# Patient Record
Sex: Male | Born: 1980 | Race: Black or African American | Hispanic: No | Marital: Single | Smoking: Current every day smoker
Health system: Southern US, Community
[De-identification: ages and names within clinical notes are randomized; demographics above are authoritative.]

## PROBLEM LIST (undated history)

## (undated) DIAGNOSIS — K219 Gastro-esophageal reflux disease without esophagitis: Secondary | ICD-10-CM

## (undated) DIAGNOSIS — Z21 Asymptomatic human immunodeficiency virus [HIV] infection status: Secondary | ICD-10-CM

## (undated) DIAGNOSIS — F419 Anxiety disorder, unspecified: Secondary | ICD-10-CM

## (undated) DIAGNOSIS — F32A Depression, unspecified: Secondary | ICD-10-CM

## (undated) DIAGNOSIS — B20 Human immunodeficiency virus [HIV] disease: Secondary | ICD-10-CM

## (undated) DIAGNOSIS — R519 Headache, unspecified: Secondary | ICD-10-CM

## (undated) DIAGNOSIS — F329 Major depressive disorder, single episode, unspecified: Secondary | ICD-10-CM

## (undated) DIAGNOSIS — D649 Anemia, unspecified: Secondary | ICD-10-CM

## (undated) DIAGNOSIS — F1910 Other psychoactive substance abuse, uncomplicated: Secondary | ICD-10-CM

## (undated) HISTORY — DX: Depression, unspecified: F32.A

## (undated) HISTORY — DX: Major depressive disorder, single episode, unspecified: F32.9

## (undated) HISTORY — DX: Headache, unspecified: R51.9

## (undated) HISTORY — DX: Asymptomatic human immunodeficiency virus (hiv) infection status: Z21

## (undated) HISTORY — DX: Other psychoactive substance abuse, uncomplicated: F19.10

## (undated) HISTORY — DX: Gastro-esophageal reflux disease without esophagitis: K21.9

## (undated) HISTORY — DX: Human immunodeficiency virus (HIV) disease: B20

## (undated) HISTORY — DX: Anxiety disorder, unspecified: F41.9

## (undated) HISTORY — DX: Anemia, unspecified: D64.9

## (undated) HISTORY — PX: TOOTH EXTRACTION: SUR596

---
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
## 2003-12-04 ENCOUNTER — Emergency Department (HOSPITAL_COMMUNITY): Admission: EM | Admit: 2003-12-04 | Discharge: 2003-12-05 | Payer: Self-pay | Admitting: Emergency Medicine

## 2003-12-05 IMAGING — CT CT ABDOMEN W/ CM
1 series · 16 of 32 positions shown, 20 images · IV contrast (GASTRO & 100 CC OMNI 300)
Comparison: None.

CLINICAL DATA: Lower abdominal pain.  
 CT OF THE ABDOMEN AND PELVIS WITH CONTRAST ? [DATE], [2S] HOURS
TECHNIQUE: After the intravenous injection of 100 cc Omnipaque 300, 5-mm spiral images were obtained through the abdomen and pelvis.
 CT ABDOMEN

[Series 2: abd pelvis · axial · 0.64mm/px · z∈[-479,-69]mm · 16 of 118 slices shown, 20 images]
[im 8/118  soft-tissue]
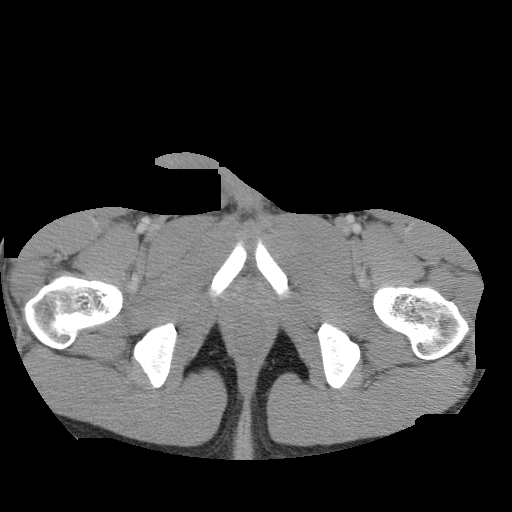
[im 8/118  bone]
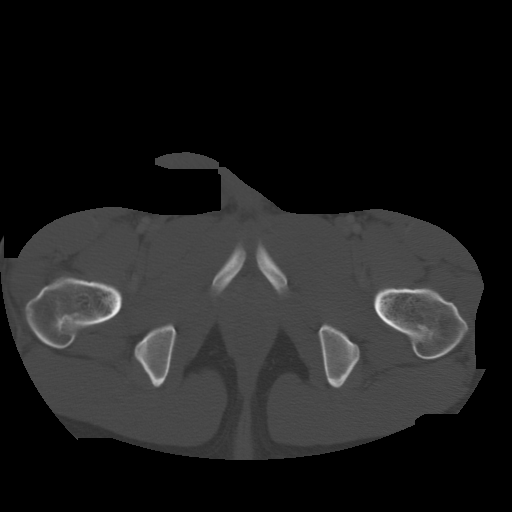
[im 16/118  soft-tissue]
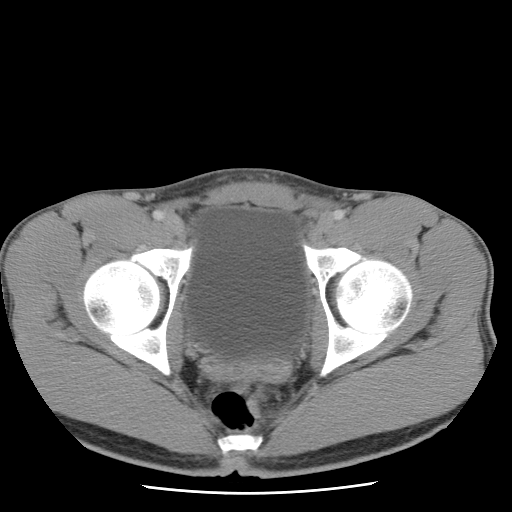
[im 23/118  soft-tissue]
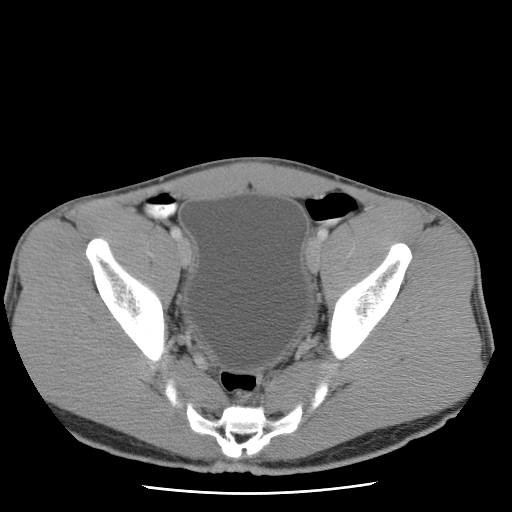
[im 31/118  soft-tissue]
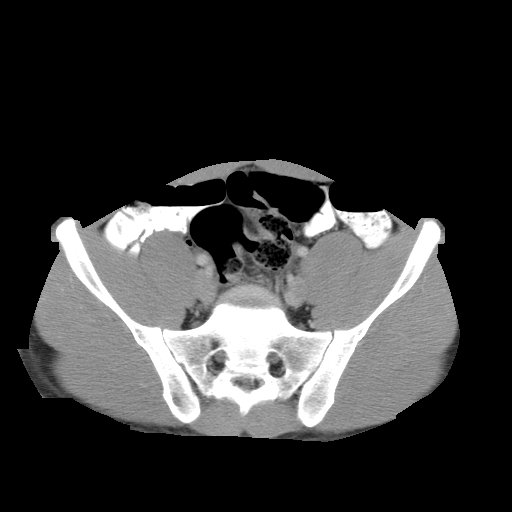
[im 38/118  soft-tissue]
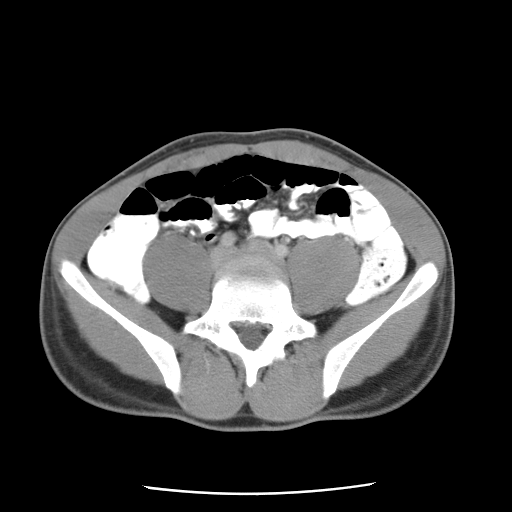
[im 46/118  soft-tissue]
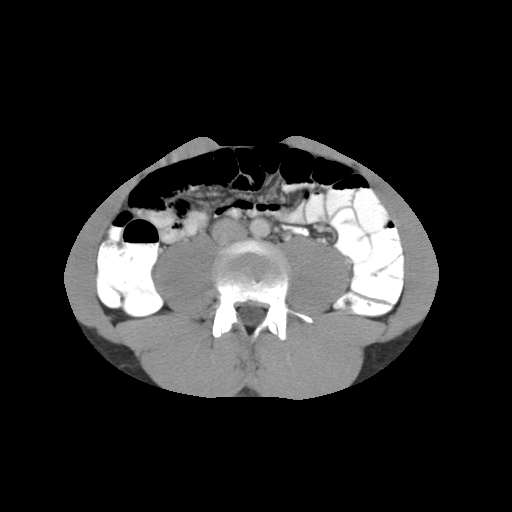
[im 53/118  soft-tissue]
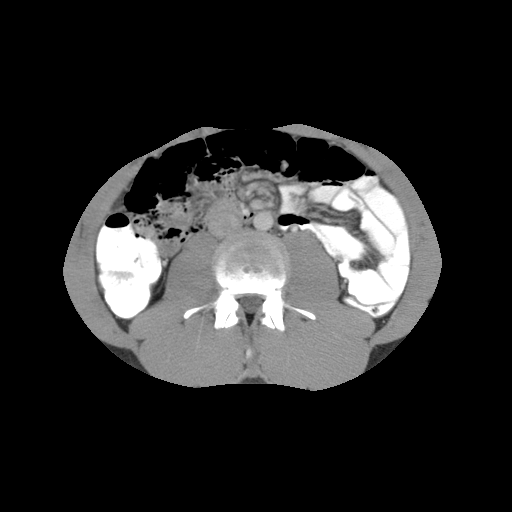
[im 65/118  soft-tissue]
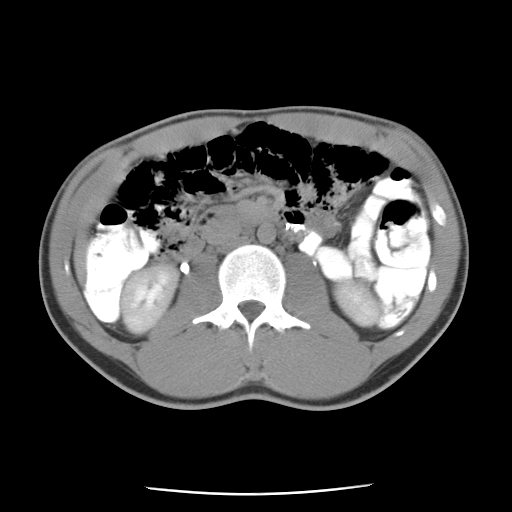
[im 72/118  soft-tissue]
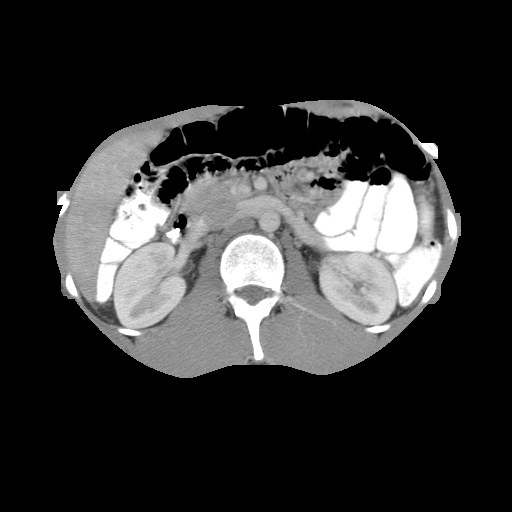
[im 72/118  bone]
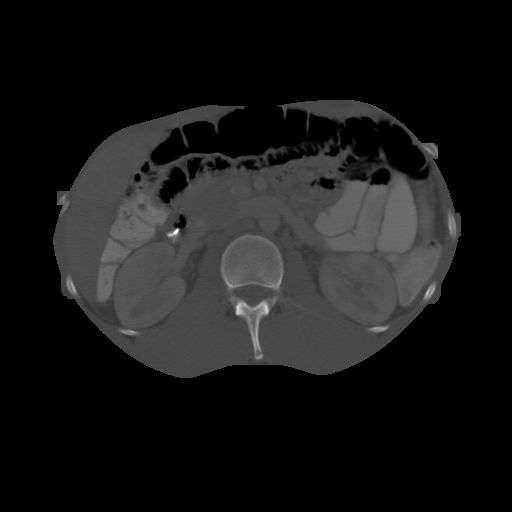
[im 80/118  soft-tissue]
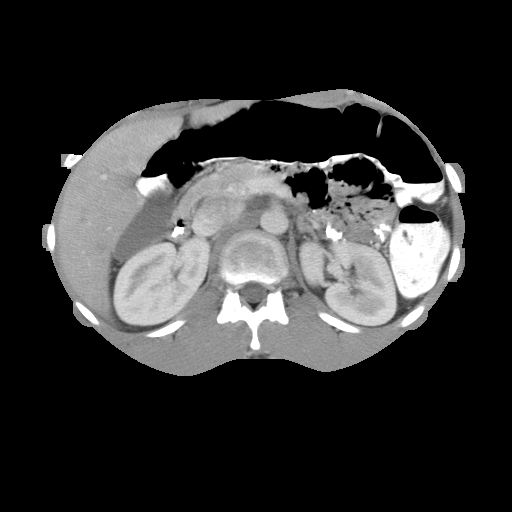
[im 87/118  soft-tissue]
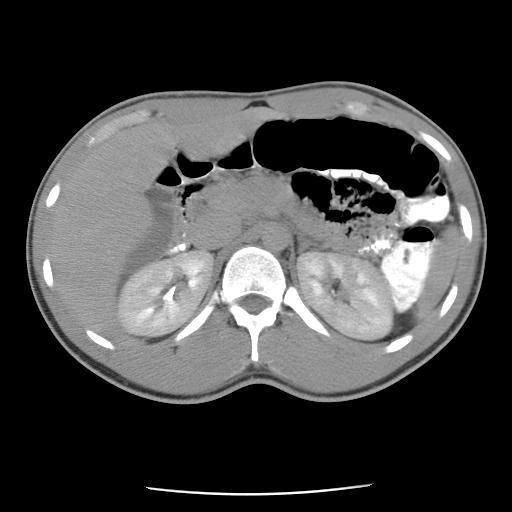
[im 95/118  soft-tissue]
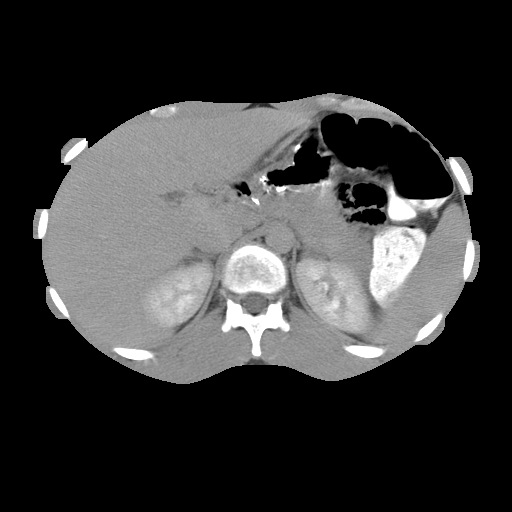
[im 102/118  soft-tissue]
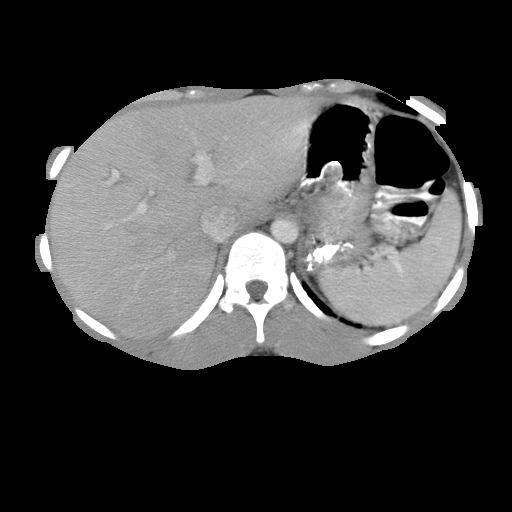
[im 102/118  lung]
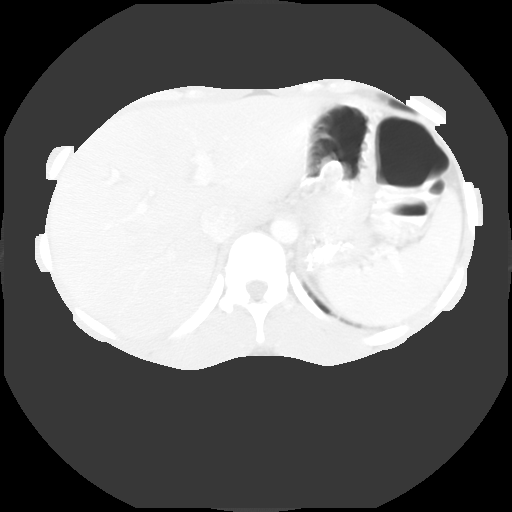
[im 106/118  lung]
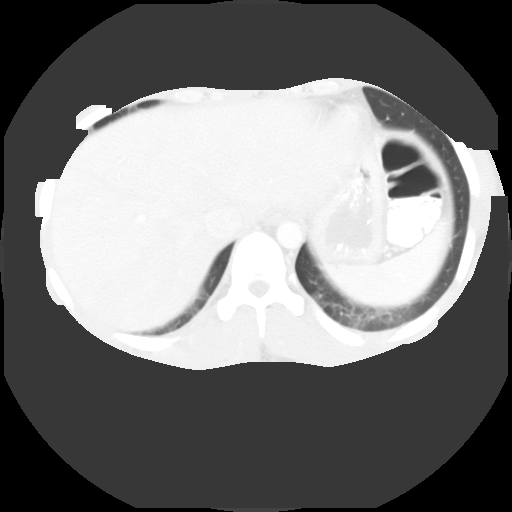
[im 110/118  soft-tissue]
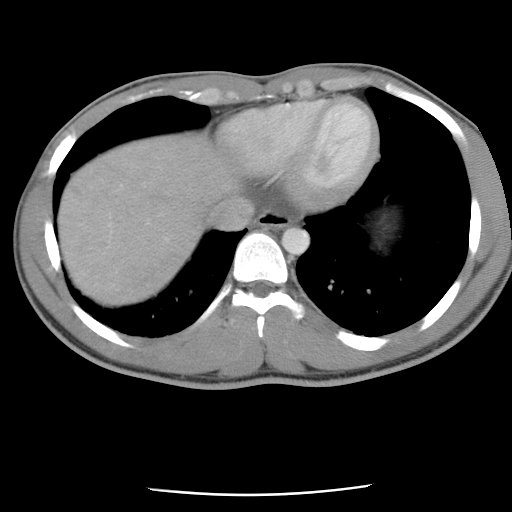
[im 110/118  lung]
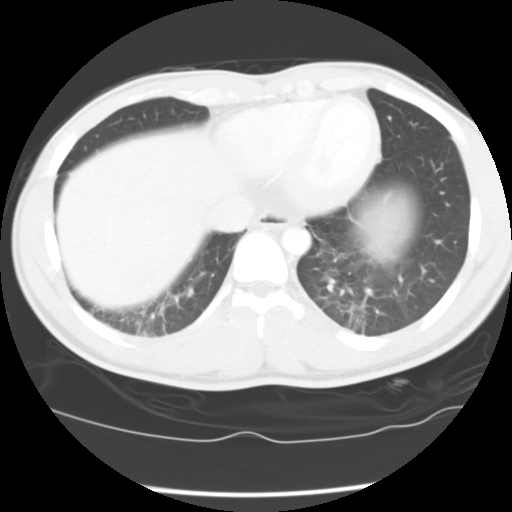
[im 114/118  lung]
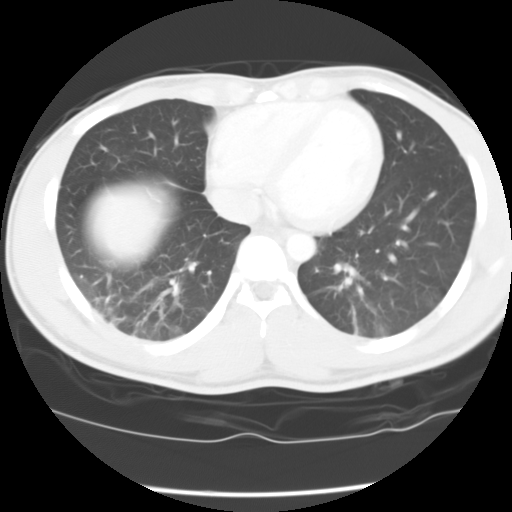

[16 of 32 positions shown; findings below may reference images not displayed]

FINDINGS: The liver, gallbladder, spleen, pancreas, kidneys and adrenal glands are within normal limits.  Mild distention of small and large bowel is present.  The appendix is normal.  Mild stool is noted in the distal descending colon.
IMPRESSION: Ileus.
 CT PELVIS 
 In the pelvis, the bladder is noted to be moderately distended.  A small amount of free fluid is noted in the dependent portion of the pelvis, nonspecific.  Negative abnormal adenopathy.
IMPRESSION: Bladder distention.
 Small amount of free fluid.  Nonspecific.

## 2005-02-03 ENCOUNTER — Emergency Department (HOSPITAL_COMMUNITY): Admission: EM | Admit: 2005-02-03 | Discharge: 2005-02-03 | Payer: Self-pay | Admitting: Emergency Medicine

## 2005-06-06 ENCOUNTER — Emergency Department (HOSPITAL_COMMUNITY): Admission: EM | Admit: 2005-06-06 | Discharge: 2005-06-06 | Payer: Self-pay | Admitting: Family Medicine

## 2006-09-16 ENCOUNTER — Emergency Department (HOSPITAL_COMMUNITY): Admission: EM | Admit: 2006-09-16 | Discharge: 2006-09-16 | Payer: Self-pay | Admitting: Emergency Medicine

## 2006-11-12 ENCOUNTER — Emergency Department (HOSPITAL_COMMUNITY): Admission: EM | Admit: 2006-11-12 | Discharge: 2006-11-12 | Payer: Self-pay | Admitting: Emergency Medicine

## 2007-01-06 ENCOUNTER — Emergency Department (HOSPITAL_COMMUNITY): Admission: EM | Admit: 2007-01-06 | Discharge: 2007-01-06 | Payer: Self-pay | Admitting: Emergency Medicine

## 2007-01-09 ENCOUNTER — Encounter: Payer: Self-pay | Admitting: Internal Medicine

## 2007-01-09 ENCOUNTER — Emergency Department (HOSPITAL_COMMUNITY): Admission: EM | Admit: 2007-01-09 | Discharge: 2007-01-09 | Payer: Self-pay | Admitting: Emergency Medicine

## 2007-02-13 DIAGNOSIS — R599 Enlarged lymph nodes, unspecified: Secondary | ICD-10-CM | POA: Insufficient documentation

## 2007-02-13 DIAGNOSIS — B20 Human immunodeficiency virus [HIV] disease: Secondary | ICD-10-CM | POA: Insufficient documentation

## 2007-02-14 ENCOUNTER — Encounter: Payer: Self-pay | Admitting: Licensed Clinical Social Worker

## 2007-02-14 ENCOUNTER — Encounter: Admission: RE | Admit: 2007-02-14 | Discharge: 2007-02-14 | Payer: Self-pay | Admitting: Internal Medicine

## 2007-02-14 ENCOUNTER — Ambulatory Visit: Payer: Self-pay | Admitting: Internal Medicine

## 2007-02-14 DIAGNOSIS — F341 Dysthymic disorder: Secondary | ICD-10-CM

## 2007-02-14 LAB — CONVERTED CEMR LAB
Basophils Absolute: 0.1 10*3/uL (ref 0.0–0.1)
Basophils Relative: 3 % — ABNORMAL HIGH (ref 0–1)
CD4 T Cell Abs: 210
Chlamydia, Swab/Urine, PCR: NEGATIVE
Eosinophils Absolute: 0.1 10*3/uL (ref 0.0–0.7)
Eosinophils Relative: 2 % (ref 0–5)
GC Probe Amp, Urine: NEGATIVE
HCT: 45 % (ref 39.0–52.0)
HIV 1 RNA Quant: 119000 copies/mL — ABNORMAL HIGH (ref <50)
HIV-1 RNA Quant, Log: 5.08 — ABNORMAL HIGH (ref <1.70)
Hemoglobin: 15 g/dL (ref 13.0–17.0)
Lymphocytes Relative: 48 % — ABNORMAL HIGH (ref 12–46)
Lymphs Abs: 2.4 10*3/uL (ref 0.7–3.3)
MCHC: 33.3 g/dL (ref 30.0–36.0)
MCV: 94.7 fL (ref 78.0–100.0)
Monocytes Absolute: 0.5 10*3/uL (ref 0.2–0.7)
Monocytes Relative: 9 % (ref 3–11)
Neutro Abs: 1.9 10*3/uL (ref 1.7–7.7)
Neutrophils Relative %: 38 % — ABNORMAL LOW (ref 43–77)
Platelets: 267 10*3/uL (ref 150–400)
RBC: 4.75 M/uL (ref 4.22–5.81)
RDW: 14.2 % — ABNORMAL HIGH (ref 11.5–14.0)
WBC: 4.9 10*3/uL (ref 4.0–10.5)

## 2007-02-28 ENCOUNTER — Ambulatory Visit: Payer: Self-pay | Admitting: Internal Medicine

## 2007-02-28 ENCOUNTER — Encounter (INDEPENDENT_AMBULATORY_CARE_PROVIDER_SITE_OTHER): Payer: Self-pay | Admitting: *Deleted

## 2007-03-12 ENCOUNTER — Encounter (INDEPENDENT_AMBULATORY_CARE_PROVIDER_SITE_OTHER): Payer: Self-pay | Admitting: *Deleted

## 2007-03-14 ENCOUNTER — Telehealth (INDEPENDENT_AMBULATORY_CARE_PROVIDER_SITE_OTHER): Payer: Self-pay | Admitting: *Deleted

## 2007-04-10 ENCOUNTER — Telehealth: Payer: Self-pay | Admitting: Internal Medicine

## 2007-04-10 ENCOUNTER — Ambulatory Visit: Payer: Self-pay | Admitting: Internal Medicine

## 2007-04-10 ENCOUNTER — Encounter: Admission: RE | Admit: 2007-04-10 | Discharge: 2007-04-10 | Payer: Self-pay | Admitting: Internal Medicine

## 2007-04-10 LAB — CONVERTED CEMR LAB
ALT: 21 units/L (ref 0–53)
AST: 29 units/L (ref 0–37)
Albumin: 4.6 g/dL (ref 3.5–5.2)
Alkaline Phosphatase: 62 units/L (ref 39–117)
BUN: 8 mg/dL (ref 6–23)
Basophils Absolute: 0 10*3/uL (ref 0.0–0.1)
Basophils Relative: 1 % (ref 0–1)
CO2: 25 meq/L (ref 19–32)
Calcium: 9.8 mg/dL (ref 8.4–10.5)
Chloride: 103 meq/L (ref 96–112)
Creatinine, Ser: 0.98 mg/dL (ref 0.40–1.50)
Eosinophils Absolute: 0.1 10*3/uL (ref 0.0–0.7)
Eosinophils Relative: 2 % (ref 0–5)
Glucose, Bld: 80 mg/dL (ref 70–99)
HCT: 44.9 % (ref 39.0–52.0)
HIV 1 RNA Quant: 158 copies/mL — ABNORMAL HIGH (ref <50)
HIV-1 RNA Quant, Log: 2.2 — ABNORMAL HIGH (ref <1.70)
Hemoglobin: 15.3 g/dL (ref 13.0–17.0)
Lymphocytes Relative: 48 % — ABNORMAL HIGH (ref 12–46)
Lymphs Abs: 1.9 10*3/uL (ref 0.7–3.3)
MCHC: 34.1 g/dL (ref 30.0–36.0)
MCV: 93.7 fL (ref 78.0–100.0)
Monocytes Absolute: 0.4 10*3/uL (ref 0.2–0.7)
Monocytes Relative: 9 % (ref 3–11)
Neutro Abs: 1.6 10*3/uL — ABNORMAL LOW (ref 1.7–7.7)
Neutrophils Relative %: 41 % — ABNORMAL LOW (ref 43–77)
Platelets: 278 10*3/uL (ref 150–400)
Potassium: 4.8 meq/L (ref 3.5–5.3)
RBC: 4.79 M/uL (ref 4.22–5.81)
RDW: 14.8 % — ABNORMAL HIGH (ref 11.5–14.0)
Sodium: 138 meq/L (ref 135–145)
Total Bilirubin: 0.4 mg/dL (ref 0.3–1.2)
Total Protein: 8.6 g/dL — ABNORMAL HIGH (ref 6.0–8.3)
WBC: 4 10*3/uL (ref 4.0–10.5)

## 2007-04-11 ENCOUNTER — Encounter (INDEPENDENT_AMBULATORY_CARE_PROVIDER_SITE_OTHER): Payer: Self-pay | Admitting: *Deleted

## 2007-04-11 ENCOUNTER — Encounter: Payer: Self-pay | Admitting: Internal Medicine

## 2007-04-11 LAB — CONVERTED CEMR LAB: CD4 Count: 260 microliters

## 2007-05-01 ENCOUNTER — Encounter (INDEPENDENT_AMBULATORY_CARE_PROVIDER_SITE_OTHER): Payer: Self-pay | Admitting: *Deleted

## 2007-05-01 ENCOUNTER — Ambulatory Visit: Payer: Self-pay | Admitting: Internal Medicine

## 2007-05-01 DIAGNOSIS — R634 Abnormal weight loss: Secondary | ICD-10-CM

## 2007-05-01 DIAGNOSIS — A630 Anogenital (venereal) warts: Secondary | ICD-10-CM

## 2007-05-01 LAB — CONVERTED CEMR LAB
HCV Ab: NEGATIVE
Hep B S Ab: NEGATIVE
Hepatitis B Surface Ag: NEGATIVE

## 2007-05-03 ENCOUNTER — Telehealth: Payer: Self-pay | Admitting: Internal Medicine

## 2007-05-09 ENCOUNTER — Telehealth: Payer: Self-pay | Admitting: Internal Medicine

## 2007-05-24 ENCOUNTER — Encounter: Payer: Self-pay | Admitting: Internal Medicine

## 2007-05-31 ENCOUNTER — Telehealth: Payer: Self-pay | Admitting: Internal Medicine

## 2007-06-07 ENCOUNTER — Telehealth: Payer: Self-pay | Admitting: Internal Medicine

## 2007-06-14 ENCOUNTER — Telehealth: Payer: Self-pay | Admitting: Internal Medicine

## 2007-06-27 ENCOUNTER — Telehealth: Payer: Self-pay

## 2007-07-03 ENCOUNTER — Telehealth: Payer: Self-pay | Admitting: Internal Medicine

## 2007-07-04 ENCOUNTER — Telehealth: Payer: Self-pay

## 2007-07-04 ENCOUNTER — Telehealth: Payer: Self-pay | Admitting: Internal Medicine

## 2007-07-10 ENCOUNTER — Emergency Department (HOSPITAL_COMMUNITY): Admission: EM | Admit: 2007-07-10 | Discharge: 2007-07-10 | Payer: Self-pay | Admitting: Emergency Medicine

## 2007-07-27 ENCOUNTER — Encounter: Payer: Self-pay | Admitting: Internal Medicine

## 2007-07-27 ENCOUNTER — Telehealth: Payer: Self-pay | Admitting: Internal Medicine

## 2007-08-06 ENCOUNTER — Telehealth: Payer: Self-pay | Admitting: Internal Medicine

## 2007-08-07 ENCOUNTER — Encounter: Admission: RE | Admit: 2007-08-07 | Discharge: 2007-08-07 | Payer: Self-pay | Admitting: Internal Medicine

## 2007-08-07 ENCOUNTER — Ambulatory Visit: Payer: Self-pay | Admitting: Internal Medicine

## 2007-08-07 LAB — CONVERTED CEMR LAB
ALT: 10 units/L (ref 0–53)
AST: 16 units/L (ref 0–37)
Albumin: 4.2 g/dL (ref 3.5–5.2)
Alkaline Phosphatase: 63 units/L (ref 39–117)
BUN: 4 mg/dL — ABNORMAL LOW (ref 6–23)
Basophils Absolute: 0 10*3/uL (ref 0.0–0.1)
Basophils Relative: 1 % (ref 0–1)
CO2: 24 meq/L (ref 19–32)
Calcium: 9.2 mg/dL (ref 8.4–10.5)
Chloride: 105 meq/L (ref 96–112)
Creatinine, Ser: 1.05 mg/dL (ref 0.40–1.50)
Eosinophils Absolute: 0.2 10*3/uL (ref 0.2–0.7)
Eosinophils Relative: 5 % (ref 0–5)
Glucose, Bld: 100 mg/dL — ABNORMAL HIGH (ref 70–99)
HCT: 43.3 % (ref 39.0–52.0)
HIV 1 RNA Quant: 57 copies/mL — ABNORMAL HIGH (ref <50)
HIV-1 RNA Quant, Log: 1.76 — ABNORMAL HIGH (ref <1.70)
Hemoglobin: 14.8 g/dL (ref 13.0–17.0)
Lymphocytes Relative: 48 % — ABNORMAL HIGH (ref 12–46)
Lymphs Abs: 2 10*3/uL (ref 0.7–4.0)
MCHC: 34.2 g/dL (ref 30.0–36.0)
MCV: 101.6 fL — ABNORMAL HIGH (ref 78.0–100.0)
Monocytes Absolute: 0.4 10*3/uL (ref 0.1–1.0)
Monocytes Relative: 9 % (ref 3–12)
Neutro Abs: 1.6 10*3/uL — ABNORMAL LOW (ref 1.7–7.7)
Neutrophils Relative %: 38 % — ABNORMAL LOW (ref 43–77)
Platelets: 280 10*3/uL (ref 150–400)
Potassium: 4.5 meq/L (ref 3.5–5.3)
RBC: 4.26 M/uL (ref 4.22–5.81)
RDW: 14.1 % (ref 11.5–15.5)
Sodium: 137 meq/L (ref 135–145)
Total Bilirubin: 0.3 mg/dL (ref 0.3–1.2)
Total Protein: 7.5 g/dL (ref 6.0–8.3)
WBC: 4.2 10*3/uL (ref 4.0–10.5)

## 2007-08-13 ENCOUNTER — Telehealth: Payer: Self-pay | Admitting: Internal Medicine

## 2007-08-22 ENCOUNTER — Ambulatory Visit: Payer: Self-pay | Admitting: Internal Medicine

## 2007-08-22 DIAGNOSIS — S335XXA Sprain of ligaments of lumbar spine, initial encounter: Secondary | ICD-10-CM

## 2007-08-29 ENCOUNTER — Telehealth: Payer: Self-pay | Admitting: Internal Medicine

## 2007-09-24 ENCOUNTER — Encounter: Payer: Self-pay | Admitting: Internal Medicine

## 2007-09-26 ENCOUNTER — Encounter (INDEPENDENT_AMBULATORY_CARE_PROVIDER_SITE_OTHER): Payer: Self-pay | Admitting: *Deleted

## 2007-10-05 ENCOUNTER — Telehealth: Payer: Self-pay | Admitting: Internal Medicine

## 2007-11-01 ENCOUNTER — Telehealth: Payer: Self-pay | Admitting: Internal Medicine

## 2007-11-02 ENCOUNTER — Encounter: Admission: RE | Admit: 2007-11-02 | Discharge: 2007-11-02 | Payer: Self-pay | Admitting: Internal Medicine

## 2007-11-02 ENCOUNTER — Ambulatory Visit: Payer: Self-pay | Admitting: Internal Medicine

## 2007-11-02 ENCOUNTER — Telehealth: Payer: Self-pay | Admitting: Internal Medicine

## 2007-11-02 ENCOUNTER — Encounter (INDEPENDENT_AMBULATORY_CARE_PROVIDER_SITE_OTHER): Payer: Self-pay | Admitting: *Deleted

## 2007-11-02 LAB — CONVERTED CEMR LAB
ALT: 9 units/L (ref 0–53)
AST: 13 units/L (ref 0–37)
Albumin: 4.5 g/dL (ref 3.5–5.2)
Alkaline Phosphatase: 77 units/L (ref 39–117)
BUN: 6 mg/dL (ref 6–23)
Basophils Absolute: 0 10*3/uL (ref 0.0–0.1)
Basophils Relative: 0 % (ref 0–1)
CO2: 25 meq/L (ref 19–32)
Calcium: 9.6 mg/dL (ref 8.4–10.5)
Chloride: 104 meq/L (ref 96–112)
Creatinine, Ser: 0.96 mg/dL (ref 0.40–1.50)
Eosinophils Absolute: 0.3 10*3/uL (ref 0.0–0.7)
Eosinophils Relative: 4 % (ref 0–5)
Glucose, Bld: 102 mg/dL — ABNORMAL HIGH (ref 70–99)
HCT: 46.4 % (ref 39.0–52.0)
HIV 1 RNA Quant: 193 copies/mL — ABNORMAL HIGH (ref <50)
HIV-1 RNA Quant, Log: 2.29 — ABNORMAL HIGH (ref <1.70)
Hemoglobin: 16 g/dL (ref 13.0–17.0)
Lymphocytes Relative: 31 % (ref 12–46)
Lymphs Abs: 1.8 10*3/uL (ref 0.7–4.0)
MCHC: 34.5 g/dL (ref 30.0–36.0)
MCV: 100.9 fL — ABNORMAL HIGH (ref 78.0–100.0)
Monocytes Absolute: 0.3 10*3/uL (ref 0.1–1.0)
Monocytes Relative: 5 % (ref 3–12)
Neutro Abs: 3.6 10*3/uL (ref 1.7–7.7)
Neutrophils Relative %: 59 % (ref 43–77)
Platelets: 341 10*3/uL (ref 150–400)
Potassium: 4.6 meq/L (ref 3.5–5.3)
RBC: 4.6 M/uL (ref 4.22–5.81)
RDW: 14.4 % (ref 11.5–15.5)
Sodium: 140 meq/L (ref 135–145)
Total Bilirubin: 0.3 mg/dL (ref 0.3–1.2)
Total Protein: 7.9 g/dL (ref 6.0–8.3)
WBC: 6 10*3/uL (ref 4.0–10.5)

## 2007-11-13 ENCOUNTER — Encounter (INDEPENDENT_AMBULATORY_CARE_PROVIDER_SITE_OTHER): Payer: Self-pay | Admitting: *Deleted

## 2007-11-14 ENCOUNTER — Ambulatory Visit: Payer: Self-pay | Admitting: Internal Medicine

## 2007-11-14 DIAGNOSIS — F17200 Nicotine dependence, unspecified, uncomplicated: Secondary | ICD-10-CM | POA: Insufficient documentation

## 2007-11-20 ENCOUNTER — Telehealth: Payer: Self-pay | Admitting: Internal Medicine

## 2007-11-20 ENCOUNTER — Telehealth (INDEPENDENT_AMBULATORY_CARE_PROVIDER_SITE_OTHER): Payer: Self-pay | Admitting: *Deleted

## 2007-11-28 ENCOUNTER — Telehealth: Payer: Self-pay | Admitting: Internal Medicine

## 2007-12-17 ENCOUNTER — Telehealth: Payer: Self-pay | Admitting: Internal Medicine

## 2007-12-17 ENCOUNTER — Emergency Department (HOSPITAL_COMMUNITY): Admission: EM | Admit: 2007-12-17 | Discharge: 2007-12-17 | Payer: Self-pay | Admitting: Emergency Medicine

## 2007-12-28 ENCOUNTER — Telehealth (INDEPENDENT_AMBULATORY_CARE_PROVIDER_SITE_OTHER): Payer: Self-pay | Admitting: *Deleted

## 2008-01-29 ENCOUNTER — Telehealth (INDEPENDENT_AMBULATORY_CARE_PROVIDER_SITE_OTHER): Payer: Self-pay | Admitting: *Deleted

## 2008-01-30 ENCOUNTER — Ambulatory Visit: Payer: Self-pay | Admitting: Internal Medicine

## 2008-01-30 ENCOUNTER — Encounter: Admission: RE | Admit: 2008-01-30 | Discharge: 2008-01-30 | Payer: Self-pay | Admitting: Internal Medicine

## 2008-01-30 LAB — CONVERTED CEMR LAB
ALT: <8 units/L (ref 0–53)
AST: 11 units/L (ref 0–37)
Albumin: 4 g/dL (ref 3.5–5.2)
Alkaline Phosphatase: 85 units/L (ref 39–117)
BUN: 6 mg/dL (ref 6–23)
Basophils Absolute: 0 10*3/uL (ref 0.0–0.1)
Basophils Relative: 0 % (ref 0–1)
CO2: 21 meq/L (ref 19–32)
Calcium: 8.9 mg/dL (ref 8.4–10.5)
Chloride: 104 meq/L (ref 96–112)
Creatinine, Ser: 0.92 mg/dL (ref 0.40–1.50)
Eosinophils Absolute: 0.3 10*3/uL (ref 0.0–0.7)
Eosinophils Relative: 4 % (ref 0–5)
Glucose, Bld: 94 mg/dL (ref 70–99)
HCT: 42.1 % (ref 39.0–52.0)
HIV 1 RNA Quant: <50 copies/mL (ref <50)
HIV-1 RNA Quant, Log: <1.7 (ref <1.70)
Hemoglobin: 14.2 g/dL (ref 13.0–17.0)
Lymphocytes Relative: 26 % (ref 12–46)
Lymphs Abs: 2 10*3/uL (ref 0.7–4.0)
MCHC: 33.7 g/dL (ref 30.0–36.0)
MCV: 100.7 fL — ABNORMAL HIGH (ref 78.0–100.0)
Monocytes Absolute: 0.5 10*3/uL (ref 0.1–1.0)
Monocytes Relative: 6 % (ref 3–12)
Neutro Abs: 4.9 10*3/uL (ref 1.7–7.7)
Neutrophils Relative %: 63 % (ref 43–77)
Platelets: 350 10*3/uL (ref 150–400)
Potassium: 4.3 meq/L (ref 3.5–5.3)
RBC: 4.18 M/uL — ABNORMAL LOW (ref 4.22–5.81)
RDW: 14.5 % (ref 11.5–15.5)
Sodium: 139 meq/L (ref 135–145)
Total Bilirubin: 0.3 mg/dL (ref 0.3–1.2)
Total Protein: 6.9 g/dL (ref 6.0–8.3)
WBC: 7.7 10*3/uL (ref 4.0–10.5)

## 2008-02-13 ENCOUNTER — Ambulatory Visit: Payer: Self-pay | Admitting: Internal Medicine

## 2008-02-13 DIAGNOSIS — K029 Dental caries, unspecified: Secondary | ICD-10-CM | POA: Insufficient documentation

## 2008-02-14 ENCOUNTER — Telehealth (INDEPENDENT_AMBULATORY_CARE_PROVIDER_SITE_OTHER): Payer: Self-pay | Admitting: *Deleted

## 2008-02-27 ENCOUNTER — Telehealth (INDEPENDENT_AMBULATORY_CARE_PROVIDER_SITE_OTHER): Payer: Self-pay | Admitting: *Deleted

## 2008-03-13 ENCOUNTER — Encounter (INDEPENDENT_AMBULATORY_CARE_PROVIDER_SITE_OTHER): Payer: Self-pay | Admitting: *Deleted

## 2008-03-25 ENCOUNTER — Telehealth (INDEPENDENT_AMBULATORY_CARE_PROVIDER_SITE_OTHER): Payer: Self-pay | Admitting: *Deleted

## 2008-04-25 ENCOUNTER — Telehealth: Payer: Self-pay | Admitting: Internal Medicine

## 2008-05-13 ENCOUNTER — Encounter: Admission: RE | Admit: 2008-05-13 | Discharge: 2008-05-13 | Payer: Self-pay | Admitting: Internal Medicine

## 2008-05-13 ENCOUNTER — Ambulatory Visit: Payer: Self-pay | Admitting: Internal Medicine

## 2008-05-13 LAB — CONVERTED CEMR LAB
ALT: 12 units/L (ref 0–53)
AST: 15 units/L (ref 0–37)
Albumin: 4.7 g/dL (ref 3.5–5.2)
Alkaline Phosphatase: 68 units/L (ref 39–117)
BUN: 8 mg/dL (ref 6–23)
Basophils Absolute: 0 10*3/uL (ref 0.0–0.1)
Basophils Relative: 1 % (ref 0–1)
CO2: 23 meq/L (ref 19–32)
Calcium: 10.2 mg/dL (ref 8.4–10.5)
Chloride: 104 meq/L (ref 96–112)
Creatinine, Ser: 0.9 mg/dL (ref 0.40–1.50)
Eosinophils Absolute: 0.2 10*3/uL (ref 0.0–0.7)
Eosinophils Relative: 5 % (ref 0–5)
Glucose, Bld: 108 mg/dL — ABNORMAL HIGH (ref 70–99)
HCT: 45.9 % (ref 39.0–52.0)
HIV 1 RNA Quant: <50 copies/mL (ref <50)
HIV-1 RNA Quant, Log: <1.7 (ref <1.70)
Hemoglobin: 15.1 g/dL (ref 13.0–17.0)
Lymphocytes Relative: 50 % — ABNORMAL HIGH (ref 12–46)
Lymphs Abs: 2 10*3/uL (ref 0.7–4.0)
MCHC: 32.9 g/dL (ref 30.0–36.0)
MCV: 102 fL — ABNORMAL HIGH (ref 78.0–100.0)
Monocytes Absolute: 0.3 10*3/uL (ref 0.1–1.0)
Monocytes Relative: 8 % (ref 3–12)
Neutro Abs: 1.4 10*3/uL — ABNORMAL LOW (ref 1.7–7.7)
Neutrophils Relative %: 37 % — ABNORMAL LOW (ref 43–77)
Platelets: 342 10*3/uL (ref 150–400)
Potassium: 4.6 meq/L (ref 3.5–5.3)
RBC: 4.5 M/uL (ref 4.22–5.81)
RDW: 14.6 % (ref 11.5–15.5)
Sodium: 139 meq/L (ref 135–145)
Total Bilirubin: 0.3 mg/dL (ref 0.3–1.2)
Total Protein: 7.7 g/dL (ref 6.0–8.3)
WBC: 3.9 10*3/uL — ABNORMAL LOW (ref 4.0–10.5)

## 2008-05-28 ENCOUNTER — Ambulatory Visit: Payer: Self-pay | Admitting: Internal Medicine

## 2008-05-28 DIAGNOSIS — K219 Gastro-esophageal reflux disease without esophagitis: Secondary | ICD-10-CM | POA: Insufficient documentation

## 2008-05-28 DIAGNOSIS — B369 Superficial mycosis, unspecified: Secondary | ICD-10-CM | POA: Insufficient documentation

## 2008-05-30 ENCOUNTER — Telehealth (INDEPENDENT_AMBULATORY_CARE_PROVIDER_SITE_OTHER): Payer: Self-pay | Admitting: *Deleted

## 2008-06-27 ENCOUNTER — Telehealth (INDEPENDENT_AMBULATORY_CARE_PROVIDER_SITE_OTHER): Payer: Self-pay | Admitting: *Deleted

## 2008-07-25 ENCOUNTER — Telehealth (INDEPENDENT_AMBULATORY_CARE_PROVIDER_SITE_OTHER): Payer: Self-pay | Admitting: *Deleted

## 2008-08-28 ENCOUNTER — Telehealth (INDEPENDENT_AMBULATORY_CARE_PROVIDER_SITE_OTHER): Payer: Self-pay | Admitting: *Deleted

## 2008-09-10 ENCOUNTER — Ambulatory Visit: Payer: Self-pay | Admitting: Internal Medicine

## 2008-09-10 LAB — CONVERTED CEMR LAB
ALT: 10 units/L (ref 0–53)
AST: 14 units/L (ref 0–37)
Albumin: 5.1 g/dL (ref 3.5–5.2)
Alkaline Phosphatase: 81 units/L (ref 39–117)
BUN: 8 mg/dL (ref 6–23)
Basophils Absolute: 0 10*3/uL (ref 0.0–0.1)
Basophils Relative: 0 % (ref 0–1)
CO2: 24 meq/L (ref 19–32)
Calcium: 9.8 mg/dL (ref 8.4–10.5)
Chloride: 102 meq/L (ref 96–112)
Creatinine, Ser: 0.92 mg/dL (ref 0.40–1.50)
Eosinophils Absolute: 0.1 10*3/uL (ref 0.0–0.7)
Eosinophils Relative: 3 % (ref 0–5)
Glucose, Bld: 105 mg/dL — ABNORMAL HIGH (ref 70–99)
HCT: 45.8 % (ref 39.0–52.0)
HIV 1 RNA Quant: <48 copies/mL (ref <48)
HIV-1 RNA Quant, Log: <1.68 (ref <1.68)
Hemoglobin: 15.3 g/dL (ref 13.0–17.0)
Lymphocytes Relative: 32 % (ref 12–46)
Lymphs Abs: 1.5 10*3/uL (ref 0.7–4.0)
MCHC: 33.4 g/dL (ref 30.0–36.0)
MCV: 100.2 fL — ABNORMAL HIGH (ref 78.0–100.0)
Monocytes Absolute: 0.5 10*3/uL (ref 0.1–1.0)
Monocytes Relative: 10 % (ref 3–12)
Neutro Abs: 2.6 10*3/uL (ref 1.7–7.7)
Neutrophils Relative %: 55 % (ref 43–77)
Platelets: 353 10*3/uL (ref 150–400)
Potassium: 4.4 meq/L (ref 3.5–5.3)
RBC: 4.57 M/uL (ref 4.22–5.81)
RDW: 13.9 % (ref 11.5–15.5)
Sodium: 139 meq/L (ref 135–145)
Total Bilirubin: 0.4 mg/dL (ref 0.3–1.2)
Total Protein: 8.4 g/dL — ABNORMAL HIGH (ref 6.0–8.3)
WBC: 4.7 10*3/uL (ref 4.0–10.5)

## 2008-09-24 ENCOUNTER — Telehealth (INDEPENDENT_AMBULATORY_CARE_PROVIDER_SITE_OTHER): Payer: Self-pay | Admitting: *Deleted

## 2008-10-01 ENCOUNTER — Ambulatory Visit: Payer: Self-pay | Admitting: Internal Medicine

## 2008-10-22 ENCOUNTER — Telehealth (INDEPENDENT_AMBULATORY_CARE_PROVIDER_SITE_OTHER): Payer: Self-pay | Admitting: *Deleted

## 2008-11-20 ENCOUNTER — Telehealth (INDEPENDENT_AMBULATORY_CARE_PROVIDER_SITE_OTHER): Payer: Self-pay | Admitting: *Deleted

## 2008-12-11 ENCOUNTER — Encounter (INDEPENDENT_AMBULATORY_CARE_PROVIDER_SITE_OTHER): Payer: Self-pay | Admitting: *Deleted

## 2008-12-17 ENCOUNTER — Telehealth (INDEPENDENT_AMBULATORY_CARE_PROVIDER_SITE_OTHER): Payer: Self-pay | Admitting: *Deleted

## 2008-12-30 ENCOUNTER — Ambulatory Visit: Payer: Self-pay | Admitting: Internal Medicine

## 2008-12-30 LAB — CONVERTED CEMR LAB
ALT: <8 units/L (ref 0–53)
AST: 13 units/L (ref 0–37)
Albumin: 4.6 g/dL (ref 3.5–5.2)
Alkaline Phosphatase: 79 units/L (ref 39–117)
BUN: 10 mg/dL (ref 6–23)
Basophils Absolute: 0 10*3/uL (ref 0.0–0.1)
Basophils Relative: 0 % (ref 0–1)
CO2: 24 meq/L (ref 19–32)
Calcium: 9.3 mg/dL (ref 8.4–10.5)
Chloride: 105 meq/L (ref 96–112)
Creatinine, Ser: 1.03 mg/dL (ref 0.40–1.50)
Eosinophils Absolute: 0.1 10*3/uL (ref 0.0–0.7)
Eosinophils Relative: 3 % (ref 0–5)
GFR calc Af Amer: >60 mL/min (ref >60)
GFR calc non Af Amer: >60 mL/min (ref >60)
Glucose, Bld: 68 mg/dL — ABNORMAL LOW (ref 70–99)
HCT: 42.9 % (ref 39.0–52.0)
HIV 1 RNA Quant: <48 copies/mL (ref <48)
HIV-1 RNA Quant, Log: <1.68 (ref <1.68)
Hemoglobin: 14.8 g/dL (ref 13.0–17.0)
Lymphocytes Relative: 49 % — ABNORMAL HIGH (ref 12–46)
Lymphs Abs: 2.4 10*3/uL (ref 0.7–4.0)
MCHC: 34.5 g/dL (ref 30.0–36.0)
MCV: 98.4 fL (ref 78.0–100.0)
Monocytes Absolute: 0.4 10*3/uL (ref 0.1–1.0)
Monocytes Relative: 7 % (ref 3–12)
Neutro Abs: 2 10*3/uL (ref 1.7–7.7)
Neutrophils Relative %: 41 % — ABNORMAL LOW (ref 43–77)
Platelets: 305 10*3/uL (ref 150–400)
Potassium: 4.2 meq/L (ref 3.5–5.3)
RBC: 4.36 M/uL (ref 4.22–5.81)
RDW: 14.1 % (ref 11.5–15.5)
Sodium: 140 meq/L (ref 135–145)
Total Bilirubin: 0.3 mg/dL (ref 0.3–1.2)
Total Protein: 7.5 g/dL (ref 6.0–8.3)
WBC: 4.9 10*3/uL (ref 4.0–10.5)

## 2009-01-16 ENCOUNTER — Telehealth (INDEPENDENT_AMBULATORY_CARE_PROVIDER_SITE_OTHER): Payer: Self-pay | Admitting: *Deleted

## 2009-02-18 ENCOUNTER — Telehealth (INDEPENDENT_AMBULATORY_CARE_PROVIDER_SITE_OTHER): Payer: Self-pay | Admitting: *Deleted

## 2009-03-19 ENCOUNTER — Telehealth (INDEPENDENT_AMBULATORY_CARE_PROVIDER_SITE_OTHER): Payer: Self-pay | Admitting: *Deleted

## 2009-04-14 ENCOUNTER — Telehealth (INDEPENDENT_AMBULATORY_CARE_PROVIDER_SITE_OTHER): Payer: Self-pay | Admitting: *Deleted

## 2009-04-21 ENCOUNTER — Telehealth (INDEPENDENT_AMBULATORY_CARE_PROVIDER_SITE_OTHER): Payer: Self-pay | Admitting: *Deleted

## 2009-05-15 ENCOUNTER — Telehealth (INDEPENDENT_AMBULATORY_CARE_PROVIDER_SITE_OTHER): Payer: Self-pay | Admitting: *Deleted

## 2009-06-12 ENCOUNTER — Telehealth (INDEPENDENT_AMBULATORY_CARE_PROVIDER_SITE_OTHER): Payer: Self-pay | Admitting: *Deleted

## 2009-06-19 ENCOUNTER — Telehealth: Payer: Self-pay

## 2009-06-19 ENCOUNTER — Ambulatory Visit: Payer: Self-pay | Admitting: Internal Medicine

## 2009-06-19 LAB — CONVERTED CEMR LAB
ALT: 9 units/L (ref 0–53)
AST: 14 units/L (ref 0–37)
Albumin: 4.4 g/dL (ref 3.5–5.2)
Alkaline Phosphatase: 75 units/L (ref 39–117)
BUN: 9 mg/dL (ref 6–23)
Basophils Absolute: 0 10*3/uL (ref 0.0–0.1)
Basophils Relative: 0 % (ref 0–1)
CO2: 26 meq/L (ref 19–32)
Calcium: 9.2 mg/dL (ref 8.4–10.5)
Chloride: 105 meq/L (ref 96–112)
Creatinine, Ser: 1.02 mg/dL (ref 0.40–1.50)
Eosinophils Absolute: 0.1 10*3/uL (ref 0.0–0.7)
Eosinophils Relative: 2 % (ref 0–5)
Glucose, Bld: 91 mg/dL (ref 70–99)
HCT: 44.8 % (ref 39.0–52.0)
HIV 1 RNA Quant: <48 copies/mL (ref <48)
HIV-1 RNA Quant, Log: <1.68 (ref <1.68)
Hemoglobin: 15.2 g/dL (ref 13.0–17.0)
Lymphocytes Relative: 36 % (ref 12–46)
Lymphs Abs: 1.8 10*3/uL (ref 0.7–4.0)
MCHC: 33.9 g/dL (ref 30.0–36.0)
MCV: 99.3 fL (ref >=78.0)
Monocytes Absolute: 0.6 10*3/uL (ref 0.1–1.0)
Monocytes Relative: 11 % (ref 3–12)
Neutro Abs: 2.5 10*3/uL (ref 1.7–7.7)
Neutrophils Relative %: 50 % (ref 43–77)
Platelets: 332 10*3/uL (ref 150–400)
Potassium: 4.1 meq/L (ref 3.5–5.3)
RBC: 4.51 M/uL (ref 4.22–5.81)
RDW: 13.9 % (ref 11.5–15.5)
Sodium: 143 meq/L (ref 135–145)
Total Bilirubin: 0.3 mg/dL (ref 0.3–1.2)
Total Protein: 7.3 g/dL (ref 6.0–8.3)
WBC: 5 10*3/uL (ref 4.0–10.5)

## 2009-06-23 ENCOUNTER — Ambulatory Visit: Payer: Self-pay | Admitting: Infectious Disease

## 2009-06-23 ENCOUNTER — Ambulatory Visit (HOSPITAL_COMMUNITY): Admission: RE | Admit: 2009-06-23 | Discharge: 2009-06-23 | Payer: Self-pay | Admitting: Infectious Disease

## 2009-06-23 DIAGNOSIS — R071 Chest pain on breathing: Secondary | ICD-10-CM | POA: Insufficient documentation

## 2009-06-23 DIAGNOSIS — R05 Cough: Secondary | ICD-10-CM | POA: Insufficient documentation

## 2009-06-23 LAB — CONVERTED CEMR LAB
BUN: 3 mg/dL — ABNORMAL LOW (ref 6–23)
Basophils Absolute: 0.1 10*3/uL (ref 0.0–0.1)
Basophils Relative: 1 % (ref 0–1)
CO2: 31 meq/L (ref 19–32)
Calcium: 9.4 mg/dL (ref 8.4–10.5)
Chloride: 106 meq/L (ref 96–112)
Creatinine, Ser: 0.92 mg/dL (ref 0.40–1.50)
Eosinophils Absolute: 0.1 10*3/uL (ref 0.0–0.7)
Eosinophils Relative: 2 % (ref 0–5)
Glucose, Bld: 103 mg/dL — ABNORMAL HIGH (ref 70–99)
HCT: 46.8 % (ref 39.0–52.0)
Hemoglobin: 15.7 g/dL (ref 13.0–17.0)
Lymphocytes Relative: 30 % (ref 12–46)
Lymphs Abs: 2.1 10*3/uL (ref 0.7–4.0)
MCHC: 33.6 g/dL (ref 30.0–36.0)
MCV: 103.4 fL — ABNORMAL HIGH (ref >=78.0)
Monocytes Absolute: 0.3 10*3/uL (ref 0.1–1.0)
Monocytes Relative: 5 % (ref 3–12)
Neutro Abs: 4.3 10*3/uL (ref 1.7–7.7)
Neutrophils Relative %: 62 % (ref 43–77)
Platelets: 345 10*3/uL (ref 150–400)
Potassium: 4.3 meq/L (ref 3.5–5.3)
RBC: 4.53 M/uL (ref 4.22–5.81)
RDW: 14.7 % (ref 11.5–15.5)
Sodium: 141 meq/L (ref 135–145)
WBC: 6.9 10*3/uL (ref 4.0–10.5)

## 2009-06-23 IMAGING — CR DG CHEST 2V
2 series · 2 of 2 positions shown · non-contrast
Comparison: None

CLINICAL DATA: Cough and chest pain.

CHEST - 2 VIEW

[w chest pa]
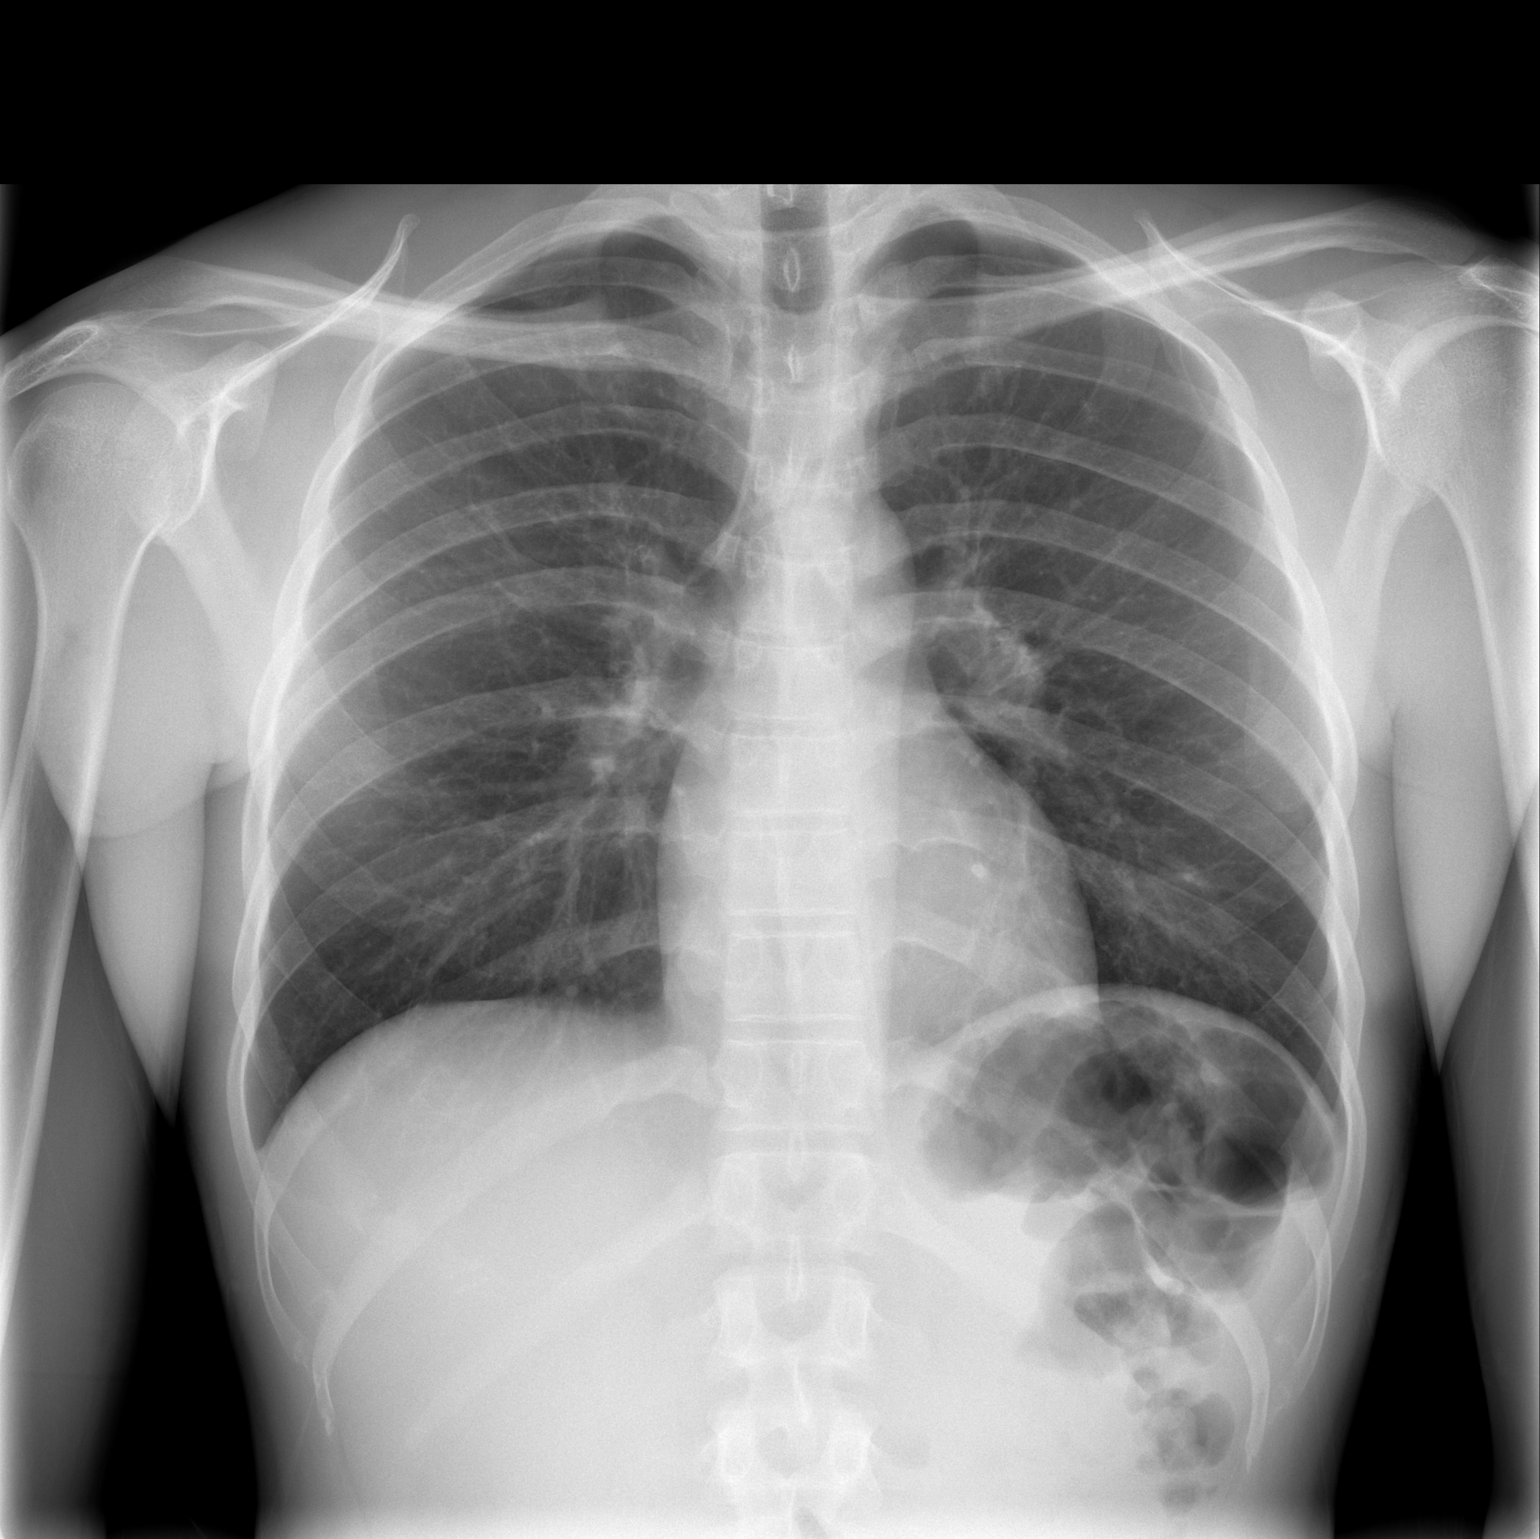

[w chest lat]
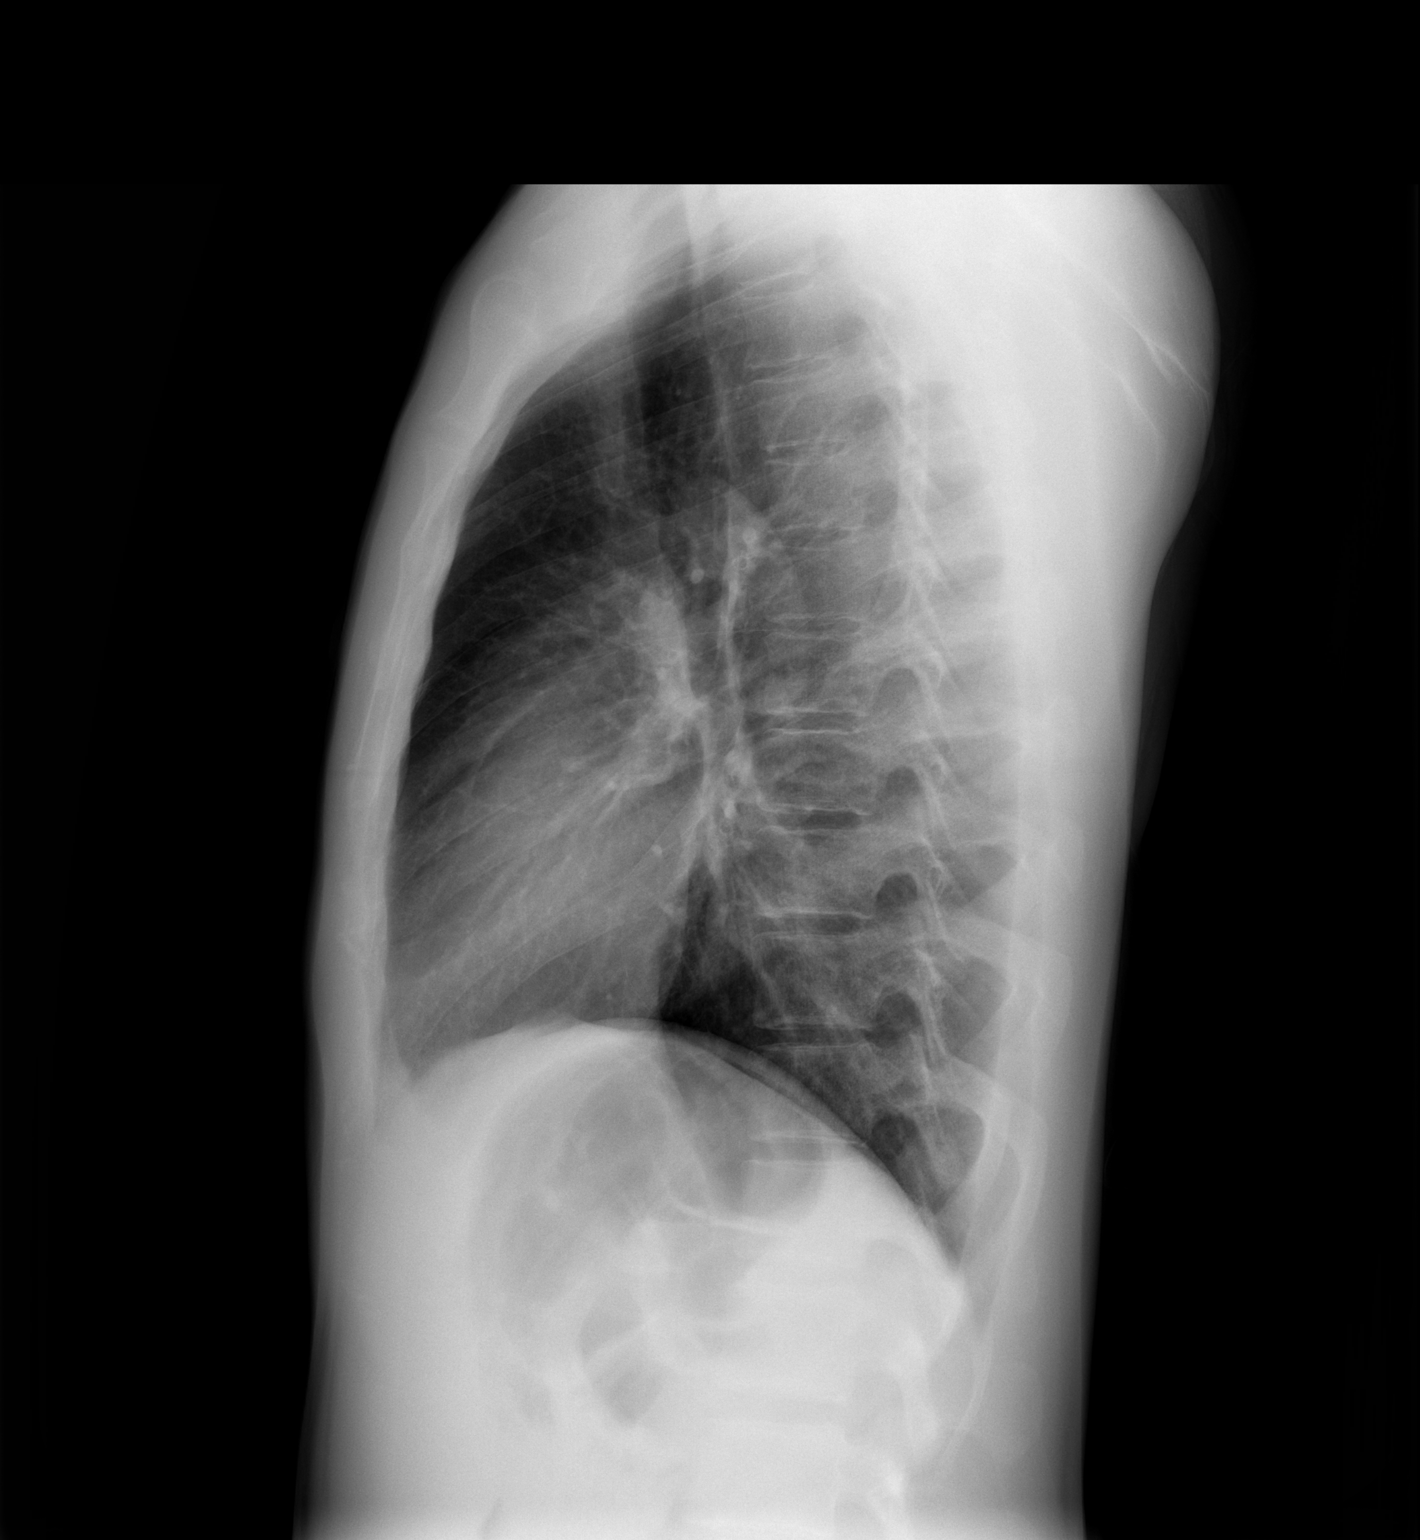

[2 of 2 positions shown; findings below may reference images not displayed]

FINDINGS: The cardiomediastinal silhouette is unremarkable.
The lungs are clear.
There is no evidence of focal airspace disease, pulmonary edema,
pleural effusion, or pneumothorax.
No acute or suspicious bony abnormalities are identified.
IMPRESSION: No evidence of acute cardiopulmonary disease.

## 2009-06-29 ENCOUNTER — Emergency Department (HOSPITAL_COMMUNITY): Admission: EM | Admit: 2009-06-29 | Discharge: 2009-06-29 | Payer: Self-pay | Admitting: Emergency Medicine

## 2009-07-03 ENCOUNTER — Ambulatory Visit: Payer: Self-pay | Admitting: Internal Medicine

## 2009-07-13 ENCOUNTER — Telehealth (INDEPENDENT_AMBULATORY_CARE_PROVIDER_SITE_OTHER): Payer: Self-pay | Admitting: *Deleted

## 2009-08-13 ENCOUNTER — Telehealth (INDEPENDENT_AMBULATORY_CARE_PROVIDER_SITE_OTHER): Payer: Self-pay | Admitting: *Deleted

## 2009-09-11 ENCOUNTER — Telehealth (INDEPENDENT_AMBULATORY_CARE_PROVIDER_SITE_OTHER): Payer: Self-pay | Admitting: *Deleted

## 2009-09-14 ENCOUNTER — Encounter: Payer: Self-pay | Admitting: Internal Medicine

## 2009-10-07 ENCOUNTER — Telehealth (INDEPENDENT_AMBULATORY_CARE_PROVIDER_SITE_OTHER): Payer: Self-pay | Admitting: *Deleted

## 2009-10-15 ENCOUNTER — Emergency Department (HOSPITAL_COMMUNITY): Admission: EM | Admit: 2009-10-15 | Discharge: 2009-10-15 | Payer: Self-pay | Admitting: Emergency Medicine

## 2009-10-15 IMAGING — CR DG CHEST 2V
2 series · 2 of 2 positions shown · non-contrast
Comparison: Chest radiograph performed [DATE]

CLINICAL DATA: Shortness of breath and right-sided chest pain.
History of asthma and smoking.

CHEST - 2 VIEW

[view not recorded (1 of 2)]
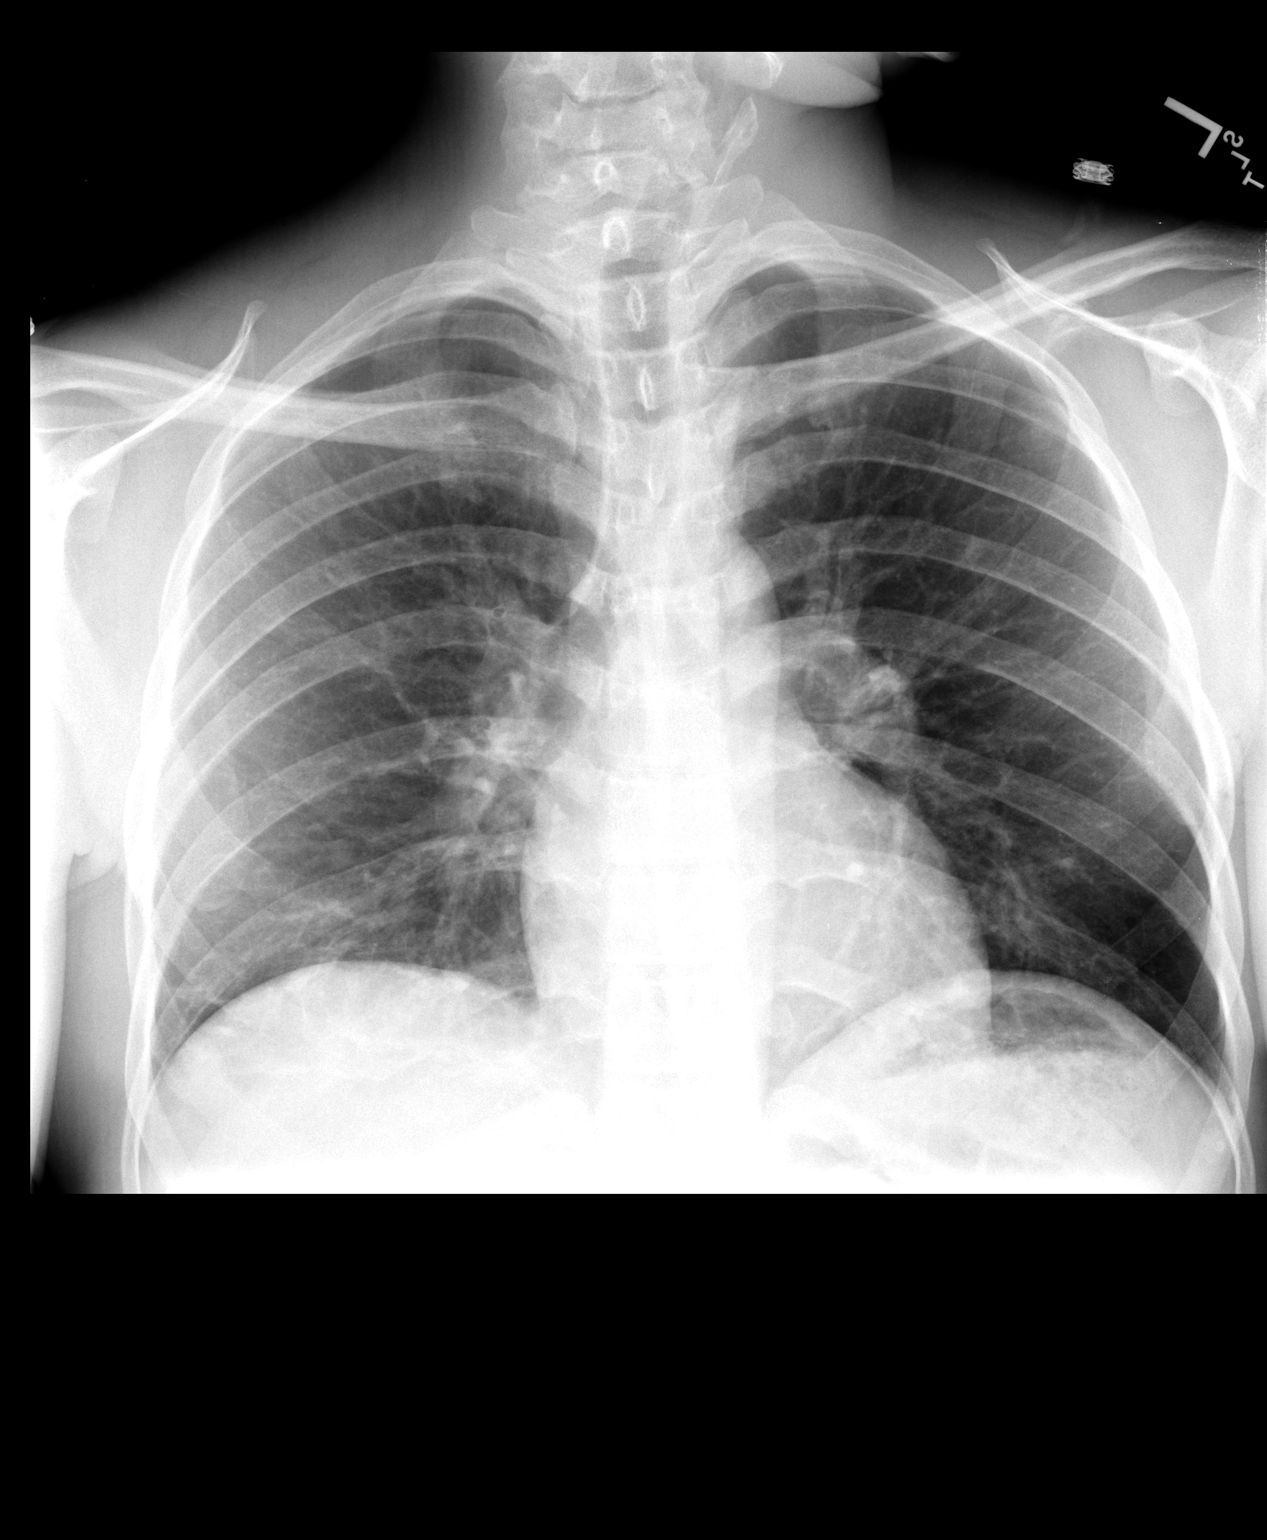

[view not recorded (2 of 2)]
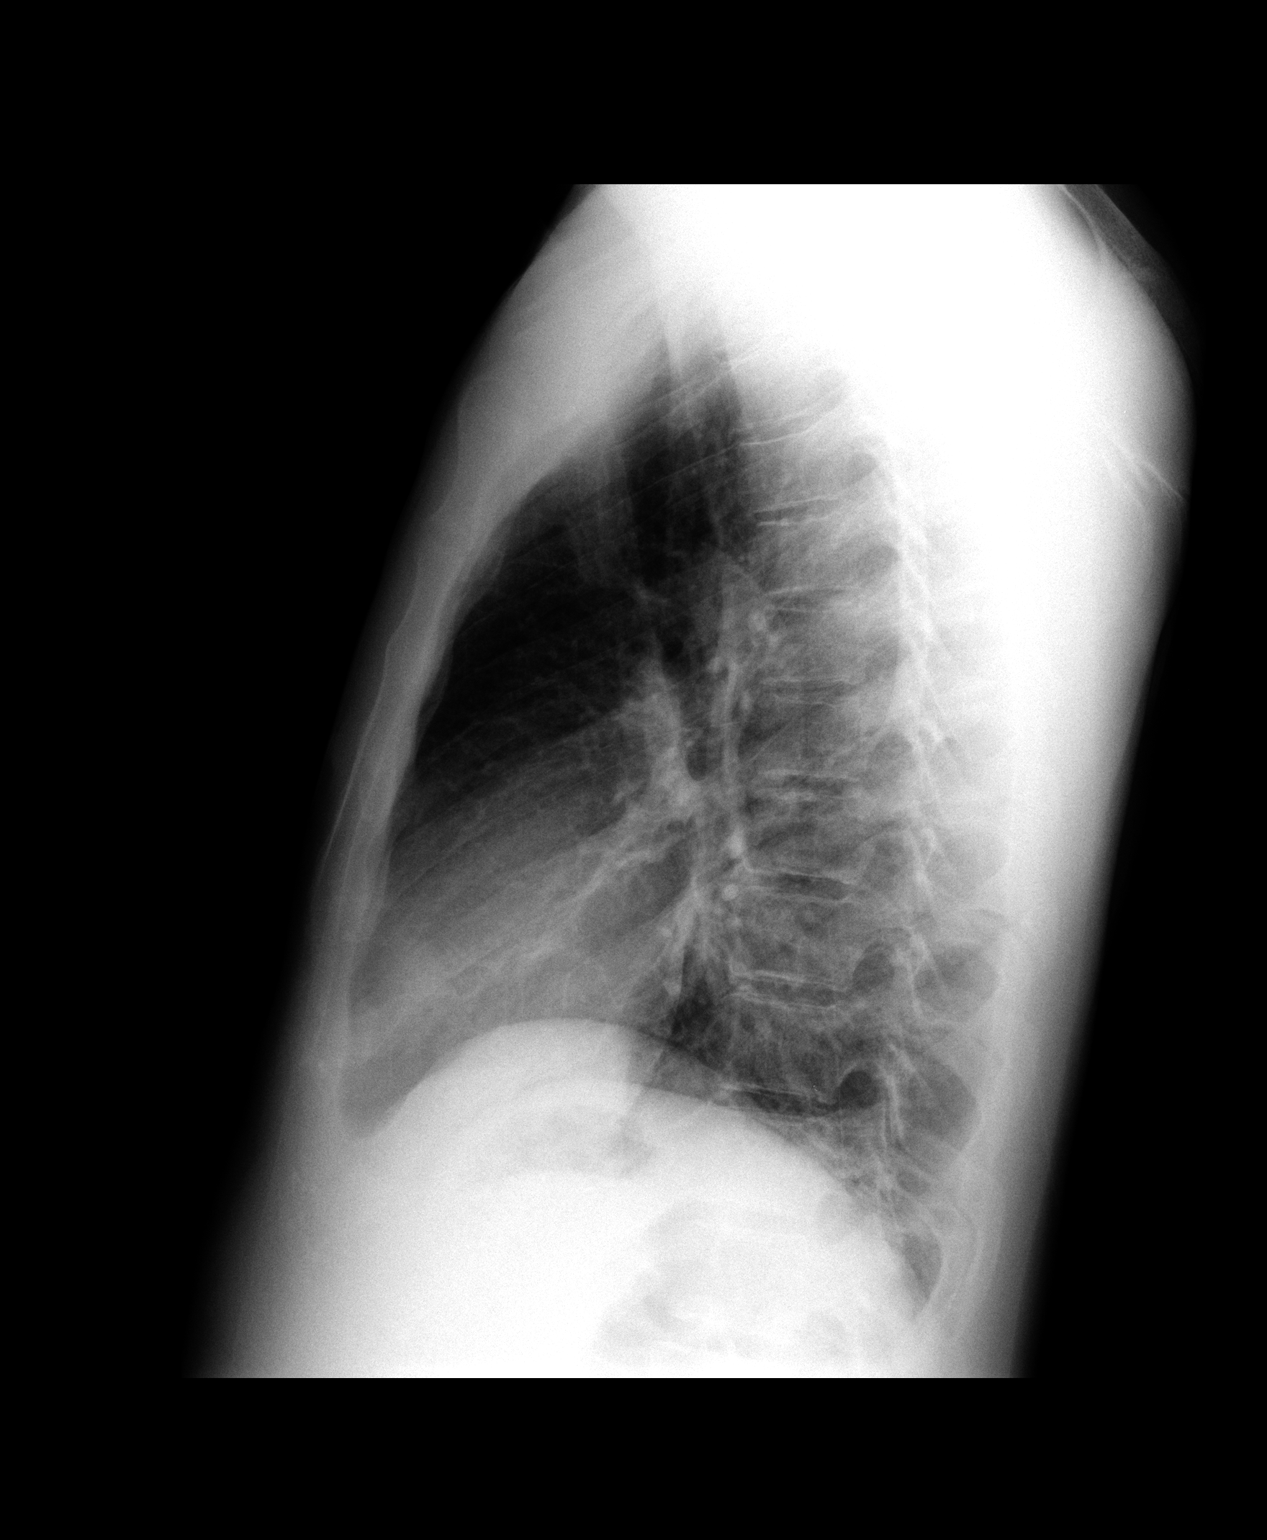

[2 of 2 positions shown; findings below may reference images not displayed]

FINDINGS: The lungs are well-aerated.  Mild peribronchial
thickening is again noted.  There is mild focal opacity at the
right lung base, raising concern for mild pneumonia.

The heart is normal in size; the mediastinal contour is within
normal limits.  No acute osseous abnormalities are seen.
IMPRESSION: Mild focal opacity at the right lung base raises concern for
pneumonia.

## 2009-11-07 ENCOUNTER — Telehealth (INDEPENDENT_AMBULATORY_CARE_PROVIDER_SITE_OTHER): Payer: Self-pay | Admitting: *Deleted

## 2009-11-14 ENCOUNTER — Encounter: Payer: Self-pay | Admitting: Internal Medicine

## 2009-12-15 ENCOUNTER — Telehealth (INDEPENDENT_AMBULATORY_CARE_PROVIDER_SITE_OTHER): Payer: Self-pay | Admitting: *Deleted

## 2009-12-18 ENCOUNTER — Encounter: Payer: Self-pay | Admitting: Internal Medicine

## 2009-12-31 ENCOUNTER — Emergency Department (HOSPITAL_COMMUNITY): Admission: EM | Admit: 2009-12-31 | Discharge: 2009-12-31 | Payer: Self-pay | Admitting: Family Medicine

## 2010-01-14 ENCOUNTER — Encounter (INDEPENDENT_AMBULATORY_CARE_PROVIDER_SITE_OTHER): Payer: Self-pay | Admitting: *Deleted

## 2010-01-29 ENCOUNTER — Telehealth: Payer: Self-pay | Admitting: Internal Medicine

## 2010-02-25 ENCOUNTER — Ambulatory Visit: Payer: Self-pay | Admitting: Internal Medicine

## 2010-02-25 LAB — CONVERTED CEMR LAB
ALT: 8 units/L (ref 0–53)
AST: 17 units/L (ref 0–37)
Albumin: 4.4 g/dL (ref 3.5–5.2)
Alkaline Phosphatase: 69 units/L (ref 39–117)
BUN: 8 mg/dL (ref 6–23)
Basophils Absolute: 0 10*3/uL (ref 0.0–0.1)
Basophils Relative: 0 % (ref 0–1)
CO2: 26 meq/L (ref 19–32)
Calcium: 9.7 mg/dL (ref 8.4–10.5)
Chloride: 102 meq/L (ref 96–112)
Cholesterol: 172 mg/dL (ref 0–200)
Creatinine, Ser: 0.98 mg/dL (ref 0.40–1.50)
Eosinophils Absolute: 0.1 10*3/uL (ref 0.0–0.7)
Eosinophils Relative: 2 % (ref 0–5)
Glucose, Bld: 83 mg/dL (ref 70–99)
HCT: 41.6 % (ref 39.0–52.0)
HDL: 41 mg/dL (ref >39)
HIV 1 RNA Quant: 6330 copies/mL — ABNORMAL HIGH (ref <48)
HIV-1 RNA Quant, Log: 3.8 — ABNORMAL HIGH (ref <1.68)
Hemoglobin: 13.7 g/dL (ref 13.0–17.0)
LDL Cholesterol: 121 mg/dL — ABNORMAL HIGH (ref 0–99)
Lymphocytes Relative: 62 % — ABNORMAL HIGH (ref 12–46)
Lymphs Abs: 3 10*3/uL (ref 0.7–4.0)
MCHC: 32.9 g/dL (ref 30.0–36.0)
MCV: 91.6 fL (ref 78.0–100.0)
Monocytes Absolute: 0.3 10*3/uL (ref 0.1–1.0)
Monocytes Relative: 6 % (ref 3–12)
Neutro Abs: 1.4 10*3/uL — ABNORMAL LOW (ref 1.7–7.7)
Neutrophils Relative %: 30 % — ABNORMAL LOW (ref 43–77)
Platelets: 357 10*3/uL (ref 150–400)
Potassium: 4 meq/L (ref 3.5–5.3)
RBC: 4.54 M/uL (ref 4.22–5.81)
RDW: 15 % (ref 11.5–15.5)
Sodium: 136 meq/L (ref 135–145)
Total Bilirubin: 0.4 mg/dL (ref 0.3–1.2)
Total CHOL/HDL Ratio: 4.2
Total Protein: 8.8 g/dL — ABNORMAL HIGH (ref 6.0–8.3)
Triglycerides: 52 mg/dL (ref <150)
VLDL: 10 mg/dL (ref 0–40)
WBC: 4.8 10*3/uL (ref 4.0–10.5)

## 2010-03-09 ENCOUNTER — Telehealth: Payer: Self-pay | Admitting: Internal Medicine

## 2010-04-01 ENCOUNTER — Telehealth: Payer: Self-pay | Admitting: Internal Medicine

## 2010-04-06 ENCOUNTER — Telehealth (INDEPENDENT_AMBULATORY_CARE_PROVIDER_SITE_OTHER): Payer: Self-pay | Admitting: Licensed Clinical Social Worker

## 2010-04-06 ENCOUNTER — Telehealth: Payer: Self-pay | Admitting: Internal Medicine

## 2010-05-05 ENCOUNTER — Telehealth: Payer: Self-pay | Admitting: Internal Medicine

## 2010-06-04 ENCOUNTER — Telehealth: Payer: Self-pay | Admitting: Internal Medicine

## 2010-07-05 ENCOUNTER — Telehealth (INDEPENDENT_AMBULATORY_CARE_PROVIDER_SITE_OTHER): Payer: Self-pay | Admitting: *Deleted

## 2010-07-30 ENCOUNTER — Telehealth (INDEPENDENT_AMBULATORY_CARE_PROVIDER_SITE_OTHER): Payer: Self-pay | Admitting: *Deleted

## 2010-08-16 ENCOUNTER — Ambulatory Visit: Payer: Self-pay | Admitting: Internal Medicine

## 2010-08-16 LAB — CONVERTED CEMR LAB
ALT: 13 units/L (ref 0–53)
AST: 15 units/L (ref 0–37)
Albumin: 4.2 g/dL (ref 3.5–5.2)
Alkaline Phosphatase: 52 units/L (ref 39–117)
BUN: 6 mg/dL (ref 6–23)
Basophils Absolute: 0 10*3/uL (ref 0.0–0.1)
Basophils Relative: 1 % (ref 0–1)
CO2: 24 meq/L (ref 19–32)
Calcium: 9.1 mg/dL (ref 8.4–10.5)
Chloride: 106 meq/L (ref 96–112)
Creatinine, Ser: 0.91 mg/dL (ref 0.40–1.50)
Eosinophils Absolute: 0.1 10*3/uL (ref 0.0–0.7)
Eosinophils Relative: 3 % (ref 0–5)
Glucose, Bld: 88 mg/dL (ref 70–99)
HCT: 40.9 % (ref 39.0–52.0)
HIV 1 RNA Quant: 14000 copies/mL — ABNORMAL HIGH (ref <20)
HIV-1 RNA Quant, Log: 4.15 — ABNORMAL HIGH (ref <1.30)
Hemoglobin: 13.8 g/dL (ref 13.0–17.0)
Lymphocytes Relative: 53 % — ABNORMAL HIGH (ref 12–46)
Lymphs Abs: 2.2 10*3/uL (ref 0.7–4.0)
MCHC: 33.7 g/dL (ref 30.0–36.0)
MCV: 96.9 fL (ref 78.0–100.0)
Monocytes Absolute: 0.5 10*3/uL (ref 0.1–1.0)
Monocytes Relative: 11 % (ref 3–12)
Neutro Abs: 1.3 10*3/uL — ABNORMAL LOW (ref 1.7–7.7)
Neutrophils Relative %: 32 % — ABNORMAL LOW (ref 43–77)
Platelets: 286 10*3/uL (ref 150–400)
Potassium: 4.3 meq/L (ref 3.5–5.3)
RBC: 4.22 M/uL (ref 4.22–5.81)
RDW: 14.3 % (ref 11.5–15.5)
Sodium: 140 meq/L (ref 135–145)
Total Bilirubin: 0.2 mg/dL — ABNORMAL LOW (ref 0.3–1.2)
Total Protein: 7.1 g/dL (ref 6.0–8.3)
WBC: 4.2 10*3/uL (ref 4.0–10.5)

## 2010-08-30 ENCOUNTER — Ambulatory Visit: Payer: Self-pay | Admitting: Internal Medicine

## 2010-09-08 ENCOUNTER — Encounter (INDEPENDENT_AMBULATORY_CARE_PROVIDER_SITE_OTHER): Payer: Self-pay | Admitting: *Deleted

## 2010-10-11 ENCOUNTER — Encounter: Payer: Self-pay | Admitting: Internal Medicine

## 2010-10-11 ENCOUNTER — Ambulatory Visit
Admission: RE | Admit: 2010-10-11 | Discharge: 2010-10-11 | Payer: Self-pay | Source: Home / Self Care | Attending: Internal Medicine | Admitting: Internal Medicine

## 2010-10-11 LAB — CONVERTED CEMR LAB
ALT: 12 units/L (ref 0–53)
AST: 21 units/L (ref 0–37)
Albumin: 4.8 g/dL (ref 3.5–5.2)
Alkaline Phosphatase: 69 units/L (ref 39–117)
BUN: 10 mg/dL (ref 6–23)
Basophils Absolute: 0 10*3/uL (ref 0.0–0.1)
Basophils Relative: 1 % (ref 0–1)
CO2: 23 meq/L (ref 19–32)
Calcium: 9.8 mg/dL (ref 8.4–10.5)
Chloride: 101 meq/L (ref 96–112)
Creatinine, Ser: 1 mg/dL (ref 0.40–1.50)
Eosinophils Absolute: 0.1 10*3/uL (ref 0.0–0.7)
Eosinophils Relative: 3 % (ref 0–5)
Glucose, Bld: 94 mg/dL (ref 70–99)
HCT: 49.5 % (ref 39.0–52.0)
HIV 1 RNA Quant: 1250 copies/mL — ABNORMAL HIGH (ref <20)
HIV-1 RNA Quant, Log: 3.1 — ABNORMAL HIGH (ref <1.30)
Hemoglobin: 16.5 g/dL (ref 13.0–17.0)
Lymphocytes Relative: 57 % — ABNORMAL HIGH (ref 12–46)
Lymphs Abs: 2.1 10*3/uL (ref 0.7–4.0)
MCHC: 33.3 g/dL (ref 30.0–36.0)
MCV: 98.2 fL (ref 78.0–100.0)
Monocytes Absolute: 0.5 10*3/uL (ref 0.1–1.0)
Monocytes Relative: 13 % — ABNORMAL HIGH (ref 3–12)
Neutro Abs: 1 10*3/uL — ABNORMAL LOW (ref 1.7–7.7)
Neutrophils Relative %: 27 % — ABNORMAL LOW (ref 43–77)
Platelets: 320 10*3/uL (ref 150–400)
Potassium: 5.3 meq/L (ref 3.5–5.3)
RBC: 5.04 M/uL (ref 4.22–5.81)
RDW: 14 % (ref 11.5–15.5)
RPR Ser Ql: REACTIVE — AB
RPR Titer: 1:8 {titer}
Sodium: 135 meq/L (ref 135–145)
T pallidum Antibodies (TP-PA): >8 — ABNORMAL HIGH (ref <0.90)
Total Bilirubin: 0.3 mg/dL (ref 0.3–1.2)
Total Protein: 8.8 g/dL — ABNORMAL HIGH (ref 6.0–8.3)
WBC: 3.7 10*3/uL — ABNORMAL LOW (ref 4.0–10.5)

## 2010-10-12 ENCOUNTER — Telehealth (INDEPENDENT_AMBULATORY_CARE_PROVIDER_SITE_OTHER): Payer: Self-pay | Admitting: *Deleted

## 2010-10-13 LAB — T-HELPER CELL (CD4) - (RCID CLINIC ONLY)
CD4 % Helper T Cell: 20 % — ABNORMAL LOW (ref 33–55)
CD4 T Cell Abs: 390 uL — ABNORMAL LOW (ref 400–2700)

## 2010-10-22 ENCOUNTER — Telehealth (INDEPENDENT_AMBULATORY_CARE_PROVIDER_SITE_OTHER): Payer: Self-pay | Admitting: *Deleted

## 2010-10-25 ENCOUNTER — Ambulatory Visit: Admit: 2010-10-25 | Payer: Self-pay | Attending: Internal Medicine | Admitting: Internal Medicine

## 2010-10-26 NOTE — Miscellaneous (Signed)
Summary: Lab orders   Clinical Lists Changes  Orders: Added new Test order of T-CD4SP [facility]) (CD4SP) - Signed Added new Test order of T-Comprehensive Metabolic Panel [phone]) - Signed Added new Test order of T-HIV Viral Load [phone]) - Signed Added new Test order of T-CBC w/Diff ([phone]) - Signed

## 2010-10-26 NOTE — Progress Notes (Signed)
Summary: NCADAP rxes arrived  Phone Note Outgoing Call   Call placed by: Jennet Maduro RN,  July 30, 2010 3:26 PM Call placed to: Patient Action Taken: Assistance medications ready for pick up Summary of Call: NCADAP rxes, Atripla and omeprazole, arrived.  Pt. notified. Jennet Maduro RN  July 30, 2010 3:27 PM

## 2010-10-26 NOTE — Letter (Signed)
Summary: Arthur [name]: Income Verification  Arthur [name]: Income Verification   Imported By: Florinda Marker 12/30/2009 16:46:12  _____________________________________________________________________  External Attachment:    Type:   Image     Comment:   External Document

## 2010-10-26 NOTE — Progress Notes (Signed)
Summary: pt exposed to syphillis  Phone Note Call from Patient   Caller: Patient Call For: Yisroel Ramming MD Summary of Call: Patient called stating that his partner was tested partner positive for syphillis, and wanted to be tested. [facility] because Dr. Danella Deis schedule was booked this week. Initial call taken by: Starleen Arms CMA,  April 06, 2010 12:22 PM

## 2010-10-26 NOTE — Progress Notes (Signed)
Summary: ADAP Atripla & Omperazole  Phone Note Outgoing Call   Summary of Call: ADAP Atripla & Omperazole Medication(s) have arrived.  Left message or spoke to patient advising them they were ready for pickup.    Initial call taken by: Altamease Oiler,  July 05, 2010 4:26 PM

## 2010-10-26 NOTE — Progress Notes (Signed)
Summary: NCADAP/pt assist med arrived from [facility] [place])  Phone Note Refill Request      Prescriptions: PRILOSEC 20 MG CPDR (OMEPRAZOLE) Take 1 tablet by mouth once a day  #30 x 0   Entered by:   Paulo Fruit  BS,CPht II,MPH   Authorized by:   Yisroel Ramming MD   Signed by:   Paulo Fruit  BS,CPht II,MPH on 04/01/2010   Method used:   Samples Given   RxID:   [id number]  Patient Assist Medication Verification: Medication name: Omeprazole 20mg  RX # [id number] Tech approval:MLD  Patient just received the Omeprazole.  The Atripla should be coming soon.   Paulo Fruit  BS,CPht II,MPH  April 01, 2010 10:55 AM
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
## 2010-10-26 NOTE — Miscellaneous (Signed)
Summary: RW Update  Clinical Lists Changes  Observations: Added new observation of YEARAIDSPOS: 2010  (11/14/2009 9:34) Added new observation of HIV STATUS: CDC-defined AIDS  (11/14/2009 9:34) 

## 2010-10-26 NOTE — Miscellaneous (Signed)
Summary: clinical update/ryan white NCADAP apprv til 12/25/10  Clinical Lists Changes  Observations: Added new observation of AIDSDAP: Yes 2011 (01/14/2010 14:45)

## 2010-10-26 NOTE — Progress Notes (Signed)
Summary: NCADAP/pt assist meds arrived for Jul  Phone Note Refill Request      Prescriptions: PRILOSEC 20 MG CPDR (OMEPRAZOLE) Take 1 tablet by mouth once a day  #30 x 0   Entered by:   Paulo Fruit  BS,CPht II,MPH   Authorized by:   Yisroel Ramming MD   Signed by:   Paulo Fruit  BS,CPht II,MPH on 04/06/2010   Method used:   Samples Given   RxID:   [id number] ATRIPLA 600-200-300 MG TABS (EFAVIRENZ-EMTRICITAB-TENOFOVIR) Take 1 tablet by mouth at bedtime  #30 x 0   Entered by:   Paulo Fruit  BS,CPht II,MPH   Authorized by:   Yisroel Ramming MD   Signed by:   Paulo Fruit  BS,CPht II,MPH on 04/06/2010   Method used:   Samples Given   RxID:   [id number]  Patient Assist Medication Verification: Medication name: Atripla RX # [id number] Tech approval:MLD  Patient Assist Medication Verification: Medication name:Omeprazole 20mg  RX # [id number] Tech approval:MLD Call placed to patient with message that assistance medications are ready for pick-up. Left message for patient to call office.Paulo Fruit  BS,CPht II,MPH  April 06, 2010 11:12 AM

## 2010-10-26 NOTE — Progress Notes (Signed)
Summary: [place]/pt assist meds arrived for Mar  Phone Note Refill Request      Prescriptions: PRILOSEC 20 MG CPDR (OMEPRAZOLE) Take 1 tablet by mouth once a day  #30 x 0   Entered by:   Paulo Fruit  BS,CPht II,MPH   Authorized by:   Yisroel Ramming MD   Signed by:   Paulo Fruit  BS,CPht II,MPH on 12/15/2009   Method used:   Samples Given   RxID:   [id number] ATRIPLA 600-200-300 MG TABS (EFAVIRENZ-EMTRICITAB-TENOFOVIR) Take 1 tablet by mouth at bedtime  #30 x 0   Entered by:   Paulo Fruit  BS,CPht II,MPH   Authorized by:   Yisroel Ramming MD   Signed by:   Paulo Fruit  BS,CPht II,MPH on 12/15/2009   Method used:   Samples Given   RxID:   [id number]  Patient Assist Medication Verification: Medication name: Omeprazole 20mg  RX # [id number] Tech approval:MLD   Patient Assist Medication Verification: Medication:Atripla Lot# [id number] Exp Date:07 2013 Tech approval:MLD Call placed to patient with message that assistance medications are ready for pick-up. Left message for patient to call office. Paulo Fruit  BS,CPht II,MPH  December 15, 2009 2:41 PM                  Appended Document: [facility]AP/pt assist meds arrived for Mar Prescription/Samples picked up by: patient He also completed applications for [employer] and NC[facility]AP

## 2010-10-26 NOTE — Progress Notes (Signed)
Summary: NCADAP/pt assist meds arrived for Feb  Phone Note Refill Request      Prescriptions: PRILOSEC 20 MG CPDR (OMEPRAZOLE) Take 1 tablet by mouth once a day  #30 x 0   Entered by:   Paulo Fruit  BS,CPht II,MPH   Authorized by:   Yisroel Ramming MD   Signed by:   Paulo Fruit  BS,CPht II,MPH on 11/07/2009   Method used:   Samples Given   RxID:   [id number] ATRIPLA 600-200-300 MG TABS (EFAVIRENZ-EMTRICITAB-TENOFOVIR) Take 1 tablet by mouth at bedtime  #30 x 0   Entered by:   Paulo Fruit  BS,CPht II,MPH   Authorized by:   Yisroel Ramming MD   Signed by:   Paulo Fruit  BS,CPht II,MPH on 11/07/2009   Method used:   Samples Given   RxID:   [id number]  Patient Assist Medication Verification: Medication name: Omeprazole 20mg  RX # [id number] Tech approval:MLD   Patient Assist Medication Verification: Medication:Atripla Lot# [id number] Exp Date:06 2012 Tech approval:MLD Will call patient on Monday, 11/09/09 Paulo Fruit  BS,CPht II,MPH  November 07, 2009 10:16 AM

## 2010-10-26 NOTE — Progress Notes (Signed)
Summary: ncadap meds arrived for Sept--unable to reach pt  Phone Note Refill Request      Prescriptions: PRILOSEC 20 MG CPDR (OMEPRAZOLE) Take 1 tablet by mouth once a day  #30 x 0   Entered by:   Paulo Fruit  BS,CPht II,MPH   Authorized by:   Yisroel Ramming MD   Signed by:   Paulo Fruit  BS,CPht II,MPH on 06/04/2010   Method used:   Samples Given   RxID:   [id number] ATRIPLA 600-200-300 MG TABS (EFAVIRENZ-EMTRICITAB-TENOFOVIR) Take 1 tablet by mouth at bedtime  #30 x 0   Entered by:   Paulo Fruit  BS,CPht II,MPH   Authorized by:   Yisroel Ramming MD   Signed by:   Paulo Fruit  BS,CPht II,MPH on 06/04/2010   Method used:   Samples Given   RxID:   [id number]  Patient Assist Medication Verification: Medication name: Atripla RX # [id number] Tech approval:MLD  Patient Assist Medication Verification: Medication name:Omeprazole 20mg  RX # [id number] Tech approval:MLD Tried to contact patient.  Unable to reach him and unable to leave a message because at the time call was placed--their voicemail box was not set up to accept messsages. Paulo Fruit  BS,CPht II,MPH  June 04, 2010 4:03 PM

## 2010-10-26 NOTE — Progress Notes (Signed)
Summary: new script  Phone Note From Pharmacy   Caller: Hilma Favors ADAP Details for Reason: Need new script Summary of Call: New ADAP pharmacy called requesting a new prescription for patient's Atripla.  None was transfered from old ADAP pharmacy, [facility]. Initial call taken by: Paulo Fruit  BS,CPht II,MPH,  March 09, 2010 4:34 PM    Prescriptions: ATRIPLA 600-200-300 MG TABS (EFAVIRENZ-EMTRICITAB-TENOFOVIR) Take 1 tablet by mouth at bedtime  #30 x 5   Entered by:   Paulo Fruit  BS,CPht II,MPH   Authorized by:   Yisroel Ramming MD   Signed by:   Paulo Fruit  BS,CPht II,MPH on 03/09/2010   Method used:   Electronically to        [facility] [phone]* (retail)       [address]       [place], [place]  [address]       Ph: [phone]       Fax:    RxID:   [id number]  Paulo Fruit  BS,CPht II,MPH  March 09, 2010 4:35 PM

## 2010-10-26 NOTE — Progress Notes (Signed)
Summary: NCADAP meds arrived for Aug  Phone Note Refill Request      Prescriptions: PRILOSEC 20 MG CPDR (OMEPRAZOLE) Take 1 tablet by mouth once a day  #30 x 0   Entered by:   Paulo Fruit  BS,CPht II,MPH   Authorized by:   Yisroel Ramming MD   Signed by:   Paulo Fruit  BS,CPht II,MPH on 05/05/2010   Method used:   Samples Given   RxID:   [id number] ATRIPLA 600-200-300 MG TABS (EFAVIRENZ-EMTRICITAB-TENOFOVIR) Take 1 tablet by mouth at bedtime  #30 x 0   Entered by:   Paulo Fruit  BS,CPht II,MPH   Authorized by:   Yisroel Ramming MD   Signed by:   Paulo Fruit  BS,CPht II,MPH on 05/05/2010   Method used:   Samples Given   RxID:   [id number]  Patient Assist Medication Verification: Medication name: [place] [employer] RX # [id number] Tech approval:MLD  Patient Assist Medication Verification: Medication name:Omeprazole 20mg  RX # [id number] Tech approval:MLD Call placed to patient with message that assistance medications are ready for pick-up. Paulo Fruit  BS,CPht II,MPH  May 05, 2010 4:21 PM

## 2010-10-26 NOTE — Progress Notes (Signed)
Summary: NCADAP/pt assist med arrived for Jan  Phone Note Refill Request      Prescriptions: ATRIPLA 600-200-300 MG TABS (EFAVIRENZ-EMTRICITAB-TENOFOVIR) Take 1 tablet by mouth at bedtime  #30 x 0   Entered by:   Paulo Fruit  BS,CPht II,MPH   Authorized by:   Yisroel Ramming MD   Signed by:   Paulo Fruit  BS,CPht II,MPH on 10/07/2009   Method used:   Samples Given   RxID:   [id number]   Patient Assist Medication Verification: Medication: Atripla Lot#  [id number] Exp Date:06 2013 Tech approval:MLD Call placed to patient with message that assistance medications are ready for pick-up. Paulo Fruit  BS,CPht II,MPH  October 07, 2009 4:51 PM

## 2010-10-26 NOTE — Progress Notes (Signed)
Summary: NCADAP/pt assist meds arrrived for May  Phone Note Refill Request      Prescriptions: PRILOSEC 20 MG CPDR (OMEPRAZOLE) Take 1 tablet by mouth once a day  #30 x 0   Entered by:   Paulo Fruit  BS,CPht II,MPH   Authorized by:   Yisroel Ramming MD   Signed by:   Paulo Fruit  BS,CPht II,MPH on 01/29/2010   Method used:   Samples Given   RxID:   [id number] ATRIPLA 600-200-300 MG TABS (EFAVIRENZ-EMTRICITAB-TENOFOVIR) Take 1 tablet by mouth at bedtime  #30 x 0   Entered by:   Paulo Fruit  BS,CPht II,MPH   Authorized by:   Yisroel Ramming MD   Signed by:   Paulo Fruit  BS,CPht II,MPH on 01/29/2010   Method used:   Samples Given   RxID:   [id number]  Patient Assist Medication Verification: Medication name: Omeprazole 20mg  RX #  [id number] Tech approval:MLD   Patient Assist Medication Verification: Medication:Atripla Lot# [id number] Exp Date:08 2013 Tech approval:MLD Need appt!! Call placed to patient with message that assistance medications are ready for pick-up. Left message for patient to contact office @ [phone] Paulo Fruit  BS,CPht II,MPH  Jan 29, 2010 9:56 AM

## 2010-10-28 NOTE — Miscellaneous (Signed)
  Clinical Lists Changes  Observations: Added new observation of YEARAIDSPOS: 2008  (09/08/2010 11:41)

## 2010-10-28 NOTE — Progress Notes (Addendum)
Summary: ADAP rxes, pt. needs to transfer rxes to [facility]   Phone Note Outgoing Call   Call placed by: Jennet Maduro RN,  October 22, 2010 2:24 PM Call placed to: Patient Action Taken: Assistance medications ready for pick up Summary of Call: NCADAP rxes arrived.  Pt needs to transfer rxes to [address]  Precious Haws left. Jennet Maduro RN  October 22, 2010 2:26 PM     Prescriptions: PRILOSEC 20 MG CPDR (OMEPRAZOLE) Take 1 tablet by mouth once a day  #30 x 6   Entered by:   Jennet Maduro RN   Authorized by:   Johny Sax MD   Signed by:   Jennet Maduro RN on 10/22/2010   Method used:   Samples Given   RxID:   [id number] ATRIPLA 600-200-300 MG TABS (EFAVIRENZ-EMTRICITAB-TENOFOVIR) Take 1 tablet by mouth at bedtime  #30 x 0   Entered by:   Jennet Maduro RN   Authorized by:   Johny Sax MD   Signed by:   Jennet Maduro RN on 10/22/2010   Method used:   Samples Given   RxID:   [id number]

## 2010-10-28 NOTE — Miscellaneous (Signed)
  Clinical Lists Changes 

## 2010-10-28 NOTE — Progress Notes (Addendum)
Summary: positive RPR  Phone Note Outgoing Call   Caller: Patient Call placed by: Annice Pih Summary of Call: Called pt. to notify of positive RPR, pt. needs Bicillian x 3 Initial call taken by: Wendall Mola CMA ( AAMA),  October 12, 2010 2:27 PM     Appended Document: positive RPR Left pt. three messages regarding labs, pt. has not returned my call

## 2010-12-07 LAB — T-HELPER CELL (CD4) - (RCID CLINIC ONLY)
CD4 % Helper T Cell: 21 % — ABNORMAL LOW (ref 33–55)
CD4 T Cell Abs: 470 uL (ref 400–2700)

## 2010-12-13 LAB — T-HELPER CELL (CD4) - (RCID CLINIC ONLY)
CD4 % Helper T Cell: 18 % — ABNORMAL LOW (ref 33–55)
CD4 T Cell Abs: 520 uL (ref 400–2700)

## 2010-12-31 LAB — T-HELPER CELL (CD4) - (RCID CLINIC ONLY)
CD4 % Helper T Cell: 34 % (ref 33–55)
CD4 T Cell Abs: 610 uL (ref 400–2700)

## 2011-01-05 LAB — T-HELPER CELL (CD4) - (RCID CLINIC ONLY)
CD4 % Helper T Cell: 27 % — ABNORMAL LOW (ref 33–55)
CD4 T Cell Abs: 640 uL (ref 400–2700)

## 2011-03-06 ENCOUNTER — Inpatient Hospital Stay (INDEPENDENT_AMBULATORY_CARE_PROVIDER_SITE_OTHER)
Admission: RE | Admit: 2011-03-06 | Discharge: 2011-03-06 | Disposition: A | Discharge status: Home / Self Care | Payer: Self-pay | Source: Ambulatory Visit | Attending: Family Medicine | Admitting: Family Medicine

## 2011-03-06 ENCOUNTER — Ambulatory Visit (INDEPENDENT_AMBULATORY_CARE_PROVIDER_SITE_OTHER): Payer: Self-pay

## 2011-03-06 DIAGNOSIS — N419 Inflammatory disease of prostate, unspecified: Secondary | ICD-10-CM

## 2011-03-06 LAB — POCT URINALYSIS DIP (DEVICE)
Glucose, UA: NEGATIVE mg/dL
Nitrite: NEGATIVE
Protein, ur: 100 mg/dL — AB
Specific Gravity, Urine: 1.025 (ref 1.005–1.030)
Urobilinogen, UA: 1 mg/dL (ref 0.0–1.0)
pH: 6.5 (ref 5.0–8.0)

## 2011-03-06 IMAGING — CR DG ABDOMEN 1V
1 series · 1 of 1 positions shown · non-contrast
Comparison: None.

CLINICAL DATA: Constipation.  Rectal pain.

ABDOMEN - 1 VIEW

[view not recorded]
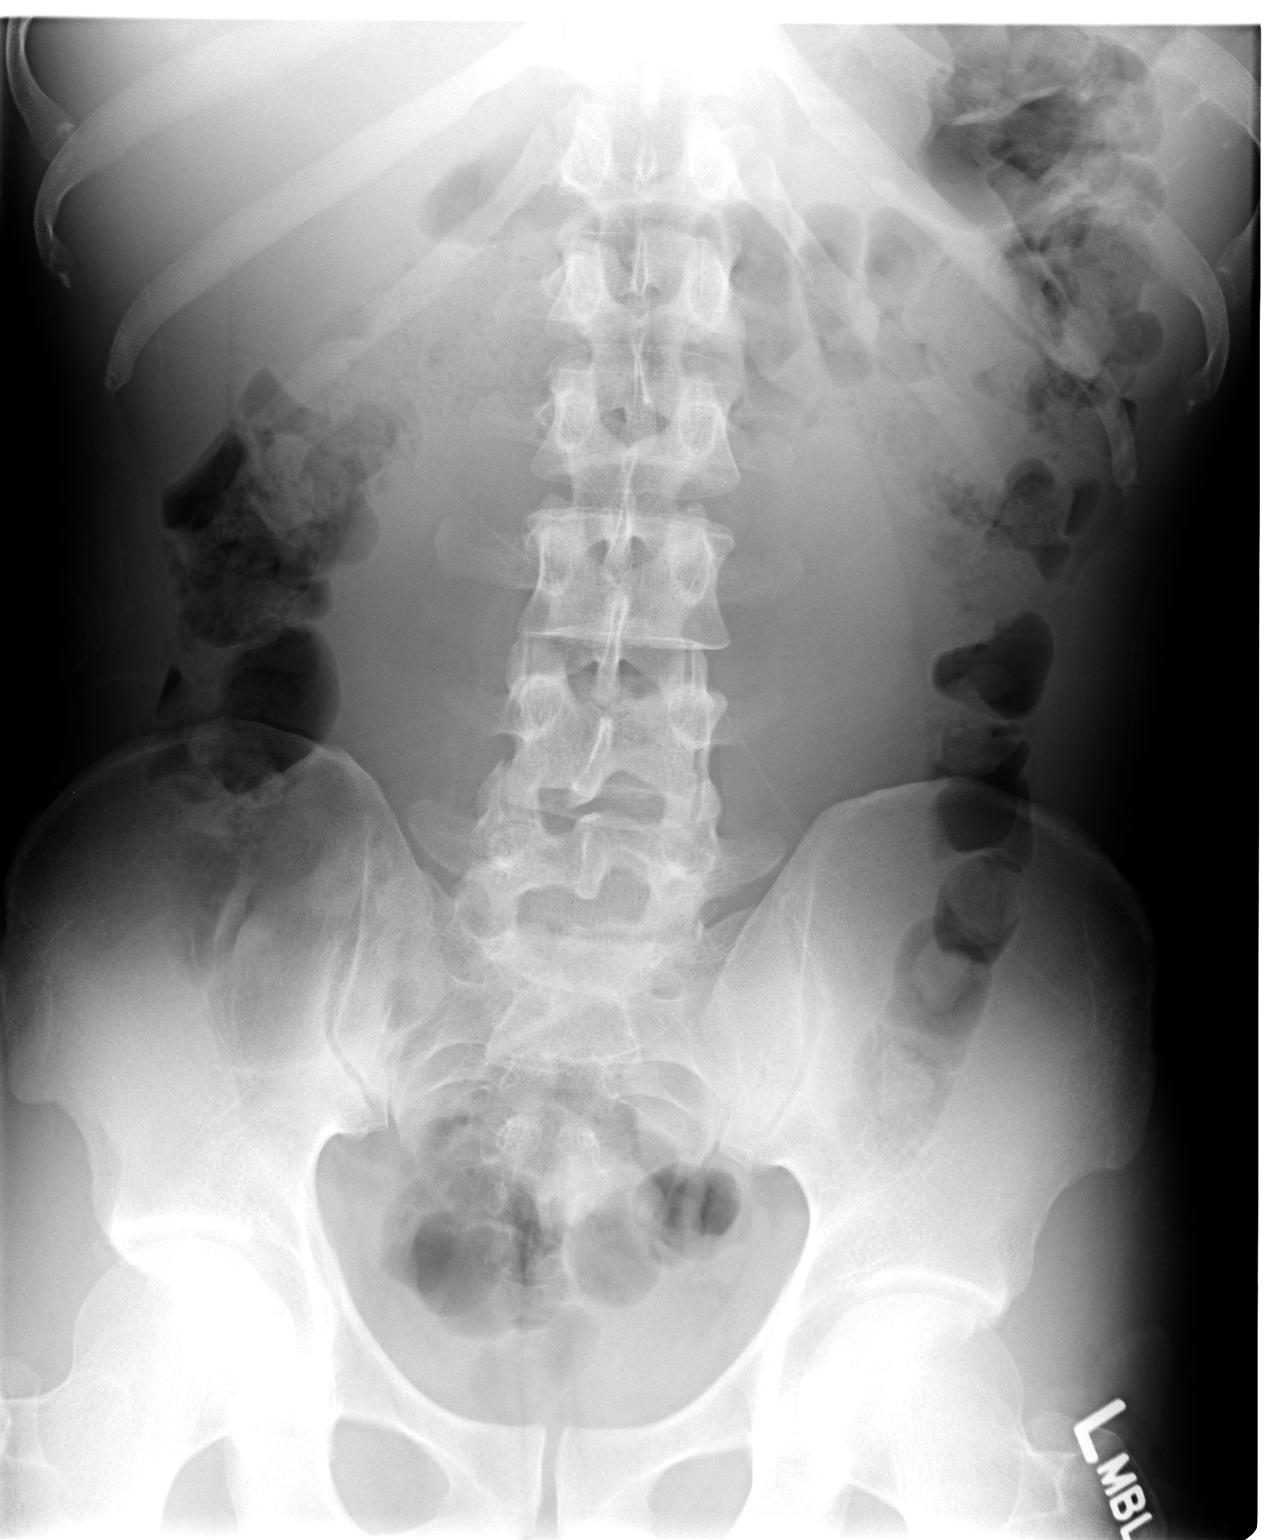

[1 of 1 positions shown; findings below may reference images not displayed]

FINDINGS: There is no evidence of bowel obstruction.  Moderate
stool burden in the transverse colon noted.  No unexpected
abdominal calcification.
IMPRESSION: Moderate stool burden transverse colon.

## 2011-03-08 LAB — URINE CULTURE
Colony Count: NO GROWTH
Culture  Setup Time: 201206102355
Culture: NO GROWTH

## 2011-06-17 LAB — T-HELPER CELL (CD4) - (RCID CLINIC ONLY): CD4 % Helper T Cell: 23 — ABNORMAL LOW

## 2011-07-05 LAB — T-HELPER CELL (CD4) - (RCID CLINIC ONLY): CD4 % Helper T Cell: 21 — ABNORMAL LOW

## 2011-07-07 LAB — CULTURE, ROUTINE-ABSCESS: Culture: NO GROWTH

## 2011-07-11 LAB — T-HELPER CELL (CD4) - (RCID CLINIC ONLY): CD4 % Helper T Cell: 14 — ABNORMAL LOW

## 2011-08-16 ENCOUNTER — Telehealth: Payer: Self-pay | Admitting: *Deleted

## 2011-08-16 NOTE — Telephone Encounter (Signed)
Referral made to [facility]. Claudy Abdallah CMA  

## 2011-09-26 ENCOUNTER — Other Ambulatory Visit: Payer: Self-pay | Admitting: Infectious Disease

## 2011-09-26 ENCOUNTER — Other Ambulatory Visit (INDEPENDENT_AMBULATORY_CARE_PROVIDER_SITE_OTHER): Payer: Self-pay

## 2011-09-26 ENCOUNTER — Other Ambulatory Visit: Payer: Self-pay | Admitting: Infectious Diseases

## 2011-09-26 ENCOUNTER — Telehealth: Payer: Self-pay

## 2011-09-26 ENCOUNTER — Ambulatory Visit: Payer: Self-pay

## 2011-09-26 DIAGNOSIS — Z113 Encounter for screening for infections with a predominantly sexual mode of transmission: Secondary | ICD-10-CM

## 2011-09-26 DIAGNOSIS — B20 Human immunodeficiency virus [HIV] disease: Secondary | ICD-10-CM

## 2011-09-26 DIAGNOSIS — Z79899 Other long term (current) drug therapy: Secondary | ICD-10-CM

## 2011-09-26 LAB — CBC WITH DIFFERENTIAL/PLATELET
Eosinophils Relative: 2 % (ref 0–5)
HCT: 42.3 % (ref 39.0–52.0)
Hemoglobin: 14 g/dL (ref 13.0–17.0)
Lymphocytes Relative: 41 % (ref 12–46)
Lymphs Abs: 1.5 10*3/uL (ref 0.7–4.0)
MCV: 95.3 fL (ref 78.0–100.0)
Monocytes Absolute: 0.4 10*3/uL (ref 0.1–1.0)
Monocytes Relative: 11 % (ref 3–12)
RBC: 4.44 MIL/uL (ref 4.22–5.81)
WBC: 3.7 10*3/uL — ABNORMAL LOW (ref 4.0–10.5)

## 2011-09-26 LAB — T-HELPER CELL (CD4) - (RCID CLINIC ONLY): CD4 % Helper T Cell: 17 % — ABNORMAL LOW (ref 33–55)

## 2011-09-26 LAB — LIPID PANEL
Cholesterol: 153 mg/dL (ref 0–200)
HDL: 33 mg/dL — ABNORMAL LOW (ref >39)
Total CHOL/HDL Ratio: 4.6 Ratio

## 2011-09-26 LAB — COMPREHENSIVE METABOLIC PANEL
Albumin: 3.6 g/dL (ref 3.5–5.2)
BUN: 9 mg/dL (ref 6–23)
CO2: 24 mEq/L (ref 19–32)
Calcium: 8.9 mg/dL (ref 8.4–10.5)
Chloride: 104 mEq/L (ref 96–112)
Creat: 0.97 mg/dL (ref 0.50–1.35)

## 2011-09-26 LAB — RPR TITER: RPR Titer: 1:4 {titer}

## 2011-09-26 NOTE — Telephone Encounter (Signed)
Patient came in for labs today and to see me for RW and ADAP - he had not been in clinic for a year and has not had ADAP since 12/25/10 - needs to see Dr to be put on meds and rescheduled appt for adap/rw on 10/10/11 after Dr appt. Patient also seemed very disoriented, walking slowly and swaying slightly - asked if he was OK and he said yes.

## 2011-09-27 LAB — GC/CHLAMYDIA PROBE AMP, URINE: GC Probe Amp, Urine: NEGATIVE

## 2011-09-29 LAB — T.PALLIDUM AB, TOTAL: T pallidum Antibodies (TP-PA): >8 S/CO — ABNORMAL HIGH (ref <0.90)

## 2011-10-10 ENCOUNTER — Other Ambulatory Visit: Payer: Self-pay | Admitting: Infectious Disease

## 2011-10-10 ENCOUNTER — Ambulatory Visit: Payer: Self-pay | Admitting: Infectious Disease

## 2011-10-10 ENCOUNTER — Ambulatory Visit: Payer: Self-pay

## 2011-10-10 NOTE — Progress Notes (Signed)
Patient ID: Arthur [name], male   DOB: May 04, 1981, 31 y.o.   MRN: [id number] Pt with high viral load has been on atripla in past need to add on genotype

## 2011-10-13 ENCOUNTER — Ambulatory Visit: Payer: Self-pay | Admitting: Infectious Disease

## 2011-10-13 ENCOUNTER — Ambulatory Visit: Payer: Self-pay

## 2011-10-13 ENCOUNTER — Telehealth: Payer: Self-pay | Admitting: *Deleted

## 2011-10-13 NOTE — Telephone Encounter (Signed)
Pt missed OV with Dr. [name] . Attempted to contact patient via phone number available and could not reach. Phone number was not in service. Tacey Heap RN

## 2011-10-25 LAB — HIV-1 GENOTYPR PLUS

## 2011-11-09 ENCOUNTER — Encounter: Payer: Self-pay | Admitting: Licensed Clinical Social Worker

## 2011-11-23 ENCOUNTER — Ambulatory Visit (INDEPENDENT_AMBULATORY_CARE_PROVIDER_SITE_OTHER): Payer: Self-pay | Admitting: Infectious Disease

## 2011-11-23 ENCOUNTER — Encounter: Payer: Self-pay | Admitting: Infectious Disease

## 2011-11-23 VITALS — BP 128/85 | HR 99 | Temp 97.3°F | Ht 70.0 in | Wt 153.5 lb

## 2011-11-23 DIAGNOSIS — A539 Syphilis, unspecified: Secondary | ICD-10-CM

## 2011-11-23 DIAGNOSIS — Z23 Encounter for immunization: Secondary | ICD-10-CM

## 2011-11-23 DIAGNOSIS — B20 Human immunodeficiency virus [HIV] disease: Secondary | ICD-10-CM

## 2011-11-23 DIAGNOSIS — A529 Late syphilis, unspecified: Secondary | ICD-10-CM

## 2011-11-23 DIAGNOSIS — F341 Dysthymic disorder: Secondary | ICD-10-CM

## 2011-11-23 DIAGNOSIS — F17200 Nicotine dependence, unspecified, uncomplicated: Secondary | ICD-10-CM

## 2011-11-23 MED ORDER — EMTRICITABINE-TENOFOVIR DF 200-300 MG PO TABS: 1.0000 | ORAL_TABLET | Freq: Every day | ORAL | Status: DC

## 2011-11-23 MED ORDER — PENICILLIN G BENZATHINE 1200000 UNIT/2ML IM SUSP
1.2000 10*6.[IU] | Freq: Once | INTRAMUSCULAR | Status: AC
  Administered 2011-11-23: 1.2 10*6.[IU] via INTRAMUSCULAR

## 2011-11-23 MED ORDER — RITONAVIR 100 MG PO TABS: 100.0000 mg | ORAL_TABLET | Freq: Every day | ORAL | Status: DC

## 2011-11-23 MED ORDER — CITALOPRAM HYDROBROMIDE 20 MG PO TABS: 20.0000 mg | ORAL_TABLET | Freq: Every day | ORAL | Status: DC

## 2011-11-23 MED ORDER — DARUNAVIR ETHANOLATE 800 MG PO TABS: 800.0000 mg | ORAL_TABLET | Freq: Every day | ORAL | Status: DC

## 2011-11-23 NOTE — Assessment & Plan Note (Signed)
Start Celexa referred to counselor.

## 2011-11-23 NOTE — Assessment & Plan Note (Signed)
Will work on this after we get his virus e controlled

## 2011-11-23 NOTE — Progress Notes (Signed)
Subjective:    Patient ID: Arthur [name], male    DOB: 10-Jul-1981, 31 y.o.   MRN: [id number]  HPI  31 year old [place] American male with HIV and AIDS previously on Atripla as prescribed by Dr. Jamey Reas. 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 he had moved outside the state of [place] had been out of care. He's been off antiviral therapy. Most recent CD4 count has dropped in the 200 range.  We have reviewed possible regimens I have prescribed the following recommended therapy for him which is prezista norvir and Truvada.  The patient has had quite a bit of trouble with depression adjusting to the fact that he has HIV in this diagnosis. He is mad at his former partner from whom he believes he contracted the HIV infection. I am going to start him on Celexa also referring her to a counselor here. I spent greater than 45 minutes with the patient including greater than 50% of time in face to face counsel of the patient and in coordination of their care.    Review of Systems  Constitutional: Negative for fever, chills, diaphoresis, activity change, appetite change, fatigue and unexpected weight change.  HENT: Negative for congestion, sore throat, rhinorrhea, sneezing, trouble swallowing and sinus pressure.   Eyes: Negative for photophobia and visual disturbance.  Respiratory: Negative for cough, chest tightness, shortness of breath, wheezing and stridor.   Cardiovascular: Negative for chest pain, palpitations and leg swelling.  Gastrointestinal: Negative for nausea, vomiting, abdominal pain, diarrhea, constipation, blood in stool, abdominal distention and anal bleeding.  Genitourinary: Negative for dysuria, hematuria, flank pain and difficulty urinating.  Musculoskeletal: Negative for myalgias, back pain, joint swelling, arthralgias and gait problem.  Skin: Negative for color change,  pallor, rash and wound.  Neurological: Negative for dizziness, tremors, weakness and light-headedness.  Hematological: Negative for adenopathy. Does not bruise/bleed easily.  Psychiatric/Behavioral: Positive for dysphoric mood. Negative for behavioral problems, confusion, sleep disturbance, decreased concentration and agitation.       Objective:   Physical Exam  Constitutional: He is oriented to person, place, and time. He appears well-developed and well-nourished. No distress.  HENT:  Head: Normocephalic and atraumatic.  Mouth/Throat: Oropharynx is clear and moist. No oropharyngeal exudate.  Eyes: Conjunctivae and EOM are normal. Pupils are equal, round, and reactive to light. No scleral icterus.  Neck: Normal range of motion. Neck supple. No JVD present.  Cardiovascular: Normal rate, regular rhythm and normal heart sounds.  Exam reveals no gallop and no friction rub.   No murmur heard. Pulmonary/Chest: Effort normal and breath sounds normal. No respiratory distress. He has no wheezes. He has no rales. He exhibits no tenderness.  Abdominal: He exhibits no distension and no mass. There is no tenderness. There is no rebound and no guarding.  Musculoskeletal: He exhibits no edema and no tenderness.  Lymphadenopathy:    He has no cervical adenopathy.  Neurological: He is alert and oriented to person, place, and time. He has normal reflexes. He exhibits normal muscle tone. Coordination normal.  Skin: Skin is warm and dry. He is not diaphoretic. No erythema. No pallor.  Psychiatric: His behavior is normal. Judgment and thought content normal. He exhibits a depressed mood.          Assessment & Plan:  HIV DISEASE Check viral load and CD4 count start prezista norvir and Truvada bring back in 1 month's time for recheck a viral load  and CD4 counts and followup appointment.  NICOTINE ADDICTION Will work on this after we get his virus e controlled  ANXIETY DEPRESSION Start Celexa referred  to counselor.  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 Will therefore given 3 shots of intramuscular penicillin.

## 2011-11-23 NOTE — Assessment & Plan Note (Signed)
Check viral load and CD4 count start prezista norvir and Truvada bring back in 1 month's time for recheck a viral load and CD4 counts and followup appointment.

## 2011-11-23 NOTE — Assessment & Plan Note (Signed)
He states he received one shot of penicillin out of state he does not not recall anything that sounds like primary secondary syphilis. Will therefore given 3 shots of intramuscular penicillin.

## 2011-11-23 NOTE — Patient Instructions (Signed)
WITH LABS  YOUR NEW REGIMEN IS:  PREZISTA 800MG  (ORANGE PILL) NORVIR 100MG  (WHITE PILL)  AND   TRUVADA (BLUE PILL)  ALL TAKEN ONCE DAILY EVERY DAY

## 2011-11-24 ENCOUNTER — Ambulatory Visit: Payer: Self-pay

## 2011-11-24 LAB — T-HELPER CELL (CD4) - (RCID CLINIC ONLY): CD4 % Helper T Cell: 17 % — ABNORMAL LOW (ref 33–55)

## 2011-12-01 ENCOUNTER — Ambulatory Visit: Payer: Self-pay

## 2011-12-12 ENCOUNTER — Other Ambulatory Visit: Payer: Self-pay | Admitting: *Deleted

## 2011-12-12 DIAGNOSIS — B20 Human immunodeficiency virus [HIV] disease: Secondary | ICD-10-CM

## 2011-12-12 MED ORDER — DARUNAVIR ETHANOLATE 800 MG PO TABS: 800.0000 mg | ORAL_TABLET | Freq: Every day | ORAL | Status: DC

## 2011-12-12 MED ORDER — EMTRICITABINE-TENOFOVIR DF 200-300 MG PO TABS: 1.0000 | ORAL_TABLET | Freq: Every day | ORAL | Status: DC

## 2011-12-12 MED ORDER — RITONAVIR 100 MG PO TABS: 100.0000 mg | ORAL_TABLET | Freq: Every day | ORAL | Status: DC

## 2011-12-12 NOTE — Telephone Encounter (Signed)
ADAP approval.  rxes to be mailed to pt.

## 2011-12-13 ENCOUNTER — Other Ambulatory Visit: Payer: Self-pay | Admitting: *Deleted

## 2011-12-13 DIAGNOSIS — B20 Human immunodeficiency virus [HIV] disease: Secondary | ICD-10-CM

## 2011-12-13 DIAGNOSIS — F341 Dysthymic disorder: Secondary | ICD-10-CM

## 2011-12-13 MED ORDER — EMTRICITABINE-TENOFOVIR DF 200-300 MG PO TABS: 1.0000 | ORAL_TABLET | Freq: Every day | ORAL | Status: DC

## 2011-12-13 MED ORDER — RITONAVIR 100 MG PO TABS: 100.0000 mg | ORAL_TABLET | Freq: Every day | ORAL | Status: DC

## 2011-12-13 MED ORDER — CITALOPRAM HYDROBROMIDE 20 MG PO TABS: 20.0000 mg | ORAL_TABLET | Freq: Every day | ORAL | Status: DC

## 2011-12-13 MED ORDER — DARUNAVIR ETHANOLATE 800 MG PO TABS: 800.0000 mg | ORAL_TABLET | Freq: Every day | ORAL | Status: DC

## 2011-12-13 NOTE — Telephone Encounter (Signed)
Needed to have selected "normal" rather than "print."

## 2011-12-28 ENCOUNTER — Other Ambulatory Visit: Payer: Self-pay

## 2012-01-03 ENCOUNTER — Other Ambulatory Visit: Payer: Self-pay | Admitting: *Deleted

## 2012-01-03 DIAGNOSIS — B20 Human immunodeficiency virus [HIV] disease: Secondary | ICD-10-CM

## 2012-01-03 DIAGNOSIS — F341 Dysthymic disorder: Secondary | ICD-10-CM

## 2012-01-03 MED ORDER — RITONAVIR 100 MG PO TABS: 100.0000 mg | ORAL_TABLET | Freq: Every day | ORAL | Status: DC

## 2012-01-03 MED ORDER — DARUNAVIR ETHANOLATE 800 MG PO TABS: 800.0000 mg | ORAL_TABLET | Freq: Every day | ORAL | Status: DC

## 2012-01-03 MED ORDER — EMTRICITABINE-TENOFOVIR DF 200-300 MG PO TABS: 1.0000 | ORAL_TABLET | Freq: Every day | ORAL | Status: DC

## 2012-01-03 MED ORDER — CITALOPRAM HYDROBROMIDE 20 MG PO TABS: 20.0000 mg | ORAL_TABLET | Freq: Every day | ORAL | Status: DC

## 2012-01-11 ENCOUNTER — Ambulatory Visit (INDEPENDENT_AMBULATORY_CARE_PROVIDER_SITE_OTHER): Payer: Self-pay | Admitting: Infectious Disease

## 2012-01-11 ENCOUNTER — Encounter: Payer: Self-pay | Admitting: Infectious Disease

## 2012-01-11 VITALS — BP 125/79 | HR 101 | Temp 98.5°F | Ht 71.0 in | Wt 152.0 lb

## 2012-01-11 DIAGNOSIS — F1910 Other psychoactive substance abuse, uncomplicated: Secondary | ICD-10-CM

## 2012-01-11 DIAGNOSIS — A529 Late syphilis, unspecified: Secondary | ICD-10-CM

## 2012-01-11 DIAGNOSIS — B20 Human immunodeficiency virus [HIV] disease: Secondary | ICD-10-CM

## 2012-01-11 DIAGNOSIS — F1990 Other psychoactive substance use, unspecified, uncomplicated: Secondary | ICD-10-CM

## 2012-01-11 LAB — CBC WITH DIFFERENTIAL/PLATELET
Basophils Absolute: 0 10*3/uL (ref 0.0–0.1)
Basophils Relative: 1 % (ref 0–1)
Eosinophils Relative: 1 % (ref 0–5)
HCT: 40.3 % (ref 39.0–52.0)
Hemoglobin: 13.1 g/dL (ref 13.0–17.0)
MCH: 30.9 pg (ref 26.0–34.0)
MCHC: 32.5 g/dL (ref 30.0–36.0)
MCV: 95 fL (ref 78.0–100.0)
Monocytes Absolute: 0.4 10*3/uL (ref 0.1–1.0)
Monocytes Relative: 12 % (ref 3–12)
RDW: 14.2 % (ref 11.5–15.5)

## 2012-01-11 LAB — COMPLETE METABOLIC PANEL WITH GFR
ALT: 15 U/L (ref 0–53)
Albumin: 4 g/dL (ref 3.5–5.2)
CO2: 25 mEq/L (ref 19–32)
Calcium: 8.8 mg/dL (ref 8.4–10.5)
Chloride: 105 mEq/L (ref 96–112)
Creat: 1.06 mg/dL (ref 0.50–1.35)
GFR, Est African American: >89 mL/min
Potassium: 4.1 mEq/L (ref 3.5–5.3)
Sodium: 139 mEq/L (ref 135–145)
Total Protein: 8.7 g/dL — ABNORMAL HIGH (ref 6.0–8.3)

## 2012-01-11 MED ORDER — RITONAVIR 100 MG PO TABS: 100.0000 mg | ORAL_TABLET | Freq: Every day | ORAL | Status: DC

## 2012-01-11 MED ORDER — DARUNAVIR ETHANOLATE 800 MG PO TABS: 800.0000 mg | ORAL_TABLET | Freq: Every day | ORAL | Status: DC

## 2012-01-11 MED ORDER — EMTRICITABINE-TENOFOVIR DF 200-300 MG PO TABS: 1.0000 | ORAL_TABLET | Freq: Every day | ORAL | Status: DC

## 2012-01-11 NOTE — Assessment & Plan Note (Signed)
Recheck RPR 

## 2012-01-11 NOTE — Assessment & Plan Note (Signed)
Get the meds picked up. Check vl and cd4 today. May need PCP prophylaxis if t cell has dropped. Rechck numbers in 4 wks

## 2012-01-11 NOTE — Assessment & Plan Note (Signed)
Confront about various subtances. I dont have huge problem with him using thc but I want to make sure he is taking his ARV andthat I can get him off tobacco

## 2012-01-11 NOTE — Progress Notes (Signed)
Subjective:    Patient ID: Arthur [name], male    DOB: [birth year], 31 y.o.   MRN: [id number]  HPI  31 year old [place] American male with HIV and AIDS previously on Atripla as prescribed by Dr. Jamey Reas. 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 he had moved outside the state of [place] had been out of care. He had been off antiviral therapy. Most recent CD4 count has dropped in the 200 range. I had given him scripts for prezista norvir and Truvada at end of February and he was [facility]AP aproved but he never got scripts because "nobody called me." I have resent scripts to [facility] on [address] a few blocks from our clinic. I expect him to fill them and start ARV and come back for repeat blood work in 4 weeks time. Pt had bloodshot eyes and appeared to be intoxicated, presumably from marijuana. I spent greater than 45 minutes with the patient including greater than 50% of time in face to face counsel of the patient and in coordination of their care.    Review of Systems  Constitutional: Negative for fever, chills, diaphoresis, activity change, appetite change, fatigue and unexpected weight change.  HENT: Negative for congestion, sore throat, rhinorrhea, sneezing, trouble swallowing and sinus pressure.   Eyes: Negative for photophobia and visual disturbance.  Respiratory: Negative for cough, chest tightness, shortness of breath, wheezing and stridor.   Cardiovascular: Negative for chest pain, palpitations and leg swelling.  Gastrointestinal: Negative for nausea, vomiting, abdominal pain, diarrhea, constipation, blood in stool, abdominal distention and anal bleeding.  Genitourinary: Negative for dysuria, hematuria, flank pain and difficulty urinating.  Musculoskeletal: Negative for myalgias, back pain, joint swelling, arthralgias and gait problem.  Skin: Negative for color change, pallor, rash and  wound.  Neurological: Negative for dizziness, tremors, weakness and light-headedness.  Hematological: Negative for adenopathy. Does not bruise/bleed easily.  Psychiatric/Behavioral: Negative for behavioral problems, confusion, sleep disturbance, dysphoric mood, decreased concentration and agitation.       Objective:   Physical Exam  Constitutional: He is oriented to person, place, and time. He appears well-developed and well-nourished. No distress.  HENT:  Head: Normocephalic and atraumatic.  Mouth/Throat: Oropharynx is clear and moist. No oropharyngeal exudate.  Eyes: Conjunctivae and EOM are normal. Pupils are equal, round, and reactive to light. No scleral icterus.  Neck: Normal range of motion. Neck supple. No JVD present.  Cardiovascular: Normal rate, regular rhythm and normal heart sounds.  Exam reveals no gallop and no friction rub.   No murmur heard. Pulmonary/Chest: Effort normal and breath sounds normal. No respiratory distress. He has no wheezes. He has no rales. He exhibits no tenderness.  Abdominal: He exhibits no distension and no mass. There is no tenderness. There is no rebound and no guarding.  Musculoskeletal: He exhibits no edema and no tenderness.  Lymphadenopathy:    He has no cervical adenopathy.  Neurological: He is alert and oriented to person, place, and time. He has normal reflexes. He exhibits normal muscle tone. Coordination normal.  Skin: Skin is warm and dry. He is not diaphoretic. No erythema. No pallor.  Psychiatric: He has a normal mood and affect. Judgment and thought content normal.       Distracted appearing He is inattentive.          Assessment & Plan:  HIV DISEASE Get the meds picked up. Check vl and cd4 today. May need PCP prophylaxis if  t cell has dropped. Rechck numbers in 4 wks  Late syphilis Recheck RPR  Drug use Confront about various subtances. I dont have huge problem with him using thc but I want to make sure he is taking his ARV  andthat I can get him off tobacco

## 2012-02-07 ENCOUNTER — Encounter: Payer: Self-pay | Admitting: *Deleted

## 2012-02-08 ENCOUNTER — Other Ambulatory Visit: Payer: Self-pay

## 2012-02-22 ENCOUNTER — Telehealth: Payer: Self-pay | Admitting: *Deleted

## 2012-02-22 ENCOUNTER — Ambulatory Visit: Payer: Self-pay | Admitting: Infectious Disease

## 2012-02-22 NOTE — Telephone Encounter (Signed)
I called his phone # but he was "unavailable" per phone message

## 2012-02-29 NOTE — Telephone Encounter (Signed)
Last appt here was given ARV. md wanted him back in 1 month for repeat labs.  Arthur Rank RN sent him a letter. Phone states "the person you have called is unavailable right now"

## 2012-04-14 ENCOUNTER — Emergency Department (INDEPENDENT_AMBULATORY_CARE_PROVIDER_SITE_OTHER)
Admission: EM | Admit: 2012-04-14 | Discharge: 2012-04-14 | Disposition: A | Discharge status: Home / Self Care | Payer: Medicaid Other | Source: Home / Self Care | Attending: Emergency Medicine | Admitting: Emergency Medicine

## 2012-04-14 ENCOUNTER — Encounter (HOSPITAL_COMMUNITY): Payer: Self-pay | Admitting: Emergency Medicine

## 2012-04-14 DIAGNOSIS — K089 Disorder of teeth and supporting structures, unspecified: Secondary | ICD-10-CM

## 2012-04-14 DIAGNOSIS — K047 Periapical abscess without sinus: Secondary | ICD-10-CM

## 2012-04-14 DIAGNOSIS — K0889 Other specified disorders of teeth and supporting structures: Secondary | ICD-10-CM

## 2012-04-14 MED ORDER — HYDROCODONE-ACETAMINOPHEN 5-500 MG PO TABS: 1.0000 | ORAL_TABLET | Freq: Four times a day (QID) | ORAL | Status: AC | PRN

## 2012-04-14 MED ORDER — PENICILLIN V POTASSIUM 500 MG PO TABS: 500.0000 mg | ORAL_TABLET | Freq: Four times a day (QID) | ORAL | Status: AC

## 2012-04-14 NOTE — ED Provider Notes (Signed)
History     CSN: [id number]  Arrival date & time 04/14/12  1139   First MD Initiated Contact with Patient 04/14/12 1205      Chief Complaint  Patient presents with  . Dental Pain    (Consider location/radiation/quality/duration/timing/severity/associated sxs/prior treatment) HPI Comments: Patient presents to urgent care complaining that the left lower side of his mouth started hurting about 2-3 days ago and his thumb which is becoming swollen along with his face. He has had previous infections of the same tooth and it was attempted to be removed in the past. He describes he has a oral surgeon or dentist and will remove the remaining of his tooth and another nearby city. Patient denies any fevers at this point but a constant throbbing pain from a left lower molar  Patient is a 31 y.o. male presenting with tooth pain.  Dental PainThe primary symptoms include mouth pain and dental injury. Primary symptoms do not include headaches, fever, shortness of breath, sore throat or cough. The symptoms began 3 to 5 days ago. The symptoms are unchanged. The symptoms occur constantly.  Additional symptoms include: dental sensitivity to temperature, gum swelling, gum tenderness and facial swelling. Additional symptoms do not include: jaw pain, swollen glands, goiter and fatigue.    History reviewed. No pertinent past medical history.  Past Surgical History  Procedure Date  . Tooth extraction     No family history on file.  History  Substance Use Topics  . Smoking status: Current Everyday Smoker -- 0.5 packs/day  . Smokeless tobacco: Not on file  . Alcohol Use: No      Review of Systems  Constitutional: Negative for fever, activity change and fatigue.  HENT: Positive for facial swelling. Negative for sore throat.   Respiratory: Negative for cough and shortness of breath.   Neurological: Negative for headaches.    Allergies  Review of patient's allergies indicates no known  allergies.  Home Medications   Current Outpatient Rx  Name Route Sig Dispense Refill  . CITALOPRAM HYDROBROMIDE 20 MG PO TABS Oral Take 1 tablet (20 mg total) by mouth daily. 30 tablet 11  . DARUNAVIR ETHANOLATE 800 MG PO TABS Oral Take 1 tablet (800 mg total) by mouth daily. 30 tablet 11  . EMTRICITABINE-TENOFOVIR 200-300 MG PO TABS Oral Take 1 tablet by mouth daily. 30 tablet 11  . HYDROCODONE-ACETAMINOPHEN 5-500 MG PO TABS Oral Take 1-2 tablets by mouth every 6 (six) hours as needed for pain. 15 tablet 0  . PENICILLIN V POTASSIUM 500 MG PO TABS Oral Take 1 tablet (500 mg total) by mouth 4 (four) times daily. 20 tablet 0  . RITONAVIR 100 MG PO TABS Oral Take 1 tablet (100 mg total) by mouth daily. 30 tablet 11    BP 145/95  Pulse 81  Temp 99.7 F (37.6 C) (Oral)  Resp 19  SpO2 98%  Physical Exam  Nursing note and vitals reviewed. Constitutional: He appears well-developed and well-nourished.  HENT:  Head: No trismus in the jaw.  Mouth/Throat: No oral lesions. Abnormal dentition. Dental abscesses and dental caries present. No uvula swelling. No oropharyngeal exudate or tonsillar abscesses.  Eyes: Conjunctivae are normal.  Neck: Neck supple.  Skin: No erythema.    ED Course  Procedures (including critical care time)  Labs Reviewed - No data to display No results found.   1. Pain, dental   2. Dental abscess       MDM  Chronic dental and recurrent infections  Jimmie Molly, MD 04/14/12 2149

## 2012-04-14 NOTE — ED Notes (Signed)
Pt had a tooth pulled a few years ago and it broke when they pulled it. Pt stated that there is still part of his tooth in his gum. Pain to left lower side of mouth started hurting 2 days ago. Last night pt was unable to sleep because his mouth was swelling up and hurting.

## 2012-08-17 ENCOUNTER — Other Ambulatory Visit (HOSPITAL_COMMUNITY)
Admission: RE | Admit: 2012-08-17 | Discharge: 2012-08-17 | Disposition: A | Discharge status: Home / Self Care | Payer: Medicaid Other | Source: Ambulatory Visit | Attending: Infectious Disease | Admitting: Infectious Disease

## 2012-08-17 ENCOUNTER — Other Ambulatory Visit: Payer: Medicaid Other

## 2012-08-17 ENCOUNTER — Encounter: Payer: Self-pay | Admitting: *Deleted

## 2012-08-17 ENCOUNTER — Ambulatory Visit: Payer: Self-pay

## 2012-08-17 ENCOUNTER — Other Ambulatory Visit: Payer: Self-pay | Admitting: Infectious Disease

## 2012-08-17 DIAGNOSIS — Z113 Encounter for screening for infections with a predominantly sexual mode of transmission: Secondary | ICD-10-CM | POA: Insufficient documentation

## 2012-08-17 DIAGNOSIS — Z79899 Other long term (current) drug therapy: Secondary | ICD-10-CM

## 2012-08-17 DIAGNOSIS — B20 Human immunodeficiency virus [HIV] disease: Secondary | ICD-10-CM

## 2012-08-17 LAB — T-HELPER CELL (CD4) - (RCID CLINIC ONLY): CD4 T Cell Abs: 350 uL — ABNORMAL LOW (ref 400–2700)

## 2012-08-17 LAB — COMPLETE METABOLIC PANEL WITH GFR
ALT: 15 U/L (ref 0–53)
AST: 20 U/L (ref 0–37)
CO2: 29 mEq/L (ref 19–32)
Chloride: 102 mEq/L (ref 96–112)
Creat: 1.01 mg/dL (ref 0.50–1.35)
GFR, Est African American: >89 mL/min
Sodium: 136 mEq/L (ref 135–145)
Total Bilirubin: 0.3 mg/dL (ref 0.3–1.2)
Total Protein: 8.5 g/dL — ABNORMAL HIGH (ref 6.0–8.3)

## 2012-08-17 LAB — CBC WITH DIFFERENTIAL/PLATELET
Basophils Relative: 0 % (ref 0–1)
Eosinophils Absolute: 0.1 10*3/uL (ref 0.0–0.7)
Eosinophils Relative: 3 % (ref 0–5)
HCT: 44.3 % (ref 39.0–52.0)
Hemoglobin: 15.5 g/dL (ref 13.0–17.0)
Lymphs Abs: 1.7 10*3/uL (ref 0.7–4.0)
MCH: 31.8 pg (ref 26.0–34.0)
MCHC: 35 g/dL (ref 30.0–36.0)
MCV: 91 fL (ref 78.0–100.0)
Monocytes Absolute: 0.3 10*3/uL (ref 0.1–1.0)
Monocytes Relative: 8 % (ref 3–12)
Neutrophils Relative %: 33 % — ABNORMAL LOW (ref 43–77)
RBC: 4.87 MIL/uL (ref 4.22–5.81)

## 2012-08-17 LAB — RPR: RPR Ser Ql: REACTIVE — AB

## 2012-08-20 LAB — HIV-1 RNA QUANT-NO REFLEX-BLD
HIV 1 RNA Quant: 50 copies/mL — ABNORMAL HIGH (ref <20)
HIV-1 RNA Quant, Log: 1.7 {Log} — ABNORMAL HIGH (ref <1.30)

## 2012-08-20 LAB — T.PALLIDUM AB, TOTAL: T pallidum Antibodies (TP-PA): >8 S/CO — ABNORMAL HIGH (ref <0.90)

## 2012-10-01 ENCOUNTER — Telehealth: Payer: Self-pay | Admitting: Licensed Clinical Social Worker

## 2012-10-01 ENCOUNTER — Ambulatory Visit: Payer: Self-pay | Admitting: Infectious Disease

## 2012-10-01 NOTE — Telephone Encounter (Signed)
Patient no showed for office visit today, I called the number on file but it said unavailable.

## 2012-11-20 ENCOUNTER — Encounter (HOSPITAL_COMMUNITY): Payer: Self-pay | Admitting: *Deleted

## 2012-11-20 ENCOUNTER — Emergency Department (HOSPITAL_COMMUNITY)
Admission: EM | Admit: 2012-11-20 | Discharge: 2012-11-20 | Disposition: A | Discharge status: Home / Self Care | Payer: Medicaid Other | Source: Home / Self Care | Attending: Family Medicine | Admitting: Family Medicine

## 2012-11-20 DIAGNOSIS — K047 Periapical abscess without sinus: Secondary | ICD-10-CM

## 2012-11-20 MED ORDER — HYDROCODONE-ACETAMINOPHEN 5-325 MG PO TABS
1.0000 | ORAL_TABLET | Freq: Once | ORAL | Status: AC
  Administered 2012-11-20: 1 via ORAL

## 2012-11-20 MED ORDER — METRONIDAZOLE 500 MG PO TABS: 500.0000 mg | ORAL_TABLET | Freq: Three times a day (TID) | ORAL | Status: DC

## 2012-11-20 MED ORDER — HYDROCODONE-ACETAMINOPHEN 5-325 MG PO TABS: 2.0000 | ORAL_TABLET | Freq: Four times a day (QID) | ORAL | Status: DC | PRN

## 2012-11-20 MED ORDER — IBUPROFEN 600 MG PO TABS: 600.0000 mg | ORAL_TABLET | Freq: Three times a day (TID) | ORAL | Status: DC | PRN

## 2012-11-20 MED ORDER — PENICILLIN V POTASSIUM 500 MG PO TABS: 500.0000 mg | ORAL_TABLET | Freq: Four times a day (QID) | ORAL | Status: AC

## 2012-11-20 MED ORDER — HYDROCODONE-ACETAMINOPHEN 5-325 MG PO TABS
ORAL_TABLET | ORAL | Status: AC
  Filled 2012-11-20: qty 1

## 2012-11-20 NOTE — ED Provider Notes (Signed)
History     CSN: [id number]  Arrival date & time 11/20/12  1002   First MD Initiated Contact with Patient 11/20/12 1005      Chief Complaint  Patient presents with  . Facial Pain    (Consider location/radiation/quality/duration/timing/severity/associated sxs/prior treatment) HPI Comments: 32 year old male with history of HIV. Here complaining of left lower jaw pain and swelling for 2 days. Patient stated he had a dental fracture after he beat a hard object a few days ago. Bad pain swelling  this started yesterday. Denies fever or chills. Denies spontaneous drainage. Patient stated that he had a dentist but he missed several appointments and was dismissed from the clinic need to find a dentist that takes HIV patients outside of [place].    History reviewed. No pertinent past medical history.  Past Surgical History  Procedure Laterality Date  . Tooth extraction      History reviewed. No pertinent family history.  History  Substance Use Topics  . Smoking status: Current Every Day Smoker -- 0.50 packs/day  . Smokeless tobacco: Not on file  . Alcohol Use: No      Review of Systems  Constitutional: Negative for fever and chills.  HENT: Positive for facial swelling and dental problem. Negative for sore throat, neck pain and sinus pressure.   Respiratory: Negative for cough and shortness of breath.   Cardiovascular: Negative for chest pain.  Gastrointestinal: Negative for abdominal pain.  Skin: Negative for rash.  Neurological: Negative for headaches.  All other systems reviewed and are negative.    Allergies  Review of patient's allergies indicates no known allergies.  Home Medications   Current Outpatient Rx  Name  Route  Sig  Dispense  Refill  . citalopram (CELEXA) 20 MG tablet   Oral   Take 1 tablet (20 mg total) by mouth daily.   30 tablet   11   . Darunavir Ethanolate (PREZISTA) 800 MG tablet   Oral   Take 1 tablet (800 mg total) by mouth daily.   30  tablet   11   . emtricitabine-tenofovir (TRUVADA) 200-300 MG per tablet   Oral   Take 1 tablet by mouth daily.   30 tablet   11   . HYDROcodone-acetaminophen (NORCO/VICODIN) 5-325 MG per tablet   Oral   Take 2 tablets by mouth every 6 (six) hours as needed for pain.   15 tablet   0   . ibuprofen (ADVIL,MOTRIN) 600 MG tablet   Oral   Take 1 tablet (600 mg total) by mouth every 8 (eight) hours as needed for pain.   20 tablet   0   . metroNIDAZOLE (FLAGYL) 500 MG tablet   Oral   Take 1 tablet (500 mg total) by mouth 3 (three) times daily.   15 tablet   0   . penicillin v potassium (VEETID) 500 MG tablet   Oral   Take 1 tablet (500 mg total) by mouth 4 (four) times daily.   40 tablet   0   . ritonavir (NORVIR) 100 MG TABS   Oral   Take 1 tablet (100 mg total) by mouth daily.   30 tablet   11     BP 121/79  Pulse 93  Temp(Src) 99.1 F (37.3 C) (Oral)  SpO2 100%  Physical Exam  Nursing note and vitals reviewed. Constitutional: He is oriented to person, place, and time. He appears well-developed and well-nourished.  Very uncomfortable pacing back and forward due to pain.  HENT:  Poor dentition several fractured tooth, generalized gum swelling and pyorrhea. There is focal swelling and tenderness at left lower jaw. No pharyngeal erythema or exudates. oropharynx is moist.  Eyes: Conjunctivae are normal. No scleral icterus.  Neck: Neck supple.  Cardiovascular: Normal heart sounds.   Pulmonary/Chest: Breath sounds normal.  Lymphadenopathy:    He has no cervical adenopathy.  Neurological: He is alert and oriented to person, place, and time.  Skin: No rash noted. He is not diaphoretic.    ED Course  Procedures (including critical care time)  Labs Reviewed - No data to display No results found.   1. Dental abscess       MDM  Treated with Norco 325/5 mg oral x1 and topical lidocaine. Tried to perform a dental block to see if I could drain a fluctuant area  but patient was too uncomfortable which made it difficult to cooperate and was not able to complete procedure after injection of anesthesia. Prescribed penicillin and Flagyl. Also provided a prescription for Norco #20 pills. Patient is in the process of finding a dentist that would take HIV patients outside of [place]. I instructed patient to go to the emergency department if no improvement or worsening symptoms despite following treatment.         Sharin Grave, MD 11/25/12 1315

## 2012-11-20 NOTE — ED Notes (Signed)
Pt  Reports       That  He       Has  Pain l  Gum   X  2  Days   Ago  He  Reports  At  That time  He  Bit  Into a  Hard  Object         He  Report  Pain l  Lower  Gum  Area

## 2013-02-01 ENCOUNTER — Other Ambulatory Visit: Payer: Self-pay | Admitting: *Deleted

## 2013-02-01 DIAGNOSIS — B20 Human immunodeficiency virus [HIV] disease: Secondary | ICD-10-CM

## 2013-02-01 MED ORDER — EMTRICITABINE-TENOFOVIR DF 200-300 MG PO TABS: 1.0000 | ORAL_TABLET | Freq: Every day | ORAL | Status: DC

## 2013-02-01 MED ORDER — DARUNAVIR ETHANOLATE 800 MG PO TABS: 800.0000 mg | ORAL_TABLET | Freq: Every day | ORAL | Status: DC

## 2013-02-01 MED ORDER — RITONAVIR 100 MG PO TABS: 100.0000 mg | ORAL_TABLET | Freq: Every day | ORAL | Status: DC

## 2013-02-04 ENCOUNTER — Encounter: Payer: Self-pay | Admitting: Infectious Disease

## 2013-02-04 ENCOUNTER — Ambulatory Visit: Payer: Medicaid Other

## 2013-02-04 ENCOUNTER — Ambulatory Visit (INDEPENDENT_AMBULATORY_CARE_PROVIDER_SITE_OTHER): Payer: Self-pay | Admitting: Infectious Disease

## 2013-02-04 ENCOUNTER — Other Ambulatory Visit: Payer: Self-pay | Admitting: *Deleted

## 2013-02-04 VITALS — BP 119/76 | HR 83 | Temp 98.0°F | Ht 71.0 in | Wt 146.0 lb

## 2013-02-04 DIAGNOSIS — Z9119 Patient's noncompliance with other medical treatment and regimen: Secondary | ICD-10-CM

## 2013-02-04 DIAGNOSIS — F341 Dysthymic disorder: Secondary | ICD-10-CM

## 2013-02-04 DIAGNOSIS — B20 Human immunodeficiency virus [HIV] disease: Secondary | ICD-10-CM

## 2013-02-04 DIAGNOSIS — Z91199 Patient's noncompliance with other medical treatment and regimen due to unspecified reason: Secondary | ICD-10-CM

## 2013-02-04 DIAGNOSIS — F1910 Other psychoactive substance abuse, uncomplicated: Secondary | ICD-10-CM

## 2013-02-04 DIAGNOSIS — Z23 Encounter for immunization: Secondary | ICD-10-CM

## 2013-02-04 DIAGNOSIS — F1990 Other psychoactive substance use, unspecified, uncomplicated: Secondary | ICD-10-CM

## 2013-02-04 LAB — COMPLETE METABOLIC PANEL WITH GFR
Albumin: 3.4 g/dL — ABNORMAL LOW (ref 3.5–5.2)
Alkaline Phosphatase: 50 U/L (ref 39–117)
BUN: 11 mg/dL (ref 6–23)
CO2: 27 mEq/L (ref 19–32)
Calcium: 9.2 mg/dL (ref 8.4–10.5)
GFR, Est African American: >89 mL/min
GFR, Est Non African American: >89 mL/min
Glucose, Bld: 88 mg/dL (ref 70–99)
Potassium: 4.6 mEq/L (ref 3.5–5.3)

## 2013-02-04 LAB — CBC WITH DIFFERENTIAL/PLATELET
Basophils Relative: 1 % (ref 0–1)
HCT: 36.8 % — ABNORMAL LOW (ref 39.0–52.0)
Hemoglobin: 12.5 g/dL — ABNORMAL LOW (ref 13.0–17.0)
MCH: 30.6 pg (ref 26.0–34.0)
MCHC: 34 g/dL (ref 30.0–36.0)
Monocytes Absolute: 0.5 10*3/uL (ref 0.1–1.0)
Monocytes Relative: 13 % — ABNORMAL HIGH (ref 3–12)
Neutro Abs: 1.8 10*3/uL (ref 1.7–7.7)

## 2013-02-04 MED ORDER — DARUNAVIR ETHANOLATE 800 MG PO TABS: 800.0000 mg | ORAL_TABLET | Freq: Every day | ORAL | Status: DC

## 2013-02-04 MED ORDER — EMTRICITABINE-TENOFOVIR DF 200-300 MG PO TABS: 1.0000 | ORAL_TABLET | Freq: Every day | ORAL | Status: DC

## 2013-02-04 MED ORDER — CITALOPRAM HYDROBROMIDE 20 MG PO TABS: 20.0000 mg | ORAL_TABLET | Freq: Every day | ORAL | Status: DC

## 2013-02-04 MED ORDER — RITONAVIR 100 MG PO TABS: 100.0000 mg | ORAL_TABLET | Freq: Every day | ORAL | Status: DC

## 2013-02-04 NOTE — Progress Notes (Signed)
HPI: Arthur [name] is a 31 y.o. male here for f/u for HIV. He expresses perfect adherence since the last visit until one month ago when he had insurance issues and has been off treatment since. He says he tried to change from [facility] to [facility] because medicaid would pay for mailorder medications [facility] wouldn't.   Allergies: No Known Allergies  Vitals: Temp: 98 F (36.7 C) (05/12 1023) Temp src: Oral (05/12 1023) BP: 119/76 mmHg (05/12 1023) Pulse Rate: 83 (05/12 1023)  Past Medical History: No past medical history on file.  Social History: History   Social History  . Marital Status: Single    Spouse Name: N/A    Number of Children: N/A  . Years of Education: N/A   Social History Main Topics  . Smoking status: Current Every Day Smoker -- 0.50 packs/day  . Smokeless tobacco: Never Used  . Alcohol Use: No  . Drug Use: Yes    Special: Marijuana  . Sexually Active: Yes     Comment: pt. declined condoms   Other Topics Concern  . None   Social History Narrative  . None    Current Regimen: Darunavir , ritonavir, Truvada  Labs: HIV 1 RNA Quant (copies/mL)  Date Value  08/17/2012 50*  01/11/2012 41848*  11/23/2011 86578*     CD4 T Cell Abs (cmm)  Date Value  08/17/2012 350*  01/11/2012 470   11/23/2011 240*     Hep B S Ab (no units)  Date Value  05/01/2007 Negative      Hepatitis B Surface Ag (no units)  Date Value  05/01/2007 Negative      HCV Ab (no units)  Date Value  05/01/2007 Negative     CrCl: Estimated Creatinine Clearance: 101 ml/min (by C-G formula based on Cr of 1.01).  Lipids:    Component Value Date/Time   CHOL 153 09/26/2011 1041   TRIG 62 09/26/2011 1041   HDL 33* 09/26/2011 1041   CHOLHDL 4.6 09/26/2011 1041   VLDL 12 09/26/2011 1041   LDLCALC 108* 09/26/2011 1041    Assessment: Patient claims perfect adherence prior, but he was a poor historian and appeared to be under the influence of something at the time as evidence by his  rapidly changing mood and mydriasis. We talked about his transportation issues and advised that he actually can get mailorder with [facility]. He was also concerned about being off of his citalopram and I advised him to get it transferred to [facility] if he has insurance issues in the future as it is a $4 medication. Continuing a PI based regimen would be good for him due to the high barrier to resistance.  Recommendations: - Restart Darunavir, ritonavir, Truvada - He was instructed to call us if he has further coverage issues  Drue Stager, PharmD [facility] 02/04/2013, 12:53 PM

## 2013-02-04 NOTE — Progress Notes (Signed)
Subjective:    Patient ID: Arthur [name], male    DOB: August 20, 1981, 32 y.o.   MRN: [id number]  HPI  32 year-old [place] American male with HIV and AIDS previously on Atripla as prescribed by Dr. Jamey [name]. 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 he had moved outside the state of [place] had been out of care.  I saw him last in April and I had changed his ARV to prezista, norvir and [place]. His VL in Nov was 50 but he was NOT seen in [facility]. He apparently enrolled in a Medicaid Family plan--which was not appropriate and this is one of the MANY problems, obstacles he has had to being back on ARV. He has NOW finally re-applied for ADAP and should start his ARVS shortly. Will check labs today and in 4 weeks to see that he is taking them properly and fu with [place] in 6 weeks.  Pt was brought to clinic by bridge counselor, saw Arthur [name] with [facility]. My pharmacist and I spent greater than 45 minutes with the patient including greater than 50% of time in face to face counsel of the patient and in coordination of their care.  WRote letter for excuse from work.   Review of Systems  Constitutional: Negative for fever, chills, diaphoresis, activity change, appetite change, fatigue and unexpected weight change.  HENT: Negative for congestion, sore throat, rhinorrhea, sneezing, trouble swallowing and sinus pressure.   Eyes: Negative for photophobia and visual disturbance.  Respiratory: Negative for cough, chest tightness, shortness of breath, wheezing and stridor.   Cardiovascular: Negative for chest pain, palpitations and leg swelling.  Gastrointestinal: Negative for nausea, vomiting, abdominal pain, diarrhea, constipation, blood in stool, abdominal distention and anal bleeding.  Genitourinary: Negative for dysuria, hematuria, flank pain and difficulty urinating.  Musculoskeletal: Negative for myalgias, back  pain, joint swelling, arthralgias and gait problem.  Skin: Negative for color change, pallor, rash and wound.  Neurological: Negative for dizziness, tremors, weakness and light-headedness.  Hematological: Negative for adenopathy. Does not bruise/bleed easily.  Psychiatric/Behavioral: Negative for behavioral problems, confusion, sleep disturbance, dysphoric mood, decreased concentration and agitation.       Objective:   Physical Exam  Constitutional: He is oriented to person, place, and time. He appears well-developed and well-nourished. No distress.  HENT:  Head: Normocephalic and atraumatic.  Mouth/Throat: Oropharynx is clear and moist. No oropharyngeal exudate.  Eyes: Conjunctivae and EOM are normal. Pupils are equal, round, and reactive to light. No scleral icterus.  Neck: Normal range of motion. Neck supple. No JVD present.  Cardiovascular: Normal rate, regular rhythm and normal heart sounds.  Exam reveals no gallop and no friction rub.   No murmur heard. Pulmonary/Chest: Effort normal and breath sounds normal. No respiratory distress. He has no wheezes. He has no rales. He exhibits no tenderness.  Abdominal: He exhibits no distension and no mass. There is no tenderness. There is no rebound and no guarding.  Musculoskeletal: He exhibits no edema and no tenderness.  Lymphadenopathy:    He has no cervical adenopathy.  Neurological: He is alert and oriented to person, place, and time. He has normal reflexes. He exhibits normal muscle tone. Coordination normal.  Skin: Skin is warm and dry. He is not diaphoretic. No erythema. No pallor.  Psychiatric: He has a normal mood and affect. Judgment and thought content normal.  Distracted appearing He is inattentive.  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 He is on the RIGHT regimen given his problems  with compliance in the past  Noncompliance: engaged with bridge counselor

## 2013-02-05 LAB — HIV-1 RNA QUANT-NO REFLEX-BLD: HIV-1 RNA Quant, Log: 4.22 {Log} — ABNORMAL HIGH (ref <1.30)

## 2013-02-05 LAB — T-HELPER CELL (CD4) - (RCID CLINIC ONLY): CD4 T Cell Abs: 240 uL — ABNORMAL LOW (ref 400–2700)

## 2013-02-12 ENCOUNTER — Other Ambulatory Visit: Payer: Self-pay | Admitting: *Deleted

## 2013-02-12 DIAGNOSIS — B20 Human immunodeficiency virus [HIV] disease: Secondary | ICD-10-CM

## 2013-02-12 DIAGNOSIS — F341 Dysthymic disorder: Secondary | ICD-10-CM

## 2013-02-12 MED ORDER — EMTRICITABINE-TENOFOVIR DF 200-300 MG PO TABS: 1.0000 | ORAL_TABLET | Freq: Every day | ORAL | Status: DC

## 2013-02-12 MED ORDER — DARUNAVIR ETHANOLATE 800 MG PO TABS: 800.0000 mg | ORAL_TABLET | Freq: Every day | ORAL | Status: DC

## 2013-02-12 MED ORDER — CITALOPRAM HYDROBROMIDE 20 MG PO TABS: 20.0000 mg | ORAL_TABLET | Freq: Every day | ORAL | Status: DC

## 2013-02-12 MED ORDER — RITONAVIR 100 MG PO TABS: 100.0000 mg | ORAL_TABLET | Freq: Every day | ORAL | Status: DC

## 2013-02-13 ENCOUNTER — Encounter: Payer: Self-pay | Admitting: *Deleted

## 2013-02-13 DIAGNOSIS — F341 Dysthymic disorder: Secondary | ICD-10-CM

## 2013-02-13 DIAGNOSIS — B20 Human immunodeficiency virus [HIV] disease: Secondary | ICD-10-CM

## 2013-02-13 NOTE — Telephone Encounter (Signed)
Encounter opened in error-meds already filled.

## 2013-03-13 ENCOUNTER — Other Ambulatory Visit (INDEPENDENT_AMBULATORY_CARE_PROVIDER_SITE_OTHER): Payer: Self-pay

## 2013-03-13 DIAGNOSIS — Z79899 Other long term (current) drug therapy: Secondary | ICD-10-CM

## 2013-03-13 DIAGNOSIS — B20 Human immunodeficiency virus [HIV] disease: Secondary | ICD-10-CM

## 2013-03-13 LAB — CBC WITH DIFFERENTIAL/PLATELET
Basophils Absolute: 0 10*3/uL (ref 0.0–0.1)
Basophils Relative: 0 % (ref 0–1)
Eosinophils Relative: 5 % (ref 0–5)
HCT: 39.5 % (ref 39.0–52.0)
Hemoglobin: 13.4 g/dL (ref 13.0–17.0)
Lymphocytes Relative: 50 % — ABNORMAL HIGH (ref 12–46)
MCHC: 33.9 g/dL (ref 30.0–36.0)
MCV: 90.4 fL (ref 78.0–100.0)
Monocytes Absolute: 0.3 10*3/uL (ref 0.1–1.0)
Monocytes Relative: 11 % (ref 3–12)
RDW: 16.4 % — ABNORMAL HIGH (ref 11.5–15.5)

## 2013-03-13 LAB — LIPID PANEL
LDL Cholesterol: 101 mg/dL — ABNORMAL HIGH (ref 0–99)
Triglycerides: 123 mg/dL (ref <150)

## 2013-03-14 LAB — HIV-1 RNA QUANT-NO REFLEX-BLD
HIV 1 RNA Quant: 104 copies/mL — ABNORMAL HIGH (ref <20)
HIV-1 RNA Quant, Log: 2.02 {Log} — ABNORMAL HIGH (ref <1.30)

## 2013-03-14 LAB — COMPLETE METABOLIC PANEL WITH GFR
ALT: 12 U/L (ref 0–53)
AST: 18 U/L (ref 0–37)
Albumin: 4 g/dL (ref 3.5–5.2)
Alkaline Phosphatase: 54 U/L (ref 39–117)
Potassium: 4.5 mEq/L (ref 3.5–5.3)
Sodium: 134 mEq/L — ABNORMAL LOW (ref 135–145)
Total Bilirubin: 0.3 mg/dL (ref 0.3–1.2)
Total Protein: 8.7 g/dL — ABNORMAL HIGH (ref 6.0–8.3)

## 2013-03-14 LAB — T-HELPER CELL (CD4) - (RCID CLINIC ONLY): CD4 % Helper T Cell: 17 % — ABNORMAL LOW (ref 33–55)

## 2013-03-25 ENCOUNTER — Ambulatory Visit (INDEPENDENT_AMBULATORY_CARE_PROVIDER_SITE_OTHER): Payer: Self-pay | Admitting: Infectious Disease

## 2013-03-25 ENCOUNTER — Encounter: Payer: Self-pay | Admitting: Infectious Disease

## 2013-03-25 VITALS — BP 116/73 | HR 76 | Temp 97.5°F | Wt 145.0 lb

## 2013-03-25 DIAGNOSIS — B20 Human immunodeficiency virus [HIV] disease: Secondary | ICD-10-CM

## 2013-03-25 DIAGNOSIS — M62838 Other muscle spasm: Secondary | ICD-10-CM

## 2013-03-25 DIAGNOSIS — F1910 Other psychoactive substance abuse, uncomplicated: Secondary | ICD-10-CM

## 2013-03-25 DIAGNOSIS — R112 Nausea with vomiting, unspecified: Secondary | ICD-10-CM | POA: Insufficient documentation

## 2013-03-25 MED ORDER — ONDANSETRON HCL 4 MG PO TABS: 4.0000 mg | ORAL_TABLET | Freq: Three times a day (TID) | ORAL | Status: DC | PRN

## 2013-03-25 MED ORDER — CYCLOBENZAPRINE HCL 10 MG PO TABS: 10.0000 mg | ORAL_TABLET | Freq: Three times a day (TID) | ORAL | Status: DC | PRN

## 2013-03-25 NOTE — Progress Notes (Signed)
  Subjective:    Patient ID: Arthur [name], male    DOB: September 22, 1981, 32 y.o.   MRN: [id number]  HPI   32 year-old [place] American male with HIV and AIDS previously on Atripla as prescribed by Dr. Jamey Reas. 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 he had moved outside the state of [place] had been out of care.  I saw him last in April of 2013 and I had changed his ARV to prezista, norvir and [place]. His VL in Nov was 50 but he was NOT seen in [facility] until May when we got him back onto P/NT> with nice virological response.   He has been suffering from nausea with restarting his ARVS and I have offered him zofran.  He also c/o episodic back spasms.   Review of Systems  Constitutional: Negative for fever, chills, diaphoresis, activity change, appetite change, fatigue and unexpected weight change.  HENT: Negative for congestion, sore throat, rhinorrhea, sneezing, trouble swallowing and sinus pressure.   Eyes: Negative for photophobia and visual disturbance.  Respiratory: Negative for cough, chest tightness, shortness of breath, wheezing and stridor.   Cardiovascular: Negative for chest pain, palpitations and leg swelling.  Gastrointestinal: Negative for nausea, vomiting, abdominal pain, diarrhea, constipation, blood in stool, abdominal distention and anal bleeding.  Genitourinary: Negative for dysuria, hematuria, flank pain and difficulty urinating.  Musculoskeletal: Negative for myalgias, back pain, joint swelling, arthralgias and gait problem.  Skin: Negative for color change, pallor, rash and wound.  Neurological: Negative for dizziness, tremors, weakness and light-headedness.  Hematological: Negative for adenopathy. Does not bruise/bleed easily.  Psychiatric/Behavioral: Negative for behavioral problems, confusion, sleep disturbance, dysphoric mood, decreased concentration and  agitation.       Objective:   Physical Exam  Constitutional: He is oriented to person, place, and time. He appears well-developed and well-nourished. No distress.  HENT:  Head: Normocephalic and atraumatic.  Mouth/Throat: Oropharynx is clear and moist. No oropharyngeal exudate.  Eyes: Conjunctivae and EOM are normal. Pupils are equal, round, and reactive to light. No scleral icterus.  Neck: Normal range of motion. Neck supple. No JVD present.  Cardiovascular: Normal rate, regular rhythm and normal heart sounds.  Exam reveals no gallop and no friction rub.   No murmur heard. Pulmonary/Chest: Effort normal and breath sounds normal. No respiratory distress. He has no wheezes. He has no rales. He exhibits no tenderness.  Abdominal: He exhibits no distension and no mass. There is no tenderness. There is no rebound and no guarding.  Musculoskeletal: He exhibits no edema and no tenderness.  Lymphadenopathy:    He has no cervical adenopathy.  Neurological: He is alert and oriented to person, place, and time. He has normal reflexes. He exhibits normal muscle tone. Coordination normal.  Skin: Skin is warm and dry. He is not diaphoretic. No erythema. No pallor.  Psychiatric: He has a normal mood and affect. Judgment and thought content normal.  Distracted appearing He is inattentive.          Assessment & Plan:  HIV: continue P/N/T  Subtance abuse: should refer to [facility]  Noncompliance: engaged with bridge counselor   Muscle spasms: rx flexeril  Nausea: rx zofran

## 2013-05-02 ENCOUNTER — Other Ambulatory Visit: Payer: Self-pay | Admitting: Licensed Clinical Social Worker

## 2013-05-02 MED ORDER — AMOXICILLIN 500 MG PO CAPS: 500.0000 mg | ORAL_CAPSULE | Freq: Three times a day (TID) | ORAL | Status: DC

## 2013-05-30 ENCOUNTER — Other Ambulatory Visit (INDEPENDENT_AMBULATORY_CARE_PROVIDER_SITE_OTHER): Payer: Self-pay

## 2013-05-30 DIAGNOSIS — B20 Human immunodeficiency virus [HIV] disease: Secondary | ICD-10-CM

## 2013-05-30 DIAGNOSIS — Z113 Encounter for screening for infections with a predominantly sexual mode of transmission: Secondary | ICD-10-CM

## 2013-05-30 LAB — COMPLETE METABOLIC PANEL WITH GFR
ALT: 12 U/L (ref 0–53)
Albumin: 4.5 g/dL (ref 3.5–5.2)
CO2: 29 mEq/L (ref 19–32)
Calcium: 9.5 mg/dL (ref 8.4–10.5)
Chloride: 99 mEq/L (ref 96–112)
GFR, Est African American: >89 mL/min
GFR, Est Non African American: 85 mL/min
Glucose, Bld: 79 mg/dL (ref 70–99)
Potassium: 3.7 mEq/L (ref 3.5–5.3)
Sodium: 134 mEq/L — ABNORMAL LOW (ref 135–145)
Total Bilirubin: 0.3 mg/dL (ref 0.3–1.2)
Total Protein: 8.4 g/dL — ABNORMAL HIGH (ref 6.0–8.3)

## 2013-05-30 LAB — CBC WITH DIFFERENTIAL/PLATELET
Eosinophils Absolute: 0.1 10*3/uL (ref 0.0–0.7)
Hemoglobin: 15.4 g/dL (ref 13.0–17.0)
Lymphocytes Relative: 42 % (ref 12–46)
Lymphs Abs: 1.7 10*3/uL (ref 0.7–4.0)
MCH: 32.4 pg (ref 26.0–34.0)
Monocytes Relative: 12 % (ref 3–12)
Neutrophils Relative %: 44 % (ref 43–77)
Platelets: 337 10*3/uL (ref 150–400)
RBC: 4.75 MIL/uL (ref 4.22–5.81)
WBC: 4.2 10*3/uL (ref 4.0–10.5)

## 2013-05-30 LAB — PHOSPHORUS: Phosphorus: 3.5 mg/dL (ref 2.3–4.6)

## 2013-05-31 LAB — T-HELPER CELL (CD4) - (RCID CLINIC ONLY): CD4 % Helper T Cell: 23 % — ABNORMAL LOW (ref 33–55)

## 2013-05-31 LAB — HIV-1 RNA QUANT-NO REFLEX-BLD: HIV 1 RNA Quant: <20 copies/mL (ref <20)

## 2013-06-12 ENCOUNTER — Ambulatory Visit: Payer: Self-pay | Admitting: Infectious Disease

## 2013-06-13 ENCOUNTER — Ambulatory Visit: Payer: Self-pay | Admitting: Infectious Disease

## 2013-06-26 ENCOUNTER — Encounter: Payer: Self-pay | Admitting: Infectious Disease

## 2013-06-26 ENCOUNTER — Ambulatory Visit (INDEPENDENT_AMBULATORY_CARE_PROVIDER_SITE_OTHER): Payer: Self-pay | Admitting: Infectious Disease

## 2013-06-26 VITALS — BP 95/60 | HR 73 | Temp 97.9°F | Ht 71.0 in | Wt 147.0 lb

## 2013-06-26 DIAGNOSIS — B20 Human immunodeficiency virus [HIV] disease: Secondary | ICD-10-CM

## 2013-06-26 DIAGNOSIS — F329 Major depressive disorder, single episode, unspecified: Secondary | ICD-10-CM

## 2013-06-26 DIAGNOSIS — F341 Dysthymic disorder: Secondary | ICD-10-CM

## 2013-06-26 DIAGNOSIS — Z23 Encounter for immunization: Secondary | ICD-10-CM

## 2013-06-26 DIAGNOSIS — F1910 Other psychoactive substance abuse, uncomplicated: Secondary | ICD-10-CM

## 2013-06-26 DIAGNOSIS — Z9119 Patient's noncompliance with other medical treatment and regimen: Secondary | ICD-10-CM

## 2013-06-26 MED ORDER — CITALOPRAM HYDROBROMIDE 40 MG PO TABS: 40.0000 mg | ORAL_TABLET | Freq: Every day | ORAL | Status: DC

## 2013-06-26 NOTE — Progress Notes (Signed)
Subjective:    Patient ID: Arthur [name], male    DOB: Aug 20, 1981, 32 y.o.   MRN: [id number]  HPI   32 year-old [place] American male with HIV and AIDS previously on Atripla as prescribed by Dr. Jamey [name]. 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 he had moved outside the state of [place] had been out of care.  I saw him in April of 2013 and I had changed his ARV to prezista, norvir and [place]. His VL in Nov was 50 but he was NOT seen in [facility] until May 2014 when we got him back onto P/NT> with nice virological response.   His VL is undetectable and CD4 is healthy  He is depressed visibly and going thru stress of breakup with his boyfriend and trying to move out.   He is tolerating celexa well and I have offered increase the dose to 40mg .    Review of Systems  Constitutional: Negative for fever, chills, diaphoresis, activity change, appetite change, fatigue and unexpected weight change.  HENT: Negative for congestion, sore throat, rhinorrhea, sneezing, trouble swallowing and sinus pressure.   Eyes: Negative for photophobia and visual disturbance.  Respiratory: Negative for cough, chest tightness, shortness of breath, wheezing and stridor.   Cardiovascular: Negative for chest pain, palpitations and leg swelling.  Gastrointestinal: Negative for nausea, vomiting, abdominal pain, diarrhea, constipation, blood in stool, abdominal distention and anal bleeding.  Genitourinary: Negative for dysuria, hematuria, flank pain and difficulty urinating.  Musculoskeletal: Negative for myalgias, back pain, joint swelling, arthralgias and gait problem.  Skin: Negative for color change, pallor, rash and wound.  Neurological: Negative for dizziness, tremors, weakness and light-headedness.  Hematological: Negative for adenopathy. Does not bruise/bleed easily.  Psychiatric/Behavioral: Positive for  dysphoric mood. Negative for suicidal ideas, behavioral problems, confusion, sleep disturbance, self-injury, decreased concentration and agitation. The patient is nervous/anxious.        Objective:   Physical Exam  Constitutional: He is oriented to person, place, and time. He appears well-developed and well-nourished. No distress.  HENT:  Head: Normocephalic and atraumatic.  Mouth/Throat: Oropharynx is clear and moist. No oropharyngeal exudate.  Eyes: Conjunctivae and EOM are normal. Pupils are equal, round, and reactive to light. No scleral icterus.  Neck: Normal range of motion. Neck supple. No JVD present.  Cardiovascular: Normal rate, regular rhythm and normal heart sounds.  Exam reveals no gallop and no friction rub.   No murmur heard. Pulmonary/Chest: Effort normal and breath sounds normal. No respiratory distress. He has no wheezes. He has no rales. He exhibits no tenderness.  Abdominal: He exhibits no distension and no mass. There is no tenderness. There is no rebound and no guarding.  Musculoskeletal: He exhibits no edema and no tenderness.  Lymphadenopathy:    He has no cervical adenopathy.  Neurological: He is alert and oriented to person, place, and time. He has normal reflexes. He exhibits normal muscle tone. Coordination normal.  Skin: Skin is warm and dry. He is not diaphoretic. No erythema. No pallor.  Psychiatric: Judgment and thought content normal. His mood appears anxious. His speech is delayed. He is withdrawn. He exhibits a depressed mood. He is attentive.          Assessment & Plan:  HIV: continue P/N/T  Depression, undergoing breakup: referred to Arthur [name], increase celexa  Subtance abuse: should refer to Arthur [name]  Noncompliance: engaged with bridge counselor   Muscle spasms: continue flexeril  Nausea: rx zofran  HCM: flu shot

## 2013-07-24 ENCOUNTER — Other Ambulatory Visit: Payer: Self-pay | Admitting: *Deleted

## 2013-07-24 DIAGNOSIS — B20 Human immunodeficiency virus [HIV] disease: Secondary | ICD-10-CM

## 2013-07-24 MED ORDER — DARUNAVIR ETHANOLATE 800 MG PO TABS: 800.0000 mg | ORAL_TABLET | Freq: Every day | ORAL | Status: DC

## 2013-07-24 MED ORDER — RITONAVIR 100 MG PO TABS: 100.0000 mg | ORAL_TABLET | Freq: Every day | ORAL | Status: DC

## 2013-07-24 MED ORDER — EMTRICITABINE-TENOFOVIR DF 200-300 MG PO TABS: 1.0000 | ORAL_TABLET | Freq: Every day | ORAL | Status: DC

## 2013-07-29 ENCOUNTER — Encounter: Payer: Self-pay | Admitting: *Deleted

## 2013-08-19 ENCOUNTER — Other Ambulatory Visit: Payer: Self-pay

## 2013-08-21 ENCOUNTER — Other Ambulatory Visit (INDEPENDENT_AMBULATORY_CARE_PROVIDER_SITE_OTHER): Payer: Self-pay

## 2013-08-21 DIAGNOSIS — B20 Human immunodeficiency virus [HIV] disease: Secondary | ICD-10-CM

## 2013-08-21 LAB — T-HELPER CELL (CD4) - (RCID CLINIC ONLY)
CD4 % Helper T Cell: 27 % — ABNORMAL LOW (ref 33–55)
CD4 T Cell Abs: 510 /uL (ref 400–2700)

## 2013-08-21 LAB — CBC WITH DIFFERENTIAL/PLATELET
Basophils Absolute: 0 10*3/uL (ref 0.0–0.1)
Eosinophils Absolute: 0.1 10*3/uL (ref 0.0–0.7)
Eosinophils Relative: 4 % (ref 0–5)
HCT: 40.6 % (ref 39.0–52.0)
Hemoglobin: 14.3 g/dL (ref 13.0–17.0)
Lymphocytes Relative: 46 % (ref 12–46)
Lymphs Abs: 1.8 10*3/uL (ref 0.7–4.0)
MCH: 33 pg (ref 26.0–34.0)
MCV: 93.8 fL (ref 78.0–100.0)
Monocytes Absolute: 0.4 10*3/uL (ref 0.1–1.0)
Monocytes Relative: 11 % (ref 3–12)
Neutro Abs: 1.6 10*3/uL — ABNORMAL LOW (ref 1.7–7.7)
RBC: 4.33 MIL/uL (ref 4.22–5.81)
RDW: 13.8 % (ref 11.5–15.5)
WBC: 4 10*3/uL (ref 4.0–10.5)

## 2013-08-22 LAB — COMPLETE METABOLIC PANEL WITH GFR
Albumin: 4.3 g/dL (ref 3.5–5.2)
BUN: 12 mg/dL (ref 6–23)
CO2: 26 mEq/L (ref 19–32)
Calcium: 9.6 mg/dL (ref 8.4–10.5)
Chloride: 102 mEq/L (ref 96–112)
Creat: 1.25 mg/dL (ref 0.50–1.35)
GFR, Est African American: 89 mL/min
Potassium: 4.2 mEq/L (ref 3.5–5.3)

## 2013-09-02 ENCOUNTER — Ambulatory Visit: Payer: Self-pay | Admitting: Infectious Disease

## 2013-09-03 ENCOUNTER — Ambulatory Visit (INDEPENDENT_AMBULATORY_CARE_PROVIDER_SITE_OTHER): Payer: Self-pay | Admitting: Infectious Disease

## 2013-09-03 ENCOUNTER — Encounter: Payer: Self-pay | Admitting: Infectious Disease

## 2013-09-03 VITALS — BP 118/78 | HR 73 | Temp 97.8°F | Wt 142.4 lb

## 2013-09-03 DIAGNOSIS — M62838 Other muscle spasm: Secondary | ICD-10-CM

## 2013-09-03 DIAGNOSIS — B20 Human immunodeficiency virus [HIV] disease: Secondary | ICD-10-CM

## 2013-09-03 DIAGNOSIS — F329 Major depressive disorder, single episode, unspecified: Secondary | ICD-10-CM

## 2013-09-03 MED ORDER — CYCLOBENZAPRINE HCL 10 MG PO TABS: 10.0000 mg | ORAL_TABLET | Freq: Three times a day (TID) | ORAL | Status: DC | PRN

## 2013-09-03 NOTE — Progress Notes (Signed)
Subjective:    Patient ID: Arthur [name], male    DOB: 1980-11-09, 32 y.o.   MRN: [id number]  HPI   32 year-old [place] American male with HIV and AIDS previously on Atripla as prescribed by Dr. Jamey Reas. Mr. 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 he had moved outside the state of [place] had been out of care.  I saw him in April of 2013 and I had changed his ARV to prezista, norvir and [place]. His VL in Nov was 50 but he was NOT seen in [facility] until May 2014 when we got him back onto P/NT> with nice virological response.   His VL is essentially undetectable and CD4 is healthy.  He states he missed 3 day sof meds when they were incorrectly brought to business address rather than deposited at his place of residence. His mood has improved on celexa and with venting to his sisters who hare supportive. He does NOT have cell phone, sisters phone is best one to reach him.      Review of Systems  Constitutional: Negative for fever, chills, diaphoresis, activity change, appetite change, fatigue and unexpected weight change.  HENT: Negative for congestion, rhinorrhea, sinus pressure, sneezing, sore throat and trouble swallowing.   Eyes: Negative for photophobia and visual disturbance.  Respiratory: Negative for cough, chest tightness, shortness of breath, wheezing and stridor.   Cardiovascular: Negative for chest pain, palpitations and leg swelling.  Gastrointestinal: Negative for nausea, vomiting, abdominal pain, diarrhea, constipation, blood in stool, abdominal distention and anal bleeding.  Genitourinary: Negative for dysuria, hematuria, flank pain and difficulty urinating.  Musculoskeletal: Negative for arthralgias, back pain, gait problem, joint swelling and myalgias.  Skin: Negative for color change, pallor, rash and wound.  Neurological: Negative for dizziness, tremors, weakness and  light-headedness.  Hematological: Negative for adenopathy. Does not bruise/bleed easily.  Psychiatric/Behavioral: Negative for suicidal ideas, behavioral problems, confusion, sleep disturbance, self-injury, dysphoric mood, decreased concentration and agitation. The patient is not nervous/anxious.        Objective:   Physical Exam  Constitutional: He is oriented to person, place, and time. No distress.  HENT:  Head: Normocephalic and atraumatic.  Mouth/Throat: Oropharynx is clear and moist. No oropharyngeal exudate.  Eyes: Conjunctivae and EOM are normal. Pupils are equal, round, and reactive to light. No scleral icterus.  Neck: Normal range of motion. Neck supple. No JVD present.  Cardiovascular: Normal rate, regular rhythm and normal heart sounds.  Exam reveals no gallop and no friction rub.   No murmur heard. Pulmonary/Chest: Effort normal and breath sounds normal. No respiratory distress. He has no wheezes. He has no rales. He exhibits no tenderness.  Abdominal: He exhibits no distension and no mass. There is no tenderness. There is no rebound and no guarding.  Musculoskeletal: He exhibits no edema and no tenderness.  Lymphadenopathy:    He has no cervical adenopathy.  Neurological: He is alert and oriented to person, place, and time. He has normal reflexes. He exhibits normal muscle tone. Coordination normal.  Skin: Skin is warm and dry. He is not diaphoretic. No erythema. No pallor.  Psychiatric: Judgment and thought content normal. His speech is not delayed. He is not withdrawn. He is attentive.          Assessment & Plan:  HIV: continue P/N/T, recheck labs in 2 months. I spent greater than 20 minutes with the patient including greater than 50% of  time in face to face counsel of the patient and in coordination of their care.   Depression, continue celexa  Noncompliance: engaged with bridge counselor but he will have to let [employer]" from his services soon. Will need  close monitoring   Muscle spasms: continue flexeril

## 2013-10-22 ENCOUNTER — Other Ambulatory Visit: Payer: Self-pay

## 2013-10-29 ENCOUNTER — Other Ambulatory Visit (INDEPENDENT_AMBULATORY_CARE_PROVIDER_SITE_OTHER): Payer: Self-pay

## 2013-10-29 DIAGNOSIS — B20 Human immunodeficiency virus [HIV] disease: Secondary | ICD-10-CM

## 2013-10-29 LAB — COMPLETE METABOLIC PANEL WITH GFR
ALBUMIN: 4.3 g/dL (ref 3.5–5.2)
ALT: 10 U/L (ref 0–53)
AST: 15 U/L (ref 0–37)
Alkaline Phosphatase: 57 U/L (ref 39–117)
BUN: 8 mg/dL (ref 6–23)
CALCIUM: 9.8 mg/dL (ref 8.4–10.5)
CHLORIDE: 102 meq/L (ref 96–112)
CO2: 29 meq/L (ref 19–32)
Creat: 1.08 mg/dL (ref 0.50–1.35)
GLUCOSE: 81 mg/dL (ref 70–99)
POTASSIUM: 4.6 meq/L (ref 3.5–5.3)
SODIUM: 139 meq/L (ref 135–145)
TOTAL PROTEIN: 7.7 g/dL (ref 6.0–8.3)
Total Bilirubin: 0.6 mg/dL (ref 0.2–1.2)

## 2013-10-29 LAB — CBC WITH DIFFERENTIAL/PLATELET
Basophils Absolute: 0 10*3/uL (ref 0.0–0.1)
Basophils Relative: 0 % (ref 0–1)
Eosinophils Absolute: 0.2 10*3/uL (ref 0.0–0.7)
Eosinophils Relative: 3 % (ref 0–5)
HCT: 42.6 % (ref 39.0–52.0)
Hemoglobin: 14.7 g/dL (ref 13.0–17.0)
LYMPHS ABS: 1.6 10*3/uL (ref 0.7–4.0)
Lymphocytes Relative: 31 % (ref 12–46)
MCH: 32.6 pg (ref 26.0–34.0)
MCHC: 34.5 g/dL (ref 30.0–36.0)
MCV: 94.5 fL (ref 78.0–100.0)
MONO ABS: 0.4 10*3/uL (ref 0.1–1.0)
MONOS PCT: 8 % (ref 3–12)
NEUTROS ABS: 2.9 10*3/uL (ref 1.7–7.7)
NEUTROS PCT: 58 % (ref 43–77)
Platelets: 398 10*3/uL (ref 150–400)
RBC: 4.51 MIL/uL (ref 4.22–5.81)
RDW: 13.5 % (ref 11.5–15.5)
WBC: 5 10*3/uL (ref 4.0–10.5)

## 2013-10-30 LAB — T-HELPER CELL (CD4) - (RCID CLINIC ONLY)
CD4 % Helper T Cell: 26 % — ABNORMAL LOW (ref 33–55)
CD4 T CELL ABS: 450 /uL (ref 400–2700)

## 2013-10-31 LAB — HIV-1 RNA QUANT-NO REFLEX-BLD

## 2013-11-05 ENCOUNTER — Ambulatory Visit: Payer: Self-pay | Admitting: Infectious Disease

## 2013-11-15 ENCOUNTER — Ambulatory Visit: Payer: Self-pay | Admitting: *Deleted

## 2013-11-15 ENCOUNTER — Ambulatory Visit (INDEPENDENT_AMBULATORY_CARE_PROVIDER_SITE_OTHER): Payer: Self-pay | Admitting: Infectious Disease

## 2013-11-15 ENCOUNTER — Encounter: Payer: Self-pay | Admitting: Infectious Disease

## 2013-11-15 VITALS — BP 121/81 | HR 76 | Temp 98.1°F | Ht 71.0 in | Wt 147.0 lb

## 2013-11-15 DIAGNOSIS — B20 Human immunodeficiency virus [HIV] disease: Secondary | ICD-10-CM

## 2013-11-15 DIAGNOSIS — F3289 Other specified depressive episodes: Secondary | ICD-10-CM

## 2013-11-15 DIAGNOSIS — F32A Depression, unspecified: Secondary | ICD-10-CM

## 2013-11-15 DIAGNOSIS — F329 Major depressive disorder, single episode, unspecified: Secondary | ICD-10-CM

## 2013-11-15 NOTE — Progress Notes (Signed)
  Subjective:    Patient ID: Arthur [name], male    DOB: 03/04/1981, 33 y.o.   MRN: [id number]  HPI   33 year-old [place] American male with HIV and AIDS previously on Atripla as prescribed by Dr. Jamey ReasKelley [name]. 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 [place] out [place].  I saw him in April of 2013 and I had changed his ARV to prezista, norvir and [place]. His VL in Nov was 50 but he was NOT seen in [facility] until May 2014 when we got him back onto P/NT> with nice virological response.   His VL is undetectable and CD4 is healthy.  He is doing well.   Review of Systems  Constitutional: Negative for fever, chills, diaphoresis, activity change, appetite change, fatigue and unexpected weight change.  HENT: Negative for congestion, rhinorrhea, sinus pressure, sneezing, sore throat and trouble swallowing.   Eyes: Negative for photophobia and visual disturbance.  Respiratory: Negative for cough, chest tightness, shortness of breath, wheezing and stridor.   Cardiovascular: Negative for chest pain, palpitations and leg swelling.  Gastrointestinal: Negative for nausea, vomiting, abdominal pain, diarrhea, constipation, blood in stool, abdominal distention and anal bleeding.  Genitourinary: Negative for dysuria, hematuria, flank pain and difficulty urinating.  Musculoskeletal: Negative for arthralgias, back pain, gait problem, joint swelling and myalgias.  Skin: Negative for color change, pallor, rash and wound.  Neurological: Negative for dizziness, tremors, weakness and light-headedness.  Hematological: Negative for adenopathy. Does not bruise/bleed easily.  Psychiatric/Behavioral: Negative for suicidal ideas, behavioral problems, confusion, sleep disturbance, self-injury, dysphoric mood, decreased concentration and agitation. The patient is not  nervous/anxious.        Objective:   Physical Exam  Constitutional: He is oriented to person, place, and time. No distress.  HENT:  Head: Normocephalic and atraumatic.  Mouth/Throat: Oropharynx is clear and moist. No oropharyngeal exudate.  Eyes: Conjunctivae and EOM are normal. Pupils are equal, round, and reactive to light. No scleral icterus.  Neck: Normal range of motion. Neck supple. No JVD present.  Cardiovascular: Normal rate, regular rhythm and normal heart sounds.  Exam reveals no gallop and no friction rub.   No murmur heard. Pulmonary/Chest: Effort normal and breath sounds normal. No respiratory distress. He has no wheezes. He has no rales. He exhibits no tenderness.  Abdominal: He exhibits no distension and no mass. There is no tenderness. There is no rebound and no guarding.  Musculoskeletal: He exhibits no edema and no tenderness.  Lymphadenopathy:    He has no cervical adenopathy.  Neurological: He is alert and oriented to person, place, and time. He has normal reflexes. He exhibits normal muscle tone. Coordination normal.  Skin: Skin is warm and dry. He is not diaphoretic. No erythema. No pallor.  Psychiatric: Judgment and thought content normal. His speech is not delayed. He is not withdrawn. He is attentive.          Assessment & Plan:  HIV: continue P/N/T for now but when available change to Prezcobix and Truvada--he will come back to see me in one months time to see if available andmake switch  I spent greater than 20 minutes with the patient including greater than 50% of time in face to face counsel of the patient and in coordination of their care.   Depression, continue celexa

## 2013-11-18 ENCOUNTER — Other Ambulatory Visit: Payer: Self-pay | Admitting: *Deleted

## 2013-11-18 DIAGNOSIS — B20 Human immunodeficiency virus [HIV] disease: Secondary | ICD-10-CM

## 2013-11-18 MED ORDER — EMTRICITABINE-TENOFOVIR DF 200-300 MG PO TABS: 1.0000 | ORAL_TABLET | Freq: Every day | ORAL | Status: DC

## 2013-11-18 MED ORDER — RITONAVIR 100 MG PO TABS: 100.0000 mg | ORAL_TABLET | Freq: Every day | ORAL | Status: DC

## 2013-11-18 MED ORDER — DARUNAVIR ETHANOLATE 800 MG PO TABS: 800.0000 mg | ORAL_TABLET | Freq: Every day | ORAL | Status: DC

## 2013-12-16 ENCOUNTER — Ambulatory Visit: Payer: Self-pay | Admitting: Infectious Disease

## 2013-12-19 ENCOUNTER — Ambulatory Visit: Payer: Self-pay | Admitting: Infectious Disease

## 2014-02-06 ENCOUNTER — Other Ambulatory Visit: Payer: Self-pay | Admitting: Licensed Clinical Social Worker

## 2014-02-06 DIAGNOSIS — B20 Human immunodeficiency virus [HIV] disease: Secondary | ICD-10-CM

## 2014-02-06 MED ORDER — DARUNAVIR ETHANOLATE 800 MG PO TABS: 800.0000 mg | ORAL_TABLET | Freq: Every day | ORAL | Status: DC

## 2014-02-06 MED ORDER — CITALOPRAM HYDROBROMIDE 40 MG PO TABS: 40.0000 mg | ORAL_TABLET | Freq: Every day | ORAL | Status: DC

## 2014-02-06 MED ORDER — RITONAVIR 100 MG PO TABS: 100.0000 mg | ORAL_TABLET | Freq: Every day | ORAL | Status: DC

## 2014-02-06 MED ORDER — EMTRICITABINE-TENOFOVIR DF 200-300 MG PO TABS: 1.0000 | ORAL_TABLET | Freq: Every day | ORAL | Status: DC

## 2014-04-05 ENCOUNTER — Other Ambulatory Visit: Payer: Self-pay | Admitting: Infectious Disease

## 2014-05-10 ENCOUNTER — Other Ambulatory Visit: Payer: Self-pay | Admitting: Infectious Disease

## 2016-01-22 ENCOUNTER — Encounter (HOSPITAL_COMMUNITY): Payer: Self-pay | Admitting: Emergency Medicine

## 2016-01-22 ENCOUNTER — Emergency Department (HOSPITAL_COMMUNITY): Payer: Worker's Compensation

## 2016-01-22 ENCOUNTER — Emergency Department (HOSPITAL_COMMUNITY)
Admission: EM | Admit: 2016-01-22 | Discharge: 2016-01-22 | Disposition: A | Discharge status: Home / Self Care | Payer: Worker's Compensation | Attending: Emergency Medicine | Admitting: Emergency Medicine

## 2016-01-22 DIAGNOSIS — S0083XA Contusion of other part of head, initial encounter: Secondary | ICD-10-CM | POA: Diagnosis not present

## 2016-01-22 DIAGNOSIS — F329 Major depressive disorder, single episode, unspecified: Secondary | ICD-10-CM | POA: Insufficient documentation

## 2016-01-22 DIAGNOSIS — Z79899 Other long term (current) drug therapy: Secondary | ICD-10-CM | POA: Insufficient documentation

## 2016-01-22 DIAGNOSIS — W228XXA Striking against or struck by other objects, initial encounter: Secondary | ICD-10-CM | POA: Diagnosis not present

## 2016-01-22 DIAGNOSIS — Y998 Other external cause status: Secondary | ICD-10-CM | POA: Insufficient documentation

## 2016-01-22 DIAGNOSIS — F17210 Nicotine dependence, cigarettes, uncomplicated: Secondary | ICD-10-CM | POA: Insufficient documentation

## 2016-01-22 DIAGNOSIS — Z862 Personal history of diseases of the blood and blood-forming organs and certain disorders involving the immune mechanism: Secondary | ICD-10-CM | POA: Diagnosis not present

## 2016-01-22 DIAGNOSIS — Z8719 Personal history of other diseases of the digestive system: Secondary | ICD-10-CM | POA: Diagnosis not present

## 2016-01-22 DIAGNOSIS — Y9389 Activity, other specified: Secondary | ICD-10-CM | POA: Insufficient documentation

## 2016-01-22 DIAGNOSIS — Y92511 Restaurant or cafe as the place of occurrence of the external cause: Secondary | ICD-10-CM | POA: Diagnosis not present

## 2016-01-22 DIAGNOSIS — F419 Anxiety disorder, unspecified: Secondary | ICD-10-CM | POA: Insufficient documentation

## 2016-01-22 DIAGNOSIS — S0990XA Unspecified injury of head, initial encounter: Secondary | ICD-10-CM | POA: Diagnosis not present
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
## 2016-01-22 MED ORDER — IBUPROFEN 800 MG PO TABS: 800.0000 mg | ORAL_TABLET | Freq: Three times a day (TID) | ORAL | Status: DC

## 2016-01-22 NOTE — ED Notes (Signed)
PA Sanders at bedside with patient and family.   
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
## 2016-01-22 NOTE — Discharge Instructions (Signed)
Your head CT was negative.  You may have a mild concussion. Take the prescribed medication as directed for headache.   Follow-up with your primary are doctor if problems occur. Return to the ED for new or worsening symptoms.   Concussion, Adult A concussion, or closed-head injury, is a brain injury caused by a direct blow to the head or by a quick and sudden movement (jolt) of the head or neck. Concussions are usually not life-threatening. Even so, the effects of a concussion can be serious. If you have had a concussion before, you are more likely to experience concussion-like symptoms after a direct blow to the head.  CAUSES  Direct blow to the head, such as from running into another player during a soccer game, being hit in a fight, or hitting your head on a hard surface.  A jolt of the head or neck that causes the brain to move back and forth inside the skull, such as in a car crash. SIGNS AND SYMPTOMS The signs of a concussion can be hard to notice. Early on, they may be missed by you, family members, and health care providers. You may look fine but act or feel differently. Symptoms are usually temporary, but they may last for days, weeks, or even longer. Some symptoms may appear right away while others may not show up for hours or days. Every head injury is different. Symptoms include:  Mild to moderate headaches that will not go away.  A feeling of pressure inside your head.  Having more trouble than usual:  Learning or remembering things you have heard.  Answering questions.  Paying attention or concentrating.  Organizing daily tasks.  Making decisions and solving problems.  Slowness in thinking, acting or reacting, speaking, or reading.  Getting lost or being easily confused.  Feeling tired all the time or lacking energy (fatigued).  Feeling drowsy.  Sleep disturbances.  Sleeping more than usual.  Sleeping less than usual.  Trouble falling asleep.  Trouble  sleeping (insomnia).  Loss of balance or feeling lightheaded or dizzy.  Nausea or vomiting.  Numbness or tingling.  Increased sensitivity to:  Sounds.  Lights.  Distractions.  Vision problems or eyes that tire easily.  Diminished sense of taste or smell.  Ringing in the ears.  Mood changes such as feeling sad or anxious.  Becoming easily irritated or angry for little or no reason.  Lack of motivation.  Seeing or hearing things other people do not see or hear (hallucinations). DIAGNOSIS Your health care provider can usually diagnose a concussion based on a description of your injury and symptoms. He or she will ask whether you passed out (lost consciousness) and whether you are having trouble remembering events that happened right before and during your injury. Your evaluation might include:  A brain scan to look for signs of injury to the brain. Even if the test shows no injury, you may still have a concussion.  Blood tests to be sure other problems are not present. TREATMENT  Concussions are usually treated in an emergency department, in urgent care, or at a clinic. You may need to stay in the hospital overnight for further treatment.  Tell your health care provider if you are taking any medicines, including prescription medicines, over-the-counter medicines, and natural remedies. Some medicines, such as blood thinners (anticoagulants) and aspirin, may increase the chance of complications. Also tell your health care provider whether you have had alcohol or are taking illegal drugs. This information may affect treatment.  Your health care provider will send you home with important instructions to follow.  How fast you will recover from a concussion depends on many factors. These factors include how severe your concussion is, what part of your brain was injured, your age, and how healthy you were before the concussion.  Most people with mild injuries recover fully.  Recovery can take time. In general, recovery is slower in older persons. Also, persons who have had a concussion in the past or have other medical problems may find that it takes longer to recover from their current injury. HOME CARE INSTRUCTIONS General Instructions  Carefully follow the directions your health care provider gave you.  Only take over-the-counter or prescription medicines for pain, discomfort, or fever as directed by your health care provider.  Take only those medicines that your health care provider has approved.  Do not drink alcohol until your health care provider says you are well enough to do so. Alcohol and certain other drugs may slow your recovery and can put you at risk of further injury.  If it is harder than usual to remember things, write them down.  If you are easily distracted, try to do one thing at a time. For example, do not try to watch TV while fixing dinner.  Talk with family members or close friends when making important decisions.  Keep all follow-up appointments. Repeated evaluation of your symptoms is recommended for your recovery.  Watch your symptoms and tell others to do the same. Complications sometimes occur after a concussion. Older adults with a brain injury may have a higher risk of serious complications, such as a blood clot on the brain.  Tell your teachers, school nurse, school counselor, coach, athletic trainer, or work Production designer, theatre/television/filmmanager about your injury, symptoms, and restrictions. Tell them about what you can or cannot do. They should watch for:  Increased problems with attention or concentration.  Increased difficulty remembering or learning new information.  Increased time needed to complete tasks or assignments.  Increased irritability or decreased ability to cope with stress.  Increased symptoms.  Rest. Rest helps the brain to heal. Make sure you:  Get plenty of sleep at night. Avoid staying up late at night.  Keep the same  bedtime hours on weekends and weekdays.  Rest during the day. Take daytime naps or rest breaks when you feel tired.  Limit activities that require a lot of thought or concentration. These include:  Doing homework or job-related work.  Watching TV.  Working on the computer.  Avoid any situation where there is potential for another head injury (football, hockey, soccer, basketball, martial arts, downhill snow sports and horseback riding). Your condition will get worse every time you experience a concussion. You should avoid these activities until you are evaluated by the appropriate follow-up health care providers. Returning To Your Regular Activities You will need to return to your normal activities slowly, not all at once. You must give your body and brain enough time for recovery.  Do not return to sports or other athletic activities until your health care provider tells you it is safe to do so.  Ask your health care provider when you can drive, ride a bicycle, or operate heavy machinery. Your ability to react may be slower after a brain injury. Never do these activities if you are dizzy.  Ask your health care provider about when you can return to work or school. Preventing Another Concussion It is very important to avoid another brain injury,  especially before you have recovered. In rare cases, another injury can lead to permanent brain damage, brain swelling, or death. The risk of this is greatest during the first 7-10 days after a head injury. Avoid injuries by:  Wearing a seat belt when riding in a car.  Drinking alcohol only in moderation.  Wearing a helmet when biking, skiing, skateboarding, skating, or doing similar activities.  Avoiding activities that could lead to a second concussion, such as contact or recreational sports, until your health care provider says it is okay.  Taking safety measures in your home.  Remove clutter and tripping hazards from floors and  stairways.  Use grab bars in bathrooms and handrails by stairs.  Place non-slip mats on floors and in bathtubs.  Improve lighting in dim areas. SEEK MEDICAL CARE IF:  You have increased problems paying attention or concentrating.  You have increased difficulty remembering or learning new information.  You need more time to complete tasks or assignments than before.  You have increased irritability or decreased ability to cope with stress.  You have more symptoms than before. Seek medical care if you have any of the following symptoms for more than 2 weeks after your injury:  Lasting (chronic) headaches.  Dizziness or balance problems.  Nausea.  Vision problems.  Increased sensitivity to noise or light.  Depression or mood swings.  Anxiety or irritability.  Memory problems.  Difficulty concentrating or paying attention.  Sleep problems.  Feeling tired all the time. SEEK IMMEDIATE MEDICAL CARE IF:  You have severe or worsening headaches. These may be a sign of a blood clot in the brain.  You have weakness (even if only in one hand, leg, or part of the face).  You have numbness.  You have decreased coordination.  You vomit repeatedly.  You have increased sleepiness.  One pupil is larger than the other.  You have convulsions.  You have slurred speech.  You have increased confusion. This may be a sign of a blood clot in the brain.  You have increased restlessness, agitation, or irritability.  You are unable to recognize people or places.  You have neck pain.  It is difficult to wake you up.  You have unusual behavior changes.  You lose consciousness. MAKE SURE YOU:  Understand these instructions.  Will watch your condition.  Will get help right away if you are not doing well or get worse.   This information is not intended to replace advice given to you by your health care provider. Make sure you discuss any questions you have with your  health care provider.   Document Released: 12/03/2003 Document Revised: 10/03/2014 Document Reviewed: 04/04/2013 Elsevier Interactive Patient Education [employer]

## 2016-03-04 ENCOUNTER — Emergency Department (HOSPITAL_COMMUNITY)
Admission: EM | Admit: 2016-03-04 | Discharge: 2016-03-04 | Disposition: A | Discharge status: Home / Self Care | Payer: Self-pay | Attending: Emergency Medicine | Admitting: Emergency Medicine

## 2016-03-04 ENCOUNTER — Emergency Department (HOSPITAL_COMMUNITY): Payer: Self-pay

## 2016-03-04 ENCOUNTER — Encounter (HOSPITAL_COMMUNITY): Payer: Self-pay | Admitting: Emergency Medicine

## 2016-03-04 DIAGNOSIS — Y929 Unspecified place or not applicable: Secondary | ICD-10-CM | POA: Insufficient documentation

## 2016-03-04 DIAGNOSIS — S8262XA Displaced fracture of lateral malleolus of left fibula, initial encounter for closed fracture: Secondary | ICD-10-CM

## 2016-03-04 DIAGNOSIS — Y9339 Activity, other involving climbing, rappelling and jumping off: Secondary | ICD-10-CM | POA: Insufficient documentation

## 2016-03-04 DIAGNOSIS — W1839XA Other fall on same level, initial encounter: Secondary | ICD-10-CM | POA: Insufficient documentation

## 2016-03-04 DIAGNOSIS — S8261XA Displaced fracture of lateral malleolus of right fibula, initial encounter for closed fracture: Secondary | ICD-10-CM | POA: Insufficient documentation

## 2016-03-04 DIAGNOSIS — Z79899 Other long term (current) drug therapy: Secondary | ICD-10-CM | POA: Insufficient documentation

## 2016-03-04 DIAGNOSIS — F17200 Nicotine dependence, unspecified, uncomplicated: Secondary | ICD-10-CM | POA: Insufficient documentation

## 2016-03-04 DIAGNOSIS — Y990 Civilian activity done for income or pay: Secondary | ICD-10-CM | POA: Insufficient documentation

## 2016-03-04 IMAGING — CR DG ANKLE COMPLETE 3+V*R*
3 series · 3 of 3 positions shown · non-contrast
Comparison: None.

CLINICAL DATA: Pain following twisting injury

EXAM:
RIGHT ANKLE - COMPLETE 3+ VIEW

[x ankle ap right]
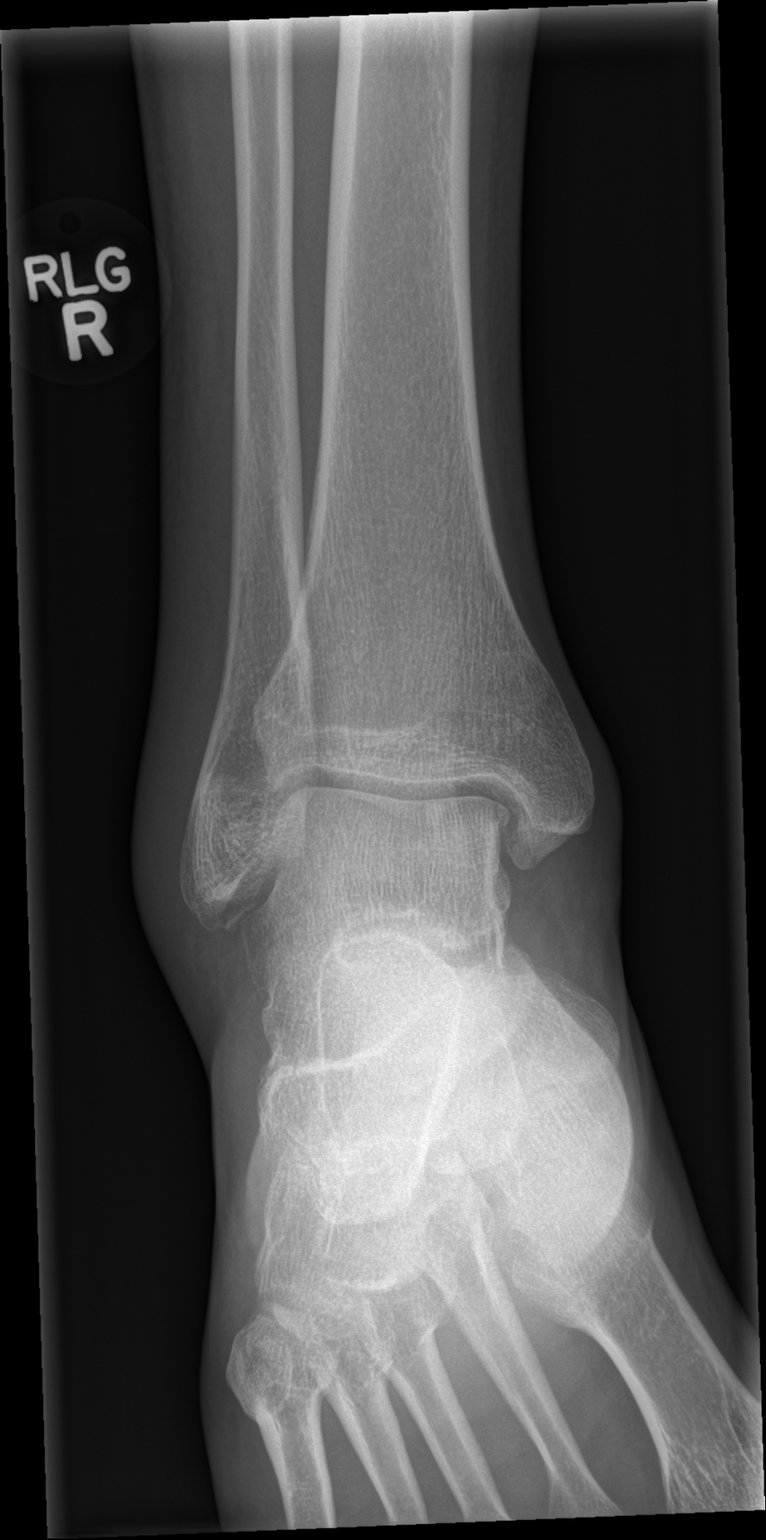

[x ankle obl right]
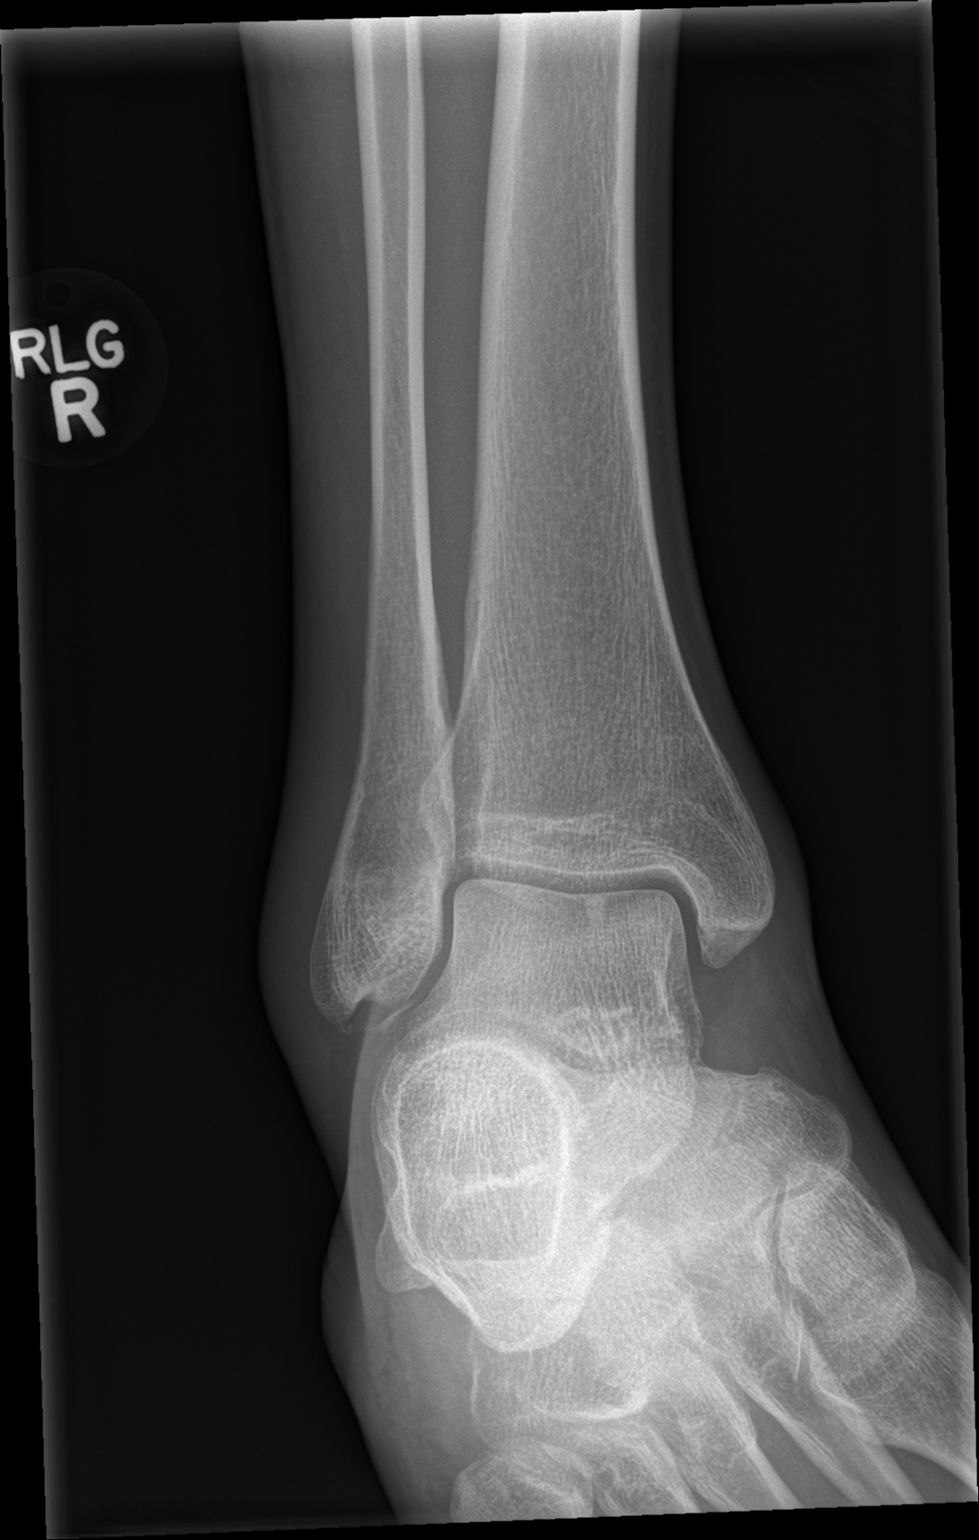

[x ankle lat right]
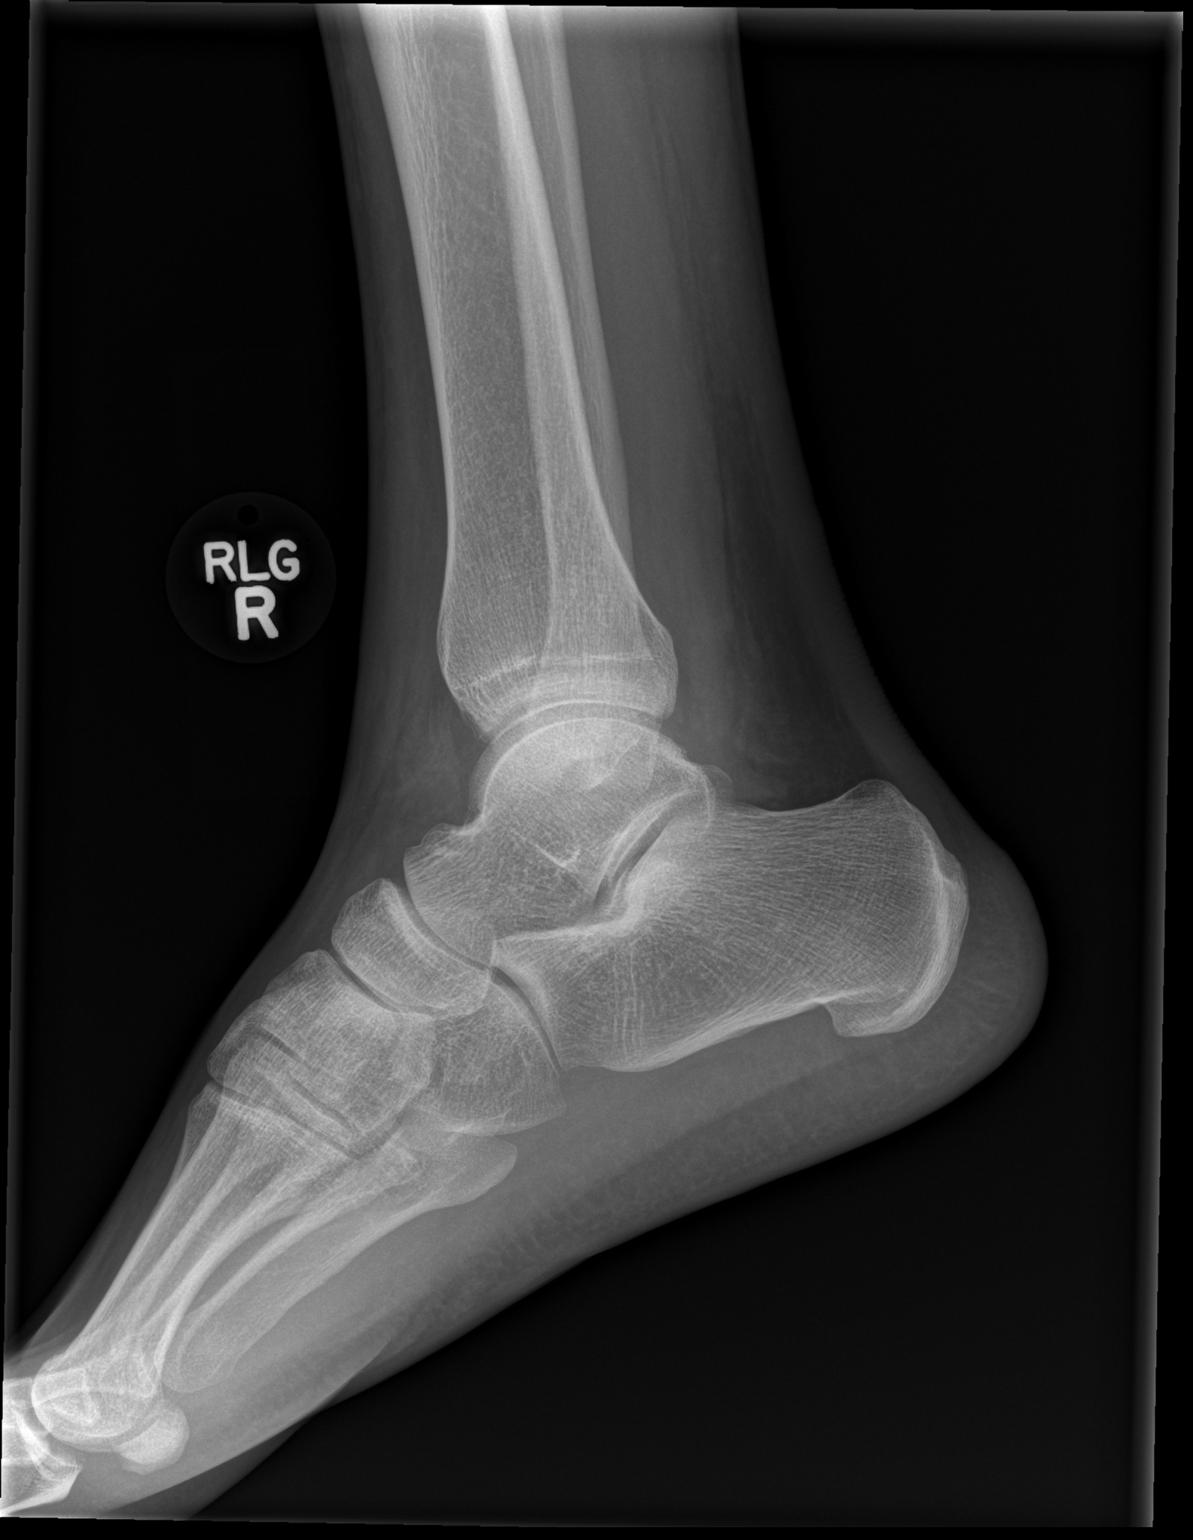

[3 of 3 positions shown; findings below may reference images not displayed]

FINDINGS: Frontal, oblique, and lateral views were obtained. There is soft
tissue swelling laterally. There is an avulsion arising from the
lateral malleolus. No other fracture is evident. There is a small
joint effusion. The ankle mortise appears intact. There is no
appreciable joint space narrowing.
IMPRESSION: Avulsion arising from the inferior aspect of the lateral malleolus
with soft tissue swelling laterally and small joint effusion. No
other fracture. Ankle mortise appears intact.

## 2016-03-04 MED ORDER — IBUPROFEN 800 MG PO TABS
800.0000 mg | ORAL_TABLET | Freq: Once | ORAL | Status: AC
  Administered 2016-03-04: 800 mg via ORAL
  Filled 2016-03-04: qty 1

## 2016-03-04 MED ORDER — HYDROCODONE-ACETAMINOPHEN 5-325 MG PO TABS: 1.0000 | ORAL_TABLET | Freq: Four times a day (QID) | ORAL | Status: DC | PRN

## 2016-03-04 NOTE — ED Provider Notes (Signed)
CSN: [id number]     Arrival date & time 03/04/16  0709 History   First MD Initiated Contact with Patient 03/04/16 [phone]     Chief Complaint  Patient presents with  . Ankle Pain     (Consider location/radiation/quality/duration/timing/severity/associated sxs/prior Treatment) HPI Comments: Patient is a 35 year old male with history of HIV disease. He presents for evaluation of right ankle pain. This started at approximately 1 AM after he reports jumping off "a stoop". He attempted to go to work, however was unable to stand on his feet and presents for evaluation of this. His pain is worse with ambulation and bearing weight and relieved with rest.  Patient is a 35 y.o. male presenting with ankle pain. The history is provided by the patient.  Ankle Pain Location:  Ankle Time since incident:  6 hours Injury: yes   Ankle location:  R ankle Pain details:    Quality:  Shooting   Radiates to:  Does not radiate   Severity:  Severe   Onset quality:  Sudden   Timing:  Constant   Progression:  Unchanged Chronicity:  New Relieved by:  Nothing Worsened by:  Activity and bearing weight   Past Medical History  Diagnosis Date  . Depression   . HIV infection (HCC)   . Substance abuse   . GERD (gastroesophageal reflux disease)   . Anxiety   . Anemia    Past Surgical History  Procedure Laterality Date  . Tooth extraction     No family history on file. Social History  Substance Use Topics  . Smoking status: Current Every Day Smoker -- 0.40 packs/day for 17 years  . Smokeless tobacco: Never Used  . Alcohol Use: No     Comment: occassionally    Review of Systems  All other systems reviewed and are negative.     Allergies  Review of patient's allergies indicates no known allergies.  Home Medications   Prior to Admission medications   Medication Sig Start Date End Date Taking? Authorizing Provider  citalopram (CELEXA) 40 MG tablet Take 1 tablet (40 mg total) by mouth daily.  02/06/14   Randall Hissornelius N Van Dam, MD  cyclobenzaprine (FLEXERIL) 10 MG tablet Take 1 tablet (10 mg total) by mouth 3 (three) times daily as needed for muscle spasms. 09/03/13   Randall Hissornelius N Van Dam, MD  Darunavir Ethanolate (PREZISTA) 800 MG tablet Take 1 tablet (800 mg total) by mouth daily. 02/06/14   Randall Hissornelius N Van Dam, MD  ibuprofen (ADVIL,MOTRIN) 800 MG tablet Take 1 tablet (800 mg total) by mouth 3 (three) times daily. 01/22/16   Garlon HatchetLisa M Sanders, PA-C  ritonavir (NORVIR) 100 MG TABS tablet Take 1 tablet (100 mg total) by mouth daily. 02/06/14   Randall Hissornelius N Van Dam, MD  TRUVADA 200-300 MG per tablet TAKE 1 TABLET BY MOUTH DAILY 05/12/14   Randall Hissornelius N Van Dam, MD   BP 142/81 mmHg  Pulse 86  Temp(Src) 97.9 F (36.6 C) (Oral)  Resp 18  SpO2 100% Physical Exam  Constitutional: He is oriented to person, place, and time. He appears well-developed and well-nourished. No distress.  HENT:  Head: Normocephalic and atraumatic.  Neck: Normal range of motion. Neck supple.  Musculoskeletal: Normal range of motion.  The right ankle has swelling and tenderness over and inferior to the lateral malleolus. There is no tenderness or swelling over the foot. Distal PMS is intact.  Neurological: He is alert and oriented to person, place, and time.  Skin: Skin is  warm and dry. He is not diaphoretic.  Nursing note and vitals reviewed.   ED Course  Procedures (including critical care time) Labs Review Labs Reviewed - No data to display  Imaging Review No results found. I have personally reviewed and evaluated these images and lab results as part of my medical decision-making.    MDM   Final diagnoses:  None    X-rays reveal an avulsion fracture of the distal fibula. He will be placed in a standard age, given crutches, pain medication, and advised to follow-up if not improving in the next 1-2 weeks.    Geoffery Lyons, MD 03/04/16 [phone]

## 2016-03-04 NOTE — ED Notes (Addendum)
Pt states he jumped off the stoop around 1 am this morning. Approximate fall height was 2', point of impact was lateral aspect of foot. Patient felt immediate pain but went to work at 5am and now presents to ED this morning complaining of intense pain.

## 2016-03-04 NOTE — ED Notes (Signed)
Ortho tech called to bedside

## 2016-03-04 NOTE — Discharge Instructions (Signed)
Hydrocodone as prescribed as needed for pain.  Wear Ace bandage as applied for the next several days.  Keep your leg elevated and ice for 20 minutes every 2 hours while awake for the next 2 days.  Follow-up with your primary Dr. if not improving in the next 1-2 weeks.   Ankle Fracture A fracture is a break in a bone. The ankle joint is made up of three bones. These include the lower (distal)sections of your lower leg bones, called the tibia and fibula, along with a bone in your foot, called the talus. Depending on how bad the break is and if more than one ankle joint bone is broken, a cast or splint is used to protect and keep your injured bone from moving while it heals. Sometimes, surgery is required to help the fracture heal properly.  There are two general types of fractures:  Stable fracture. This includes a single fracture line through one bone, with no injury to ankle ligaments. A fracture of the talus that does not have any displacement (movement of the bone on either side of the fracture line) is also stable.  Unstable fracture. This includes more than one fracture line through one or more bones in the ankle joint. It also includes fractures that have displacement of the bone on either side of the fracture line. CAUSES  A direct blow to the ankle.   Quickly and severely twisting your ankle.  Trauma, such as a car accident or falling from a significant height. RISK FACTORS You may be at a higher risk of ankle fracture if:  You have certain medical conditions.  You are involved in high-impact sports.  You are involved in a high-impact car accident. SIGNS AND SYMPTOMS   Tender and swollen ankle.  Bruising around the injured ankle.  Pain on movement of the ankle.  Difficulty walking or putting weight on the ankle.  A cold foot below the site of the ankle injury. This can occur if the blood vessels passing through your injured ankle were also damaged.  Numbness in the  foot below the site of the ankle injury. DIAGNOSIS  An ankle fracture is usually diagnosed with a physical exam and X-rays. A CT scan may also be required for complex fractures. TREATMENT  Stable fractures are treated with a cast or splint and using crutches to avoid putting weight on your injured ankle. This is followed by an ankle strengthening program. Some patients require a special type of cast, depending on other medical problems they may have. Unstable fractures require surgery to ensure the bones heal properly. Your health care provider will tell you what type of fracture you have and the best treatment for your condition. HOME CARE INSTRUCTIONS   Review correct crutch use with your health care provider and use your crutches as directed. Safe use of crutches is extremely important. Misuse of crutches can cause you to fall or cause injury to nerves in your hands or armpits.  Do not put weight or pressure on the injured ankle until directed by your health care provider.  To lessen the swelling, keep the injured leg elevated while sitting or lying down.  Apply ice to the injured area:  Put ice in a plastic bag.  Place a towel between your cast and the bag.  Leave the ice on for 20 minutes, 2-3 times a day.  If you have a plaster or fiberglass cast:  Do not try to scratch the skin under the cast with any  objects. This can increase your risk of skin infection.  Check the skin around the cast every day. You may put lotion on any red or sore areas.  Keep your cast dry and clean.  If you have a plaster splint:  Wear the splint as directed.  You may loosen the elastic around the splint if your toes become numb, tingle, or turn cold or blue.  Do not put pressure on any part of your cast or splint; it may break. Rest your cast only on a pillow the first 24 hours until it is fully hardened.  Your cast or splint can be protected during bathing with a plastic bag sealed to your skin  with medical tape. Do not lower the cast or splint into water.  Take medicines as directed by your health care provider. Only take over-the-counter or prescription medicines for pain, discomfort, or fever as directed by your health care provider.  Do not drive a vehicle until your health care provider specifically tells you it is safe to do so.  If your health care provider has given you a follow-up appointment, it is very important to keep that appointment. Not keeping the appointment could result in a chronic or permanent injury, pain, and disability. If you have any problem keeping the appointment, call the facility for assistance. SEEK MEDICAL CARE IF: You develop increased swelling or discomfort. SEEK IMMEDIATE MEDICAL CARE IF:   Your cast gets damaged or breaks.  You have continued severe pain.  You develop new pain or swelling after the cast was put on.  Your skin or toenails below the injury turn blue or gray.  Your skin or toenails below the injury feel cold, numb, or have loss of sensitivity to touch.  There is a bad smell or pus draining from under the cast. MAKE SURE YOU:   Understand these instructions.  Will watch your condition.  Will get help right away if you are not doing well or get worse.   This information is not intended to replace advice given to you by your health care provider. Make sure you discuss any questions you have with your health care provider.   Document Released: 09/09/2000 Document Revised: 09/17/2013 Document Reviewed: 04/11/2013 Elsevier Interactive Patient Education [employer]2016 Elsevier Inc.

## 2016-03-04 NOTE — ED Notes (Signed)
Pt complaint of right ankle pain post jumping off stool.

## 2016-03-15 ENCOUNTER — Emergency Department (EMERGENCY_DEPARTMENT_HOSPITAL)
Admit: 2016-03-15 | Discharge: 2016-03-15 | Disposition: A | Discharge status: Home / Self Care | Payer: Self-pay | Attending: Emergency Medicine | Admitting: Emergency Medicine

## 2016-03-15 ENCOUNTER — Emergency Department (HOSPITAL_COMMUNITY): Payer: Self-pay

## 2016-03-15 ENCOUNTER — Encounter (HOSPITAL_COMMUNITY): Payer: Self-pay

## 2016-03-15 ENCOUNTER — Emergency Department (HOSPITAL_COMMUNITY)
Admission: EM | Admit: 2016-03-15 | Discharge: 2016-03-15 | Disposition: A | Discharge status: Home / Self Care | Payer: Self-pay | Attending: Emergency Medicine | Admitting: Emergency Medicine

## 2016-03-15 DIAGNOSIS — Z21 Asymptomatic human immunodeficiency virus [HIV] infection status: Secondary | ICD-10-CM | POA: Insufficient documentation

## 2016-03-15 DIAGNOSIS — F17200 Nicotine dependence, unspecified, uncomplicated: Secondary | ICD-10-CM | POA: Insufficient documentation

## 2016-03-15 DIAGNOSIS — M7989 Other specified soft tissue disorders: Secondary | ICD-10-CM

## 2016-03-15 DIAGNOSIS — F1290 Cannabis use, unspecified, uncomplicated: Secondary | ICD-10-CM | POA: Insufficient documentation

## 2016-03-15 DIAGNOSIS — Z79899 Other long term (current) drug therapy: Secondary | ICD-10-CM | POA: Insufficient documentation

## 2016-03-15 DIAGNOSIS — L03115 Cellulitis of right lower limb: Secondary | ICD-10-CM | POA: Insufficient documentation

## 2016-03-15 DIAGNOSIS — F329 Major depressive disorder, single episode, unspecified: Secondary | ICD-10-CM | POA: Insufficient documentation

## 2016-03-15 LAB — CBC WITH DIFFERENTIAL/PLATELET
BASOS ABS: 0 10*3/uL (ref 0.0–0.1)
BASOS PCT: 0 %
Eosinophils Absolute: 0.1 10*3/uL (ref 0.0–0.7)
Eosinophils Relative: 1 %
HEMATOCRIT: 40.9 % (ref 39.0–52.0)
HEMOGLOBIN: 14 g/dL (ref 13.0–17.0)
LYMPHS PCT: 20 %
Lymphs Abs: 1.3 10*3/uL (ref 0.7–4.0)
MCH: 30.7 pg (ref 26.0–34.0)
MCHC: 34.2 g/dL (ref 30.0–36.0)
MCV: 89.7 fL (ref 78.0–100.0)
Monocytes Absolute: 0.5 10*3/uL (ref 0.1–1.0)
Monocytes Relative: 9 %
NEUTROS ABS: 4.5 10*3/uL (ref 1.7–7.7)
NEUTROS PCT: 70 %
Platelets: 331 10*3/uL (ref 150–400)
RBC: 4.56 MIL/uL (ref 4.22–5.81)
RDW: 13.9 % (ref 11.5–15.5)
WBC: 6.4 10*3/uL (ref 4.0–10.5)

## 2016-03-15 LAB — BASIC METABOLIC PANEL
ANION GAP: 7 (ref 5–15)
BUN: 8 mg/dL (ref 6–20)
CALCIUM: 8.9 mg/dL (ref 8.9–10.3)
CHLORIDE: 99 mmol/L — AB (ref 101–111)
CO2: 26 mmol/L (ref 22–32)
Creatinine, Ser: 1.08 mg/dL (ref 0.61–1.24)
GFR calc non Af Amer: >60 mL/min (ref >60)
GLUCOSE: 107 mg/dL — AB (ref 65–99)
POTASSIUM: 4.1 mmol/L (ref 3.5–5.1)
Sodium: 132 mmol/L — ABNORMAL LOW (ref 135–145)

## 2016-03-15 LAB — APTT: APTT: 42 s — AB (ref 24–37)

## 2016-03-15 LAB — PROTIME-INR
INR: 1.08 (ref 0.00–1.49)
PROTHROMBIN TIME: 14.2 s (ref 11.6–15.2)

## 2016-03-15 LAB — I-STAT CG4 LACTIC ACID, ED: Lactic Acid, Venous: 1.73 mmol/L (ref 0.5–2.0)

## 2016-03-15 IMAGING — DX DG ANKLE COMPLETE 3+V*R*
3 series · 3 of 3 positions shown · non-contrast
Comparison: [DATE].

CLINICAL DATA: 34-year-old male with recent avulsion fracture of
the right ankle, untreated. Recurrent pain and swelling. Subsequent
encounter.

EXAM:
RIGHT ANKLE - COMPLETE 3+ VIEW

[ankle ap]
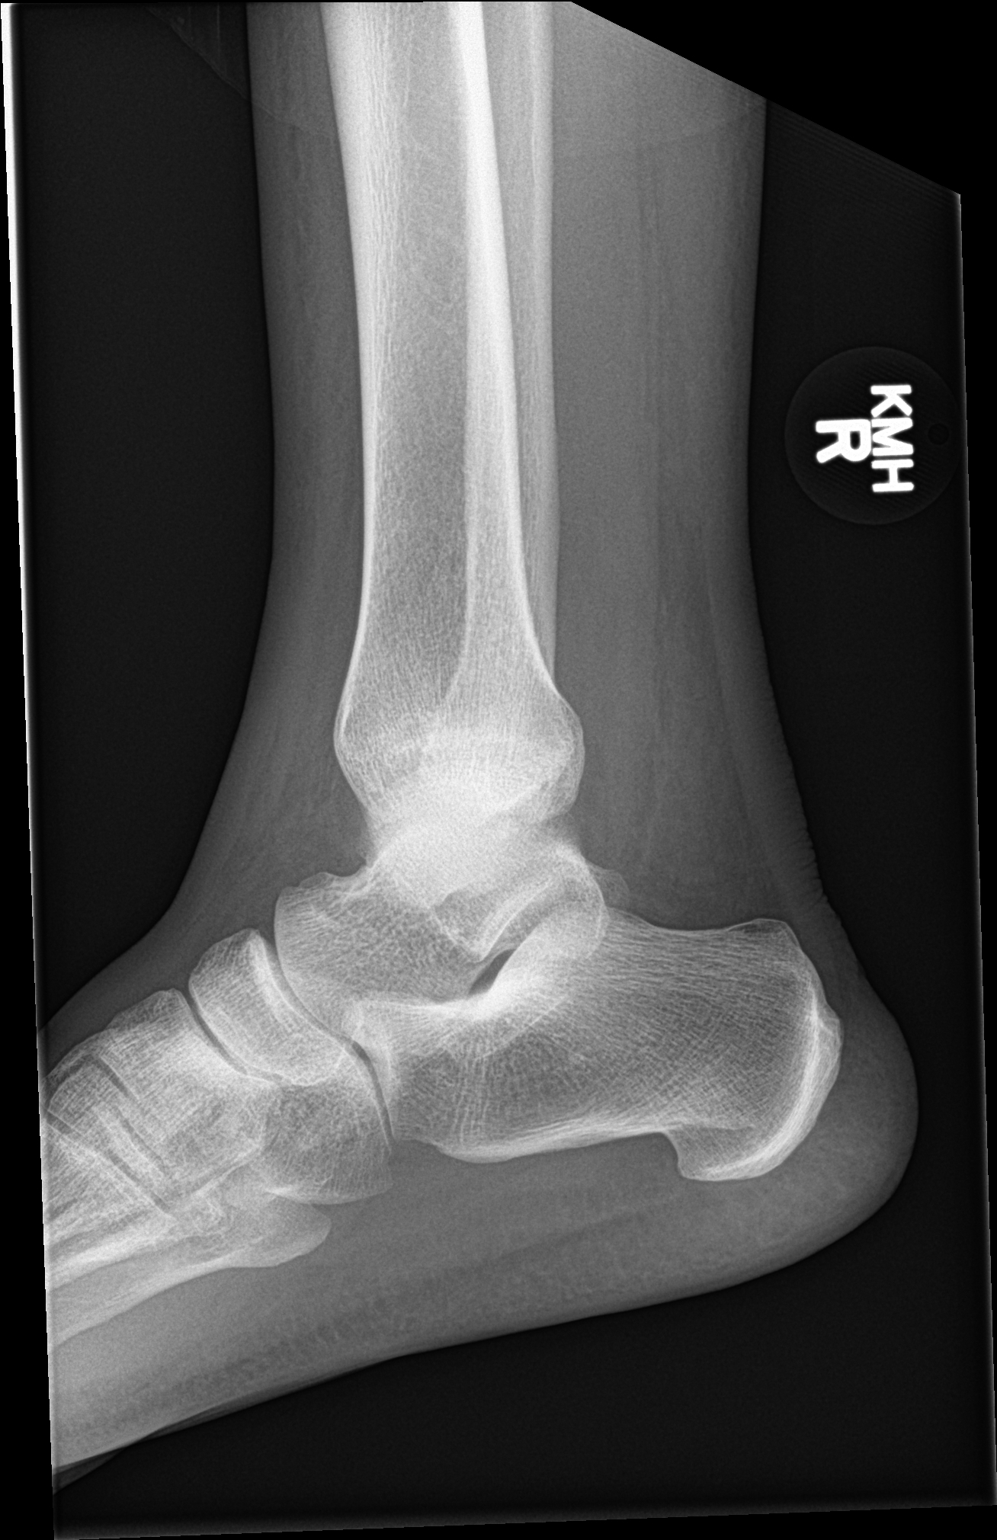

[ankle lat]
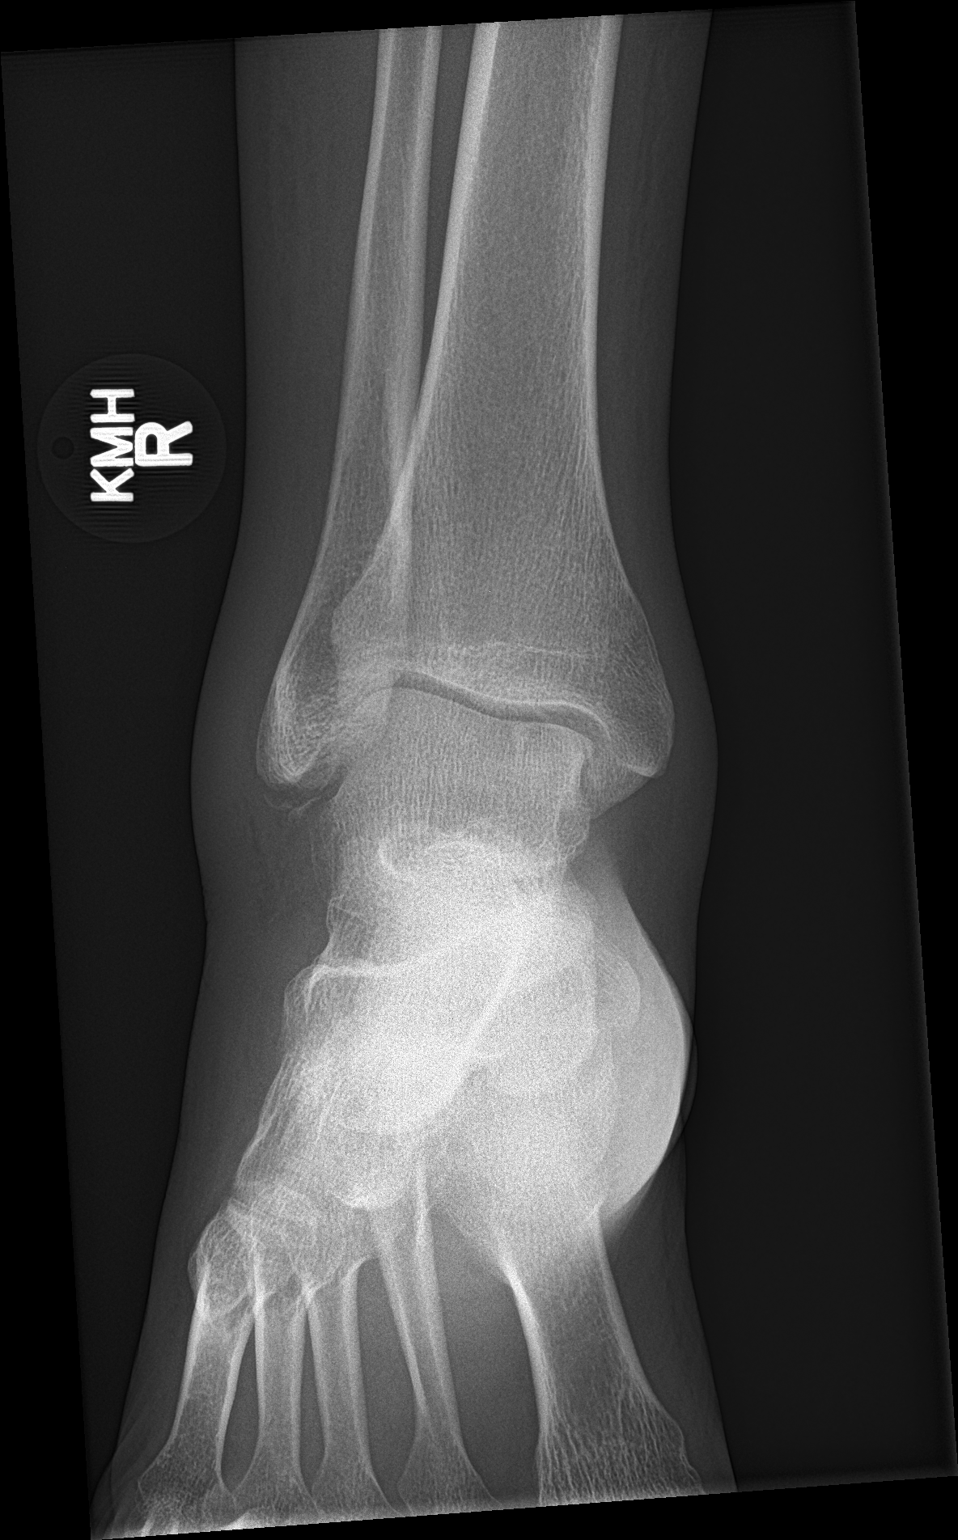

[ankle obl]
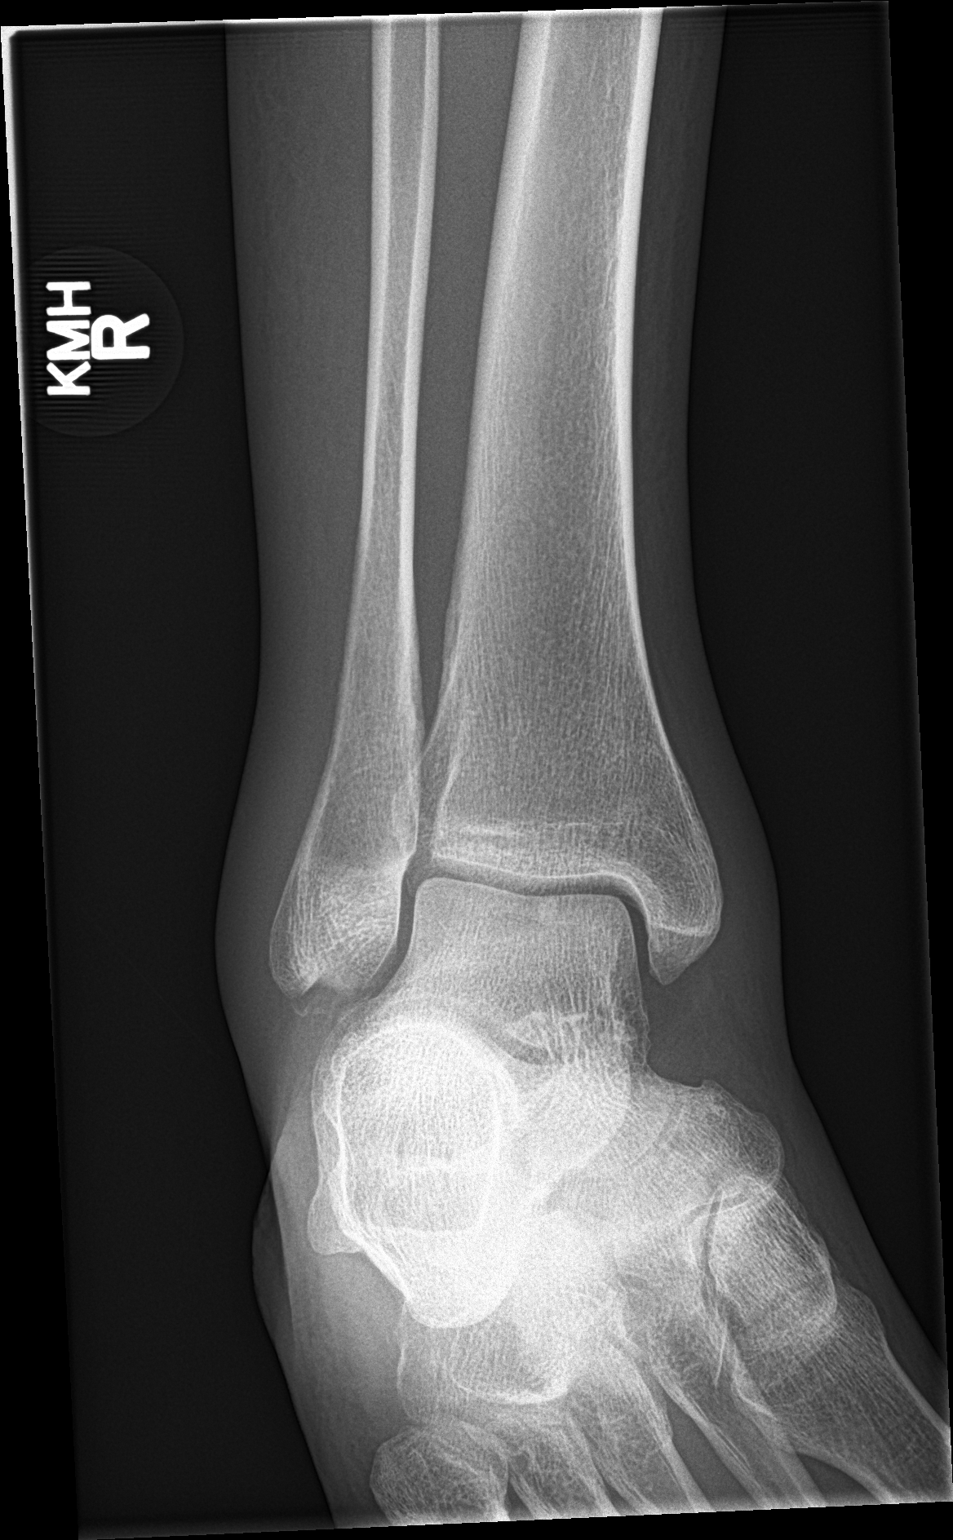

[3 of 3 positions shown; findings below may reference images not displayed]

FINDINGS: Small avulsion fragments at the tip of the lateral malleolus are re-
demonstrated. Stable mortise joint alignment. Talar dome and distal
tibia remain intact. Calcaneus intact. Increased generalized soft
tissue swelling about the ankle. No new osseous abnormality.
IMPRESSION: Avulsion fracture fragments at the tip of the lateral malleolus re-
demonstrated. Increased generalized soft tissue swelling since
[DATE]. No new osseous abnormality.

## 2016-03-15 MED ORDER — CEPHALEXIN 500 MG PO CAPS
500.0000 mg | ORAL_CAPSULE | Freq: Once | ORAL | Status: AC
  Administered 2016-03-15: 500 mg via ORAL
  Filled 2016-03-15: qty 1

## 2016-03-15 MED ORDER — MORPHINE SULFATE (PF) 4 MG/ML IV SOLN
4.0000 mg | Freq: Once | INTRAVENOUS | Status: AC
  Administered 2016-03-15: 4 mg via INTRAVENOUS
  Filled 2016-03-15: qty 1

## 2016-03-15 MED ORDER — ACETAMINOPHEN 325 MG PO TABS
650.0000 mg | ORAL_TABLET | Freq: Once | ORAL | Status: AC
  Administered 2016-03-15: 650 mg via ORAL
  Filled 2016-03-15: qty 2

## 2016-03-15 MED ORDER — CEPHALEXIN 500 MG PO CAPS: 500.0000 mg | ORAL_CAPSULE | Freq: Four times a day (QID) | ORAL | Status: DC

## 2016-03-15 MED ORDER — SODIUM CHLORIDE 0.9 % IV BOLUS (SEPSIS)
1000.0000 mL | Freq: Once | INTRAVENOUS | Status: AC
  Administered 2016-03-15: 1000 mL via INTRAVENOUS

## 2016-03-15 MED ORDER — ONDANSETRON HCL 4 MG/2ML IJ SOLN
4.0000 mg | Freq: Once | INTRAMUSCULAR | Status: AC
  Administered 2016-03-15: 4 mg via INTRAVENOUS
  Filled 2016-03-15: qty 2

## 2016-03-15 NOTE — ED Provider Notes (Signed)
  Physical Exam  BP 113/76 mmHg  Pulse 94  Temp(Src) 100.3 F (37.9 C) (Oral)  Resp 18  SpO2 100%  Physical Exam  ED Course  Procedures  MDM 1:20 PM- [facility] Ankle Fracture x 1 month Ago  Hx HIV with no medication or ID follow up Temp 100.3. VS otherwise stable Labs ordered  Cellulitis vs. DVT If DVT negative, DC with ABX for Cellulitis and follow up to Infectious Disease 2:14 PM- DVT ultrasound negative. Labs otherwise unremarkable. Given ID follow up for HIV. Given Rx Keflex. Given dose in ED.    Plan is to DC home with ABX for cellulitis. At time of discharge. Patient is in no acute distress. Vital Signs are stable. Patient is able to ambulate. Patient able to tolerate PO.          Audry Piliyler Otila Starn, PA-C 03/15/16 1621  Vanetta MuldersScott Zackowski, MD 03/16/16 [phone]

## 2016-03-15 NOTE — Discharge Instructions (Signed)
Please read and follow all provided instructions.  Your diagnoses today include:  1. Cellulitis of right lower extremity   2. Leg swelling    Tests performed today include:  Vital signs. See below for your results today.   Medications prescribed:   Take any prescribed medications only as directed.   Home care instructions:  Follow any educational materials contained in this packet. Keep affected area above the level of your heart when possible. Wash area gently twice a day with warm soapy water. Do not apply alcohol or hydrogen peroxide. Cover the area if it draining or weeping.   Follow-up instructions: Return to the Emergency Department in 48 hours for a recheck.  Please follow-up with your primary care provider in the next 1 week for further evaluation of your symptoms.   I have provided a number for Infectious Disease so you can follow up about your HIV medications.   Return instructions:  Return to the Emergency Department if you have:  Fever  Worsening symptoms  Worsening pain  Worsening swelling  Redness of the skin that moves away from the affected area, especially if it streaks away from the affected area   Any other emergent concerns  Your vital signs today were: BP 113/76 mmHg   Pulse 94   Temp(Src) 100.3 F (37.9 C) (Oral)   Resp 18   SpO2 100% If your blood pressure (BP) was elevated above 135/85 this visit, please have this repeated by your doctor within one month. --------------

## 2016-03-15 NOTE — ED Notes (Signed)
Bed: WA28 Expected date:  Expected time:  Means of arrival:  Comments: 

## 2016-03-15 NOTE — Progress Notes (Signed)
VASCULAR LAB PRELIMINARY  PRELIMINARY  PRELIMINARY  PRELIMINARY  Right lower extremity venous duplex completed.    Preliminary report:  Right:  No evidence of DVT, superficial thrombosis, or Baker's cyst.  Jordon Kristiansen, RVS 03/15/2016, 2:08 PM

## 2016-03-15 NOTE — ED Notes (Signed)
Pt was here on 6/9 for alvusion fracture. Pt told to stay out of work for a week.  Was to follow up with ortho.  Pt did not.  Pt went back to work and stands on his feet.  Past few days, pain and swelling have returned and increased

## 2016-03-15 NOTE — ED Notes (Signed)
US at bedside

## 2016-03-15 NOTE — ED Provider Notes (Signed)
CSN: [id number]     Arrival date & time 03/15/16  1105 History  By signing my name below, I, Arthur [name], attest that this documentation has been prepared under the direction and in the presence of Arthur EmeryNicole Micheal Sheen, PA-C Electronically Signed: Soijett [name], ED Scribe. 03/15/2016. 12:49 PM.   Chief Complaint  Patient presents with  . Foot Pain     The history is provided by the patient. No language interpreter was used.    Arthur [name] is a 35 y.o. male with a medical hx of HIV, substance abuse, who presents to the Emergency Department complaining of right foot pain onset 11 days. Pt states that he fractured his ankle on 03/04/2016 and he has had progressive swelling x 2-3 days. Pt denies seeing the orthopedist that he was referred too. Pt reports that he has been elevating his right ankle, but he has not used his crutches due to where he works at and he stopped using the crutches 1 week following the initial visit. Denies cutting his right ankle on anything and he states that there is warmth to the area. Pt is having associated symptoms of right foot swelling. He notes that he has tried Rx norco for the relief of his symptoms. He denies any other symptoms. Denies having a PCP. Denies allergies to medications.  Pt states that [place] and [place] back to [place] and he denies being able to re-establish care with infectious disease. Pt states that he is unaware of his last CD4 count or viral load and he has been non-compliant.    Per pt chart review: Pt was seen in the ED on 03/04/2016 for right ankle pain. Pt had right ankle xray imaging completed with avulsion fracture of the distal fibular results. Pt was informed to follow up with orthopedist if pain persists in the next 1-2 weeks. Pt was Rx norco for their symptoms.   Past Medical History  Diagnosis Date  . Depression   . HIV infection (HCC)   . Substance abuse   . GERD (gastroesophageal reflux disease)   .  Anxiety   . Anemia    Past Surgical History  Procedure Laterality Date  . Tooth extraction     History reviewed. No pertinent family history. Social History  Substance Use Topics  . Smoking status: Current Every Day Smoker -- 0.40 packs/day for 17 years  . Smokeless tobacco: Never Used  . Alcohol Use: No     Comment: occassionally    Review of Systems  Musculoskeletal: Positive for joint swelling (right ankle) and arthralgias (right ankle).  Skin: Positive for color change (redness to right ankle ).      Allergies  Review of patient's allergies indicates no known allergies.  Home Medications   Prior to Admission medications   Medication Sig Start Date End Date Taking? Authorizing Provider  citalopram (CELEXA) 40 MG tablet Take 1 tablet (40 mg total) by mouth daily. 02/06/14   Randall Hissornelius N Van Dam, MD  cyclobenzaprine (FLEXERIL) 10 MG tablet Take 1 tablet (10 mg total) by mouth 3 (three) times daily as needed for muscle spasms. 09/03/13   Randall Hissornelius N Van Dam, MD  Darunavir Ethanolate (PREZISTA) 800 MG tablet Take 1 tablet (800 mg total) by mouth daily. 02/06/14   Randall Hissornelius N Van Dam, MD  HYDROcodone-acetaminophen [facility](NORCO) 5-325 MG tablet Take 1-2 tablets by mouth every 6 (six) hours as needed. 03/04/16   Geoffery Lyonsouglas Delo, MD  ibuprofen (ADVIL,MOTRIN) 800 MG tablet  Take 1 tablet (800 mg total) by mouth 3 (three) times daily. 01/22/16   Garlon Hatchet, PA-C  ritonavir (NORVIR) 100 MG TABS tablet Take 1 tablet (100 mg total) by mouth daily. 02/06/14   Randall Hiss, MD  TRUVADA 200-300 MG per tablet TAKE 1 TABLET BY MOUTH DAILY 05/12/14   Randall Hiss, MD   BP 113/76 mmHg  Pulse 94  Temp(Src) 100.3 F (37.9 C) (Oral)  Resp 18  SpO2 100% Physical Exam  Constitutional: He is oriented to person, place, and time. He appears well-developed and well-nourished. No distress.  HENT:  Head: Normocephalic and atraumatic.  Eyes: EOM are normal.  Neck: Neck supple.  Cardiovascular:  Normal rate.   Pulmonary/Chest: Effort normal. No respiratory distress.  Abdominal: He exhibits no distension.  Musculoskeletal: Normal range of motion.       Right ankle: He exhibits swelling. Tenderness.  Right ankle to mid shin excluding toes with 2+ pitting edema with exquisitely TTP and atrophic skin with warmth, no focal fluctuance, no signs of puncture wounds or abrasions. No TTP in right calf. Distally NVI.  Neurological: He is alert and oriented to person, place, and time.  Skin: Skin is warm and dry.  Psychiatric: He has a normal mood and affect. His behavior is normal.  Nursing note and vitals reviewed.   ED Course  Procedures (including critical care time) DIAGNOSTIC STUDIES: Oxygen Saturation is 100% on RA, nl by my interpretation.    COORDINATION OF CARE: 12:45 PM Discussed treatment plan with pt at bedside which includes right ankle xray, vas [place] LE venous (DVT), zofran, tylenol, labs, and pt agreed to plan.    Labs Review Labs Reviewed - No data to display  Imaging Review Dg Ankle Complete Right  03/15/2016  CLINICAL DATA:  35 year old male with recent avulsion fracture of the right ankle, untreated. Recurrent pain and swelling. Subsequent encounter. EXAM: RIGHT ANKLE - COMPLETE 3+ VIEW COMPARISON:  03/04/2016. FINDINGS: Small avulsion fragments at the tip of the lateral malleolus are re- demonstrated. Stable mortise joint alignment. Talar dome and distal tibia remain intact. Calcaneus intact. Increased generalized soft tissue swelling about the ankle. No new osseous abnormality. IMPRESSION: Avulsion fracture fragments at the tip of the lateral malleolus re- demonstrated. Increased generalized soft tissue swelling since 03/04/2016. No new osseous abnormality. Electronically Signed   By: Odessa Fleming M.D.   On: 03/15/2016 12:32   I have personally reviewed and evaluated these images and lab results as part of my medical decision-making.   EKG Interpretation None      MDM    Final diagnoses:  None    Filed Vitals:   03/15/16 1123  BP: 113/76  Pulse: 94  Temp: 100.3 F (37.9 C)  TempSrc: Oral  Resp: 18  SpO2: 100%    Medications  sodium chloride 0.9 % bolus 1,000 mL (1,000 mLs Intravenous New Bag/Given 03/15/16 1318)  morphine 4 MG/ML injection 4 mg (4 mg Intravenous Given 03/15/16 1319)  ondansetron (ZOFRAN) injection 4 mg (4 mg Intravenous Given 03/15/16 1318)  acetaminophen (TYLENOL) tablet 650 mg (650 mg Oral Given 03/15/16 1319)    SAAGAR TORTORELLA is 35 y.o. male presenting with Pain and swelling to right ankle consistent with cellulitis versus DVT, patient is borderline febrile at 100.3. He has a history of HIV/AIDS, does not appear that he has been following with infectious these however he says that he's been compliant with his medications. Patient will be moved out of  fast track area to a higher level of care, case signed out to [facility] who will follow-up bloodwork and DVT study.  I personally performed the services described in this documentation, which was scribed in my presence. The recorded information has been reviewed and is accurate.    Arthur Emery, PA-C 03/15/16 1341  Vanetta Mulders, MD 03/15/16 1451

## 2016-03-16 ENCOUNTER — Telehealth: Payer: Self-pay | Admitting: *Deleted

## 2016-03-16 LAB — BLOOD CULTURE ID PANEL (REFLEXED)
Acinetobacter baumannii: NOT DETECTED
CANDIDA ALBICANS: NOT DETECTED
CANDIDA GLABRATA: NOT DETECTED
CANDIDA KRUSEI: NOT DETECTED
CANDIDA PARAPSILOSIS: NOT DETECTED
CANDIDA TROPICALIS: NOT DETECTED
Carbapenem resistance: NOT DETECTED
ENTEROBACTER CLOACAE COMPLEX: NOT DETECTED
ESCHERICHIA COLI: NOT DETECTED
Enterobacteriaceae species: NOT DETECTED
Enterococcus species: NOT DETECTED
Haemophilus influenzae: NOT DETECTED
KLEBSIELLA PNEUMONIAE: NOT DETECTED
Klebsiella oxytoca: NOT DETECTED
Listeria monocytogenes: NOT DETECTED
METHICILLIN RESISTANCE: NOT DETECTED
Neisseria meningitidis: NOT DETECTED
PROTEUS SPECIES: NOT DETECTED
Pseudomonas aeruginosa: NOT DETECTED
STREPTOCOCCUS PNEUMONIAE: NOT DETECTED
Serratia marcescens: NOT DETECTED
Staphylococcus aureus (BCID): NOT DETECTED
Staphylococcus species: DETECTED — AB
Streptococcus agalactiae: NOT DETECTED
Streptococcus pyogenes: NOT DETECTED
Streptococcus species: NOT DETECTED
Vancomycin resistance: NOT DETECTED

## 2016-03-17 NOTE — ED Notes (Unsigned)
Post ED Visit - Positive Culture Follow-up: Unsuccessful Patient Follow-up  Culture assessed and recommendations reviewed by: []  Enzo BiNathan Batchelder, Pharm.D. []  Celedonio MiyamotoJeremy Frens, [address]Ph[address]D., BCPS []  Garvin FilaMike Maccia, Pharm.D. []  Georgina PillionElizabeth Martin, Pharm.D., BCPS []  SanfordMinh Pham, [address]Pharm[address]D., BCPS, AAHIVP []  Estella HuskMichelle Turner, Pharm.D., BCPS, AAHIVP []  Tennis Mustassie Stewart, Pharm.D. []  Sherle Poeob Vincent, [address]Ph[address]  Positive *** culture  []  Patient discharged without antimicrobial prescription and treatment is now indicated []  Organism is resistant to prescribed ED discharge antimicrobial [x]  Patient with positive blood cultures   Unable to contact patient after 3 attempts, letter will be sent to address on file  Lysle PearlRobertson, Jacqulynn Shappell Talley 03/17/2016, 8:32 AM

## 2016-03-18 LAB — CULTURE, BLOOD (ROUTINE X 2)

## 2016-03-19 ENCOUNTER — Telehealth (HOSPITAL_BASED_OUTPATIENT_CLINIC_OR_DEPARTMENT_OTHER): Payer: Self-pay

## 2016-03-19 NOTE — Telephone Encounter (Signed)
Post ED Visit - Positive Culture Follow-up  Culture report reviewed by antimicrobial stewardship pharmacist:  []  Enzo BiNathan Batchelder, Pharm.D. []  Celedonio MiyamotoJeremy Frens, Pharm.D., BCPS []  Garvin FilaMike Maccia, Pharm.D. []  Georgina PillionElizabeth Martin, Pharm.D., BCPS []  WindsorMinh Pham, [place]Pharm.D., BCPS, AAHIVP []  Estella HuskMichelle Turner, Pharm.D., BCPS, AAHIVP []  Tennis Mustassie Stewart, Pharm.D. []  Sherle Poeob Vincent, [address]Ph[address] Revonda StandardAllison Masters Pharm D Positive Blood culture Treated with cephalexin, organism sensitive to the same and no further patient follow-up is required at this time.  Jerry CarasCullom, Mahika Vanvoorhis Burnett 03/19/2016, 11:02 AM

## 2016-03-20 LAB — CULTURE, BLOOD (ROUTINE X 2): CULTURE: NO GROWTH

## 2016-05-11 ENCOUNTER — Inpatient Hospital Stay: Payer: Self-pay | Admitting: Infectious Disease

## 2016-05-11 ENCOUNTER — Telehealth: Payer: Self-pay | Admitting: *Deleted

## 2016-05-11 NOTE — Telephone Encounter (Signed)
Left message requesting the pt call for a new appt with Dr. Daiva EvesVan Dam.

## 2016-05-23 ENCOUNTER — Inpatient Hospital Stay: Payer: Self-pay | Admitting: Infectious Disease

## 2016-05-30 ENCOUNTER — Telehealth (HOSPITAL_BASED_OUTPATIENT_CLINIC_OR_DEPARTMENT_OTHER): Payer: Self-pay | Admitting: *Deleted

## 2016-05-30 NOTE — Telephone Encounter (Signed)
Post ED Visit - Positive Culture Follow-up: Unsuccessful Patient Follow-up  Culture assessed and recommendations reviewed by: []  Enzo BiNathan Batchelder, Pharm.D. []  Celedonio MiyamotoJeremy Frens, Pharm.D., BCPS []  Garvin FilaMike Maccia, Pharm.D. []  Georgina PillionElizabeth Martin, Pharm.D., BCPS []  GreasyMinh Pham, [place]Pharm.D., BCPS, AAHIVP []  Estella HuskMichelle Turner, Pharm.D., BCPS, AAHIVP []  Tennis Mustassie Stewart, [address]Pharm[address] []  Sherle Poeob Vincent, [place]Pharm.D. Audry Piliyler Mohr, PA-C  Positive blood culture  []  Patient discharged without antimicrobial prescription and treatment is now indicated []  Organism is resistant to prescribed ED discharge antimicrobial [x]  Patient with positive blood cultures   Unable to contact patient after 3 attempts, letter sent to address on file with no response. No further treatment received.  Virl AxeRobertson, Istvan Behar Talley 05/30/2016, 10:02 AM

## 2016-06-22 ENCOUNTER — Telehealth: Payer: Self-pay | Admitting: *Deleted

## 2016-06-22 NOTE — Telephone Encounter (Signed)
[facility] RN reviewed the chart and has made several attempts to contact the patient without success. Numbers listed for the patient are not active at this time. RN would like to offer my services to the patient or at the let schedule him for a lab appt. On review of the chart the patient has cancelled several appts with Dr Daiva EvesVan Dam and I hope to arrange on the lab appt and appt with Dr Daiva EvesVan Dam for the same day.  Plan is to continue to try and reach the patient

## 2016-11-02 ENCOUNTER — Telehealth: Payer: Self-pay | Admitting: *Deleted

## 2016-11-02 NOTE — Telephone Encounter (Signed)
[name]  Sept 27[facility] 2017. RN was unable to reach the patient by phone at that time.   RN reviewed the chart again today and noted the patient has not been seen in the clinic for a long time now. RN made several attempts to contact the patient without success. Voicemail for the number listed has not been set up and will not acccept messages.    RN sent the patient a email stating "Hello [name]! My name is Telford NabAmbre McNeil RN. I work with Dr Daiva EvesVan Dam out in the community. I have been trying to reach you by phone without success. We just want to know how you are doing and I would love to offer you some support if you are interested. Please give me a call or text at [phone]"

## 2018-01-17 ENCOUNTER — Emergency Department (HOSPITAL_COMMUNITY)
Admission: EM | Admit: 2018-01-17 | Discharge: 2018-01-17 | Disposition: A | Discharge status: Home / Self Care | Payer: Self-pay | Attending: Emergency Medicine | Admitting: Emergency Medicine

## 2018-01-17 ENCOUNTER — Other Ambulatory Visit: Payer: Self-pay

## 2018-01-17 ENCOUNTER — Encounter (HOSPITAL_COMMUNITY): Payer: Self-pay | Admitting: Emergency Medicine

## 2018-01-17 DIAGNOSIS — B029 Zoster without complications: Secondary | ICD-10-CM | POA: Insufficient documentation

## 2018-01-17 DIAGNOSIS — Z79899 Other long term (current) drug therapy: Secondary | ICD-10-CM | POA: Insufficient documentation

## 2018-01-17 DIAGNOSIS — F17200 Nicotine dependence, unspecified, uncomplicated: Secondary | ICD-10-CM | POA: Insufficient documentation

## 2018-01-17 MED ORDER — VALACYCLOVIR HCL 1 G PO TABS: 1000.0000 mg | ORAL_TABLET | Freq: Three times a day (TID) | ORAL | 0 refills | Status: AC

## 2018-01-17 MED ORDER — PREDNISONE 20 MG PO TABS: 40.0000 mg | ORAL_TABLET | Freq: Every day | ORAL | 0 refills | Status: AC

## 2018-01-17 NOTE — ED Triage Notes (Signed)
Pt complaint of rash to right arm onset yesterday; unknown if "bit by something."

## 2018-01-17 NOTE — ED Provider Notes (Signed)
[facility]-EMERGENCY DEPT Provider Note   CSN: [id number] Arrival date & time: 01/17/18  1424     History   Chief Complaint Chief Complaint  Patient presents with  . Rash    HPI Arthur [name] is a 37 y.o. male presenting for evaluation of rash.  Patient states that yesterday he developed a rash of his right arm.  It is itchy and painful, and causes shooting pains that goes from his thumb to his shoulder blade.  He denies fevers, chills, chest pain, shortness of breath, nausea, vomiting, abdominal pain, urinary symptoms, abnormal bowel movements. No one else at home has similar rash.  He has a history of HIV for which he is not currently getting treatment, last CD4 count 4 years ago was 26.  He states he had chickenpox as a child.  He denies new contacts, exposures, environments, shampoos, conditioners, or soaps.  He denies recent travel or new foods.  He has no other medical problems but does not take medications daily.  He has not taken anything for the rash. He works at a [employer].  HPI  Past Medical History:  Diagnosis Date  . Anemia   . Anxiety   . Depression   . GERD (gastroesophageal reflux disease)   . HIV infection (HCC)   . Substance abuse [facility])     Patient Active Problem List   Diagnosis Date Noted  . Nausea and vomiting 03/25/2013  . Muscle spasm 03/25/2013  . Drug use 01/11/2012  . 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 COUGH 06/23/2009  . CHEST PAIN, PLEURITIC 06/23/2009  . FUNGAL DERMATITIS 05/28/2008  . GERD 05/28/2008  . DENTAL CARIES 02/13/2008  . NICOTINE ADDICTION 11/14/2007  . BACK STRAIN, LUMBAR 08/22/2007  . 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 WEIGHT LOSS, RECENT 05/01/2007  . ANXIETY DEPRESSION 02/14/2007  . HIV DISEASE 02/13/2007  . CERVICAL LYMPHADENOPATHY, ANTERIOR, RIGHT 02/13/2007    Past Surgical History:  Procedure Laterality Date  . TOOTH EXTRACTION          Home Medications    Prior to Admission  medications   Medication Sig Start Date End Date Taking? Authorizing Provider  cephALEXin (KEFLEX) 500 MG capsule Take 1 capsule (500 mg total) by mouth 4 (four) times daily. 03/15/16   Audry Pili, PA-C  citalopram (CELEXA) 40 MG tablet Take 1 tablet (40 mg total) by mouth daily. 02/06/14   Randall Hiss, MD  cyclobenzaprine (FLEXERIL) 10 MG tablet Take 1 tablet (10 mg total) by mouth 3 (three) times daily as needed for muscle spasms. 09/03/13   Randall Hiss, MD  Darunavir Ethanolate (PREZISTA) 800 MG tablet Take 1 tablet (800 mg total) by mouth daily. 02/06/14   Randall Hiss, MD  HYDROcodone-acetaminophen [facility]) 5-325 MG tablet Take 1-2 tablets by mouth every 6 (six) hours as needed. Patient not taking: Reported on 03/15/2016 03/04/16   Geoffery Lyons, MD  ibuprofen (ADVIL,MOTRIN) 800 MG tablet Take 1 tablet (800 mg total) by mouth 3 (three) times daily. Patient not taking: Reported on 03/15/2016 01/22/16   Garlon Hatchet, PA-C  predniSONE (DELTASONE) 20 MG tablet Take 2 tablets (40 mg total) by mouth daily for 5 days. 01/17/18 01/22/18  Nayra Coury, PA-C  ritonavir (NORVIR) 100 MG TABS tablet Take 1 tablet (100 mg total) by mouth daily. 02/06/14   Randall Hiss, MD  TRUVADA 200-300 MG per tablet TAKE 1 TABLET BY MOUTH DAILY 05/12/14   [name], [place]  N, MD  valACYclovir (VALTREX) 1000 MG tablet Take 1 tablet (1,000 mg total) by mouth 3 (three) times daily for 7 days. 01/17/18 01/24/18  Vinicio Lynk, PA-C    Family History No family history on file.  Social History Social History   Tobacco Use  . Smoking status: Current Every Day Smoker    Packs/day: 0.40    Years: 17.00    Pack years: 6.80  . Smokeless tobacco: Never Used  Substance Use Topics  . Alcohol use: No    Comment: occassionally  . Drug use: Yes    Types: Marijuana     Allergies   Patient has no known allergies.   Review of Systems Review of Systems  Constitutional: Negative for  chills and fever.  Eyes: Negative for visual disturbance.  Skin: Positive for rash.     Physical Exam Updated Vital Signs BP (!) 141/100   Pulse 79   Temp 98 F (36.7 C) (Oral)   Resp 16   Ht 5\' 11"  (1.803 m)   Wt 65.8 kg (145 lb)   SpO2 100%   BMI 20.22 kg/m   Physical Exam  Constitutional: He is oriented to person, place, and time. He appears well-developed and well-nourished. No distress.  HENT:  Head: Normocephalic and atraumatic.  Eyes: EOM are normal.  Neck: Normal range of motion.  Cardiovascular: Normal rate, regular rhythm and intact distal pulses.  Pulmonary/Chest: Effort normal and breath sounds normal. No respiratory distress. He has no wheezes.  Abdominal: Soft. He exhibits no distension and no mass. There is no tenderness. There is no guarding.  Musculoskeletal: Normal range of motion.  Strength intact bilaterally.  Radial pulses equal bilaterally.  Neurological: He is alert and oriented to person, place, and time.  Skin: Skin is warm. Rash noted.  Vesicular/blistering rash of right arm starting in the thenar eminence and extending to the right shoulder blade in a dermatomal pattern.  No fluid draining.  No purulent drainage or signs of infection.  Psychiatric: He has a normal mood and affect.  Nursing note and vitals reviewed.    ED Treatments / Results  Labs (all labs ordered are listed, but only abnormal results are displayed) Labs Reviewed - No data to display  EKG None  Radiology No results found.  Procedures Procedures (including critical care time)  Medications Ordered in ED Medications - No data to display   Initial Impression / Assessment and Plan / ED Course  I have reviewed the triage vital signs and the nursing notes.  Pertinent labs & imaging results that were available during my care of the patient were reviewed by me and considered in my medical decision making (see chart for details).     Patient presenting for evaluation of  rash.  Rash appearance and dermatomal pattern consistent with herpes.  Doubt SJS, TEN, RMSF, or syphilis. Patient at high risk due to untreated HIV status.  Discussed findings with patient.  Discussed treatment with Valtrex and prednisone.  Encouraged follow-up with infectious disease for HIV management.  Discussed that herpes is contagious until blisters have resolved.  Case discussed with attending, Dr. Adela LankFloyd agrees to plan, does not believe further antibiotics or treatment is necessary at this time.  At this time, patient appears safe for discharge.  Return precautions given.  Patient states he understands and agrees to plan.  Final Clinical Impressions(s) / ED Diagnoses   Final diagnoses:  Herpes zoster without complication    ED Discharge Orders  Ordered    valACYclovir (VALTREX) 1000 MG tablet  3 times daily     01/17/18 1527    predniSONE (DELTASONE) 20 MG tablet  Daily     01/17/18 1527       Khayree Delellis, Jeanette Caprice, PA-C 01/17/18 1824    Melene Plan, DO 01/18/18 1459

## 2018-01-17 NOTE — ED Notes (Signed)
Bed: WLPT4 Expected date:  Expected time:  Means of arrival:  Comments: 

## 2018-01-17 NOTE — Discharge Instructions (Signed)
It is very important that you take the antiviral as prescribed. Try to refrain from itching/scratching, as this leads to opportunity for infection.  You are no longer contagious when you no longer have the blisters.  It is up to your job to decide if they are agreeable to you wearing a glove/covering the blisters.  It is important that you follow up with Dr. Daiva EvesVan Dam. Return to the ER if you develop high fevers, difficulty breathing, numbness, or any new or concerning symptoms.

## 2018-01-22 ENCOUNTER — Encounter (HOSPITAL_COMMUNITY): Payer: Self-pay | Admitting: *Deleted

## 2018-01-22 ENCOUNTER — Other Ambulatory Visit: Payer: Self-pay

## 2018-01-22 ENCOUNTER — Emergency Department (HOSPITAL_COMMUNITY)
Admission: EM | Admit: 2018-01-22 | Discharge: 2018-01-22 | Disposition: A | Discharge status: Home / Self Care | Payer: Self-pay | Attending: Emergency Medicine | Admitting: Emergency Medicine

## 2018-01-22 DIAGNOSIS — Z79899 Other long term (current) drug therapy: Secondary | ICD-10-CM | POA: Insufficient documentation

## 2018-01-22 DIAGNOSIS — B029 Zoster without complications: Secondary | ICD-10-CM | POA: Insufficient documentation

## 2018-01-22 DIAGNOSIS — F17200 Nicotine dependence, unspecified, uncomplicated: Secondary | ICD-10-CM | POA: Insufficient documentation

## 2018-01-22 NOTE — ED Notes (Signed)
Bed: UE45 Expected date:  Expected time:  Means of arrival:  Comments: Shingles

## 2018-01-22 NOTE — Discharge Instructions (Signed)
Please continue to finish the medication previously prescribed. Follow-up with infectious disease as soon as possible.  Ideally, you would see them in the office this week. Return to the ED for cough, shortness of breath, neck pain/stiffness, severe headache, worsening rash, ear pain, vision changes, or any other major concerns.

## 2018-01-22 NOTE — ED Triage Notes (Signed)
Pt was seen 5 days ago for shingles. Pt states he is out of the medication and his blisters are still draining.

## 2018-01-22 NOTE — ED Provider Notes (Signed)
[facility]-EMERGENCY DEPT Provider Note   CSN: [id number] Arrival date & time: 01/22/18  1051     History   Chief Complaint Chief Complaint  Patient presents with  . Herpes Zoster    HPI Arthur [name] is a 37 y.o. male.  HPI   Arthur [name] is a 37 y.o. male, with a history of HIV, presenting to the ED with a blistering rash to the right arm for about the past week.  He endorses severe burning pain with the rash.  He has concern because he was seen in the ED on April 24, prescribed valacyclovir and prednisone, has finished the course, and is concerned that the rash has not resolved. There are no new lesions and it does not seem to be worsening or spreading.  Denies fever/chills, nausea/vomiting, cough, shortness of breath, oral lesions, vision changes, ear pain, headache, neck pain/stiffness, neuro deficits, abdominal pain, or any other complaints.  Patient has not followed up with ID "in a while."  He cannot tell me when his last office visit was.  He does state he has been taking his antiretroviral medications.  Last noted CD4 count was 450 and HIV RNA quant 32 in 2015.   Past Medical History:  Diagnosis Date  . Anemia   . Anxiety   . Depression   . GERD (gastroesophageal reflux disease)   . HIV infection (HCC)   . Substance abuse [facility])     Patient Active Problem List   Diagnosis Date Noted  . Nausea and vomiting 03/25/2013  . Muscle spasm 03/25/2013  . Drug use 01/11/2012  . 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 COUGH 06/23/2009  . CHEST PAIN, PLEURITIC 06/23/2009  . FUNGAL DERMATITIS 05/28/2008  . GERD 05/28/2008  . DENTAL CARIES 02/13/2008  . NICOTINE ADDICTION 11/14/2007  . BACK STRAIN, LUMBAR 08/22/2007  . 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 WEIGHT LOSS, RECENT 05/01/2007  . ANXIETY DEPRESSION 02/14/2007  . HIV DISEASE 02/13/2007  . CERVICAL LYMPHADENOPATHY, ANTERIOR, RIGHT 02/13/2007    Past Surgical History:  Procedure Laterality Date    . TOOTH EXTRACTION          Home Medications    Prior to Admission medications   Medication Sig Start Date End Date Taking? Authorizing Provider  Darunavir Ethanolate (PREZISTA) 800 MG tablet Take 1 tablet (800 mg total) by mouth daily. 02/06/14  Yes [name], [name], MD  predniSONE (DELTASONE) 20 MG tablet Take 2 tablets (40 mg total) by mouth daily for 5 days. 01/17/18 01/22/18 Yes Caccavale, Sophia, PA-C  valACYclovir (VALTREX) 1000 MG tablet Take 1 tablet (1,000 mg total) by mouth 3 (three) times daily for 7 days. 01/17/18 01/24/18 Yes Caccavale, Sophia, PA-C  cephALEXin (KEFLEX) 500 MG capsule Take 1 capsule (500 mg total) by mouth 4 (four) times daily. Patient not taking: Reported on 01/22/2018 03/15/16   Audry Pili, PA-C  citalopram (CELEXA) 40 MG tablet Take 1 tablet (40 mg total) by mouth daily. Patient not taking: Reported on 01/22/2018 02/06/14   [name], [name], MD  cyclobenzaprine (FLEXERIL) 10 MG tablet Take 1 tablet (10 mg total) by mouth 3 (three) times daily as needed for muscle spasms. Patient not taking: Reported on 01/22/2018 09/03/13   [name], [name], MD  HYDROcodone-acetaminophen [facility]) 5-325 MG tablet Take 1-2 tablets by mouth every 6 (six) hours as needed. Patient not taking: Reported on 01/22/2018 03/04/16   Geoffery Lyons, MD  ibuprofen (ADVIL,MOTRIN) 800 MG tablet Take 1 tablet (800  mg total) by mouth 3 (three) times daily. Patient not taking: Reported on 01/22/2018 01/22/16   Garlon Hatchet, PA-C  ritonavir (NORVIR) 100 MG TABS tablet Take 1 tablet (100 mg total) by mouth daily. Patient not taking: Reported on 01/22/2018 02/06/14   [name], [name], MD  TRUVADA 200-300 MG per tablet TAKE 1 TABLET BY MOUTH DAILY Patient not taking: Reported on 01/22/2018 05/12/14   [name], [name], MD    Family History No family history on file.  Social History Social History   Tobacco Use  . Smoking status: Current Every Day Smoker    Packs/day: 0.40    Years:  17.00    Pack years: 6.80  . Smokeless tobacco: Never Used  Substance Use Topics  . Alcohol use: No    Comment: occassionally  . Drug use: Yes    Types: Marijuana     Allergies   Patient has no known allergies.   Review of Systems Review of Systems  Constitutional: Negative for chills and fever.  HENT: Negative for ear discharge and ear pain.   Respiratory: Negative for cough and shortness of breath.   Cardiovascular: Negative for chest pain.  Gastrointestinal: Negative for abdominal pain, nausea and vomiting.  Musculoskeletal: Negative for neck pain and neck stiffness.  Skin: Positive for rash.  Neurological: Negative for dizziness, seizures, weakness, light-headedness, numbness and headaches.  All other systems reviewed and are negative.    Physical Exam Updated Vital Signs BP (!) 147/80   Pulse 88   Temp 98.1 F (36.7 C) (Oral)   Resp 15   SpO2 100%   Physical Exam  Constitutional: He appears well-developed and well-nourished. No distress.  HENT:  Head: Normocephalic and atraumatic.  No noted facial, intraoral, or periorbital lesions.  No noted lesions in the ear canal.  Eyes: Pupils are equal, round, and reactive to light. Conjunctivae and EOM are normal.  Neck: Normal range of motion. Neck supple.  Cardiovascular: Normal rate, regular rhythm, normal heart sounds and intact distal pulses.  Pulmonary/Chest: Effort normal and breath sounds normal. No respiratory distress.  Abdominal: Soft. There is no tenderness. There is no guarding.  Musculoskeletal: He exhibits no edema.  Normal motor function intact in all extremities and spine. No midline spinal tenderness.   Lymphadenopathy:    He has no cervical adenopathy.  Neurological: He is alert.  No sensory deficits. Strength 5/5 in all extremities. No gait disturbance. Coordination intact including heel to shin and finger to nose. Cranial nerves III-XII grossly intact. No facial droop.    Skin: Skin is warm and  dry. He is not diaphoretic.  Raised, mildly tender, crusted lesions to the right upper and lower arm. Vesicular appearing lesions to the patient's right wrist and palm that are quite tender to the touch.  No noted surrounding erythema or swelling.  Psychiatric: He has a normal mood and affect. His behavior is normal.  Nursing note and vitals reviewed.                ED Treatments / Results  Labs (all labs ordered are listed, but only abnormal results are displayed) Labs Reviewed - No data to display  EKG None  Radiology No results found.  Procedures Procedures (including critical care time)  Medications Ordered in ED Medications - No data to display   Initial Impression / Assessment and Plan / ED Course  I have reviewed the triage vital signs and the nursing notes.  Pertinent labs & imaging  results that were available during my care of the patient were reviewed by me and considered in my medical decision making (see chart for details).     Patient presents with concern for continued rash.  Lesions have the appearance and behavior of herpes zoster.  He has no new lesions and no symptoms consistent with disseminated zoster, ophthalmic zoster, or [facility]ay Hunt.  Presentation does not have the appearance of bacterial superinfection. Patient is nontoxic appearing, afebrile, not tachycardic, not tachypneic, not hypotensive, maintains SPO2 of 100% on room air, and is in no apparent distress.  Patient to continue with his previously prescribed antiviral therapy.  Strongly urged to follow-up with infectious disease this week. The patient was given instructions for home care as well as return precautions. Patient voices understanding of these instructions, accepts the plan, and is comfortable with discharge.  Findings and plan of care discussed with Delbert Phenix, MD.   Vitals:   01/22/18 1118 01/22/18 1606 01/22/18 1607 01/22/18 1614  BP: (!) 147/80 (!) 146/104 (!) 146/104     Pulse: 88 73 66 86  Resp: 15 18    Temp: 98.1 F (36.7 C) 98.3 F (36.8 C)    TempSrc: Oral Oral    SpO2: 100% 100% 100% 100%  Weight:  61.2 kg (135 lb)    Height:   (1.803 m)       Final Clinical Impressions(s) / ED Diagnoses   Final diagnoses:  Herpes zoster without complication    ED Discharge Orders    None       Concepcion Living 01/22/18 1636    Mabe, Latanya Maudlin, MD 01/22/18 [phone]

## 2018-01-22 NOTE — ED Notes (Signed)
Pt is alert and oriented x 4 and is verbally responsive. Pt denies pain at this time. Pt has shingles and arm sores have scabbed over but are sores have blisters and serous drainage.

## 2018-01-23 ENCOUNTER — Telehealth: Payer: Self-pay | Admitting: *Deleted

## 2018-01-23 NOTE — Telephone Encounter (Signed)
RN called phone number listed on his chart.  The call was answered by patient's mother who stated the patient wasn't there. RN asked if there was a better number to call him, stating I'm a nurse calling for follow up.  Patient's mother said there was not.  RN asked mother to have Arthur [name] call his doctor's office. Andree Coss, RN   [name], [name], MD  P [facility] Triage Nurse Pool        Can we get [facility] in for a visit within next week to make sure he is ok?   Previous Messages    ----- Message -----  From: Holley Raring, RN  Sent: 01/22/2018  5:09 PM  To: Randall Hiss, MD

## 2018-01-24 NOTE — Telephone Encounter (Signed)
Thanks Michelle

## 2018-01-26 ENCOUNTER — Telehealth: Payer: Self-pay

## 2018-02-01 ENCOUNTER — Ambulatory Visit: Payer: Self-pay

## 2018-06-12 NOTE — Telephone Encounter (Signed)
error 

## 2019-04-15 ENCOUNTER — Other Ambulatory Visit: Payer: Self-pay

## 2019-04-15 ENCOUNTER — Emergency Department (HOSPITAL_COMMUNITY): Payer: Self-pay

## 2019-04-15 ENCOUNTER — Encounter (HOSPITAL_COMMUNITY): Payer: Self-pay

## 2019-04-15 ENCOUNTER — Emergency Department (HOSPITAL_COMMUNITY)
Admission: EM | Admit: 2019-04-15 | Discharge: 2019-04-15 | Disposition: A | Discharge status: Home / Self Care | Payer: Self-pay | Attending: Emergency Medicine | Admitting: Emergency Medicine

## 2019-04-15 DIAGNOSIS — Z79899 Other long term (current) drug therapy: Secondary | ICD-10-CM | POA: Insufficient documentation

## 2019-04-15 DIAGNOSIS — L03211 Cellulitis of face: Secondary | ICD-10-CM | POA: Insufficient documentation

## 2019-04-15 DIAGNOSIS — Z21 Asymptomatic human immunodeficiency virus [HIV] infection status: Secondary | ICD-10-CM | POA: Insufficient documentation

## 2019-04-15 DIAGNOSIS — F17200 Nicotine dependence, unspecified, uncomplicated: Secondary | ICD-10-CM | POA: Insufficient documentation

## 2019-04-15 DIAGNOSIS — K047 Periapical abscess without sinus: Secondary | ICD-10-CM | POA: Insufficient documentation

## 2019-04-15 LAB — COMPREHENSIVE METABOLIC PANEL
ALT: 14 U/L (ref 0–44)
AST: 27 U/L (ref 15–41)
Albumin: 3.3 g/dL — ABNORMAL LOW (ref 3.5–5.0)
Alkaline Phosphatase: 63 U/L (ref 38–126)
Anion gap: 8 (ref 5–15)
BUN: 7 mg/dL (ref 6–20)
CO2: 25 mmol/L (ref 22–32)
Calcium: 8.8 mg/dL — ABNORMAL LOW (ref 8.9–10.3)
Chloride: 100 mmol/L (ref 98–111)
Creatinine, Ser: 0.85 mg/dL (ref 0.61–1.24)
GFR calc Af Amer: >60 mL/min (ref >60)
GFR calc non Af Amer: >60 mL/min (ref >60)
Glucose, Bld: 91 mg/dL (ref 70–99)
Potassium: 3.8 mmol/L (ref 3.5–5.1)
Sodium: 133 mmol/L — ABNORMAL LOW (ref 135–145)
Total Bilirubin: 0.6 mg/dL (ref 0.3–1.2)
Total Protein: 10 g/dL — ABNORMAL HIGH (ref 6.5–8.1)

## 2019-04-15 LAB — CBC WITH DIFFERENTIAL/PLATELET
Abs Immature Granulocytes: 0.02 10*3/uL (ref 0.00–0.07)
Basophils Absolute: 0 10*3/uL (ref 0.0–0.1)
Basophils Relative: 0 %
Eosinophils Absolute: 0.1 10*3/uL (ref 0.0–0.5)
Eosinophils Relative: 3 %
HCT: 42 % (ref 39.0–52.0)
Hemoglobin: 13.5 g/dL (ref 13.0–17.0)
Immature Granulocytes: 1 %
Lymphocytes Relative: 30 %
Lymphs Abs: 1.1 10*3/uL (ref 0.7–4.0)
MCH: 30.6 pg (ref 26.0–34.0)
MCHC: 32.1 g/dL (ref 30.0–36.0)
MCV: 95.2 fL (ref 80.0–100.0)
Monocytes Absolute: 0.4 10*3/uL (ref 0.1–1.0)
Monocytes Relative: 10 %
Neutro Abs: 2.1 10*3/uL (ref 1.7–7.7)
Neutrophils Relative %: 56 %
Platelets: 343 10*3/uL (ref 150–400)
RBC: 4.41 MIL/uL (ref 4.22–5.81)
RDW: 13.8 % (ref 11.5–15.5)
WBC: 3.7 10*3/uL — ABNORMAL LOW (ref 4.0–10.5)
nRBC: 0 % (ref 0.0–0.2)

## 2019-04-15 IMAGING — CT CT MAXILLOFACIAL WITH CONTRAST
3 series · 16 of 47 positions shown, 19 images · IV contrast (omnipaque)
Comparison: None

CLINICAL DATA: Swelling of the left upper lip area.

EXAM:
CT MAXILLOFACIAL WITH CONTRAST
TECHNIQUE: Multidetector CT imaging of the maxillofacial structures was
performed with intravenous contrast. Multiplanar CT image
reconstructions were also generated.
CONTRAST:  75mL OMNIPAQUE IOHEXOL 300 MG/ML  SOLN

[Series 3: max soft · axial · 0.31mm/px · z∈[+1351,+1489]mm · 10 of 81 slices shown, 13 images]
[im 6/81  brain]
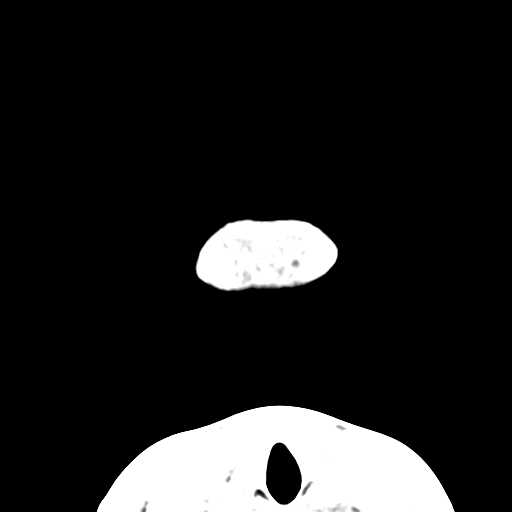
[im 6/81  bone]
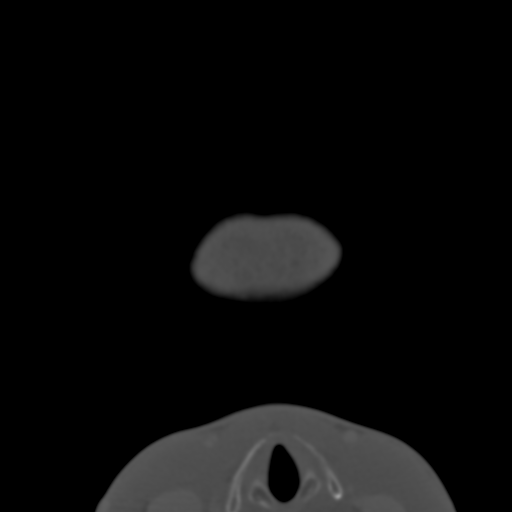
[im 14/81  bone]
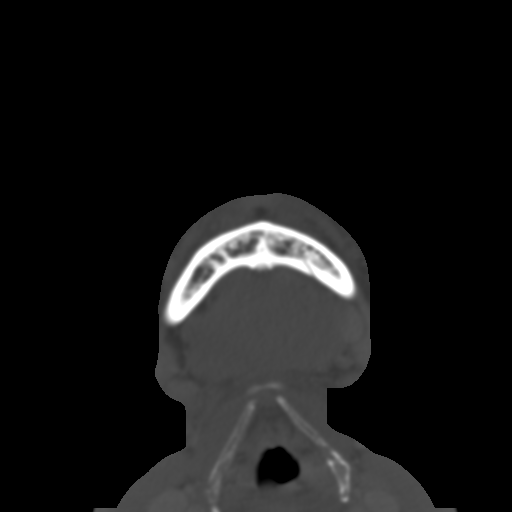
[im 23/81  bone]
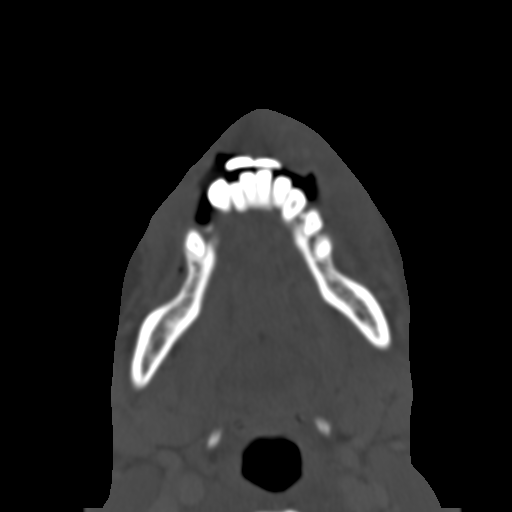
[im 28/81  bone]
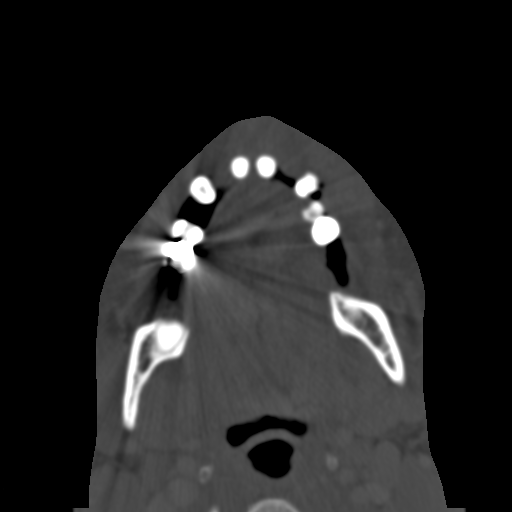
[im 36/81  brain]
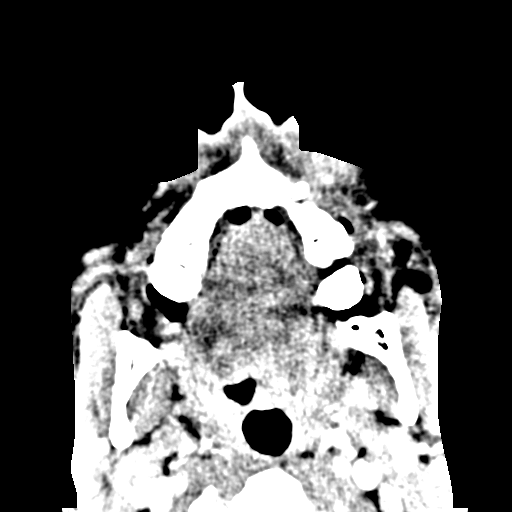
[im 36/81  bone]
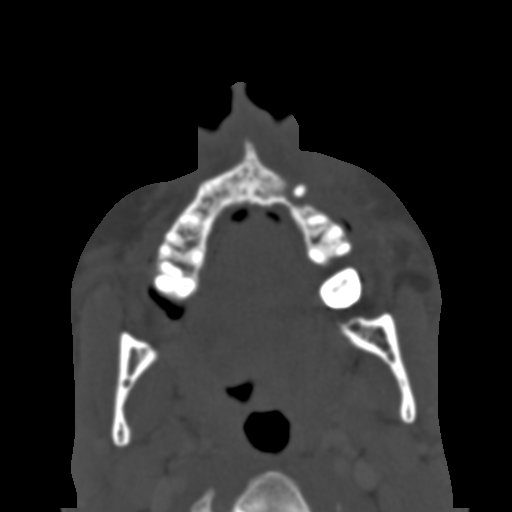
[im 45/81  bone]
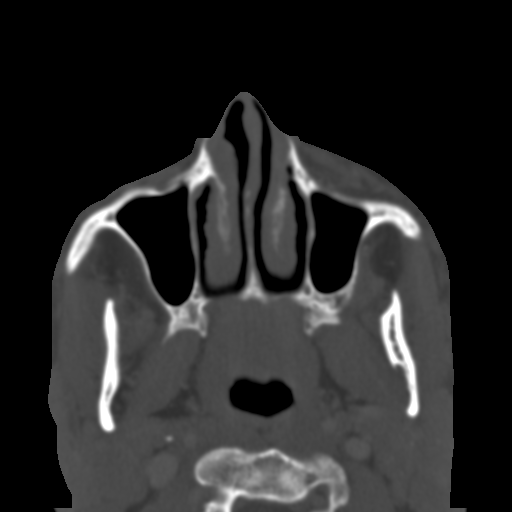
[im 53/81  bone]
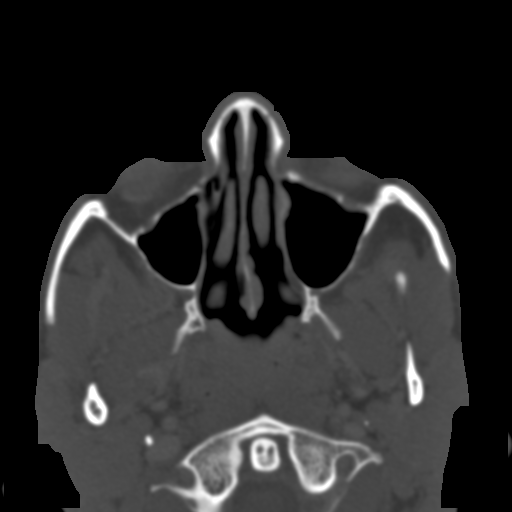
[im 61/81  bone]
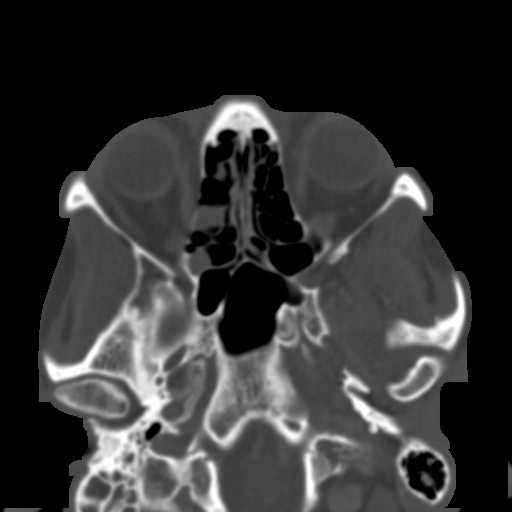
[im 67/81  brain]
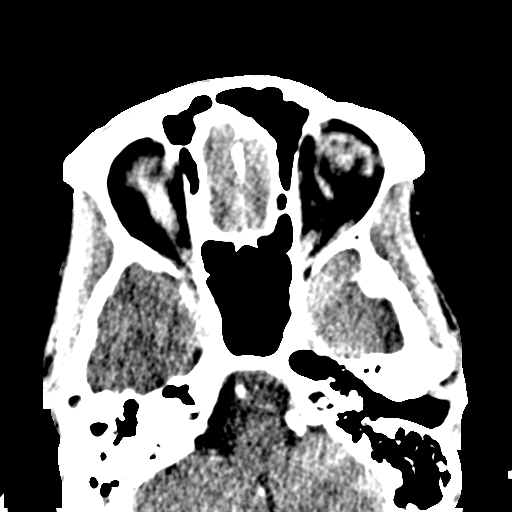
[im 67/81  bone]
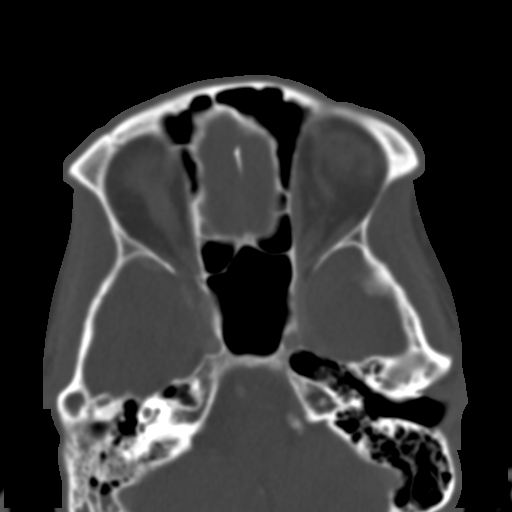
[im 75/81  bone]
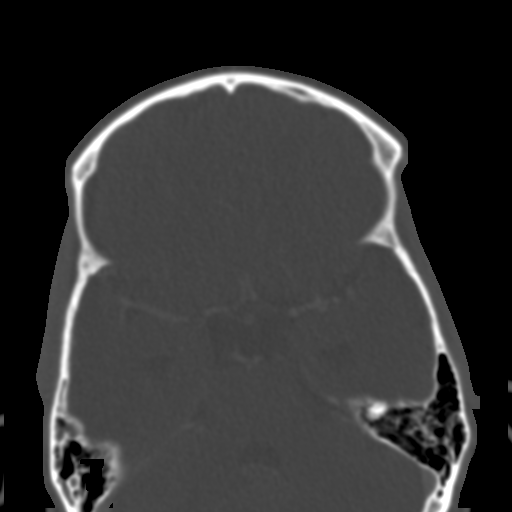

[Series 7: coronal soft · coronal · 0.30mm/px · 3 of 68 slices shown]
[im 23/68  bone]
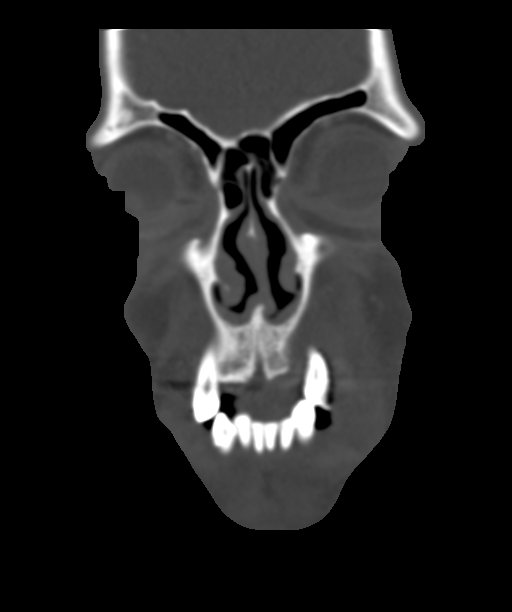
[im 30/68  bone]
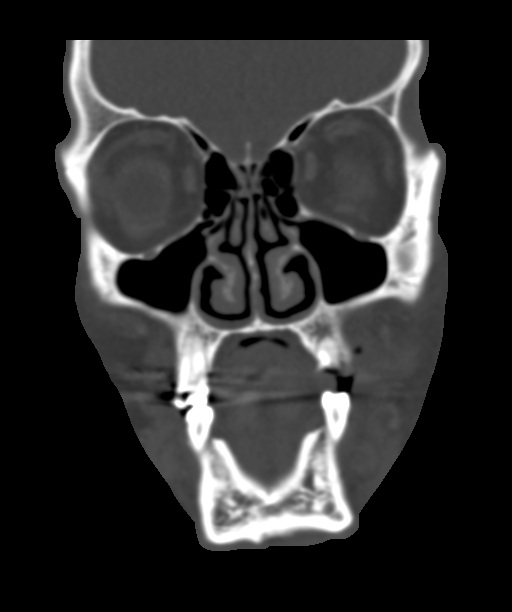
[im 38/68  bone]
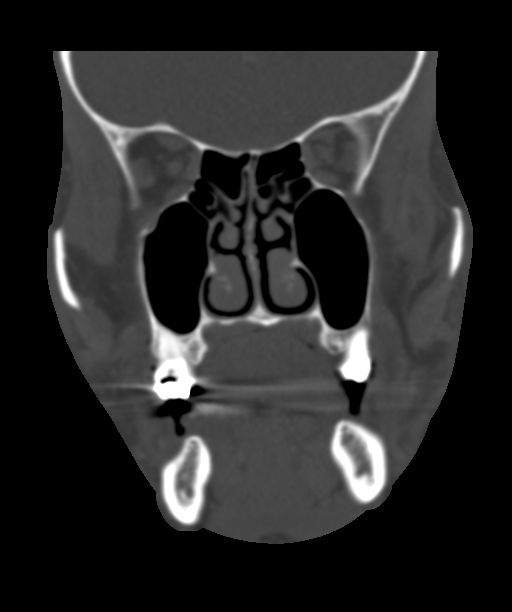

[Series 8: sagittal soft · sagittal · 0.33mm/px · 3 of 72 slices shown]
[im 24/72  bone]
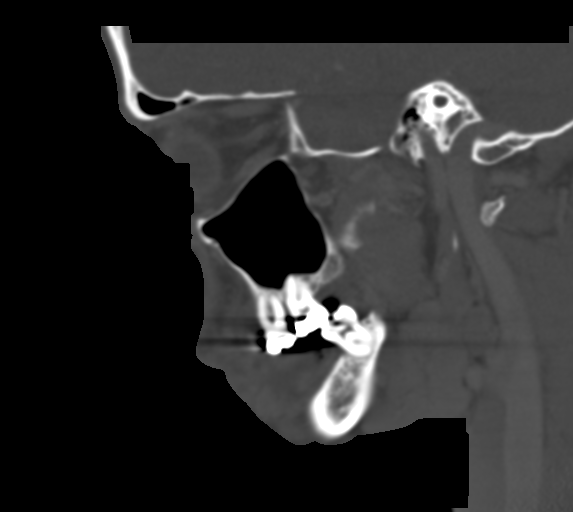
[im 36/72  bone]
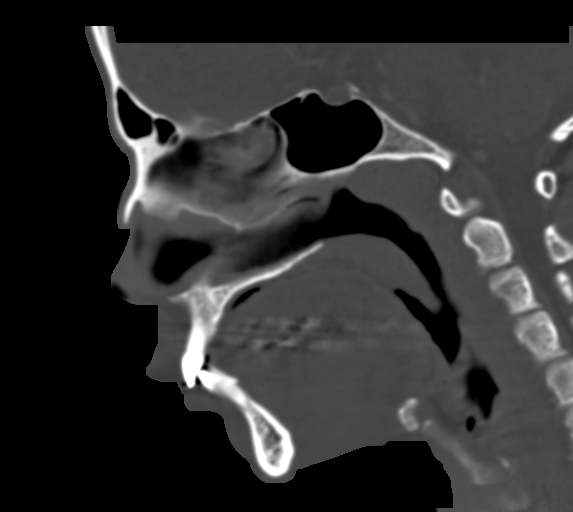
[im 48/72  bone]
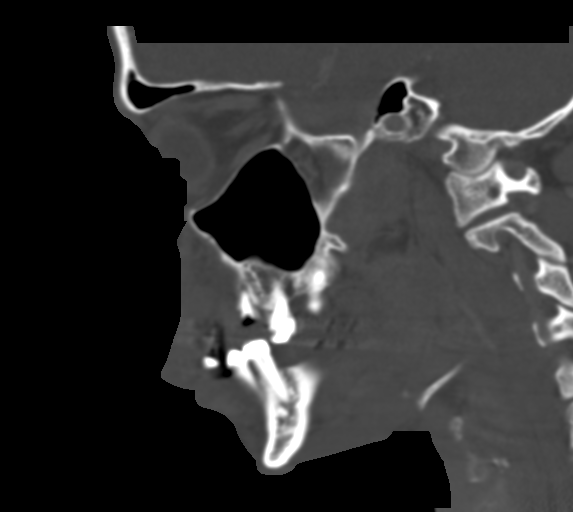

[16 of 47 positions shown; findings below may reference images not displayed]

FINDINGS: Osseous: Widespread dental decay and periodontal disease. 2 11 is
free-floating, with complete erosion of the surrounding maxillary
bone. This is probably the site of origin of the regional
inflammation.

Orbits: Normal

Sinuses: Scattered opacified ethmoid air cells. Other sinuses clear.

Soft tissues: Left facial soft tissue swelling consistent with
cellulitis. No evidence of drainable abscess.

Limited intracranial: Normal
IMPRESSION: Left mid face soft tissue cellulitis, probably as result of advanced
dental and periodontal disease of tooth 11, which is free-floating
with complete erosion of the surrounding maxillary bone. Advanced
dental and periodontal disease elsewhere throughout.

## 2019-04-15 MED ORDER — OXYCODONE-ACETAMINOPHEN 5-325 MG PO TABS
1.0000 | ORAL_TABLET | Freq: Once | ORAL | Status: AC
  Administered 2019-04-15: 21:00:00 1 via ORAL
  Filled 2019-04-15: qty 1

## 2019-04-15 MED ORDER — SODIUM CHLORIDE (PF) 0.9 % IJ SOLN
INTRAMUSCULAR | Status: AC
  Filled 2019-04-15: qty 50

## 2019-04-15 MED ORDER — LIDOCAINE VISCOUS HCL 2 % MT SOLN: 15.0000 mL | OROMUCOSAL | 0 refills | Status: DC | PRN

## 2019-04-15 MED ORDER — CLINDAMYCIN HCL 150 MG PO CAPS: 300.0000 mg | ORAL_CAPSULE | Freq: Three times a day (TID) | ORAL | 0 refills | Status: AC

## 2019-04-15 MED ORDER — SODIUM CHLORIDE 0.9 % IV BOLUS
500.0000 mL | Freq: Once | INTRAVENOUS | Status: AC
  Administered 2019-04-15: 500 mL via INTRAVENOUS

## 2019-04-15 MED ORDER — IOHEXOL 300 MG/ML  SOLN
75.0000 mL | Freq: Once | INTRAMUSCULAR | Status: AC | PRN
  Administered 2019-04-15: 75 mL via INTRAVENOUS

## 2019-04-15 MED ORDER — CLINDAMYCIN PHOSPHATE 600 MG/50ML IV SOLN
600.0000 mg | Freq: Once | INTRAVENOUS | Status: AC
  Administered 2019-04-15: 600 mg via INTRAVENOUS
  Filled 2019-04-15: qty 50

## 2019-04-15 MED ORDER — MORPHINE SULFATE (PF) 4 MG/ML IV SOLN
4.0000 mg | Freq: Once | INTRAVENOUS | Status: AC
  Administered 2019-04-15: 4 mg via INTRAVENOUS
  Filled 2019-04-15: qty 1

## 2019-04-15 MED ORDER — OXYCODONE-ACETAMINOPHEN 5-325 MG PO TABS: 1.0000 | ORAL_TABLET | Freq: Four times a day (QID) | ORAL | 0 refills | Status: DC | PRN

## 2019-04-15 NOTE — ED Triage Notes (Signed)
Pt states he was supposed to have an upper left tooth moved several years ago, but was told he had to have it surgically removed. Pt states he has been unable to eat. Significant swelling noted to upper left lip area.

## 2019-04-15 NOTE — Discharge Instructions (Addendum)
Call Dr. Jackson Latino office tomorrow to set up an appointment.  When you go to the appointment, make sure you bring your discharge paperwork. Take antibiotics as prescribed. Take entire course, even if your symptoms improve. Use Tylenol and ibuprofen as needed for mild to moderate pain.  Use Percocet as needed for severe breakthrough pain.  Have caution, this is narcotic medicine.  Do not drive or operate heavy machinery while taking this medicine. You may use viscous lidocaine to help with numbing or with pain. Return to the emergency room if you develop high fevers, severe worsening pain, any new, worsening, concerning symptoms.

## 2019-04-15 NOTE — ED Notes (Signed)
Pt refused discharge vital signs

## 2019-04-15 NOTE — ED Provider Notes (Signed)
[facility] DEPT Provider Note   CSN: [id number] Arrival date & time: 04/15/19  1220     History   Chief Complaint Chief Complaint  Patient presents with  . Dental Pain    HPI Arthur [name] is a 38 y.o. male presenting for evaluation of dental pain and facial swelling.  Patient states for the past several years he is intermittently had issues with his front upper tooth.  He was told that it needs to be pulled by a oral surgeon, but has not scheduled that appointment.  Patient states he has had increased pain over the past several months, but he has been able to control it with mouthwash and over-the-counter pain reliever such as Tylenol and ibuprofen.  He ran out of the mouthwash 10 days ago, has not taken any over-the-counter medications today.  Patient states when he woke up this morning, his face, specifically his upper lip was very swollen, prompting him to come to the ER.  He denies recent fevers, chills, difficulty in his mouth, nausea, vomiting.  He has a history of HIV, states his viral load is undetectable.  He has no other medical problems, takes no other medications daily.      HPI  Past Medical History:  Diagnosis Date  . Anemia   . Anxiety   . Depression   . GERD (gastroesophageal reflux disease)   . HIV infection ([place])   . Substance abuse [facility])     Patient Active Problem List   Diagnosis Date Noted  . Nausea and vomiting 03/25/2013  . Muscle spasm 03/25/2013  . Drug use 01/11/2012  . 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 COUGH 06/23/2009  . CHEST PAIN, PLEURITIC 06/23/2009  . FUNGAL DERMATITIS 05/28/2008  . GERD 05/28/2008  . DENTAL CARIES 02/13/2008  . NICOTINE ADDICTION 11/14/2007  . BACK STRAIN, LUMBAR 08/22/2007  . 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 WEIGHT LOSS, RECENT 05/01/2007  . ANXIETY DEPRESSION 02/14/2007  . HIV DISEASE 02/13/2007  . CERVICAL LYMPHADENOPATHY, ANTERIOR, RIGHT 02/13/2007    Past Surgical History:   Procedure Laterality Date  . TOOTH EXTRACTION          Home Medications    Prior to Admission medications   Medication Sig Start Date End Date Taking? Authorizing Provider  cephALEXin (KEFLEX) 500 MG capsule Take 1 capsule (500 mg total) by mouth 4 (four) times daily. Patient not taking: Reported on 01/22/2018 03/15/16   Shary Decamp, PA-C  citalopram (CELEXA) 40 MG tablet Take 1 tablet (40 mg total) by mouth daily. Patient not taking: Reported on 01/22/2018 02/06/14   Tommy Medal, Lavell Islam, MD  clindamycin (CLEOCIN) 150 MG capsule Take 2 capsules (300 mg total) by mouth 3 (three) times daily for 7 days. 04/15/19 04/22/19  Clifton Safley, PA-C  cyclobenzaprine (FLEXERIL) 10 MG tablet Take 1 tablet (10 mg total) by mouth 3 (three) times daily as needed for muscle spasms. Patient not taking: Reported on 01/22/2018 09/03/13   Tommy Medal, Lavell Islam, MD  Darunavir Ethanolate (PREZISTA) 800 MG tablet Take 1 tablet (800 mg total) by mouth daily. 02/06/14   Truman Hayward, MD  HYDROcodone-acetaminophen [facility]) 5-325 MG tablet Take 1-2 tablets by mouth every 6 (six) hours as needed. Patient not taking: Reported on 01/22/2018 03/04/16   Veryl Speak, MD  ibuprofen (ADVIL,MOTRIN) 800 MG tablet Take 1 tablet (800 mg total) by mouth 3 (three) times daily. Patient not taking: Reported on 01/22/2018 01/22/16   Larene Pickett, PA-C  lidocaine (  XYLOCAINE) 2 % solution Use as directed 15 mLs in the mouth or throat as needed for mouth pain. 04/15/19   Nira Visscher, PA-C  oxyCODONE-acetaminophen (PERCOCET/ROXICET) 5-325 MG tablet Take 1 tablet by mouth every 6 (six) hours as needed for severe pain. 04/15/19   Hazley Dezeeuw, PA-C  ritonavir (NORVIR) 100 MG TABS tablet Take 1 tablet (100 mg total) by mouth daily. Patient not taking: Reported on 01/22/2018 02/06/14   Daiva EvesVan Dam, Lisette Grinderornelius N, MD  TRUVADA 200-300 MG per tablet TAKE 1 TABLET BY MOUTH DAILY Patient not taking: Reported on 01/22/2018 05/12/14   Daiva EvesVan Dam,  Lisette Grinderornelius N, MD    Family History No family history on file.  Social History Social History   Tobacco Use  . Smoking status: Current Every Day Smoker    Packs/day: 0.40    Years: 17.00    Pack years: 6.80  . Smokeless tobacco: Never Used  Substance Use Topics  . Alcohol use: No    Comment: occassionally  . Drug use: Yes    Types: Marijuana     Allergies   Patient has no known allergies.   Review of Systems Review of Systems  HENT: Positive for dental problem and facial swelling.   All other systems reviewed and are negative.    Physical Exam Updated Vital Signs BP 105/72   Pulse 81   Temp 99.7 F (37.6 C) (Oral)   Resp 18   Ht 5\' 11"  (1.803 m)   Wt 64 kg   SpO2 100%   BMI 19.67 kg/m   Physical Exam Vitals signs and nursing note reviewed.  Constitutional:      General: He is not in acute distress.    Appearance: He is well-developed.     Comments: Appears nontoxic  HENT:     Head: Normocephalic and atraumatic.     Mouth/Throat:     Mouth: Oral lesions present.     Dentition: Abnormal dentition. Dental tenderness and dental caries present.      Comments: From left upper tooth obviously infected with gum retraction.  I am unable to visualize the gum.  Tooth is erroding the inner mucosa of the upper lip. ttp of the upperlip with swelling. No active draiange. Foul smell from the mouth/tooth.  Overall patient with poor dentition and multiple dental caries. No trismus. Handling secretions well. Airway intact Eyes:     Extraocular Movements: Extraocular movements intact.     Conjunctiva/sclera: Conjunctivae normal.     Pupils: Pupils are equal, round, and reactive to light.     Comments: EOMI and PERRLA.  No entrapment.  No tenderness palpation or swelling periorbitally.  Neck:     Musculoskeletal: Normal range of motion and neck supple.  Cardiovascular:     Rate and Rhythm: Normal rate and regular rhythm.     Pulses: Normal pulses.  Pulmonary:      Effort: Pulmonary effort is normal. No respiratory distress.     Breath sounds: Normal breath sounds. No wheezing.  Abdominal:     General: There is no distension.     Palpations: Abdomen is soft. There is no mass.     Tenderness: There is no abdominal tenderness. There is no guarding or rebound.  Musculoskeletal: Normal range of motion.  Skin:    General: Skin is warm and dry.     Capillary Refill: Capillary refill takes less than 2 seconds.  Neurological:     Mental Status: He is alert and oriented to person, place, and  time.      ED Treatments / Results  Labs (all labs ordered are listed, but only abnormal results are displayed) Labs Reviewed  CBC WITH DIFFERENTIAL/PLATELET - Abnormal; Notable for the following components:      Result Value   WBC 3.7 (*)    All other components within normal limits  COMPREHENSIVE METABOLIC PANEL - Abnormal; Notable for the following components:   Sodium 133 (*)    Calcium 8.8 (*)    Total Protein 10.0 (*)    Albumin 3.3 (*)    All other components within normal limits    EKG None  Radiology Ct Maxillofacial W Contrast  Result Date: 04/15/2019 CLINICAL DATA:  Swelling of the left upper lip area. EXAM: CT MAXILLOFACIAL WITH CONTRAST TECHNIQUE: Multidetector CT imaging of the maxillofacial structures was performed with intravenous contrast. Multiplanar CT image reconstructions were also generated. CONTRAST:  75mL OMNIPAQUE IOHEXOL 300 MG/ML  SOLN COMPARISON:  None FINDINGS: Osseous: Widespread dental decay and periodontal disease. 2 11 is free-floating, with complete erosion of the surrounding maxillary bone. This is probably the site of origin of the regional inflammation. Orbits: Normal Sinuses: Scattered opacified ethmoid air cells. Other sinuses clear. Soft tissues: Left facial soft tissue swelling consistent with cellulitis. No evidence of drainable abscess. Limited intracranial: Normal IMPRESSION: Left mid face soft tissue cellulitis,  probably as result of advanced dental and periodontal disease of tooth 11, which is free-floating with complete erosion of the surrounding maxillary bone. Advanced dental and periodontal disease elsewhere throughout. Electronically Signed   By: Paulina FusiMark  Shogry M.D.   On: 04/15/2019 19:43    Procedures Procedures (including critical care time)  Medications Ordered in ED Medications  sodium chloride (PF) 0.9 % injection (has no administration in time range)  morphine 4 MG/ML injection 4 mg (4 mg Intravenous Given 04/15/19 1820)  clindamycin (CLEOCIN) IVPB 600 mg (0 mg Intravenous Stopped 04/15/19 1920)  sodium chloride 0.9 % bolus 500 mL (0 mLs Intravenous Stopped 04/15/19 2053)  iohexol (OMNIPAQUE) 300 MG/ML solution 75 mL (75 mLs Intravenous Contrast Given 04/15/19 1931)  oxyCODONE-acetaminophen (PERCOCET/ROXICET) 5-325 MG per tablet 1 tablet (1 tablet Oral Given 04/15/19 2048)     Initial Impression / Assessment and Plan / ED Course  I have reviewed the triage vital signs and the nursing notes.  Pertinent labs & imaging results that were available during my care of the patient were reviewed by me and considered in my medical decision making (see chart for details).        Patient presenting for evaluation of dental pain and facial swelling.  Physical exam shows obvious dental infection causing erosion of the inner upper lip.  Foul smell coming from the area, and, cannot visualize due to erosion.  In the setting of worsening facial swelling which extends up towards his eye sometimes, will order labs and CT to ensure no tracking towards the sinuses.  Clinda and morphine for symptom control.  Labs overall reassuring, no sign of systemic infection, doubt sepsis.  CT pending.  CT shows free floating tooth with almost complete erosion of the maxillary bone surrounding the tooth.  Discussed with attending, Dr. Patria Maneampos evaluated the patient.  Attempted to remove, however tooth was still firmly lodged.  No oral surgery on call at this time. As such, will given pt information for on call dentist. encouraged f/u with dentistry promptly. Patient to take antibiotics in the meantime.  Will give short course of narcotics for pain control, PMP checked, no recent  narcotic or controlled substance use.  At this time, patient received a discharge.  Return precautions given.  Patient states he understands and agrees plan.    Final Clinical Impressions(s) / ED Diagnoses   Final diagnoses:  Facial cellulitis  Dental infection    ED Discharge Orders         Ordered    clindamycin (CLEOCIN) 150 MG capsule  3 times daily     04/15/19 2043    oxyCODONE-acetaminophen (PERCOCET/ROXICET) 5-325 MG tablet  Every 6 hours PRN     04/15/19 2043    lidocaine (XYLOCAINE) 2 % solution  As needed     04/15/19 2043           Alveria ApleyCaccavale, Kimble Hitchens, PA-C 04/15/19 2158    Azalia Bilisampos, Kevin, MD 04/15/19 2255

## 2019-07-22 ENCOUNTER — Emergency Department (HOSPITAL_COMMUNITY): Payer: Self-pay

## 2019-07-22 ENCOUNTER — Other Ambulatory Visit: Payer: Self-pay

## 2019-07-22 ENCOUNTER — Encounter (HOSPITAL_COMMUNITY): Payer: Self-pay | Admitting: Emergency Medicine

## 2019-07-22 ENCOUNTER — Emergency Department (HOSPITAL_COMMUNITY)
Admission: EM | Admit: 2019-07-22 | Discharge: 2019-07-22 | Disposition: A | Discharge status: Home / Self Care | Payer: Self-pay | Attending: Emergency Medicine | Admitting: Emergency Medicine

## 2019-07-22 DIAGNOSIS — E860 Dehydration: Secondary | ICD-10-CM

## 2019-07-22 DIAGNOSIS — F17210 Nicotine dependence, cigarettes, uncomplicated: Secondary | ICD-10-CM | POA: Insufficient documentation

## 2019-07-22 DIAGNOSIS — R42 Dizziness and giddiness: Secondary | ICD-10-CM

## 2019-07-22 LAB — BASIC METABOLIC PANEL
Anion gap: 7 (ref 5–15)
BUN: 14 mg/dL (ref 6–20)
CO2: 24 mmol/L (ref 22–32)
Calcium: 9.1 mg/dL (ref 8.9–10.3)
Chloride: 100 mmol/L (ref 98–111)
Creatinine, Ser: 1.01 mg/dL (ref 0.61–1.24)
GFR calc Af Amer: >60 mL/min (ref >60)
GFR calc non Af Amer: >60 mL/min (ref >60)
Glucose, Bld: 89 mg/dL (ref 70–99)
Potassium: 4.3 mmol/L (ref 3.5–5.1)
Sodium: 131 mmol/L — ABNORMAL LOW (ref 135–145)

## 2019-07-22 LAB — URINALYSIS, ROUTINE W REFLEX MICROSCOPIC
Bacteria, UA: NONE SEEN
Bilirubin Urine: NEGATIVE
Glucose, UA: NEGATIVE mg/dL
Hgb urine dipstick: NEGATIVE
Ketones, ur: NEGATIVE mg/dL
Leukocytes,Ua: NEGATIVE
Nitrite: NEGATIVE
Protein, ur: 30 mg/dL — AB
Specific Gravity, Urine: 1.019 (ref 1.005–1.030)
pH: 6 (ref 5.0–8.0)

## 2019-07-22 LAB — CBC
HCT: 41.2 % (ref 39.0–52.0)
Hemoglobin: 13.3 g/dL (ref 13.0–17.0)
MCH: 29.9 pg (ref 26.0–34.0)
MCHC: 32.3 g/dL (ref 30.0–36.0)
MCV: 92.6 fL (ref 80.0–100.0)
Platelets: 307 10*3/uL (ref 150–400)
RBC: 4.45 MIL/uL (ref 4.22–5.81)
RDW: 13.6 % (ref 11.5–15.5)
WBC: 3.2 10*3/uL — ABNORMAL LOW (ref 4.0–10.5)
nRBC: 0 % (ref 0.0–0.2)

## 2019-07-22 LAB — CBG MONITORING, ED: Glucose-Capillary: 82 mg/dL (ref 70–99)

## 2019-07-22 IMAGING — DX DG CHEST 1V PORT
1 series · 1 of 1 positions shown · non-contrast
Comparison: Chest radiograph [DATE]

CLINICAL DATA: Per pt, states he has been feeling dizzy for the
past few days-denies any other symptoms-states he has not had his
HIV meds in awhile

EXAM:
PORTABLE CHEST 1 VIEW

[chest ap]
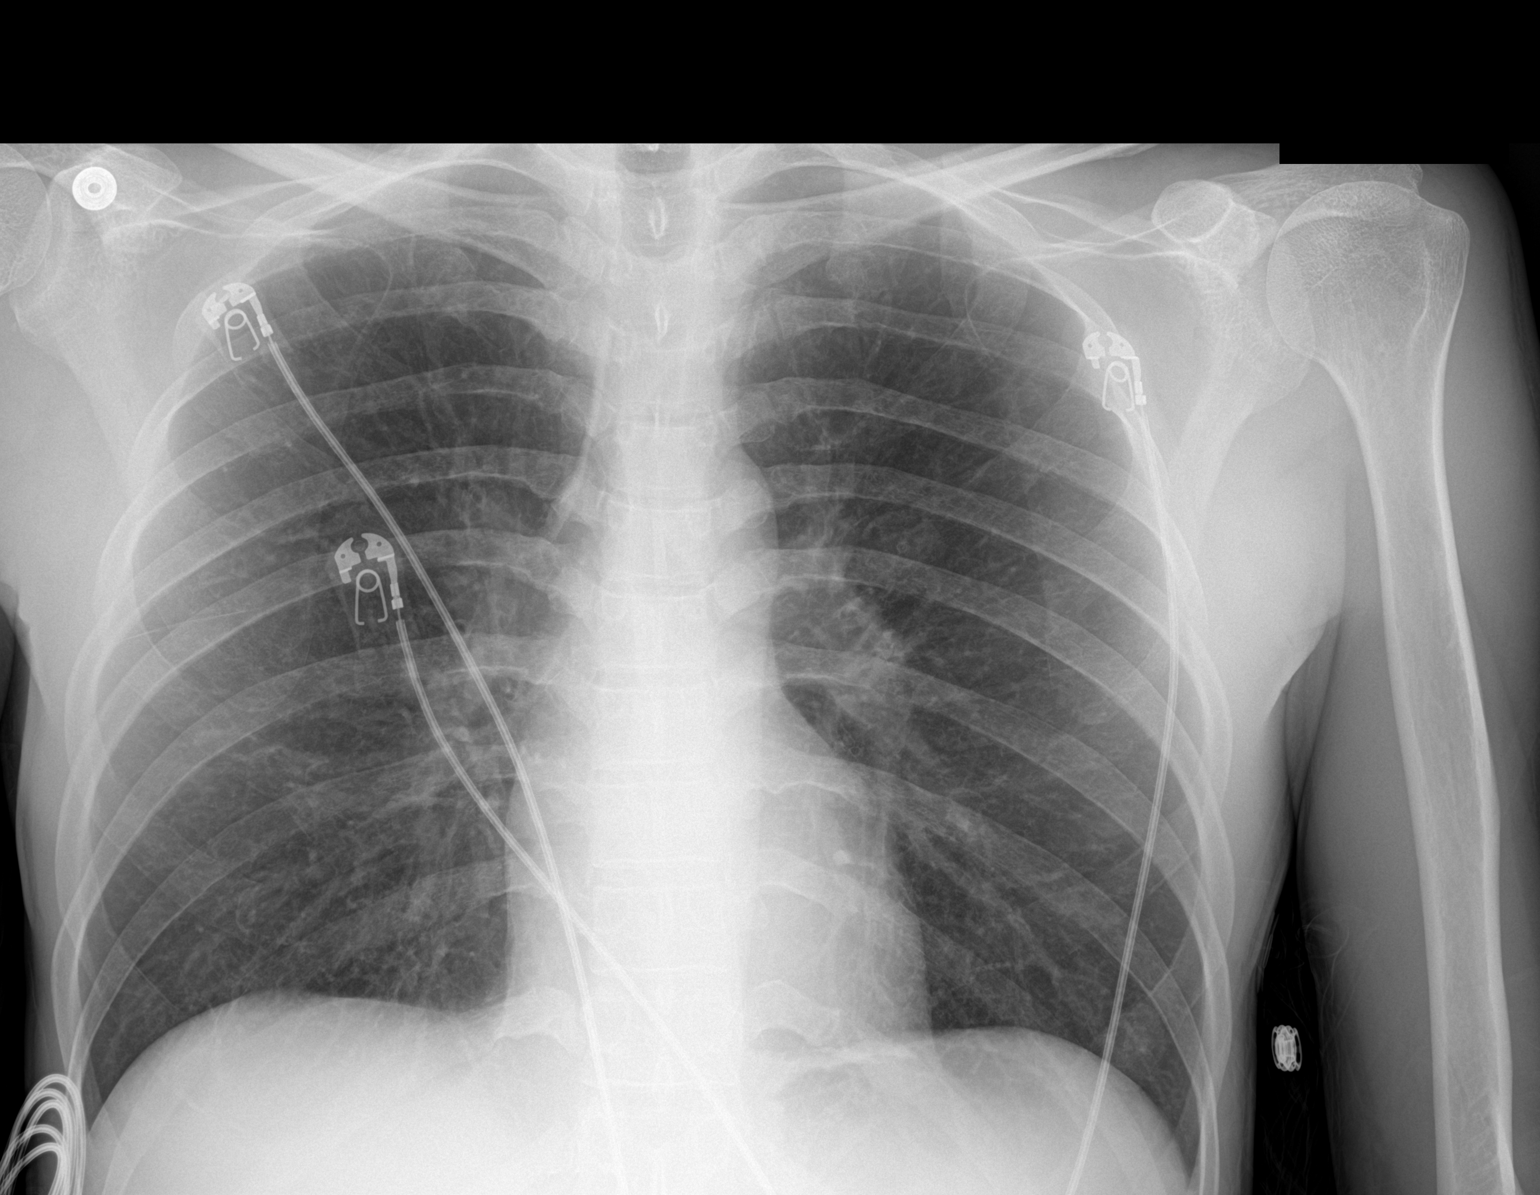

[1 of 1 positions shown; findings below may reference images not displayed]

FINDINGS: The heart size and mediastinal contours are within normal limits.
The lungs are clear. No pneumothorax or large pleural effusion. The
visualized skeletal structures are unremarkable.
IMPRESSION: No acute cardiopulmonary process.

## 2019-07-22 MED ORDER — SODIUM CHLORIDE 0.9 % IV BOLUS
1000.0000 mL | Freq: Once | INTRAVENOUS | Status: AC
  Administered 2019-07-22: 12:00:00 1000 mL via INTRAVENOUS

## 2019-07-22 NOTE — ED Triage Notes (Signed)
Per pt, states he has been feeling dizzy for the past few days-denies any other symptoms-states he has not had his HIV meds in awhile

## 2019-07-22 NOTE — Discharge Instructions (Addendum)
You were evaluated in the Emergency Department and after careful evaluation, we did not find any emergent condition requiring admission or further testing in the hospital.  Your exam/testing today is overall reassuring.  Continue to drink fluids at home and follow-up with the arranged appointments.  Please return to the Emergency Department if you experience any worsening of your condition.  We encourage you to follow up with a primary care provider.  Thank you for allowing [place] to be a part of your care.

## 2019-07-22 NOTE — ED Provider Notes (Signed)
[facility] DEPT [facility] Emergency Department Provider Note MRN:  [id number]  Arrival date & time: 07/22/19     Chief Complaint   Dizziness   History of Present Illness   Arthur [name] is a 38 y.o. year-old male with a history of HIV presenting to the ED with chief complaint of dizziness.  Mild lightheadedness for the past week.  Has not been eating or drinking well for the past 2 weeks.  Took out the trash last night, came back into the house, became very lightheaded and then had a syncopal episode.  Denies any trauma or pain from the fall.  Denies chest pain before or after the syncopal event.  Denies fever, no cough, no abdominal pain, no dysuria, no rash, no vomiting, no diarrhea.  He is concerned because he has not been able to afford his HIV medication and has not taken it in 1 year.  Review of Systems  A complete 10 system review of systems was obtained and all systems are negative except as noted in the HPI and PMH.   Patient's Health History    Past Medical History:  Diagnosis Date  . Anemia   . Anxiety   . Depression   . GERD (gastroesophageal reflux disease)   . HIV infection ([facility])   . Substance abuse [facility](HCC)     Past Surgical History:  Procedure Laterality Date  . TOOTH EXTRACTION      No family history on file.  Social History   Socioeconomic History  . Marital status: Single    Spouse name: Not on file  . Number of children: Not on file  . Years of education: Not on file  . Highest education level: Not on file  Occupational History  . Not on file  Social Needs  . Financial resource strain: Not on file  . Food insecurity    Worry: Not on file    Inability: Not on file  . Transportation needs    Medical: Not on file    Non-medical: Not on file  Tobacco Use  . Smoking status: Current Every Day Smoker    Packs/day: 0.40    Years: 17.00    Pack years: 6.80  . Smokeless tobacco: Never Used  Substance and Sexual Activity  . Alcohol use:  No    Comment: occassionally  . Drug use: Yes    Types: Marijuana  . Sexual activity: Not Currently    Partners: Male    Comment: pt. declined condoms  Lifestyle  . Physical activity    Days per week: Not on file    Minutes per session: Not on file  . Stress: Not on file  Relationships  . Social Musicianconnections    Talks on phone: Not on file    Gets together: Not on file    Attends religious service: Not on file    Active member of club or organization: Not on file    Attends meetings of clubs or organizations: Not on file    Relationship status: Not on file  . Intimate partner violence    Fear of current or ex partner: Not on file    Emotionally abused: Not on file    Physically abused: Not on file    Forced sexual activity: Not on file  Other Topics Concern  . Not on file  Social History Narrative  . Not on file     Physical Exam  Vital Signs and Nursing Notes reviewed Vitals:   07/22/19 1430  07/22/19 1500  BP: 99/70 110/74  Pulse: (!) 52 (!) 53  Resp: 20 (!) 24  Temp:    SpO2: 96% (!) 87%    CONSTITUTIONAL: Mild-appearing, NAD NEURO:  Alert and oriented x 3, no focal deficits EYES:  eyes equal and reactive ENT/NECK:  no LAD, no JVD CARDIO: Regular rate, well-perfused, normal S1 and S2 PULM:  CTAB no wheezing or rhonchi GI/GU:  normal bowel sounds, non-distended, non-tender MSK/SPINE:  No gross deformities, no edema SKIN:  no rash, atraumatic PSYCH:  Appropriate speech and behavior  Diagnostic and Interventional Summary    EKG Interpretation  Date/Time:  Monday July 22 2019 09:51:33 EDT Ventricular Rate:  89 PR Interval:    QRS Duration: 111 QT Interval:  359 QTC Calculation: 437 R Axis:   89 Text Interpretation:  Sinus rhythm Biatrial enlargement Left ventricular hypertrophy ST elev, probable normal early repol pattern Confirmed by Kennis Carina [phone]) on 07/22/2019 11:09:13 AM      Labs Reviewed  BASIC METABOLIC PANEL - Abnormal; Notable for the  following components:      Result Value   Sodium 131 (*)    All other components within normal limits  CBC - Abnormal; Notable for the following components:   WBC 3.2 (*)    All other components within normal limits  URINALYSIS, ROUTINE W REFLEX MICROSCOPIC - Abnormal; Notable for the following components:   Protein, ur 30 (*)    All other components within normal limits  T-HELPER CELLS (CD4) COUNT (NOT AT [facility])  CBG MONITORING, ED    DG Chest Port 1 View  Final Result      Medications  sodium chloride 0.9 % bolus 1,000 mL (0 mLs Intravenous Stopped 07/22/19 1501)     Procedures  /  Critical Care  ED Course and Medical Decision Making  I have reviewed the triage vital signs and the nursing notes.  Pertinent labs & imaging results that were available during my care of the patient were reviewed by me and considered in my medical decision making (see below for details).  Favoring dehydration given the poor p.o. intake.  Soft blood pressure here, no symptoms or signs of infection but will pursue infectious work-up given his HIV status and lack of HIV medications.  Blood pressure improving with fluids, patient asymptomatic during his entire ED stay.  Single episode of hypoxia documented but this is felt to be documented in EHR, no shortness of breath, satting 99% on all of my evaluations.  Nothing to suggest infection.  We have arranged follow-up with him with financial services as well as the infectious disease doctors.  Elmer Sow. Pilar Plate, MD [facility] Emergency Medicine [facility] [email or web address]  Final Clinical Impressions(s) / ED Diagnoses     ICD-10-CM   1. Dehydration  E86.0   2. Dizzy  R42 DG Chest Port 1 View    DG Chest Port 1 View    ED Discharge Orders    None      Discharge Instructions Discussed with and Provided to Patient:   Discharge Instructions     You were evaluated in the Emergency Department and after careful evaluation, we did  not find any emergent condition requiring admission or further testing in the hospital.  Your exam/testing today is overall reassuring.  Continue to drink fluids at home and follow-up with the arranged appointments.  Please return to the Emergency Department if you experience any worsening of your condition.  We encourage you to follow  up with a primary care provider.  Thank you for allowing [place] to be a part of your care.         Maudie Flakes, MD 07/22/19 1540

## 2019-07-22 NOTE — TOC Initial Note (Addendum)
Transition of Care [facility]) - Initial/Assessment Note    Patient Details  Name: Arthur [name] MRN: [id number] Date of Birth: 04/19/1981  Transition of [facility] CM/SW Contact:    Arthur Rasher, RN Phone Number:  [phone]  07/22/2019, 1:23 PM  Clinical Narrative:                 Spoke to pt and states he was going to infectious disease but has not been in over a year. He is currently not working. Does not have insurance or PCP. Pt states he had started the ADAP application to get assistance with HIV meds.  But could not produce information on his wages or income for the program.  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  Scheduled appt for Financial/Labs on 10/272020 at 10 am and Dr Arthur [name] on 08/07/2019 at 11 am.   Expected Discharge Plan: Home/Self Care Barriers to Discharge: No Barriers Identified   Patient Goals and CMS Choice        Expected Discharge Plan and Services Expected Discharge Plan: Home/Self Care   Discharge Planning Services: CM Consult, Follow-up appt scheduled   Living arrangements for the past 2 months: Apartment                                      Prior Living Arrangements/Services Living arrangements for the past 2 months: Apartment Lives with:: Siblings Patient language and need for interpreter reviewed:: Yes Do you feel safe going back to the place where you live?: Yes      Need for Family Participation in Patient Care: No (Comment) Care giver support system in place?: No (comment)   Criminal Activity/Legal Involvement Pertinent to Current Situation/Hospitalization: No - Comment as needed  Activities of Daily Living      Permission Sought/Granted Permission sought to share information with : Case Manager, PCP, Other (comment) Permission granted to share information with :  Yes, Verbal Permission Granted  Share Information with NAME: Arthur [name]  Permission granted to share info w AGENCY: Infectious Disease  Permission granted to share info w Relationship: mother  Permission granted to share info w Contact Information: [phone]  Emotional Assessment Appearance:: Appears stated age Attitude/Demeanor/Rapport: Engaged Affect (typically observed): Accepting Orientation: : Oriented to Place, Oriented to  Time, Oriented to Self, Oriented to Situation Alcohol / Substance Use: Tobacco Use Psych Involvement: No (comment)  Admission diagnosis:  near syncope  Patient Active Problem List   Diagnosis Date Noted  . Nausea and vomiting 03/25/2013  . Muscle spasm 03/25/2013  . Drug use 01/11/2012  . 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 COUGH 06/23/2009  . CHEST PAIN, PLEURITIC 06/23/2009  . FUNGAL DERMATITIS 05/28/2008  . GERD 05/28/2008  . DENTAL CARIES 02/13/2008  . NICOTINE ADDICTION 11/14/2007  . BACK STRAIN, LUMBAR 08/22/2007  . 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 WEIGHT LOSS, RECENT 05/01/2007  . ANXIETY DEPRESSION 02/14/2007  . HIV DISEASE 02/13/2007  . CERVICAL LYMPHADENOPATHY, ANTERIOR, RIGHT 02/13/2007   PCP:  Arthur [name], Arthur Islam, MD Pharmacy:   [facility] [place], [address] AT [place] [address] Phone: [phone] Fax: [place] #  [id number] Ginette Otto, [place] - [address] AT [facility] OF [address] [place] [address] Phone: [phone] Fax: [phone]  [facility] [address] [place] - [address] [place] [address] Phone: [phone] Fax: [phone]     Social Determinants of Health (SDOH) Interventions    Readmission Risk Interventions No flowsheet data found.

## 2019-07-22 NOTE — ED Notes (Signed)
Pt verbalizes understanding of DC instructions. Pt belongings returned and is ambulatory out of ED.  

## 2019-07-23 ENCOUNTER — Encounter: Payer: Self-pay | Admitting: Infectious Diseases

## 2019-07-23 ENCOUNTER — Ambulatory Visit: Payer: Self-pay

## 2019-07-23 ENCOUNTER — Other Ambulatory Visit: Payer: Self-pay

## 2019-07-23 DIAGNOSIS — Z113 Encounter for screening for infections with a predominantly sexual mode of transmission: Secondary | ICD-10-CM

## 2019-07-23 DIAGNOSIS — B20 Human immunodeficiency virus [HIV] disease: Secondary | ICD-10-CM

## 2019-07-23 LAB — T-HELPER CELLS (CD4) COUNT (NOT AT ARMC)
CD4 % Helper T Cell: 3 % — ABNORMAL LOW (ref 33–65)
CD4 T Cell Abs: 35 /uL — ABNORMAL LOW (ref 400–1790)

## 2019-07-24 LAB — FLUORESCENT TREPONEMAL AB(FTA)-IGG-BLD: Fluorescent Treponemal ABS: REACTIVE — AB

## 2019-07-24 LAB — RPR: RPR Ser Ql: REACTIVE — AB

## 2019-07-24 LAB — RPR TITER: RPR Titer: 1:128 {titer} — ABNORMAL HIGH

## 2019-07-26 ENCOUNTER — Encounter (HOSPITAL_COMMUNITY): Payer: Self-pay | Admitting: Emergency Medicine

## 2019-07-26 ENCOUNTER — Emergency Department (HOSPITAL_COMMUNITY)
Admission: EM | Admit: 2019-07-26 | Discharge: 2019-07-26 | Disposition: A | Discharge status: Home / Self Care | Payer: Self-pay | Attending: Emergency Medicine | Admitting: Emergency Medicine

## 2019-07-26 ENCOUNTER — Other Ambulatory Visit: Payer: Self-pay

## 2019-07-26 DIAGNOSIS — D72819 Decreased white blood cell count, unspecified: Secondary | ICD-10-CM | POA: Insufficient documentation

## 2019-07-26 DIAGNOSIS — F1210 Cannabis abuse, uncomplicated: Secondary | ICD-10-CM | POA: Insufficient documentation

## 2019-07-26 DIAGNOSIS — B20 Human immunodeficiency virus [HIV] disease: Secondary | ICD-10-CM | POA: Insufficient documentation

## 2019-07-26 DIAGNOSIS — A539 Syphilis, unspecified: Secondary | ICD-10-CM | POA: Insufficient documentation

## 2019-07-26 DIAGNOSIS — F17210 Nicotine dependence, cigarettes, uncomplicated: Secondary | ICD-10-CM | POA: Insufficient documentation

## 2019-07-26 DIAGNOSIS — Z79899 Other long term (current) drug therapy: Secondary | ICD-10-CM | POA: Insufficient documentation

## 2019-07-26 DIAGNOSIS — R42 Dizziness and giddiness: Secondary | ICD-10-CM | POA: Insufficient documentation

## 2019-07-26 LAB — COMPREHENSIVE METABOLIC PANEL
ALT: 17 U/L (ref 0–44)
AST: 30 U/L (ref 15–41)
Albumin: 3.5 g/dL (ref 3.5–5.0)
Alkaline Phosphatase: 61 U/L (ref 38–126)
Anion gap: 10 (ref 5–15)
BUN: 18 mg/dL (ref 6–20)
CO2: 26 mmol/L (ref 22–32)
Calcium: 9.3 mg/dL (ref 8.9–10.3)
Chloride: 97 mmol/L — ABNORMAL LOW (ref 98–111)
Creatinine, Ser: 1.13 mg/dL (ref 0.61–1.24)
GFR calc Af Amer: >60 mL/min (ref >60)
GFR calc non Af Amer: >60 mL/min (ref >60)
Glucose, Bld: 95 mg/dL (ref 70–99)
Potassium: 4.2 mmol/L (ref 3.5–5.1)
Sodium: 133 mmol/L — ABNORMAL LOW (ref 135–145)
Total Bilirubin: 0.8 mg/dL (ref 0.3–1.2)
Total Protein: 10 g/dL — ABNORMAL HIGH (ref 6.5–8.1)

## 2019-07-26 LAB — URINALYSIS, ROUTINE W REFLEX MICROSCOPIC
Bilirubin Urine: NEGATIVE
Glucose, UA: NEGATIVE mg/dL
Ketones, ur: 20 mg/dL — AB
Leukocytes,Ua: NEGATIVE
Nitrite: NEGATIVE
Protein, ur: 30 mg/dL — AB
Specific Gravity, Urine: 1.029 (ref 1.005–1.030)
pH: 6 (ref 5.0–8.0)

## 2019-07-26 LAB — CBC
HCT: 46.2 % (ref 39.0–52.0)
Hemoglobin: 15.1 g/dL (ref 13.0–17.0)
MCH: 29.8 pg (ref 26.0–34.0)
MCHC: 32.7 g/dL (ref 30.0–36.0)
MCV: 91.1 fL (ref 80.0–100.0)
Platelets: 299 10*3/uL (ref 150–400)
RBC: 5.07 MIL/uL (ref 4.22–5.81)
RDW: 13.4 % (ref 11.5–15.5)
WBC: 2.4 10*3/uL — ABNORMAL LOW (ref 4.0–10.5)
nRBC: 0 % (ref 0.0–0.2)

## 2019-07-26 MED ORDER — PENICILLIN G BENZATHINE 1200000 UNIT/2ML IM SUSP
2.4000 10*6.[IU] | Freq: Once | INTRAMUSCULAR | Status: AC
  Administered 2019-07-26: 2.4 10*6.[IU] via INTRAMUSCULAR
  Filled 2019-07-26: qty 4

## 2019-07-26 MED ORDER — ONDANSETRON 8 MG PO TBDP: 8.0000 mg | ORAL_TABLET | Freq: Three times a day (TID) | ORAL | 0 refills | Status: DC | PRN

## 2019-07-26 MED ORDER — SODIUM CHLORIDE 0.9 % IV BOLUS
1000.0000 mL | Freq: Once | INTRAVENOUS | Status: AC
  Administered 2019-07-26: 1000 mL via INTRAVENOUS

## 2019-07-26 NOTE — ED Notes (Signed)
Urine culture sent to lab with urine sample.  

## 2019-07-26 NOTE — ED Triage Notes (Signed)
Pt reports feeling dizzy and fatigued since he was here on 10/26. C/o right eye being blurry that started today. reports been vomiting after he eats.

## 2019-07-26 NOTE — ED Provider Notes (Signed)
[facility]-EMERGENCY DEPT Provider Note   CSN: [id number] Arrival date & time: 07/26/19  [id number]     History   Chief Complaint Chief Complaint  Patient presents with  . Dizziness  . Blurred Vision    HPI Albertina ParrMichael J Lingard is a 38 y.o. male.     HPI  38 year old male history of HIV presents today complaining of nausea and vomiting.  He was seen here earlier this week due to dizziness and lightheadedness that was thought to be due to low blood pressure.  He was given IV fluids and symptoms resolved.  He states that since that time he has had vomiting with solid food intake.  He states he has not had any associated pain.  He has been tolerating liquids.  He has been back in contact with his infectious disease doctors and has an appointment to follow-up next week.  He had some labs drawn in the interim.  He endorses that he has had some intermittent headaches but is not currently having a headache.  He denies any cough, fever, shortness of breath, chest pain, abdominal pain, weight loss or weight gain.  He denies having any skin lesions or rash.  He is a smoker but is attempting to stop smoking.  He denies alcohol or illicit drugs the exception of marijuana.  Past Medical History:  Diagnosis Date  . Anemia   . Anxiety   . Depression   . GERD (gastroesophageal reflux disease)   . HIV infection (HCC)   . Substance abuse [facility](HCC)     Patient Active Problem List   Diagnosis Date Noted  . Nausea and vomiting 03/25/2013  . Muscle spasm 03/25/2013  . Drug use 01/11/2012  . 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 COUGH 06/23/2009  . CHEST PAIN, PLEURITIC 06/23/2009  . FUNGAL DERMATITIS 05/28/2008  . GERD 05/28/2008  . DENTAL CARIES 02/13/2008  . NICOTINE ADDICTION 11/14/2007  . BACK STRAIN, LUMBAR 08/22/2007  . 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 WEIGHT LOSS, RECENT 05/01/2007  . ANXIETY DEPRESSION 02/14/2007  . HIV DISEASE 02/13/2007  . CERVICAL LYMPHADENOPATHY, ANTERIOR,  RIGHT 02/13/2007    Past Surgical History:  Procedure Laterality Date  . TOOTH EXTRACTION          Home Medications    Prior to Admission medications   Medication Sig Start Date End Date Taking? Authorizing Provider  cephALEXin (KEFLEX) 500 MG capsule Take 1 capsule (500 mg total) by mouth 4 (four) times daily. Patient not taking: Reported on 01/22/2018 03/15/16   Audry PiliMohr, Tyler, PA-C  citalopram (CELEXA) 40 MG tablet Take 1 tablet (40 mg total) by mouth daily. Patient not taking: Reported on 01/22/2018 02/06/14   Daiva EvesVan Dam, Lisette Grinderornelius N, MD  cyclobenzaprine (FLEXERIL) 10 MG tablet Take 1 tablet (10 mg total) by mouth 3 (three) times daily as needed for muscle spasms. Patient not taking: Reported on 01/22/2018 09/03/13   Daiva EvesVan Dam, Lisette Grinderornelius N, MD  Darunavir Ethanolate (PREZISTA) 800 MG tablet Take 1 tablet (800 mg total) by mouth daily. 02/06/14   Randall HissVan Dam, Cornelius N, MD  HYDROcodone-acetaminophen [facility](NORCO) 5-325 MG tablet Take 1-2 tablets by mouth every 6 (six) hours as needed. Patient not taking: Reported on 01/22/2018 03/04/16   Geoffery Lyonselo, Douglas, MD  ibuprofen (ADVIL,MOTRIN) 800 MG tablet Take 1 tablet (800 mg total) by mouth 3 (three) times daily. Patient not taking: Reported on 01/22/2018 01/22/16   Garlon HatchetSanders, Lisa M, PA-C  lidocaine (XYLOCAINE) 2 % solution Use as directed 15 mLs in the mouth  or throat as needed for mouth pain. 04/15/19   Caccavale, Sophia, PA-C  oxyCODONE-acetaminophen (PERCOCET/ROXICET) 5-325 MG tablet Take 1 tablet by mouth every 6 (six) hours as needed for severe pain. 04/15/19   Caccavale, Sophia, PA-C  ritonavir (NORVIR) 100 MG TABS tablet Take 1 tablet (100 mg total) by mouth daily. Patient not taking: Reported on 01/22/2018 02/06/14   [name], [name], MD  TRUVADA 200-300 MG per tablet TAKE 1 TABLET BY MOUTH DAILY Patient not taking: Reported on 01/22/2018 05/12/14   [name], [name], MD    Family History No family history on file.  Social History Social History    Tobacco Use  . Smoking status: Current Every Day Smoker    Packs/day: 0.40    Years: 17.00    Pack years: 6.80  . Smokeless tobacco: Never Used  Substance Use Topics  . Alcohol use: No    Comment: occassionally  . Drug use: Yes    Types: Marijuana     Allergies   Patient has no known allergies.   Review of Systems Review of Systems  All other systems reviewed and are negative.    Physical Exam Updated Vital Signs BP 106/77   Pulse 70   Resp 18   SpO2 98%   Physical Exam Vitals signs and nursing note reviewed.  Constitutional:      General: He is not in acute distress.    Appearance: Normal appearance. He is not ill-appearing, toxic-appearing or diaphoretic.  HENT:     Head: Normocephalic and atraumatic.     Right Ear: External ear normal.     Left Ear: External ear normal.     Nose: Nose normal.     Mouth/Throat:     Mouth: Mucous membranes are moist.     Pharynx: Oropharynx is clear.  Eyes:     Extraocular Movements: Extraocular movements intact.     Pupils: Pupils are equal, round, and reactive to light.  Neck:     Musculoskeletal: Normal range of motion and neck supple.  Cardiovascular:     Rate and Rhythm: Normal rate and regular rhythm.     Pulses: Normal pulses.  Pulmonary:     Effort: Pulmonary effort is normal.     Breath sounds: Normal breath sounds.  Abdominal:     General: Abdomen is flat. Bowel sounds are normal. There is no distension.     Palpations: Abdomen is soft. There is no mass.     Tenderness: There is no abdominal tenderness.  Musculoskeletal: Normal range of motion.  Skin:    General: Skin is warm and dry.     Capillary Refill: Capillary refill takes less than 2 seconds.  Neurological:     General: No focal deficit present.     Mental Status: He is alert.  Psychiatric:        Mood and Affect: Mood normal.      ED Treatments / Results  Labs (all labs ordered are listed, but only abnormal results are displayed) Labs  Reviewed  CBC  COMPREHENSIVE METABOLIC PANEL  URINALYSIS, ROUTINE W REFLEX MICROSCOPIC    EKG None  Radiology No results found.  Procedures Procedures (including critical care time)  Medications Ordered in ED Medications  sodium chloride 0.9 % bolus 1,000 mL (has no administration in time range)  penicillin g benzathine (BICILLIN LA) 1200000 UNIT/2ML injection 2.4 Million Units (has no administration in time range)     Initial Impression / Assessment and Plan / ED  Course  I have reviewed the triage vital signs and the nursing notes.  Pertinent labs & imaging results that were available during my care of the patient were reviewed by me and considered in my medical decision making (see chart for details).       Reviewed labs.  RPR from the 27th noted positive.  Discussed with Dr. Johnnye Sima.  Will give 2,400,000 units of penicillin.  He will need a second [facility].  He has an appointment there on the 11th.  Final Clinical Impressions(s) / ED Diagnoses   Final diagnoses:  HIV infection, unspecified symptom status ([place])  Leukopenia, unspecified type  Syphilis    ED Discharge Orders         Ordered    ondansetron (ZOFRAN ODT) 8 MG disintegrating tablet  Every 8 hours PRN     07/26/19 1309           Pattricia Boss, MD 07/26/19 1348

## 2019-07-26 NOTE — Discharge Instructions (Signed)
Use zofran as needed for nausea Drink plenty of fluids Follow  up with infectious disease doctor  scheduled Return if worse at any time

## 2019-07-28 ENCOUNTER — Emergency Department (HOSPITAL_COMMUNITY): Payer: Medicaid Other

## 2019-07-28 ENCOUNTER — Inpatient Hospital Stay (HOSPITAL_COMMUNITY)
Admission: EM | Admit: 2019-07-28 | Discharge: 2019-09-07 | DRG: 974 | Disposition: A | Discharge status: Rehabilitation Facility | Payer: Medicaid Other | Attending: Internal Medicine | Admitting: Internal Medicine

## 2019-07-28 ENCOUNTER — Encounter (HOSPITAL_COMMUNITY): Payer: Self-pay | Admitting: Emergency Medicine

## 2019-07-28 ENCOUNTER — Other Ambulatory Visit: Payer: Self-pay

## 2019-07-28 DIAGNOSIS — D638 Anemia in other chronic diseases classified elsewhere: Secondary | ICD-10-CM | POA: Diagnosis present

## 2019-07-28 DIAGNOSIS — Z681 Body mass index (BMI) 19 or less, adult: Secondary | ICD-10-CM

## 2019-07-28 DIAGNOSIS — E870 Hyperosmolality and hypernatremia: Secondary | ICD-10-CM | POA: Diagnosis not present

## 2019-07-28 DIAGNOSIS — Z23 Encounter for immunization: Secondary | ICD-10-CM

## 2019-07-28 DIAGNOSIS — E222 Syndrome of inappropriate secretion of antidiuretic hormone: Secondary | ICD-10-CM | POA: Diagnosis not present

## 2019-07-28 DIAGNOSIS — R0602 Shortness of breath: Secondary | ICD-10-CM

## 2019-07-28 DIAGNOSIS — R4781 Slurred speech: Secondary | ICD-10-CM | POA: Diagnosis present

## 2019-07-28 DIAGNOSIS — B370 Candidal stomatitis: Secondary | ICD-10-CM | POA: Diagnosis present

## 2019-07-28 DIAGNOSIS — H5461 Unqualified visual loss, right eye, normal vision left eye: Secondary | ICD-10-CM | POA: Diagnosis present

## 2019-07-28 DIAGNOSIS — R9089 Other abnormal findings on diagnostic imaging of central nervous system: Secondary | ICD-10-CM

## 2019-07-28 DIAGNOSIS — F17210 Nicotine dependence, cigarettes, uncomplicated: Secondary | ICD-10-CM | POA: Diagnosis present

## 2019-07-28 DIAGNOSIS — R471 Dysarthria and anarthria: Secondary | ICD-10-CM | POA: Diagnosis present

## 2019-07-28 DIAGNOSIS — R1310 Dysphagia, unspecified: Secondary | ICD-10-CM

## 2019-07-28 DIAGNOSIS — Z4659 Encounter for fitting and adjustment of other gastrointestinal appliance and device: Secondary | ICD-10-CM

## 2019-07-28 DIAGNOSIS — R531 Weakness: Secondary | ICD-10-CM

## 2019-07-28 DIAGNOSIS — R112 Nausea with vomiting, unspecified: Secondary | ICD-10-CM | POA: Diagnosis present

## 2019-07-28 DIAGNOSIS — E871 Hypo-osmolality and hyponatremia: Secondary | ICD-10-CM | POA: Diagnosis present

## 2019-07-28 DIAGNOSIS — B3781 Candidal esophagitis: Secondary | ICD-10-CM | POA: Diagnosis present

## 2019-07-28 DIAGNOSIS — K219 Gastro-esophageal reflux disease without esophagitis: Secondary | ICD-10-CM | POA: Diagnosis present

## 2019-07-28 DIAGNOSIS — R627 Adult failure to thrive: Secondary | ICD-10-CM | POA: Diagnosis present

## 2019-07-28 DIAGNOSIS — R64 Cachexia: Secondary | ICD-10-CM | POA: Diagnosis present

## 2019-07-28 DIAGNOSIS — R0600 Dyspnea, unspecified: Secondary | ICD-10-CM

## 2019-07-28 DIAGNOSIS — Z515 Encounter for palliative care: Secondary | ICD-10-CM

## 2019-07-28 DIAGNOSIS — B582 Toxoplasma meningoencephalitis: Secondary | ICD-10-CM

## 2019-07-28 DIAGNOSIS — E162 Hypoglycemia, unspecified: Secondary | ICD-10-CM | POA: Diagnosis not present

## 2019-07-28 DIAGNOSIS — Z79899 Other long term (current) drug therapy: Secondary | ICD-10-CM

## 2019-07-28 DIAGNOSIS — Z789 Other specified health status: Secondary | ICD-10-CM

## 2019-07-28 DIAGNOSIS — H30891 Other chorioretinal inflammations, right eye: Secondary | ICD-10-CM | POA: Diagnosis present

## 2019-07-28 DIAGNOSIS — J9601 Acute respiratory failure with hypoxia: Secondary | ICD-10-CM | POA: Diagnosis not present

## 2019-07-28 DIAGNOSIS — Z20828 Contact with and (suspected) exposure to other viral communicable diseases: Secondary | ICD-10-CM | POA: Diagnosis present

## 2019-07-28 DIAGNOSIS — R1312 Dysphagia, oropharyngeal phase: Secondary | ICD-10-CM

## 2019-07-28 DIAGNOSIS — Z9119 Patient's noncompliance with other medical treatment and regimen: Secondary | ICD-10-CM

## 2019-07-28 DIAGNOSIS — H30899 Other chorioretinal inflammations, unspecified eye: Secondary | ICD-10-CM | POA: Diagnosis present

## 2019-07-28 DIAGNOSIS — B20 Human immunodeficiency virus [HIV] disease: Principal | ICD-10-CM | POA: Diagnosis present

## 2019-07-28 DIAGNOSIS — T17908A Unspecified foreign body in respiratory tract, part unspecified causing other injury, initial encounter: Secondary | ICD-10-CM

## 2019-07-28 DIAGNOSIS — R001 Bradycardia, unspecified: Secondary | ICD-10-CM | POA: Diagnosis not present

## 2019-07-28 DIAGNOSIS — Z7189 Other specified counseling: Secondary | ICD-10-CM

## 2019-07-28 DIAGNOSIS — B589 Toxoplasmosis, unspecified: Secondary | ICD-10-CM | POA: Diagnosis present

## 2019-07-28 DIAGNOSIS — G92 Toxic encephalopathy: Secondary | ICD-10-CM | POA: Diagnosis present

## 2019-07-28 DIAGNOSIS — I959 Hypotension, unspecified: Secondary | ICD-10-CM | POA: Diagnosis present

## 2019-07-28 DIAGNOSIS — B258 Other cytomegaloviral diseases: Secondary | ICD-10-CM | POA: Diagnosis present

## 2019-07-28 DIAGNOSIS — E861 Hypovolemia: Secondary | ICD-10-CM | POA: Diagnosis not present

## 2019-07-28 DIAGNOSIS — R42 Dizziness and giddiness: Secondary | ICD-10-CM

## 2019-07-28 DIAGNOSIS — N179 Acute kidney failure, unspecified: Secondary | ICD-10-CM | POA: Diagnosis present

## 2019-07-28 DIAGNOSIS — E43 Unspecified severe protein-calorie malnutrition: Secondary | ICD-10-CM | POA: Diagnosis present

## 2019-07-28 DIAGNOSIS — A523 Neurosyphilis, unspecified: Secondary | ICD-10-CM | POA: Diagnosis present

## 2019-07-28 DIAGNOSIS — H547 Unspecified visual loss: Secondary | ICD-10-CM

## 2019-07-28 DIAGNOSIS — F419 Anxiety disorder, unspecified: Secondary | ICD-10-CM | POA: Diagnosis present

## 2019-07-28 DIAGNOSIS — F329 Major depressive disorder, single episode, unspecified: Secondary | ICD-10-CM | POA: Diagnosis present

## 2019-07-28 LAB — CBC
HCT: 46.3 % (ref 39.0–52.0)
Hemoglobin: 15.3 g/dL (ref 13.0–17.0)
MCH: 30 pg (ref 26.0–34.0)
MCHC: 33 g/dL (ref 30.0–36.0)
MCV: 90.8 fL (ref 80.0–100.0)
Platelets: 312 10*3/uL (ref 150–400)
RBC: 5.1 MIL/uL (ref 4.22–5.81)
RDW: 13.3 % (ref 11.5–15.5)
WBC: 2.5 10*3/uL — ABNORMAL LOW (ref 4.0–10.5)
nRBC: 0 % (ref 0.0–0.2)

## 2019-07-28 LAB — BASIC METABOLIC PANEL
Anion gap: 8 (ref 5–15)
BUN: 14 mg/dL (ref 6–20)
CO2: 25 mmol/L (ref 22–32)
Calcium: 9.5 mg/dL (ref 8.9–10.3)
Chloride: 99 mmol/L (ref 98–111)
Creatinine, Ser: 1.01 mg/dL (ref 0.61–1.24)
GFR calc Af Amer: >60 mL/min (ref >60)
GFR calc non Af Amer: >60 mL/min (ref >60)
Glucose, Bld: 89 mg/dL (ref 70–99)
Potassium: 4.3 mmol/L (ref 3.5–5.1)
Sodium: 132 mmol/L — ABNORMAL LOW (ref 135–145)

## 2019-07-28 LAB — CBC WITH DIFFERENTIAL/PLATELET
Abs Immature Granulocytes: 0.01 10*3/uL (ref 0.00–0.07)
Basophils Absolute: 0 10*3/uL (ref 0.0–0.1)
Basophils Relative: 1 %
Eosinophils Absolute: 0.1 10*3/uL (ref 0.0–0.5)
Eosinophils Relative: 6 %
HCT: 42.8 % (ref 39.0–52.0)
Hemoglobin: 14.2 g/dL (ref 13.0–17.0)
Immature Granulocytes: 1 %
Lymphocytes Relative: 35 %
Lymphs Abs: 0.7 10*3/uL (ref 0.7–4.0)
MCH: 30.1 pg (ref 26.0–34.0)
MCHC: 33.2 g/dL (ref 30.0–36.0)
MCV: 90.7 fL (ref 80.0–100.0)
Monocytes Absolute: 0.3 10*3/uL (ref 0.1–1.0)
Monocytes Relative: 16 %
Neutro Abs: 0.8 10*3/uL — ABNORMAL LOW (ref 1.7–7.7)
Neutrophils Relative %: 41 %
Platelets: 272 10*3/uL (ref 150–400)
RBC: 4.72 MIL/uL (ref 4.22–5.81)
RDW: 13.2 % (ref 11.5–15.5)
WBC: 1.9 10*3/uL — ABNORMAL LOW (ref 4.0–10.5)
nRBC: 0 % (ref 0.0–0.2)

## 2019-07-28 LAB — OSMOLALITY, URINE: Osmolality, Ur: 1024 mOsm/kg — ABNORMAL HIGH (ref 300–900)

## 2019-07-28 IMAGING — MR MR HEAD WO/W CM
13 of 15 series · 40 of 48 positions shown · IV contrast (gadavist)
Comparison: CT head earlier today.

CLINICAL DATA: Unexplained altered level of consciousness.
Generalized weakness with dizziness and decreased appetite for 2
weeks. HIV.

EXAM:
MRI HEAD WITHOUT AND WITH CONTRAST
TECHNIQUE: Multiplanar, multiecho pulse sequences of the brain and surrounding
structures were obtained without and with intravenous contrast.
CONTRAST:  6mL GADAVIST GADOBUTROL 1 MMOL/ML IV SOLN

[Series 5: DWI · axial · 3.0mm · 0.88mm/px · z∈[-132,+13]mm · 6 of 104 slices shown (1 of 4)]
[im 1/104]
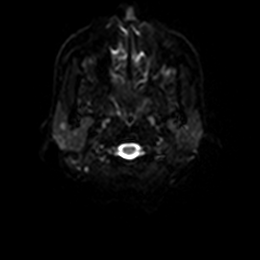
[im 21/104]
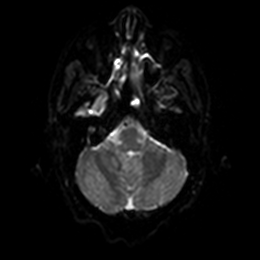
[im 42/104]
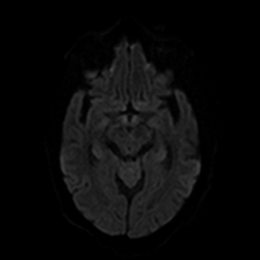
[im 62/104]
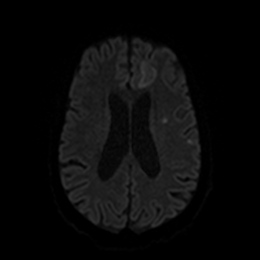
[im 83/104]
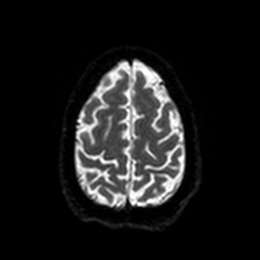
[im 104/104]
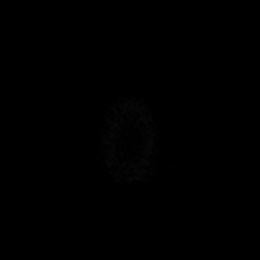

[Series 6: DWI · axial · 3.0mm · 0.88mm/px · z∈[-132,+13]mm · 3 of 52 slices shown (2 of 4)]
[im 1/52]
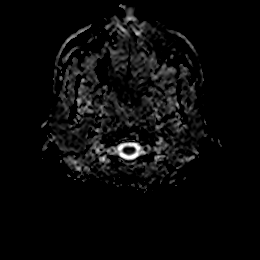
[im 26/52]
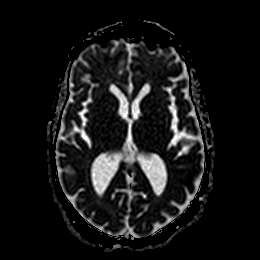
[im 52/52]
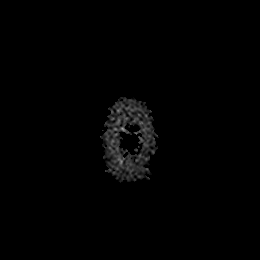

[Series 7: DWI · coronal · 4.0mm · 0.88mm/px · 5 of 76 slices shown (3 of 4)]
[im 1/76]
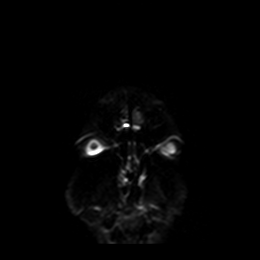
[im 19/76]
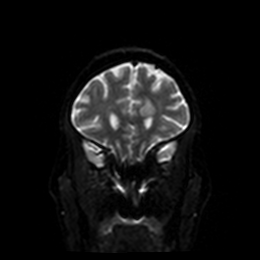
[im 38/76]
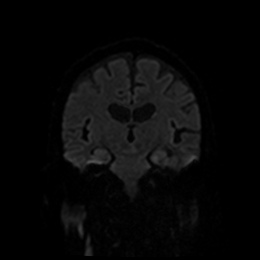
[im 57/76]
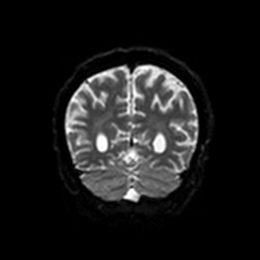
[im 76/76]
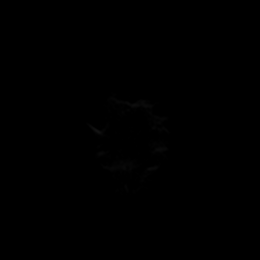

[Series 8: DWI · coronal · 4.0mm · 0.88mm/px · 2 of 38 slices shown (4 of 4)]
[im 1/38]
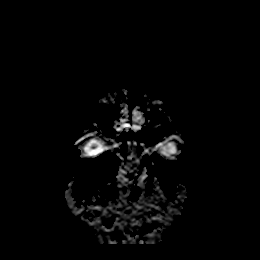
[im 38/38]
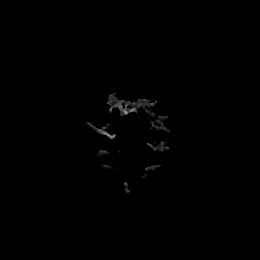

[Series 9: FLAIR · axial · 5.0mm · 0.45mm/px · z∈[-129,+7]mm · 2 of 25 slices shown]
[im 1/25]
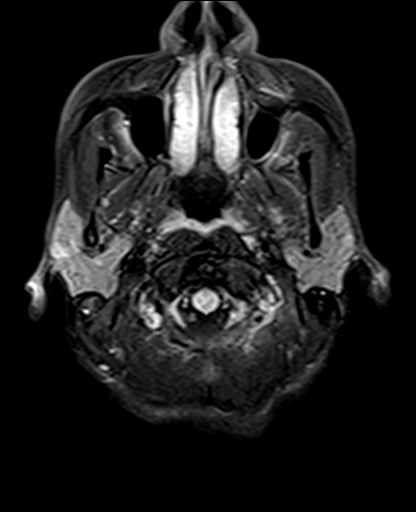
[im 25/25]
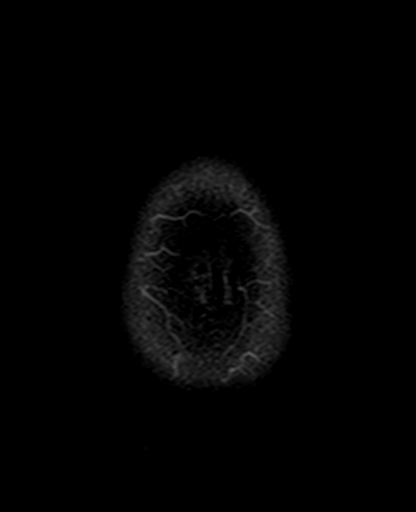

[Series 10: mag_images · axial · 3.0mm · 0.90mm/px · z∈[-145,+22]mm · 4 of 60 slices shown]
[im 1/60]
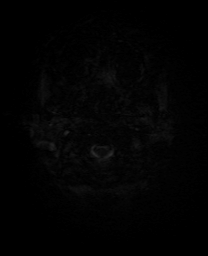
[im 20/60]
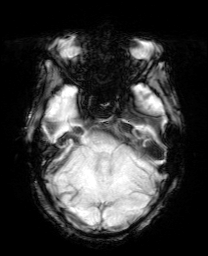
[im 40/60]
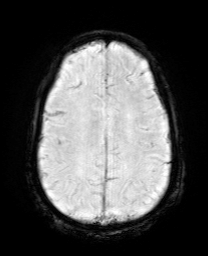
[im 60/60]
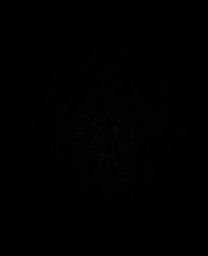

[Series 11: pha_images · axial · 3.0mm · 0.90mm/px · z∈[-145,+5]mm · 3 of 54 slices shown]
[im 1/54]
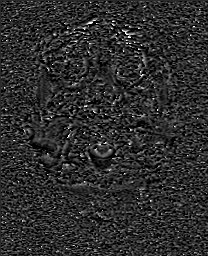
[im 27/54]
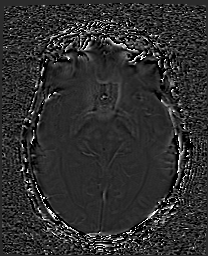
[im 54/54]
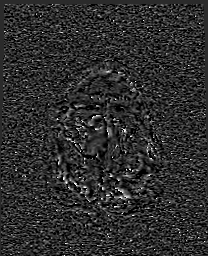

[Series 12: swi_images · axial · 3.0mm · 0.90mm/px · z∈[-145,+22]mm · 4 of 60 slices shown]
[im 1/60]
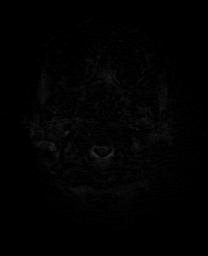
[im 20/60]
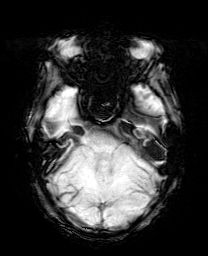
[im 40/60]
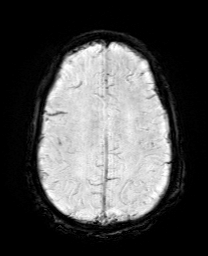
[im 60/60]
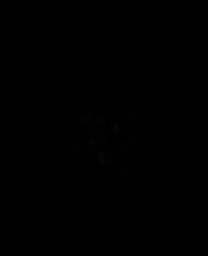

[Series 13: mip_images(sw) · axial · 24.0mm · 0.90mm/px · z∈[-135,+13]mm · 3 of 53 slices shown]
[im 1/53]
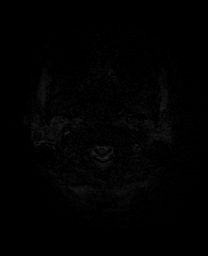
[im 27/53]
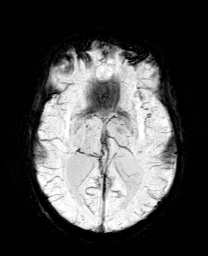
[im 53/53]
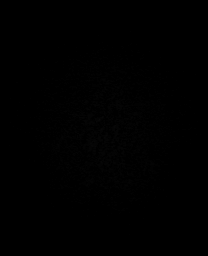

[Series 14: T1 · sagittal · 5.0mm · 0.75mm/px · 2 of 25 slices shown]
[im 1/25]
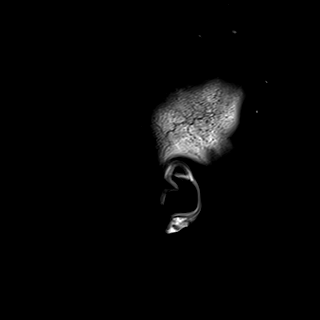
[im 25/25]
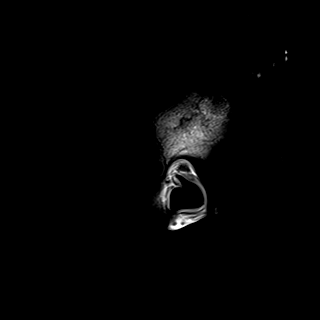

[Series 15: T2 · axial · 5.0mm · 0.72mm/px · z∈[-128,+9]mm · 2 of 25 slices shown]
[im 1/25]
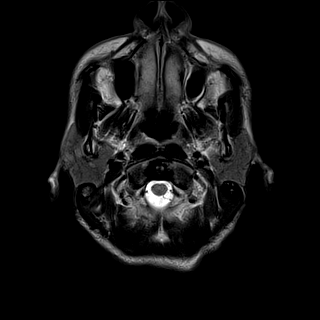
[im 25/25]
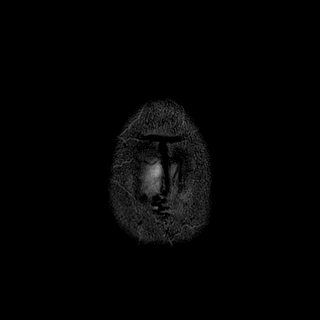

[Series 18: T1 post-contrast · coronal · 5.0mm · 0.34mm/px · 2 of 30 slices shown (1 of 2)]
[im 1/30]
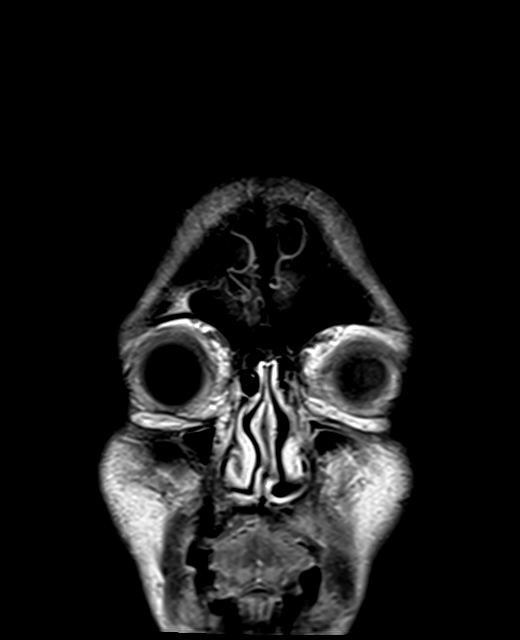
[im 30/30]
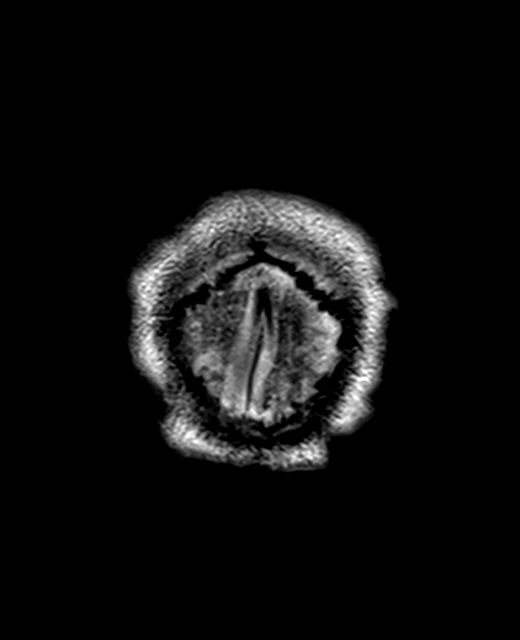

[Series 19: T1 post-contrast · sagittal · 5.0mm · 0.72mm/px · 2 of 25 slices shown (2 of 2)]
[im 1/25]
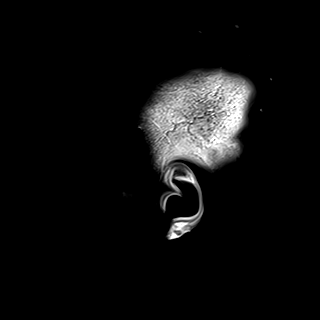
[im 25/25]
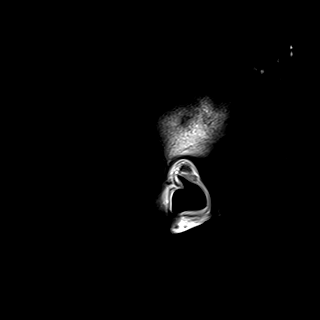

[40 of 48 positions shown; findings below may reference images not displayed]

FINDINGS: Brain: Widespread areas of abnormal low level restricted diffusion
are predominantly infratentorial, affecting the cerebellar cortex,
white matter, and leptomeningeal spaces including the prepontine
cistern. Other similar non confluent cortical and subcortical white
matter lesions are seen throughout both cerebral hemispheres with
the dominant abnormality in the LEFT anterior frontal white matter.
These lesions are largely T2 and FLAIR hyperintense and demonstrate
abnormal postcontrast enhancement. No midline shift.

Generalized atrophy. Minor foci of white matter disease elsewhere,
premature for age but not clearly acute.

Vascular: Normal flow voids.

Skull and upper cervical spine: Normal marrow signal.

Sinuses/Orbits: Negative.

Other: None.

Correlating with the patient's earlier CT, the areas of increased
attenuation in the cerebellum are favored to represent calcification
rather than hemorrhage.
IMPRESSION: 1. Widespread areas of abnormal low level restricted diffusion and
postcontrast enhancement throughout both cerebral hemispheres,
predominantly infratentorial, with the dominant abnormality in the
LEFT anterior frontal white matter. In this immunocompromised
patient, the findings are most consistent with an opportunistic
infection, such as toxoplasmosis, tuberculosis or fungal
cerebritis/meningitis, or parasitic infection. Infectious disease
consultation is warranted.
2. Generalized atrophy with mild small vessel disease elsewhere,
premature for age but not clearly acute.
3. No midline shift or acute hydrocephalus.

## 2019-07-28 IMAGING — CT CT HEAD W/O CM
3 of 4 series · 15 of 47 positions shown, 18 images · non-contrast
Comparison: CT, [DATE].

CLINICAL DATA: Generalized weakness and dizziness for the last 2
weeks. Denies pain or injury

EXAM:
CT HEAD WITHOUT CONTRAST
TECHNIQUE: Contiguous axial images were obtained from the base of the skull
through the vertex without intravenous contrast.

[Series 4: head 2.0 h70h · axial · 0.46mm/px · z∈[-91,+31]mm · 9 of 77 slices shown, 12 images]
[im 8/77  brain]
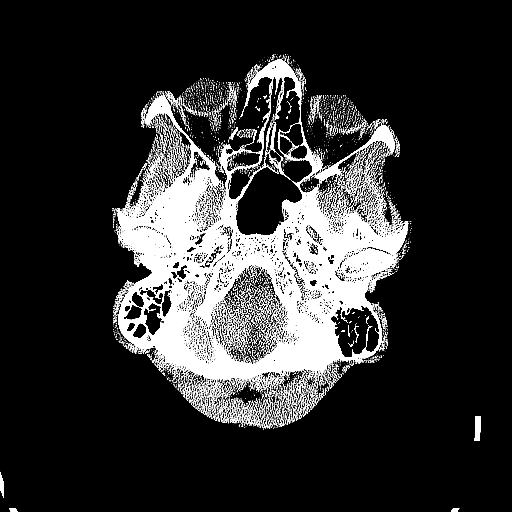
[im 8/77  bone]
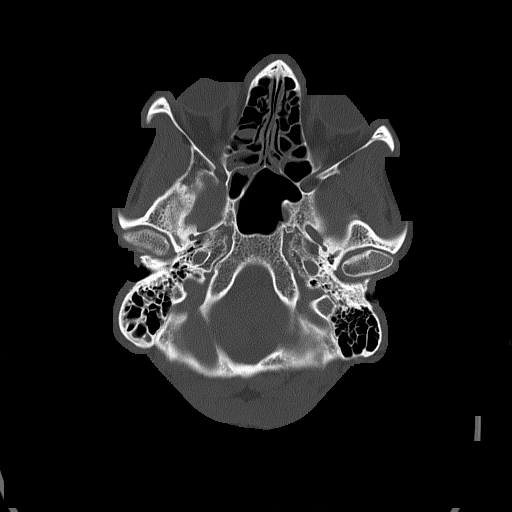
[im 16/77  brain]
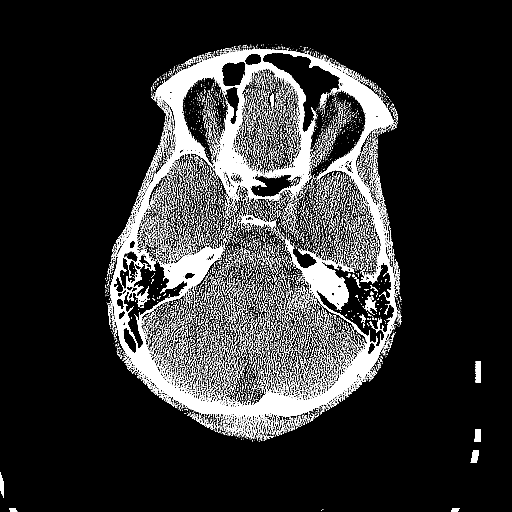
[im 23/77  brain]
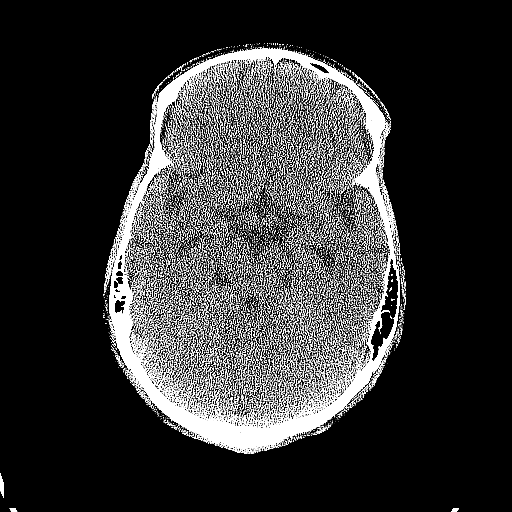
[im 31/77  brain]
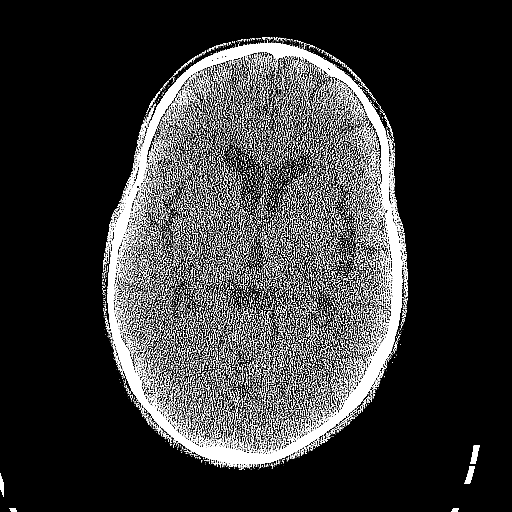
[im 39/77  brain]
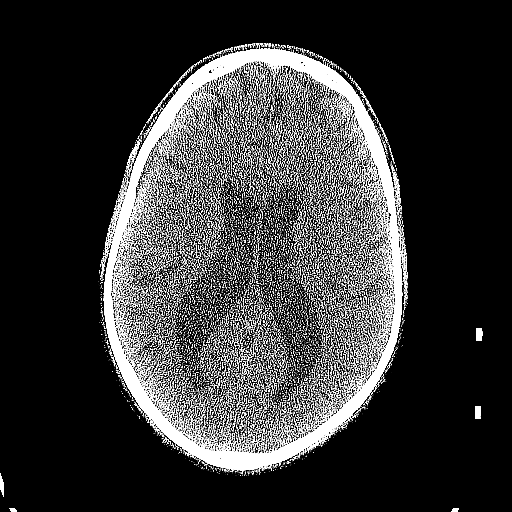
[im 39/77  bone]
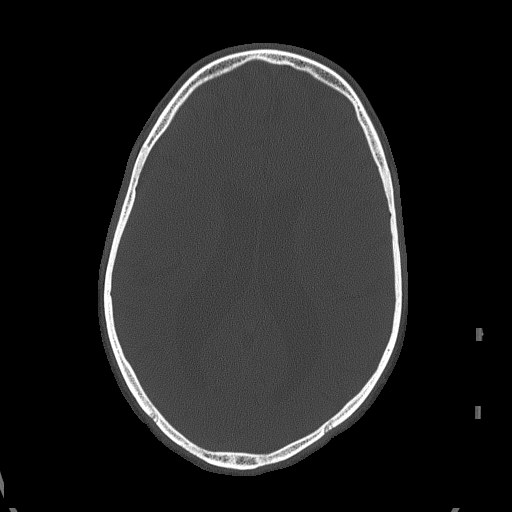
[im 46/77  brain]
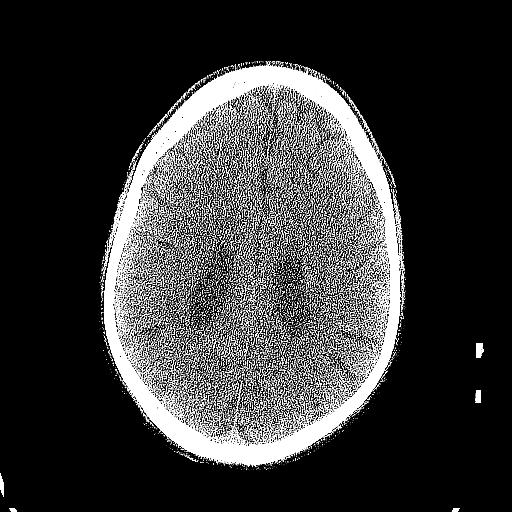
[im 54/77  brain]
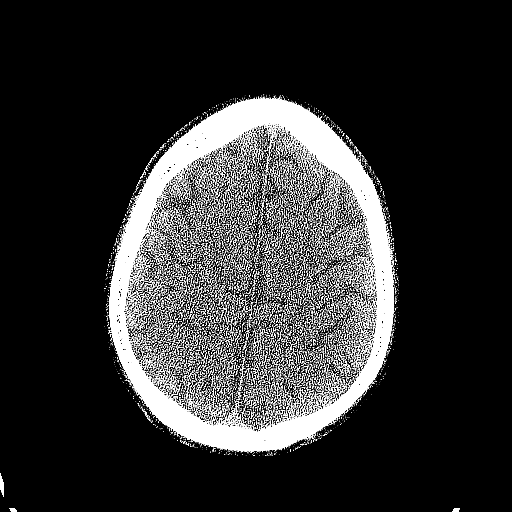
[im 61/77  brain]
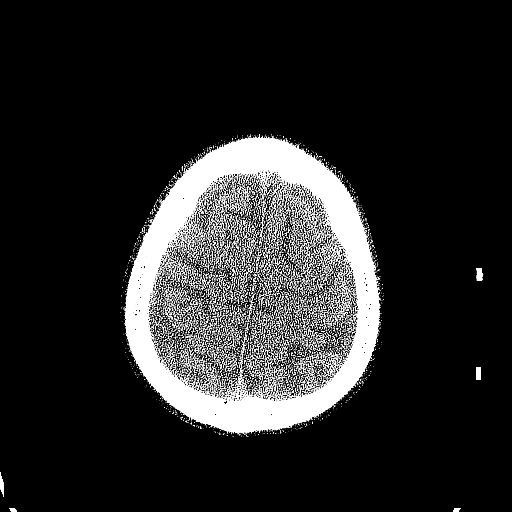
[im 69/77  brain]
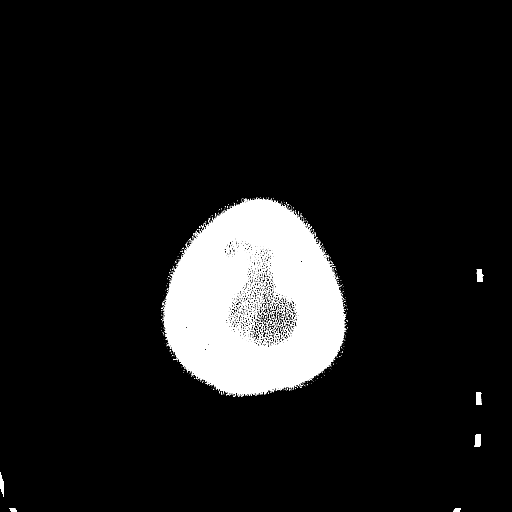
[im 69/77  bone]
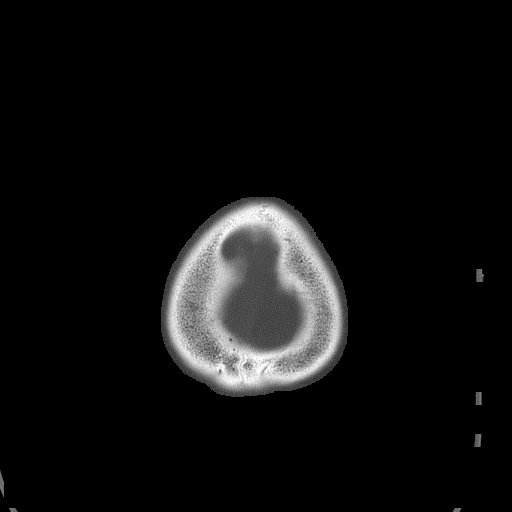

[Series 5: head 3.0 mpr cor · coronal · 0.31mm/px · 3 of 69 slices shown]
[im 23/69  brain]
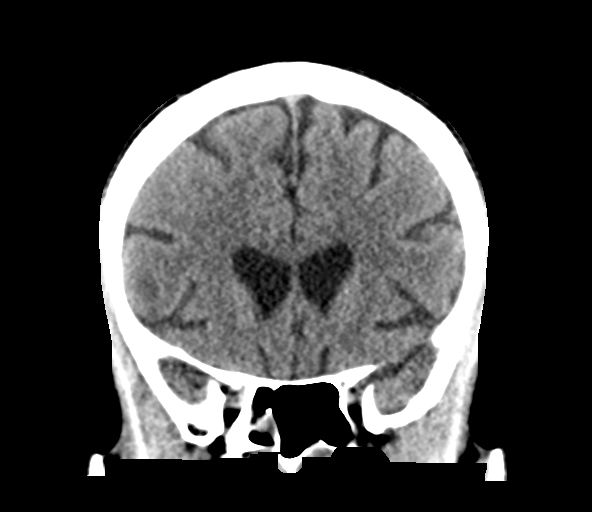
[im 31/69  brain]
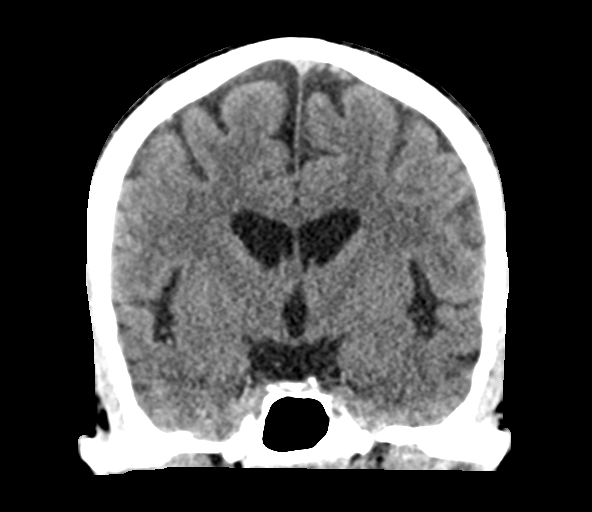
[im 38/69  brain]
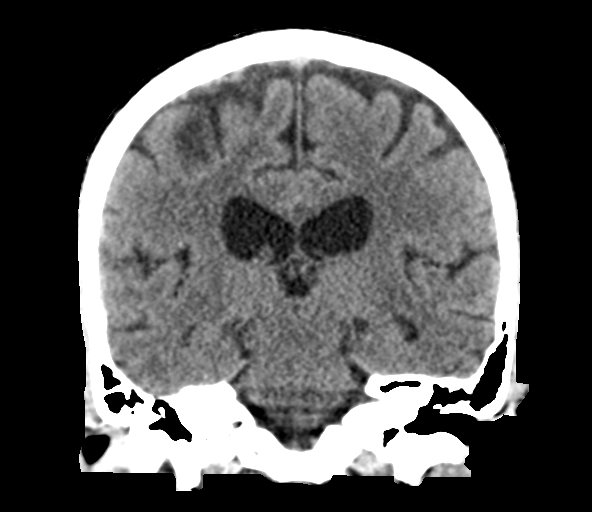

[Series 6: head 3.0 mpr sag · sagittal · 0.33mm/px · 3 of 54 slices shown]
[im 18/54  brain]
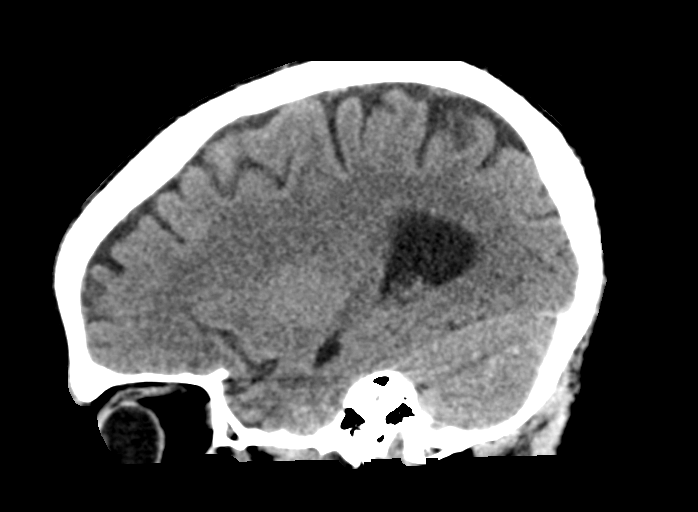
[im 27/54  brain]
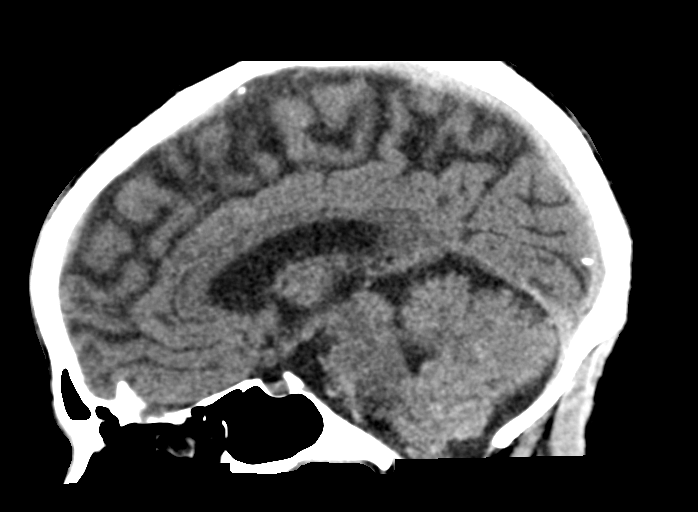
[im 36/54  brain]
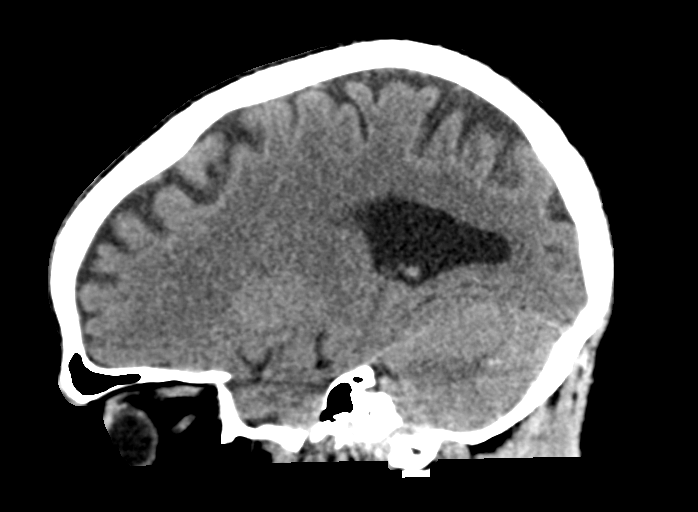

[15 of 47 positions shown; findings below may reference images not displayed]

FINDINGS: Brain: There are small hyperattenuating foci along the cerebellum,
new when compared to the prior CT. There is no defined mass and no
mass effect.

The ventricles are normal in configuration, but larger than expected
for this patient's age, and larger than on the prior CT. No
hydrocephalus.

No evidence of an infarct.  There is no intracranial hemorrhage.

Vascular: No hyperdense vessel or unexpected calcification.

Skull: Normal. Negative for fracture or focal lesion.

Sinuses/Orbits: Globes and orbits are unremarkable. Mild ethmoid and
sphenoid sinus mucosal thickening. Clear mastoid air cells.

Other: None.
IMPRESSION: 1. Small foci of increased attenuation within the cerebellum.
Possible etiologies include cirrhosis, infection and metastatic
disease.
2. Mild ventricular enlargement greater than expected for this
patient's age and increased in size compared to the prior CT.
3. No other intracranial abnormalities.
4. Recommend follow-up MRI of the brain without and with contrast
further assessment.

## 2019-07-28 MED ORDER — ONDANSETRON HCL 4 MG/2ML IJ SOLN
4.0000 mg | Freq: Four times a day (QID) | INTRAMUSCULAR | Status: DC | PRN
  Administered 2019-07-30 – 2019-09-06 (×7): 4 mg via INTRAVENOUS
  Filled 2019-07-28 (×7): qty 2

## 2019-07-28 MED ORDER — SODIUM CHLORIDE 0.9 % IV SOLN
INTRAVENOUS | Status: AC
  Administered 2019-07-29: via INTRAVENOUS

## 2019-07-28 MED ORDER — SODIUM CHLORIDE 0.9% FLUSH
3.0000 mL | Freq: Two times a day (BID) | INTRAVENOUS | Status: DC
  Administered 2019-07-29 – 2019-08-12 (×18): 3 mL via INTRAVENOUS

## 2019-07-28 MED ORDER — SODIUM CHLORIDE 0.9% FLUSH
3.0000 mL | INTRAVENOUS | Status: DC | PRN
  Administered 2019-07-31: 3 mL via INTRAVENOUS
  Filled 2019-07-28: qty 3

## 2019-07-28 MED ORDER — ACETAMINOPHEN 325 MG PO TABS
650.0000 mg | ORAL_TABLET | Freq: Four times a day (QID) | ORAL | Status: DC | PRN
  Administered 2019-08-27 – 2019-09-02 (×2): 650 mg via ORAL
  Filled 2019-07-28 (×4): qty 2

## 2019-07-28 MED ORDER — GADOBUTROL 1 MMOL/ML IV SOLN
6.0000 mL | Freq: Once | INTRAVENOUS | Status: AC | PRN
  Administered 2019-07-28: 6 mL via INTRAVENOUS

## 2019-07-28 MED ORDER — SODIUM CHLORIDE 0.9% FLUSH: 3.0000 mL | Freq: Once | INTRAVENOUS | Status: DC

## 2019-07-28 MED ORDER — ONDANSETRON HCL 4 MG PO TABS
4.0000 mg | ORAL_TABLET | Freq: Four times a day (QID) | ORAL | Status: DC | PRN
  Filled 2019-07-28: qty 1

## 2019-07-28 MED ORDER — ACETAMINOPHEN 650 MG RE SUPP: 650.0000 mg | Freq: Four times a day (QID) | RECTAL | Status: DC | PRN

## 2019-07-28 MED ORDER — SODIUM CHLORIDE 0.9 % IV BOLUS
1000.0000 mL | Freq: Once | INTRAVENOUS | Status: AC
  Administered 2019-07-28: 1000 mL via INTRAVENOUS

## 2019-07-28 MED ORDER — SODIUM CHLORIDE 0.9 % IV SOLN
250.0000 mL | INTRAVENOUS | Status: DC | PRN
  Administered 2019-08-07: 250 mL via INTRAVENOUS

## 2019-07-28 NOTE — H&P (Signed)
Arthur [name]:[id number] DOB: [birth year] DOA: 07/28/2019     PCP: Randall Hiss, MD   Outpatient Specialists:   NONE    Patient arrived to ER on 07/28/19 at 1018  Patient coming from: home Lives with someone  Chief Complaint:   Chief Complaint  Patient presents with   Dizziness   Weakness    HPI: Arthur [name] is a 38 y.o. male with medical history significant of untreated HIV, anemia, depression, GERD, substance abuse    Presented with   days of worsening dizziness lightheadedness intermittent headaches no nausea vomiting no diarrhea but some decreased p.o. intake.  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 on 07/23/2019 With fluorescent treponemal antibodies being reactive CD4 count on 26th of  October was 35  no longer drinking   Infectious risk factors:  Reports none In  ER RAPID COVID TEST  in house testing  Pending  No results found for: SARSCOV2NAA   Regarding pertinent Chronic problems:  HIV/AID not treated  While in ER:  The following Work up has been ordered so far:  Orders Placed This Encounter  Procedures   SARS CORONAVIRUS 2 (TAT 6-24 HRS) Nasopharyngeal Nasopharyngeal Swab   CT Head Wo Contrast   MR Brain W and Wo Contrast   Basic metabolic panel   CBC   Cardiac monitoring   Saline Lock IV, Maintain IV access   Orthostatic vital signs   Visual acuity screening   Consult to infectious diseases   Consult for Unassigned Medical Admission  ALL PATIENTS BEING ADMITTED/HAVING PROCEDURES NEED COVID-19 SCREENING   Pulse oximetry, continuous    Following Medications were ordered in  ER: Medications  sodium chloride flush (NS) 0.9 % injection 3 mL (has no administration in time range)  sodium chloride 0.9 % bolus 1,000 mL (0 mLs Intravenous Stopped 07/28/19 1728)  gadobutrol (GADAVIST) 1 MMOL/ML injection 6 mL (6 mLs Intravenous Contrast Given 07/28/19 1638)       ER Provider Called:  Comer with ID     They Recommend admit to medicine   Will see in AM    Significant initial  Findings: Abnormal Labs Reviewed  BASIC METABOLIC PANEL - Abnormal; Notable for the following components:      Result Value   Sodium 132 (*)    All other components within normal limits  CBC - Abnormal; Notable for the following components:   WBC 2.5 (*)    All other components within normal limits    Otherwise labs showing:    Recent Labs  Lab 07/22/19 1211 07/26/19 0915 07/28/19 1059  NA 131* 133* 132*  K 4.3 4.2 4.3  CO2 GLUCOSE 89 95 89  BUN CREATININE 1.01 1.13 1.01  CALCIUM 9.1 9.3 9.5    Cr   stable,   Lab Results  Component Value Date   CREATININE 1.01 07/28/2019   CREATININE 1.13 07/26/2019   CREATININE 1.01 07/22/2019    Recent Labs  Lab 07/26/19 0915  AST 30  ALT 17  ALKPHOS 61  BILITOT 0.8  PROT 10.0*  ALBUMIN 3.5   Lab Results  Component Value Date   CALCIUM 9.5 07/28/2019   PHOS 3.5 05/30/2013       WBC      Component Value Date/Time   WBC 2.5 (L) 07/28/2019 1059   ANC    Component Value Date/Time   NEUTROABS 2.1 04/15/2019 1757   ALC No components found for: LYMPHAB    Plt: Lab Results  Component Value Date   PLT 312 07/28/2019     Lactic Acid, Venous    Component Value Date/Time   LATICACIDVEN 1.73 03/15/2016 1339      HG/HCT  stable,       Component Value Date/Time   HGB 15.3 07/28/2019 1059   HCT 46.3 07/28/2019 1059      ECG: Ordered Personally reviewed by me showing: HR : 89 Rhythm:  NSR, left hypertrophy  no evidence of ischemic changes QTC 437    UA   no evidence of UTI     Urine  analysis:    Component Value Date/Time   COLORURINE AMBER (A) 07/26/2019 1317   APPEARANCEUR CLEAR 07/26/2019 1317   LABSPEC 1.029 07/26/2019 1317   PHURINE 6.0 07/26/2019 1317   GLUCOSEU NEGATIVE 07/26/2019 1317   HGBUR SMALL (A) 07/26/2019 1317   BILIRUBINUR NEGATIVE 07/26/2019 1317   KETONESUR 20 (A) 07/26/2019 1317   PROTEINUR 30 (A) 07/26/2019 1317   UROBILINOGEN 1.0 03/06/2011 1601   NITRITE NEGATIVE 07/26/2019 1317   LEUKOCYTESUR NEGATIVE 07/26/2019 1317       Ordered  CT HEAD small Foci of increased attenuation within cerebellum  CXR -  NON acute on October 26  MRI brain showing widespread areas of abnormal low-level restricted diffusion 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 --      Excl. in GC? --   TMAX(24)@       Latest  Blood pressure 114/80, pulse (!) 46, temperature 98.7 F (37.1 C), temperature source Oral, resp. rate (!) 25, SpO2 100 %.     Hospitalist was called for admission for AIDS, opportunistic infection   Review of Systems:    Pertinent positives include: disequilibrium  headaches,  Constitutional:  No weight loss, night sweats, Fevers, chills, fatigue, weight loss  HEENT:  No Difficulty swallowing,Tooth/dental problems,Sore throat,  No sneezing, itching, ear ache, nasal congestion, post nasal drip,  Cardio-vascular:  No chest pain, Orthopnea, PND, anasarca, dizziness, palpitations.no Bilateral lower extremity swelling  GI:  No heartburn, indigestion, abdominal pain, nausea, vomiting, diarrhea, change in  bowel habits, loss of appetite, melena, blood in stool, hematemesis Resp:  no shortness of breath at rest. No dyspnea on exertion, No excess mucus, no productive cough, No non-productive cough, No coughing up of blood.No change in color of mucus.No wheezing. Skin:  no rash or lesions. No  jaundice GU:  no dysuria, change in color of urine, no urgency or frequency. No straining to urinate.  No flank pain.  Musculoskeletal:  No joint pain or no joint swelling. No decreased range of motion. No back pain.  Psych:  No change in mood or affect. No depression or anxiety. No memory loss.  Neuro: no localizing neurological complaints, no tingling, no weakness, no double vision, no gait abnormality, no slurred speech, no confusion  All systems reviewed and apart from HOPI all are negative  Past Medical History:   Past Medical History:  Diagnosis Date   Anemia    Anxiety    Depression    GERD (gastroesophageal reflux disease)    HIV infection (HCC)    Substance abuse (HCC)       Past Surgical History:  Procedure Laterality Date   TOOTH EXTRACTION      Social History:  Ambulatory  independently      reports that he has been smoking. He has a 6.80 pack-year smoking history. He has never used smokeless tobacco. He reports current drug use. Drug: Marijuana. He reports that he does not drink alcohol.     Family History:   Family History  Problem Relation Age of Onset   Diabetes Neg Hx    CAD Neg Hx    Hypertension Neg Hx     Allergies: No Known Allergies   Prior to Admission medications   Medication Sig Start Date End Date Taking? Authorizing Provider  ondansetron (ZOFRAN ODT) 8 MG disintegrating tablet Take 1 tablet (8 mg total) by mouth every 8 (eight) hours as needed for nausea or vomiting. 07/26/19   Margarita Grizzleay, Danielle, MD  ritonavir (NORVIR) 100 MG TABS tablet Take 1 tablet (100 mg total) by mouth daily. Patient not taking: Reported on 01/22/2018 02/06/14   Daiva EvesVan Dam,  Lisette Grinderornelius N, MD  TRUVADA 200-300 MG per tablet TAKE 1 TABLET BY MOUTH DAILY Patient not taking: Reported on 01/22/2018 05/12/14   Daiva EvesVan Dam, Lisette Grinderornelius N, MD   Physical Exam: Blood pressure 114/80, pulse (!) 46, temperature 98.7 F (37.1 C), temperature source Oral, resp. rate (!) 25, SpO2 100 %. 1. General:  in No  Acute distress    Chronically ill  -appearing 2. Psychological: Alert and  Oriented, slurred speech 3. Head/ENT:  Dry Mucous Membranes                          Head Non traumatic, neck supple                           Poor Dentition 4. SKIN:  decreased Skin turgor,  Skin clean Dry and intact no rash 5. Heart: Regular rate and rhythm no Murmur, no Rub or gallop 6. Lungs: no wheezes or crackles   7. Abdomen: Soft, non-tender, Non distended  bowel sounds present 8. Lower extremities: no clubbing, cyanosis, no  edema 9. Neurologically   strength 5 out of 5 in all 4 extremities cranial nerves II through XII intact 10. MSK: Normal range of motion   All other LABS:     Recent Labs  Lab 07/22/19 1211 07/26/19 0915 07/28/19 1059  WBC 3.2* 2.4* 2.5*  HGB 13.3 15.1 15.3  HCT 41.2 46.2 46.3  MCV 92.6 91.1 90.8  PLT 307 299 312     Recent Labs  Lab 07/22/19 1211 07/26/19 0915 07/28/19 1059  NA 131* 133* 132*  K 4.3  4.2 4.3  CL 100 97* 99  CO2 24 26 25   GLUCOSE 89 95 89  BUN 14 18 14   CREATININE 1.01 1.13 1.01  CALCIUM 9.1 9.3 9.5     Recent Labs  Lab 07/26/19 0915  AST 30  ALT 17  ALKPHOS 61  BILITOT 0.8  PROT 10.0*  ALBUMIN 3.5       Cultures:    Component Value Date/Time   SDES BLOOD RIGHT ANTECUBITAL 03/15/2016 1450   SPECREQUEST BOTTLES DRAWN AEROBIC AND ANAEROBIC 5 CC 03/15/2016 1450   CULT (A) 03/15/2016 1450    STAPHYLOCOCCUS SPECIES (COAGULASE NEGATIVE) THE SIGNIFICANCE OF ISOLATING THIS ORGANISM FROM A SINGLE SET OF BLOOD CULTURES WHEN MULTIPLE SETS ARE DRAWN IS UNCERTAIN. PLEASE NOTIFY THE MICROBIOLOGY DEPARTMENT WITHIN ONE WEEK IF SPECIATION  AND SENSITIVITIES ARE REQUIRED. Performed at [facility] 03/18/2016 FINAL 03/15/2016 1450     Radiological Exams on Admission: Ct Head Wo Contrast  Result Date: 07/28/2019 CLINICAL DATA:  Generalized weakness and dizziness for the last 2 weeks. Denies pain or injury EXAM: CT HEAD WITHOUT CONTRAST TECHNIQUE: Contiguous axial images were obtained from the base of the skull through the vertex without intravenous contrast. COMPARISON:  CT, 01/22/2016. FINDINGS: Brain: There are small hyperattenuating foci along the cerebellum, new when compared to the prior CT. There is no defined mass and no mass effect. The ventricles are normal in configuration, but larger than expected for this patient's age, and larger than on the prior CT. No hydrocephalus. No evidence of an infarct.  There is no intracranial hemorrhage. Vascular: No hyperdense vessel or unexpected calcification. Skull: Normal. Negative for fracture or focal lesion. Sinuses/Orbits: Globes and orbits are unremarkable. Mild ethmoid and sphenoid sinus mucosal thickening. Clear mastoid air cells. Other: None. IMPRESSION: 1. Small foci of increased attenuation within the cerebellum. Possible etiologies include cirrhosis, infection and metastatic disease. 2. Mild ventricular enlargement greater than expected for this patient's age and increased in size compared to the prior CT. 3. No other intracranial abnormalities. 4. Recommend follow-up MRI of the brain without and with contrast further assessment. Electronically Signed   By: Lajean Manes M.D.   On: 07/28/2019 13:24   Mr Brain W And Wo Contrast  Result Date: 07/28/2019 CLINICAL DATA:  Unexplained altered level of consciousness. Generalized weakness with dizziness and decreased appetite for 2 weeks. HIV. EXAM: MRI HEAD WITHOUT AND WITH CONTRAST TECHNIQUE: Multiplanar, multiecho pulse sequences of the brain and surrounding structures were obtained without and with intravenous  contrast. CONTRAST:  44mL GADAVIST GADOBUTROL 1 MMOL/ML IV SOLN COMPARISON:  CT head earlier today. FINDINGS: Brain: Widespread areas of abnormal low level restricted diffusion are predominantly infratentorial, affecting the cerebellar cortex, white matter, and leptomeningeal spaces including the prepontine cistern. Other similar non confluent cortical and subcortical white matter lesions are seen throughout both cerebral hemispheres with the dominant abnormality in the LEFT anterior frontal white matter. These lesions are largely T2 and FLAIR hyperintense and demonstrate abnormal postcontrast enhancement. No midline shift. Generalized atrophy. Minor foci of white matter disease elsewhere, premature for age but not clearly acute. Vascular: Normal flow voids. Skull and upper cervical spine: Normal marrow signal. Sinuses/Orbits: Negative. Other: None. Correlating with the patient's earlier CT, the areas of increased attenuation in the cerebellum are favored to represent calcification rather than hemorrhage. IMPRESSION: 1. Widespread areas of abnormal low level restricted diffusion and postcontrast enhancement throughout both cerebral hemispheres, predominantly infratentorial, with the dominant abnormality in the LEFT anterior frontal  white matter. In this immunocompromised patient, the findings are most consistent with an opportunistic infection, such as toxoplasmosis, tuberculosis or fungal cerebritis/meningitis, or parasitic infection. Infectious disease consultation is warranted. 2. Generalized atrophy with mild small vessel disease elsewhere, premature for age but not clearly acute. 3. No midline shift or acute hydrocephalus. Electronically Signed   By: Elsie Stain M.D.   On: 07/28/2019 17:28    Chart has been reviewed    Assessment/Plan  38 y.o. male with medical history significant of untreated HIV, anemia, depression, GERD, substance abuse  Admitted for intracerebral opportunistic infection  Present  on Admission:  Acquired immunodeficiency syndrome (HCC) -discussed with infectious disease will see patient in a.m.  Order viral load/CD4 count few days ago was 35 patient is risk of multiple opportunistic infections.  Abnormal MRI findings - appreciate ID consult   Per Dr. 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  DVT prophylaxis:  SCD    Code Status:  FULL CODE   as per patient   I had personally discussed CODE STATUS with patient    Family Communication:   Family not at  Bedside    Disposition Plan:       To home once workup is complete and patient is stable    Would benefit from PT/OT eval prior to DC  Ordered                  Consults called:   ID is aware will see pt in AM  Admission status:  ED Disposition    ED Disposition Condition Comment   Admit  The patient appears reasonably stabilized for admission considering the current resources, flow, and capabilities available in the ED at this time, and I doubt any other [facility] requiring further screening and/or treatment in the ED prior to admission is  present.       Obs       Level of care        medical floor   Precautions:  No active isolations  PPE: Used by the provider:   P100  eye Goggles,  Gloves  gown      Jessice Madill 07/28/2019, 9:55 PM     [facility]     after 2 AM please page floor coverage PA If 7AM-7PM, please contact the day team taking care of the patient using [email or web address]

## 2019-07-28 NOTE — ED Triage Notes (Signed)
C/o generalized weakness, dizziness, and decreased appetite x 2 weeks.  Denies pain.  Denies nausea, vomiting, and diarrhea.

## 2019-07-28 NOTE — ED Provider Notes (Addendum)
[name] EMERGENCY DEPARTMENT Provider Note   CSN: [id number] Arrival date & time: 07/28/19  1018     History   Chief Complaint Chief Complaint  Patient presents with  . Dizziness  . Weakness    HPI Arthur [name] is a 38 y.o. male.          Patient complains of dizziness and blurred vision.  Patient has a history of HIV has not been taking his medicines for over a year.  This is been going on for over a week and has been seen now 3 times in the emergency department for.  The history is provided by the patient. No language interpreter was used.  Dizziness Quality:  Lightheadedness Severity:  Moderate Onset quality:  Sudden Timing:  Constant Progression:  Worsening Chronicity:  New Context: not when bending over   Relieved by:  Nothing Worsened by:  Nothing Ineffective treatments:  None tried Associated symptoms: weakness   Associated symptoms: no blood in stool, no chest pain, no diarrhea and no headaches   Weakness Associated symptoms: dizziness   Associated symptoms: no abdominal pain, no chest pain, no cough, no diarrhea, no frequency, no headaches and no seizures     Past Medical History:  Diagnosis Date  . Anemia   . Anxiety   . Depression   . GERD (gastroesophageal reflux disease)   . HIV infection (HCC)   . Substance abuse [facility])     Patient Active Problem List   Diagnosis Date Noted  . Nausea and vomiting 03/25/2013  . Muscle spasm 03/25/2013  . Drug use 01/11/2012  . 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 COUGH 06/23/2009  . CHEST PAIN, PLEURITIC 06/23/2009  . FUNGAL DERMATITIS 05/28/2008  . GERD 05/28/2008  . DENTAL CARIES 02/13/2008  . NICOTINE ADDICTION 11/14/2007  . BACK STRAIN, LUMBAR 08/22/2007  . 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 WEIGHT LOSS, RECENT 05/01/2007  . ANXIETY DEPRESSION 02/14/2007  . HIV DISEASE 02/13/2007  . CERVICAL LYMPHADENOPATHY, ANTERIOR, RIGHT 02/13/2007    Past Surgical History:  Procedure  Laterality Date  . TOOTH EXTRACTION          Home Medications    Prior to Admission medications   Medication Sig Start Date End Date Taking? Authorizing Provider  ondansetron (ZOFRAN ODT) 8 MG disintegrating tablet Take 1 tablet (8 mg total) by mouth every 8 (eight) hours as needed for nausea or vomiting. 07/26/19   Margarita Grizzle, MD  ritonavir (NORVIR) 100 MG TABS tablet Take 1 tablet (100 mg total) by mouth daily. Patient not taking: Reported on 01/22/2018 02/06/14   [name], [name], MD  TRUVADA 200-300 MG per tablet TAKE 1 TABLET BY MOUTH DAILY Patient not taking: Reported on 01/22/2018 05/12/14   [name], [name], MD    Family History No family history on file.  Social History Social History   Tobacco Use  . Smoking status: Current Every Day Smoker    Packs/day: 0.40    Years: 17.00    Pack years: 6.80  . Smokeless tobacco: Never Used  Substance Use Topics  . Alcohol use: No    Comment: occassionally  . Drug use: Yes    Types: Marijuana     Allergies   Patient has no known allergies.   Review of Systems Review of Systems  Constitutional: Negative for appetite change and fatigue.  HENT: Negative for congestion, ear discharge and sinus pressure.        Lower division  Eyes: Negative for  discharge.       Blurred vision  Respiratory: Negative for cough.   Cardiovascular: Negative for chest pain.  Gastrointestinal: Negative for abdominal pain, blood in stool and diarrhea.  Genitourinary: Negative for frequency and hematuria.  Musculoskeletal: Negative for back pain.  Skin: Negative for rash.  Neurological: Positive for dizziness and weakness. Negative for seizures and headaches.  Psychiatric/Behavioral: Negative for hallucinations.  Patient complains of   Physical Exam Updated Vital Signs BP 114/89   Pulse (!) 48   Temp 98.7 F (37.1 C) (Oral)   Resp 15   SpO2 99%   Physical Exam Vitals signs and nursing note reviewed.  Constitutional:       Appearance: He is well-developed.  HENT:     Head: Normocephalic.  Eyes:     General: No scleral icterus.    Conjunctiva/sclera: Conjunctivae normal.  Neck:     Musculoskeletal: Neck supple.     Thyroid: No thyromegaly.  Cardiovascular:     Rate and Rhythm: Normal rate and regular rhythm.     Heart sounds: No murmur. No friction rub. No gallop.   Pulmonary:     Breath sounds: No stridor. No wheezing or rales.  Chest:     Chest wall: No tenderness.  Abdominal:     General: There is no distension.     Tenderness: There is no abdominal tenderness. There is no rebound.  Musculoskeletal: Normal range of motion.  Lymphadenopathy:     Cervical: No cervical adenopathy.  Skin:    Findings: No erythema or rash.  Neurological:     Mental Status: He is alert and oriented to person, place, and time.     Motor: No abnormal muscle tone.     Coordination: Coordination normal.  Psychiatric:        Behavior: Behavior normal.      ED Treatments / Results  Labs (all labs ordered are listed, but only abnormal results are displayed) Labs Reviewed  BASIC METABOLIC PANEL - Abnormal; Notable for the following components:      Result Value   Sodium 132 (*)    All other components within normal limits  CBC - Abnormal; Notable for the following components:   WBC 2.5 (*)    All other components within normal limits  SARS CORONAVIRUS 2 (TAT 6-24 HRS)    EKG None  Radiology Ct Head Wo Contrast  Result Date: 07/28/2019 CLINICAL DATA:  Generalized weakness and dizziness for the last 2 weeks. Denies pain or injury EXAM: CT HEAD WITHOUT CONTRAST TECHNIQUE: Contiguous axial images were obtained from the base of the skull through the vertex without intravenous contrast. COMPARISON:  CT, 01/22/2016. FINDINGS: Brain: There are small hyperattenuating foci along the cerebellum, new when compared to the prior CT. There is no defined mass and no mass effect. The ventricles are normal in configuration, but  larger than expected for this patient's age, and larger than on the prior CT. No hydrocephalus. No evidence of an infarct.  There is no intracranial hemorrhage. Vascular: No hyperdense vessel or unexpected calcification. Skull: Normal. Negative for fracture or focal lesion. Sinuses/Orbits: Globes and orbits are unremarkable. Mild ethmoid and sphenoid sinus mucosal thickening. Clear mastoid air cells. Other: None. IMPRESSION: 1. Small foci of increased attenuation within the cerebellum. Possible etiologies include cirrhosis, infection and metastatic disease. 2. Mild ventricular enlargement greater than expected for this patient's age and increased in size compared to the prior CT. 3. No other intracranial abnormalities. 4. Recommend follow-up MRI of the  brain without and with contrast further assessment. Electronically Signed   By: Amie Portlandavid  Ormond M.D.   On: 07/28/2019 13:24   Arthur Laqueta JeanBrain W And Wo Contrast  Result Date: 07/28/2019 CLINICAL DATA:  Unexplained altered level of consciousness. Generalized weakness with dizziness and decreased appetite for 2 weeks. HIV. EXAM: MRI HEAD WITHOUT AND WITH CONTRAST TECHNIQUE: Multiplanar, multiecho pulse sequences of the brain and surrounding structures were obtained without and with intravenous contrast. CONTRAST:  6mL GADAVIST GADOBUTROL 1 MMOL/ML IV SOLN COMPARISON:  CT head earlier today. FINDINGS: Brain: Widespread areas of abnormal low level restricted diffusion are predominantly infratentorial, affecting the cerebellar cortex, white matter, and leptomeningeal spaces including the prepontine cistern. Other similar non confluent cortical and subcortical white matter lesions are seen throughout both cerebral hemispheres with the dominant abnormality in the LEFT anterior frontal white matter. These lesions are largely T2 and FLAIR hyperintense and demonstrate abnormal postcontrast enhancement. No midline shift. Generalized atrophy. Minor foci of white matter disease  elsewhere, premature for age but not clearly acute. Vascular: Normal flow voids. Skull and upper cervical spine: Normal marrow signal. Sinuses/Orbits: Negative. Other: None. Correlating with the patient's earlier CT, the areas of increased attenuation in the cerebellum are favored to represent calcification rather than hemorrhage. IMPRESSION: 1. Widespread areas of abnormal low level restricted diffusion and postcontrast enhancement throughout both cerebral hemispheres, predominantly infratentorial, with the dominant abnormality in the LEFT anterior frontal white matter. In this immunocompromised patient, the findings are most consistent with an opportunistic infection, such as toxoplasmosis, tuberculosis or fungal cerebritis/meningitis, or parasitic infection. Infectious disease consultation is warranted. 2. Generalized atrophy with mild small vessel disease elsewhere, premature for age but not clearly acute. 3. No midline shift or acute hydrocephalus. Electronically Signed   By: Elsie StainJohn T Curnes M.D.   On: 07/28/2019 17:28    Procedures Procedures (including critical care time)  Medications Ordered in ED Medications  sodium chloride flush (NS) 0.9 % injection 3 mL (has no administration in time range)  sodium chloride 0.9 % bolus 1,000 mL (0 mLs Intravenous Stopped 07/28/19 1728)  gadobutrol (GADAVIST) 1 MMOL/ML injection 6 mL (6 mLs Intravenous Contrast Given 07/28/19 1638)     Initial Impression / Assessment and Plan / ED Course  I have reviewed the triage vital signs and the nursing notes.  Pertinent labs & imaging results that were available during my care of the patient were reviewed by me and considered in my medical decision making (see chart for details).        MRI shows possible infection from the HIV in his brain.  I spoke with Dr. Orvan Falconerampbell from infectious disease and he agreed with admitting the patient to the hospitalist and infectious disease will see the patient in the morning   Final Clinical Impressions(s) / ED Diagnoses   Final diagnoses:  Dizziness    ED Discharge Orders    None       Bethann BerkshireZammit, Tashiana Lamarca, MD 07/28/19 2019    Bethann BerkshireZammit, Datron Brakebill, MD 08/11/19 1154

## 2019-07-29 DIAGNOSIS — R918 Other nonspecific abnormal finding of lung field: Secondary | ICD-10-CM | POA: Diagnosis not present

## 2019-07-29 DIAGNOSIS — I959 Hypotension, unspecified: Secondary | ICD-10-CM | POA: Diagnosis present

## 2019-07-29 DIAGNOSIS — F329 Major depressive disorder, single episode, unspecified: Secondary | ICD-10-CM | POA: Diagnosis present

## 2019-07-29 DIAGNOSIS — D702 Other drug-induced agranulocytosis: Secondary | ICD-10-CM | POA: Diagnosis not present

## 2019-07-29 DIAGNOSIS — H469 Unspecified optic neuritis: Secondary | ICD-10-CM | POA: Diagnosis not present

## 2019-07-29 DIAGNOSIS — Z20828 Contact with and (suspected) exposure to other viral communicable diseases: Secondary | ICD-10-CM | POA: Diagnosis present

## 2019-07-29 DIAGNOSIS — Z681 Body mass index (BMI) 19 or less, adult: Secondary | ICD-10-CM | POA: Diagnosis not present

## 2019-07-29 DIAGNOSIS — R531 Weakness: Secondary | ICD-10-CM | POA: Diagnosis not present

## 2019-07-29 DIAGNOSIS — E162 Hypoglycemia, unspecified: Secondary | ICD-10-CM | POA: Diagnosis not present

## 2019-07-29 DIAGNOSIS — B589 Toxoplasmosis, unspecified: Secondary | ICD-10-CM | POA: Diagnosis not present

## 2019-07-29 DIAGNOSIS — F1990 Other psychoactive substance use, unspecified, uncomplicated: Secondary | ICD-10-CM

## 2019-07-29 DIAGNOSIS — F17210 Nicotine dependence, cigarettes, uncomplicated: Secondary | ICD-10-CM

## 2019-07-29 DIAGNOSIS — B20 Human immunodeficiency virus [HIV] disease: Secondary | ICD-10-CM | POA: Diagnosis not present

## 2019-07-29 DIAGNOSIS — A523 Neurosyphilis, unspecified: Secondary | ICD-10-CM | POA: Diagnosis not present

## 2019-07-29 DIAGNOSIS — K5901 Slow transit constipation: Secondary | ICD-10-CM | POA: Diagnosis not present

## 2019-07-29 DIAGNOSIS — R1312 Dysphagia, oropharyngeal phase: Secondary | ICD-10-CM | POA: Diagnosis not present

## 2019-07-29 DIAGNOSIS — G939 Disorder of brain, unspecified: Secondary | ICD-10-CM | POA: Diagnosis not present

## 2019-07-29 DIAGNOSIS — H30891 Other chorioretinal inflammations, right eye: Secondary | ICD-10-CM | POA: Diagnosis present

## 2019-07-29 DIAGNOSIS — G92 Toxic encephalopathy: Secondary | ICD-10-CM | POA: Diagnosis present

## 2019-07-29 DIAGNOSIS — H547 Unspecified visual loss: Secondary | ICD-10-CM

## 2019-07-29 DIAGNOSIS — R5381 Other malaise: Secondary | ICD-10-CM | POA: Diagnosis not present

## 2019-07-29 DIAGNOSIS — M6258 Muscle wasting and atrophy, not elsewhere classified, other site: Secondary | ICD-10-CM | POA: Diagnosis not present

## 2019-07-29 DIAGNOSIS — Z23 Encounter for immunization: Secondary | ICD-10-CM | POA: Diagnosis not present

## 2019-07-29 DIAGNOSIS — H30899 Other chorioretinal inflammations, unspecified eye: Secondary | ICD-10-CM | POA: Diagnosis present

## 2019-07-29 DIAGNOSIS — I9589 Other hypotension: Secondary | ICD-10-CM | POA: Diagnosis not present

## 2019-07-29 DIAGNOSIS — R1310 Dysphagia, unspecified: Secondary | ICD-10-CM | POA: Diagnosis not present

## 2019-07-29 DIAGNOSIS — K219 Gastro-esophageal reflux disease without esophagitis: Secondary | ICD-10-CM

## 2019-07-29 DIAGNOSIS — T17908S Unspecified foreign body in respiratory tract, part unspecified causing other injury, sequela: Secondary | ICD-10-CM | POA: Diagnosis not present

## 2019-07-29 DIAGNOSIS — A530 Latent syphilis, unspecified as early or late: Secondary | ICD-10-CM | POA: Diagnosis not present

## 2019-07-29 DIAGNOSIS — R64 Cachexia: Secondary | ICD-10-CM | POA: Diagnosis not present

## 2019-07-29 DIAGNOSIS — R9089 Other abnormal findings on diagnostic imaging of central nervous system: Secondary | ICD-10-CM | POA: Diagnosis not present

## 2019-07-29 DIAGNOSIS — H3090 Unspecified chorioretinal inflammation, unspecified eye: Secondary | ICD-10-CM | POA: Diagnosis not present

## 2019-07-29 DIAGNOSIS — H5461 Unqualified visual loss, right eye, normal vision left eye: Secondary | ICD-10-CM | POA: Diagnosis not present

## 2019-07-29 DIAGNOSIS — E870 Hyperosmolality and hypernatremia: Secondary | ICD-10-CM | POA: Diagnosis not present

## 2019-07-29 DIAGNOSIS — Z79899 Other long term (current) drug therapy: Secondary | ICD-10-CM | POA: Diagnosis not present

## 2019-07-29 DIAGNOSIS — E871 Hypo-osmolality and hyponatremia: Secondary | ICD-10-CM | POA: Diagnosis not present

## 2019-07-29 DIAGNOSIS — B258 Other cytomegaloviral diseases: Secondary | ICD-10-CM | POA: Diagnosis present

## 2019-07-29 DIAGNOSIS — Z515 Encounter for palliative care: Secondary | ICD-10-CM | POA: Diagnosis not present

## 2019-07-29 DIAGNOSIS — R7309 Other abnormal glucose: Secondary | ICD-10-CM | POA: Diagnosis not present

## 2019-07-29 DIAGNOSIS — R42 Dizziness and giddiness: Secondary | ICD-10-CM

## 2019-07-29 DIAGNOSIS — J181 Lobar pneumonia, unspecified organism: Secondary | ICD-10-CM | POA: Diagnosis not present

## 2019-07-29 DIAGNOSIS — R0989 Other specified symptoms and signs involving the circulatory and respiratory systems: Secondary | ICD-10-CM | POA: Diagnosis not present

## 2019-07-29 DIAGNOSIS — B370 Candidal stomatitis: Secondary | ICD-10-CM | POA: Diagnosis not present

## 2019-07-29 DIAGNOSIS — N179 Acute kidney failure, unspecified: Secondary | ICD-10-CM | POA: Diagnosis present

## 2019-07-29 DIAGNOSIS — D72818 Other decreased white blood cell count: Secondary | ICD-10-CM | POA: Diagnosis not present

## 2019-07-29 DIAGNOSIS — B379 Candidiasis, unspecified: Secondary | ICD-10-CM | POA: Diagnosis not present

## 2019-07-29 DIAGNOSIS — D62 Acute posthemorrhagic anemia: Secondary | ICD-10-CM | POA: Diagnosis not present

## 2019-07-29 DIAGNOSIS — F0280 Dementia in other diseases classified elsewhere without behavioral disturbance: Secondary | ICD-10-CM | POA: Diagnosis not present

## 2019-07-29 DIAGNOSIS — B582 Toxoplasma meningoencephalitis: Secondary | ICD-10-CM | POA: Diagnosis not present

## 2019-07-29 DIAGNOSIS — Z931 Gastrostomy status: Secondary | ICD-10-CM | POA: Diagnosis not present

## 2019-07-29 DIAGNOSIS — E43 Unspecified severe protein-calorie malnutrition: Secondary | ICD-10-CM | POA: Diagnosis not present

## 2019-07-29 DIAGNOSIS — F419 Anxiety disorder, unspecified: Secondary | ICD-10-CM

## 2019-07-29 DIAGNOSIS — Z7189 Other specified counseling: Secondary | ICD-10-CM | POA: Diagnosis not present

## 2019-07-29 DIAGNOSIS — R9402 Abnormal brain scan: Secondary | ICD-10-CM

## 2019-07-29 DIAGNOSIS — E222 Syndrome of inappropriate secretion of antidiuretic hormone: Secondary | ICD-10-CM | POA: Diagnosis not present

## 2019-07-29 DIAGNOSIS — I952 Hypotension due to drugs: Secondary | ICD-10-CM | POA: Diagnosis not present

## 2019-07-29 DIAGNOSIS — D72819 Decreased white blood cell count, unspecified: Secondary | ICD-10-CM | POA: Diagnosis not present

## 2019-07-29 DIAGNOSIS — J9601 Acute respiratory failure with hypoxia: Secondary | ICD-10-CM | POA: Diagnosis not present

## 2019-07-29 DIAGNOSIS — B3781 Candidal esophagitis: Secondary | ICD-10-CM | POA: Diagnosis not present

## 2019-07-29 DIAGNOSIS — H3091 Unspecified chorioretinal inflammation, right eye: Secondary | ICD-10-CM | POA: Diagnosis not present

## 2019-07-29 DIAGNOSIS — R112 Nausea with vomiting, unspecified: Secondary | ICD-10-CM | POA: Diagnosis not present

## 2019-07-29 DIAGNOSIS — R001 Bradycardia, unspecified: Secondary | ICD-10-CM | POA: Diagnosis not present

## 2019-07-29 LAB — CBC
HCT: 42.6 % (ref 39.0–52.0)
Hemoglobin: 14.4 g/dL (ref 13.0–17.0)
MCH: 30.3 pg (ref 26.0–34.0)
MCHC: 33.8 g/dL (ref 30.0–36.0)
MCV: 89.5 fL (ref 80.0–100.0)
Platelets: 240 10*3/uL (ref 150–400)
RBC: 4.76 MIL/uL (ref 4.22–5.81)
RDW: 13.2 % (ref 11.5–15.5)
WBC: 2.7 10*3/uL — ABNORMAL LOW (ref 4.0–10.5)
nRBC: 0.8 % — ABNORMAL HIGH (ref 0.0–0.2)

## 2019-07-29 LAB — COMPREHENSIVE METABOLIC PANEL
ALT: 16 U/L (ref 0–44)
AST: 30 U/L (ref 15–41)
Albumin: 2.9 g/dL — ABNORMAL LOW (ref 3.5–5.0)
Alkaline Phosphatase: 49 U/L (ref 38–126)
Anion gap: 5 (ref 5–15)
BUN: 16 mg/dL (ref 6–20)
CO2: 25 mmol/L (ref 22–32)
Calcium: 8.9 mg/dL (ref 8.9–10.3)
Chloride: 104 mmol/L (ref 98–111)
Creatinine, Ser: 0.87 mg/dL (ref 0.61–1.24)
GFR calc Af Amer: >60 mL/min (ref >60)
GFR calc non Af Amer: >60 mL/min (ref >60)
Glucose, Bld: 96 mg/dL (ref 70–99)
Potassium: 4.4 mmol/L (ref 3.5–5.1)
Sodium: 134 mmol/L — ABNORMAL LOW (ref 135–145)
Total Bilirubin: 0.4 mg/dL (ref 0.3–1.2)
Total Protein: 8.3 g/dL — ABNORMAL HIGH (ref 6.5–8.1)

## 2019-07-29 LAB — RAPID URINE DRUG SCREEN, HOSP PERFORMED
Amphetamines: NOT DETECTED
Barbiturates: NOT DETECTED
Benzodiazepines: NOT DETECTED
Cocaine: NOT DETECTED
Opiates: NOT DETECTED
Tetrahydrocannabinol: POSITIVE — AB

## 2019-07-29 LAB — CREATININE, URINE, RANDOM: Creatinine, Urine: 204.19 mg/dL

## 2019-07-29 LAB — PHOSPHORUS: Phosphorus: 3.1 mg/dL (ref 2.5–4.6)

## 2019-07-29 LAB — HEPATITIS PANEL, ACUTE
HCV Ab: NONREACTIVE
Hep A IgM: NONREACTIVE
Hep B C IgM: NONREACTIVE
Hepatitis B Surface Ag: NONREACTIVE

## 2019-07-29 LAB — CRYPTOCOCCAL ANTIGEN: Crypto Ag: NEGATIVE

## 2019-07-29 LAB — PATHOLOGIST SMEAR REVIEW

## 2019-07-29 LAB — SODIUM, URINE, RANDOM: Sodium, Ur: 111 mmol/L

## 2019-07-29 LAB — MAGNESIUM: Magnesium: 2 mg/dL (ref 1.7–2.4)

## 2019-07-29 LAB — TSH: TSH: 0.605 u[IU]/mL (ref 0.350–4.500)

## 2019-07-29 LAB — SARS CORONAVIRUS 2 (TAT 6-24 HRS): SARS Coronavirus 2: NEGATIVE

## 2019-07-29 MED ORDER — PENICILLIN G POTASSIUM 20000000 UNITS IJ SOLR
12.0000 10*6.[IU] | Freq: Two times a day (BID) | INTRAVENOUS | Status: AC
  Administered 2019-07-29 – 2019-08-12 (×27): 12 10*6.[IU] via INTRAVENOUS
  Filled 2019-07-29 (×33): qty 12

## 2019-07-29 MED ORDER — ENOXAPARIN SODIUM 40 MG/0.4ML ~~LOC~~ SOLN
40.0000 mg | Freq: Every day | SUBCUTANEOUS | Status: DC
  Administered 2019-07-29: 40 mg via SUBCUTANEOUS
  Filled 2019-07-29: qty 0.4

## 2019-07-29 MED ORDER — PENICILLIN G POTASSIUM 5000000 UNITS IJ SOLR
12.0000 10*6.[IU] | Freq: Two times a day (BID) | INTRAVENOUS | Status: DC
  Filled 2019-07-29: qty 12

## 2019-07-29 MED ORDER — SODIUM CHLORIDE 0.9 % IV SOLN
5.0000 mg/kg | Freq: Two times a day (BID) | INTRAVENOUS | Status: DC
  Administered 2019-07-29 – 2019-08-07 (×18): 285 mg via INTRAVENOUS
  Filled 2019-07-29 (×28): qty 5.7

## 2019-07-29 MED ORDER — SULFAMETHOXAZOLE-TRIMETHOPRIM 800-160 MG PO TABS
1.0000 | ORAL_TABLET | Freq: Every day | ORAL | Status: DC
  Administered 2019-07-30 – 2019-08-03 (×5): 1 via ORAL
  Filled 2019-07-29 (×9): qty 1

## 2019-07-29 NOTE — ED Notes (Signed)
ED TO INPATIENT HANDOFF REPORT  ED Nurse Name and Phone #:  [phone]  S Name/Age/Gender Arthur [name] 38 y.o. male Room/Bed: 019C/019C  Code Status   Code Status: Full Code  Home/SNF/Other Home Patient oriented to: self, place, time and situation Is this baseline? Yes   Triage Complete: Triage complete  Chief Complaint not eating/dehydration  Triage Note C/o generalized weakness, dizziness, and decreased appetite x 2 weeks.  Denies pain.  Denies nausea, vomiting, and diarrhea.   Allergies No Known Allergies  Level of Care/Admitting Diagnosis ED Disposition    ED Disposition Condition [facility] Area: [place] [[id number]]  Level of Care: Telemetry Cardiac [103]  I expect the patient will be discharged within 24 hours: No (not a candidate for 5C-Observation unit)  Covid Evaluation: Asymptomatic Screening Protocol (No Symptoms)  Diagnosis: Acquired immunodeficiency syndrome [facility]) [[id number]]  Admitting Physician: Toy Baker [[id number]]  Attending Physician: Toy Baker [[id number]]  PT Class (Do Not Modify): Observation [104]  PT Acc Code (Do Not Modify): Observation [10022]       B Medical/Surgery History Past Medical History:  Diagnosis Date  . Anemia   . Anxiety   . Depression   . GERD (gastroesophageal reflux disease)   . HIV infection ([place])   . Substance abuse [facility])    Past Surgical History:  Procedure Laterality Date  . TOOTH EXTRACTION       A IV Location/Drains/Wounds Patient Lines/Drains/Airways Status   Active Line/Drains/Airways    Name:   Placement date:   Placement time:   Site:   Days:   Peripheral IV 07/28/19 Right Antecubital   07/28/19    1219    Antecubital   1          Intake/Output Last 24 hours  Intake/Output Summary (Last 24 hours) at 07/29/2019 5462 Last data filed at 07/28/2019 1728 Gross per 24 hour  Intake 1000 ml  Output -  Net 1000 ml    Labs/Imaging Results for orders placed  or performed during the hospital encounter of 07/28/19 (from the past 48 hour(s))  Basic metabolic panel     Status: Abnormal   Collection Time: 07/28/19 10:59 AM  Result Value Ref Range   Sodium 132 (L) 135 - 145 mmol/L   Potassium 4.3 3.5 - 5.1 mmol/L   Chloride 99 98 - 111 mmol/L   CO2 25 22 - 32 mmol/L   Glucose, Bld 89 70 - 99 mg/dL   BUN 14 6 - 20 mg/dL   Creatinine, Ser 1.01 0.61 - 1.24 mg/dL   Calcium 9.5 8.9 - 10.3 mg/dL   GFR calc non Af Amer >60 >60 mL/min   GFR calc Af Amer >60 >60 mL/min   Anion gap 8 5 - 15    Comment: Performed at [facility], [place] [address] [place], [place] [address]  CBC     Status: Abnormal   Collection Time: 07/28/19 10:59 AM  Result Value Ref Range   WBC 2.5 (L) 4.0 - 10.5 K/uL   RBC 5.10 4.22 - 5.81 MIL/uL   Hemoglobin 15.3 13.0 - 17.0 g/dL   HCT 46.3 39.0 - 52.0 %   MCV 90.8 80.0 - 100.0 fL   MCH 30.0 26.0 - 34.0 pg   MCHC 33.0 30.0 - 36.0 g/dL   RDW 13.3 11.5 - 15.5 %   Platelets 312 150 - 400 K/uL   nRBC 0.0 0.0 - 0.2 %    Comment: Performed  at [facility], [address], [place], [place] [address]  SARS CORONAVIRUS 2 (TAT 6-24 HRS) Nasopharyngeal Nasopharyngeal Swab     Status: None   Collection Time: 07/28/19  8:24 PM   Specimen: Nasopharyngeal Swab  Result Value Ref Range   SARS Coronavirus 2 NEGATIVE NEGATIVE    Comment: (NOTE) SARS-CoV-2 target nucleic acids are NOT DETECTED. The SARS-CoV-2 RNA is generally detectable in upper and lower respiratory specimens during the acute phase of infection. Negative results do not preclude SARS-CoV-2 infection, do not rule out co-infections with other pathogens, and should not be used as the sole basis for treatment or other patient management decisions. Negative results must be combined with clinical observations, patient history, and epidemiological information. The expected result is Negative. Fact Sheet for Patients: HairSlick.no Fact  Sheet for Healthcare Providers: quierodirigir.com This test is not yet approved or cleared by the [place] FDA and  has been authorized for detection and/or diagnosis of SARS-CoV-2 by FDA under an Emergency Use Authorization (EUA). This EUA will remain  in effect (meaning this test can be used) for the duration of the COVID-19 declaration under Section 56 4(b)(1) of the Act, 21 U.S.C. section 360bbb-3(b)(1), unless the authorization is terminated or revoked sooner. Performed at [facility], [address] [place], [place] [address]   Urine rapid drug screen (hosp performed)     Status: Abnormal   Collection Time: 07/28/19 10:01 PM  Result Value Ref Range   Opiates NONE DETECTED NONE DETECTED   Cocaine NONE DETECTED NONE DETECTED   Benzodiazepines NONE DETECTED NONE DETECTED   Amphetamines NONE DETECTED NONE DETECTED   Tetrahydrocannabinol POSITIVE (A) NONE DETECTED   Barbiturates NONE DETECTED NONE DETECTED    Comment: (NOTE) DRUG SCREEN FOR MEDICAL PURPOSES ONLY.  IF CONFIRMATION IS NEEDED FOR ANY PURPOSE, NOTIFY LAB WITHIN 5 DAYS. LOWEST DETECTABLE LIMITS FOR URINE DRUG SCREEN Drug Class                     Cutoff (ng/mL) Amphetamine and metabolites    1000 Barbiturate and metabolites    200 Benzodiazepine                 200 Tricyclics and metabolites     300 Opiates and metabolites        300 Cocaine and metabolites        300 THC                            50 Performed at [facility], [address] [place], [place] [address]   CBC with Differential/Platelet     Status: Abnormal   Collection Time: 07/28/19 10:01 PM  Result Value Ref Range   WBC 1.9 (L) 4.0 - 10.5 K/uL   RBC 4.72 4.22 - 5.81 MIL/uL   Hemoglobin 14.2 13.0 - 17.0 g/dL   HCT 91.4 78.2 - 95.6 %   MCV 90.7 80.0 - 100.0 fL   MCH 30.1 26.0 - 34.0 pg   MCHC 33.2 30.0 - 36.0 g/dL   RDW 21.3 08.6 - 57.8 %   Platelets 272 150 - 400 K/uL   nRBC 0.0 0.0 - 0.2 %    Neutrophils Relative % 41 %   Neutro Abs 0.8 (L) 1.7 - 7.7 K/uL   Lymphocytes Relative 35 %   Lymphs Abs 0.7 0.7 - 4.0 K/uL   Monocytes Relative 16 %  Monocytes Absolute 0.3 0.1 - 1.0 K/uL   Eosinophils Relative 6 %   Eosinophils Absolute 0.1 0.0 - 0.5 K/uL   Basophils Relative 1 %   Basophils Absolute 0.0 0.0 - 0.1 K/uL   Immature Granulocytes 1 %   Abs Immature Granulocytes 0.01 0.00 - 0.07 K/uL    Comment: Performed at [facility], [address], [place], [place] [address]  Creatinine, urine, random     Status: None   Collection Time: 07/28/19 10:01 PM  Result Value Ref Range   Creatinine, Urine 204.19 mg/dL    Comment: Performed at [facility], [address], [place], [place] [address]  Osmolality, urine     Status: Abnormal   Collection Time: 07/28/19 10:01 PM  Result Value Ref Range   Osmolality, Ur 1,024 (H) 300 - 900 mOsm/kg    Comment: Performed at [facility], [address], [place], [place] [address]  Sodium, urine, random     Status: None   Collection Time: 07/28/19 10:01 PM  Result Value Ref Range   Sodium, Ur 111 mmol/L    Comment: Performed at [facility], [address] [place], [place] [address]  TSH     Status: None   Collection Time: 07/28/19 10:19 PM  Result Value Ref Range   TSH 0.605 0.350 - 4.500 uIU/mL    Comment: Performed by a 3rd Generation assay with a functional sensitivity of <=0.01 uIU/mL. Performed at [facility], [address] [place], [place] [address]   Magnesium     Status: None   Collection Time: 07/29/19  3:32 AM  Result Value Ref Range   Magnesium 2.0 1.7 - 2.4 mg/dL    Comment: Performed at [facility], [address], [place], [place] [address]  Phosphorus     Status: None   Collection Time: 07/29/19  3:32 AM  Result Value Ref Range   Phosphorus 3.1 2.5 - 4.6 mg/dL    Comment: Performed at [facility], [address], [place], [place] [address]  Comprehensive metabolic  panel     Status: Abnormal   Collection Time: 07/29/19  3:32 AM  Result Value Ref Range   Sodium 134 (L) 135 - 145 mmol/L   Potassium 4.4 3.5 - 5.1 mmol/L   Chloride 104 98 - 111 mmol/L   CO2 25 22 - 32 mmol/L   Glucose, Bld 96 70 - 99 mg/dL   BUN 16 6 - 20 mg/dL   Creatinine, Ser 4.40 0.61 - 1.24 mg/dL   Calcium 8.9 8.9 - 10.2 mg/dL   Total Protein 8.3 (H) 6.5 - 8.1 g/dL   Albumin 2.9 (L) 3.5 - 5.0 g/dL   AST 30 15 - 41 U/L   ALT 16 0 - 44 U/L   Alkaline Phosphatase 49 38 - 126 U/L   Total Bilirubin 0.4 0.3 - 1.2 mg/dL   GFR calc non Af Amer >60 >60 mL/min   GFR calc Af Amer >60 >60 mL/min   Anion gap 5 5 - 15    Comment: Performed at [facility], [address], [place], [place] [address]  CBC     Status: Abnormal   Collection Time: 07/29/19  3:32 AM  Result Value Ref Range   WBC 2.7 (L) 4.0 - 10.5 K/uL   RBC 4.76 4.22 - 5.81 MIL/uL   Hemoglobin 14.4 13.0 - 17.0 g/dL   HCT 64.4 03.4 - 74.2 %  MCV 89.5 80.0 - 100.0 fL   MCH 30.3 26.0 - 34.0 pg   MCHC 33.8 30.0 - 36.0 g/dL   RDW 16.113.2 09.611.5 - 04.515.5 %   Platelets 240 150 - 400 K/uL   nRBC 0.8 (H) 0.0 - 0.2 %    Comment: Performed at [facility], [address] [place], [place] [id number]   Ct Head Wo Contrast  Result Date: 07/28/2019 CLINICAL DATA:  Generalized weakness and dizziness for the last 2 weeks. Denies pain or injury EXAM: CT HEAD WITHOUT CONTRAST TECHNIQUE: Contiguous axial images were obtained from the base of the skull through the vertex without intravenous contrast. COMPARISON:  CT, 01/22/2016. FINDINGS: Brain: There are small hyperattenuating foci along the cerebellum, new when compared to the prior CT. There is no defined mass and no mass effect. The ventricles are normal in configuration, but larger than expected for this patient's age, and larger than on the prior CT. No hydrocephalus. No evidence of an infarct.  There is no intracranial hemorrhage. Vascular: No hyperdense vessel or unexpected  calcification. Skull: Normal. Negative for fracture or focal lesion. Sinuses/Orbits: Globes and orbits are unremarkable. Mild ethmoid and sphenoid sinus mucosal thickening. Clear mastoid air cells. Other: None. IMPRESSION: 1. Small foci of increased attenuation within the cerebellum. Possible etiologies include cirrhosis, infection and metastatic disease. 2. Mild ventricular enlargement greater than expected for this patient's age and increased in size compared to the prior CT. 3. No other intracranial abnormalities. 4. Recommend follow-up MRI of the brain without and with contrast further assessment. Electronically Signed   By: Amie Portlandavid  Ormond M.D.   On: 07/28/2019 13:24   Mr Laqueta JeanBrain W And Wo Contrast  Result Date: 07/28/2019 CLINICAL DATA:  Unexplained altered level of consciousness. Generalized weakness with dizziness and decreased appetite for 2 weeks. HIV. EXAM: MRI HEAD WITHOUT AND WITH CONTRAST TECHNIQUE: Multiplanar, multiecho pulse sequences of the brain and surrounding structures were obtained without and with intravenous contrast. CONTRAST:  6mL GADAVIST GADOBUTROL 1 MMOL/ML IV SOLN COMPARISON:  CT head earlier today. FINDINGS: Brain: Widespread areas of abnormal low level restricted diffusion are predominantly infratentorial, affecting the cerebellar cortex, white matter, and leptomeningeal spaces including the prepontine cistern. Other similar non confluent cortical and subcortical white matter lesions are seen throughout both cerebral hemispheres with the dominant abnormality in the LEFT anterior frontal white matter. These lesions are largely T2 and FLAIR hyperintense and demonstrate abnormal postcontrast enhancement. No midline shift. Generalized atrophy. Minor foci of white matter disease elsewhere, premature for age but not clearly acute. Vascular: Normal flow voids. Skull and upper cervical spine: Normal marrow signal. Sinuses/Orbits: Negative. Other: None. Correlating with the patient's earlier  CT, the areas of increased attenuation in the cerebellum are favored to represent calcification rather than hemorrhage. IMPRESSION: 1. Widespread areas of abnormal low level restricted diffusion and postcontrast enhancement throughout both cerebral hemispheres, predominantly infratentorial, with the dominant abnormality in the LEFT anterior frontal white matter. In this immunocompromised patient, the findings are most consistent with an opportunistic infection, such as toxoplasmosis, tuberculosis or fungal cerebritis/meningitis, or parasitic infection. Infectious disease consultation is warranted. 2. Generalized atrophy with mild small vessel disease elsewhere, premature for age but not clearly acute. 3. No midline shift or acute hydrocephalus. Electronically Signed   By: Elsie StainJohn T Curnes M.D.   On: 07/28/2019 17:28    Pending Labs Unresulted Labs (From admission, onward)    Start     Ordered   07/28/19 2058  Hepatitis panel, acute  Tomorrow morning,   R     07/28/19 2057   07/28/19 2025  HIV-1 RNA quant-no reflex-bld  Once,   STAT     07/28/19 2025          Vitals/Pain Today's Vitals   07/29/19 0130 07/29/19 0215 07/29/19 0330 07/29/19 0428  BP: 112/81 104/77    Pulse: (!) 49 (!) 54    Resp: (!) 21 (!) 24    Temp:      TempSrc:      SpO2: 98% 96%    PainSc:   6  Asleep    Isolation Precautions No active isolations  Medications Medications  sodium chloride flush (NS) 0.9 % injection 3 mL (has no administration in time range)  acetaminophen (TYLENOL) tablet 650 mg (has no administration in time range)    Or  acetaminophen (TYLENOL) suppository 650 mg (has no administration in time range)  ondansetron (ZOFRAN) tablet 4 mg (has no administration in time range)    Or  ondansetron (ZOFRAN) injection 4 mg (has no administration in time range)  sodium chloride flush (NS) 0.9 % injection 3 mL (3 mLs Intravenous Given 07/29/19 0019)  sodium chloride flush (NS) 0.9 % injection 3 mL (has no  administration in time range)  0.9 %  sodium chloride infusion (has no administration in time range)  0.9 %  sodium chloride infusion ( Intravenous New Bag/Given 07/29/19 0015)  sodium chloride 0.9 % bolus 1,000 mL (0 mLs Intravenous Stopped 07/28/19 1728)  gadobutrol (GADAVIST) 1 MMOL/ML injection 6 mL (6 mLs Intravenous Contrast Given 07/28/19 1638)    Mobility walks with person assist Low fall risk   Focused Assessments Cardiac Assessment Handoff:  Cardiac Rhythm: Sinus bradycardia No results found for: CKTOTAL, CKMB, CKMBINDEX, TROPONINI No results found for: DDIMER Does the Patient currently have chest pain? No      R Recommendations: See Admitting Provider Note  Report given to:   Additional Notes:

## 2019-07-29 NOTE — Consult Note (Signed)
CC: Loss of vision OD in the setting of AIDS  HPI: 38 yo with Hx of HIV presents for consultation due to loss of vision OD. Pt has been seen for dizziness and syncopal episode for which he was seen several times in ED over the last week. Most recently presented with c/o blurred vision to ED last night. Patient has not taken antiretroviral meds in about 1 year. Noted to by HIV+ with CD4 of 35 and +RPR and +FTA-ABS. Being treated with IV penicillin and IV ganciclovir as well as Bactrim 1 DS QD.   Pt notes 2-3 days ago his vision went black OD. He is unsure if this happened suddenly or not, but states that he woke up with the vision gone. He denies any significant pain, flashes of light, or floaters. Denies redness or discharge or swelling. No significant ocular history. He does not wear glasses and has never had any eye surgery. He denies any visual loss or ocular symptoms in the OS.  ROS: as in HPI dizziness, weakness  Patient Active Problem List   Diagnosis Date Noted  . HIV (human immunodeficiency virus infection) (HCC) 07/29/2019  . Vision loss 07/29/2019  . Abnormal brain MRI 07/29/2019  . Acquired immunodeficiency syndrome (HCC) 07/28/2019  . Bradycardia 07/28/2019  . Hyponatremia 07/28/2019  . Nausea and vomiting 03/25/2013  . Muscle spasm 03/25/2013  . Drug use 01/11/2012  . 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 COUGH 06/23/2009  . CHEST PAIN, PLEURITIC 06/23/2009  . FUNGAL DERMATITIS 05/28/2008  . GERD 05/28/2008  . DENTAL CARIES 02/13/2008  . NICOTINE ADDICTION 11/14/2007  . BACK STRAIN, LUMBAR 08/22/2007  . 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 WEIGHT LOSS, RECENT 05/01/2007  . ANXIETY DEPRESSION 02/14/2007  . HIV DISEASE 02/13/2007  . CERVICAL LYMPHADENOPATHY, ANTERIOR, RIGHT 02/13/2007   No current facility-administered medications on file prior to encounter.    Current Outpatient Medications on File Prior to Encounter  Medication Sig Dispense Refill  . ondansetron (ZOFRAN ODT) 8 MG  disintegrating tablet Take 1 tablet (8 mg total) by mouth every 8 (eight) hours as needed for nausea or vomiting. 20 tablet 0  . ritonavir (NORVIR) 100 MG TABS tablet Take 1 tablet (100 mg total) by mouth daily. (Patient not taking: Reported on 01/22/2018) 30 tablet 5  . TRUVADA 200-300 MG per tablet TAKE 1 TABLET BY MOUTH DAILY (Patient not taking: Reported on 01/22/2018) 30 tablet 0  No Known Allergies     Exam:  VAsc (near) OD No light perception OS 20/25-2  Pupils: OD round, reactive, brisk +APD OS round, reactive, no APD  T(pen) OD 18 mm Hg OS 14 mm Hg  CVF:  OD NLP  OS Full to CF  EOM: full OU  Anterior Segment: Ext/Lids: wnl, no edema or erythema OU Conj/sclera: white/quiet OU, non-icteric OU K: clear OU AC: deep, grossly quiet OU Iris: round, flat OU Lens: Clear OU  Dilated with phenylephrine and tropicamide OU @ 1928 hours  Poor dilation OU after multiple rounds of dilating gtts with some limitation of peripheral views  DFE Vitreous: clear OU without snowballs or snowbanking Optic nerve:  OD sharp without significant edema, ?temporal pallor, c/d 0.3 OS pink and sharp without edema or pallor Macula: clear and flat OU Vessels: normal distribution OU Periphery: OD nasal 5-6 disc diameter area of retinitis around nasal blood vessel. Single dot blot hemorrhage anterior to area of retinitis. Otherwise flat and attached without other infiltrates. OS: flat and attached 360 without any visible infiltrates or  retinitis   IMP/PLAN:  1. Retinitis OD - pt with HIV and CD4 of 35, +RPR and +FTA-ABS - area of retinitis not consistent with afferent pupillary defect or degree of visual loss - ddx includes CMV retinitis (most likely based on appearance), syphilitic retinitis,  progressive outer retinal necrosis, toxoplasmosis - no retinitis noted in macula - recommend continuing treatment with IV ganciclovir for presumed CMV retinitis and penicillin at neurosyphillis  dosing for possible syphyllitic retinitis   2. 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

## 2019-07-29 NOTE — Consult Note (Signed)
[facility] for Infectious Disease    Date of Admission:  07/28/2019      Total days of antibiotics 0               Reason for Consult: Abnormal Brain MRI in AIDS patient     Referring Provider: Pahwani Primary Care Provider: Tommy Medal, Lavell Islam, MD    Assessment: Arthur [name] is a 38 y.o. male with HIV, AIDS+ (CD4 63; VL pending, off medications for years) here with dizziness and blurry vision with now more progressive vision loss of the right eye. Worried that he either has syphilis or CMV contributing to vision loss. Discussed with Dr. Doristine Bosworth - Ophthalmology will see the patient tonight for evaluation and neurology will see him tomorrow to perform LP in the late afternoon. Lovenox dose given ~3pm; will hold further doses and place SCDs for VTE prophylaxis.   Hold off on resuming HAART for now in lieu of understanding abnormal brain MRI. Will check cryptococcal serum antigen titer, toxo IgG and quantiferon.  Acute hepatitis panel negative as of yesterday.     Plan: 1. Appreciate ophthalmology to evaluate tonight  2. Start penicillin G 12 million units Q12h IV 3. Start Ganciclovir 5mg /kg IV Q12h  4. Start bactrim 1 DS QD for PJP proph 5. Hold on ART for now until we get more info 6. LP to be done by neurology tomorrow - studies entered. If possible will need large volume tap to run diagnostic studies including AFB, Cryptococcal PCR, Toxo PCR, CMV PCR, Cell count, Gram stain/culture, protein/glucose, VDRL. Please also document opening pressure.  7. Hold further lovenox doses for #6 8. Place SCDs  9. Further labs for HIV monitoring and hepatitis entered     Principal Problem:   Vision loss Active Problems:   Late syphilis   Acquired immunodeficiency syndrome ([place])   Abnormal brain MRI   Bradycardia   Hyponatremia   HIV (human immunodeficiency virus infection) ([place])   . sodium chloride flush  3 mL Intravenous Once  . sodium chloride flush  3 mL  Intravenous Q12H  . sulfamethoxazole-trimethoprim  1 tablet Oral Daily    HPI: Arthur [name] is a 38 y.o. male with pmhx including HIV, +AIDS, substance use, GERD, anxiety and depression.   Epic has been out of care for several years and off medications for his HIV. He recalls seeing two of our physicians at [facility] but last office visit was in 2015. About a week and a half now he noticed some dizziness and right eye changes to vision. He unfortunately now has had progressive complete loss of vision on the right eye. Other associated findings include dizziness and feeling off-balance. He has had no headaches, tinnitus, nausea/vomiting, neck pain or rigidity.   He is currently living with his sister and prior to this was homeless. He is not currently working. Remote history of substance use but currently only using marijuana occasionally. No injection drug use history.   He has never been treated for syphilis before and uncertain how he may have gotten it. He does estimate sexual partners over the last 12 months. Denies any previous genital/oral ulcers, rash on torso or hands/feet.   Review of Systems: Review of Systems  Constitutional: Negative for chills, diaphoresis, fever, malaise/fatigue and weight loss.  HENT: Negative for sore throat.   Eyes: Positive for blurred vision.       Loss of vision R eye   Respiratory:  Negative for cough, sputum production, shortness of breath and wheezing.   Cardiovascular: Negative for chest pain and leg swelling.  Gastrointestinal: Negative for abdominal pain, nausea and vomiting.  Genitourinary: Negative for dysuria.  Musculoskeletal: Negative for falls and neck pain.  Skin: Negative for itching and rash.  Neurological: Positive for dizziness. Negative for tingling, sensory change, speech change, focal weakness, seizures, loss of consciousness and headaches.    Past Medical History:  Diagnosis Date  . Anemia   . Anxiety   . Depression   . GERD  (gastroesophageal reflux disease)   . HIV infection (HCC)   . Substance abuse (HCC)     Social History   Tobacco Use  . Smoking status: Current Every Day Smoker    Packs/day: 0.40    Years: 17.00    Pack years: 6.80  . Smokeless tobacco: Never Used  Substance Use Topics  . Alcohol use: No    Comment: occassionally  . Drug use: Yes    Types: Marijuana    Family History  Problem Relation Age of Onset  . Diabetes Neg Hx   . CAD Neg Hx   . Hypertension Neg Hx    No Known Allergies  OBJECTIVE: Blood pressure 115/77, pulse (!) 53, temperature 97.8 F (36.6 C), temperature source Oral, resp. rate 15, height 5\' 11"  (1.803 m), weight 57 kg, SpO2 100 %.  Physical Exam Vitals signs and nursing note reviewed.  Constitutional:      General: He is not in acute distress.    Appearance: He is not ill-appearing.     Comments: Resting in bed. No distress.   HENT:     Mouth/Throat:     Mouth: Mucous membranes are dry.  Eyes:     Comments: Right pupil minimally reactive to bright light. He has no photosensitivity and in fact does not withdraw to bright light at all. Not injected. Normal EOMs.  Left pupil he withdraws to bright light, unable to determine pupil response with inability for him to keep eye open.   Neck:     Musculoskeletal: Normal range of motion. No neck rigidity or muscular tenderness.  Cardiovascular:     Rate and Rhythm: Normal rate and regular rhythm.     Pulses: Normal pulses.     Heart sounds: No murmur.  Abdominal:     General: Bowel sounds are normal. There is no distension.     Tenderness: There is no abdominal tenderness.  Musculoskeletal: Normal range of motion.  Lymphadenopathy:     Cervical: No cervical adenopathy.  Skin:    General: Skin is warm and dry.     Capillary Refill: Capillary refill takes less than 2 seconds.     Findings: No rash.     Comments: No rashes over torso or hands/feet  Neurological:     Mental Status: He is alert and oriented  to person, place, and time.  Psychiatric:        Mood and Affect: Mood normal.     Lab Results Lab Results  Component Value Date   WBC 2.7 (L) 07/29/2019   HGB 14.4 07/29/2019   HCT 42.6 07/29/2019   MCV 89.5 07/29/2019   PLT 240 07/29/2019    Lab Results  Component Value Date   CREATININE 0.87 07/29/2019   BUN 16 07/29/2019   NA 134 (L) 07/29/2019   K 4.4 07/29/2019   CL 104 07/29/2019   CO2 25 07/29/2019    Lab Results  Component Value Date  ALT 16 07/29/2019   AST 30 07/29/2019   ALKPHOS 49 07/29/2019   BILITOT 0.4 07/29/2019     Microbiology: Recent Results (from the past 240 hour(s))  SARS CORONAVIRUS 2 (TAT 6-24 HRS) Nasopharyngeal Nasopharyngeal Swab     Status: None   Collection Time: 07/28/19  8:24 PM   Specimen: Nasopharyngeal Swab  Result Value Ref Range Status   SARS Coronavirus 2 NEGATIVE NEGATIVE Final    Comment: (NOTE) SARS-CoV-2 target nucleic acids are NOT DETECTED. The SARS-CoV-2 RNA is generally detectable in upper and lower respiratory specimens during the acute phase of infection. Negative results do not preclude SARS-CoV-2 infection, do not rule out co-infections with other pathogens, and should not be used as the sole basis for treatment or other patient management decisions. Negative results must be combined with clinical observations, patient history, and epidemiological information. The expected result is Negative. Fact Sheet for Patients: HairSlick.no Fact Sheet for Healthcare Providers: quierodirigir.com This test is not yet approved or cleared by the [place] FDA and  has been authorized for detection and/or diagnosis of SARS-CoV-2 by FDA under an Emergency Use Authorization (EUA). This EUA will remain  in effect (meaning this test can be used) for the duration of the COVID-19 declaration under Section 56 4(b)(1) of the Act, 21 U.S.C. section 360bbb-3(b)(1), unless the  authorization is terminated or revoked sooner. Performed at [facility], [address] [place], [place] [address]     Rexene Alberts, MSN, NP-C [facility] for Infectious Disease [facility] Cell: [phone] Pager: [phone]  07/29/2019 4:26 PM

## 2019-07-29 NOTE — Evaluation (Signed)
Physical Therapy Evaluation Patient Details Name: Arthur [name] MRN: [id number] DOB: 1980/12/07 Today's Date: 07/29/2019   History of Present Illness  Arthur [name] is a 38 y.o. male with medical history significant of untreated HIV, anemia, depression, GERD, substance abuse presenting with c/o dizziness, headache and weakness.  He was admitted with hyponatremia, bradycardia and late syphilis.  Clinical Impression  Patient presents with decreased independence with mobility due to weakness, decreased activity tolerance, decreased balance, decreased safety awareness with high risk for falls.  Previously was independent at home and lives with sister who works.  Safest d/c would be STSNF, but if pt prefers home or does not qualify for SNF would need HHPT for home safety and HEP instruction.  PT to follow acutely.     Follow Up Recommendations SNF;Home health PT(depending on progress)    Equipment Recommendations  Rolling walker with 5" wheels    Recommendations for Other Services       Precautions / Restrictions Precautions Precautions: Fall Precaution Comments: watch BP, HR Restrictions Weight Bearing Restrictions: No      Mobility  Bed Mobility Overal bed mobility: Modified Independent                Transfers Overall transfer level: Needs assistance Equipment used: None Transfers: Sit to/from Stand;Stand Pivot Transfers Sit to Stand: Min assist Stand pivot transfers: Min assist       General transfer comment: to steady once standing as LOB and bracing legs against bed; patient up in recliner after ambulation, but endorses fatigue, SOB and dizzness requesting back to bed, pivot to bed min A for safety due to pt "crashing" onto bed  Ambulation/Gait Ambulation/Gait assistance: Min assist Gait Distance (Feet): 125 Feet Assistive device: Rolling walker (2 wheeled) Gait Pattern/deviations: Step-to pattern;Step-through pattern;Shuffle;Decreased stride length;Narrow  base of support     General Gait Details: Patient with short shuffling steps and leaning over to one side with turning with walker leaning into wall and min A for balance/safety  Stairs            Wheelchair Mobility    Modified Rankin (Stroke Patients Only)       Balance Overall balance assessment: Needs assistance Sitting-balance support: Feet supported Sitting balance-Leahy Scale: Good     Standing balance support: Bilateral upper extremity supported;No upper extremity supported Standing balance-Leahy Scale: Poor Standing balance comment: needed min A initially standing without UE support, needed walker for ambulation and assist even with walker for turns                             Pertinent Vitals/Pain Pain Assessment: No/denies pain    Home Living Family/patient expects to be discharged to:: Private residence Living Arrangements: Other relatives(sister) Available Help at Discharge: Family;Available PRN/intermittently Type of Home: Apartment Home Access: Level entry     Home Layout: One level Home Equipment: None      Prior Function Level of Independence: Independent               Hand Dominance   Dominant Hand: Right    Extremity/Trunk Assessment   Upper Extremity Assessment Upper Extremity Assessment: Overall WFL for tasks assessed    Lower Extremity Assessment Lower Extremity Assessment: Generalized weakness       Communication   Communication: Other (comment)(mumbles)  Cognition Arousal/Alertness: Awake/alert Behavior During Therapy: WFL for tasks assessed/performed Overall Cognitive Status: No family/caregiver present to determine baseline cognitive functioning  General Comments: alert and seemingly oriented, but decreased safety awareness and decreased awareness of deficits      General Comments General comments (skin integrity, edema, etc.): HR 101, BP supine 409  systolic, seated 735 systolic after ambulation 329 systolic    Exercises     Assessment/Plan    PT Assessment Patient needs continued PT services  PT Problem List Decreased strength;Decreased activity tolerance;Decreased mobility;Decreased safety awareness;Decreased knowledge of use of DME;Decreased balance;Decreased knowledge of precautions       PT Treatment Interventions DME instruction;Therapeutic activities;Balance training;Patient/family education;Therapeutic exercise;Functional mobility training;Gait training    PT Goals (Current goals can be found in the Care Plan section)  Acute Rehab PT Goals Patient Stated Goal: to get stronger, return to independent PT Goal Formulation: With patient Time For Goal Achievement: 08/12/19 Potential to Achieve Goals: Good    Frequency Min 3X/week   Barriers to discharge Decreased caregiver support      Co-evaluation               AM-PAC PT "6 Clicks" Mobility  Outcome Measure Help needed turning from your back to your side while in a flat bed without using bedrails?: None Help needed moving from lying on your back to sitting on the side of a flat bed without using bedrails?: None Help needed moving to and from a bed to a chair (including a wheelchair)?: A Little Help needed standing up from a chair using your arms (e.g., wheelchair or bedside chair)?: A Little Help needed to walk in hospital room?: A Little Help needed climbing 3-5 steps with a railing? : A Little 6 Click Score: 20    End of Session   Activity Tolerance: Patient limited by fatigue Patient left: in bed;with call bell/phone within reach   PT Visit Diagnosis: Other abnormalities of gait and mobility (R26.89);Muscle weakness (generalized) (M62.81)    Time: 9242-6834 PT Time Calculation (min) (ACUTE ONLY): 31 min   Charges:   PT Evaluation $PT Eval Moderate Complexity: 1 Mod PT Treatments $Gait Training: 8-22 mins        Magda Kiel, PT Acute  Rehabilitation Services [phone] 07/29/2019   Reginia Naas 07/29/2019, 10:06 AM

## 2019-07-29 NOTE — ED Notes (Signed)
Breakfast Ordered 

## 2019-07-29 NOTE — Progress Notes (Addendum)
PROGRESS NOTE    Arthur [name]  ZWC:[id number] DOB: July 23, 1981 DOA: 07/28/2019 PCP: Truman Hayward, MD   Brief Narrative:  Arthur [name] is a 38 y.o. male with medical history significant of untreated HIV, anemia, depression, GERD, substance abuse presented to ED on 07/28/2019 with a complaint of dizziness, weakness, intermittent headache.  He has not taken any of his HIV medications for about 1 year due to affordability issue.  MRI brain done in ED showed Widespread areas of abnormal low level restricted diffusion and postcontrast enhancement throughout both cerebral hemispheres, predominantly infratentorial, with the dominant abnormality in the LEFT anterior frontal white matter. In this immunocompromised patient, the findings are most consistent with an opportunistic infection, such as toxoplasmosis, tuberculosis or fungal cerebritis/meningitis, or parasitic infection.  Patient was admitted to hospital service and ID was consulted.  Assessment & Plan:   Active Problems:   Late syphilis   Acquired immunodeficiency syndrome (HCC)   Bradycardia   Hyponatremia  Unknown/hypersensitive intracranial infection in a patient with untreated HIV infection/dizziness: Leukopenia with very low CD4 count.  He is afebrile.  Hepatitis panel negative.  ID yet to see him and guide further work-up and management from here.  Mild hyponatremia: Improving.  Start gentle hydration.  Sinus bradycardia : He was seen in the ED on 07/22/2019 and EKG was done and he was not bradycardic.  Currently he is bradycardic but he does not have any symptoms.  Wonder if this is due to increased intracranial pressure.  Monitor on telemetry.  DVT prophylaxis: Lovenox Code Status: Full code Family Communication:  None present at bedside.  Plan of care discussed with patient in length and he verbalized understanding and agreed with it. Disposition Plan: TBD  Estimated body mass index is 17.53 kg/m as calculated  from the following:   Height as of this encounter: 5\' 11"  (1.803 m).   Weight as of this encounter: 57 kg.      Nutritional status:               Consultants:   ID  Procedures:   None  Antimicrobials:   None   Subjective: Patient seen and examined.  He continues to feel dizzy.  He also states that he cannot see well from his right eye.  Does not have any right eye pain.  On exam he does not have any red eye either.  No other complaint.  Objective: Vitals:   07/29/19 0400 07/29/19 0430 07/29/19 0500 07/29/19 0753  BP: 100/66 102/74 113/82 113/87  Pulse: (!) 52 (!) 52 (!) 59 (!) 45  Resp: (!) 31 16 15    Temp:    97.7 F (36.5 C)  TempSrc:    Oral  SpO2: 96% 99% 98% 100%  Weight:    57 kg  Height:    5\' 11"  (1.803 m)    Intake/Output Summary (Last 24 hours) at 07/29/2019 1240 Last data filed at 07/29/2019 1030 Gross per 24 hour  Intake 1800.41 ml  Output --  Net 1800.41 ml   Filed Weights   07/29/19 0753  Weight: 57 kg    Examination:  General exam: Appears calm and comfortable  Respiratory system: Clear to auscultation. Respiratory effort normal. Cardiovascular system: S1 & S2 heard, RRR. No JVD, murmurs, rubs, gallops or clicks. No pedal edema. Gastrointestinal system: Abdomen is nondistended, soft and nontender. No organomegaly or masses felt. Normal bowel sounds heard. Central nervous system: Alert and oriented. No focal neurological deficits. Extremities: Symmetric 5  x 5 power. Skin: No rashes, lesions or ulcers Psychiatry: Judgement and insight appear poor. Mood & affect flat.   Data Reviewed: I have personally reviewed following labs and imaging studies  CBC: Recent Labs  Lab 07/26/19 0915 07/28/19 1059 07/28/19 2201 07/29/19 0332  WBC 2.4* 2.5* 1.9* 2.7*  NEUTROABS  --   --  0.8*  --   HGB 15.1 15.3 14.2 14.4  HCT 46.2 46.3 42.8 42.6  MCV 91.1 90.8 90.7 89.5  PLT 299 312 272 240   Basic Metabolic Panel: Recent Labs  Lab  07/26/19 0915 07/28/19 1059 07/29/19 0332  NA 133* 132* 134*  K 4.2 4.3 4.4  CL 97* 99 104  CO2 26 25 25   GLUCOSE 95 89 96  BUN 18 14 16   CREATININE 1.13 1.01 0.87  CALCIUM 9.3 9.5 8.9  MG  --   --  2.0  PHOS  --   --  3.1   GFR: Estimated Creatinine Clearance: 94.6 mL/min (by C-G formula based on SCr of 0.87 mg/dL). Liver Function Tests: Recent Labs  Lab 07/26/19 0915 07/29/19 0332  AST 30 30  ALT 17 16  ALKPHOS 61 49  BILITOT 0.8 0.4  PROT 10.0* 8.3*  ALBUMIN 3.5 2.9*   No results for input(s): LIPASE, AMYLASE in the last 168 hours. No results for input(s): AMMONIA in the last 168 hours. Coagulation Profile: No results for input(s): INR, PROTIME in the last 168 hours. Cardiac Enzymes: No results for input(s): CKTOTAL, CKMB, CKMBINDEX, TROPONINI in the last 168 hours. BNP (last 3 results) No results for input(s): PROBNP in the last 8760 hours. HbA1C: No results for input(s): HGBA1C in the last 72 hours. CBG: No results for input(s): GLUCAP in the last 168 hours. Lipid Profile: No results for input(s): CHOL, HDL, LDLCALC, TRIG, CHOLHDL, LDLDIRECT in the last 72 hours. Thyroid Function Tests: Recent Labs    07/28/19 2219  TSH 0.605   Anemia Panel: No results for input(s): VITAMINB12, FOLATE, FERRITIN, TIBC, IRON, RETICCTPCT in the last 72 hours. Sepsis Labs: No results for input(s): PROCALCITON, LATICACIDVEN in the last 168 hours.  Recent Results (from the past 240 hour(s))  SARS CORONAVIRUS 2 (TAT 6-24 HRS) Nasopharyngeal Nasopharyngeal Swab     Status: None   Collection Time: 07/28/19  8:24 PM   Specimen: Nasopharyngeal Swab  Result Value Ref Range Status   SARS Coronavirus 2 NEGATIVE NEGATIVE Final    Comment: (NOTE) SARS-CoV-2 target nucleic acids are NOT DETECTED. The SARS-CoV-2 RNA is generally detectable in upper and lower respiratory specimens during the acute phase of infection. Negative results do not preclude SARS-CoV-2 infection, do not rule  out co-infections with other pathogens, and should not be used as the sole basis for treatment or other patient management decisions. Negative results must be combined with clinical observations, patient history, and epidemiological information. The expected result is Negative. Fact Sheet for Patients: HairSlick.nohttps://www.fda.gov/media/138098/download Fact Sheet for Healthcare Providers: quierodirigir.comhttps://www.fda.gov/media/138095/download This test is not yet approved or cleared by the [place] FDA and  has been authorized for detection and/or diagnosis of SARS-CoV-2 by FDA under an Emergency Use Authorization (EUA). This EUA will remain  in effect (meaning this test can be used) for the duration of the COVID-19 declaration under Section 56 4(b)(1) of the Act, 21 U.S.C. section 360bbb-3(b)(1), unless the authorization is terminated or revoked sooner. Performed at [facility], [address] [place], [place] [address]       Radiology Studies: Ct Head Wo  Contrast  Result Date: 07/28/2019 CLINICAL DATA:  Generalized weakness and dizziness for the last 2 weeks. Denies pain or injury EXAM: CT HEAD WITHOUT CONTRAST TECHNIQUE: Contiguous axial images were obtained from the base of the skull through the vertex without intravenous contrast. COMPARISON:  CT, 01/22/2016. FINDINGS: Brain: There are small hyperattenuating foci along the cerebellum, new when compared to the prior CT. There is no defined mass and no mass effect. The ventricles are normal in configuration, but larger than expected for this patient's age, and larger than on the prior CT. No hydrocephalus. No evidence of an infarct.  There is no intracranial hemorrhage. Vascular: No hyperdense vessel or unexpected calcification. Skull: Normal. Negative for fracture or focal lesion. Sinuses/Orbits: Globes and orbits are unremarkable. Mild ethmoid and sphenoid sinus mucosal thickening. Clear mastoid air cells. Other: None. IMPRESSION: 1. Small  foci of increased attenuation within the cerebellum. Possible etiologies include cirrhosis, infection and metastatic disease. 2. Mild ventricular enlargement greater than expected for this patient's age and increased in size compared to the prior CT. 3. No other intracranial abnormalities. 4. Recommend follow-up MRI of the brain without and with contrast further assessment. Electronically Signed   By: Amie Portland M.D.   On: 07/28/2019 13:24   Mr Laqueta Jean And Wo Contrast  Result Date: 07/28/2019 CLINICAL DATA:  Unexplained altered level of consciousness. Generalized weakness with dizziness and decreased appetite for 2 weeks. HIV. EXAM: MRI HEAD WITHOUT AND WITH CONTRAST TECHNIQUE: Multiplanar, multiecho pulse sequences of the brain and surrounding structures were obtained without and with intravenous contrast. CONTRAST:  67mL GADAVIST GADOBUTROL 1 MMOL/ML IV SOLN COMPARISON:  CT head earlier today. FINDINGS: Brain: Widespread areas of abnormal low level restricted diffusion are predominantly infratentorial, affecting the cerebellar cortex, white matter, and leptomeningeal spaces including the prepontine cistern. Other similar non confluent cortical and subcortical white matter lesions are seen throughout both cerebral hemispheres with the dominant abnormality in the LEFT anterior frontal white matter. These lesions are largely T2 and FLAIR hyperintense and demonstrate abnormal postcontrast enhancement. No midline shift. Generalized atrophy. Minor foci of white matter disease elsewhere, premature for age but not clearly acute. Vascular: Normal flow voids. Skull and upper cervical spine: Normal marrow signal. Sinuses/Orbits: Negative. Other: None. Correlating with the patient's earlier CT, the areas of increased attenuation in the cerebellum are favored to represent calcification rather than hemorrhage. IMPRESSION: 1. Widespread areas of abnormal low level restricted diffusion and postcontrast enhancement  throughout both cerebral hemispheres, predominantly infratentorial, with the dominant abnormality in the LEFT anterior frontal white matter. In this immunocompromised patient, the findings are most consistent with an opportunistic infection, such as toxoplasmosis, tuberculosis or fungal cerebritis/meningitis, or parasitic infection. Infectious disease consultation is warranted. 2. Generalized atrophy with mild small vessel disease elsewhere, premature for age but not clearly acute. 3. No midline shift or acute hydrocephalus. Electronically Signed   By: Elsie Stain M.D.   On: 07/28/2019 17:28    Scheduled Meds:  sodium chloride flush  3 mL Intravenous Once   sodium chloride flush  3 mL Intravenous Q12H   Continuous Infusions:  sodium chloride 10 mL/hr at 07/29/19 0742     LOS: 0 days   Time spent: 30 minutes   Hughie Closs, MD [facility]  07/29/2019, 12:40 PM   To contact the attending provider between 7A-7P or the covering provider during after hours 7P-7A, please log into the web site [email or web address] and use password [email or web address].    ADDENDUM: Received a secure chat from  ID that patient needs urgent ophthalmology evaluation due to right-sided vision loss.  I consulted ophthalmologist Dr. Sherrine Maples who stated that he will see patient today after office hours.  ID is going to call neurology to perform LP.

## 2019-07-29 NOTE — ED Notes (Signed)
Tele

## 2019-07-30 LAB — PROTIME-INR
INR: 1.1 (ref 0.8–1.2)
Prothrombin Time: 13.6 seconds (ref 11.4–15.2)

## 2019-07-30 LAB — CBC WITH DIFFERENTIAL/PLATELET
Abs Immature Granulocytes: 0 10*3/uL (ref 0.00–0.07)
Basophils Absolute: 0 10*3/uL (ref 0.0–0.1)
Basophils Relative: 1 %
Eosinophils Absolute: 0 10*3/uL (ref 0.0–0.5)
Eosinophils Relative: 1 %
HCT: 39.7 % (ref 39.0–52.0)
Hemoglobin: 13.5 g/dL (ref 13.0–17.0)
Lymphocytes Relative: 22 %
Lymphs Abs: 0.4 10*3/uL — ABNORMAL LOW (ref 0.7–4.0)
MCH: 29.9 pg (ref 26.0–34.0)
MCHC: 34 g/dL (ref 30.0–36.0)
MCV: 87.8 fL (ref 80.0–100.0)
Monocytes Absolute: 0.2 10*3/uL (ref 0.1–1.0)
Monocytes Relative: 11 %
Neutro Abs: 1.3 10*3/uL — ABNORMAL LOW (ref 1.7–7.7)
Neutrophils Relative %: 65 %
Platelets: 255 10*3/uL (ref 150–400)
RBC: 4.52 MIL/uL (ref 4.22–5.81)
RDW: 13.1 % (ref 11.5–15.5)
WBC: 2 10*3/uL — ABNORMAL LOW (ref 4.0–10.5)
nRBC: 0 % (ref 0.0–0.2)
nRBC: 0 /100 WBC

## 2019-07-30 LAB — BASIC METABOLIC PANEL
Anion gap: 8 (ref 5–15)
BUN: 9 mg/dL (ref 6–20)
CO2: 23 mmol/L (ref 22–32)
Calcium: 9 mg/dL (ref 8.9–10.3)
Chloride: 99 mmol/L (ref 98–111)
Creatinine, Ser: 0.85 mg/dL (ref 0.61–1.24)
GFR calc Af Amer: >60 mL/min (ref >60)
GFR calc non Af Amer: >60 mL/min (ref >60)
Glucose, Bld: 103 mg/dL — ABNORMAL HIGH (ref 70–99)
Potassium: 4.2 mmol/L (ref 3.5–5.1)
Sodium: 130 mmol/L — ABNORMAL LOW (ref 135–145)

## 2019-07-30 LAB — HIV-1 RNA QUANT-NO REFLEX-BLD
HIV 1 RNA Quant: 73100 copies/mL
LOG10 HIV-1 RNA: 4.864 log10copy/mL

## 2019-07-30 LAB — TOXOPLASMA GONDII ANTIBODY, IGG: Toxoplasma IgG Ratio: >400 IU/mL — ABNORMAL HIGH (ref 0.0–7.1)

## 2019-07-30 LAB — CSF CELL COUNT WITH DIFFERENTIAL
Eosinophils, CSF: 0 % (ref 0–1)
Lymphs, CSF: 96 % — ABNORMAL HIGH (ref 40–80)
Monocyte-Macrophage-Spinal Fluid: 4 % — ABNORMAL LOW (ref 15–45)
RBC Count, CSF: 0 /mm3
Segmented Neutrophils-CSF: 0 % (ref 0–6)
Tube #: 1
WBC, CSF: 64 /mm3 (ref 0–5)

## 2019-07-30 LAB — CRYPTOCOCCAL ANTIGEN, CSF: Crypto Ag: NEGATIVE

## 2019-07-30 LAB — PROTEIN AND GLUCOSE, CSF
Glucose, CSF: 23 mg/dL — CL (ref 40–70)
Total  Protein, CSF: >600 mg/dL — ABNORMAL HIGH (ref 15–45)

## 2019-07-30 LAB — MAGNESIUM: Magnesium: 1.7 mg/dL (ref 1.7–2.4)

## 2019-07-30 MED ORDER — PYRAZINAMIDE 500 MG PO TABS
1500.0000 mg | ORAL_TABLET | Freq: Every day | ORAL | Status: DC
  Administered 2019-07-30 – 2019-08-03 (×5): 1500 mg via ORAL
  Filled 2019-07-30 (×8): qty 3

## 2019-07-30 MED ORDER — RIFAMPIN 300 MG PO CAPS
600.0000 mg | ORAL_CAPSULE | Freq: Every day | ORAL | Status: DC
  Administered 2019-07-30 – 2019-08-03 (×5): 600 mg via ORAL
  Filled 2019-07-30 (×8): qty 2

## 2019-07-30 MED ORDER — ETHAMBUTOL HCL 400 MG PO TABS
1200.0000 mg | ORAL_TABLET | Freq: Every day | ORAL | Status: DC
  Administered 2019-07-30 – 2019-08-03 (×5): 1200 mg via ORAL
  Filled 2019-07-30 (×8): qty 3

## 2019-07-30 MED ORDER — ISONIAZID 300 MG PO TABS
300.0000 mg | ORAL_TABLET | Freq: Every day | ORAL | Status: DC
  Administered 2019-07-30 – 2019-08-03 (×5): 300 mg via ORAL
  Filled 2019-07-30 (×8): qty 1

## 2019-07-30 MED ORDER — LORAZEPAM 2 MG/ML IJ SOLN
0.5000 mg | Freq: Once | INTRAMUSCULAR | Status: AC
  Administered 2019-07-31: 0.5 mg via INTRAVENOUS
  Filled 2019-07-30: qty 1

## 2019-07-30 MED ORDER — VITAMIN B-6 50 MG PO TABS
50.0000 mg | ORAL_TABLET | Freq: Every day | ORAL | Status: DC
  Administered 2019-07-30 – 2019-08-03 (×5): 50 mg via ORAL
  Filled 2019-07-30 (×8): qty 1

## 2019-07-30 MED ORDER — SODIUM CHLORIDE 0.9 % IV SOLN
INTRAVENOUS | Status: DC
  Administered 2019-07-30 – 2019-08-01 (×4): via INTRAVENOUS

## 2019-07-30 NOTE — Progress Notes (Deleted)
Indication: Opportunistic infection  Risks of the procedure were dicussed with the patient including post-LP headache, bleeding, infection, weakness/numbness of legs(radiculopathy), death.  The patient and patient's proxy agreed and written consent was obtained.   The patient was prepped and draped, and using sterile technique a 20 gauge quinke spinal needle was inserted in the 3/4 space. The opening pressure was 13 mm Hg. Approximately 16 cc of CSF were obtained and sent for analysis.   Lovenox was held with last dose given 07/29/2019 at 1453  PT and INR were obtained.-PT 13.6 and INR 1.1  No complications were encountered.  Etta Quill PA-C [facility] [phone]  M-F  (9:00 am- 5:00 PM)  07/30/2019, 2:00 PM

## 2019-07-30 NOTE — Procedures (Addendum)
Indication: Opportunistic infection  Risks of the procedure were dicussed with the patient including post-LP headache, bleeding, infection, weakness/numbness of legs(radiculopathy), death.  The patient and patient's proxy agreed and written consent was obtained.   The patient was prepped and draped, and using sterile technique a 20 gauge quinke spinal needle was inserted in the 3/4 space. The opening pressure was 13 mm Hg. Approximately 16 cc of CSF were obtained and sent for analysis.   Lovenox was held with last dose given 07/29/2019 at 1453  PT and INR were obtained.-PT 13.6 and INR 1.1  No complications were encountered.  Etta Quill PA-C [facility] [phone]  M-F  (9:00 am- 5:00 PM)  07/30/2019, 2:00 PM  Attending Neurohospitalist Addendum Present for the entire duration of procedure. Mother in the room for supporting patient who was very scared and emotional regarding the procedure. ID will follow.  -- Amie Portland, MD [facility] Pager: [phone]  If 7pm to 7am, please call on call as listed on AMION.

## 2019-07-30 NOTE — Progress Notes (Signed)
Tele monitor sitter was placed per protocol. Will continue to monitor pt.

## 2019-07-30 NOTE — Progress Notes (Signed)
[facility]  Date of Admission:  07/28/2019      Total days of antibiotics 2  Day 2 penicillin g   Day 2 ganciclovir            ASSESSMENT: Arthur [name] is a 38 y.o. male with advanced HIV, +AIDS with recent CD4 57 here with vision loss of the right eye with associated dizziness; also found to have an abnormal brain MRI concerning for OI.  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 Uncertain if neurosyphilis would present with MRI findings - will review lit.   Will hold on toxo treatment for now pending LP findings. Review of MRI shows a more isolated larger lesion and a few scattered smaller ones. No discernable ring enhancement; infact the primary lesion almost appears air filled.  Appreciate neurology to perform - please document opening pressure at the time of study; will need large volume tap if possible to perform all diagnostics. Studies have been entered. Would anticipate need to repeat brain MRI in 1 week to follow for treatment response.   Will work on getting him med access through [employer]. Last regimen documented was Darunavir/r + Truvada. Will continue to hold for now.    PLAN: 1. Continue ganciclovir IV  2. Continue penicillin IV 3. Follow LP studies  4. Hold ART for now   Principal Problem:   Vision loss Active Problems:   Late syphilis   Acquired immunodeficiency syndrome (HCC)   Abnormal brain MRI   Bradycardia   Hyponatremia   HIV (human immunodeficiency virus infection) (HCC)   . sodium chloride flush  3 mL Intravenous Once  . sodium chloride flush  3 mL Intravenous Q12H  . sulfamethoxazole-trimethoprim  1 tablet Oral Daily    SUBJECTIVE: No changes to right eye. Otherwise no complaints/questions.      Review of Systems: Review of Systems  Constitutional: Negative for chills and fever.  Eyes:       R eye vision loss  Respiratory: Negative for cough and wheezing.   Gastrointestinal: Negative for abdominal pain, diarrhea and nausea.  Musculoskeletal: Negative for neck pain.  Skin: Negative for rash.  Neurological: Negative for dizziness, seizures, weakness and headaches.    No Known Allergies  OBJECTIVE: Vitals:   07/30/19 0644 07/30/19 0645 07/30/19 0829 07/30/19 0832  BP: 105/78 105/78 108/77 108/77  Pulse: (!) 47   (!) 56  Resp: 17   19  Temp:   98 F (36.7 C)   TempSrc:   Oral   SpO2: 97%   100%  Weight:      Height:       Body mass index is 17.71 kg/m.  Physical Exam Constitutional:      Comments: Resting in bed quietly.   HENT:     Mouth/Throat:     Mouth: Mucous membranes are moist.     Pharynx: Oropharynx is clear.  Eyes:     General: No scleral icterus.    Pupils: Pupils are equal, round, and reactive to light.  Cardiovascular:     Rate and Rhythm: Normal rate and regular rhythm.  Pulmonary:     Effort: Pulmonary effort is normal.  Musculoskeletal: Normal range of motion.  Skin:    General: Skin  is warm and dry.     Capillary Refill: Capillary refill takes less than 2 seconds.     Findings: No rash.  Neurological:     Mental Status: He is alert and oriented to person, place, and time.     Lab Results Lab Results  Component Value Date   WBC 2.7 (L) 07/29/2019   HGB 14.4 07/29/2019   HCT 42.6 07/29/2019   MCV 89.5 07/29/2019   PLT 240 07/29/2019    Lab Results  Component Value Date   CREATININE 0.87 07/29/2019   BUN 16 07/29/2019   NA 134 (L) 07/29/2019   K 4.4 07/29/2019   CL 104 07/29/2019   CO2 25 07/29/2019    Lab Results  Component Value Date   ALT 16 07/29/2019   AST 30 07/29/2019   ALKPHOS 49 07/29/2019   BILITOT 0.4 07/29/2019     Microbiology: Recent Results (from the past 240 hour(s))  SARS CORONAVIRUS 2 (TAT 6-24  HRS) Nasopharyngeal Nasopharyngeal Swab     Status: None   Collection Time: 07/28/19  8:24 PM   Specimen: Nasopharyngeal Swab  Result Value Ref Range Status   SARS Coronavirus 2 NEGATIVE NEGATIVE Final    Comment: (NOTE) SARS-CoV-2 target nucleic acids are NOT DETECTED. The SARS-CoV-2 RNA is generally detectable in upper and lower respiratory specimens during the acute phase of infection. Negative results do not preclude SARS-CoV-2 infection, do not rule out co-infections with other pathogens, and should not be used as the sole basis for treatment or other patient management decisions. Negative results must be combined with clinical observations, patient history, and epidemiological information. The expected result is Negative. Fact Sheet for Patients: SugarRoll.be Fact Sheet for Healthcare Providers: https://www.woods-mathews.com/ This test is not yet approved or cleared by the [place] FDA and  has been authorized for detection and/or diagnosis of SARS-CoV-2 by FDA under an Emergency Use Authorization (EUA). This EUA will remain  in effect (meaning this test can be used) for the duration of the COVID-19 declaration under Section 56 4(b)(1) of the Act, 21 U.S.C. section 360bbb-3(b)(1), unless the authorization is terminated or revoked sooner. Performed at [facility], [place] [address], [place], [place] [address]      Janene Madeira, MSN, NP-C [facility] [email or web address] Pager: [phone] Office: [place]: [phone]

## 2019-07-30 NOTE — TOC Initial Note (Signed)
Transition of Care [facility]) - Initial/Assessment Note    Patient Details  Name: Arthur [name] MRN: [id number] Date of Birth: September 19, 1981  Transition of [facility]) CM/SW Contact:    Eileen Stanford, LCSW Phone Number: 07/30/2019, 2:45 PM  Clinical Narrative:    Pt is alert and oriented. Pt states he will d/c home with his sister and she will be with him 24/7 to assist in care. Pt states he would rather go home with services then go to SNF. Pt is uninsured. CSW will reach out to [facility] who has charity this week--pt agreeable.               Expected Discharge Plan: [place] Barriers to Discharge: Continued Medical Work up   Patient Goals and CMS Choice Patient states their goals for this hospitalization and ongoing recovery are:: " to go home with sister"   Choice offered to / list presented to : NA  Expected Discharge Plan and Services Expected Discharge Plan: [place] In-house Referral: NA   Post Acute Care Choice: [place] arrangements for the past 2 months: Single Family Home                           HH Arranged: PT, OT HH Agency: [facility] Date [place]: 07/30/19 Time [place]: [place] Representative spoke with at [place] Arrangements/Services Living arrangements for the past 2 months: Arthur with:: Siblings Patient language and need for interpreter reviewed:: Yes Do you feel safe going back to the place where you live?: Yes      Need for Family Participation in Patient Care: Yes (Comment) Care giver support system in place?: Yes (comment)   Criminal Activity/Legal Involvement Pertinent to Current Situation/Hospitalization: No - Comment as needed  Activities of Daily Living      Permission Sought/Granted Permission sought to share information with : Family Supports Permission granted to share information with : Yes, Verbal Permission  Granted  Share Information with NAME: [name]  Permission granted to share info w AGENCY: [facility]  Permission granted to share info w Relationship: mother     Emotional Assessment Appearance:: Appears stated age Attitude/Demeanor/Rapport: Sedated, Lethargic Affect (typically observed): Flat, Quiet Orientation: : Oriented to Self, Oriented to Place, Oriented to  Time, Oriented to Situation Alcohol / Substance Use: Not Applicable Psych Involvement: No (comment)  Admission diagnosis:  Dizziness [R42] Acquired immunodeficiency syndrome ([place]) [B20] Patient Active Problem List   Diagnosis Date Noted  . HIV (human immunodeficiency virus infection) ([place]) 07/29/2019  . Vision loss 07/29/2019  . Abnormal brain MRI 07/29/2019  . Acquired immunodeficiency syndrome ([place]) 07/28/2019  . Bradycardia 07/28/2019  . Hyponatremia 07/28/2019  . Nausea and vomiting 03/25/2013  . Muscle spasm 03/25/2013  . Drug use 01/11/2012  . 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 COUGH 06/23/2009  . CHEST PAIN, PLEURITIC 06/23/2009  . FUNGAL DERMATITIS 05/28/2008  . GERD 05/28/2008  . DENTAL CARIES 02/13/2008  . NICOTINE ADDICTION 11/14/2007  . BACK STRAIN, LUMBAR 08/22/2007  . 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 WEIGHT LOSS, RECENT 05/01/2007  . ANXIETY DEPRESSION 02/14/2007  . HIV DISEASE 02/13/2007  . CERVICAL LYMPHADENOPATHY, ANTERIOR, RIGHT 02/13/2007   PCP:  Tommy Medal, Lavell Islam, MD Pharmacy:   [id number] - [place], [place] [address] [place] [address]  Phone: [phone] Fax: [phone]     Social Determinants of Health (SDOH) Interventions    Readmission Risk Interventions No flowsheet data found.

## 2019-07-30 NOTE — Progress Notes (Signed)
PROGRESS NOTE    Arthur [name]  TZG:[id number] DOB: 1981/09/01 DOA: 07/28/2019 PCP: Randall Hiss, MD   Brief Narrative:  Arthur [name] is a 38 y.o. male with medical history significant of untreated HIV, anemia, depression, GERD, substance abuse presented to ED on 07/28/2019 with a complaint of dizziness, weakness, intermittent headache.  He has not taken any of his HIV medications for about 1 year due to affordability issue.  MRI brain done in ED showed Widespread areas of abnormal low level restricted diffusion and postcontrast enhancement throughout both cerebral hemispheres, predominantly infratentorial, with the dominant abnormality in the LEFT anterior frontal white matter. In this immunocompromised patient, the findings are most consistent with an opportunistic infection, such as toxoplasmosis, tuberculosis or fungal cerebritis/meningitis, or parasitic infection.  Patient was admitted to hospital service and ID was consulted.  He then revealed to [place] that he has lost his vision in his right eye.  ID started him on penicillin and ganciclovir for possible CMV retinitis versus synovitis retinitis and ophthalmology also saw him.  He is going to have LP by neurology today.  Assessment & Plan:   Principal Problem:   Vision loss Active Problems:   Late syphilis   Acquired immunodeficiency syndrome (HCC)   Bradycardia   Hyponatremia   HIV (human immunodeficiency virus infection) (HCC)   Abnormal brain MRI  Unknown/hypersensitive intracranial infection in a patient with untreated HIV infection/dizziness: Leukopenia with very low CD4 count.  He is afebrile.  Hepatitis panel negative.  Plan for LP by neurology today for further investigation.  Significantly elevated toxoplasma IgG.  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  On ganciclovir and high-dose penicillins per ID.   Seen by ophthalmology.  Mild hyponatremia: Mild and stable.  Normal saline continue.  Sinus bradycardia : He was seen in the ED on 07/22/2019 and EKG was done and he was not bradycardic.  Currently he is bradycardic but he does not have any symptoms.  Wonder if this is due to increased intracranial pressure.  Monitor on telemetry.  DVT prophylaxis: Lovenox (held today due to pending LP) Code Status: Full code Family Communication:  None present at bedside.  Plan of care discussed with patient in length and he verbalized understanding and agreed with it. Disposition Plan: TBD  Estimated body mass index is 17.71 kg/m as calculated from the following:   Height as of this encounter: 5\' 11"  (1.803 m).   Weight as of this encounter: 57.6 kg.      Nutritional status:               Consultants:   ID  Neurology  Ophthalmology  Procedures:   None  Antimicrobials:   None   Subjective: Seen and examined.  Overall he states that he feels better and dizziness is improving but there is no change in vision in right eye. no other complaint.  Objective: Vitals:   07/30/19 0644 07/30/19 0645 07/30/19 0829 07/30/19 0832  BP: 105/78 105/78 108/77 108/77  Pulse: (!) 47   (!) 56  Resp: 17   19  Temp:   98 F (36.7 C)   TempSrc:   Oral   SpO2: 97%   100%  Weight:      Height:        Intake/Output Summary (Last 24 hours) at 07/30/2019 1349 Last data filed at 07/30/2019 0600 Gross per 24 hour  Intake 862.74  ml  Output 400 ml  Net 462.74 ml   Filed Weights   07/29/19 0753 07/30/19 0546  Weight: 57 kg 57.6 kg    Examination:  General exam: Appears calm and comfortable  Respiratory system: Clear to auscultation. Respiratory effort normal. Cardiovascular system: S1 & S2 heard, RRR. No JVD, murmurs, rubs, gallops or clicks. No pedal edema. Gastrointestinal system: Abdomen is nondistended, soft and nontender. No organomegaly or masses felt. Normal bowel sounds  heard. Central nervous system: Alert and oriented. No focal neurological deficits. Extremities: Symmetric 5 x 5 power.  Extensive callus on both feet and nails with possible fungal superinfection. Skin: No rashes, lesions or ulcers.  Psychiatry: Judgement and insight appear poor. Mood & affect flat.   Data Reviewed: I have personally reviewed following labs and imaging studies  CBC: Recent Labs  Lab 07/26/19 0915 07/28/19 1059 07/28/19 2201 07/29/19 0332 07/30/19 1113  WBC 2.4* 2.5* 1.9* 2.7* 2.0*  NEUTROABS  --   --  0.8*  --  1.3*  HGB 15.1 15.3 14.2 14.4 13.5  HCT 46.2 46.3 42.8 42.6 39.7  MCV 91.1 90.8 90.7 89.5 87.8  PLT 299 312 272 240 956   Basic Metabolic Panel: Recent Labs  Lab 07/26/19 0915 07/28/19 1059 07/29/19 0332 07/30/19 1113  NA 133* 132* 134* 130*  K 4.2 4.3 4.4 4.2  CL 97* 99 104 99  CO2 26 25 25 23   GLUCOSE 95 89 96 103*  BUN 18 14 16 9   CREATININE 1.13 1.01 0.87 0.85  CALCIUM 9.3 9.5 8.9 9.0  MG  --   --  2.0 1.7  PHOS  --   --  3.1  --    GFR: Estimated Creatinine Clearance: 97.9 mL/min (by C-G formula based on SCr of 0.85 mg/dL). Liver Function Tests: Recent Labs  Lab 07/26/19 0915 07/29/19 0332  AST 30 30  ALT 17 16  ALKPHOS 61 49  BILITOT 0.8 0.4  PROT 10.0* 8.3*  ALBUMIN 3.5 2.9*   No results for input(s): LIPASE, AMYLASE in the last 168 hours. No results for input(s): AMMONIA in the last 168 hours. Coagulation Profile: Recent Labs  Lab 07/30/19 0852  INR 1.1   Cardiac Enzymes: No results for input(s): CKTOTAL, CKMB, CKMBINDEX, TROPONINI in the last 168 hours. BNP (last 3 results) No results for input(s): PROBNP in the last 8760 hours. HbA1C: No results for input(s): HGBA1C in the last 72 hours. CBG: No results for input(s): GLUCAP in the last 168 hours. Lipid Profile: No results for input(s): CHOL, HDL, LDLCALC, TRIG, CHOLHDL, LDLDIRECT in the last 72 hours. Thyroid Function Tests: Recent Labs    07/28/19 2219   TSH 0.605   Anemia Panel: No results for input(s): VITAMINB12, FOLATE, FERRITIN, TIBC, IRON, RETICCTPCT in the last 72 hours. Sepsis Labs: No results for input(s): PROCALCITON, LATICACIDVEN in the last 168 hours.  Recent Results (from the past 240 hour(s))  SARS CORONAVIRUS 2 (TAT 6-24 HRS) Nasopharyngeal Nasopharyngeal Swab     Status: None   Collection Time: 07/28/19  8:24 PM   Specimen: Nasopharyngeal Swab  Result Value Ref Range Status   SARS Coronavirus 2 NEGATIVE NEGATIVE Final    Comment: (NOTE) SARS-CoV-2 target nucleic acids are NOT DETECTED. The SARS-CoV-2 RNA is generally detectable in upper and lower respiratory specimens during the acute phase of infection. Negative results do not preclude SARS-CoV-2 infection, do not rule out co-infections with other pathogens, and should not be used as the sole basis for treatment or other  patient management decisions. Negative results must be combined with clinical observations, patient history, and epidemiological information. The expected result is Negative. Fact Sheet for Patients: HairSlick.nohttps://www.fda.gov/media/138098/download Fact Sheet for Healthcare Providers: quierodirigir.comhttps://www.fda.gov/media/138095/download This test is not yet approved or cleared by the [place] FDA and  has been authorized for detection and/or diagnosis of SARS-CoV-2 by FDA under an Emergency Use Authorization (EUA). This EUA will remain  in effect (meaning this test can be used) for the duration of the COVID-19 declaration under Section 56 4(b)(1) of the Act, 21 U.S.C. section 360bbb-3(b)(1), unless the authorization is terminated or revoked sooner. Performed at [facility], [address] [place], [place] [address]       Radiology Studies: Mr Laqueta JeanBrain W And Wo Contrast  Result Date: 07/28/2019 CLINICAL DATA:  Unexplained altered level of consciousness. Generalized weakness with dizziness and decreased appetite for 2 weeks. HIV. EXAM: MRI HEAD  WITHOUT AND WITH CONTRAST TECHNIQUE: Multiplanar, multiecho pulse sequences of the brain and surrounding structures were obtained without and with intravenous contrast. CONTRAST:  6mL GADAVIST GADOBUTROL 1 MMOL/ML IV SOLN COMPARISON:  CT head earlier today. FINDINGS: Brain: Widespread areas of abnormal low level restricted diffusion are predominantly infratentorial, affecting the cerebellar cortex, white matter, and leptomeningeal spaces including the prepontine cistern. Other similar non confluent cortical and subcortical white matter lesions are seen throughout both cerebral hemispheres with the dominant abnormality in the LEFT anterior frontal white matter. These lesions are largely T2 and FLAIR hyperintense and demonstrate abnormal postcontrast enhancement. No midline shift. Generalized atrophy. Minor foci of white matter disease elsewhere, premature for age but not clearly acute. Vascular: Normal flow voids. Skull and upper cervical spine: Normal marrow signal. Sinuses/Orbits: Negative. Other: None. Correlating with the patient's earlier CT, the areas of increased attenuation in the cerebellum are favored to represent calcification rather than hemorrhage. IMPRESSION: 1. Widespread areas of abnormal low level restricted diffusion and postcontrast enhancement throughout both cerebral hemispheres, predominantly infratentorial, with the dominant abnormality in the LEFT anterior frontal white matter. In this immunocompromised patient, the findings are most consistent with an opportunistic infection, such as toxoplasmosis, tuberculosis or fungal cerebritis/meningitis, or parasitic infection. Infectious disease consultation is warranted. 2. Generalized atrophy with mild small vessel disease elsewhere, premature for age but not clearly acute. 3. No midline shift or acute hydrocephalus. Electronically Signed   By: Elsie StainJohn T Curnes M.D.   On: 07/28/2019 17:28    Scheduled Meds:  sodium chloride flush  3 mL Intravenous  Once   sodium chloride flush  3 mL Intravenous Q12H   sulfamethoxazole-trimethoprim  1 tablet Oral Daily   Continuous Infusions:  sodium chloride 10 mL/hr at 07/29/19 0742   ganciclovir 285 mg (07/30/19 1036)   penicillin g continuous IV infusion 12 Million Units (07/30/19 0434)     LOS: 1 day   Time spent: 29 minutes   Hughie Clossavi Gwyn Mehring, MD [facility]  07/30/2019, 1:49 PM   To contact the attending provider between 7A-7P or the covering provider during after hours 7P-7A, please log into the web site [email or web address] and use password [email or web address].

## 2019-07-30 NOTE — Progress Notes (Signed)
Called in the pt's room by the Nurse Tech with pt's pulling the monitor leads and 2 peripheral IV's. Pt is alert oriented x 4, denies chest pain, denies nausea and vomiting, not in respiratory distress. Will continue to monitor pt.

## 2019-07-30 NOTE — Progress Notes (Signed)
I appreciate neurology help with this difficult case.  Results reviewed and very high protein, low glucose, lymphocytes.  I am also concerned about CNS Tb so will start empiric tx. His CXR is negative so no indication for Tb isolation Thayer Headings, MD

## 2019-07-30 NOTE — Progress Notes (Addendum)
CRITICAL VALUE ALERT  Critical Value:  CSF Glucose 23   CSF WBC 64  Date & Time Notied: 07/30/19 1500  Provider Notified: Doristine Bosworth, MD & Doren Custard, NP  Orders Received/Actions taken: No orders received.

## 2019-07-30 NOTE — Evaluation (Signed)
Occupational Therapy Evaluation Patient Details Name: Arthur [name] MRN: [id number] DOB: 1981-01-23 Today's Date: 07/30/2019    History of Present Illness Arthur [name] is a 38 y.o. male with medical history significant of untreated HIV, anemia, depression, GERD, substance abuse presenting with c/o dizziness, headache and weakness.  He was admitted with hyponatremia, bradycardia and late syphilis.   Clinical Impression   Patient presenting with decreased I in self care, functional mobility/transfers, balance, endurance, strength, and safety awareness. Patient reports being independent PTA. Patient currently functioning at min A overall and fatigues very quickly. Patient will benefit from acute OT to increase overall independence in the areas of ADLs, functional mobility, and safety awareness in order to safely discharge home vs next venue of care. If family able to provide supervision and assistance pt could go home based on progress.    Follow Up Recommendations  SNF;Home health OT;Supervision/Assistance - 24 hour    Equipment Recommendations  3 in 1 bedside commode       Precautions / Restrictions Precautions Precautions: Fall Precaution Comments: watch BP, HR Restrictions Weight Bearing Restrictions: No      Mobility Bed Mobility Overal bed mobility: Modified Independent     Transfers Overall transfer level: Needs assistance Equipment used: None Transfers: Sit to/from Stand;Stand Pivot Transfers Sit to Stand: Min assist Stand pivot transfers: Min assist       General transfer comment: pt reports being fatigue after ambulating 10' in room with RW and min A and requests to return    Balance Overall balance assessment: Needs assistance Sitting-balance support: Feet supported Sitting balance-Leahy Scale: Good     Standing balance support: Bilateral upper extremity supported;No upper extremity supported Standing balance-Leahy Scale: Poor Standing balance comment:  min A for UB support        ADL either performed or assessed with clinical judgement   ADL Overall ADL's : Needs assistance/impaired Eating/Feeding: Set up;Sitting   Grooming: Wash/dry hands;Wash/dry face;Oral care;Applying deodorant;Sitting;Set up   Upper Body Bathing: Set up;Sitting   Lower Body Bathing: Minimal assistance;Sit to/from stand   Upper Body Dressing : Set up;Sitting   Lower Body Dressing: Minimal assistance;Sit to/from stand   Toilet Transfer: Minimal assistance   Toileting- Clothing Manipulation and Hygiene: Minimal assistance         Vision Baseline Vision/History: No visual deficits Patient Visual Report: Other (comment)(pt reports no vision in R eye) Vision Assessment?: Vision impaired- to be further tested in functional context Additional Comments: pt would not allow therapist to formally assess            Pertinent Vitals/Pain Pain Assessment: No/denies pain     Hand Dominance Right   Extremity/Trunk Assessment Upper Extremity Assessment Upper Extremity Assessment: Generalized weakness   Lower Extremity Assessment Lower Extremity Assessment: Generalized weakness       Communication Communication Communication: Other (comment)   Cognition Arousal/Alertness: Awake/alert Behavior During Therapy: WFL for tasks assessed/performed Overall Cognitive Status: No family/caregiver present to determine baseline cognitive functioning           General Comments: decreased safety awareness and insight to deficits              Home Living Family/patient expects to be discharged to:: Private residence Living Arrangements: Other relatives Available Help at Discharge: Family;Available PRN/intermittently Type of Home: Apartment Home Access: Level entry     Home Layout: One level     Bathroom Shower/Tub: Engineer, production Accessibility: Yes   Home Equipment: None  Prior Functioning/Environment Level of  Independence: Independent                 OT Problem List: Decreased strength;Decreased coordination;Decreased activity tolerance;Impaired balance (sitting and/or standing);Decreased safety awareness;Pain      OT Treatment/Interventions: Self-care/ADL training;Therapeutic exercise;Manual therapy;Patient/family education;Balance training;Energy conservation;Therapeutic activities;DME and/or AE instruction    OT Goals(Current goals can be found in the care plan section) Acute Rehab OT Goals Patient Stated Goal: to get better OT Goal Formulation: With patient Time For Goal Achievement: 08/13/19 Potential to Achieve Goals: Good ADL Goals Pt Will Perform Upper Body Bathing: with supervision Pt Will Perform Lower Body Bathing: with supervision Pt Will Perform Upper Body Dressing: with supervision Pt Will Perform Lower Body Dressing: with supervision Pt Will Transfer to Toilet: with supervision Pt Will Perform Toileting - Clothing Manipulation and hygiene: with supervision Pt Will Perform Tub/Shower Transfer: with supervision  OT Frequency: Min 2X/week   Barriers to D/C:    none known at this time          AM-PAC OT "6 Clicks" Daily Activity     Outcome Measure Help from another person eating meals?: None Help from another person taking care of personal grooming?: A Little Help from another person toileting, which includes using toliet, bedpan, or urinal?: A Little Help from another person bathing (including washing, rinsing, drying)?: A Little Help from another person to put on and taking off regular upper body clothing?: A Little Help from another person to put on and taking off regular lower body clothing?: A Little 6 Click Score: 19   End of Session Equipment Utilized During Treatment: Rolling walker Nurse Communication: Mobility status  Activity Tolerance: Patient limited by fatigue Patient left: in bed;with bed alarm set  OT Visit Diagnosis: Muscle weakness  (generalized) (M62.81)                Time: 3710-6269 OT Time Calculation (min): 16 min Charges:  OT General Charges $OT Visit: 1 Visit OT Evaluation $OT Eval Low Complexity: 1 Low  Arthur [name] P MS, OTR/L 07/30/2019, 12:46 PM

## 2019-07-31 DIAGNOSIS — R451 Restlessness and agitation: Secondary | ICD-10-CM

## 2019-07-31 DIAGNOSIS — R410 Disorientation, unspecified: Secondary | ICD-10-CM

## 2019-07-31 DIAGNOSIS — H3091 Unspecified chorioretinal inflammation, right eye: Secondary | ICD-10-CM

## 2019-07-31 DIAGNOSIS — G939 Disorder of brain, unspecified: Secondary | ICD-10-CM

## 2019-07-31 LAB — QUANTIFERON-TB GOLD PLUS (RQFGPL)
QuantiFERON Mitogen Value: 5.75 IU/mL
QuantiFERON Nil Value: 0.11 IU/mL
QuantiFERON TB1 Ag Value: 0.11 IU/mL
QuantiFERON TB2 Ag Value: 0.11 IU/mL

## 2019-07-31 LAB — QUANTIFERON-TB GOLD PLUS: QuantiFERON-TB Gold Plus: NEGATIVE

## 2019-07-31 LAB — HSV DNA BY PCR (REFERENCE LAB)
HSV 1 DNA: NEGATIVE
HSV 2 DNA: NEGATIVE

## 2019-07-31 LAB — VDRL, CSF: VDRL Quant, CSF: 1:64 {titer} — ABNORMAL HIGH

## 2019-07-31 LAB — PATHOLOGIST SMEAR REVIEW: Path Review: INCREASED

## 2019-07-31 MED ORDER — INFLUENZA VAC SPLIT QUAD 0.5 ML IM SUSY
0.5000 mL | PREFILLED_SYRINGE | INTRAMUSCULAR | Status: AC
  Administered 2019-08-02: 0.5 mL via INTRAMUSCULAR
  Filled 2019-07-31: qty 0.5

## 2019-07-31 MED ORDER — PREDNISOLONE SODIUM PHOSPHATE 15 MG/5ML PO SOLN
30.0000 mg | Freq: Every day | ORAL | Status: DC
  Filled 2019-07-31: qty 10

## 2019-07-31 MED ORDER — LORAZEPAM 2 MG/ML IJ SOLN
0.5000 mg | Freq: Once | INTRAMUSCULAR | Status: AC
  Administered 2019-07-31: 0.5 mg via INTRAVENOUS
  Filled 2019-07-31: qty 1

## 2019-07-31 MED ORDER — HALOPERIDOL LACTATE 5 MG/ML IJ SOLN
1.0000 mg | Freq: Four times a day (QID) | INTRAMUSCULAR | Status: DC | PRN
  Administered 2019-07-31 – 2019-08-27 (×12): 1 mg via INTRAVENOUS
  Filled 2019-07-31 (×14): qty 1

## 2019-07-31 MED ORDER — CYCLOPENTOLATE HCL 1 % OP SOLN
1.0000 [drp] | OPHTHALMIC | Status: AC
  Administered 2019-07-31 (×2): 1 [drp] via OPHTHALMIC
  Filled 2019-07-31: qty 2

## 2019-07-31 MED ORDER — SODIUM CHLORIDE 0.9 % IV SOLN
25.0000 mg | Freq: Every day | INTRAVENOUS | Status: AC
  Administered 2019-07-31 – 2019-08-06 (×6): 25 mg via INTRAVENOUS
  Filled 2019-07-31 (×3): qty 2.5
  Filled 2019-07-31: qty 2
  Filled 2019-07-31 (×3): qty 2.5

## 2019-07-31 MED ORDER — ENSURE ENLIVE PO LIQD
237.0000 mL | Freq: Two times a day (BID) | ORAL | Status: DC
  Administered 2019-08-01 – 2019-08-02 (×2): 237 mL via ORAL

## 2019-07-31 NOTE — TOC Progression Note (Signed)
Transition of Care [facility]) - Progression Note    Patient Details  Name: [name] MRN: [id number] Date of Birth: [birth year]  Transition of Care [facility]) CM/SW Contact  Eileen Stanford, LCSW Phone Number: 07/31/2019, 11:43 AM  Clinical Narrative:  CSW has been working with supervisor Olga Coaster to determine if pt would be able to get [facility]. It has been determined that pt would not be appropriate for [facility]. Pt would be more appropriate for SNF as he is considering "custodial care." Pt is disoriented today. CSW called pt's mother to let her know pt would not qualify for [facility] and pt's mother states she would like to wait until closer to d/c to determine d/c plan.     Expected Discharge Plan: [place] Barriers to Discharge: Continued Medical Work up  Expected Discharge Plan and Services Expected Discharge Plan: [place] In-house Referral: NA   Post Acute Care Choice: [place] arrangements for the past 2 months: Single Family Home                           HH Arranged: PT, OT HH Agency: [facility] Date [place]: 07/30/19 Time [place]: 1442 Representative spoke with at [place]: [name]   Social Determinants of Health ([place]) Interventions    Readmission Risk Interventions No flowsheet data found.

## 2019-07-31 NOTE — Progress Notes (Signed)
PT Cancellation Note  Patient Details Name: Arthur [name] MRN: [id number] DOB: 1981-01-18   Cancelled Treatment:    Reason Eval/Treat Not Completed: Other (comment). Pt very restless and confused last night and now sleeping. Will defer at this time. Will continue to follow.    Shary Decamp [place] 07/31/2019, 2:09 PM [place] Pager [phone] Office [phone]

## 2019-07-31 NOTE — Progress Notes (Addendum)
PROGRESS NOTE    Arthur ParrMichael J Boliver  ZOX:[id number] DOB: 06/14/1981 DOA: 07/28/2019 PCP: Randall HissVan Dam, Cornelius N, MD   Brief Narrative:  Arthur [name] is a 38 y.o. male with medical history significant of untreated HIV, anemia, depression, GERD, substance abuse presented to ED on 07/28/2019 with a complaint of dizziness, weakness, intermittent headache.  He has not taken any of his HIV medications for about 1 year due to affordability issue.  MRI brain done in ED showed Widespread areas of abnormal low level restricted diffusion and postcontrast enhancement throughout both cerebral hemispheres, predominantly infratentorial, with the dominant abnormality in the LEFT anterior frontal white matter. In this immunocompromised patient, the findings are most consistent with an opportunistic infection, such as toxoplasmosis, tuberculosis or fungal cerebritis/meningitis, or parasitic infection.  Patient was admitted to hospital service and ID was consulted.  He then revealed to us that he has lost his vision in his right eye.   ID started him on penicillin and ganciclovir for possible CMV retinitis versus synovitis retinitis and ophthalmology also saw him.  07/30/2019-status post lumbar puncture by neurology he ---------------------------------------------------------------------------------------------------------------------------------------------  Subjective: The patient was seen and examined this morning.  Was agitated, following command.  Nursing staff reported overnight patient was quite agitated confused, sitter said to be requested. Also received 1 dose of Ativan.  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   Assessment & Plan:   Principal Problem:   Vision loss Active Problems:   Late syphilis   Acquired immunodeficiency syndrome (HCC)   Bradycardia   Hyponatremia   HIV (human immunodeficiency virus infection) (HCC)   Abnormal brain MRI  Meningitis/intracranial infection/bleeding  till right eye blindness Unknown/hypersensitive intracranial infection in a patient with untreated HIV infection/dizziness: Encephalopathic likely due to intracranial infection ID concerning differential no TB meningitis -Status post LP 07/30/2019: low glucose < 50% serum and very elevated protein > 600. WBC 60s,  -Remains afebrile, normotensive but confused, - Leukopenia with very low CD4 count.   Hepatitis panel negative.   Significantly elevated toxoplasma IgG.   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  On ganciclovir and high-dose penicillins per ID.  Seen by ophthalmology.  Mild hyponatremia: Serum sodium 1-1 30, continue normal saline  Sinus bradycardia : -Improved - He was seen in the ED on 07/22/2019 and EKG was done and he was not bradycardic.  -We will continue to monitor closely    DVT prophylaxis: Lovenox (was held LP) Code Status: Full code Family Communication:  None present at bedside.  Plan of care discussed with patient in length and he verbalized understanding and agreed with it. Disposition Plan: TBD  Estimated body mass index is 17.71 kg/m as calculated from the following:   Height as of this encounter: 5\' 11"  (1.803 m).   Weight as of this encounter: 57.6 kg.      Nutritional status: Poor p.o. 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) (+) serum Toxo IgG  opthal more c/w CMV retinitis Cryptococcal serum Ag (-) Cryptococcal CSF Ag (-)  AFB smear/culture and other diagnostic studies    Objective: Vitals:   07/30/19 2100 07/31/19 0004 07/31/19 0657 07/31/19 0735  BP: 96/85 106/83 126/68 (!) 123/97  Pulse: (!) 54 (!) 53 (!) 55 76  Resp: Temp: 98.1 F (36.7 C) 98.3 F (36.8 C) 98 F (36.7 C) 98.4 F (36.9 C)  TempSrc: Oral Axillary Axillary Axillary  SpO2: 100%     Weight:      Height:        Intake/Output Summary (Last 24 hours) at 07/31/2019 1153 Last data filed at 07/31/2019 1100 Gross per 24 hour  Intake 1482.66 ml  Output 1000 ml  Net 482.66 ml   Filed Weights   07/29/19 0753 07/30/19 0546  Weight: 57 kg 57.6 kg    Examination:   General Appearance:    Alert, cooperative, no distress, appears stated age  Head:    Normocephalic, without obvious abnormality, atraumatic  Eyes:   Left eye total blindness, PERRL, conjunctiva/corneas clear, EOM's intact, fundi    benign, both eyes  Ears:    Normal TM's and external ear canals, both ears  Nose:   Nares normal, septum midline, mucosa normal, no drainage    or sinus tenderness  Throat:   Lips, mucosa, and tongue normal; teeth and gums normal  Neck:   Supple, symmetrical, trachea midline, no adenopathy;    thyroid:  no enlargement/tenderness/nodules; no carotid   bruit or JVD  Back:     Symmetric, no curvature, ROM normal, no CVA tenderness  Lungs:     Clear to auscultation bilaterally, respirations unlabored  Chest Wall:    No tenderness or deformity   Heart:    Regular rate and rhythm, S1 and S2 normal, no murmur, rub   or gallop     Abdomen:     Soft, non-tender, bowel sounds active all four quadrants,    no masses, no organomegaly        Extremities:   Extremities normal, atraumatic, no cyanosis or edema  Pulses:   2+ and symmetric all extremities  Skin:   Skin  color, texture, turgor normal, no rashes or lesions  Lymph nodes:   Cervical, supraclavicular, and axillary nodes normal  Neurologic:   CNII-XII intact, normal strength, sensation and reflexes  Confused agitated, able to move all 4 extremities      Data Reviewed: I have personally reviewed following labs and imaging studies  CBC: Recent Labs  Lab 07/26/19 0915 07/28/19 1059 07/28/19 2201 07/29/19 0332 07/30/19 1113  WBC 2.4* 2.5* 1.9* 2.7* 2.0*  NEUTROABS  --   --  0.8*  --  1.3*  HGB 15.1 15.3 14.2 14.4 13.5  HCT 46.2 46.3 42.8 42.6 39.7  MCV 91.1 90.8 90.7 89.5 87.8  PLT 299 312 272 240 255   Basic Metabolic Panel: Recent Labs  Lab 07/26/19 0915 07/28/19 1059 07/29/19 0332 07/30/19 1113  NA 133* 132* 134* 130*  K 4.2 4.3 4.4 4.2  CL 97* 99 104 99  CO2 GLUCOSE 95 89 96 103*  BUN CREATININE 1.13 1.01 0.87 0.85  CALCIUM 9.3 9.5 8.9 9.0  MG  --   --  2.0 1.7  PHOS  --   --  3.1  --    GFR: Estimated Creatinine  Clearance: 97.9 mL/min (by C-G formula based on SCr of 0.85 mg/dL). Liver Function Tests: Recent Labs  Lab 07/26/19 0915 07/29/19 0332  AST 30 30  ALT 17 16  ALKPHOS 61 49  BILITOT 0.8 0.4  PROT 10.0* 8.3*  ALBUMIN 3.5 2.9*   No results for input(s): LIPASE, AMYLASE in the last 168 hours. No results for input(s): AMMONIA in the last 168 hours. Coagulation Profile: Recent Labs  Lab 07/30/19 0852  INR 1.1   Cardiac Recent Labs    07/28/19 2219  TSH 0.605   Anemia Panel: No results for input(s): VITAMINB12, FOLATE, FERRITIN, TIBC, IRON, RETICCTPCT in the last 72 hours. Sepsis Labs: No results for input(s): PROCALCITON, LATICACIDVEN in the last 168 hours.  Recent Results (from the past 240 hour(s))  SARS CORONAVIRUS 2 (TAT 6-24 HRS) Nasopharyngeal Nasopharyngeal Swab     Status: None   Collection Time: 07/28/19  8:24 PM   Specimen: Nasopharyngeal Swab  Result Value Ref Range Status   SARS Coronavirus 2 NEGATIVE  NEGATIVE Final    Comment: (NOTE) SARS-CoV-2 target nucleic acids are NOT DETECTED. The SARS-CoV-2 RNA is generally detectable in upper and lower respiratory specimens during the acute phase of infection. Negative results do not preclude SARS-CoV-2 infection, do not rule out co-infections with other pathogens, and should not be used as the sole basis for treatment or other patient management decisions. Negative results must be combined with clinical observations, patient history, and epidemiological information. The expected result is Negative. Fact Sheet for Patients: HairSlick.no Fact Sheet for Healthcare Providers: quierodirigir.com This test is not yet approved or cleared by the [place] FDA and  has been authorized for detection and/or diagnosis of SARS-CoV-2 by FDA under an Emergency Use Authorization (EUA). This EUA will remain  in effect (meaning this test can be used) for the duration of the COVID-19 declaration under Section 56 4(b)(1) of the Act, 21 U.S.C. section 360bbb-3(b)(1), unless the authorization is terminated or revoked sooner. Performed at [facility], [address] [place], [place] [address]   CSF culture with Stat gram stain     Status: None (Preliminary result)   Collection Time: 07/30/19  1:46 PM   Specimen: CSF; Cerebrospinal Fluid  Result Value Ref Range Status   Specimen Description CSF  Final   Special Requests NONE  Final   Gram Stain   Final    WBC PRESENT, PREDOMINANTLY MONONUCLEAR NO ORGANISMS SEEN CYTOSPIN SMEAR    Culture   Final    NO GROWTH < 24 HOURS Performed at [facility], [address], [place], [place] [address]    Report Status PENDING  Incomplete      Radiology Studies: No results found.  Scheduled Meds: . ethambutol  1,200 mg Oral Daily  . [START ON 08/01/2019] influenza vac split quadrivalent PF  0.5 mL Intramuscular Tomorrow-1000  . isoniazid   300 mg Oral Daily  . pyrazinamide  1,500 mg Oral Daily  . vitamin B-6  50 mg Oral Daily  . rifampin  600 mg Oral Daily  . sodium chloride flush  3 mL Intravenous Once  . sodium chloride flush  3 mL Intravenous Q12H  . sulfamethoxazole-trimethoprim  1 tablet Oral Daily   Continuous Infusions: . sodium chloride 10 mL/hr at 07/29/19 0742  . sodium chloride 100 mL/hr at 07/31/19 0159  . dexamethasone (DECADRON) IVPB ([facility])    . ganciclovir Stopped (07/30/19 2311)  . penicillin g continuous IV infusion 12 Million Units (07/31/19  1610)     LOS: 2 days   Time spent: 29 minutes   Deatra James, MD [facility]  07/31/2019, 11:53 AM   To contact the attending provider between 7A-7P or the covering provider during after hours 7P-7A, please log into the web site [email or web address] and use password [email or web address].

## 2019-07-31 NOTE — Progress Notes (Signed)
Pt has periods of generalized muscle rigidity and intermittent confusion but able to follow commands. VS stable. Seen and examined by rapid response. MD on call was notified and ordered a sitter. Will continue to monitor pt.

## 2019-07-31 NOTE — Progress Notes (Signed)
Pt is restless, trying to get out of the bed and pulled IV multiple times. MD on call notified and ordered one time dose of IV Ativan 0.5 mg. Will continue to monitor pt.

## 2019-07-31 NOTE — Progress Notes (Signed)
Subjective: pt reportedly became agitated, confused, disoriented and now has a sitter at bedside. He states he does not see any light out of the OD.   Exam: VAsc (near) OD No light perception OS 20/400 (post dilation and patient disoriented, limited participation)  Pupils: Round, dilated OU  T(pen) OD 19 mm Hg OS 9 mm Hg   Anterior Segment: Ext/Lids: wnl, no edema or erythema OU Conj/sclera: white/quiet OU, non-icteric OU K: clear OU AC: deep, grossly quiet OU Iris: round, flat OU Lens: Clear OU  Poor dilation OU  DFE Vitreous: clear OU without snowballs or snowbanking Optic nerve:  OD sharp without significant edema, ?temporal pallor, c/d 0.3 OS pink and sharp without edema or pallor c/d 0.3 Macula: clear and flat OU Vessels: normal distribution OU Periphery: OD nasal 5-6 disc diameter area of retinitis around nasal blood vessel. Single dot blot hemorrhage anterior to area of retinitis. Otherwise flat and attached without other infiltrates. Appears mostly stable in size with possible slight increased nasal extension. OS: flat and attached 360 without any visible infiltrates or retinitis   IMP/PLAN:  1. Optic neuropathy OD - severe visual loss (NLP) with brisk afferent pupillary defect is typically consistent with optic nerve pathology - suspect optic nerve lesion (Ddx includes infiltrative, infectious, ischemic) in more posterior optic nerve (anterior lesions typically have disc edema and no significant disc edema noted on exam) - The patient's was NLP OD on presentation and I think has a poor prognosis in terms of any resolution or improvement in vision OD. - Recent MRI brain without any optic nerve pathology noted, discussed with Dr. Jeralyn Ruths neuroradiology to review who noted no nerve enhancement on the MRI of the brain. MRI of the orbits may give additional view of the nerve but this may not change any management in the immediate short term, though can consider  pending follow-up results of LP  2. Retinitis OD - pt with HIV and CD4 of 35, +RPR and +FTA-ABS - area of retinitis not consistent with afferent pupillary defect or degree of visual loss - ddx includes CMV retinitis (most likely based on appearance), syphilitic retinitis,  progressive outer retinal necrosis, toxoplasmosis, TB retinitis (ID concerned for TB meningitis-treatment initiated including ethambutol) - no retinitis noted in macula - recommend continuing treatment with IV ganciclovir for presumed CMV retinitis and penicillin at neurosyphillis dosing for possible syphyllitic retinitis   Lonia Skinner, MD [facility] [phone]

## 2019-07-31 NOTE — Significant Event (Signed)
Rapid Response Event Note  Overview:  Called by bedside RN for pt with increased restlessness and confusion.    Initial Focused Assessment: On arrival, pt laying prone in bed. Would not answer questions verbally when asked but would nod appropriately. PT very restless and confused attempting to take off gown. (RN stated he has removed multiple PIVs). HR 67, BP 107/88, pt does not appear to be in distress and denies pain on assessment.   Unsure if confusion and restlessness is stemming from infectious process. Pt receiving IV Penicillin and Ganciclovir.   Plan of Care (if not transferred): Continue to monitor pt. Bedside and charge RN informed of need for safety sitter for pt to prevent falls or harm to patient. Instructed to call with any changes or concerns.   Event Summary:  Called at  0241   Event ended at  [place]

## 2019-07-31 NOTE — Progress Notes (Signed)
[facility]  Date of Admission:  07/28/2019      Total days of antibiotics 3  Day 3 penicillin g   Day 3 ganciclovir   Day 1 RIPE + Pyridoxine            ASSESSMENT: Arthur ParrMichael J Jelley is a 38 y.o. male with advanced HIV, +AIDS with recent CD4 1935 with vision loss of the right eye with associated dizziness and abnormal brain MRI with few scattered small lesions and one larger left frontal lobe lesion.  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 WBC 60s, however he is severely immunocompromised. Gram stain negative as is day 1 of cultures. Opening pressure is normal. No meningismus on exam. No fever. Highly suspicious for TB CNS infection and RIPE started while awaiting AFB smear/culture and other diagnostic studies. Uncertain if the vision loss/retinitis in the Right eye is same process or alternative - it is possible he has multiple things going on regarding opportunistic infections given his low CD4. Serum Quantiferon pending however possibility of false negative high given immune system.  Holding on presumptive toxo treatment for now.   Confusion, restlessness -  With his confusion worsening may need to consider adding steroids (would be Dexamethosone 12mg /kg daily for 3 weeks with a taper over the next 3-5 weeks). Will discuss with neurology colleagues.     HIV / AIDS - Hold ART minimum 2 weeks for now given CNS infection with risk for IRIS.   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 Uncertain if this is recoverable as we are not 100% certain of the cause at this time.    PLAN: 1. Continue ganciclovir IV  2. Continue penicillin IV  3. Continue Rifampin, isoniazid, pyrazinamide, ethambutol, pyridoxine 4. Will consider adding steroids daily 5. Follow LP studies  6. Hold ART for now   Principal Problem:   Vision loss Active Problems:   Late syphilis   Acquired immunodeficiency syndrome (HCC)   Abnormal brain MRI   Bradycardia   Hyponatremia   HIV (human immunodeficiency virus infection) (HCC)   . cyclopentolate  1 drop Both Eyes Q5 min  . ethambutol  1,200 mg Oral Daily  . isoniazid  300 mg Oral Daily  . pyrazinamide  1,500 mg Oral Daily  . vitamin B-6  50 mg Oral Daily  . rifampin  600 mg Oral Daily  . sodium chloride flush  3 mL Intravenous Once  . sodium chloride flush  3 mL Intravenous Q12H  . sulfamethoxazole-trimethoprim  1 tablet Oral Daily    SUBJECTIVE: No improvements. He remains very quiet during interview and only shaking head yes/no for questions. No neck pain/stiffness, headaches, worsened vision changes, ataxia or LE weakness.   Interval Additions -  WBC 2.0, afebrile Started on RIPE for suspicion TB meningitis with tuberculoma on MRI  Noted moments of confusion O/N Rapid Response notified to evaluate for this - noted to be very restless and pulling out IVs and removing gown.    Review of Systems: Review of Systems  Constitutional: Negative for chills and fever.  Eyes:       R eye vision loss  Respiratory: Negative for cough and wheezing.   Gastrointestinal: Negative for abdominal pain, diarrhea and nausea.  Musculoskeletal: Negative for neck pain.  Skin: Negative for rash.  Neurological: Negative for dizziness, seizures, weakness and headaches.    No Known Allergies  OBJECTIVE: Vitals:   07/30/19 2100 07/31/19 0004 07/31/19 0657 07/31/19 0735  BP: 96/85 106/83 126/68 (!) 123/97  Pulse: (!) 54 (!) 53 (!) 55 76  Resp: 18  18 20   Temp: 98.1 F (36.7 C) 98.3 F (36.8 C) 98 F (36.7 C) 98.4 F (36.9 C)  TempSrc: Oral Axillary Axillary Axillary  SpO2: 100%     Weight:       Height:       Body mass index is 17.71 kg/m.  Physical Exam Constitutional:      Comments: Prone in bed getting assisted bath.   HENT:     Mouth/Throat:     Mouth: Mucous membranes are moist.     Pharynx: Oropharynx is clear.  Eyes:     General: No scleral icterus.    Pupils: Pupils are equal, round, and reactive to light.  Cardiovascular:     Rate and Rhythm: Normal rate and regular rhythm.  Pulmonary:     Effort: Pulmonary effort is normal.  Musculoskeletal: Normal range of motion.  Skin:    General: Skin is warm and dry.     Capillary Refill: Capillary refill takes less than 2 seconds.     Findings: No rash.  Neurological:     Mental Status: He is alert and oriented to person, place, and time.     Lab Results Lab Results  Component Value Date   WBC 2.0 (L) 07/30/2019   HGB 13.5 07/30/2019   HCT 39.7 07/30/2019   MCV 87.8 07/30/2019   PLT 255 07/30/2019    Lab Results  Component Value Date   CREATININE 0.85 07/30/2019   BUN 9 07/30/2019   NA 130 (L) 07/30/2019   K 4.2 07/30/2019   CL 99 07/30/2019   CO2 23 07/30/2019    Lab Results  Component Value Date   ALT 16 07/29/2019   AST 30 07/29/2019   ALKPHOS 49 07/29/2019   BILITOT 0.4 07/29/2019     Microbiology: Recent Results (from the past 240 hour(s))  SARS CORONAVIRUS 2 (TAT 6-24 HRS) Nasopharyngeal Nasopharyngeal Swab     Status: None   Collection Time: 07/28/19  8:24 PM   Specimen: Nasopharyngeal Swab  Result Value Ref Range Status   SARS Coronavirus 2 NEGATIVE NEGATIVE Final    Comment: (NOTE) SARS-CoV-2 target nucleic acids are NOT DETECTED. The SARS-CoV-2 RNA is generally detectable in upper and lower respiratory specimens during the acute phase of infection. Negative results do not preclude SARS-CoV-2 infection, do not rule out co-infections with other pathogens, and should not be used as the sole basis for treatment or other patient management decisions. Negative results must be combined  with clinical observations, patient history, and epidemiological information. The expected result is Negative. Fact Sheet for Patients: 13/01/20 Fact Sheet for Healthcare Providers: HairSlick.no This test is not yet approved or cleared by the [place] FDA and  has been authorized for detection and/or diagnosis of SARS-CoV-2 by FDA under an Emergency Use Authorization (EUA). This EUA will remain  in effect (meaning this test can be used) for the duration of the COVID-19 declaration under Section 56 4(b)(1) of the Act, 21 U.S.C. section 360bbb-3(b)(1), unless the authorization is terminated or revoked sooner. Performed at [facility], [address] [place], [place] [address]   CSF culture with Stat gram stain     Status: None (Preliminary result)   Collection Time: 07/30/19  1:46 PM   Specimen: CSF; Cerebrospinal Fluid  Result Value Ref Range Status   Specimen Description CSF  Final   Special Requests NONE  Final   Gram Stain   Final    WBC PRESENT, PREDOMINANTLY MONONUCLEAR NO ORGANISMS SEEN CYTOSPIN SMEAR    Culture   Final    NO GROWTH < 24 HOURS Performed at [facility], [place] [address], [place], [place] [address]    Report Status PENDING  Incomplete     Janene Madeira, MSN, NP-C [facility] [email or web address] Pager: [phone] Office: [phone] [place]: [phone]

## 2019-07-31 NOTE — Progress Notes (Signed)
Telephone call from patient's mother, Sloan Takagi, updated on plan of care and answered questions.  She requested MD to call her at home as she will not be in today due to visitor restrictions and sister to visit.  Dr.  Roger Shelter notified of mother's request for call to update.

## 2019-08-01 DIAGNOSIS — A530 Latent syphilis, unspecified as early or late: Secondary | ICD-10-CM

## 2019-08-01 DIAGNOSIS — A523 Neurosyphilis, unspecified: Secondary | ICD-10-CM

## 2019-08-01 DIAGNOSIS — G92 Toxic encephalopathy: Secondary | ICD-10-CM

## 2019-08-01 LAB — COMPREHENSIVE METABOLIC PANEL
ALT: 14 U/L (ref 0–44)
AST: 24 U/L (ref 15–41)
Albumin: 2.6 g/dL — ABNORMAL LOW (ref 3.5–5.0)
Alkaline Phosphatase: 45 U/L (ref 38–126)
Anion gap: 7 (ref 5–15)
BUN: 13 mg/dL (ref 6–20)
CO2: 19 mmol/L — ABNORMAL LOW (ref 22–32)
Calcium: 9 mg/dL (ref 8.9–10.3)
Chloride: 103 mmol/L (ref 98–111)
Creatinine, Ser: 0.88 mg/dL (ref 0.61–1.24)
GFR calc Af Amer: >60 mL/min (ref >60)
GFR calc non Af Amer: >60 mL/min (ref >60)
Glucose, Bld: 109 mg/dL — ABNORMAL HIGH (ref 70–99)
Potassium: 4.7 mmol/L (ref 3.5–5.1)
Sodium: 129 mmol/L — ABNORMAL LOW (ref 135–145)
Total Bilirubin: 0.6 mg/dL (ref 0.3–1.2)
Total Protein: 7.9 g/dL (ref 6.5–8.1)

## 2019-08-01 LAB — CBC
HCT: 39.2 % (ref 39.0–52.0)
Hemoglobin: 13.8 g/dL (ref 13.0–17.0)
MCH: 30.2 pg (ref 26.0–34.0)
MCHC: 35.2 g/dL (ref 30.0–36.0)
MCV: 85.8 fL (ref 80.0–100.0)
Platelets: 257 10*3/uL (ref 150–400)
RBC: 4.57 MIL/uL (ref 4.22–5.81)
RDW: 13 % (ref 11.5–15.5)
WBC: 4.5 10*3/uL (ref 4.0–10.5)
nRBC: 0 % (ref 0.0–0.2)

## 2019-08-01 LAB — ACID FAST SMEAR (AFB, MYCOBACTERIA): Acid Fast Smear: NEGATIVE

## 2019-08-01 LAB — CMV DNA, QUANTITATIVE, PCR
CMV DNA Quant: 262 IU/mL
Log10 CMV Qn DNA Pl: 2.418 log10 IU/mL

## 2019-08-01 MED ORDER — ORAL CARE MOUTH RINSE
15.0000 mL | Freq: Two times a day (BID) | OROMUCOSAL | Status: DC
  Administered 2019-08-01 – 2019-09-07 (×65): 15 mL via OROMUCOSAL

## 2019-08-01 MED ORDER — ADULT MULTIVITAMIN W/MINERALS CH
1.0000 | ORAL_TABLET | Freq: Every day | ORAL | Status: DC
  Administered 2019-08-01 – 2019-08-03 (×3): 1 via ORAL
  Filled 2019-08-01 (×4): qty 1

## 2019-08-01 MED ORDER — BOOST / RESOURCE BREEZE PO LIQD CUSTOM: 1.0000 | Freq: Two times a day (BID) | ORAL | Status: DC

## 2019-08-01 MED ORDER — SODIUM CHLORIDE 0.9 % IV BOLUS
500.0000 mL | Freq: Once | INTRAVENOUS | Status: AC
  Administered 2019-08-01: 500 mL via INTRAVENOUS

## 2019-08-01 MED ORDER — DEXTROSE-NACL 5-0.9 % IV SOLN
INTRAVENOUS | Status: DC
  Administered 2019-08-01 – 2019-08-04 (×6): via INTRAVENOUS

## 2019-08-01 NOTE — Progress Notes (Signed)
Physical Therapy Treatment Patient Details Name: Arthur [name] MRN: [id number] DOB: 24-Jun-1981 Today's Date: 08/01/2019    History of Present Illness Arthur [name] is a 38 y.o. male with medical history significant of untreated HIV, anemia, depression, GERD, substance abuse presenting with c/o dizziness, headache and weakness.  He was admitted with hyponatremia, bradycardia and late syphilis.    PT Comments    Pt was seen with three person help to walk, including his mother to encourage him directionally and his sitter for additional help with movement.  He is getting up with instability and fatigues quickly but is also motivated to work.  Focus on standing endurance as BP dictates.   Follow Up Recommendations  SNF;Home health PT     Equipment Recommendations  Rolling walker with 5" wheels    Recommendations for Other Services       Precautions / Restrictions Precautions Precautions: Fall Precaution Comments: watch BP, HR Restrictions Weight Bearing Restrictions: No    Mobility  Bed Mobility Overal bed mobility: Needs Assistance Bed Mobility: Supine to Sit;Sit to Supine     Supine to sit: Mod assist Sit to supine: Min assist      Transfers Overall transfer level: Needs assistance Equipment used: 2 person hand held assist;Rolling walker (2 wheeled) Transfers: Stand Pivot Transfers;Sit to/from Stand Sit to Stand: Mod assist Stand pivot transfers: Mod assist       General transfer comment: asked to sit during gait  Ambulation/Gait Ambulation/Gait assistance: Mod assist;+2 physical assistance;+2 safety/equipment Gait Distance (Feet): 40 Feet Assistive device: Rolling walker (2 wheeled) Gait Pattern/deviations: Step-to pattern;Step-through pattern;Shuffle;Decreased stride length;Narrow base of support Gait velocity: variable Gait velocity interpretation: <1.8 ft/sec, indicate of risk for recurrent falls General Gait Details: Patient with short shuffling steps  and leaning over to one side with turning with walker leaning into wall and mod A for balance/safety   Stairs             Wheelchair Mobility    Modified Rankin (Stroke Patients Only)       Balance   Sitting-balance support: Feet supported Sitting balance-Leahy Scale: Fair     Standing balance support: No upper extremity supported Standing balance-Leahy Scale: Poor                              Cognition Arousal/Alertness: Lethargic Behavior During Therapy: Flat affect Overall Cognitive Status: Impaired/Different from baseline Area of Impairment: Problem solving;Awareness;Safety/judgement                         Safety/Judgement: Decreased awareness of deficits Awareness: Intellectual Problem Solving: Slow processing;Requires verbal cues        Exercises      General Comments General comments (skin integrity, edema, etc.): pt is listing directions to R and L with gait, sat once during trip      Pertinent Vitals/Pain Pain Assessment: No/denies pain    Home Living                      Prior Function            PT Goals (current goals can now be found in the care plan section) Acute Rehab PT Goals Patient Stated Goal: get up to walk    Frequency    Min 3X/week      PT Plan      Co-evaluation  AM-PAC PT "6 Clicks" Mobility   Outcome Measure  Help needed turning from your back to your side while in a flat bed without using bedrails?: A Little Help needed moving from lying on your back to sitting on the side of a flat bed without using bedrails?: A Lot Help needed moving to and from a bed to a chair (including a wheelchair)?: A Lot Help needed standing up from a chair using your arms (e.g., wheelchair or bedside chair)?: A Lot Help needed to walk in hospital room?: A Lot Help needed climbing 3-5 steps with a railing? : Total 6 Click Score: 12    End of Session         PT Visit Diagnosis:  Other abnormalities of gait and mobility (R26.89);Muscle weakness (generalized) (M62.81)     Time: 2694-8546 PT Time Calculation (min) (ACUTE ONLY): 32 min  Charges:  $Gait Training: 8-22 mins $Therapeutic Activity: 8-22 mins                    Ivar Drape 08/01/2019, 8:53 PM   Samul Dada, PT MS Acute Rehab Dept. Number: [facility] [phone] and [facility] [phone]

## 2019-08-01 NOTE — Progress Notes (Signed)
PT Cancellation Note  Patient Details Name: Arthur [name] MRN: [id number] DOB: 1981-08-19   Cancelled Treatment:    Reason Eval/Treat Not Completed: Medical issues which prohibited therapy.  O2 sats are low, and will check on pt at another time.   Ramond Dial 08/01/2019, 10:29 AM   Mee Hives, PT MS Acute Rehab Dept. Number: [place] [facility] [place]

## 2019-08-01 NOTE — Progress Notes (Signed)
Initial Nutrition Assessment  DOCUMENTATION CODES:   Underweight  INTERVENTION:   -Ensure Enlive po BID, each supplement provides 350 kcal and 20 grams of protein -Boost Breeze po BID, each supplement provides 250 kcal and 9 grams of protein -Multivitamin with minerals daily  NUTRITION DIAGNOSIS:   Increased nutrient needs related to chronic illness as evidenced by estimated needs.  GOAL:   Patient will meet greater than or equal to 90% of their needs  MONITOR:   Supplement acceptance, PO intake, Labs, Weight trends, I & O's  REASON FOR ASSESSMENT:   Malnutrition Screening Tool    ASSESSMENT:   38 y.o. male with medical history significant of untreated HIV, anemia, depression, GERD, substance abuse  Admitted for intracerebral opportunistic infection  **RD working remotely**  Patient currently alert/oriented x 2, not able to verbalize history at this time. Per chart review, pt with nausea today. Has refused breakfast and lunch today. Pt has not drank any Ensures. Will also order Boost Breeze to see if these are accepted more readily.   Per weight records, pt has lost 15 lbs (10% wt loss x 3.5 months, significant for time frame).  I/Os: +5.4L since admit UOP: 600 ml so far today.  Medications: Vitamin B-6 tablet, D5 infusion, IV Zofran PRN Labs reviewed: Low Na  NUTRITION - FOCUSED PHYSICAL EXAM:  Working remotely.  Diet Order:   Diet Order            Diet Heart Room service appropriate? Yes; Fluid consistency: Thin  Diet effective now              EDUCATION NEEDS:   Not appropriate for education at this time  Skin:  Skin Assessment: Reviewed RN Assessment  Last BM:  11/1  Height:   Ht Readings from Last 1 Encounters:  07/29/19 5\' 11"  (1.803 m)    Weight:   Wt Readings from Last 1 Encounters:  07/30/19 57.6 kg    Ideal Body Weight:  78.1 kg  BMI:  Body mass index is 17.71 kg/m.  Estimated Nutritional Needs:   Kcal:   2000-2200  Protein:  100-115g  Fluid:  2L/day  Clayton Bibles, MS, RD, LDN Inpatient Clinical Dietitian Pager: [phone] After Hours Pager: [phone]

## 2019-08-01 NOTE — Progress Notes (Signed)
BP 88/57, retaken and found to be 84/54 with HR 51 SB. Patient laying in bed, denies dizziness/cp/SOB.  Dr. Roger Shelter notified per notification parameters.

## 2019-08-01 NOTE — Progress Notes (Signed)
PROGRESS NOTE    Arthur [name]  XBJ:[id number] DOB: 1981-01-06 DOA: 07/28/2019 PCP: Truman Hayward, MD   Brief Narrative:  Arthur [name] is a 38 y.o. male with medical history significant of untreated HIV, anemia, depression, GERD, substance abuse presented to ED on 07/28/2019 with a complaint of dizziness, weakness, intermittent headache.  He has not taken any of his HIV medications for about 1 year due to affordability issue.  MRI brain done in ED showed Widespread areas of abnormal low level restricted diffusion and postcontrast enhancement throughout both cerebral hemispheres, predominantly infratentorial, with the dominant abnormality in the LEFT anterior frontal white matter. In this immunocompromised patient, the findings are most consistent with an opportunistic infection, such as toxoplasmosis, tuberculosis or fungal cerebritis/meningitis, or parasitic infection.  Patient was admitted to hospital service and ID was consulted.  He then revealed to [place] that he has lost his vision in his right eye.   ID started him on penicillin and ganciclovir for possible CMV retinitis versus synovitis retinitis and ophthalmology also saw him.  07/30/2019-status post lumbar puncture by neurology he ---------------------------------------------------------------------------------------------------------------------------------------------  Subjective: The patient was seen and examined this morning did not want to participate in in conversation or exam. After several verbal cues he finally did.  Nursing staff reporting poor p.o. intake, Had agitation confusion overnight.  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   Assessment & Plan:   Principal Problem:   Vision loss Active Problems:   Late syphilis   Acquired immunodeficiency syndrome (HCC)   Bradycardia   Hyponatremia   HIV (human immunodeficiency virus infection) ([place])   Abnormal brain MRI  Meningitis/intracranial  infection/bleeding till right eye blindness in a patient with untreated HIV/AIDS infection/dizziness: Encephalopathic likely due to intracranial infection ID concerning differential no TB meningitis  -Afebrile, normotensive this a.m. remains agitated restless  -Status post LP 07/30/2019: low glucose < 50% serum and very elevated protein > 600. 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   Hepatitis panel negative.   Significantly elevated toxoplasma IgG.     -On IV steroids -Continue current antibiotics, day 4: IV penicillin/ganciclovir/  Day 2 : Rifampin, isoniazid, pyrazinamide, ethambutol, pyridoxine  Encephalopathy with confusion/agitation -Likely due to intracranial infection, -ID added to steroid today, dexamethasone 12 mg/kg daily for 3 weeks with taper for next 3-5 weeks -Appreciate neurology input -We will add as needed Ativan/Haldol/    Mononuclear ophthalmopathy: -Appreciate ophthalmology following closely and recommendation  Either secondary to CMV retinitis versus site for his retinitis.  On ganciclovir and high-dose penicillins per ID.  Seen by ophthalmology.  Mild hyponatremia: Serum sodium 1-1 30, continue normal saline  Sinus bradycardia : -Monitoring - He was seen in the ED on 07/22/2019 and EKG was done and he was not bradycardic.  -We will continue to monitor closely    DVT prophylaxis: Lovenox (was held LP) Code Status: Full code Family Communication:  None present at bedside.  Plan of care discussed with patient in length and he verbalized understanding and agreed with it. Disposition Plan: TBD  Estimated body mass index is 17.71 kg/m as calculated from the following:   Height as of this encounter: 5\' 11"  (1.803 m).    Weight  as of this encounter: 57.6 kg.      Nutritional status: Poor p.o. 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) (+) serum Toxo IgG  opthal more c/w CMV retinitis Cryptococcal serum Ag (-) Cryptococcal CSF Ag (-)  AFB smear/culture and other diagnostic studies    Objective: Vitals:   08/01/19 0842 08/01/19 0900 08/01/19 1156 08/01/19 1237  BP: 99/68  97/75 (!) 91/45  Pulse: (!) 55  (!) 53   Resp:   17   Temp:  97.6 F (36.4 C) (!) 97.5 F (36.4 C)   TempSrc:   Oral   SpO2: (!) 66%  100%   Weight:      Height:        Intake/Output Summary (Last 24 hours) at 08/01/2019 1421 Last data filed at 08/01/2019 1316 Gross per 24 hour  Intake 3244.71 ml  Output 2000 ml  Net 1244.71 ml   Filed Weights   07/29/19 0753 07/30/19 0546  Weight: 57 kg 57.6 kg    Examination:  .BP (!) 91/45 (BP Location: Right Arm)   Pulse (!) 53   Temp (!) 97.5 F (36.4 C) (Oral)   Resp 17   Ht 5\' 11"  (1.803 m)   Wt 57.6 kg   SpO2 100%   BMI 17.71 kg/m    Physical Exam  Constitution:  Alert, cooperative, mild to moderate distress, agitated Psychiatric: Minimum interaction, minimal words, refusing oral intake With encouragement was able to take his oral medication HEENT: Total loss of vision on right eye, no changes in vision,  Otherwise head neck and throat within normal limits Chest:Chest symmetric Cardio vascular:  S1/S2, RRR, No murmure, No Rubs or Gallops  pulmonary: Clear to auscultation bilaterally, respirations unlabored, negative wheezes / crackles Abdomen: Soft, non-tender, non-distended, bowel sounds,no masses, no organomegaly Muscular skeletal: Limited exam - in bed, able to move all 4 extremities, Normal strength,  Neuro: CNII-XII intact. , normal motor and sensation, reflexes intact   Extremities: No pitting edema lower extremities, +2 pulses  Skin: Dry, warm to touch, negative for any Rashes, No open wounds Wounds: per nursing documentation       Data Reviewed: I have personally reviewed following labs and imaging studies  CBC: Recent Labs  Lab 07/28/19 1059 07/28/19 2201 07/29/19 0332 07/30/19 1113 08/01/19 0310  WBC 2.5* 1.9* 2.7* 2.0* 4.5  NEUTROABS  --  0.8*  --  1.3*  --   HGB 15.3 14.2 14.4 13.5 13.8  HCT 46.3 42.8 42.6 39.7 39.2  MCV 90.8 90.7 89.5 87.8 85.8  PLT 312 272 240 255 257   Basic Metabolic Panel: Recent Labs  Lab 07/26/19 0915 07/28/19 1059 07/29/19 0332 07/30/19 1113 08/01/19 0310  NA 133* 132* 134* 130* 129*  K 4.2 4.3 4.4 4.2 4.7  CL 97* 99 104 99 103  CO2 26 25 25 23  19*  GLUCOSE 95 89 96 103* 109*  BUN 18 14 16 9 13   CREATININE 1.13 1.01 0.87 0.85 0.88  CALCIUM 9.3 9.5 8.9 9.0 9.0  MG  --   --  2.0 1.7  --   PHOS  --   --  3.1  --   --    GFR: Estimated Creatinine Clearance: 94.5 mL/min (by C-G formula based on SCr of 0.88 mg/dL). Liver Function Tests: Recent Labs  Lab 07/26/19 0915 07/29/19 0332 08/01/19 0310  AST 30 30 24   ALT 17 16 14   ALKPHOS 61 49 45  BILITOT 0.8 0.4 0.6  PROT 10.0* 8.3* 7.9  ALBUMIN 3.5 2.9* 2.6*   No results for input(s): LIPASE, AMYLASE in the last 168 hours. No results for input(s): AMMONIA in the last 168 hours. Coagulation Profile: Recent Labs  Lab 07/30/19 0852  INR 1.1    Recent Results (from the past 240 hour(s))  SARS CORONAVIRUS 2 (TAT 6-24 HRS) Nasopharyngeal Nasopharyngeal Swab     Status: None   Collection Time: 07/28/19  8:24 PM   Specimen: Nasopharyngeal Swab  Result Value Ref Range Status   SARS Coronavirus 2 NEGATIVE NEGATIVE Final    Comment: (NOTE) SARS-CoV-2 target nucleic acids are NOT DETECTED. The SARS-CoV-2 RNA is generally detectable in upper and lower respiratory specimens during the acute phase of infection. Negative results do not preclude  SARS-CoV-2 infection, do not rule out co-infections with other pathogens, and should not be used as the sole basis for treatment or other patient management decisions. Negative results must be combined with clinical observations, patient history, and epidemiological information. The expected result is Negative. Fact Sheet for Patients: 13/03/20 Fact Sheet for Healthcare Providers: 13/01/20 This test is not yet approved or cleared by the [place] FDA and  has been authorized for detection and/or diagnosis of SARS-CoV-2 by FDA under an Emergency Use Authorization (EUA). This EUA will remain  in effect (meaning this test can be used) for the duration of the COVID-19 declaration under Section 56 4(b)(1) of the Act, 21 U.S.C. section 360bbb-3(b)(1), unless the authorization is terminated or revoked sooner. Performed at [facility], [address], [place], [address] [place]   CSF culture with Stat gram stain     Status: None (Preliminary result)   Collection Time: 07/30/19  1:46 PM   Specimen: CSF; Cerebrospinal Fluid  Result Value Ref Range Status   Specimen Description CSF  Final   Special Requests NONE  Final   Gram Stain   Final    WBC PRESENT, PREDOMINANTLY MONONUCLEAR NO ORGANISMS SEEN CYTOSPIN SMEAR    Culture   Final    NO GROWTH 2 DAYS Performed at [facility], [address], [place], [address] [place]    Report Status PENDING  Incomplete  Acid Fast Smear (AFB)     Status: None   Collection Time: 07/30/19  1:46 PM  Result Value Ref Range Status   AFB Specimen Processing Concentration  Final   Acid Fast Smear Negative  Final    Comment: (NOTE) Performed At: [facility] [address] [place], [address] [place] [id number]    Source (AFB) CSF  Final    Comment: Performed at [facility], [address] [place], [address] [place]       Radiology Studies: No results found.  Scheduled Meds: . ethambutol  1,200 mg Oral Daily  . feeding supplement (ENSURE ENLIVE)  237 mL Oral BID BM  . influenza vac split quadrivalent PF  0.5 mL Intramuscular Tomorrow-1000  . isoniazid  300 mg Oral Daily  . mouth rinse  15 mL Mouth Rinse BID  . pyrazinamide  1,500 mg Oral Daily  . vitamin B-6  50 mg Oral Daily  . rifampin  600 mg Oral Daily  . sodium chloride flush  3 mL Intravenous Once  . sodium chloride flush  3 mL Intravenous Q12H  . sulfamethoxazole-trimethoprim  1 tablet Oral Daily   Continuous Infusions: . sodium chloride 10 mL/hr at 07/29/19 0742  . dexamethasone (DECADRON) IVPB ([facility]) 25 mg (08/01/19 1106)  . dextrose 5 % and 0.9% NaCl 100 mL/hr at 08/01/19 1256  . ganciclovir 285 mg (08/01/19 1144)  . penicillin g continuous IV infusion 41.7 mL/hr at 08/01/19 1005     LOS: 3 days   Time spent: 29 minutes   Kendell Bane, MD [facility]  08/01/2019, 2:21 PM   To contact the attending provider between 7A-7P or the covering provider during after hours 7P-7A, please log into the web site [email or web address] and use password [email or web address].

## 2019-08-01 NOTE — Progress Notes (Signed)
Patient continues to refuse food or liquids po.  Has had minimal intake past 2 days.  C/o nasea at 1215 hrs, given IV zofran 4mg  with relief.  Remains confused to time and situation.  Sleeping with periods of restlessness and pulling at IV lines and external urinary catheter.

## 2019-08-01 NOTE — Progress Notes (Signed)
[facility]  Date of Admission:  07/28/2019      Total days of antibiotics 4  Day 4 penicillin g   Day 4 ganciclovir   Day 2 RIPE + Pyridoxine            ASSESSMENT: Arthur ParrMichael J [name] is a 38 y.o. male with advanced HIV, +AIDS with recent CD4 35 with vision loss of the right eye with associated dizziness and abnormal brain MRI with few scattered small lesions and one larger left frontal lobe lesion.  LP Glu 28, protein > 600. WBC 60s. Gram stain negative as is day 2 of cultures. VDRL 1:64Opening pressure is normal. No meningismus on exam. No fever. 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  Continue RIPE started while awaiting AFB smear/culture.  He may need repeat tap to re-sample to increase yield, especially if repeat MRI unchanged after 1 week of therapy.  Holding on presumptive toxo treatment for now.  Confusion, Encephalopathy -  Seems a little better today. Continue steroids.   HIV / AIDS -  Hold ART minimum 2 weeks for now given CNS infection with risk for IRIS.   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 Uncertain if this is recoverable as we are not 100% certain of the cause at this time.    PLAN: 1. Continue ganciclovir IV  2. Continue penicillin IV 3. Continue Rifampin, isoniazid, pyrazinamide, ethambutol, pyridoxine 4. Hold ART for now   Principal Problem:   Vision loss Active Problems:   Late syphilis   Acquired immunodeficiency syndrome (HCC)   Abnormal brain MRI   Bradycardia   Hyponatremia   HIV (human immunodeficiency  virus infection) (HCC)   . ethambutol  1,200 mg Oral Daily  . feeding supplement (ENSURE ENLIVE)  237 mL Oral BID BM  . influenza vac split quadrivalent PF  0.5 mL Intramuscular Tomorrow-1000  . isoniazid  300 mg Oral Daily  . mouth rinse  15 mL Mouth Rinse BID  . pyrazinamide  1,500 mg Oral Daily  . vitamin B-6  50 mg Oral Daily  . rifampin  600 mg Oral Daily  . sodium chloride flush  3 mL Intravenous Once  . sodium chloride flush  3 mL Intravenous Q12H  . sulfamethoxazole-trimethoprim  1 tablet Oral Daily    SUBJECTIVE: Still with same vision loss. Not very verbal and sleeping mostly. Nodding appropriately.   Interval Additions -  Sitter present. WBC 4.5.     Review of Systems: Review of Systems  Constitutional: Negative for chills and fever.  Eyes:       R eye vision loss  Respiratory: Negative for cough and wheezing.   Gastrointestinal: Negative for abdominal pain, diarrhea and nausea.  Musculoskeletal: Negative for neck pain.  Skin: Negative for rash.  Neurological: Negative for dizziness, seizures, weakness and headaches.    No Known Allergies  OBJECTIVE: Vitals:   08/01/19 0807 08/01/19 0842 08/01/19 0900 08/01/19 1156  BP: 95/65 99/68    Pulse: (!) 49 (!) 55    Resp: 18  17  Temp:   97.6 F (36.4 C)   TempSrc:      SpO2: 100% (!) 66%    Weight:      Height:       Body mass index is 17.71 kg/m.  Physical Exam Constitutional:      Comments: Prone in bed getting assisted bath.   HENT:     Mouth/Throat:     Mouth: Mucous membranes are moist.     Pharynx: Oropharynx is clear.  Eyes:     General: No scleral icterus.    Pupils: Pupils are equal, round, and reactive to light.  Cardiovascular:     Rate and Rhythm: Normal rate and regular rhythm.  Pulmonary:     Effort: Pulmonary effort is normal.  Musculoskeletal: Normal range of motion.  Skin:    General: Skin is warm and dry.     Capillary Refill: Capillary refill takes less than 2 seconds.      Findings: No rash.  Neurological:     Mental Status: He is alert and oriented to person, place, and time.     Lab Results Lab Results  Component Value Date   WBC 4.5 08/01/2019   HGB 13.8 08/01/2019   HCT 39.2 08/01/2019   MCV 85.8 08/01/2019   PLT 257 08/01/2019    Lab Results  Component Value Date   CREATININE 0.88 08/01/2019   BUN 13 08/01/2019   NA 129 (L) 08/01/2019   K 4.7 08/01/2019   CL 103 08/01/2019   CO2 19 (L) 08/01/2019    Lab Results  Component Value Date   ALT 14 08/01/2019   AST 24 08/01/2019   ALKPHOS 45 08/01/2019   BILITOT 0.6 08/01/2019     Microbiology: Recent Results (from the past 240 hour(s))  SARS CORONAVIRUS 2 (TAT 6-24 HRS) Nasopharyngeal Nasopharyngeal Swab     Status: None   Collection Time: 07/28/19  8:24 PM   Specimen: Nasopharyngeal Swab  Result Value Ref Range Status   SARS Coronavirus 2 NEGATIVE NEGATIVE Final    Comment: (NOTE) SARS-CoV-2 target nucleic acids are NOT DETECTED. The SARS-CoV-2 RNA is generally detectable in upper and lower respiratory specimens during the acute phase of infection. Negative results do not preclude SARS-CoV-2 infection, do not rule out co-infections with other pathogens, and should not be used as the sole basis for treatment or other patient management decisions. Negative results must be combined with clinical observations, patient history, and epidemiological information. The expected result is Negative. Fact Sheet for Patients: HairSlick.no Fact Sheet for Healthcare Providers: quierodirigir.com This test is not yet approved or cleared by the [place] FDA and  has been authorized for detection and/or diagnosis of SARS-CoV-2 by FDA under an Emergency Use Authorization (EUA). This EUA will remain  in effect (meaning this test can be used) for the duration of the COVID-19 declaration under Section 56 4(b)(1) of the Act, 21 U.S.C. section  360bbb-3(b)(1), unless the authorization is terminated or revoked sooner. Performed at [facility], [address], [place], [place] [address]   CSF culture with Stat gram stain     Status: None (Preliminary result)   Collection Time: 07/30/19  1:46 PM   Specimen: CSF; Cerebrospinal Fluid  Result Value Ref Range Status   Specimen Description CSF  Final   Special Requests NONE  Final   Gram Stain   Final    WBC PRESENT, PREDOMINANTLY MONONUCLEAR NO ORGANISMS SEEN CYTOSPIN SMEAR    Culture   Final  NO GROWTH 2 DAYS Performed at [facility], [place] [address], [place], [place] [address]    Report Status PENDING  Incomplete  Acid Fast Smear (AFB)     Status: None   Collection Time: 07/30/19  1:46 PM  Result Value Ref Range Status   AFB Specimen Processing Concentration  Final   Acid Fast Smear Negative  Final    Comment: (NOTE) Performed At: [facility] [place], [place] [id number] Rush Farmer MD [id number]    Source (AFB) CSF  Final    Comment: Performed at [facility], [place] [address] [place], [place] [address]     Janene Madeira, MSN, NP-C [facility] [email or web address] Pager: [phone] Office: [phone] [place]: [phone]

## 2019-08-02 DIAGNOSIS — R9089 Other abnormal findings on diagnostic imaging of central nervous system: Secondary | ICD-10-CM

## 2019-08-02 DIAGNOSIS — H547 Unspecified visual loss: Secondary | ICD-10-CM

## 2019-08-02 DIAGNOSIS — Z515 Encounter for palliative care: Secondary | ICD-10-CM

## 2019-08-02 DIAGNOSIS — B20 Human immunodeficiency virus [HIV] disease: Principal | ICD-10-CM

## 2019-08-02 DIAGNOSIS — H3090 Unspecified chorioretinal inflammation, unspecified eye: Secondary | ICD-10-CM

## 2019-08-02 LAB — COMPREHENSIVE METABOLIC PANEL
ALT: 14 U/L (ref 0–44)
AST: 23 U/L (ref 15–41)
Albumin: 2.6 g/dL — ABNORMAL LOW (ref 3.5–5.0)
Alkaline Phosphatase: 44 U/L (ref 38–126)
Anion gap: 5 (ref 5–15)
BUN: 11 mg/dL (ref 6–20)
CO2: 20 mmol/L — ABNORMAL LOW (ref 22–32)
Calcium: 9.1 mg/dL (ref 8.9–10.3)
Chloride: 109 mmol/L (ref 98–111)
Creatinine, Ser: 0.78 mg/dL (ref 0.61–1.24)
GFR calc Af Amer: >60 mL/min (ref >60)
GFR calc non Af Amer: >60 mL/min (ref >60)
Glucose, Bld: 111 mg/dL — ABNORMAL HIGH (ref 70–99)
Potassium: 4.3 mmol/L (ref 3.5–5.1)
Sodium: 134 mmol/L — ABNORMAL LOW (ref 135–145)
Total Bilirubin: 0.2 mg/dL — ABNORMAL LOW (ref 0.3–1.2)
Total Protein: 8.3 g/dL — ABNORMAL HIGH (ref 6.5–8.1)

## 2019-08-02 LAB — CBC
HCT: 36.7 % — ABNORMAL LOW (ref 39.0–52.0)
Hemoglobin: 13.1 g/dL (ref 13.0–17.0)
MCH: 30.5 pg (ref 26.0–34.0)
MCHC: 35.7 g/dL (ref 30.0–36.0)
MCV: 85.3 fL (ref 80.0–100.0)
Platelets: 270 10*3/uL (ref 150–400)
RBC: 4.3 MIL/uL (ref 4.22–5.81)
RDW: 13.2 % (ref 11.5–15.5)
WBC: 3.6 10*3/uL — ABNORMAL LOW (ref 4.0–10.5)
nRBC: 0 % (ref 0.0–0.2)

## 2019-08-02 LAB — CSF CULTURE W GRAM STAIN: Culture: NO GROWTH

## 2019-08-02 MED ORDER — CYCLOPENTOLATE HCL 2 % OP SOLN
1.0000 [drp] | OPHTHALMIC | Status: AC
  Administered 2019-08-02 (×3): 1 [drp] via OPHTHALMIC
  Filled 2019-08-02: qty 2

## 2019-08-02 MED ORDER — PHENYLEPHRINE HCL 2.5 % OP SOLN
1.0000 [drp] | OPHTHALMIC | Status: AC
  Administered 2019-08-02 (×2): 1 [drp] via OPHTHALMIC
  Filled 2019-08-02: qty 2

## 2019-08-02 NOTE — Consult Note (Signed)
                                                                                 Consultation Note Date: 08/02/2019   Patient Name: Arthur [name]  DOB: 07/19/1981  MRN: [id number]  Age / Sex: 38 y.o., male  PCP: Van Dam, Cornelius N, MD Referring Physician: Shahmehdi, Seyed A, MD  Reason for Consultation: Establishing goals of care and Psychosocial/spiritual support  HPI/Patient Profile: 38 y.o. male with past medical history of HIV, polysubstance abuse, anxiety, depression, and anemia who was admitted on 07/28/2019 with dizziness and weakness.    On admission he reported that he had been unable to afford his HIV medications for the past year.  He was admitted for evaluation and treatment of opportunistic infection.  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    Clinical Assessment and Goals of Care:  I have reviewed medical records including EPIC notes, labs and imaging, received report from the care team, assessed the patient and then met at the bedside along with his sister, [name].  Afterward I spoke to his mother, [name] on the phone,  to discuss diagnosis prognosis, GOC, EOL wishes, disposition and options.  I introduced Palliative Medicine as specialized medical care for people living with serious illness. It focuses on providing relief from the symptoms and stress of a serious illness. The goal is to improve quality of life for both the patient and the family.  We discussed a brief life review of the patient.   Both [name] and [name] describe [name] as a "Live and let live" kind of person.  He lives with his sister.  They have always been very close.  [name] was a twin.  His twin sister died of SIDS at 6 months old.  He never married and has no children.  He treats his nephews and nieces as his children.  [name] worked full time for the past year at [employer].  Just before coming  into the hospital he called in sick and was fired.  [name] loves [name] and eating sweets.  As far as functional and nutritional status [name] was eating, drinking, walking and acting normally two weeks prior to admission.  Now his mother tells me his memory is 100% intact but his words are slurred and he is having difficulty getting up.     We discussed his current illness and what it means in the larger context of his on-going co-morbidities.  Natural disease trajectory and expectations at EOL were discussed.  The patient's mother was very direct - Tell me worse case scenario.  I pray for the best but prefer to prepare for the worst.  [name] asked me if [name]'s symptoms could get worse.  I explained that they could but that we are doing our best to prevent that from happening.  I explained that even if [name]'s infection is cured we are uncertain of what his new baseline will be.  We talked about needing rehab at a skilled nursing facility after this admission before going home.  Questions and concerns were addressed.   The family was encouraged to call with questions or concerns.  [name] will be in the hospital after 2:00 pm tomorrow.  I'll plan to meet her in person then.   Primary Decision Maker:  NEXT OF KIN Mother, [name].    SUMMARY OF RECOMMENDATIONS    Continue current care Palliative will follow with you and continue to support the family.  Code Status/Advance Care Planning:  Full   Additional Recommendations (Limitations, Scope, Preferences):  Full Scope Treatment  Palliative Prophylaxis:   Delirium Protocol   Prognosis:   Unable to determine.  Discharge Planning: To Be Determined      Primary Diagnoses: Present on Admission: . Acquired immunodeficiency syndrome (HCC) . Bradycardia . Hyponatremia . Late syphilis . HIV (human immunodeficiency virus infection) (HCC)   I have reviewed the medical record, interviewed the patient and family, and  examined the patient. The following aspects are pertinent.  Past Medical History:  Diagnosis Date  . Anemia   . Anxiety   . Depression   . GERD (gastroesophageal reflux disease)   . HIV infection (HCC)   . Substance abuse (HCC)    Social History   Socioeconomic History  . Marital status: Single    Spouse name: Not on file  . Number of children: Not on file  . Years of education: Not on file  . Highest education level: Not on file  Occupational History  . Not on file  Social Needs  . Financial resource strain: Not on file  . Food insecurity    Worry: Not on file    Inability: Not on file  . Transportation needs    Medical: Not on file    Non-medical: Not on file  Tobacco Use  . Smoking status: Current Every Day Smoker    Packs/day: 0.40    Years: 17.00    Pack years: 6.80  . Smokeless tobacco: Never Used  Substance and Sexual Activity  . Alcohol use: No    Comment: occassionally  . Drug use: Yes    Types: Marijuana  . Sexual activity: Not Currently    Partners: Male    Comment: pt. declined condoms  Lifestyle  . Physical activity    Days per week: Not on file    Minutes per session: Not on file  . Stress: Not on file  Relationships  . Social connections    Talks on phone: Not on file    Gets together: Not on file    Attends religious service: Not on file    Active member of club or organization: Not on file    Attends meetings of clubs or organizations: Not on file    Relationship status: Not on file  Other Topics Concern  . Not on file  Social History Narrative  . Not on file   Family History  Problem Relation Age of Onset  . Diabetes Neg Hx   . CAD Neg Hx   . Hypertension Neg Hx    Scheduled Meds: . cyclopentolate  1 drop Both Eyes Q5 min  . ethambutol  1,200 mg Oral Daily  . feeding supplement  1 Container Oral BID WC  . feeding supplement (ENSURE ENLIVE)  237 mL Oral BID BM  . influenza vac split quadrivalent PF  0.5 mL Intramuscular  Tomorrow-1000  . isoniazid  300 mg Oral Daily  . mouth rinse  15 mL Mouth Rinse BID  . multivitamin with minerals  1 tablet Oral Daily  . phenylephrine  1 drop   Both Eyes Q1 Hr x 2  . pyrazinamide  1,500 mg Oral Daily  . vitamin B-6  50 mg Oral Daily  . rifampin  600 mg Oral Daily  . sodium chloride flush  3 mL Intravenous Once  . sodium chloride flush  3 mL Intravenous Q12H  . sulfamethoxazole-trimethoprim  1 tablet Oral Daily   Continuous Infusions: . sodium chloride 10 mL/hr at 07/29/19 0742  . dexamethasone (DECADRON) IVPB ([facility]) 25 mg (08/02/19 1300)  . dextrose 5 % and 0.9% NaCl 125 mL/hr at 08/02/19 0536  . ganciclovir 285 mg (08/02/19 0908)  . penicillin g continuous IV infusion 12 Million Units (08/02/19 0453)   PRN Meds:.sodium chloride, acetaminophen **OR** acetaminophen, haloperidol lactate, ondansetron **OR** ondansetron (ZOFRAN) IV, sodium chloride flush No Known Allergies Review of Systems patient sleeping and did not engage with me.  Physical Exam  Thin well developed male, lying comfortably in bed.  Did not wake to voice, NAD.  Sister at bedside.  Sitter in room.  Vital Signs: BP (!) 93/54 (BP Location: Left Arm)   Pulse (!) 50   Temp 97.7 F (36.5 C) (Oral)   Resp 19   Ht 5' 11" (1.803 m)   Wt 55.1 kg   SpO2 95%   BMI 16.94 kg/m  Pain Scale: 0-10   Pain Score: 0-No pain   SpO2: SpO2: 95 % O2 Device:SpO2: 95 % O2 Flow Rate: .   IO: Intake/output summary:   Intake/Output Summary (Last 24 hours) at 08/02/2019 1509 Last data filed at 08/02/2019 1434 Gross per 24 hour  Intake 1356.41 ml  Output 3050 ml  Net -1693.59 ml    LBM: Last BM Date: 07/28/19 Baseline Weight: Weight: 57 kg Most recent weight: Weight: 55.1 kg     Palliative Assessment/Data: 40%     Time In: 2:00 Time Out: 3:15 Time Total: 75 min. Visit consisted of counseling and education dealing with the complex and emotionally intense issues surrounding the need for palliative care  and symptom management in the setting of serious and potentially life-threatening illness. Greater than 50%  of this time was spent counseling and coordinating care related to the above assessment and plan.  Signed by: Florentina Jenny, PA-C Palliative Medicine Pager: [phone]  Please contact Palliative Medicine Team phone at [phone] for questions and concerns.  For individual provider: See Shea Evans

## 2019-08-02 NOTE — Progress Notes (Signed)
Subjective: patient lying in bed. Notes he does not see out of OD. No eye pain. Denies visual change OS, though history limited by mental status. Sitter at bedside.   Exam: VAsc (near) OD No light perception OS 20/400 (post dilation and patient disoriented, limited participation)  Pupils: Round, dilated OU  T(pen) OD 17 mm Hg OS 14 mm Hg   Anterior Segment: Ext/Lids: wnl, no edema or erythema OU Conj/sclera: white/quiet OU, non-icteric OU K: clear OU AC: deep, grossly quiet OU Iris: round, flat OU Lens: Clear OU  Poor dilation OU  DFE Vitreous: clear OU without snowballs or snowbanking Optic nerve:  OD sharp without significant edema, ?temporal pallor, c/d 0.3 OS pink and sharp without edema or pallor c/d 0.3 Macula: clear and flat OU Vessels: normal distribution OU Periphery: OD nasal 5-6disc diameter area of retinitis around nasal blood vessel. Single dot blot hemorrhage anterior to area of retinitis. Otherwise flat and attached without other infiltrates. Appears mostly stable in size with slight increased posterior extension. OS: flat and attached 360 without any visible infiltrates or retinitis   IMP/PLAN:  1.Optic neuropathyOD - severe visual loss (NLP) with brisk afferent pupillary defect is typically consistent with optic nerve pathology - suspect optic nerve lesion(Ddx includes infiltrative, infectious, ischemic)in more posterioroptic nerve (anterior lesions typically have disc edema and no significant disc edema noted on exam) - The patient's was NLP OD on presentation and I think has a poor prognosis in terms of any resolution or improvement in vision OD.  -If planning re-imaging/MRI as part of Neuro-work up please include MRI of the orbits with and without contrast    2. Retinitis OD - pt with HIV and CD4 of 35, +RPR and +FTA-ABS, +CSF VDRL - area of retinitis not consistent with afferent pupillary defect or degree of visual loss - ddx  :syphilitic retinitis, CMV retinitis, toxoplasmosis (typically presents with prior chorioretinal scar, which patient doesn't have, but could be an atypical presention due to immunocompromise), TB retinitis, fungal (usually presents with vitritis, which I don't see on exam) - no retinitis noted in macula - recommend continuing treatment with IV ganciclovir for presumed CMV retinitisand penicillin at neurosyphillis dosing for possible syphyllitic retinitis: can adjust according to  LP results   3. Ethambutol Therapy - monitor for optic neuropathy OS   Lonia Skinner, MD [facility] [phone]

## 2019-08-02 NOTE — Progress Notes (Signed)
[facility]   Reason for visit: Follow up on CNS infection  Interval History: no acute events.  WBC 3.6.  Creat wnl.  No fever.  He requests water.  No complaints.   Physical Exam: Constitutional:  Vitals:   08/01/19 2337 08/02/19 0454  BP: 102/77 (!) 93/54  Pulse: (!) 50   Resp: 17 19  Temp: (!) 97.5 F (36.4 C) 97.7 F (36.5 C)  SpO2: 96% 95%   patient appears in NAD Eyes: anicteric Respiratory: Normal respiratory effort; CTA B Cardiovascular: RRR GI: soft, nt, nd Neuro: he is oriented to place, slowed, slurred speech some but more alert, more interactive relative to previous.    Review of Systems: Constitutional: negative for fevers and chills Gastrointestinal: negative for diarrhea Neurological: negative for headaches  Lab Results  Component Value Date   WBC 3.6 (L) 08/02/2019   HGB 13.1 08/02/2019   HCT 36.7 (L) 08/02/2019   MCV 85.3 08/02/2019   PLT 270 08/02/2019    Lab Results  Component Value Date   CREATININE 0.78 08/02/2019   BUN 11 08/02/2019   NA 134 (L) 08/02/2019   K 4.3 08/02/2019   CL 109 08/02/2019   CO2 20 (L) 08/02/2019    Lab Results  Component Value Date   ALT 14 08/02/2019   AST 23 08/02/2019   ALKPHOS 44 08/02/2019     Microbiology: Recent Results (from the past 240 hour(s))  SARS CORONAVIRUS 2 (TAT 6-24 HRS) Nasopharyngeal Nasopharyngeal Swab     Status: None   Collection Time: 07/28/19  8:24 PM   Specimen: Nasopharyngeal Swab  Result Value Ref Range Status   SARS Coronavirus 2 NEGATIVE NEGATIVE Final    Comment: (NOTE) SARS-CoV-2 target nucleic acids are NOT DETECTED. The SARS-CoV-2 RNA is generally detectable in upper and lower respiratory specimens during the acute phase of infection. Negative results do not preclude SARS-CoV-2 infection, do not rule out co-infections with other pathogens, and should not be used as the sole basis for treatment or other patient management decisions. Negative  results must be combined with clinical observations, patient history, and epidemiological information. The expected result is Negative. Fact Sheet for Patients: HairSlick.no Fact Sheet for Healthcare Providers: quierodirigir.com This test is not yet approved or cleared by the [place] FDA and  has been authorized for detection and/or diagnosis of SARS-CoV-2 by FDA under an Emergency Use Authorization (EUA). This EUA will remain  in effect (meaning this test can be used) for the duration of the COVID-19 declaration under Section 56 4(b)(1) of the Act, 21 U.S.C. section 360bbb-3(b)(1), unless the authorization is terminated or revoked sooner. Performed at [facility], [address], [place], [place] [address]   CSF culture with Stat gram stain     Status: None (Preliminary result)   Collection Time: 07/30/19  1:46 PM   Specimen: CSF; Cerebrospinal Fluid  Result Value Ref Range Status   Specimen Description CSF  Final   Special Requests NONE  Final   Gram Stain   Final    WBC PRESENT, PREDOMINANTLY MONONUCLEAR NO ORGANISMS SEEN CYTOSPIN SMEAR    Culture   Final    NO GROWTH 2 DAYS Performed at [facility], [address] [place], [place] [address]    Report Status PENDING  Incomplete  Acid Fast Smear (AFB)     Status: None   Collection Time: 07/30/19  1:46 PM  Result Value Ref Range Status   AFB  Specimen Processing Concentration  Final   Acid Fast Smear Negative  Final    Comment: (NOTE) Performed At: [facility] [place], [place] [id number] Rush Farmer MD SE:[id number]    Source (AFB) CSF  Final    Comment: Performed at [facility], [place] [address], [place], [place] [address]    Impression/Plan:  1. CNS infection - still a broad differential in this immunocomprimised host.  His MRI lesions are not specific but clearly fungal, Mycobacterial high on the differential.   On empiric Tb treatment including steroids.  He clinically seems a bit better.  May be a steroid effect vs some improvement.  Still waiting for toxo PCR in CSF HSV negative Will consider repeat MRI Monday or Tuesday Will request repeat LP as well next week unless clear diagnosis found to repeat AFB and other tests.  2.  Neurosyphilis - on IV penicillin and will need 14 days total.  Will give him IM bicillin after completion as well.   3.  Retinitis - concern for CMV infection and on empiric ganciclovir.  I will continue with this though no clear evidence.   4.  HIV/AIDS - will need to continue to hold ARVs due to above concerns, particularly if he has Tb meningitis.    5. OI risk - will continue with bactrim DS daily  Dr. Tommy Medal on over the weekend and will monitor results I will follow up again on Monday

## 2019-08-02 NOTE — Progress Notes (Signed)
PROGRESS NOTE    Arthur [name]  HGD:[id number] DOB: [birth year] DOA: 07/28/2019 PCP: Randall Hiss, MD   Brief Narrative:  Arthur [name] is a 38 y.o. male with medical history significant of untreated HIV, anemia, depression, GERD, substance abuse presented to ED on 07/28/2019 with a complaint of dizziness, weakness, intermittent headache.  He has not taken any of his HIV medications for about 1 year due to affordability issue.  MRI brain done in ED showed Widespread areas of abnormal low level restricted diffusion and postcontrast enhancement throughout both cerebral hemispheres, predominantly infratentorial, with the dominant abnormality in the LEFT anterior frontal white matter. In this immunocompromised patient, the findings are most consistent with an opportunistic infection, such as toxoplasmosis, tuberculosis or fungal cerebritis/meningitis, or parasitic infection.  Patient was admitted to hospital service and ID was consulted.  He then revealed to [place] that he has lost his vision in his right eye.   ID started him on penicillin and ganciclovir for possible CMV retinitis versus synovitis retinitis and ophthalmology also saw him.  07/30/2019-status post lumbar puncture by neurology  11/4 - 11/6 -remains afebrile, no changes right eyesight blindness, ID continues to follow very closely, no confirmatory infection organism, was started on IV steroids per ID Ophthalmology also following closely He remains encephalopathic, confused poor p.o. intake, mild hypotensive, with mild bradycardia ---------------------------------------------------------------------------------------------------------------------------------------------  Subjective:  The patient was seen and examined this morning, remained stable confused, at times agitated per nursing staff mentation wax and wanes. Per nursing staff continues to have poor p.o. intake. Had an episode of mild hypotension, but remained  hemodynamically stable Patient reports of no change in her right eyesight, still completely blind.. Nursing staff also reports oral thrush.   Assessment & Plan:   Principal Problem:   Vision loss Active Problems:   Late syphilis   Acquired immunodeficiency syndrome (HCC)   Bradycardia   Hyponatremia   HIV (human immunodeficiency virus infection) (HCC)   Abnormal brain MRI  Meningitis/intracranial infection / right eye blindness in a patient with untreated HIV/AIDS infection/dizziness: Encephalopathic likely due to intracranial infection ID concerning differential no TB meningitis   -He remains on current regimen with no changes  Overnight had a mild hypotensive state, responded to IV fluid resuscitation -Remains afebrile mildly hypotensive mildly bradycardic  -Mentation waxes and wanes with confusion agitation  -Status post LP 07/30/2019: low glucose < 50% serum and very elevated protein > 600. 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   Hepatitis panel negative.   Significantly elevated toxoplasma IgG.     -On IV steroids -Continue current antibiotics, day 5: IV penicillin/ganciclovir/  Day 3 : Rifampin, isoniazid, pyrazinamide, ethambutol, pyridoxine  HIV/AIDS  -Currently ID holding antiviral treatment.  Encephalopathy with confusion/agitation -Likely due to meningitis, intracranial infection -ID added to steroid today, dexamethasone 12 mg/kg daily for 3 weeks with taper for next 3-5 weeks -Appreciate neurology input -We will add as needed Ativan/Haldol/    Mononuclear ophthalmopathy: -Appreciate ophthalmology following closely and recommendation  Either secondary to CMV retinitis versus site for his retinitis.   On ganciclovir and high-dose penicillins per ID.  Seen by ophthalmology.  Mild hyponatremia: Serum sodium 1-1 30, continue normal saline  Sinus bradycardia : -  Monitoring - He was seen in the ED on 07/22/2019 and EKG was done and he was not bradycardic.  -We will continue to monitor closely  Failure to thrive  -Due to encephalopathy, poor p.o. intake, poor appetite -Continue verbal cues, encourage p.o. intake. -Continue current IV fluids   Due to above findings, progressive decline in mentation, p.o. intake, intracranial/opportunistic infection in setting of untreated HIV AIDS anticipating poor prognosis - palliative care team has been consulted.     DVT prophylaxis: Lovenox (was held LP) Code Status: Full code Family Communication:  None present at bedside.  Plan of care discussed with patient in length and he verbalized understanding and agreed with it. Disposition Plan: TBD  Estimated body mass index is 16.94 kg/m as calculated from the following:   Height as of this encounter: 5\' 11"  (1.803 m).   Weight as of this encounter: 55.1 kg.      Nutritional status: Poor p.o. 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) (+) serum Toxo IgG  opthal more c/w CMV retinitis Cryptococcal serum Ag (-) Cryptococcal CSF Ag (-)  AFB smear/culture and other diagnostic studies    Objective: Vitals:   08/01/19 2006 08/01/19 2337 08/02/19 0454 08/02/19 0615  BP: (!) 99/59 102/77 (!) 93/54   Pulse: (!) 56 (!) 50    Resp:  17 19   Temp:  (!) 97.5 F (36.4 C) 97.7 F (36.5 C)   TempSrc:  Oral Oral   SpO2: 99% 96% 95%   Weight:    55.1 kg  Height:        Intake/Output Summary (Last 24 hours) at 08/02/2019 1331 Last data filed at 08/02/2019 0610 Gross per 24 hour  Intake 1356.41 ml  Output 3400 ml   Net -2043.59 ml   Filed Weights   07/29/19 0753 07/30/19 0546 08/02/19 0615  Weight: 57 kg 57.6 kg 55.1 kg    Examination: BP (!) 93/54 (BP Location: Left Arm)   Pulse (!) 50   Temp 97.7 F (36.5 C) (Oral)   Resp 19   Ht 5\' 11"  (1.803 m)   Wt 55.1 kg   SpO2 95%   BMI 16.94 kg/m    Physical Exam  Constitution:  Alert, remains agitated, at times confused, Mentation continues to wax and wane HEENT: Right eye blindness, no changes, normocephalic, otherwise with in Normal limits  Chest:Chest symmetric Cardio vascular:  S1/S2, RRR, No murmure, No Rubs or Gallops  pulmonary: Clear to auscultation bilaterally, respirations unlabored, negative wheezes / crackles Abdomen: Soft, non-tender, non-distended, bowel sounds,no masses, no organomegaly Muscular skeletal: Limited exam - in bed, able to move all 4 extremities, Normal strength,  Neuro: CNII-XII intact. , normal motor and sensation, reflexes intact  Extremities: No pitting edema lower extremities, +2 pulses  Skin: Dry, warm to touch, negative for any Rashes, No open wounds Wounds: per nursing documentation          Data Reviewed: I have personally reviewed following labs and imaging studies  CBC: Recent Labs  Lab 07/28/19 2201 07/29/19 0332 07/30/19 1113 08/01/19 0310 08/02/19 0335  WBC 1.9* 2.7* 2.0* 4.5 3.6*  NEUTROABS 0.8*  --  1.3*  --   --   HGB 14.2 14.4 13.5 13.8 13.1  HCT 42.8 42.6 39.7 39.2 36.7*  MCV 90.7 89.5 87.8 85.8  85.3  PLT 272 240 255 257 270   Basic Metabolic Panel: Recent Labs  Lab 07/28/19 1059 07/29/19 0332 07/30/19 1113 08/01/19 0310 08/02/19 0335  NA 132* 134* 130* 129* 134*  K 4.3 4.4 4.2 4.7 4.3  CL 99 104 99 103 109  CO2 25 25 23  19* 20*  GLUCOSE 89 96 103* 109* 111*  BUN 14 16 9 13 11   CREATININE 1.01 0.87 0.85 0.88 0.78  CALCIUM 9.5 8.9 9.0 9.0 9.1  MG  --  2.0 1.7  --   --   PHOS  --  3.1  --   --   --    GFR: Estimated Creatinine Clearance: 99.5 mL/min (by C-G  formula based on SCr of 0.78 mg/dL). Liver Function Tests: Recent Labs  Lab 07/29/19 0332 08/01/19 0310 08/02/19 0335  AST 30 24 23   ALT 16 14 14   ALKPHOS 49 45 44  BILITOT 0.4 0.6 0.2*  PROT 8.3* 7.9 8.3*  ALBUMIN 2.9* 2.6* 2.6*   No results for input(s): LIPASE, AMYLASE in the last 168 hours. No results for input(s): AMMONIA in the last 168 hours. Coagulation Profile: Recent Labs  Lab 07/30/19 0852  INR 1.1    Recent Results (from the past 240 hour(s))  SARS CORONAVIRUS 2 (TAT 6-24 HRS) Nasopharyngeal Nasopharyngeal Swab     Status: None   Collection Time: 07/28/19  8:24 PM   Specimen: Nasopharyngeal Swab  Result Value Ref Range Status   SARS Coronavirus 2 NEGATIVE NEGATIVE Final    Comment: (NOTE) SARS-CoV-2 target nucleic acids are NOT DETECTED. The SARS-CoV-2 RNA is generally detectable in upper and lower respiratory specimens during the acute phase of infection. Negative results do not preclude SARS-CoV-2 infection, do not rule out co-infections with other pathogens, and should not be used as the sole basis for treatment or other patient management decisions. Negative results must be combined with clinical observations, patient history, and epidemiological information. The expected result is Negative. Fact Sheet for Patients: HairSlick.nohttps://www.fda.gov/media/138098/download Fact Sheet for Healthcare Providers: quierodirigir.comhttps://www.fda.gov/media/138095/download This test is not yet approved or cleared by the [place] FDA and  has been authorized for detection and/or diagnosis of SARS-CoV-2 by FDA under an Emergency Use Authorization (EUA). This EUA will remain  in effect (meaning this test can be used) for the duration of the COVID-19 declaration under Section 56 4(b)(1) of the Act, 21 U.S.C. section 360bbb-3(b)(1), unless the authorization is terminated or revoked sooner. Performed at [facility], [address] [place], [place] [address]   CSF culture with  Stat gram stain     Status: None   Collection Time: 07/30/19  1:46 PM   Specimen: CSF; Cerebrospinal Fluid  Result Value Ref Range Status   Specimen Description CSF  Final   Special Requests NONE  Final   Gram Stain   Final    WBC PRESENT, PREDOMINANTLY MONONUCLEAR NO ORGANISMS SEEN CYTOSPIN SMEAR    Culture   Final    NO GROWTH 3 DAYS Performed at [facility], [address] [place], [place] [address]    Report Status 08/02/2019 FINAL  Final  Acid Fast Smear (AFB)     Status: None   Collection Time: 07/30/19  1:46 PM  Result Value Ref Range Status   AFB Specimen Processing Concentration  Final   Acid Fast Smear Negative  Final    Comment: (NOTE) Performed At: [facility] [place] [address] [place], [place] [id number] Jolene SchimkeNagendra Sanjai MD WG:[phone]  Source (AFB) CSF  Final    Comment: Performed at [facility], [address] [place], [place] [address]      Radiology Studies: No results found.  Scheduled Meds: . ethambutol  1,200 mg Oral Daily  . feeding supplement  1 Container Oral BID WC  . feeding supplement (ENSURE ENLIVE)  237 mL Oral BID BM  . influenza vac split quadrivalent PF  0.5 mL Intramuscular Tomorrow-1000  . isoniazid  300 mg Oral Daily  . mouth rinse  15 mL Mouth Rinse BID  . multivitamin with minerals  1 tablet Oral Daily  . pyrazinamide  1,500 mg Oral Daily  . vitamin B-6  50 mg Oral Daily  . rifampin  600 mg Oral Daily  . sodium chloride flush  3 mL Intravenous Once  . sodium chloride flush  3 mL Intravenous Q12H  . sulfamethoxazole-trimethoprim  1 tablet Oral Daily   Continuous Infusions: . sodium chloride 10 mL/hr at 07/29/19 0742  . dexamethasone (DECADRON) IVPB ([facility]) Stopped (08/01/19 1122)  . dextrose 5 % and 0.9% NaCl 125 mL/hr at 08/02/19 0536  . ganciclovir 285 mg (08/02/19 0908)  . penicillin g continuous IV infusion 12 Million Units (08/02/19 0453)     LOS: 4 days   Time spent: 29 minutes   Kendell Bane, MD [facility]  08/02/2019, 1:31 PM   To contact the attending provider between 7A-7P or the covering provider during after hours 7P-7A, please log into the web site [email or web address] and use password [email or web address].

## 2019-08-03 ENCOUNTER — Inpatient Hospital Stay (HOSPITAL_COMMUNITY): Payer: Medicaid Other

## 2019-08-03 DIAGNOSIS — A529 Late syphilis, unspecified: Secondary | ICD-10-CM

## 2019-08-03 DIAGNOSIS — G009 Bacterial meningitis, unspecified: Secondary | ICD-10-CM

## 2019-08-03 DIAGNOSIS — H3091 Unspecified chorioretinal inflammation, right eye: Secondary | ICD-10-CM

## 2019-08-03 DIAGNOSIS — B259 Cytomegaloviral disease, unspecified: Secondary | ICD-10-CM

## 2019-08-03 DIAGNOSIS — H469 Unspecified optic neuritis: Secondary | ICD-10-CM

## 2019-08-03 DIAGNOSIS — A523 Neurosyphilis, unspecified: Secondary | ICD-10-CM

## 2019-08-03 DIAGNOSIS — H3090 Unspecified chorioretinal inflammation, unspecified eye: Secondary | ICD-10-CM

## 2019-08-03 LAB — COMPREHENSIVE METABOLIC PANEL
ALT: 17 U/L (ref 0–44)
AST: 22 U/L (ref 15–41)
Albumin: 2.9 g/dL — ABNORMAL LOW (ref 3.5–5.0)
Alkaline Phosphatase: 47 U/L (ref 38–126)
Anion gap: 6 (ref 5–15)
BUN: 9 mg/dL (ref 6–20)
CO2: 21 mmol/L — ABNORMAL LOW (ref 22–32)
Calcium: 9.3 mg/dL (ref 8.9–10.3)
Chloride: 105 mmol/L (ref 98–111)
Creatinine, Ser: 0.81 mg/dL (ref 0.61–1.24)
GFR calc Af Amer: >60 mL/min (ref >60)
GFR calc non Af Amer: >60 mL/min (ref >60)
Glucose, Bld: 114 mg/dL — ABNORMAL HIGH (ref 70–99)
Potassium: 4.6 mmol/L (ref 3.5–5.1)
Sodium: 132 mmol/L — ABNORMAL LOW (ref 135–145)
Total Bilirubin: 0.6 mg/dL (ref 0.3–1.2)
Total Protein: 8.4 g/dL — ABNORMAL HIGH (ref 6.5–8.1)

## 2019-08-03 LAB — CBC
HCT: 37.8 % — ABNORMAL LOW (ref 39.0–52.0)
Hemoglobin: 13.2 g/dL (ref 13.0–17.0)
MCH: 30.1 pg (ref 26.0–34.0)
MCHC: 34.9 g/dL (ref 30.0–36.0)
MCV: 86.3 fL (ref 80.0–100.0)
Platelets: 278 10*3/uL (ref 150–400)
RBC: 4.38 MIL/uL (ref 4.22–5.81)
RDW: 13.2 % (ref 11.5–15.5)
WBC: 3.2 10*3/uL — ABNORMAL LOW (ref 4.0–10.5)
nRBC: 0 % (ref 0.0–0.2)

## 2019-08-03 LAB — MISC LABCORP TEST (SEND OUT): Labcorp test code: 138693

## 2019-08-03 IMAGING — DX DG CHEST 1V
1 series · 1 of 1 positions shown · non-contrast
Comparison: Chest radiograph [DATE]

CLINICAL DATA: SOB

EXAM:
CHEST  1 VIEW

[chest ap]
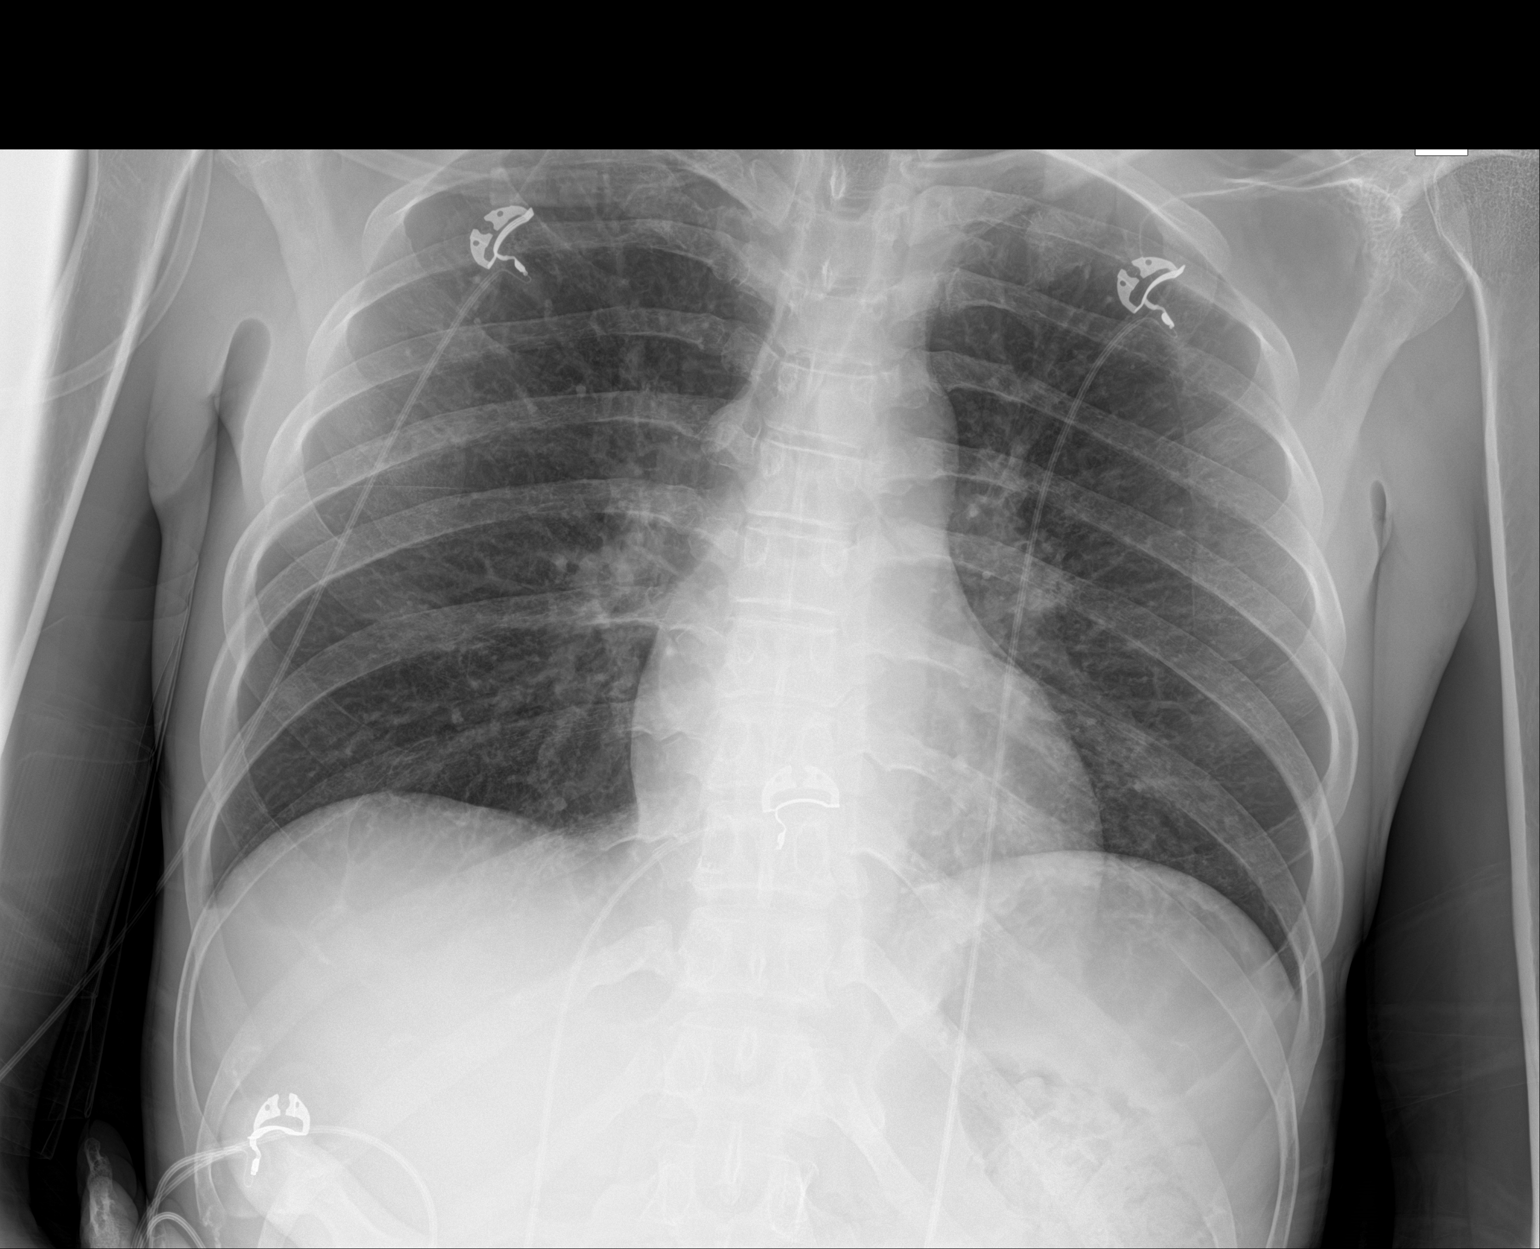

[1 of 1 positions shown; findings below may reference images not displayed]

FINDINGS: The heart size and mediastinal contours are within normal limits.
The lungs are clear. No pneumothorax or pleural effusion. The
visualized skeletal structures are unremarkable.
IMPRESSION: No acute cardiopulmonary process.

## 2019-08-03 MED ORDER — MORPHINE SULFATE (PF) 2 MG/ML IV SOLN
2.0000 mg | Freq: Four times a day (QID) | INTRAVENOUS | Status: DC | PRN
  Administered 2019-08-03: 2 mg via INTRAVENOUS
  Filled 2019-08-03: qty 1

## 2019-08-03 MED ORDER — CHLORPROMAZINE HCL 10 MG PO TABS: 10.0000 mg | ORAL_TABLET | Freq: Four times a day (QID) | ORAL | Status: DC | PRN

## 2019-08-03 MED ORDER — FLUCONAZOLE IN SODIUM CHLORIDE 200-0.9 MG/100ML-% IV SOLN
200.0000 mg | INTRAVENOUS | Status: DC
  Administered 2019-08-05 – 2019-08-12 (×9): 200 mg via INTRAVENOUS
  Filled 2019-08-03 (×12): qty 100

## 2019-08-03 MED ORDER — NYSTATIN 100000 UNIT/ML MT SUSP
5.0000 mL | Freq: Four times a day (QID) | OROMUCOSAL | Status: DC
  Administered 2019-08-03 (×2): 500000 [IU] via ORAL
  Filled 2019-08-03 (×2): qty 5

## 2019-08-03 MED ORDER — PHENOL 1.4 % MT LIQD: 1.0000 | OROMUCOSAL | Status: DC | PRN

## 2019-08-03 MED ORDER — PROMETHAZINE HCL 25 MG/ML IJ SOLN
12.5000 mg | Freq: Once | INTRAMUSCULAR | Status: AC
  Administered 2019-08-03: 12.5 mg via INTRAVENOUS
  Filled 2019-08-03: qty 1

## 2019-08-03 MED ORDER — ENOXAPARIN SODIUM 40 MG/0.4ML ~~LOC~~ SOLN
40.0000 mg | SUBCUTANEOUS | Status: DC
  Administered 2019-08-04 – 2019-08-31 (×28): 40 mg via SUBCUTANEOUS
  Filled 2019-08-03 (×29): qty 0.4

## 2019-08-03 NOTE — Progress Notes (Signed)
PROGRESS NOTE    Arthur [name]  EXH:[id number] DOB: 03-13-1981 DOA: 07/28/2019 PCP: Truman Hayward, MD    Brief Narrative:  Arthur [name] a 38 y.o.malewith medical history significant of untreatedHIV, anemia, depression, GERD, substance abuse presented to ED on 07/28/2019 with a complaint of dizziness, weakness, intermittent headache.  He has not taken any of his HIV medications for about 1 year due to affordability issue.  MRI brain done in ED showed Widespread areas of abnormal low level restricted diffusion and postcontrast enhancement throughout both cerebral hemispheres, predominantly infratentorial, with the dominant abnormality in the LEFT anterior frontal white matter consistent with an opportunistic infection, such as toxoplasmosis, tuberculosis or fungal cerebritis/meningitis, or parasitic infection.  Patient was admitted to hospital service and ID was consulted.  He then revealed to [place] that he has lost his vision in his right eye.  ID started him on penicillin and ganciclovir for possible CMV retinitis versus synovitis retinitis and ophthalmology also saw him.  07/30/2019-status post lumbar puncture by neurology .  11/4 - 11/6 -remains afebrile, no changes right eyesight blindness, ID continues to follow very closely, no confirmatory infection organism, ophthalmology also following closely He remains encephalopathic, confused poor p.o. intake, mild hypotensive,   Assessment & Plan:   Principal Problem:   Vision loss Active Problems:   Late syphilis   Acquired immunodeficiency syndrome (HCC)   Bradycardia   Hyponatremia   HIV (human immunodeficiency virus infection) ([place])   Abnormal brain MRI   Palliative care encounter  Meningitis/intracranial infection/right eye blindness in a patient with untreated HIV AIDS and infective encephalopathy: Remains on broad-spectrum treatment for opportunistic infection.  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 Using as needed Haldol and bedside sitter for safety and fall risk. Currently not on antiretrovirals pending clinical improvement.  Acute metabolic encephalopathy: Due to #1.  As needed Ativan and Haldol.  Appreciate neurology input.  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  Treating as above.  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  Seen by palliative care.  Currently recommending continuing aggressive measures.   DVT prophylaxis: Lovenox Code Status: Full code Family Communication: None Disposition Plan: Remains in the hospital   Consultants:   Neurology  Infectious disease  Palliative care  Procedures:   Lumbar puncture  Antimicrobials:  ethambutol, isoniazid, rifampicin and pyridoxine, pyrazinamide for tuberculosis High-dose penicillin for neurosyphilis Ganciclovir for HSV encephalitis Bactrim for prophylaxis  Subjective: Patient seen and examined.  Was quiet, not very interactive.  No overnight events.  Remains occasionally confused with sitter at the bedside.  Objective: Vitals:   08/02/19 0454 08/02/19 0615 08/02/19 2008 08/03/19 0715  BP: (!) 93/54  108/62 121/65  Pulse:   (!) 55   Resp: 19  18   Temp: 97.7 F (36.5 C)   (!) 97.4 F (36.3 C)  TempSrc: Oral   Axillary  SpO2: 95%     Weight:  55.1 kg    Height:        Intake/Output Summary (Last 24 hours) at 08/03/2019 1114 Last data filed at 08/03/2019 0154 Gross per 24 hour  Intake -  Output 1180 ml  Net -1180 ml   Filed Weights   07/29/19 0753 07/30/19 0546 08/02/19 0615  Weight: 57 kg 57.6 kg 55.1 kg    Examination:  General exam: Appears calm and comfortable, chronically sick looking.  Not in any distress. Respiratory  system: Clear to auscultation. Respiratory effort normal. Cardiovascular system:  S1 & S2 heard, RRR. No pedal edema. Gastrointestinal system: Abdomen is nondistended, soft and nontender. Central nervous system: Alert and oriented. No focal neurological deficits. Extremities: Symmetric 5 x 5 power.  Generalized weakness. Skin: No rashes, lesions or ulcers Psychiatry: Judgement and insight appear compromised.  Mood & affect flat.    Data Reviewed: I have personally reviewed following labs and imaging studies  CBC: Recent Labs  Lab 07/28/19 2201 07/29/19 0332 07/30/19 1113 08/01/19 0310 08/02/19 0335 08/03/19 0258  WBC 1.9* 2.7* 2.0* 4.5 3.6* 3.2*  NEUTROABS 0.8*  --  1.3*  --   --   --   HGB 14.2 14.4 13.5 13.8 13.1 13.2  HCT 42.8 42.6 39.7 39.2 36.7* 37.8*  MCV 90.7 89.5 87.8 85.8 85.3 86.3  PLT 272 240 255 257 270 278   Basic Metabolic Panel: Recent Labs  Lab 07/29/19 0332 07/30/19 1113 08/01/19 0310 08/02/19 0335 08/03/19 0258  NA 134* 130* 129* 134* 132*  K 4.4 4.2 4.7 4.3 4.6  CL 104 99 103 109 105  CO2 25 23 19* 20* 21*  GLUCOSE 96 103* 109* 111* 114*  BUN 16 9 13 11 9   CREATININE 0.87 0.85 0.88 0.78 0.81  CALCIUM 8.9 9.0 9.0 9.1 9.3  MG 2.0 1.7  --   --   --   PHOS 3.1  --   --   --   --    GFR: Estimated Creatinine Clearance: 98.3 mL/min (by C-G formula based on SCr of 0.81 mg/dL). Liver Function Tests: Recent Labs  Lab 07/29/19 0332 08/01/19 0310 08/02/19 0335 08/03/19 0258  AST 30 24 23 22   ALT 16 14 14 17   ALKPHOS 49 45 44 47  BILITOT 0.4 0.6 0.2* 0.6  PROT 8.3* 7.9 8.3* 8.4*  ALBUMIN 2.9* 2.6* 2.6* 2.9*   No results for input(s): LIPASE, AMYLASE in the last 168 hours. No results for input(s): AMMONIA in the last 168 hours. Coagulation Profile: Recent Labs  Lab 07/30/19 0852  INR 1.1   Cardiac Enzymes: No results for input(s): CKTOTAL, CKMB, CKMBINDEX, TROPONINI in the last 168 hours. BNP (last 3 results) No results for input(s): PROBNP in the last 8760 hours. HbA1C: No results for input(s): HGBA1C in the last 72  hours. CBG: No results for input(s): GLUCAP in the last 168 hours. Lipid Profile: No results for input(s): CHOL, HDL, LDLCALC, TRIG, CHOLHDL, LDLDIRECT in the last 72 hours. Thyroid Function Tests: No results for input(s): TSH, T4TOTAL, FREET4, T3FREE, THYROIDAB in the last 72 hours. Anemia Panel: No results for input(s): VITAMINB12, FOLATE, FERRITIN, TIBC, IRON, RETICCTPCT in the last 72 hours. Sepsis Labs: No results for input(s): PROCALCITON, LATICACIDVEN in the last 168 hours.  Recent Results (from the past 240 hour(s))  SARS CORONAVIRUS 2 (TAT 6-24 HRS) Nasopharyngeal Nasopharyngeal Swab     Status: None   Collection Time: 07/28/19  8:24 PM   Specimen: Nasopharyngeal Swab  Result Value Ref Range Status   SARS Coronavirus 2 NEGATIVE NEGATIVE Final    Comment: (NOTE) SARS-CoV-2 target nucleic acids are NOT DETECTED. The SARS-CoV-2 RNA is generally detectable in upper and lower respiratory specimens during the acute phase of infection. Negative results do not preclude SARS-CoV-2 infection, do not rule out co-infections with other pathogens, and should not be used as the sole basis for treatment or other patient management decisions. Negative results must be combined with clinical observations, patient history, and epidemiological information. The expected  result is Negative. Fact Sheet for Patients: HairSlick.nohttps://www.fda.gov/media/138098/download Fact Sheet for Healthcare Providers: quierodirigir.comhttps://www.fda.gov/media/138095/download This test is not yet approved or cleared by the [place] FDA and  has been authorized for detection and/or diagnosis of SARS-CoV-2 by FDA under an Emergency Use Authorization (EUA). This EUA will remain  in effect (meaning this test can be used) for the duration of the COVID-19 declaration under Section 56 4(b)(1) of the Act, 21 U.S.C. section 360bbb-3(b)(1), unless the authorization is terminated or revoked sooner. Performed at [facility],  [address] [place], [place] [address]   CSF culture with Stat gram stain     Status: None   Collection Time: 07/30/19  1:46 PM   Specimen: CSF; Cerebrospinal Fluid  Result Value Ref Range Status   Specimen Description CSF  Final   Special Requests NONE  Final   Gram Stain   Final    WBC PRESENT, PREDOMINANTLY MONONUCLEAR NO ORGANISMS SEEN CYTOSPIN SMEAR    Culture   Final    NO GROWTH 3 DAYS Performed at [facility], [address] [place], [place] [address]    Report Status 08/02/2019 FINAL  Final  Acid Fast Smear (AFB)     Status: None   Collection Time: 07/30/19  1:46 PM  Result Value Ref Range Status   AFB Specimen Processing Concentration  Final   Acid Fast Smear Negative  Final    Comment: (NOTE) Performed At: [facility] [address] [place], [place] [id number] Jolene SchimkeNagendra Sanjai MD WG:[id number]:[phone]    Source (AFB) CSF  Final    Comment: Performed at [facility], [address] [place], [place] [address]         Radiology Studies: No results found.      Scheduled Meds: . ethambutol  1,200 mg Oral Daily  . feeding supplement  1 Container Oral BID WC  . feeding supplement (ENSURE ENLIVE)  237 mL Oral BID BM  . isoniazid  300 mg Oral Daily  . mouth rinse  15 mL Mouth Rinse BID  . multivitamin with minerals  1 tablet Oral Daily  . pyrazinamide  1,500 mg Oral Daily  . vitamin B-6  50 mg Oral Daily  . rifampin  600 mg Oral Daily  . sodium chloride flush  3 mL Intravenous Once  . sodium chloride flush  3 mL Intravenous Q12H  . sulfamethoxazole-trimethoprim  1 tablet Oral Daily   Continuous Infusions: . sodium chloride 10 mL/hr at 07/29/19 0742  . dexamethasone (DECADRON) IVPB ([facility]) 25 mg (08/02/19 1300)  . dextrose 5 % and 0.9% NaCl 125 mL/hr at 08/03/19 0417  . ganciclovir 285 mg (08/03/19 1108)  . penicillin g continuous IV infusion 12 Million Units (08/03/19 0418)     LOS: 5 days    Time spent: 30 minutes    Dorcas CarrowKuber  Senica Crall, MD [facility] Pager [phone]

## 2019-08-03 NOTE — Progress Notes (Signed)
PRN Haldol given x 2 for increased agitation was effective. Pt attempted to exit the bed several times and also tried to pull out his IV. Mittens applied and was helpful.

## 2019-08-03 NOTE — Progress Notes (Signed)
Daily Progress Note   Patient Name: Arthur [name]       Date: 08/03/2019 DOB: 08/10/1981  Age: 38 y.o. MRN#: [id number] Attending Physician: Dorcas Carrow, MD Primary Care Physician: [name], [name], MD Admit Date: 07/28/2019  Reason for Consultation/Follow-up: Establishing goals of care and Psychosocial/spiritual support  Subjective: Patient not speaking because his throat is sore.  Mother (Arthur [name]) is at bedside.  She is a taxi driver and is here as often as she possibly can be.  We discussed the MR of his eye in the next few days and the probable repeat lumbar puncture next week.  She understands and appreciates the medical care.  I explain that we are treating for several possible infections while we are trying to determine which are actually in play.  We are uncertain of what his functional status will be once he has been treated and recovered.  Arthur [name] tells me Arthur [name] has never been in the hospital before.  She understands what I'm telling her and appreciates the medical care.  Assessment: Patient lethargic.  Non verbally indicates he has chest and throat pain.   Patient Profile/HPI: 38 y.o. male with past medical history of HIV, polysubstance abuse, anxiety, depression, and anemia who was admitted on 07/28/2019 with dizziness and weakness.    On admission he reported that he had been unable to afford his HIV medications for the past year.  He was admitted for evaluation and treatment of opportunistic infection.  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     Length of Stay: 5  Current Medications: Scheduled Meds:  . enoxaparin (LOVENOX) injection  40 mg Subcutaneous Q24H  .  ethambutol  1,200 mg Oral Daily  . feeding supplement  1 Container Oral BID WC  . feeding supplement (ENSURE ENLIVE)  237 mL Oral BID BM  . isoniazid  300 mg Oral Daily  . mouth rinse  15 mL Mouth Rinse BID  . multivitamin with minerals  1 tablet Oral Daily  . nystatin  5 mL Oral QID  . pyrazinamide  1,500 mg Oral Daily  . vitamin B-6  50 mg Oral Daily  . rifampin  600 mg Oral Daily  . sodium chloride flush  3  mL Intravenous Once  . sodium chloride flush  3 mL Intravenous Q12H  . sulfamethoxazole-trimethoprim  1 tablet Oral Daily    Continuous Infusions: . sodium chloride 10 mL/hr at 07/29/19 0742  . dexamethasone (DECADRON) IVPB ([facility]) 25 mg (08/02/19 1300)  . dextrose 5 % and 0.9% NaCl 125 mL/hr at 08/03/19 0417  . fluconazole (DIFLUCAN) IV    . ganciclovir 285 mg (08/03/19 1108)  . penicillin g continuous IV infusion 12 Million Units (08/03/19 0418)    PRN Meds: sodium chloride, acetaminophen **OR** acetaminophen, haloperidol lactate, ondansetron **OR** ondansetron (ZOFRAN) IV, phenol, sodium chloride flush  Physical Exam       Well developed thin young male.  Sitting up in bed. Awake but eyes closed. Not speaking CV rrr no m/r/g resp no distress, no w/c/r Abdomen soft nt, nd  Vital Signs: BP 102/65 (BP Location: Right Arm)   Pulse 70   Temp (!) 97.4 F (36.3 C) (Axillary)   Resp 18   Ht 5\' 11"  (1.803 m)   Wt 55.1 kg   SpO2 93%   BMI 16.94 kg/m  SpO2: SpO2: 93 % O2 Device: O2 Device: Room Air O2 Flow Rate:    Intake/output summary:   Intake/Output Summary (Last 24 hours) at 08/03/2019 1551 Last data filed at 08/03/2019 1300 Gross per 24 hour  Intake 180 ml  Output 1780 ml  Net -1600 ml   LBM: Last BM Date: 07/28/19 Baseline Weight: Weight: 57 kg Most recent weight: Weight: 55.1 kg       Palliative Assessment/Data:  50%      Patient Active Problem List   Diagnosis Date Noted  . Palliative care encounter   . HIV (human immunodeficiency virus  infection) ([place]) 07/29/2019  . Vision loss 07/29/2019  . Abnormal brain MRI 07/29/2019  . Acquired immunodeficiency syndrome ([place]) 07/28/2019  . Bradycardia 07/28/2019  . Hyponatremia 07/28/2019  . Nausea and vomiting 03/25/2013  . Muscle spasm 03/25/2013  . Drug use 01/11/2012  . 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 COUGH 06/23/2009  . CHEST PAIN, PLEURITIC 06/23/2009  . FUNGAL DERMATITIS 05/28/2008  . GERD 05/28/2008  . DENTAL CARIES 02/13/2008  . NICOTINE ADDICTION 11/14/2007  . BACK STRAIN, LUMBAR 08/22/2007  . 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 WEIGHT LOSS, RECENT 05/01/2007  . ANXIETY DEPRESSION 02/14/2007  . HIV DISEASE 02/13/2007  . CERVICAL LYMPHADENOPATHY, ANTERIOR, RIGHT 02/13/2007    Palliative Care Plan    Recommendations/Plan:  Discussed with Dr. Tommy Medal will initiate fluconazole 200 mg daily for thrush  Too soon to know what his new baseline will be after this is treated.  Will continue to follow intermittently with you and support patient and family.  Goals of Care and Additional Recommendations:  Limitations on Scope of Treatment: Full Scope Treatment  Code Status:  Full code  Prognosis:   Unable to determine   Discharge Planning:  To Be Determined.  Insurance issues aside he would likely be a good CIR candidate.  He has excellent family support.  Care plan was discussed with Drs. [employer] and [facility].  Thank you for allowing the Palliative Medicine Team to assist in the care of this patient.  Total time spent:  35 min     Greater than 50%  of this time was spent counseling and coordinating care related to the above assessment and plan.  Florentina Jenny, PA-C Palliative Medicine  Please contact Palliative MedicineTeam phone at [phone] for questions and concerns between 7 am - 7 pm.  Please see AMION for individual provider pager numbers.

## 2019-08-03 NOTE — Progress Notes (Signed)
Subjective: No new complaints   Antibiotics:  Anti-infectives (From admission, onward)   Start     Dose/Rate Route Frequency Ordered Stop   07/30/19 1700  rifampin (RIFADIN) capsule 600 mg     600 mg Oral Daily 07/30/19 1604     07/30/19 1700  isoniazid (NYDRAZID) tablet 300 mg     300 mg Oral Daily 07/30/19 1604     07/30/19 1700  pyrazinamide tablet 1,500 mg     1,500 mg Oral Daily 07/30/19 1604     07/30/19 1700  ethambutol (MYAMBUTOL) tablet 1,200 mg     1,200 mg Oral Daily 07/30/19 1604     07/29/19 1700  sulfamethoxazole-trimethoprim (BACTRIM DS) 800-160 MG per tablet 1 tablet     1 tablet Oral Daily 07/29/19 1601     07/29/19 1700  ganciclovir (CYTOVENE) 285 mg in sodium chloride 0.9 % 100 mL IVPB     5 mg/kg  57 kg 100 mL/hr over 60 Minutes Intravenous Every 12 hours 07/29/19 1603     07/29/19 1630  penicillin G potassium 12 Million Units in dextrose 5 % 250 mL IVPB  Status:  Discontinued     12 Million Units 250 mL/hr over 60 Minutes Intravenous Every 12 hours 07/29/19 1552 07/29/19 1559   07/29/19 1630  penicillin G potassium 12 Million Units in dextrose 5 % 500 mL continuous infusion     12 Million Units 41.7 mL/hr over 12 Hours Intravenous Every 12 hours 07/29/19 1559        Medications: Scheduled Meds: . ethambutol  1,200 mg Oral Daily  . feeding supplement  1 Container Oral BID WC  . feeding supplement (ENSURE ENLIVE)  237 mL Oral BID BM  . isoniazid  300 mg Oral Daily  . mouth rinse  15 mL Mouth Rinse BID  . multivitamin with minerals  1 tablet Oral Daily  . pyrazinamide  1,500 mg Oral Daily  . vitamin B-6  50 mg Oral Daily  . rifampin  600 mg Oral Daily  . sodium chloride flush  3 mL Intravenous Once  . sodium chloride flush  3 mL Intravenous Q12H  . sulfamethoxazole-trimethoprim  1 tablet Oral Daily   Continuous Infusions: . sodium chloride 10 mL/hr at 07/29/19 0742  . dexamethasone (DECADRON) IVPB ([facility]) 25 mg (08/02/19 1300)  . dextrose  5 % and 0.9% NaCl 125 mL/hr at 08/03/19 0417  . ganciclovir 285 mg (08/02/19 2130)  . penicillin g continuous IV infusion 12 Million Units (08/03/19 0418)   PRN Meds:.sodium chloride, acetaminophen **OR** acetaminophen, haloperidol lactate, ondansetron **OR** ondansetron (ZOFRAN) IV, sodium chloride flush    Objective: Weight change:   Intake/Output Summary (Last 24 hours) at 08/03/2019 1102 Last data filed at 08/03/2019 0154 Gross per 24 hour  Intake -  Output 1180 ml  Net -1180 ml   Blood pressure 121/65, pulse (!) 55, temperature (!) 97.4 F (36.3 C), temperature source Axillary, resp. rate 18, height 5\' 11"  (1.803 m), weight 55.1 kg, SpO2 95 %. Temp:  [97.4 F (36.3 C)] 97.4 F (36.3 C) (11/07 0715) Pulse Rate:  [55] 55 (11/06 2008) Resp:  [18] 18 (11/06 2008) BP: (108-121)/(62-65) 121/65 (11/07 0715)  Physical Exam: General: Alert and awake oriented to person and place and claims to recognize me  HEENT:right eye deviates outwards at times CVS regular rate, normal , no murmurs gallops or rubs Chest: , no wheezing, no respiratory distress, there to auscultation bilaterally Abdomen: soft non-distended,  Extremities: Wearing mittens to restrain him Skin: no rashes Neuro: right eye deviates outwards at times  CBC:    BMET Recent Labs    08/02/19 0335 08/03/19 0258  NA 134* 132*  K 4.3 4.6  CL 109 105  CO2 20* 21*  GLUCOSE 111* 114*  BUN 11 9  CREATININE 0.78 0.81  CALCIUM 9.1 9.3     Liver Panel  Recent Labs    08/02/19 0335 08/03/19 0258  PROT 8.3* 8.4*  ALBUMIN 2.6* 2.9*  AST 23 22  ALT 14 17  ALKPHOS 44 47  BILITOT 0.2* 0.6       Sedimentation Rate No results for input(s): ESRSEDRATE in the last 72 hours. C-Reactive Protein No results for input(s): CRP in the last 72 hours.  Micro Results: Recent Results (from the past 720 hour(s))  SARS CORONAVIRUS 2 (TAT 6-24 HRS) Nasopharyngeal Nasopharyngeal Swab     Status: None   Collection  Time: 07/28/19  8:24 PM   Specimen: Nasopharyngeal Swab  Result Value Ref Range Status   SARS Coronavirus 2 NEGATIVE NEGATIVE Final    Comment: (NOTE) SARS-CoV-2 target nucleic acids are NOT DETECTED. The SARS-CoV-2 RNA is generally detectable in upper and lower respiratory specimens during the acute phase of infection. Negative results do not preclude SARS-CoV-2 infection, do not rule out co-infections with other pathogens, and should not be used as the sole basis for treatment or other patient management decisions. Negative results must be combined with clinical observations, patient history, and epidemiological information. The expected result is Negative. Fact Sheet for Patients: HairSlick.nohttps://www.fda.gov/media/138098/download Fact Sheet for Healthcare Providers: quierodirigir.comhttps://www.fda.gov/media/138095/download This test is not yet approved or cleared by the [place] FDA and  has been authorized for detection and/or diagnosis of SARS-CoV-2 by FDA under an Emergency Use Authorization (EUA). This EUA will remain  in effect (meaning this test can be used) for the duration of the COVID-19 declaration under Section 56 4(b)(1) of the Act, 21 U.S.C. section 360bbb-3(b)(1), unless the authorization is terminated or revoked sooner. Performed at [facility], [address] [place], [place] [address]   CSF culture with Stat gram stain     Status: None   Collection Time: 07/30/19  1:46 PM   Specimen: CSF; Cerebrospinal Fluid  Result Value Ref Range Status   Specimen Description CSF  Final   Special Requests NONE  Final   Gram Stain   Final    WBC PRESENT, PREDOMINANTLY MONONUCLEAR NO ORGANISMS SEEN CYTOSPIN SMEAR    Culture   Final    NO GROWTH 3 DAYS Performed at [facility], [address] [place], [place] [address]    Report Status 08/02/2019 FINAL  Final  Acid Fast Smear (AFB)     Status: None   Collection Time: 07/30/19  1:46 PM  Result Value Ref Range Status    AFB Specimen Processing Concentration  Final   Acid Fast Smear Negative  Final    Comment: (NOTE) Performed At: [facility] [address] [place], [place] [id number] Jolene SchimkeNagendra Sanjai MD WG:[id number]:[phone]    Source (AFB) CSF  Final    Comment: Performed at [facility], [address] [place], [place] [address]    Studies/Results: No results found.    Assessment/Plan:  INTERVAL HISTORY: patient seen by ophthalmology again yesterday (Dr. Sherrine MaplesGlenn) who recommends including MRI ORBITS with repeat MRI brain this week   Principal Problem:   Vision loss Active Problems:   Late syphilis  Acquired immunodeficiency syndrome ([facility])   Bradycardia   Hyponatremia   HIV (human immunodeficiency virus infection) ([place])   Abnormal brain MRI   Palliative care encounter    Arthur [name] is a 38 y.o. male with  HIV and AIDS, known to me when I took care of him in 2015 and he had achieved virological suppression on Prezista Norvir and Truvada.  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  Unfortunately he fell out of care been off antiretrovirals for the last 4 years extensive CNS infection with multiple lesions, retinitis, and optic nerve infection.  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  Additionally he has been initiated on corticosteroids and RIPE for possible TB of CNS  #1 CNS lesions: Very broad differential.  Could all be due to neurosyphilis.  But he could have multiple things wrong including tuberculoma's, toxoplasma infection primary CNS lymphoma --- Would continue current therapy and repeat MRI of the brain Monday or Tuesday with MRI of the orbits  #2 optic neuropathy: We will get MRI of the orbits as per ophthalmology's recommendation  #3 retinitis: Also a broad differential but  would place syphilis and CMV is the most likely culprits  #4 AIDS: Certainly now is NOT opportune time to start ARV given risk of IRIS to CNS and eyes    He is now admitted with  LOS: 5 days   Rhina Brackett Dam 08/03/2019, 11:02 AM

## 2019-08-04 DIAGNOSIS — B370 Candidal stomatitis: Secondary | ICD-10-CM

## 2019-08-04 DIAGNOSIS — B3781 Candidal esophagitis: Secondary | ICD-10-CM

## 2019-08-04 DIAGNOSIS — G009 Bacterial meningitis, unspecified: Secondary | ICD-10-CM | POA: Insufficient documentation

## 2019-08-04 LAB — MAGNESIUM: Magnesium: 1.7 mg/dL (ref 1.7–2.4)

## 2019-08-04 LAB — CBC
HCT: 36.9 % — ABNORMAL LOW (ref 39.0–52.0)
Hemoglobin: 12.7 g/dL — ABNORMAL LOW (ref 13.0–17.0)
MCH: 29.9 pg (ref 26.0–34.0)
MCHC: 34.4 g/dL (ref 30.0–36.0)
MCV: 86.8 fL (ref 80.0–100.0)
Platelets: 265 10*3/uL (ref 150–400)
RBC: 4.25 MIL/uL (ref 4.22–5.81)
RDW: 13.5 % (ref 11.5–15.5)
WBC: 3.8 10*3/uL — ABNORMAL LOW (ref 4.0–10.5)
nRBC: 0 % (ref 0.0–0.2)

## 2019-08-04 LAB — COMPREHENSIVE METABOLIC PANEL
ALT: 14 U/L (ref 0–44)
AST: 21 U/L (ref 15–41)
Albumin: 2.7 g/dL — ABNORMAL LOW (ref 3.5–5.0)
Alkaline Phosphatase: 46 U/L (ref 38–126)
Anion gap: 5 (ref 5–15)
BUN: 9 mg/dL (ref 6–20)
CO2: 23 mmol/L (ref 22–32)
Calcium: 8.9 mg/dL (ref 8.9–10.3)
Chloride: 100 mmol/L (ref 98–111)
Creatinine, Ser: 0.72 mg/dL (ref 0.61–1.24)
GFR calc Af Amer: >60 mL/min (ref >60)
GFR calc non Af Amer: >60 mL/min (ref >60)
Glucose, Bld: 123 mg/dL — ABNORMAL HIGH (ref 70–99)
Potassium: 4.6 mmol/L (ref 3.5–5.1)
Sodium: 128 mmol/L — ABNORMAL LOW (ref 135–145)
Total Bilirubin: 0.6 mg/dL (ref 0.3–1.2)
Total Protein: 7.9 g/dL (ref 6.5–8.1)

## 2019-08-04 LAB — SODIUM
Sodium: 127 mmol/L — ABNORMAL LOW (ref 135–145)
Sodium: 127 mmol/L — ABNORMAL LOW (ref 135–145)
Sodium: 128 mmol/L — ABNORMAL LOW (ref 135–145)

## 2019-08-04 LAB — PHOSPHORUS: Phosphorus: 3.1 mg/dL (ref 2.5–4.6)

## 2019-08-04 MED ORDER — MAGIC MOUTHWASH W/LIDOCAINE
10.0000 mL | Freq: Three times a day (TID) | ORAL | Status: DC
  Administered 2019-08-05 – 2019-08-20 (×51): 10 mL via ORAL
  Administered 2019-08-20: 5 mL via ORAL
  Administered 2019-08-20 – 2019-08-29 (×30): 10 mL via ORAL
  Filled 2019-08-04 (×107): qty 10

## 2019-08-04 MED ORDER — BACLOFEN 10 MG PO TABS
10.0000 mg | ORAL_TABLET | Freq: Once | ORAL | Status: AC
  Administered 2019-08-04: 02:00:00 10 mg via ORAL
  Filled 2019-08-04: qty 1

## 2019-08-04 MED ORDER — SODIUM CHLORIDE 0.9 % IV SOLN
12.5000 mg | Freq: Three times a day (TID) | INTRAVENOUS | Status: DC | PRN
  Administered 2019-08-06 – 2019-08-09 (×6): 12.5 mg via INTRAVENOUS
  Filled 2019-08-04 (×8): qty 0.5

## 2019-08-04 MED ORDER — MAGNESIUM SULFATE 2 GM/50ML IV SOLN
2.0000 g | Freq: Once | INTRAVENOUS | Status: AC
  Administered 2019-08-04: 14:00:00 2 g via INTRAVENOUS
  Filled 2019-08-04: qty 50

## 2019-08-04 MED ORDER — SODIUM CHLORIDE 0.9 % IV SOLN
INTRAVENOUS | Status: DC
  Administered 2019-08-04 – 2019-08-06 (×4): via INTRAVENOUS

## 2019-08-04 NOTE — Progress Notes (Signed)
Brief Nutrition Note RD working remotely.  Consult received for enteral/tube feeding initiation and management.  Spoke with RN. Patient does not have any enteral access at this time. Plan is to consult for small-bore feeding tube placement under fluoroscopy tomorrow by DG. Will hold off on entering adult tube feed protocol at this time as there is no current enteral access. Full RD assessment to follow.  Admitting Dx: Dizziness [R42] Acquired immunodeficiency syndrome ([place]) [B20]  Body mass index is 16.94 kg/m. Pt meets criteria for underweight based on current BMI.  Labs:  Recent Labs  Lab 07/29/19 0332 07/30/19 1113  08/02/19 0335 08/03/19 0258 08/04/19 0332 08/04/19 1049  NA 134* 130*   < > 134* 132* 128* 127*  K 4.4 4.2   < > 4.3 4.6 4.6  --   CL 104 99   < > 109 105 100  --   CO2 25 23   < > 20* 21* 23  --   BUN 16 9   < > 11 9 9   --   CREATININE 0.87 0.85   < > 0.78 0.81 0.72  --   CALCIUM 8.9 9.0   < > 9.1 9.3 8.9  --   MG 2.0 1.7  --   --   --  1.7  --   PHOS 3.1  --   --   --   --  3.1  --   GLUCOSE 96 103*   < > 111* 114* 123*  --    < > = values in this interval not displayed.    Jacklynn Barnacle, MS, RD, LDN Office: [phone] Pager: [phone] After Hours/Weekend Pager: [phone]

## 2019-08-04 NOTE — Progress Notes (Addendum)
PROGRESS NOTE  Arthur ParrMichael J [name] ZOX:[id number] DOB: 02/19/1981 DOA: 07/28/2019 PCP: Randall HissVan Dam, Cornelius N, MD  HPI/Recap of past 24 hours: Arthur BussingMichael J Teagueis a 38 y.o.malewith medical history significant of untreatedHIV, anemia, depression, GERD, substance abusepresented to ED on 07/28/2019 with a complaint of dizziness, weakness, intermittent headache. He has not taken any of his HIV medications for about 1 year due to affordability issue. MRI brain done in ED showed Widespread areas of abnormal low level restricted diffusion and postcontrast enhancement throughout both cerebral hemispheres, predominantly infratentorial, with the dominant abnormality in the LEFT anterior frontal white matter consistent with an opportunistic infection, such as toxoplasmosis, tuberculosis or fungal cerebritis/meningitis, or parasitic infection.  Patient was admitted to hospital service and ID was consulted. He then revealed to us that he has lost his vision in his right eye. ID started him on penicillin and ganciclovir for possible CMV retinitis versus synovitis retinitis and ophthalmology also saw him.  07/30/2019-status post lumbar puncture by neurology.  11/4 - 11/6 -remains afebrile, no changes right eyesight blindness,ID continues to follow very closely, no confirmatory infection organism, ophthalmology also following closely He remains encephalopathic,confused poor p.o. intake,mild hypotensive.  08/04/19: Patient was seen and examined at bedside this morning.  Lethargic.  One-to-one in place.  Worsening hyponatremia, will repeat serum sodium level and adjust IV fluid accordingly.  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  Currently on Received steroid injection 08/02/2019 Currently on ethambutol, isoniazid, rifampicin and pyridoxine, pyrazinamide for tuberculosis High-dose penicillin for neurosyphilis Ganciclovir for HSV encephalitis Bactrim for prophylaxis. Using as needed Haldol and bedside sitter for safety and fall risk. Currently not on antiretrovirals pending clinical improvement. Repeat MRI per ID recommendations possibly on 08/05/2019 or 08/06/2019  Hypovolemic hyponatremia TSH is within normal range Sodium 128 from 132 Currently on D5 normal saline at 125 cc/h Will DC D5 W Start normal saline at 75 cc/h Repeat BMP every 4 hours  Leukopenia/immunocompromised in the setting of untreated HIV WBC improving 3.8 from 3.2  Untreated HIV HIV treatment on hold Defer treatment to infectious disease  Hypomagnesemia Magnesium 1.7 Repleted with 2 g IV magnesium once  Acute metabolic encephalopathy: Due to #1.  As needed Ativan and Haldol.  Appreciate neurology input.  Dysphagia Seen by speech therapist Currently n.p.o. 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  Treating as above.  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  Seen by palliative care.  Currently recommending continuing aggressive measures.   DVT prophylaxis: Lovenox subcu daily Code Status: Full code Family Communication: None Disposition Plan:  Plan to DC when  infectious disease signs off and patient is hemodynamically stable.   Consultants:   Neurology  Infectious disease  Palliative care  Procedures:   Lumbar puncture  Antimicrobials:  ethambutol, isoniazid, rifampicin and pyridoxine, pyrazinamide for  tuberculosis High-dose penicillin for neurosyphilis Ganciclovir for HSV encephalitis Bactrim for prophylaxis    Objective: Vitals:   08/03/19 1950 08/04/19 0024 08/04/19 0500 08/04/19 0808  BP: 101/67 115/66 99/72 103/68  Pulse: (!) 56 69 (!) 56 (!) 57  Resp: 16  18   Temp: (!) 97.4 F (36.3 C) (!) 97.4 F (36.3 C) (!) 97.5 F (36.4 C)   TempSrc: Oral Oral Oral   SpO2: 100% 100% 98%   Weight:      Height:        Intake/Output Summary (Last 24 hours) at 08/04/2019 1038 Last data filed at 08/04/2019 0846 Gross per 24 hour  Intake 8806.41 ml  Output 700 ml  Net 8106.41 ml   Filed Weights   07/29/19 0753 07/30/19 0546 08/02/19 0615  Weight: 57 kg 57.6 kg 55.1 kg    Exam:  . General: 38 y.o. year-old male frail-appearing somnolent and minimally arousable.  Nonverbal.  Unclear baseline. . Cardiovascular: Regular rate and rhythm with no rubs or gallops.  No thyromegaly or JVD noted.   [name] Respiratory: Clear to auscultation with no wheezes or rales. Good inspiratory effort. . Abdomen: Soft nontender nondistended with normal bowel sounds x4 quadrants. . Musculoskeletal: No lower extremity edema. 2/4 pulses in all 4 extremities. [name] Psychiatry: Unable to assess mood due to altered mental status.  Data Reviewed: CBC: Recent Labs  Lab 07/28/19 2201  07/30/19 1113 08/01/19 0310 08/02/19 0335 08/03/19 0258 08/04/19 0332  WBC 1.9*   < > 2.0* 4.5 3.6* 3.2* 3.8*  NEUTROABS 0.8*  --  1.3*  --   --   --   --   HGB 14.2   < > 13.5 13.8 13.1 13.2 12.7*  HCT 42.8   < > 39.7 39.2 36.7* 37.8* 36.9*  MCV 90.7   < > 87.8 85.8 85.3 86.3 86.8  PLT 272   < > 255 257 270 278 265   < > = values in this interval not displayed.    Basic Metabolic Panel: Recent Labs  Lab 07/29/19 0332 07/30/19 1113 08/01/19 0310 08/02/19 0335 08/03/19 0258 08/04/19 0332  NA 134* 130* 129* 134* 132* 128*  K 4.4 4.2 4.7 4.3 4.6 4.6  CL 104 99 103 109 105 100  CO2 25 23 19* 20* 21* 23  GLUCOSE 96 103* 109* 111* 114* 123*  BUN 16 9 13 11 9 9   CREATININE 0.87 0.85 0.88 0.78 0.81 0.72  CALCIUM 8.9 9.0 9.0 9.1 9.3 8.9  MG 2.0 1.7  --   --   --  1.7  PHOS 3.1  --   --   --   --  3.1   GFR: Estimated Creatinine Clearance: 99.5 mL/min (by C-G formula based on SCr of 0.72 mg/dL). Liver Function Tests: Recent Labs  Lab 07/29/19 0332 08/01/19 0310 08/02/19 0335 08/03/19 0258 08/04/19 0332  AST 30 24 23 22 21   ALT 16 14 14 17 14   ALKPHOS 49 45 44 47 46  BILITOT 0.4 0.6 0.2* 0.6 0.6  PROT 8.3* 7.9 8.3* 8.4* 7.9  ALBUMIN 2.9* 2.6* 2.6* 2.9* 2.7*   No results for input(s): LIPASE, AMYLASE in the last 168 hours. No results for input(s): AMMONIA in the last 168 hours. Coagulation Profile: Recent Labs  Lab 07/30/19 0852  INR 1.1   Cardiac Enzymes: No results for input(s): CKTOTAL, CKMB, CKMBINDEX, TROPONINI in the last 168 hours. BNP (last 3 results) No results for input(s): PROBNP in the last 8760 hours. HbA1C: No  results for input(s): HGBA1C in the last 72 hours. CBG: No results for input(s): GLUCAP in the last 168 hours. Lipid Profile: No results for input(s): CHOL, HDL, LDLCALC, TRIG, CHOLHDL, LDLDIRECT in the last 72 hours. Thyroid Function Tests: No results for input(s): TSH, T4TOTAL, FREET4, T3FREE, THYROIDAB in the last 72 hours. Anemia Panel: No results for input(s): VITAMINB12, FOLATE, FERRITIN, TIBC, IRON, RETICCTPCT in the last 72 hours. Urine analysis:    Component Value Date/Time   COLORURINE AMBER (A) 07/26/2019 1317   APPEARANCEUR CLEAR 07/26/2019 1317   LABSPEC 1.029 07/26/2019 1317   PHURINE 6.0 07/26/2019 1317   GLUCOSEU NEGATIVE 07/26/2019 1317   HGBUR SMALL (A) 07/26/2019 1317   BILIRUBINUR  NEGATIVE 07/26/2019 1317   KETONESUR 20 (A) 07/26/2019 1317   PROTEINUR 30 (A) 07/26/2019 1317   UROBILINOGEN 1.0 03/06/2011 1601   NITRITE NEGATIVE 07/26/2019 1317   LEUKOCYTESUR NEGATIVE 07/26/2019 1317   Sepsis Labs: (procalcitonin:4,lacticidven:4)  ) Recent Results (from the past 240 hour(s))  SARS CORONAVIRUS 2 (TAT 6-24 HRS) Nasopharyngeal Nasopharyngeal Swab     Status: None   Collection Time: 07/28/19  8:24 PM   Specimen: Nasopharyngeal Swab  Result Value Ref Range Status   SARS Coronavirus 2 NEGATIVE NEGATIVE Final    Comment: (NOTE) SARS-CoV-2 target nucleic acids are NOT DETECTED. The SARS-CoV-2 RNA is generally detectable in upper and lower respiratory specimens during the acute phase of infection. Negative results do not preclude SARS-CoV-2 infection, do not rule out co-infections with other pathogens, and should not be used as the sole basis for treatment or other patient management decisions. Negative results must be combined with clinical observations, patient history, and epidemiological information. The expected result is Negative. Fact Sheet for Patients: HairSlick.no Fact Sheet for Healthcare Providers: quierodirigir.com This test is not yet approved or cleared by the [place] FDA and  has been authorized for detection and/or diagnosis of SARS-CoV-2 by FDA under an Emergency Use Authorization (EUA). This EUA will remain  in effect (meaning this test can be used) for the duration of the COVID-19 declaration under Section 56 4(b)(1) of the Act, 21 U.S.C. section 360bbb-3(b)(1), unless the authorization is terminated or revoked sooner. Performed at [facility], [address], [place], [place] [address]   CSF culture with Stat gram stain     Status: None   Collection Time: 07/30/19  1:46 PM   Specimen: CSF; Cerebrospinal Fluid  Result Value Ref Range Status   Specimen Description  CSF  Final   Special Requests NONE  Final   Gram Stain   Final    WBC PRESENT, PREDOMINANTLY MONONUCLEAR NO ORGANISMS SEEN CYTOSPIN SMEAR    Culture   Final    NO GROWTH 3 DAYS Performed at [facility], [address] [place], [place] [address]    Report Status 08/02/2019 FINAL  Final  Acid Fast Smear (AFB)     Status: None   Collection Time: 07/30/19  1:46 PM  Result Value Ref Range Status   AFB Specimen Processing Concentration  Final   Acid Fast Smear Negative  Final    Comment: (NOTE) Performed At: [facility] [address] [place], [place] [id number] Jolene Schimke MD [id number]    Source (AFB) CSF  Final    Comment: Performed at [facility], [address] [place], [place] [address]      Studies: Dg Chest 1 View  Result Date: 08/03/2019 CLINICAL DATA:  SOB EXAM: CHEST  1 VIEW  COMPARISON:  Chest radiograph 07/22/2019 FINDINGS: The heart size and mediastinal contours are within normal limits. The lungs are clear. No pneumothorax or pleural effusion. The visualized skeletal structures are unremarkable. IMPRESSION: No acute cardiopulmonary process. Electronically Signed   By: Audie Pinto M.D.   On: 08/03/2019 16:50    Scheduled Meds: . enoxaparin (LOVENOX) injection  40 mg Subcutaneous Q24H  . ethambutol  1,200 mg Oral Daily  . feeding supplement  1 Container Oral BID WC  . feeding supplement (ENSURE ENLIVE)  237 mL Oral BID BM  . isoniazid  300 mg Oral Daily  . magic mouthwash w/lidocaine  10 mL Oral TID AC & HS  . mouth rinse  15 mL Mouth Rinse BID  . multivitamin with minerals  1 tablet Oral Daily  . pyrazinamide  1,500 mg Oral Daily  . vitamin B-6  50 mg Oral Daily  . rifampin  600 mg Oral Daily  . sodium chloride flush  3 mL Intravenous Once  . sodium chloride flush  3 mL Intravenous Q12H  . sulfamethoxazole-trimethoprim  1 tablet Oral Daily    Continuous Infusions: . sodium chloride 10 mL/hr at 07/29/19 0742  .  dexamethasone (DECADRON) IVPB ([facility]) 25 mg (08/02/19 1300)  . dextrose 5 % and 0.9% NaCl 125 mL/hr at 08/04/19 1009  . fluconazole (DIFLUCAN) IV    . ganciclovir Stopped (08/03/19 2341)  . magnesium sulfate bolus IVPB    . penicillin g continuous IV infusion 12 Million Units (08/04/19 1010)     LOS: 6 days     Kayleen Memos, MD [facility] Pager [phone]  If 7PM-7AM, please contact night-coverage [email or web address] Password [facility] 08/04/2019, 10:38 AM

## 2019-08-04 NOTE — Progress Notes (Signed)
Daily Progress Note   Patient Name: Arthur [name]       Date: 08/04/2019 DOB: 01-29-1981  Age: 38 y.o. MRN#: [id number] Attending Physician: Kayleen Memos, DO Primary Care Physician: Tommy Medal, Lavell Islam, MD Admit Date: 07/28/2019  Reason for Consultation/Follow-up: Establishing goals of care, Non pain symptom management and Psychosocial/spiritual support  Subjective: Visited patient at bedside.  Sitter present.  Patient more awake.  Will speak but it is very painful.  Eyes are wide and gaze is asymmetrical. Per RN Marya Amsler, patient was unable to work with speech therapy this am due to pain.   He also indicates chest pain.  He is having frequent hiccups. Family has not arrived yet today.    Per Marya Amsler patient required morphine and phenergan overnight due to discomfort.  Assessment: Patient more interactive with me today. Appears in mild distress   Patient Profile/HPI:  38 y.o. male with past medical history of HIV, polysubstance abuse, anxiety, depression, and anemia who was admitted on 07/28/2019 with dizziness and weakness.    On admission he reported that he had been unable to afford his HIV medications for the past year.  He was admitted for evaluation and treatment of opportunistic infection.  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    Length of Stay: 6  Current Medications: Scheduled Meds:  . enoxaparin (LOVENOX) injection  40 mg Subcutaneous Q24H  . ethambutol  1,200 mg Oral Daily  . feeding supplement  1 Container Oral BID WC  . feeding supplement (ENSURE ENLIVE)  237 mL Oral BID BM  . isoniazid  300 mg Oral Daily  . magic mouthwash w/lidocaine  10 mL Oral TID AC & HS  . mouth  rinse  15 mL Mouth Rinse BID  . multivitamin with minerals  1 tablet Oral Daily  . pyrazinamide  1,500 mg Oral Daily  . vitamin B-6  50 mg Oral Daily  . rifampin  600 mg Oral Daily  . sodium chloride flush  3 mL Intravenous Once  . sodium chloride flush  3 mL Intravenous Q12H  . sulfamethoxazole-trimethoprim  1 tablet Oral Daily    Continuous Infusions: . sodium chloride 10 mL/hr at 07/29/19 0742  . sodium chloride    .  chlorproMAZINE (THORAZINE) IV    . dexamethasone (DECADRON) IVPB ([facility]) 25 mg (08/02/19 1300)  . fluconazole (DIFLUCAN) IV    . ganciclovir 285 mg (08/04/19 1053)  . magnesium sulfate bolus IVPB    . penicillin g continuous IV infusion 12 Million Units (08/04/19 1010)    PRN Meds: sodium chloride, acetaminophen **OR** acetaminophen, chlorproMAZINE (THORAZINE) IV, haloperidol lactate, morphine injection, ondansetron **OR** ondansetron (ZOFRAN) IV, phenol, sodium chloride flush  Physical Exam      Thin well developed young male, awake appears in mild distress due to pain, +Hiccups Gaze is disconjugate due to right eye. CV brady and regular Resp no distress on 4L  No w/c/r Abdomen soft, thin, nt, nd  Vital Signs: BP 103/68   Pulse (!) 57   Temp (!) 97.5 F (36.4 C) (Oral)   Resp 18   Ht 5\' 11"  (1.803 m)   Wt 55.1 kg   SpO2 98%   BMI 16.94 kg/m  SpO2: SpO2: 98 % O2 Device: O2 Device: Nasal Cannula O2 Flow Rate: O2 Flow Rate (L/min): 4 L/min  Intake/output summary:   Intake/Output Summary (Last 24 hours) at 08/04/2019 1216 Last data filed at 08/04/2019 0846 Gross per 24 hour  Intake 8806.41 ml  Output 700 ml  Net 8106.41 ml   LBM: Last BM Date: 07/28/19 Baseline Weight: Weight: 57 kg Most recent weight: Weight: 55.1 kg       Palliative Assessment/Data:  40%      Patient Active Problem List   Diagnosis Date Noted  . Bacterial meningitis   . Palliative care encounter   . HIV (human immunodeficiency virus infection) (HCC) 07/29/2019  .  Vision loss 07/29/2019  . Abnormal brain MRI 07/29/2019  . Acquired immunodeficiency syndrome (HCC) 07/28/2019  . Bradycardia 07/28/2019  . Hyponatremia 07/28/2019  . Nausea and vomiting 03/25/2013  . Muscle spasm 03/25/2013  . Drug use 01/11/2012  . 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 COUGH 06/23/2009  . CHEST PAIN, PLEURITIC 06/23/2009  . FUNGAL DERMATITIS 05/28/2008  . GERD 05/28/2008  . DENTAL CARIES 02/13/2008  . NICOTINE ADDICTION 11/14/2007  . BACK STRAIN, LUMBAR 08/22/2007  . 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 WEIGHT LOSS, RECENT 05/01/2007  . ANXIETY DEPRESSION 02/14/2007  . HIV DISEASE 02/13/2007  . CERVICAL LYMPHADENOPATHY, ANTERIOR, RIGHT 02/13/2007    Palliative Care Plan    Recommendations/Plan:  Thorazine added PRN for hiccups.  Started on fluconazole and nystatin (magic mouthwash) for thrush  PMT will continue to follow along intermittently with you.  After 02/15/2007 improves a bit I would like to understand why he was not taking his medications.  My understanding is that cost is not an issue with HIV medications (but I may not be current on this).  This will be important in his goals of care.  Goals of Care and Additional Recommendations:  Limitations on Scope of Treatment: Full Scope Treatment  Code Status:  Full code  Prognosis:   Unable to determine   Discharge Planning:  To Be Determined.  Anticipate SNF with Palliative.  Care plan was discussed with RN, [name].  Thank you for allowing the Palliative Medicine Team to assist in the care of this patient.  Total time spent:  25 min.     Greater than 50%  of this time was spent counseling and coordinating care related to the above assessment and plan.  Tammy Sours, PA-C Palliative Medicine  Please contact Palliative MedicineTeam phone at [phone] for questions and concerns between 7 am - 7 pm.  Please see AMION for individual provider pager numbers.

## 2019-08-04 NOTE — Evaluation (Signed)
Clinical/Bedside Swallow Evaluation Patient Details  Name: Arthur [name] MRN: [id number] Date of Birth: 07-08-1981  Today's Date: 08/04/2019 Time: SLP Start Time (ACUTE ONLY): 1937 SLP Stop Time (ACUTE ONLY): 1002 SLP Time Calculation (min) (ACUTE ONLY): 25 min  Past Medical History:  Past Medical History:  Diagnosis Date  . Anemia   . Anxiety   . Depression   . GERD (gastroesophageal reflux disease)   . HIV infection (HCC)   . Substance abuse [facility])    Past Surgical History:  Past Surgical History:  Procedure Laterality Date  . TOOTH EXTRACTION     HPI:  Arthur [name] is a 38 y.o. male with medical history significant of untreated HIV, anemia, depression, GERD, substance abuse.  He presented with worsening dizziness/lightheadedness, intermittent headaches but no nausea/vomiting or diarrhea but some decreased p.o. intake.  He had no fevers, chest pain or shortness of breath.  Patient states that he has not been able to afford his HIV medications and has not been taking them in a year.  Set up a follow-up appointment with his ID doctor.  In the past week he has been seen in the emergency department 3 times for various complaints.  Most recent chest x-ray was unremarkable.  MRI of the brain was showing Widespread areas of abnormal low level restricted diffusion and postcontrast enhancement throughout both cerebral hemispheres,  predominantly infratentorial, with the dominant abnormality in the LEFT anterior frontal white matter. In this immunocompromised patient, the findings are most consistent with an opportunistic infection, such as toxoplasmosis, tuberculosis or fungal cerebritis/meningitis, or parasitic infection. Infectious disease consultation is warranted.  Generalized atrophy with mild small vessel disease elsewhere,  premature for age but not clearly acute.  No midline shift or acute hydrocephalus.   Assessment / Plan / Recommendation Clinical Impression  Clinical swallowing  evaluation was completed using ice chips, thin liquids via spoon and cup and nectar thick liquids via spoon and cup.  Cranial nerve exam was attempted and unable to be completed as he was unable to follow commands.  He required frequent stimulation to maintain alertness level.  RN reported that the patient has been having issues with decreased intake.  He presented with a probable cognitive based dysphagia.  Oral holding was seen across bolus types with no pharyngeal swallow trigger seen given ice chip boluses despite cues.  Swallow triggers were appreciated to palpation given thin and nectar thick liquids but cues to swallow were required.  Delayed cough was seen post swallow given both thin and nectar thick liquids.  Given current clinical presentation recommend that the patient be held NPO including medications pending re assessment next date.  These findings were communicated to the MD, Dr Margo Aye who plans to insert a cortrak.   SLP Visit Diagnosis: Dysphagia, unspecified (R13.10)    Aspiration Risk  Moderate aspiration risk    Diet Recommendation   NPO including medications pending re assessment  Medication Administration: Via alternative means    Other  Recommendations Oral Care Recommendations: Oral care QID   Follow up Recommendations Other (comment)(TBD)      Frequency and Duration min 2x/week  2 weeks       Prognosis Prognosis for Safe Diet Advancement: Good Barriers to Reach Goals: Cognitive deficits      Swallow Study   General Date of Onset: 07/28/19 HPI: GIOVONNIE [name] is a 38 y.o. male with medical history significant of untreated HIV, anemia, depression, GERD, substance abuse.  He presented with worsening dizziness/lightheadedness, intermittent  headaches but no nausea/vomiting or diarrhea but some decreased p.o. intake.  He had no fevers, chest pain or shortness of breath.  Patient states that he has not been able to afford his HIV medications and has not been taking them  in a year.  Set up a follow-up appointment with his ID doctor.  In the past week he has been seen in the emergency department 3 times for various complaints.  Most recent chest x-ray was unremarkable.  MRI of the brain was showing Widespread areas of abnormal low level restricted diffusion and postcontrast enhancement throughout both cerebral hemispheres,  predominantly infratentorial, with the dominant abnormality in the LEFT anterior frontal white matter. In this immunocompromised patient, the findings are most consistent with an opportunistic infection, such as toxoplasmosis, tuberculosis or fungal cerebritis/meningitis, or parasitic infection. Infectious disease consultation is warranted.  Generalized atrophy with mild small vessel disease elsewhere,  premature for age but not clearly acute.  No midline shift or acute hydrocephalus. Type of Study: Bedside Swallow Evaluation Previous Swallow Assessment: None noted at [facility].   Diet Prior to this Study: Regular;Nectar-thick liquids Temperature Spikes Noted: No Respiratory Status: Nasal cannula History of Recent Intubation: No Behavior/Cognition: Confused;Doesn't follow directions Oral Cavity Assessment: Within Functional Limits Oral Care Completed by SLP: No Self-Feeding Abilities: Total assist Patient Positioning: Upright in bed Baseline Vocal Quality: Low vocal intensity Volitional Cough: Cognitively unable to elicit Volitional Swallow: Unable to elicit    Oral/Motor/Sensory Function Overall Oral Motor/Sensory Function: Other (comment)(Pt unable to complete due to inability to follow commands)   Ice Chips Ice chips: Impaired Presentation: Spoon Oral Phase Impairments: Poor awareness of bolus;Impaired mastication Oral Phase Functional Implications: Prolonged oral transit;Oral holding Pharyngeal Phase Impairments: (No pharyngeal swallow triggered)   Thin Liquid Thin Liquid: Impaired Presentation: Spoon;Cup Oral Phase Impairments: Poor  awareness of bolus;Impaired mastication Oral Phase Functional Implications: Prolonged oral transit;Oral residue;Oral holding Pharyngeal  Phase Impairments: Suspected delayed Swallow;Cough - Delayed    Nectar Thick Nectar Thick Liquid: Impaired Presentation: Spoon Oral Phase Impairments: Poor awareness of bolus;Impaired mastication Oral phase functional implications: Oral residue;Oral holding;Prolonged oral transit Pharyngeal Phase Impairments: Suspected delayed Swallow;Cough - Delayed   Honey Thick Honey Thick Liquid: Not tested   Puree Puree: Not tested   Solid     Solid: Not tested     Shelly Flatten, MA, CCC-SLP Acute Rehab SLP [phone]  Lamar Sprinkles 08/04/2019,10:23 AM

## 2019-08-05 ENCOUNTER — Inpatient Hospital Stay (HOSPITAL_COMMUNITY): Payer: Medicaid Other

## 2019-08-05 LAB — COMPREHENSIVE METABOLIC PANEL
ALT: 16 U/L (ref 0–44)
AST: 26 U/L (ref 15–41)
Albumin: 3 g/dL — ABNORMAL LOW (ref 3.5–5.0)
Alkaline Phosphatase: 53 U/L (ref 38–126)
Anion gap: 7 (ref 5–15)
BUN: 8 mg/dL (ref 6–20)
CO2: 22 mmol/L (ref 22–32)
Calcium: 9.2 mg/dL (ref 8.9–10.3)
Chloride: 98 mmol/L (ref 98–111)
Creatinine, Ser: 0.7 mg/dL (ref 0.61–1.24)
GFR calc Af Amer: >60 mL/min (ref >60)
GFR calc non Af Amer: >60 mL/min (ref >60)
Glucose, Bld: 92 mg/dL (ref 70–99)
Potassium: 4.3 mmol/L (ref 3.5–5.1)
Sodium: 127 mmol/L — ABNORMAL LOW (ref 135–145)
Total Bilirubin: 0.5 mg/dL (ref 0.3–1.2)
Total Protein: 8.8 g/dL — ABNORMAL HIGH (ref 6.5–8.1)

## 2019-08-05 LAB — CBC
HCT: 40.5 % (ref 39.0–52.0)
Hemoglobin: 14.3 g/dL (ref 13.0–17.0)
MCH: 30 pg (ref 26.0–34.0)
MCHC: 35.3 g/dL (ref 30.0–36.0)
MCV: 85.1 fL (ref 80.0–100.0)
Platelets: 283 10*3/uL (ref 150–400)
RBC: 4.76 MIL/uL (ref 4.22–5.81)
RDW: 13.2 % (ref 11.5–15.5)
WBC: 2.7 10*3/uL — ABNORMAL LOW (ref 4.0–10.5)
nRBC: 0 % (ref 0.0–0.2)

## 2019-08-05 LAB — SODIUM
Sodium: 126 mmol/L — ABNORMAL LOW (ref 135–145)
Sodium: 128 mmol/L — ABNORMAL LOW (ref 135–145)
Sodium: 130 mmol/L — ABNORMAL LOW (ref 135–145)

## 2019-08-05 LAB — MISC LABCORP TEST (SEND OUT)

## 2019-08-05 LAB — SODIUM, URINE, RANDOM: Sodium, Ur: 154 mmol/L

## 2019-08-05 LAB — OSMOLALITY: Osmolality: 267 mOsm/kg — ABNORMAL LOW (ref 275–295)

## 2019-08-05 LAB — OSMOLALITY, URINE: Osmolality, Ur: 558 mOsm/kg (ref 300–900)

## 2019-08-05 IMAGING — MR MR HEAD WO/W CM
17 of 21 series · 36 of 48 positions shown · IV contrast (gadavist)
Comparison: Brain MRI [DATE]

CLINICAL DATA: Optic neuritis. Possible intracranial infection.
Compromised immune status.

EXAM:
MRI HEAD AND ORBITS WITHOUT AND WITH CONTRAST
TECHNIQUE: Multiplanar, multiecho pulse sequences of the brain and surrounding
structures were obtained without and with intravenous contrast.
Multiplanar, multiecho pulse sequences of the orbits and surrounding
structures were obtained including fat saturation techniques, before
and after intravenous contrast administration.
CONTRAST:  5mL GADAVIST GADOBUTROL 1 MMOL/ML IV SOLN

[Series 5: DWI · axial · 3.0mm · 0.88mm/px · z∈[-125,+27]mm · 7 of 104 slices shown (1 of 2)]
[im 1/104]
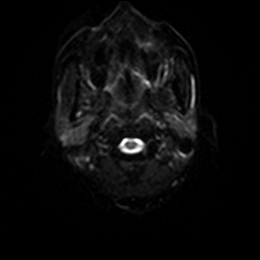
[im 18/104]
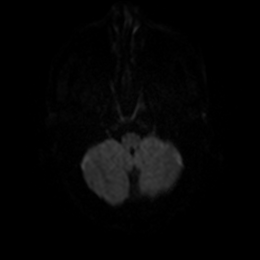
[im 35/104]
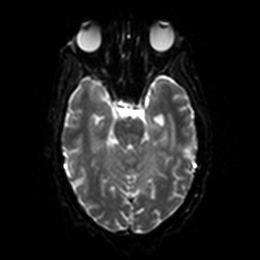
[im 52/104]
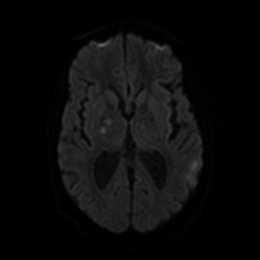
[im 69/104]
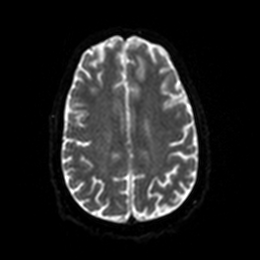
[im 86/104]
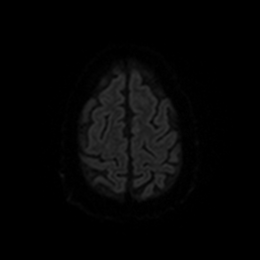
[im 104/104]
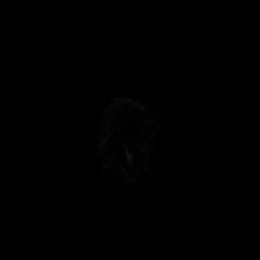

[Series 6: DWI · axial · 3.0mm · 0.88mm/px · z∈[-125,+27]mm · 4 of 51 slices shown (2 of 2)]
[im 1/51]
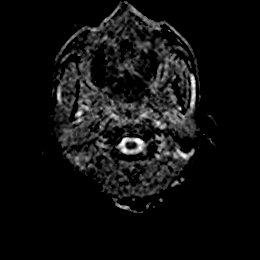
[im 17/51]
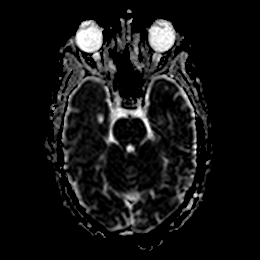
[im 34/51]
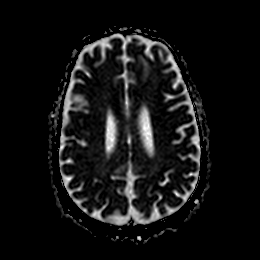
[im 51/51]
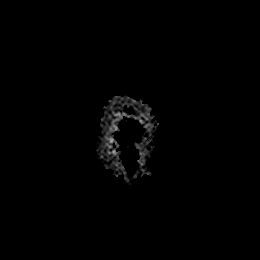

[Series 7: T1 · sagittal · 5.0mm · 0.78mm/px · 1 of 23 slices shown (1 of 4)]
[im 1/23]
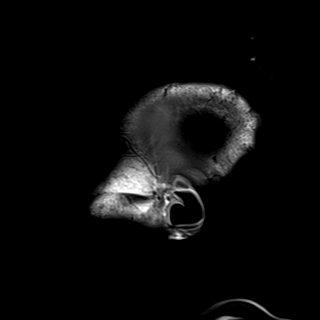

[Series 8: T2 · axial · 5.0mm · 0.72mm/px · 1 of 25 slices shown]
[im 1/25]
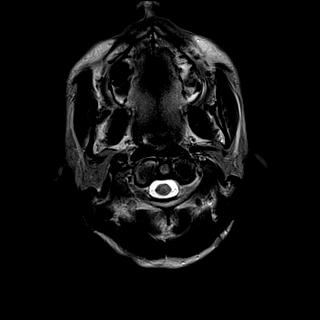

[Series 22: T1 · sagittal · 5.0mm · 0.75mm/px · 1 of 20 slices shown (2 of 4)]
[im 1/20]
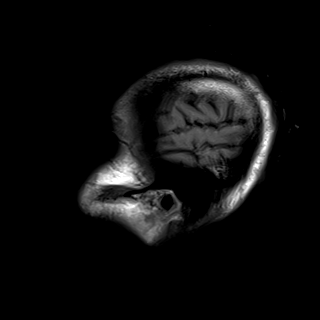

[Series 23: T1 · axial · non-contrast · 3.0mm · 0.37mm/px · 1 of 20 slices shown (3 of 4)]
[im 1/20]
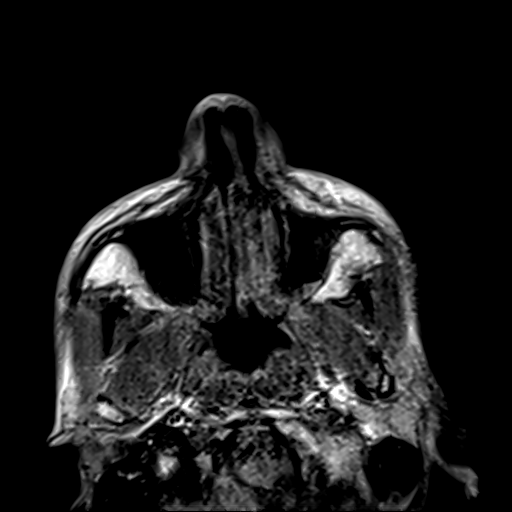

[Series 25: T2 fat-sat · axial · 3.0mm · 0.54mm/px · 1 of 20 slices shown (1 of 3)]
[im 1/20]
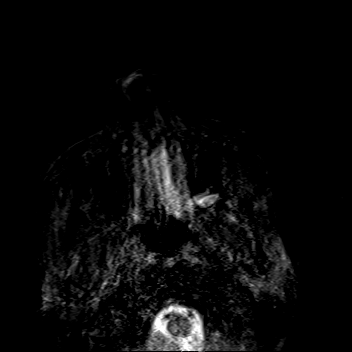

[Series 27: T2 fat-sat · coronal · 3.0mm · 0.54mm/px · 2 of 30 slices shown (2 of 3)]
[im 1/30]
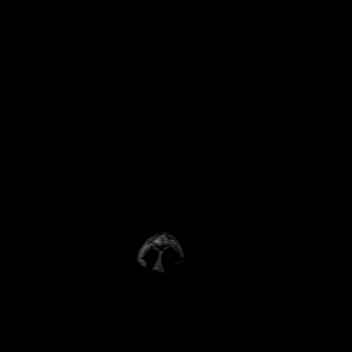
[im 30/30]
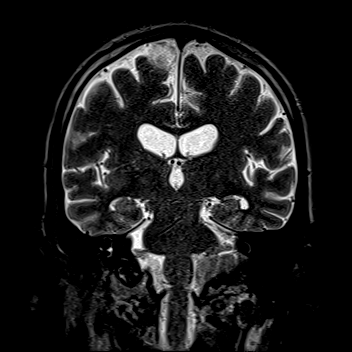

[Series 29: T2 fat-sat · coronal · 3.0mm · 0.54mm/px · 2 of 30 slices shown (3 of 3)]
[im 1/30]
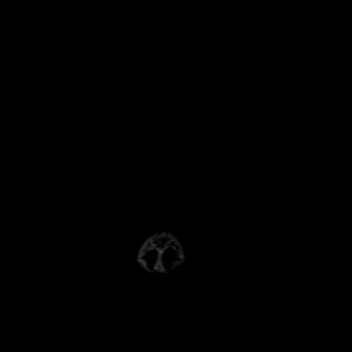
[im 30/30]
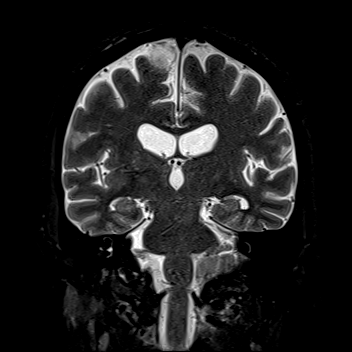

[Series 30: T1 · coronal · 3.0mm · 0.37mm/px · 2 of 30 slices shown (4 of 4)]
[im 1/30]
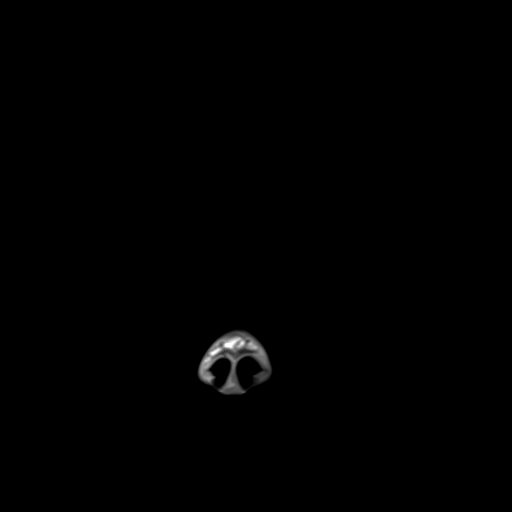
[im 30/30]
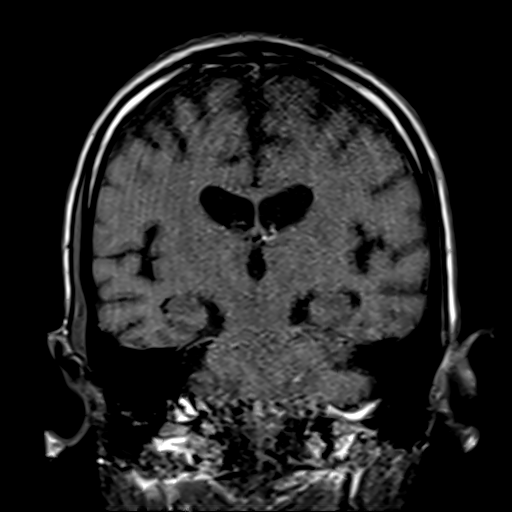

[Series 31: FLAIR · axial · 5.0mm · 0.45mm/px · 1 of 25 slices shown]
[im 1/25]
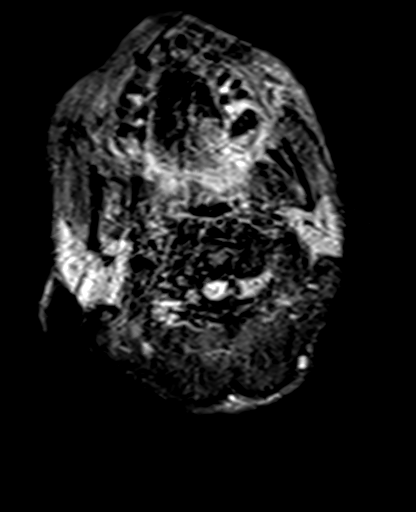

[Series 32: mag_images · axial · 3.0mm · 0.90mm/px · z∈[-155,+13]mm · 3 of 60 slices shown]
[im 1/60]
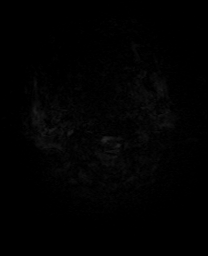
[im 30/60]
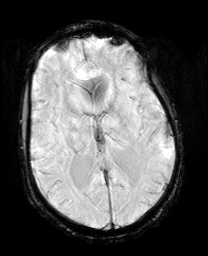
[im 60/60]
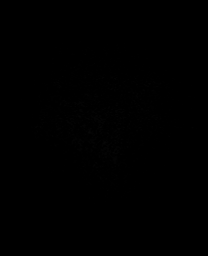

[Series 33: pha_images · axial · 3.0mm · 0.90mm/px · z∈[-152,+1]mm · 3 of 55 slices shown]
[im 1/55]
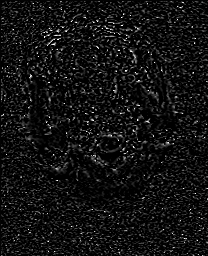
[im 28/55]
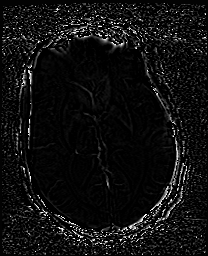
[im 55/55]
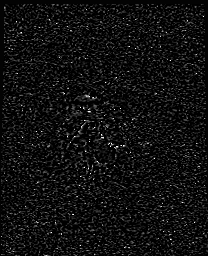

[Series 37: T2 post-contrast · coronal · 5.0mm · 0.72mm/px · 2 of 28 slices shown]
[im 1/28]
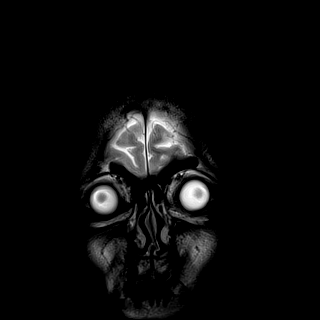
[im 28/28]
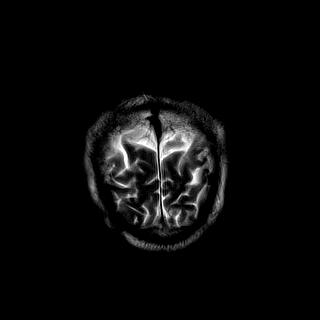

[Series 38: T1 fat-sat post-contrast · axial · 3.0mm · 0.37mm/px · 1 of 20 slices shown (1 of 2)]
[im 1/20]
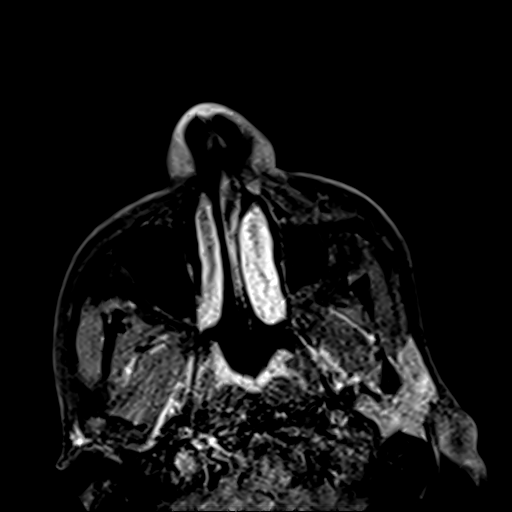

[Series 40: T1 post-contrast · coronal · 5.0mm · 0.34mm/px · 2 of 28 slices shown]
[im 1/28]
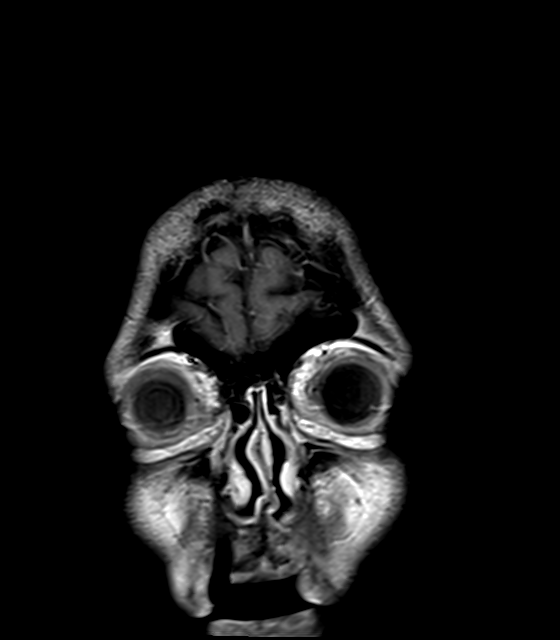
[im 28/28]
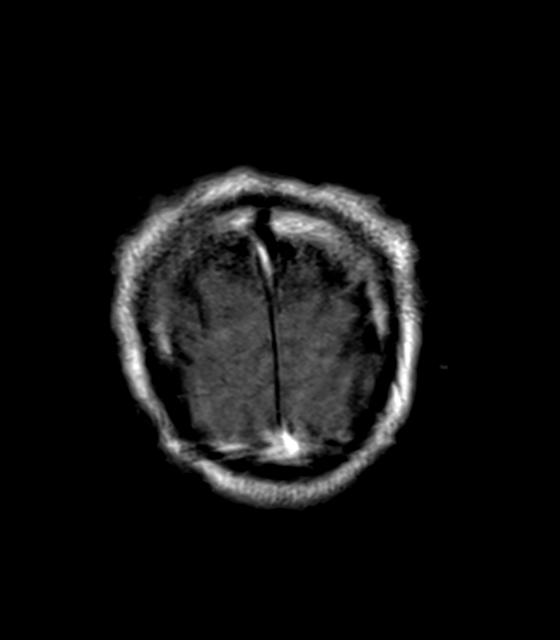

[Series 41: T1 fat-sat post-contrast · coronal · 3.0mm · 0.37mm/px · 2 of 30 slices shown (2 of 2)]
[im 1/30]
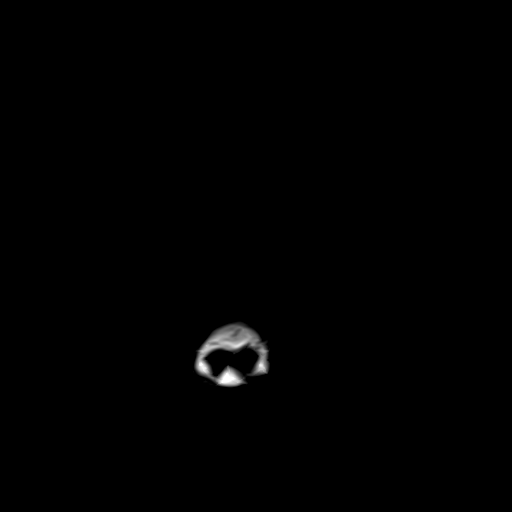
[im 30/30]
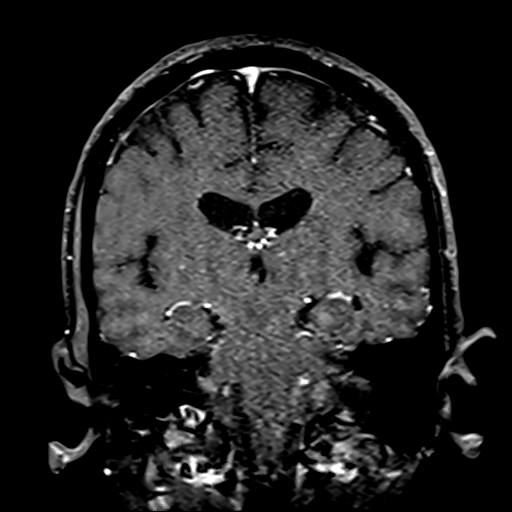

[36 of 48 positions shown; findings below may reference images not displayed]

FINDINGS: MRI HEAD FINDINGS

Brain: Improved appearance of the brain on diffusion-weighted
imaging compared to [DATE]. There are still areas of residual
diffusion abnormality scattered throughout the supratentorial brain
and brainstem, but these are decreased from the prior study.
Cerebellar abnormalities have substantially resolved. Areas of
hyperintense T2-weighted signal on FLAIR imaging are unchanged. The
areas of abnormal contrast enhancement have also decreased. There is
some persistent abnormal leptomeningeal enhancement in the
cerebellum and both posterior parietal lobes.

Vascular: Normal flow voids

Skull and upper cervical spine: Normal bone marrow signal

Other: None

MRI ORBITS FINDINGS

Orbits: There is abnormal contrast enhancement along the cisternal
segments of optic nerves (series 41 images 9 and 10). Extraocular
muscles are symmetric and normal. Flow voids within the superior
ophthalmic veins are normal. Lacrimal glands and fossa are normal.
The globes appear normal.

Visualized sinuses: Clear

Soft tissues: Normal
IMPRESSION: 1. Decreased areas of diffusion abnormality, hyperintense
T2-weighted signal and abnormal contrast enhancement within the
cerebellum and both cerebral hemispheres, consistent with response
to antibiotic therapy.
2. Abnormal contrast enhancement of the cisternal segments of both
optic nerves is consistent with infectious optic neuropathy.

## 2019-08-05 IMAGING — MR MR ORBITS WO/W CM
17 of 22 series · 33 of 48 positions shown · IV contrast (gadavist)
Comparison: Brain MRI [DATE]

CLINICAL DATA: Optic neuritis. Possible intracranial infection.
Compromised immune status.

EXAM:
MRI HEAD AND ORBITS WITHOUT AND WITH CONTRAST
TECHNIQUE: Multiplanar, multiecho pulse sequences of the brain and surrounding
structures were obtained without and with intravenous contrast.
Multiplanar, multiecho pulse sequences of the orbits and surrounding
structures were obtained including fat saturation techniques, before
and after intravenous contrast administration.
CONTRAST:  5mL GADAVIST GADOBUTROL 1 MMOL/ML IV SOLN

[Series 5: DWI · axial · 3.0mm · 0.88mm/px · z∈[-125,+27]mm · 7 of 104 slices shown (1 of 2)]
[im 1/104]
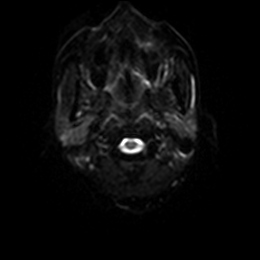
[im 18/104]
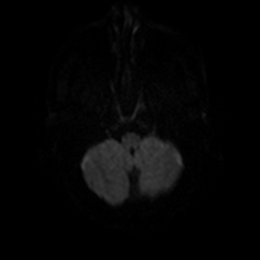
[im 35/104]
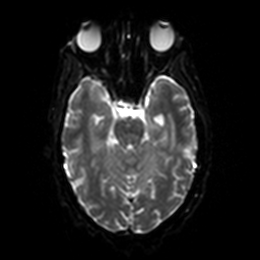
[im 52/104]
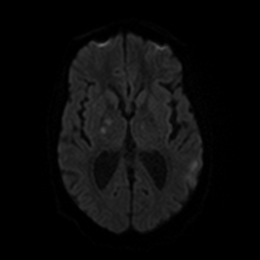
[im 69/104]
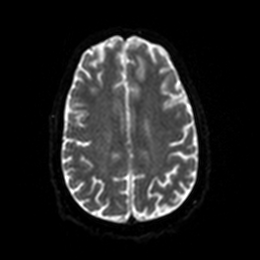
[im 86/104]
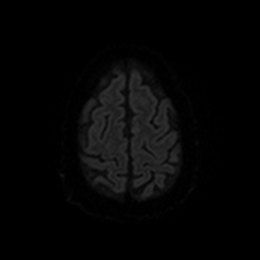
[im 104/104]
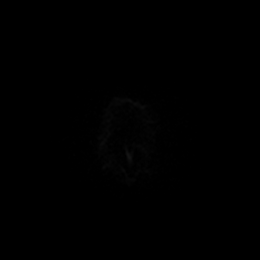

[Series 6: DWI · axial · 3.0mm · 0.88mm/px · z∈[-125,+27]mm · 3 of 51 slices shown (2 of 2)]
[im 1/51]
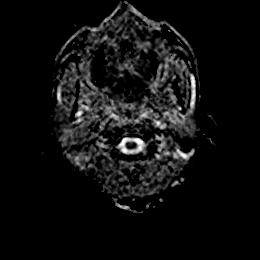
[im 26/51]
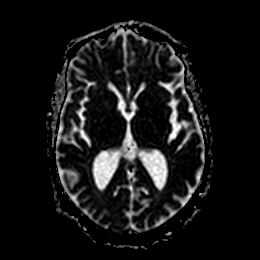
[im 51/51]
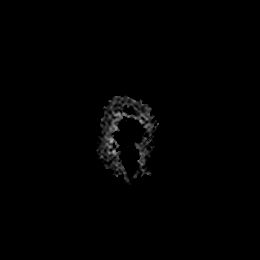

[Series 7: T1 · sagittal · 5.0mm · 0.78mm/px · 1 of 23 slices shown (1 of 5)]
[im 1/23]
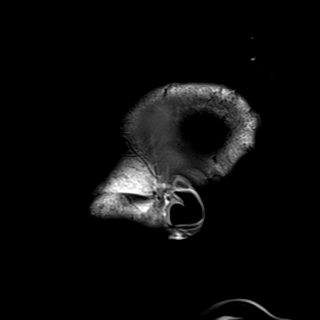

[Series 8: T2 · axial · 5.0mm · 0.72mm/px · 1 of 25 slices shown]
[im 1/25]
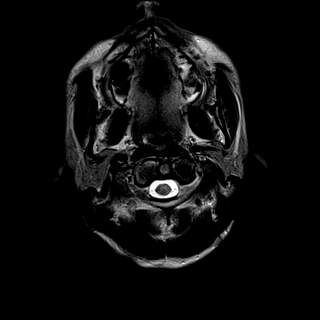

[Series 9: T1 · axial · non-contrast · 3.0mm · 0.37mm/px · 1 of 18 slices shown (2 of 5)]
[im 1/18]
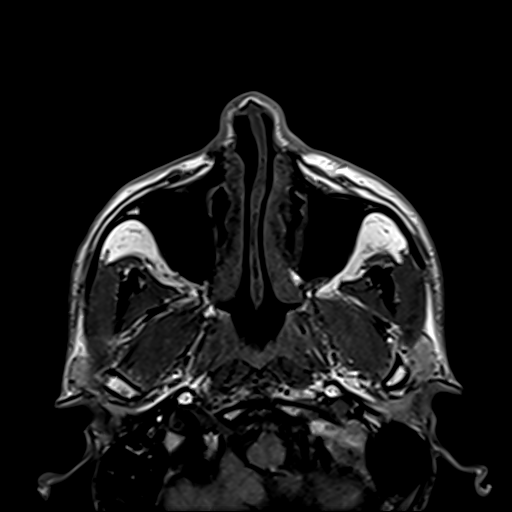

[Series 22: T1 · sagittal · 5.0mm · 0.75mm/px · 1 of 20 slices shown (3 of 5)]
[im 1/20]
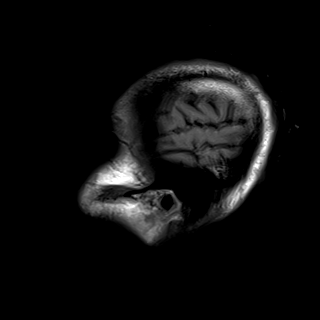

[Series 23: T1 · axial · non-contrast · 3.0mm · 0.37mm/px · 1 of 20 slices shown (4 of 5)]
[im 1/20]
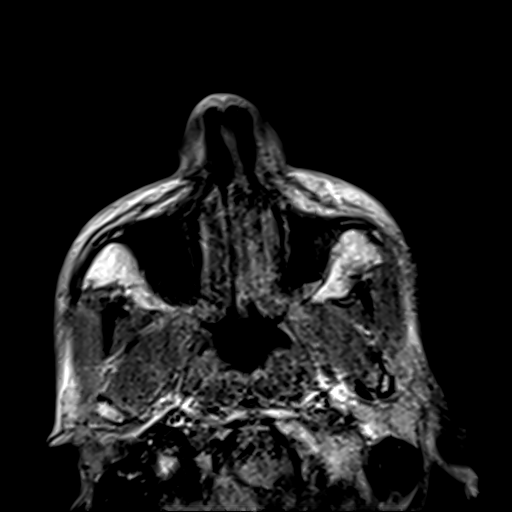

[Series 25: T2 fat-sat · axial · 3.0mm · 0.54mm/px · 1 of 20 slices shown (1 of 3)]
[im 1/20]
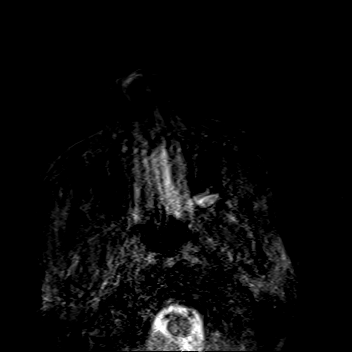

[Series 27: T2 fat-sat · coronal · 3.0mm · 0.54mm/px · 2 of 30 slices shown (2 of 3)]
[im 1/30]
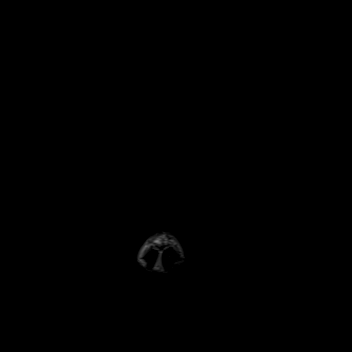
[im 30/30]
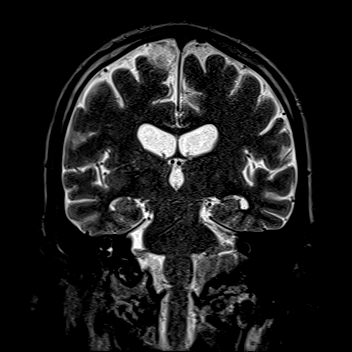

[Series 29: T2 fat-sat · coronal · 3.0mm · 0.54mm/px · 2 of 30 slices shown (3 of 3)]
[im 1/30]
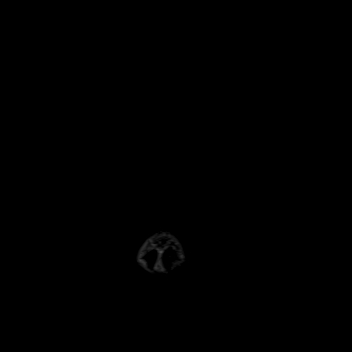
[im 30/30]
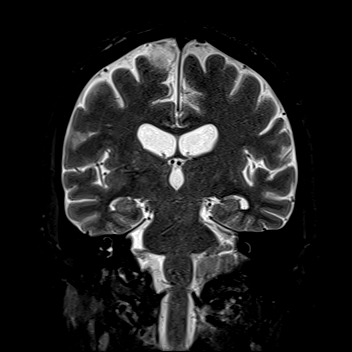

[Series 30: T1 · coronal · 3.0mm · 0.37mm/px · 2 of 30 slices shown (5 of 5)]
[im 1/30]
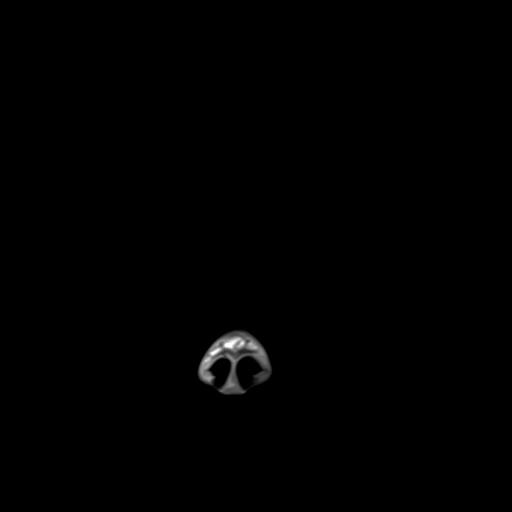
[im 30/30]
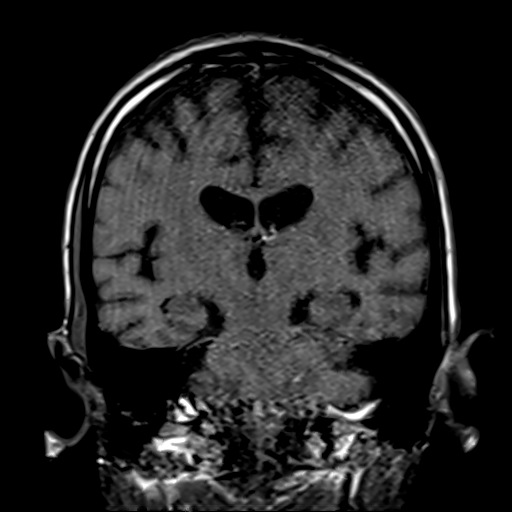

[Series 31: FLAIR · axial · 5.0mm · 0.45mm/px · 1 of 25 slices shown]
[im 1/25]
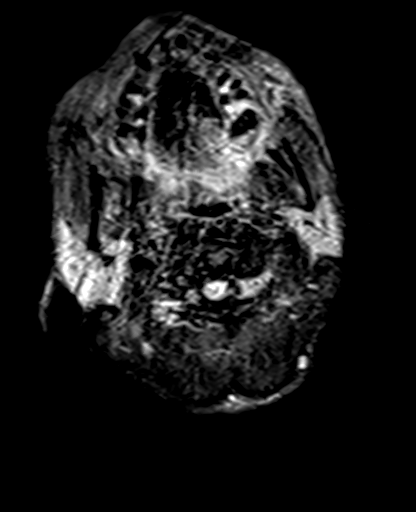

[Series 32: mag_images · axial · 3.0mm · 0.90mm/px · z∈[-155,+13]mm · 3 of 60 slices shown]
[im 1/60]
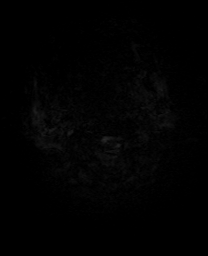
[im 30/60]
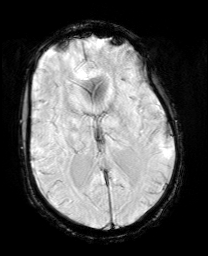
[im 60/60]
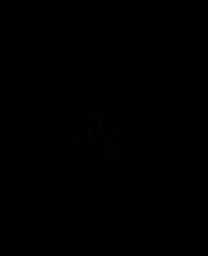

[Series 37: T2 post-contrast · coronal · 5.0mm · 0.72mm/px · 2 of 28 slices shown]
[im 1/28]
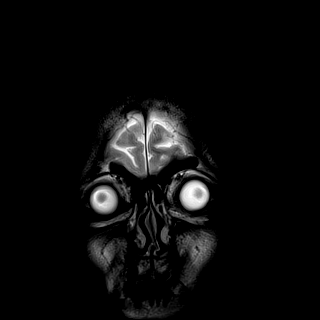
[im 28/28]
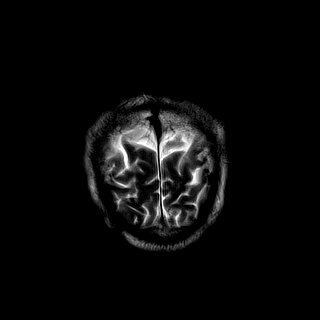

[Series 38: T1 fat-sat post-contrast · axial · 3.0mm · 0.37mm/px · 1 of 20 slices shown (1 of 2)]
[im 1/20]
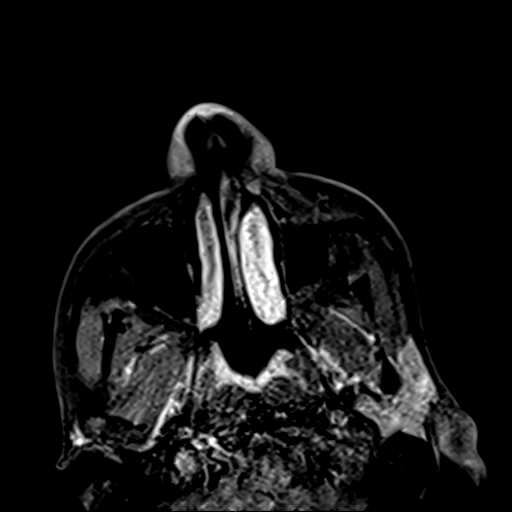

[Series 40: T1 post-contrast · coronal · 5.0mm · 0.34mm/px · 2 of 28 slices shown]
[im 1/28]
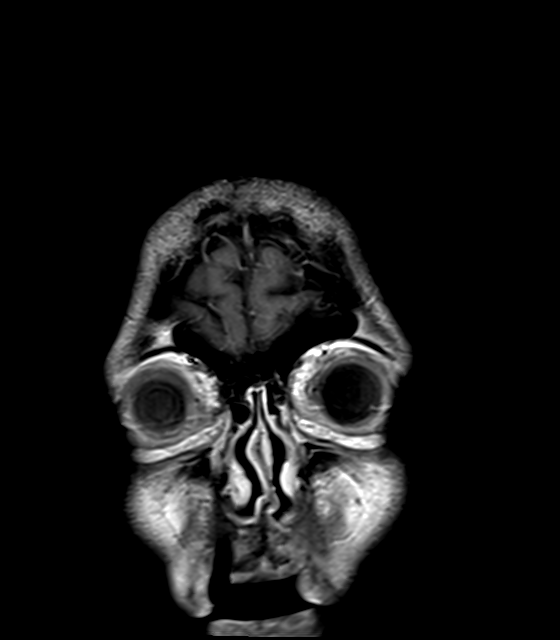
[im 28/28]
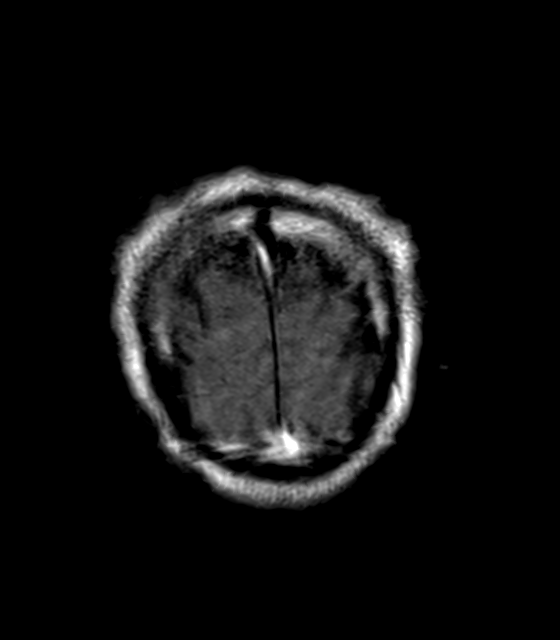

[Series 41: T1 fat-sat post-contrast · coronal · 3.0mm · 0.37mm/px · 2 of 30 slices shown (2 of 2)]
[im 1/30]
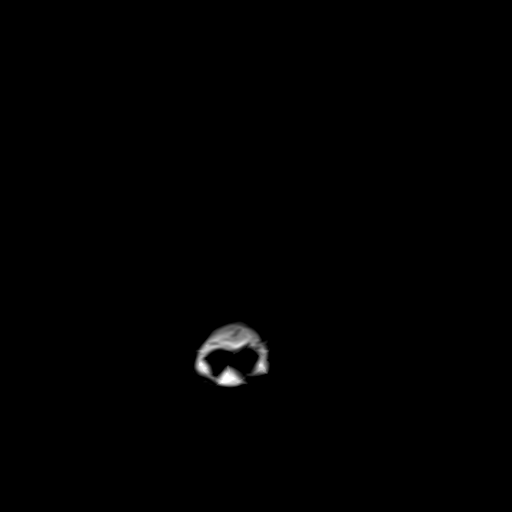
[im 30/30]
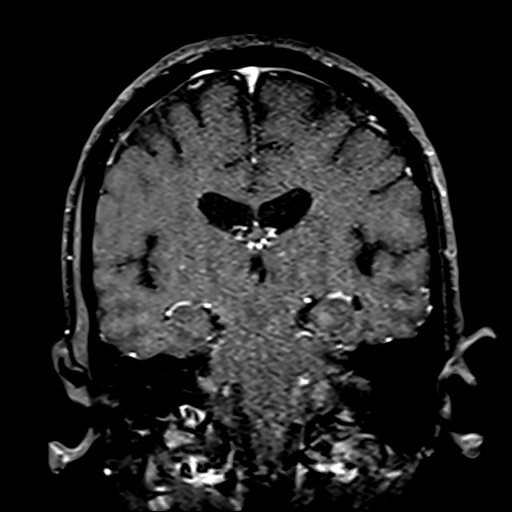

[33 of 48 positions shown; findings below may reference images not displayed]

FINDINGS: MRI HEAD FINDINGS

Brain: Improved appearance of the brain on diffusion-weighted
imaging compared to [DATE]. There are still areas of residual
diffusion abnormality scattered throughout the supratentorial brain
and brainstem, but these are decreased from the prior study.
Cerebellar abnormalities have substantially resolved. Areas of
hyperintense T2-weighted signal on FLAIR imaging are unchanged. The
areas of abnormal contrast enhancement have also decreased. There is
some persistent abnormal leptomeningeal enhancement in the
cerebellum and both posterior parietal lobes.

Vascular: Normal flow voids

Skull and upper cervical spine: Normal bone marrow signal

Other: None

MRI ORBITS FINDINGS

Orbits: There is abnormal contrast enhancement along the cisternal
segments of optic nerves (series 41 images 9 and 10). Extraocular
muscles are symmetric and normal. Flow voids within the superior
ophthalmic veins are normal. Lacrimal glands and fossa are normal.
The globes appear normal.

Visualized sinuses: Clear

Soft tissues: Normal
IMPRESSION: 1. Decreased areas of diffusion abnormality, hyperintense
T2-weighted signal and abnormal contrast enhancement within the
cerebellum and both cerebral hemispheres, consistent with response
to antibiotic therapy.
2. Abnormal contrast enhancement of the cisternal segments of both
optic nerves is consistent with infectious optic neuropathy.

## 2019-08-05 IMAGING — RF DG ABDOMEN 1V
1 series · 1 of 1 positions shown · non-contrast
Comparison: None.

CLINICAL DATA: Dysphagia. NG tube placement.

EXAM:
ABDOMEN - 1 VIEW

[Series 1: cp_standard · 0.26mm/px · 1 of 1 slices shown]
[im 1/1]
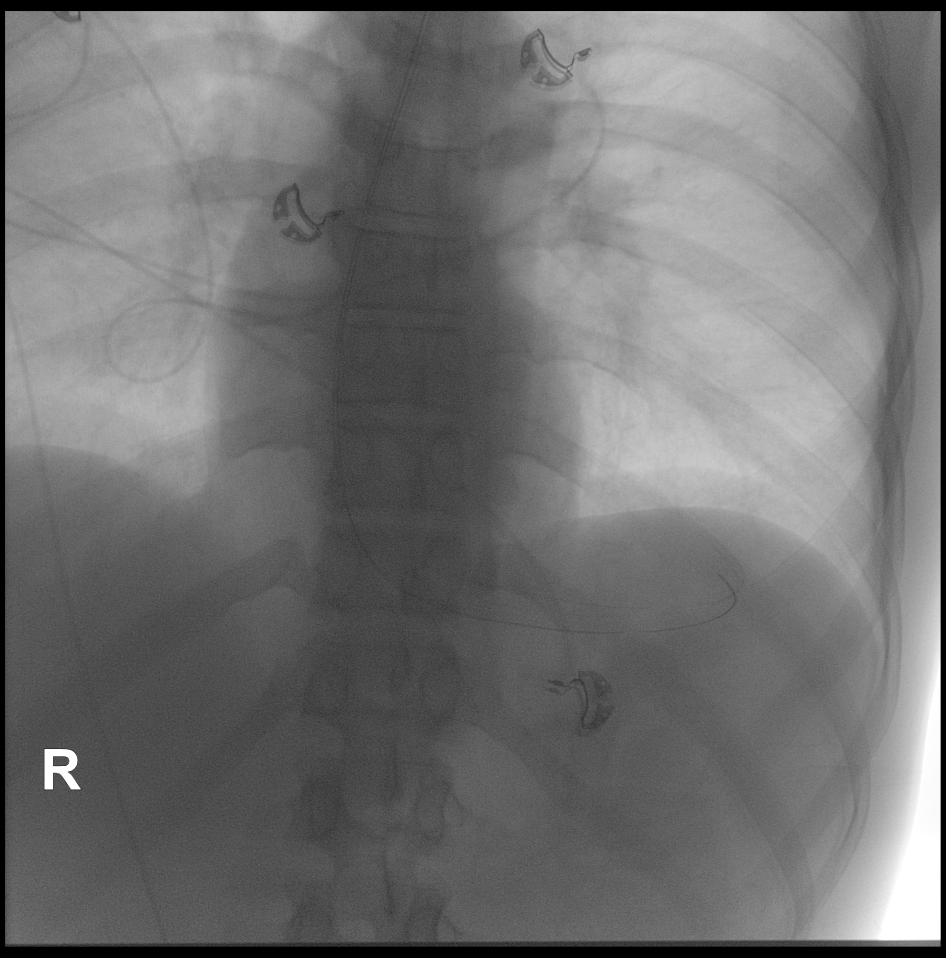

[1 of 1 positions shown; findings below may reference images not displayed]

FLUOROSCOPY TIME:  Fluoroscopy Time: 2 minutes 42 seconds

Radiation Exposure Index: 8.20 mGy
FINDINGS: A single fluoroscopic image demonstrates the tip and side hole of an
enteric tube in the left upper quadrant in the expected location of
the gastric fundus.
IMPRESSION: Enteric tube in the proximal stomach.

## 2019-08-05 MED ORDER — LIDOCAINE VISCOUS HCL 2 % MT SOLN
5.0000 mL | Freq: Once | OROMUCOSAL | Status: AC
  Administered 2019-08-05: 16:00:00 3 mL via OROMUCOSAL

## 2019-08-05 MED ORDER — LORAZEPAM 2 MG/ML IJ SOLN
1.0000 mg | Freq: Once | INTRAMUSCULAR | Status: AC
  Administered 2019-08-05: 1 mg via INTRAVENOUS

## 2019-08-05 MED ORDER — LIDOCAINE VISCOUS HCL 2 % MT SOLN
OROMUCOSAL | Status: AC
  Administered 2019-08-05: 3 mL via OROMUCOSAL
  Filled 2019-08-05: qty 15

## 2019-08-05 MED ORDER — GADOBUTROL 1 MMOL/ML IV SOLN
5.0000 mL | Freq: Once | INTRAVENOUS | Status: AC | PRN
  Administered 2019-08-05: 5 mL via INTRAVENOUS

## 2019-08-05 MED ORDER — LORAZEPAM 2 MG/ML IJ SOLN
INTRAMUSCULAR | Status: AC
  Filled 2019-08-05: qty 1

## 2019-08-05 NOTE — Progress Notes (Signed)
Pt had pulled out NG tube prior to going to MRI even with his mittens on. Dr. [place] Crane text paged  Via AMION that he had pulled it out.

## 2019-08-05 NOTE — Progress Notes (Signed)
Attempted to place NG tube x 2 without success. Pt was resisting placement by constatly moving his head away and hyperextending his body making it difficult to advance NG tube. Will update Dr. [place] Crane.

## 2019-08-05 NOTE — Progress Notes (Signed)
Physical Therapy Treatment Patient Details Name: Arthur [name] MRN: [id number] DOB: 10-21-1980 Today's Date: 08/05/2019    History of Present Illness Arthur [name] is a 38 y.o. male with medical history significant of untreated HIV, anemia, depression, GERD, substance abuse presenting with c/o dizziness, headache and weakness.  He was admitted with hyponatremia, bradycardia and late syphilis.    PT Comments    Pt's mobility continues to worsen. Recommend SNF at DC.    Follow Up Recommendations  SNF;Supervision/Assistance - 24 hour     Equipment Recommendations  Rolling walker with 5" wheels    Recommendations for Other Services       Precautions / Restrictions Precautions Precautions: Fall Precaution Comments: watch BP, HR Restrictions Weight Bearing Restrictions: No    Mobility  Bed Mobility Overal bed mobility: Needs Assistance Bed Mobility: Supine to Sit;Sit to Supine     Supine to sit: +2 for physical assistance;Total assist Sit to supine: Min assist;+2 for physical assistance;Total assist   General bed mobility comments: Pt assisting with bring legs off EOB. Assist with all other aspects  Transfers Overall transfer level: Needs assistance Equipment used: Rolling walker (2 wheeled) Transfers: Sit to/from Stand Sit to Stand: Mod assist;+2 physical assistance;Min assist         General transfer comment: Assist to bring hips up and for balance. Pt with posterior lean. Returning to sitting required assist for pt to flex at the hips to allow sitting  Ambulation/Gait             General Gait Details: Attempted to assist pt in side stepping up side of bed but pt unable and just created more of posterior lean   Stairs             Wheelchair Mobility    Modified Rankin (Stroke Patients Only)       Balance Overall balance assessment: Needs assistance Sitting-balance support: Feet supported;Bilateral upper extremity supported Sitting  balance-Leahy Scale: Poor Sitting balance - Comments: Pt initially with min assist and then incr to mod/max assist due to heavy posterior lean Postural control: Posterior lean Standing balance support: Bilateral upper extremity supported Standing balance-Leahy Scale: Poor Standing balance comment: Stood x 3 with walker with +2 mod assist due to heavy posterior. lean.                            Cognition Arousal/Alertness: Awake/alert Behavior During Therapy: Flat affect Overall Cognitive Status: Difficult to assess Area of Impairment: Problem solving;Awareness;Safety/judgement;Following commands                       Following Commands: Follows one step commands inconsistently Safety/Judgement: Decreased awareness of deficits;Decreased awareness of safety Awareness: Intellectual Problem Solving: Slow processing;Requires verbal cues;Requires tactile cues;Decreased initiation        Exercises      General Comments        Pertinent Vitals/Pain Pain Assessment: Faces Faces Pain Scale: No hurt    Home Living                      Prior Function            PT Goals (current goals can now be found in the care plan section) Progress towards PT goals: Not progressing toward goals - comment    Frequency    Min 2X/week      PT Plan Discharge plan needs to be updated;Frequency  needs to be updated    Co-evaluation              AM-PAC PT "6 Clicks" Mobility   Outcome Measure  Help needed turning from your back to your side while in a flat bed without using bedrails?: A Little Help needed moving from lying on your back to sitting on the side of a flat bed without using bedrails?: Total Help needed moving to and from a bed to a chair (including a wheelchair)?: Total Help needed standing up from a chair using your arms (e.g., wheelchair or bedside chair)?: Total Help needed to walk in hospital room?: Total Help needed climbing 3-5 steps  with a railing? : Total 6 Click Score: 8    End of Session         PT Visit Diagnosis: Other abnormalities of gait and mobility (R26.89);Muscle weakness (generalized) (M62.81)     Time: 1315-1340 PT Time Calculation (min) (ACUTE ONLY): 25 min  Charges:  $Therapeutic Activity: 8-22 mins                     [place] Pager [phone] Office [place] 08/05/2019, 2:09 PM

## 2019-08-05 NOTE — Progress Notes (Signed)
Updated the patient's mother via phone. All questions answered to her satisfaction.

## 2019-08-05 NOTE — Progress Notes (Signed)
Nutrition Follow-up  DOCUMENTATION CODES:   Underweight  INTERVENTION:   Monitor magnesium, potassium, and phosphorus daily for at least 3 days, MD to replete as needed, as pt is at risk for refeeding syndrome.  Tube feeding recommendations: -Initiate Osmolite 1.5 @ 20 ml/hr, advance by 10 ml every 8 hours to goal rate of 55 ml/hr. -30 ml Prostat BID -Provides 2180 kcals, 112g protein and 1005 ml H2O.  NUTRITION DIAGNOSIS:   Increased nutrient needs related to chronic illness as evidenced by estimated needs.  Ongoing.  GOAL:   Patient will meet greater than or equal to 90% of their needs  Not meeting.  MONITOR:   Supplement acceptance, PO intake, Labs, Weight trends, I & O's  REASON FOR ASSESSMENT:   Consult Enteral/tube feeding initiation and management  ASSESSMENT:   38 y.o. male with medical history significant of untreated HIV, anemia, depression, GERD, substance abuse  Admitted for intracerebral opportunistic infection  **RD working remotely**  Pt continues to be lethargic and not as alert. Per SLP note 11/8, recommend NPO given level of alertness and moderate risk of aspiration. MD has ordered for Cortrak. Per RD note 11/8 small bore feeding tube to be placed under fluoroscopy. If this is not possible, Cortrak placement service at bedside available 11/10.  Will provide recommendations and place orders once tube is ready. Pt is at refeeding syndrome risk given poor POs this admission (x8 days).  Admission weight: 125 lbs. Current weight (11/6): 121 lbs.  I/Os: +10.6L since admit UOP: 1.9L x 24 hrs  Medications: Vitamin B-6 tablet Labs reviewed: CBGs: 127-128  Diet Order:   Diet Order            Diet NPO time specified  Diet effective now              EDUCATION NEEDS:   Not appropriate for education at this time  Skin:  Skin Assessment: Reviewed RN Assessment  Last BM:  11/1  Height:   Ht Readings from Last 1 Encounters:  07/29/19 5\' 11"   (1.803 m)    Weight:   Wt Readings from Last 1 Encounters:  08/02/19 55.1 kg    Ideal Body Weight:  78.1 kg  BMI:  Body mass index is 16.94 kg/m.  Estimated Nutritional Needs:   Kcal:  2000-2200  Protein:  100-115g  Fluid:  2L/day   Clayton Bibles, MS, RD, LDN Inpatient Clinical Dietitian Pager: [phone] After Hours Pager: [phone]

## 2019-08-05 NOTE — Progress Notes (Signed)
PROGRESS NOTE  Arthur [name] UJW:[id number] DOB: 16-Mar-1981 DOA: 07/28/2019 PCP: Randall Hiss, MD  HPI/Recap of past 24 hours: Arthur Bussing Teagueis a 38 y.o.malewith medical history significant of untreatedHIV, anemia, depression, GERD, substance abusepresented to ED on 07/28/2019 with a complaint of dizziness, weakness, intermittent headache. He has not taken any of his HIV medications for about 1 year due to affordability issue. MRI brain done in ED showed Widespread areas of abnormal low level restricted diffusion and postcontrast enhancement throughout both cerebral hemispheres, predominantly infratentorial, with the dominant abnormality in the LEFT anterior frontal white matter consistent with an opportunistic infection, such as toxoplasmosis, tuberculosis or fungal cerebritis/meningitis, or parasitic infection.  Patient was admitted to hospital service and ID was consulted. He then revealed to [place] that he has lost his vision in his right eye. ID started him on penicillin and ganciclovir for possible CMV retinitis versus synovitis retinitis and ophthalmology also saw him.  07/30/2019-status post lumbar puncture by neurology.  11/4 - 11/6 -remains afebrile, no changes right eyesight blindness,ID continues to follow very closely, no confirmatory infection organism, ophthalmology also following closely He remains encephalopathic,confused poor p.o. intake,mild hypotensive.  08/05/19: Seen and examined. More alert today.  We will plan to insert NG tube, unsuccessful at bedside.  IR consulted for NG tube placement.  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  Currently on Received steroid injection 08/02/2019 Currently on ethambutol, isoniazid, rifampicin and pyridoxine, pyrazinamide for tuberculosis High-dose penicillin for neurosyphilis Ganciclovir for HSV encephalitis Bactrim for prophylaxis. Using as needed Haldol and bedside sitter for safety and fall risk. Currently not on antiretrovirals pending clinical improvement. Repeat MRI per ID recommendations possibly on 08/05/2019 or 08/06/2019  Hypovolemic hyponatremia TSH is within normal range Sodium 128, repeat every 4 hours Continue normal saline at 100 cc/h  Leukopenia/immunocompromised in the setting of untreated HIV WBC 2.7 Obtain CBC with differential tomorrow  Untreated HIV HIV treatment on hold Defer treatment to infectious disease  Hypomagnesemia Magnesium 1.7 Repleted with 2 g IV magnesium once  Acute metabolic encephalopathy: Due to #1.  As needed Ativan and Haldol.  Appreciate neurology input.  Dysphagia Seen by speech therapist N.p.o. IR consulted for possible NG tube placement.  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  Treating as above.  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  Seen by palliative care.  Currently recommending continuing aggressive measures.   DVT prophylaxis: Lovenox subcu daily Code Status: Full code Family Communication: None Disposition Plan:  Plan to DC when infectious disease signs off and patient is hemodynamically stable.   Consultants:   Neurology   Infectious disease  Palliative care  Procedures:   Lumbar puncture  Antimicrobials:  ethambutol, isoniazid, rifampicin and pyridoxine, pyrazinamide for tuberculosis High-dose penicillin for neurosyphilis Ganciclovir for HSV encephalitis Bactrim for prophylaxis  Objective: Vitals:   08/04/19 1600 08/05/19 0003 08/05/19 0700 08/05/19 1119  BP: 106/71 122/75 110/66 96/64  Pulse:  71 75   Resp:  (!) 22 (!) 25   Temp:  98.3 F (36.8 C) 98.4 F (36.9 C) 97.8 F (36.6 C)  TempSrc:  Axillary Axillary Oral  SpO2:  96% 95%   Weight:      Height:        Intake/Output Summary (Last 24 hours) at 08/05/2019 1202 Last data filed at 08/05/2019 0600 Gross per 24 hour  Intake 2282.81 ml  Output 1400 ml  Net 882.81 ml   Filed Weights   07/29/19 0753 07/30/19 0546 08/02/19 0615  Weight: 57 kg 57.6 kg 55.1 kg    Exam:  . General: 38 y.o. year-old male frail-appearing no acute distress.  More alert today.   . Cardiovascular: Regular rate and rhythm no rubs or gallops no JVD . Respiratory: Clear to station no wheezes no rales.  Poor inspiratory effort.   . Abdomen: Soft nontender nondistended bowel sounds present. . Musculoskeletal: No lower extremity edema.   [name] Psychiatry: Mood is appropriate for condition and setting.  Data Reviewed: CBC: Recent Labs  Lab 07/30/19 1113 08/01/19 0310 08/02/19 0335 08/03/19 0258 08/04/19 0332 08/05/19 0232  WBC 2.0* 4.5 3.6* 3.2* 3.8* 2.7*  NEUTROABS 1.3*  --   --   --   --   --   HGB 13.5 13.8 13.1 13.2 12.7* 14.3  HCT 39.7 39.2 36.7* 37.8* 36.9* 40.5  MCV 87.8 85.8 85.3 86.3 86.8 85.1  PLT 255 257 270 278 265 283   Basic Metabolic Panel: Recent Labs  Lab 07/30/19 1113 08/01/19 0310 08/02/19 0335 08/03/19 0258 08/04/19 0332  08/04/19 1439 08/04/19 1826 08/04/19 2334 08/05/19 0232 08/05/19 0703  NA 130* 129* 134* 132* 128*   < > 127* 128* 126* 127* 128*  K 4.2 4.7 4.3 4.6 4.6  --   --   --   --  4.3  --   CL 99 103 109  105 100  --   --   --   --  98  --   CO2 23 19* 20* 21* 23  --   --   --   --  22  --   GLUCOSE 103* 109* 111* 114* 123*  --   --   --   --  92  --   BUN --   --   --   --  8  --   CREATININE 0.85 0.88 0.78 0.81 0.72  --   --   --   --  0.70  --   CALCIUM 9.0 9.0 9.1 9.3 8.9  --   --   --   --  9.2  --   MG 1.7  --   --   --  1.7  --   --   --   --   --   --   PHOS  --   --   --   --  3.1  --   --   --   --   --   --    < > = values in this interval not displayed.   GFR: Estimated Creatinine Clearance: 99.5 mL/min (by C-G formula based on SCr of 0.7 mg/dL). Liver Function Tests: Recent Labs  Lab 08/01/19 0310 08/02/19 0335 08/03/19 0258 08/04/19 0332 08/05/19 0232  AST 26  ALT 14 14 17 14 16   ALKPHOS 45 44 47 46 53  BILITOT 0.6 0.2* 0.6 0.6 0.5  PROT 7.9 8.3* 8.4* 7.9 8.8*  ALBUMIN 2.6* 2.6* 2.9* 2.7* 3.0*   No results for input(s): LIPASE, AMYLASE in the last 168 hours. No results for input(s): AMMONIA in the last 168 hours. Coagulation Profile: Recent Labs  Lab 07/30/19 0852  INR 1.1   Cardiac Enzymes: No results for input(s): CKTOTAL, CKMB, CKMBINDEX, TROPONINI in the last 168 hours. BNP (last 3 results) No results for input(s): PROBNP in the last 8760 hours. HbA1C: No results for input(s): HGBA1C in the last 72 hours. CBG: No results for input(s): GLUCAP in the last 168 hours. Lipid Profile: No results for input(s): CHOL, HDL, LDLCALC, TRIG, CHOLHDL, LDLDIRECT in the last 72 hours. Thyroid Function Tests: No results for input(s): TSH, T4TOTAL, FREET4, T3FREE, THYROIDAB in the last 72 hours. Anemia Panel: No results for input(s): VITAMINB12, FOLATE, FERRITIN, TIBC, IRON, RETICCTPCT in the last 72 hours. Urine analysis:    Component Value Date/Time   COLORURINE AMBER (A) 07/26/2019 1317   APPEARANCEUR CLEAR 07/26/2019 1317   LABSPEC 1.029 07/26/2019 1317   PHURINE 6.0 07/26/2019 1317   GLUCOSEU NEGATIVE 07/26/2019 1317   HGBUR SMALL  (A) 07/26/2019 1317   BILIRUBINUR NEGATIVE 07/26/2019 1317   KETONESUR 20 (A) 07/26/2019 1317   PROTEINUR 30 (A) 07/26/2019 1317   UROBILINOGEN 1.0 03/06/2011 1601   NITRITE NEGATIVE 07/26/2019 1317   LEUKOCYTESUR NEGATIVE 07/26/2019 1317   Sepsis Labs: @LABRCNTIP (procalcitonin:4,lacticidven:4)  ) Recent Results (from the past 240 hour(s))  SARS CORONAVIRUS 2 (TAT 6-24 HRS) Nasopharyngeal Nasopharyngeal Swab     Status: None   Collection Time: 07/28/19  8:24 PM   Specimen: Nasopharyngeal Swab  Result Value Ref Range Status   SARS Coronavirus 2 NEGATIVE NEGATIVE Final    Comment: (NOTE) SARS-CoV-2 target nucleic acids are NOT DETECTED. The SARS-CoV-2 RNA is generally detectable in upper and lower respiratory specimens during the acute phase of infection. Negative results do not preclude SARS-CoV-2 infection, do not rule out co-infections with other pathogens, and should not be used as the sole basis for treatment or other patient management decisions. Negative results must be combined with clinical observations, patient history, and epidemiological information. The expected result is Negative. Fact Sheet for Patients: HairSlick.nohttps://www.fda.gov/media/138098/download Fact Sheet for Healthcare Providers: quierodirigir.comhttps://www.fda.gov/media/138095/download This test is not yet approved or cleared by the [place] FDA and  has been authorized for detection and/or diagnosis of SARS-CoV-2 by FDA under an Emergency Use Authorization (EUA). This EUA will remain  in effect (meaning this test can be used) for the duration of the COVID-19 declaration under Section 56 4(b)(1) of the Act, 21 U.S.C. section 360bbb-3(b)(1), unless the authorization is terminated or revoked sooner. Performed at [facility], [address] [place], [place] [address]   CSF culture with Stat gram stain     Status: None   Collection Time: 07/30/19  1:46 PM   Specimen: CSF; Cerebrospinal Fluid  Result Value Ref  Range Status   Specimen Description CSF  Final   Special Requests NONE  Final   Gram Stain   Final    WBC PRESENT, PREDOMINANTLY MONONUCLEAR NO ORGANISMS SEEN CYTOSPIN SMEAR    Culture   Final    NO GROWTH 3 DAYS Performed at [facility], [address] [place], [place] [address]    Report Status 08/02/2019 FINAL  Final  Acid Fast Smear (AFB)  Status: None   Collection Time: 07/30/19  1:46 PM  Result Value Ref Range Status   AFB Specimen Processing Concentration  Final   Acid Fast Smear Negative  Final    Comment: (NOTE) Performed At: [facility] [place], [place] [id number] Rush Farmer MD LO:[id number]    Source (AFB) CSF  Final    Comment: Performed at [facility], [place] [address], [place], [place] [address]      Studies: No results found.  Scheduled Meds: . enoxaparin (LOVENOX) injection  40 mg Subcutaneous Q24H  . ethambutol  1,200 mg Oral Daily  . feeding supplement  1 Container Oral BID WC  . feeding supplement (ENSURE ENLIVE)  237 mL Oral BID BM  . isoniazid  300 mg Oral Daily  . magic mouthwash w/lidocaine  10 mL Oral TID AC & HS  . mouth rinse  15 mL Mouth Rinse BID  . multivitamin with minerals  1 tablet Oral Daily  . pyrazinamide  1,500 mg Oral Daily  . vitamin B-6  50 mg Oral Daily  . rifampin  600 mg Oral Daily  . sodium chloride flush  3 mL Intravenous Once  . sodium chloride flush  3 mL Intravenous Q12H  . sulfamethoxazole-trimethoprim  1 tablet Oral Daily    Continuous Infusions: . sodium chloride 10 mL/hr at 07/29/19 0742  . sodium chloride 100 mL/hr at 08/05/19 0356  . chlorproMAZINE (THORAZINE) IV    . dexamethasone (DECADRON) IVPB ([facility]) 25 mg (08/04/19 1331)  . fluconazole (DIFLUCAN) IV 200 mg (08/05/19 0020)  . ganciclovir 285 mg (08/05/19 1015)  . penicillin g continuous IV infusion 12 Million Units (08/05/19 0354)     LOS: 7 days     Kayleen Memos, MD [facility] Pager  [phone]  If 7PM-7AM, please contact night-coverage [email or web address] Password [email or web address] 08/05/2019, 12:02 PM

## 2019-08-05 NOTE — Progress Notes (Signed)
  Speech Language Pathology Treatment: Dysphagia  Patient Details Name: Arthur [name] MRN: [id number] DOB: 02-26-1981 Today's Date: 08/05/2019 Time: 4268-3419 SLP Time Calculation (min) (ACUTE ONLY): 14 min  Assessment / Plan / Recommendation Clinical Impression  Pt was encountered asleep in bed and he remained lethargic throughout this evaluation, requiring max verbal and tactile cues to maintain alertness.  Pt with bilateral mitts/restraints and sitter present at bedside upon SLP arrival.  Pt was observed to have a wet vocal quality upon arrival and sitter reported that pt had been coughing up brown secretions beginning this AM.  RN was aware.  Pt was seen with minimal trials of thin liquid and honey-thick liquid via tsp.  He exhibited poor bolus acceptance with all trials c/b decreased labial opening when spoon was presented.  Pt additionally exhibited significantly prolonged AP transport and decreased lingual manipulation with all trials.  Pt with suspected delayed swallow initiation during thin liquid trial.  He exhibited an immediate, prolonged cough following thin liquid trial.  No swallow initiation was observed with honey-thick liquid trial despite max cues, therefore it was suctioned from the pt's oral cavity secondary to aspiration concerns.  RN stated that he planned to place a NG tube following this tx session.  Recommend continuation of NPO with short-term alternative means of nutrition.  SLP will f/u for dysphagia tx per POC.     HPI HPI: Arthur [name] is a 38 y.o. male with medical history significant of untreated HIV, anemia, depression, GERD, substance abuse.  He presented with worsening dizziness/lightheadedness, intermittent headaches but no nausea/vomiting or diarrhea but some decreased p.o. intake.  He had no fevers, chest pain or shortness of breath.  Patient states that he has not been able to afford his HIV medications and has not been taking them in a year.  Set up a  follow-up appointment with his ID doctor.  In the past week he has been seen in the emergency department 3 times for various complaints.  Most recent chest x-ray was unremarkable.  MRI of the brain was showing Widespread areas of abnormal low level restricted diffusion and postcontrast enhancement throughout both cerebral hemispheres,  predominantly infratentorial, with the dominant abnormality in the LEFT anterior frontal white matter. In this immunocompromised patient, the findings are most consistent with an opportunistic infection, such as toxoplasmosis, tuberculosis or fungal cerebritis/meningitis, or parasitic infection. Infectious disease consultation is warranted.  Generalized atrophy with mild small vessel disease elsewhere,  premature for age but not clearly acute.  No midline shift or acute hydrocephalus.      SLP Plan  Continue with current plan of care       Recommendations  Diet recommendations: NPO Liquids provided via: Teaspoon Medication Administration: Via alternative means                Oral Care Recommendations: Oral care QID;Staff/trained caregiver to provide oral care Follow up Recommendations: 24 hour supervision/assistance;Other (comment)(TBD) SLP Visit Diagnosis: Dysphagia, unspecified (R13.10) Plan: Continue with current plan of care                      Colin Mulders M.S., [place] Office: [phone]  [place] 08/05/2019, 11:37 AM

## 2019-08-05 NOTE — Progress Notes (Signed)
Occupational Therapy Treatment Patient Details Name: DAYSEAN TINKHAM MRN: [id number] DOB: 01-26-1981 Today's Date: 08/05/2019    History of present illness Arthur [name] is a 38 y.o. male with medical history significant of untreated HIV, anemia, depression, GERD, substance abuse presenting with c/o dizziness, headache and weakness.  He was admitted with hyponatremia, bradycardia and late syphilis.   OT comments  Pt making minimal progress towards OT goals this session. Pt required total A for bed mobility, MOD A +2 sit>stand with RW. Pt able to complete UB ADL from EOB with set- up/ supervision assist but required MOD A +2 to maintain sitting balance. Continue to recommend SNF level therapies, will continue to follow acutely per POC.    Follow Up Recommendations  SNF;Home health OT;Supervision/Assistance - 24 hour    Equipment Recommendations  3 in 1 bedside commode    Recommendations for Other Services      Precautions / Restrictions Precautions Precautions: Fall Precaution Comments: watch BP, HR Restrictions Weight Bearing Restrictions: No       Mobility Bed Mobility Overal bed mobility: Needs Assistance Bed Mobility: Supine to Sit;Sit to Supine     Supine to sit: +2 for physical assistance;Total assist Sit to supine: Min assist;+2 for physical assistance;Total assist   General bed mobility comments: Pt assisting with bring legs off EOB. Assist with all other aspects  Transfers Overall transfer level: Needs assistance Equipment used: Rolling walker (2 wheeled) Transfers: Sit to/from Stand Sit to Stand: Mod assist;+2 physical assistance;Min assist         General transfer comment: pt with heavy posterior lean, asist to power into standing, pt required assist to return to sitting as unable to flex hips without cues    Balance Overall balance assessment: Needs assistance Sitting-balance support: Feet supported;Bilateral upper extremity supported Sitting  balance-Leahy Scale: Poor Sitting balance - Comments: Pt initially with min assist and then incr to mod/max assist due to heavy posterior lean Postural control: Posterior lean Standing balance support: Bilateral upper extremity supported Standing balance-Leahy Scale: Poor Standing balance comment: Stood x 3 with walker with +2 mod assist due to heavy posterior. lean.                           ADL either performed or assessed with clinical judgement   ADL Overall ADL's : Needs assistance/impaired     Grooming: Wash/dry face;Sitting;Supervision/safety;Set up Grooming Details (indicate cue type and reason): able to wash face EOB with set- up assist and supervision; pt requires MOD A +2 for sitting balance as pt presents with heavy posterior lean         Upper Body Dressing : Maximal assistance;Sitting   Lower Body Dressing: Maximal assistance;Bed level     Toilet Transfer Details (indicate cue type and reason): unable to attempt transfer d/y heavy posterior leab, pt able to make small steps up towards [facility] with MODA +2 for standing balance with RW         Functional mobility during ADLs: Moderate assistance;+2 for physical assistance;+2 for safety/equipment;Rolling walker General ADL Comments: sesssion focus on sitting balance, functional sit>stands and seated UB ADLs, pt continues to be limited by generalized weakness     Vision Baseline Vision/History: (blind in R eye) Vision Assessment?: Vision impaired- to be further tested in functional context   Perception     Praxis      Cognition Arousal/Alertness: Awake/alert Behavior During Therapy: Flat affect Overall Cognitive Status: Difficult to assess  Area of Impairment: Problem solving;Awareness;Safety/judgement;Following commands                       Following Commands: Follows one step commands inconsistently Safety/Judgement: Decreased awareness of deficits;Decreased awareness of safety Awareness:  Intellectual Problem Solving: Slow processing;Requires verbal cues;Requires tactile cues;Decreased initiation General Comments: decreased safety awareness and insight to deficits        Exercises     Shoulder Instructions       General Comments      Pertinent Vitals/ Pain       Pain Assessment: Faces Faces Pain Scale: No hurt  Home Living                                          Prior Functioning/Environment              Frequency  Min 2X/week        Progress Toward Goals  OT Goals(current goals can now be found in the care plan section)  Progress towards OT goals: Progressing toward goals  Acute Rehab OT Goals Patient Stated Goal: get up to walk OT Goal Formulation: With patient Time For Goal Achievement: 08/13/19 Potential to Achieve Goals: Good  Plan Discharge plan remains appropriate    Co-evaluation    PT/OT/SLP Co-Evaluation/Treatment: Yes Reason for Co-Treatment: Complexity of the patient's impairments (multi-system involvement);For patient/therapist safety;To address functional/ADL transfers   OT goals addressed during session: ADL's and self-care      AM-PAC OT "6 Clicks" Daily Activity     Outcome Measure   Help from another person eating meals?: A Lot Help from another person taking care of personal grooming?: A Lot Help from another person toileting, which includes using toliet, bedpan, or urinal?: Total Help from another person bathing (including washing, rinsing, drying)?: A Lot Help from another person to put on and taking off regular upper body clothing?: A Lot Help from another person to put on and taking off regular lower body clothing?: Total 6 Click Score: 10    End of Session Equipment Utilized During Treatment: Rolling walker;Gait belt  OT Visit Diagnosis: Muscle weakness (generalized) (M62.81)   Activity Tolerance Patient limited by fatigue   Patient Left in bed;with nursing/sitter in room;with call  bell/phone within reach   Nurse Communication          Time: 1315-1340 OT Time Calculation (min): 25 min  Charges: OT General Charges $OT Visit: 1 Visit OT Treatments $Self Care/Home Management : 8-22 mins  Audery Amel., COTA/L Acute Rehabilitation Services [phone]    Angelina Pih 08/05/2019, 2:33 PM

## 2019-08-06 DIAGNOSIS — D72819 Decreased white blood cell count, unspecified: Secondary | ICD-10-CM

## 2019-08-06 LAB — HIV-1 RNA ULTRAQUANT REFLEX TO GENTYP+
HIV 1 RNA Quant: 113000 copies/mL — ABNORMAL HIGH
HIV-1 RNA Quant, Log: 5.05 Log copies/mL — ABNORMAL HIGH

## 2019-08-06 LAB — CBC
HCT: 33.6 % — ABNORMAL LOW (ref 39.0–52.0)
Hemoglobin: 11.8 g/dL — ABNORMAL LOW (ref 13.0–17.0)
MCH: 30.2 pg (ref 26.0–34.0)
MCHC: 35.1 g/dL (ref 30.0–36.0)
MCV: 85.9 fL (ref 80.0–100.0)
Platelets: 223 10*3/uL (ref 150–400)
RBC: 3.91 MIL/uL — ABNORMAL LOW (ref 4.22–5.81)
RDW: 13.4 % (ref 11.5–15.5)
WBC: 2 10*3/uL — ABNORMAL LOW (ref 4.0–10.5)
nRBC: 0 % (ref 0.0–0.2)

## 2019-08-06 LAB — COMPREHENSIVE METABOLIC PANEL
ALT: 15 U/L (ref 0–44)
AST: 20 U/L (ref 15–41)
Albumin: 2.5 g/dL — ABNORMAL LOW (ref 3.5–5.0)
Alkaline Phosphatase: 46 U/L (ref 38–126)
Anion gap: 7 (ref 5–15)
BUN: 7 mg/dL (ref 6–20)
CO2: 21 mmol/L — ABNORMAL LOW (ref 22–32)
Calcium: 8.8 mg/dL — ABNORMAL LOW (ref 8.9–10.3)
Chloride: 100 mmol/L (ref 98–111)
Creatinine, Ser: 0.63 mg/dL (ref 0.61–1.24)
GFR calc Af Amer: >60 mL/min (ref >60)
GFR calc non Af Amer: >60 mL/min (ref >60)
Glucose, Bld: 97 mg/dL (ref 70–99)
Potassium: 3.7 mmol/L (ref 3.5–5.1)
Sodium: 128 mmol/L — ABNORMAL LOW (ref 135–145)
Total Bilirubin: 0.6 mg/dL (ref 0.3–1.2)
Total Protein: 7.4 g/dL (ref 6.5–8.1)

## 2019-08-06 LAB — PHOSPHORUS
Phosphorus: 2.7 mg/dL (ref 2.5–4.6)
Phosphorus: 3.5 mg/dL (ref 2.5–4.6)

## 2019-08-06 LAB — GLUCOSE, CAPILLARY
Glucose-Capillary: 108 mg/dL — ABNORMAL HIGH (ref 70–99)
Glucose-Capillary: 116 mg/dL — ABNORMAL HIGH (ref 70–99)

## 2019-08-06 LAB — MAGNESIUM
Magnesium: 1.6 mg/dL — ABNORMAL LOW (ref 1.7–2.4)
Magnesium: 1.6 mg/dL — ABNORMAL LOW (ref 1.7–2.4)

## 2019-08-06 LAB — HIV-1 GENOTYPE: HIV-1 Genotype: DETECTED — AB

## 2019-08-06 MED ORDER — VITAMIN B-6 50 MG PO TABS
50.0000 mg | ORAL_TABLET | Freq: Every day | ORAL | Status: DC
  Administered 2019-08-07: 50 mg
  Filled 2019-08-06: qty 1

## 2019-08-06 MED ORDER — PYRAZINAMIDE 500 MG PO TABS
1500.0000 mg | ORAL_TABLET | Freq: Every day | ORAL | Status: DC
  Administered 2019-08-07: 1500 mg
  Filled 2019-08-06: qty 3

## 2019-08-06 MED ORDER — RIFAMPIN ORAL SUSPENSION 25 MG/ML
600.0000 mg | Freq: Every day | ORAL | Status: DC
  Filled 2019-08-06: qty 10

## 2019-08-06 MED ORDER — ISONIAZID 300 MG PO TABS
300.0000 mg | ORAL_TABLET | Freq: Every day | ORAL | Status: DC
  Administered 2019-08-07: 300 mg
  Filled 2019-08-06: qty 1

## 2019-08-06 MED ORDER — PRO-STAT SUGAR FREE PO LIQD
30.0000 mL | Freq: Two times a day (BID) | ORAL | Status: DC
  Administered 2019-08-06 – 2019-08-12 (×13): 30 mL
  Filled 2019-08-06 (×11): qty 30

## 2019-08-06 MED ORDER — SULFAMETHOXAZOLE-TRIMETHOPRIM 800-160 MG PO TABS
1.0000 | ORAL_TABLET | Freq: Every day | ORAL | Status: DC
  Administered 2019-08-07 – 2019-08-09 (×3): 1
  Filled 2019-08-06 (×3): qty 1

## 2019-08-06 MED ORDER — SODIUM CHLORIDE 0.9 % IV SOLN
20.0000 mg | Freq: Every day | INTRAVENOUS | Status: DC
  Administered 2019-08-07: 20 mg via INTRAVENOUS
  Filled 2019-08-06 (×2): qty 2

## 2019-08-06 MED ORDER — OSMOLITE 1.5 CAL PO LIQD
1000.0000 mL | ORAL | Status: DC
  Administered 2019-08-06 – 2019-08-12 (×6): 1000 mL
  Filled 2019-08-06 (×9): qty 1000

## 2019-08-06 MED ORDER — ETHAMBUTOL HCL 400 MG PO TABS
1200.0000 mg | ORAL_TABLET | Freq: Every day | ORAL | Status: DC
  Administered 2019-08-07: 1200 mg
  Filled 2019-08-06: qty 3

## 2019-08-06 MED ORDER — ADULT MULTIVITAMIN W/MINERALS CH
1.0000 | ORAL_TABLET | Freq: Every day | ORAL | Status: DC
  Administered 2019-08-07 – 2019-08-28 (×22): 1
  Filled 2019-08-06 (×22): qty 1

## 2019-08-06 NOTE — Procedures (Signed)
Cortrak  Person Inserting Tube:  Kamera Dubas M, RD Tube Type:  Cortrak - 43 inches Tube Location:  Right nare Initial Placement:  Stomach Secured by: Bridle Technique Used to Measure Tube Placement:  Documented cm marking at nare/ corner of mouth Cortrak Secured At:  66 cm Procedure Comments:  Cortrak Tube Team Note:  Consult received to place a Cortrak feeding tube.   No x-ray is required. RN may begin using tube.   If the tube becomes dislodged please keep the tube and contact the Cortrak team at [email or web address] (password [email or web address]) for replacement.  If after hours and replacement cannot be delayed, place a NG tube and confirm placement with an abdominal x-ray.     Julee Stoll, MS, RD, LDN, CNSC Inpatient Clinical Dietitian Pager # [phone] After hours/weekend pager # [phone]      

## 2019-08-06 NOTE — Progress Notes (Signed)
[facility]   Reason for visit: Follow up on CNS infection, AIDS.    Interval History: MRI with improvement overall.  No new complaints.  No acute events.  WBC remains leukopenic.  Afebrile.   MRI independently reviewed   Physical Exam: Constitutional:  Vitals:   08/06/19 0723 08/06/19 1056  BP: (!) 141/88 107/88  Pulse: (!) 50 69  Resp: 16 18  Temp: 97.6 F (36.4 C) (!) 97.4 F (36.3 C)  SpO2: 99% 92%   patient appears in NAD Respiratory: Normal respiratory effort; CTA B Cardiovascular: RRR GI: soft, nt, nd MS: no edema Neuro: does not respond to questions much but does nod appropriately  Review of Systems: Unable to be assessed due to patient factors  Lab Results  Component Value Date   WBC 2.0 (L) 08/06/2019   HGB 11.8 (L) 08/06/2019   HCT 33.6 (L) 08/06/2019   MCV 85.9 08/06/2019   PLT 223 08/06/2019    Lab Results  Component Value Date   CREATININE 0.63 08/06/2019   BUN 7 08/06/2019   NA 128 (L) 08/06/2019   K 3.7 08/06/2019   CL 100 08/06/2019   CO2 21 (L) 08/06/2019    Lab Results  Component Value Date   ALT 15 08/06/2019   AST 20 08/06/2019   ALKPHOS 46 08/06/2019     Microbiology: Recent Results (from the past 240 hour(s))  SARS CORONAVIRUS 2 (TAT 6-24 HRS) Nasopharyngeal Nasopharyngeal Swab     Status: None   Collection Time: 07/28/19  8:24 PM   Specimen: Nasopharyngeal Swab  Result Value Ref Range Status   SARS Coronavirus 2 NEGATIVE NEGATIVE Final    Comment: (NOTE) SARS-CoV-2 target nucleic acids are NOT DETECTED. The SARS-CoV-2 RNA is generally detectable in upper and lower respiratory specimens during the acute phase of infection. Negative results do not preclude SARS-CoV-2 infection, do not rule out co-infections with other pathogens, and should not be used as the sole basis for treatment or other patient management decisions. Negative results must be combined with clinical observations, patient  history, and epidemiological information. The expected result is Negative. Fact Sheet for Patients: HairSlick.no Fact Sheet for Healthcare Providers: quierodirigir.com This test is not yet approved or cleared by the [place] FDA and  has been authorized for detection and/or diagnosis of SARS-CoV-2 by FDA under an Emergency Use Authorization (EUA). This EUA will remain  in effect (meaning this test can be used) for the duration of the COVID-19 declaration under Section 56 4(b)(1) of the Act, 21 U.S.C. section 360bbb-3(b)(1), unless the authorization is terminated or revoked sooner. Performed at [facility], [address], [place], [place] [address]   CSF culture with Stat gram stain     Status: None   Collection Time: 07/30/19  1:46 PM   Specimen: CSF; Cerebrospinal Fluid  Result Value Ref Range Status   Specimen Description CSF  Final   Special Requests NONE  Final   Gram Stain   Final    WBC PRESENT, PREDOMINANTLY MONONUCLEAR NO ORGANISMS SEEN CYTOSPIN SMEAR    Culture   Final    NO GROWTH 3 DAYS Performed at [facility], [address], [place], [place] [address]    Report Status 08/02/2019 FINAL  Final  Acid Fast Smear (AFB)     Status: None   Collection Time: 07/30/19  1:46 PM  Result Value Ref Range Status   AFB Specimen Processing Concentration  Final  Acid Fast Smear Negative  Final    Comment: (NOTE) Performed At: [facility] [place], [place] [id number] Rush Farmer MD JT:[id number]    Source (AFB) CSF  Final    Comment: Performed at [facility], [place] [address], [place], [place] [address]    Impression/Plan:  1. CNS lesions.  Unclear what is causing these but improved on the repeat MRI.  Differential is broad but the leading differential is TB, fungal, syphilitic gummas, toxo.  It seems to be responding to current therapy and so this does suggest that  either syphilis or Tb is the cause.   Still waiting on MTB probe. Continue with penicillin Also with positive CMV and with a high viral load in the CSF, concern for retinitis.  Will continue with ganciclovir.    2.  Optic neurititis, retinitis - as above, differential is broad and includes syphilis, CMV, Tb. Will continue with current treatment.  3.  HIV/AIDS - holding on starting ARVs with concerns for different opportunistic infections or Tb.  Will continue to hold for now.

## 2019-08-06 NOTE — Progress Notes (Signed)
Updated the patient's mother via phone. All questions answered to her satisfaction. 

## 2019-08-06 NOTE — Plan of Care (Signed)
  Problem: Clinical Measurements: Goal: Respiratory complications will improve Outcome: Progressing Note:  No s/s of respiratory complications noted.  Stable on  room air.   

## 2019-08-06 NOTE — Progress Notes (Signed)
Nutrition Brief Note  Cortrak tube placed today. RD placed TF orders from last follow-up note on 11/9.  -Initiate Osmolite 1.5 @ 20 ml/hr, advance by 10 ml every 8 hours to goal rate of 55 ml/hr. -30 ml Prostat BID -Provides 2180 kcals, 112g protein and 1005 ml H2O.  Recommend monitor magnesium, potassium, and phosphorus daily for at least 3 days, MD to replete as needed, as pt is at risk for refeeding syndrome.  Will continue to monitor. If nutrition issues arise, please consult RD.   Clayton Bibles, MS, RD, LDN Inpatient Clinical Dietitian Pager: [phone] After Hours Pager: [phone]

## 2019-08-06 NOTE — Progress Notes (Signed)
PROGRESS NOTE  Arthur [name] MCN:[id number] DOB: [birth year] DOA: 07/28/2019 PCP: Randall Hiss, MD  HPI/Recap of past 24 hours: Arthur Bussing Teagueis a 38 y.o.malewith medical history significant of untreatedHIV, anemia, depression, GERD, substance abusepresented to ED on 07/28/2019 with a complaint of dizziness, weakness, intermittent headache. He has not taken any of his HIV medications for about 1 year due to affordability issue. MRI brain done in ED showed Widespread areas of abnormal low level restricted diffusion and postcontrast enhancement throughout both cerebral hemispheres, predominantly infratentorial, with the dominant abnormality in the LEFT anterior frontal white matter consistent with an opportunistic infection, such as toxoplasmosis, tuberculosis or fungal cerebritis/meningitis, or parasitic infection.  Patient was admitted to hospital service and ID was consulted. He then revealed to [place] that he has lost his vision in his right eye. ID started him on penicillin and ganciclovir for possible CMV retinitis versus synovitis retinitis and ophthalmology also saw him.  07/30/2019-status post lumbar puncture by neurology.  11/4 - 11/6 -remains afebrile, no changes right eyesight blindness,ID continues to follow very closely, no confirmatory infection organism, ophthalmology also following closely He remains encephalopathic,confused poor p.o. intake,mild hypotensive.  08/06/19: Patient was seen and examined at his bedside this morning.  Somnolent with minimal interaction.  MRI brain showing improvement of lesions, responding to treatment.   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  MTB probe pending. 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 Using as needed Haldol and bedside sitter for safety and fall risk. Currently not on antiretrovirals pending clinical improvement. Repeat MRI brain and orbits show improvement of brain lesions and optic neurititis, retinitis.  Optic retinitis/neurititis Management per infectious disease  Hypovolemic hyponatremia TSH is within normal range Sodium 128 Repeat BMP daily Continue normal saline at 100 cc/h.  Leukopenia/immunocompromised in the setting of untreated HIV WBC 2.7>> 2.0. Continue to closely monitor  Untreated HIV HIV treatment on hold Defer treatment to infectious disease  Hypomagnesemia Magnesium 1.7 Repleted with 2 g IV magnesium once  Acute metabolic encephalopathy: Due to #1.  As needed Ativan and Haldol.  Appreciate neurology input.  Dysphagia Seen by speech therapist N.p.o. Cortrack team consulted for placement Dietitian for PEG tube feeding  Severe protein calorie malnutrition Albumin 2.7 with BMI 16 Dietitian consult Plan for cortrack tube placement on 08/06/2019.  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  Treating as above.  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  Seen by palliative care.  Currently recommending continuing aggressive measures.   DVT prophylaxis: Lovenox subcu daily Code Status: Full  code Family Communication: None Disposition Plan:  Plan to DC when infectious disease signs off and patient is hemodynamically stable.   Consultants:   Neurology  Infectious disease  Palliative care  Procedures:   Lumbar puncture  Antimicrobials:  ethambutol, isoniazid, rifampicin  and pyridoxine, pyrazinamide for tuberculosis High-dose penicillin for neurosyphilis Ganciclovir for HSV encephalitis Bactrim for prophylaxis    Objective: Vitals:   08/05/19 2357 08/06/19 0359 08/06/19 0723 08/06/19 1056  BP: 112/75 97/73 (!) 141/88 107/88  Pulse:   (!) 50 69  Resp:   16 18  Temp: 98 F (36.7 C) 97.8 F (36.6 C) 97.6 F (36.4 C) (!) 97.4 F (36.3 C)  TempSrc: Axillary Axillary Axillary Axillary  SpO2:   99% 92%  Weight:      Height:        Intake/Output Summary (Last 24 hours) at 08/06/2019 1400 Last data filed at 08/06/2019 1200 Gross per 24 hour  Intake 3423.68 ml  Output 2420 ml  Net 1003.68 ml   Filed Weights   07/29/19 0753 07/30/19 0546 08/02/19 0615  Weight: 57 kg 57.6 kg 55.1 kg    Exam:   General: 38 y.o. year-old male frail-appearing in no acute distress.  Somnolent.  Minimal interaction.  Cardiovascular: Regular rate and rhythm no rubs or gallops.  Respiratory: Clear To Auscultation No Wheezes No Rales.  Poor inspiratory effort.    Abdomen: Soft nontender nondistended with bowel sounds.  Musculoskeletal: No lower extremity edema.   Psychiatry: Mood is appropriate for condition and setting.   Data Reviewed: CBC: Recent Labs  Lab 08/02/19 0335 08/03/19 0258 08/04/19 0332 08/05/19 0232 08/06/19 0704  WBC 3.6* 3.2* 3.8* 2.7* 2.0*  HGB 13.1 13.2 12.7* 14.3 11.8*  HCT 36.7* 37.8* 36.9* 40.5 33.6*  MCV 85.3 86.3 86.8 85.1 85.9  PLT 270 278 265 283 223   Basic Metabolic Panel: Recent Labs  Lab 08/02/19 0335 08/03/19 0258 08/04/19 0332  08/04/19 2334 08/05/19 0232 08/05/19 0703 08/05/19 1124 08/06/19 0321  NA 134* 132*  128*   < > 126* 127* 128* 130* 128*  K 4.3 4.6 4.6  --   --  4.3  --   --  3.7  CL 109 105 100  --   --  98  --   --  100  CO2 20* 21* 23  --   --  22  --   --  21*  GLUCOSE 111* 114* 123*  --   --  92  --   --  97  BUN --   --  8  --   --  7  CREATININE 0.78 0.81 0.72  --   --  0.70  --   --  0.63  CALCIUM 9.1 9.3 8.9  --   --  9.2  --   --  8.8*  MG  --   --  1.7  --   --   --   --   --   --   PHOS  --   --  3.1  --   --   --   --   --   --    < > = values in this interval not displayed.   GFR: Estimated Creatinine Clearance: 99.5 mL/min (by C-G formula based on SCr of 0.63 mg/dL). Liver Function Tests: Recent Labs  Lab 08/02/19 0335 08/03/19 0258 08/04/19 0332 08/05/19 0232 08/06/19 0321  AST ALT ALKPHOS 44 47 46 53 46  BILITOT 0.2* 0.6 0.6 0.5 0.6  PROT 8.3* 8.4* 7.9 8.8* 7.4  ALBUMIN 2.6* 2.9* 2.7* 3.0* 2.5*   No results for input(s): LIPASE, AMYLASE in the last 168 hours. No  results for input(s): AMMONIA in the last 168 hours. Coagulation Profile: No results for input(s): INR, PROTIME in the last 168 hours. Cardiac Enzymes: No results for input(s): CKTOTAL, CKMB, CKMBINDEX, TROPONINI in the last 168 hours. BNP (last 3 results) No results for input(s): PROBNP in the last 8760 hours. HbA1C: No results for input(s): HGBA1C in the last 72 hours. CBG: No results for input(s): GLUCAP in the last 168 hours. Lipid Profile: No results for input(s): CHOL, HDL, LDLCALC, TRIG, CHOLHDL, LDLDIRECT in the last 72 hours. Thyroid Function Tests: No results for input(s): TSH, T4TOTAL, FREET4, T3FREE, THYROIDAB in the last 72 hours. Anemia Panel: No results for input(s): VITAMINB12, FOLATE, FERRITIN, TIBC, IRON, RETICCTPCT in the last 72 hours. Urine analysis:    Component Value Date/Time   COLORURINE AMBER (A) 07/26/2019 1317   APPEARANCEUR CLEAR 07/26/2019 1317   LABSPEC 1.029 07/26/2019 1317   PHURINE 6.0 07/26/2019 1317   GLUCOSEU  NEGATIVE 07/26/2019 1317   HGBUR SMALL (A) 07/26/2019 1317   BILIRUBINUR NEGATIVE 07/26/2019 1317   KETONESUR 20 (A) 07/26/2019 1317   PROTEINUR 30 (A) 07/26/2019 1317   UROBILINOGEN 1.0 03/06/2011 1601   NITRITE NEGATIVE 07/26/2019 1317   LEUKOCYTESUR NEGATIVE 07/26/2019 1317   Sepsis Labs: @LABRCNTIP (procalcitonin:4,lacticidven:4)  ) Recent Results (from the past 240 hour(s))  SARS CORONAVIRUS 2 (TAT 6-24 HRS) Nasopharyngeal Nasopharyngeal Swab     Status: None   Collection Time: 07/28/19  8:24 PM   Specimen: Nasopharyngeal Swab  Result Value Ref Range Status   SARS Coronavirus 2 NEGATIVE NEGATIVE Final    Comment: (NOTE) SARS-CoV-2 target nucleic acids are NOT DETECTED. The SARS-CoV-2 RNA is generally detectable in upper and lower respiratory specimens during the acute phase of infection. Negative results do not preclude SARS-CoV-2 infection, do not rule out co-infections with other pathogens, and should not be used as the sole basis for treatment or other patient management decisions. Negative results must be combined with clinical observations, patient history, and epidemiological information. The expected result is Negative. Fact Sheet for Patients: SugarRoll.be Fact Sheet for Healthcare Providers: https://www.woods-mathews.com/ This test is not yet approved or cleared by the [place] FDA and  has been authorized for detection and/or diagnosis of SARS-CoV-2 by FDA under an Emergency Use Authorization (EUA). This EUA will remain  in effect (meaning this test can be used) for the duration of the COVID-19 declaration under Section 56 4(b)(1) of the Act, 21 U.S.C. section 360bbb-3(b)(1), unless the authorization is terminated or revoked sooner. Performed at [facility], [place] [address], [place], [place] [address]   CSF culture with Stat gram stain     Status: None   Collection Time: 07/30/19  1:46 PM   Specimen: CSF;  Cerebrospinal Fluid  Result Value Ref Range Status   Specimen Description CSF  Final   Special Requests NONE  Final   Gram Stain   Final    WBC PRESENT, PREDOMINANTLY MONONUCLEAR NO ORGANISMS SEEN CYTOSPIN SMEAR    Culture   Final    NO GROWTH 3 DAYS Performed at [facility], [place] [address], [place], [place] [address]    Report Status 08/02/2019 FINAL  Final  Acid Fast Smear (AFB)     Status: None   Collection Time: 07/30/19  1:46 PM  Result Value Ref Range Status   AFB Specimen Processing Concentration  Final   Acid Fast Smear Negative  Final    Comment: (NOTE) Performed At: [facility] [place], [place]  [id number] Jolene Schimke MD WU:[id number]    Source (AFB) CSF  Final    Comment: Performed at [facility], [address], [place], [place] [address]      Studies: Dg Abd 1 View  Result Date: 08/05/2019 CLINICAL DATA:  Dysphagia. NG tube placement. EXAM: ABDOMEN - 1 VIEW COMPARISON:  None. FLUOROSCOPY TIME:  Fluoroscopy Time: 2 minutes 42 seconds Radiation Exposure Index: 8.20 mGy FINDINGS: A single fluoroscopic image demonstrates the tip and side hole of an enteric tube in the left upper quadrant in the expected location of the gastric fundus. IMPRESSION: Enteric tube in the proximal stomach. Electronically Signed   By: Sebastian Ache M.D.   On: 08/05/2019 16:35   Mr Laqueta Jean HY Contrast  Result Date: 08/05/2019 CLINICAL DATA:  Optic neuritis. Possible intracranial infection. Compromised immune status. EXAM: MRI HEAD AND ORBITS WITHOUT AND WITH CONTRAST TECHNIQUE: Multiplanar, multiecho pulse sequences of the brain and surrounding structures were obtained without and with intravenous contrast. Multiplanar, multiecho pulse sequences of the orbits and surrounding structures were obtained including fat saturation techniques, before and after intravenous contrast administration. CONTRAST:  5mL GADAVIST GADOBUTROL 1 MMOL/ML IV SOLN COMPARISON:   Brain MRI 07/28/2019 FINDINGS: MRI HEAD FINDINGS Brain: Improved appearance of the brain on diffusion-weighted imaging compared to 08/05/2019. There are still areas of residual diffusion abnormality scattered throughout the supratentorial brain and brainstem, but these are decreased from the prior study. Cerebellar abnormalities have substantially resolved. Areas of hyperintense T2-weighted signal on FLAIR imaging are unchanged. The areas of abnormal contrast enhancement have also decreased. There is some persistent abnormal leptomeningeal enhancement in the cerebellum and both posterior parietal lobes. Vascular: Normal flow voids Skull and upper cervical spine: Normal bone marrow signal Other: None MRI ORBITS FINDINGS Orbits: There is abnormal contrast enhancement along the cisternal segments of optic nerves (series 41 images 9 and 10). Extraocular muscles are symmetric and normal. Flow voids within the superior ophthalmic veins are normal. Lacrimal glands and fossa are normal. The globes appear normal. Visualized sinuses: Clear Soft tissues: Normal IMPRESSION: 1. Decreased areas of diffusion abnormality, hyperintense T2-weighted signal and abnormal contrast enhancement within the cerebellum and both cerebral hemispheres, consistent with response to antibiotic therapy. 2. Abnormal contrast enhancement of the cisternal segments of both optic nerves is consistent with infectious optic neuropathy. Electronically Signed   By: Deatra Robinson M.D.   On: 08/05/2019 20:01   Mr Rockwell [place] QM Contrast  Result Date: 08/05/2019 CLINICAL DATA:  Optic neuritis. Possible intracranial infection. Compromised immune status. EXAM: MRI HEAD AND ORBITS WITHOUT AND WITH CONTRAST TECHNIQUE: Multiplanar, multiecho pulse sequences of the brain and surrounding structures were obtained without and with intravenous contrast. Multiplanar, multiecho pulse sequences of the orbits and surrounding structures were obtained including fat saturation  techniques, before and after intravenous contrast administration. CONTRAST:  5mL GADAVIST GADOBUTROL 1 MMOL/ML IV SOLN COMPARISON:  Brain MRI 07/28/2019 FINDINGS: MRI HEAD FINDINGS Brain: Improved appearance of the brain on diffusion-weighted imaging compared to 08/05/2019. There are still areas of residual diffusion abnormality scattered throughout the supratentorial brain and brainstem, but these are decreased from the prior study. Cerebellar abnormalities have substantially resolved. Areas of hyperintense T2-weighted signal on FLAIR imaging are unchanged. The areas of abnormal contrast enhancement have also decreased. There is some persistent abnormal leptomeningeal enhancement in the cerebellum and both posterior parietal lobes. Vascular: Normal flow voids Skull and upper cervical spine: Normal bone marrow signal Other: None MRI ORBITS FINDINGS Orbits: There is abnormal contrast  enhancement along the cisternal segments of optic nerves (series 41 images 9 and 10). Extraocular muscles are symmetric and normal. Flow voids within the superior ophthalmic veins are normal. Lacrimal glands and fossa are normal. The globes appear normal. Visualized sinuses: Clear Soft tissues: Normal IMPRESSION: 1. Decreased areas of diffusion abnormality, hyperintense T2-weighted signal and abnormal contrast enhancement within the cerebellum and both cerebral hemispheres, consistent with response to antibiotic therapy. 2. Abnormal contrast enhancement of the cisternal segments of both optic nerves is consistent with infectious optic neuropathy. Electronically Signed   By: Deatra RobinsonKevin  Herman M.D.   On: 08/05/2019 20:01    Scheduled Meds:  enoxaparin (LOVENOX) injection  40 mg Subcutaneous Q24H   ethambutol  1,200 mg Oral Daily   feeding supplement  1 Container Oral BID WC   feeding supplement (ENSURE ENLIVE)  237 mL Oral BID BM   isoniazid  300 mg Oral Daily   magic mouthwash w/lidocaine  10 mL Oral TID AC & HS   mouth rinse   15 mL Mouth Rinse BID   multivitamin with minerals  1 tablet Oral Daily   pyrazinamide  1,500 mg Oral Daily   vitamin B-6  50 mg Oral Daily   rifampin  600 mg Oral Daily   sodium chloride flush  3 mL Intravenous Once   sodium chloride flush  3 mL Intravenous Q12H   sulfamethoxazole-trimethoprim  1 tablet Oral Daily    Continuous Infusions:  sodium chloride 10 mL/hr at 07/29/19 0742   sodium chloride 100 mL/hr at 08/06/19 0331   chlorproMAZINE (THORAZINE) IV 12.5 mg (08/06/19 0103)   [START ON 08/07/2019] dexamethasone (DECADRON) IVPB ([facility])     dexamethasone (DECADRON) IVPB ([facility]) 25 mg (08/06/19 1149)   fluconazole (DIFLUCAN) IV 200 mg (08/05/19 1945)   ganciclovir 285 mg (08/06/19 1033)   penicillin g continuous IV infusion 12 Million Units (08/05/19 1946)     LOS: 8 days     Darlin Droparole N Juelz Whittenberg, MD [facility] Pager [phone]  If 7PM-7AM, please contact night-coverage [email or web address] Password [email or web address] 08/06/2019, 2:00 PM

## 2019-08-07 ENCOUNTER — Encounter: Payer: Self-pay | Admitting: Infectious Diseases

## 2019-08-07 LAB — GLUCOSE, CAPILLARY
Glucose-Capillary: 101 mg/dL — ABNORMAL HIGH (ref 70–99)
Glucose-Capillary: 106 mg/dL — ABNORMAL HIGH (ref 70–99)
Glucose-Capillary: 112 mg/dL — ABNORMAL HIGH (ref 70–99)
Glucose-Capillary: 130 mg/dL — ABNORMAL HIGH (ref 70–99)
Glucose-Capillary: 91 mg/dL (ref 70–99)
Glucose-Capillary: 95 mg/dL (ref 70–99)
Glucose-Capillary: 99 mg/dL (ref 70–99)

## 2019-08-07 LAB — BASIC METABOLIC PANEL
Anion gap: 5 (ref 5–15)
BUN: 10 mg/dL (ref 6–20)
CO2: 21 mmol/L — ABNORMAL LOW (ref 22–32)
Calcium: 8.6 mg/dL — ABNORMAL LOW (ref 8.9–10.3)
Chloride: 99 mmol/L (ref 98–111)
Creatinine, Ser: 0.63 mg/dL (ref 0.61–1.24)
GFR calc Af Amer: >60 mL/min (ref >60)
GFR calc non Af Amer: >60 mL/min (ref >60)
Glucose, Bld: 92 mg/dL (ref 70–99)
Potassium: 4.5 mmol/L (ref 3.5–5.1)
Sodium: 125 mmol/L — ABNORMAL LOW (ref 135–145)

## 2019-08-07 LAB — MAGNESIUM
Magnesium: 1.7 mg/dL (ref 1.7–2.4)
Magnesium: 1.8 mg/dL (ref 1.7–2.4)

## 2019-08-07 LAB — CBC
HCT: 34.5 % — ABNORMAL LOW (ref 39.0–52.0)
Hemoglobin: 12.1 g/dL — ABNORMAL LOW (ref 13.0–17.0)
MCH: 30.4 pg (ref 26.0–34.0)
MCHC: 35.1 g/dL (ref 30.0–36.0)
MCV: 86.7 fL (ref 80.0–100.0)
Platelets: 241 10*3/uL (ref 150–400)
RBC: 3.98 MIL/uL — ABNORMAL LOW (ref 4.22–5.81)
RDW: 13.5 % (ref 11.5–15.5)
WBC: 2.3 10*3/uL — ABNORMAL LOW (ref 4.0–10.5)
nRBC: 0 % (ref 0.0–0.2)

## 2019-08-07 LAB — TOXOPLASMA GONDII, PCR: Toxoplasma Gondii, PCR: POSITIVE — AB

## 2019-08-07 LAB — PHOSPHORUS
Phosphorus: 2.9 mg/dL (ref 2.5–4.6)
Phosphorus: 3.1 mg/dL (ref 2.5–4.6)

## 2019-08-07 MED ORDER — BICTEGRAVIR-EMTRICITAB-TENOFOV 50-200-25 MG PO TABS
1.0000 | ORAL_TABLET | Freq: Every day | ORAL | Status: DC
  Filled 2019-08-07: qty 1

## 2019-08-07 MED ORDER — BICTEGRAVIR-EMTRICITAB-TENOFOV 50-200-25 MG PO TABS
1.0000 | ORAL_TABLET | Freq: Every day | ORAL | Status: DC
  Administered 2019-08-08 – 2019-08-09 (×2): 1 via ORAL
  Filled 2019-08-07 (×3): qty 1

## 2019-08-07 MED ORDER — RIFAMPIN ORAL SUSPENSION 25 MG/ML
600.0000 mg | Freq: Every day | ORAL | Status: DC
  Filled 2019-08-07: qty 10

## 2019-08-07 NOTE — Progress Notes (Addendum)
[facility]   Reason for visit: Follow up on CNS infection, AIDS.    Interval History: No acute events.  WBC 2.3, down from previous. Creat wnl.  No rash.   Physical Exam: Constitutional:  Vitals:   08/07/19 0800 08/07/19 1153  BP:  103/67  Pulse: 82   Resp:    Temp:  97.6 F (36.4 C)  SpO2: 96% 92%   patient appears in NAD Respiratory: Normal respiratory effort; CTA B Cardiovascular: RRR GI: soft, nt, nd MS: no edema Neuro: does not respond to questions  Review of Systems: Unable to be assessed due to patient factors  Lab Results  Component Value Date   WBC 2.3 (L) 08/07/2019   HGB 12.1 (L) 08/07/2019   HCT 34.5 (L) 08/07/2019   MCV 86.7 08/07/2019   PLT 241 08/07/2019    Lab Results  Component Value Date   CREATININE 0.63 08/07/2019   BUN 10 08/07/2019   NA 125 (L) 08/07/2019   K 4.5 08/07/2019   CL 99 08/07/2019   CO2 21 (L) 08/07/2019    Lab Results  Component Value Date   ALT 15 08/06/2019   AST 20 08/06/2019   ALKPHOS 46 08/06/2019     Microbiology: Recent Results (from the past 240 hour(s))  SARS CORONAVIRUS 2 (TAT 6-24 HRS) Nasopharyngeal Nasopharyngeal Swab     Status: None   Collection Time: 07/28/19  8:24 PM   Specimen: Nasopharyngeal Swab  Result Value Ref Range Status   SARS Coronavirus 2 NEGATIVE NEGATIVE Final    Comment: (NOTE) SARS-CoV-2 target nucleic acids are NOT DETECTED. The SARS-CoV-2 RNA is generally detectable in upper and lower respiratory specimens during the acute phase of infection. Negative results do not preclude SARS-CoV-2 infection, do not rule out co-infections with other pathogens, and should not be used as the sole basis for treatment or other patient management decisions. Negative results must be combined with clinical observations, patient history, and epidemiological information. The expected result is Negative. Fact Sheet for Patients: HairSlick.no  Fact Sheet for Healthcare Providers: quierodirigir.com This test is not yet approved or cleared by the [place] FDA and  has been authorized for detection and/or diagnosis of SARS-CoV-2 by FDA under an Emergency Use Authorization (EUA). This EUA will remain  in effect (meaning this test can be used) for the duration of the COVID-19 declaration under Section 56 4(b)(1) of the Act, 21 U.S.C. section 360bbb-3(b)(1), unless the authorization is terminated or revoked sooner. Performed at [facility], [address], [place], [place] [address]   CSF culture with Stat gram stain     Status: None   Collection Time: 07/30/19  1:46 PM   Specimen: CSF; Cerebrospinal Fluid  Result Value Ref Range Status   Specimen Description CSF  Final   Special Requests NONE  Final   Gram Stain   Final    WBC PRESENT, PREDOMINANTLY MONONUCLEAR NO ORGANISMS SEEN CYTOSPIN SMEAR    Culture   Final    NO GROWTH 3 DAYS Performed at [facility], [address] [place], [place] [address]    Report Status 08/02/2019 FINAL  Final  Acid Fast Smear (AFB)     Status: None   Collection Time: 07/30/19  1:46 PM  Result Value Ref Range Status   AFB Specimen Processing Concentration  Final   Acid Fast Smear Negative  Final    Comment: (NOTE) Performed At: [facility] [address] [place],  [place] [id number] Rush Farmer MD UT:[id number]    Source (AFB) CSF  Final    Comment: Performed at [facility], [place] [address], [place],  [address]  MTB RIF NAA Non-Sputum, w/o Culture     Status: None (Preliminary result)   Collection Time: 07/31/19 10:30 AM   Specimen: CSF; Cerebrospinal Fluid  Result Value Ref Range Status   Source PENDING  Incomplete   Specimen Source Comment  Final    Comment: Cerebrospinal fluid (CSF)   M tuberculosis complex Comment NOT DETECTED Final    Comment: (NOTE) Mycobacterium tuberculosis complex (MTBC) NOT detected.  This test was developed and its performance characteristics determined by [facility]. It has not been cleared or approved by the Food and Drug Administration. Per the College of American Pathologists (CAP) guidelines, a culture must be performed on all samples regardless of the molecular test result. By ordering this test code, the client has assumed responsibility for the performance of the culture.    Rifampin Comment Susceptible Final    Comment: (NOTE) Because Mycobacterium tuberculosis complex (MTBC) was not detected, no rifampin determination is possible. This test was developed and its performance characteristics determined by [facility]. It has not been cleared or approved by the Food and Drug Administration.    AFB Specimen Processing Direct Inoculation  Final    Comment: (NOTE) Performed At: [facility] [place], [place] [id number] Rush Farmer MD BS:[id number]     Impression/Plan:  1. CNS lesions.  Improving on empiric therapy and with a negative MTB probe, improvement radiographically, positive VDRL and positive CMV, that is the likely diagnosis.   Will continue penicillin for 14 days Will continue with ganciclovir for two weeks (induction) Will stop RIPE  2.  Optic neurititis, retinitis - mainly concern as above between syphilis and CMV.  On treatment for both.   3.  HIV/AIDS - at this point, no Tb, toxo unlikely with significant radiographic improvement so I will start [place] which will help with his overall prognosis.    4.  OI risk - will continue with Bactrim for PJP prophylaxis.    5. Leukopenia - in the setting of AIDs but concern for toxic effect of ganciclovir.  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   ADDENDUM: I re-read the CMV CSF test results.  I initially read it as [phone] as the result but that is actually the test order number.  The PCR is actually negative.  I am going  to stop the ganciclovir.

## 2019-08-07 NOTE — Progress Notes (Signed)
PROGRESS NOTE    Arthur [name]  YKZ:[id number] DOB: July 04, 1981 DOA: 07/28/2019 PCP: Randall Hiss, MD    Brief Narrative:  Arthur [name] a 38 y.o.malewith medical history significant of untreatedHIV, anemia, depression, GERD, substance abusepresented to ED on 07/28/2019 with a complaint of dizziness, weakness, intermittent headache. He has not taken any of his HIV medications for about 1 year due to affordability issue. MRI brain done in ED showed Widespread areas of abnormal low level restricted diffusion and postcontrast enhancement throughout both cerebral hemispheres, predominantly infratentorial, with the dominant abnormality in the LEFT anterior frontal white matter consistent with an opportunistic infection, such as toxoplasmosis, tuberculosis or fungal cerebritis/meningitis, or parasitic infection.  Patient was admitted to hospital service and ID was consulted. He then revealed to [place] that he has lost his vision in his right eye. ID started him on penicillin and ganciclovir for possible CMV retinitis versus synovitis retinitis and ophthalmology also saw him.  07/30/2019-status post lumbar puncture by neurology.  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 intake,mild hypotensive.    Consultants:   Neurology, infectious disease, palliative care  Procedures: Lumbar puncture  Antimicrobials:  ethambutol, isoniazid, rifampicin and pyridoxine, pyrazinamide for tuberculosis High-dose penicillin for neurosyphilis Ganciclovir for HSV encephalitis Bactrim for prophylaxis    Subjective: Patient seen and examined personally.  Sitting in bed sitter at bedside.  Appears confused unable to participate with exam or answer my questions  Objective: Vitals:   08/07/19 1720 08/07/19 1842  08/07/19 1857 08/07/19 1920  BP: 101/76 101/73  109/79  Pulse:   67 70  Resp:    (!) 21  Temp:  (!) 97.3 F (36.3 C)    TempSrc:  Axillary    SpO2:  96% 94% 96%  Weight:      Height:        Intake/Output Summary (Last 24 hours) at 08/07/2019 1932 Last data filed at 08/07/2019 1843 Gross per 24 hour  Intake 3276.38 ml  Output 2850 ml  Net 426.38 ml   Filed Weights   07/29/19 0753 07/30/19 0546 08/02/19 0615  Weight: 57 kg 57.6 kg 55.1 kg    Examination:  General exam: NAD, appears confused somnolent not participating with full exam Respiratory system: Clear to auscultation with poor respiratory effort no wheeze or crackles Cardiovascular system: S1 & S2 heard, RRR.  No gallops or rubs or murmurs Gastrointestinal system: Abdomen is nondistended, soft and nontender. Normal bowel sounds heard. Central nervous system: Awake appears somnolent and confused Extremities: No edema Skin: Warm dry     Data Reviewed: I have personally reviewed following labs and imaging studies  CBC: Recent Labs  Lab 08/03/19 0258 08/04/19 0332 08/05/19 0232 08/06/19 0704 08/07/19 0447  WBC 3.2* 3.8* 2.7* 2.0* 2.3*  HGB 13.2 12.7* 14.3 11.8* 12.1*  HCT 37.8* 36.9* 40.5 33.6* 34.5*  MCV 86.3 86.8 85.1 85.9 86.7  PLT 278 265 283 223 241   Basic Metabolic Panel: Recent Labs  Lab 08/03/19 0258 08/04/19 0332  08/05/19 0232 08/05/19 0703 08/05/19 1124 08/06/19 0321 08/06/19 1444 08/06/19 1821 08/07/19 0447 08/07/19 1652  NA 132* 128*   < > 127* 128* 130* 128*  --   --  125*  --   K 4.6 4.6  --  4.3  --   --  3.7  --   --  4.5  --  CL 105 100  --  98  --   --  100  --   --  99  --   CO2 21* 23  --  22  --   --  21*  --   --  21*  --   GLUCOSE 114* 123*  --  92  --   --  97  --   --  92  --   BUN 9 9  --  8  --   --  7  --   --  10  --   CREATININE 0.81 0.72  --  0.70  --   --  0.63  --   --  0.63  --   CALCIUM 9.3 8.9  --  9.2  --   --  8.8*  --   --  8.6*  --   MG  --  1.7  --    --   --   --   --  1.6* 1.6* 1.7 1.8  PHOS  --  3.1  --   --   --   --   --  2.7 3.5 2.9 3.1   < > = values in this interval not displayed.   GFR: Estimated Creatinine Clearance: 99.5 mL/min (by C-G formula based on SCr of 0.63 mg/dL). Liver Function Tests: Recent Labs  Lab 08/02/19 0335 08/03/19 0258 08/04/19 0332 08/05/19 0232 08/06/19 0321  AST 23 22 21 26 20   ALT 14 17 14 16 15   ALKPHOS 44 47 46 53 46  BILITOT 0.2* 0.6 0.6 0.5 0.6  PROT 8.3* 8.4* 7.9 8.8* 7.4  ALBUMIN 2.6* 2.9* 2.7* 3.0* 2.5*   No results for input(s): LIPASE, AMYLASE in the last 168 hours. No results for input(s): AMMONIA in the last 168 hours. Coagulation Profile: No results for input(s): INR, PROTIME in the last 168 hours. Cardiac Enzymes: No results for input(s): CKTOTAL, CKMB, CKMBINDEX, TROPONINI in the last 168 hours. BNP (last 3 results) No results for input(s): PROBNP in the last 8760 hours. HbA1C: No results for input(s): HGBA1C in the last 72 hours. CBG: Recent Labs  Lab 08/07/19 0621 08/07/19 0749 08/07/19 1148 08/07/19 1532 08/07/19 1926  GLUCAP 99 101* 112* 130* 106*   Lipid Profile: No results for input(s): CHOL, HDL, LDLCALC, TRIG, CHOLHDL, LDLDIRECT in the last 72 hours. Thyroid Function Tests: No results for input(s): TSH, T4TOTAL, FREET4, T3FREE, THYROIDAB in the last 72 hours. Anemia Panel: No results for input(s): VITAMINB12, FOLATE, FERRITIN, TIBC, IRON, RETICCTPCT in the last 72 hours. Sepsis Labs: No results for input(s): PROCALCITON, LATICACIDVEN in the last 168 hours.  Recent Results (from the past 240 hour(s))  SARS CORONAVIRUS 2 (TAT 6-24 HRS) Nasopharyngeal Nasopharyngeal Swab     Status: None   Collection Time: 07/28/19  8:24 PM   Specimen: Nasopharyngeal Swab  Result Value Ref Range Status   SARS Coronavirus 2 NEGATIVE NEGATIVE Final    Comment: (NOTE) SARS-CoV-2 target nucleic acids are NOT DETECTED. The SARS-CoV-2 RNA is generally detectable in upper and  lower respiratory specimens during the acute phase of infection. Negative results do not preclude SARS-CoV-2 infection, do not rule out co-infections with other pathogens, and should not be used as the sole basis for treatment or other patient management decisions. Negative results must be combined with clinical observations, patient history, and epidemiological information. The expected result is Negative. Fact Sheet for Patients: SugarRoll.be Fact Sheet for Healthcare Providers: https://www.woods-mathews.com/ This test is not yet approved or  cleared by the [place] and  has been authorized for detection and/or diagnosis of SARS-CoV-2 by FDA under an Emergency Use Authorization (EUA). This EUA will remain  in effect (meaning this test can be used) for the duration of the COVID-19 declaration under Section 56 4(b)(1) of the Act, 21 U.S.C. section 360bbb-3(b)(1), unless the authorization is terminated or revoked sooner. Performed at [facility], [address] [place], [place] [address]   CSF culture with Stat gram stain     Status: None   Collection Time: 07/30/19  1:46 PM   Specimen: CSF; Cerebrospinal Fluid  Result Value Ref Range Status   Specimen Description CSF  Final   Special Requests NONE  Final   Gram Stain   Final    WBC PRESENT, PREDOMINANTLY MONONUCLEAR NO ORGANISMS SEEN CYTOSPIN SMEAR    Culture   Final    NO GROWTH 3 DAYS Performed at [facility], [address], [place], [place] [address]    Report Status 08/02/2019 FINAL  Final  Acid Fast Smear (AFB)     Status: None   Collection Time: 07/30/19  1:46 PM  Result Value Ref Range Status   AFB Specimen Processing Concentration  Final   Acid Fast Smear Negative  Final    Comment: (NOTE) Performed At: [facility] [address] [place], [place] [id number] Jolene Schimke MD WG:[id number]    Source (AFB) CSF  Final    Comment: Performed  at [facility], [address] [place], [place] [address]  Georgana Curio, PCR     Status: Abnormal   Collection Time: 07/30/19  2:08 PM   Specimen: Cerebrospinal Fluid  Result Value Ref Range Status   Toxoplasma Gondii, PCR Positive (A) Negative Final    Comment: (NOTE) Toxoplasma gondii DNA Detected. This test was developed and its performance characteristics determined by [employer]. It has not been cleared or approved by the U.S. Food and Drug Administration. The FDA has determined that such clearance or approval is not necessary. This test is used for clinical purposes. It should not be regarded as investigational or research. Performed At: [facility] [address] [place], [place] [id number] Jolene Schimke MD KG:[id number]   MTB RIF NAA Non-Sputum, w/o Culture     Status: None (Preliminary result)   Collection Time: 07/31/19 10:30 AM   Specimen: CSF; Cerebrospinal Fluid  Result Value Ref Range Status   Source PENDING  Incomplete   Specimen Source Comment  Final    Comment: Cerebrospinal fluid (CSF)   M tuberculosis complex Comment NOT DETECTED Final    Comment: (NOTE) Mycobacterium tuberculosis complex (MTBC) NOT detected. This test was developed and its performance characteristics determined by [facility]. It has not been cleared or approved by the Food and Drug Administration. Per the College of American Pathologists (CAP) guidelines, a culture must be performed on all samples regardless of the molecular test result. By ordering this test code, the client has assumed responsibility for the performance of the culture.    Rifampin Comment Susceptible Final    Comment: (NOTE) Because Mycobacterium tuberculosis complex (MTBC) was not detected, no rifampin determination is possible. This test was developed and its performance characteristics determined by [facility]. It has not been cleared or approved by the Food and Drug Administration.     AFB Specimen Processing Direct Inoculation  Final    Comment: (NOTE) Performed At: [facility] [address] [place], [place] [id number] Jolene Schimke MD VZ:[id number]  Radiology Studies: No results found.      Scheduled Meds:  [START ON 08/08/2019] bictegravir-emtricitabine-tenofovir AF  1 tablet Oral Daily   enoxaparin (LOVENOX) injection  40 mg Subcutaneous Q24H   feeding supplement (OSMOLITE 1.5 CAL)  1,000 mL Per Tube Q24H   feeding supplement (PRO-STAT SUGAR FREE 64)  30 mL Per Tube BID   magic mouthwash w/lidocaine  10 mL Oral TID AC & HS   mouth rinse  15 mL Mouth Rinse BID   multivitamin with minerals  1 tablet Per Tube Daily   sodium chloride flush  3 mL Intravenous Once   sodium chloride flush  3 mL Intravenous Q12H   sulfamethoxazole-trimethoprim  1 tablet Per Tube Daily   Continuous Infusions:  sodium chloride 10 mL/hr at 07/29/19 0742   chlorproMAZINE (THORAZINE) IV 12.5 mg (08/07/19 1148)   dexamethasone (DECADRON) IVPB ([facility]) 20 mg (08/07/19 1139)   fluconazole (DIFLUCAN) IV 200 mg (08/07/19 1510)   penicillin g continuous IV infusion 12 Million Units (08/07/19 1508)    Assessment & Plan:   Principal Problem:   Vision loss Active Problems:   Late syphilis   Acquired immunodeficiency syndrome (HCC)   Bradycardia   Hyponatremia   HIV (human immunodeficiency virus infection) (HCC)   Abnormal brain MRI   Palliative care encounter   Bacterial meningitis   Thrush of mouth and esophagus (HCC)    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 Using as needed Haldol and bedside sitter for safety and fall risk. Currently not on antiretrovirals pending clinical improvement. Repeat MRI brain and orbits show improvement of brain lesions and optic neurititis, retinitis.    Hypovolemic hyponatremia TSH is within normal range Na level down to 125-at this point will hold IV fluids .  Continue monitoring level if continues to not improve will consult nephrology  Leukopenia/immunocompromised in the setting of untreated HIV WBC 2.7>> 2.3 Continue to closely monitor  Untreated HIV HIV treatment on hold Defer treatment to infectious disease  Hypomagnesemia Replaced and stable  Acute metabolic encephalopathy: Due to #1. As needed Ativan and Haldol.  Appreciate neurology input.  Dysphagia Seen by speech therapist N.p.o. Cortrack team consulted for placement Dietitian for PEG tube feeding  Severe protein calorie malnutrition Albumin 2.7 with BMI 16 Dietitian consulted Plan for cortrack tube placement on 08/06/2019.  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 Treating as above.  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 Seen by palliative care. Currently recommending continuing aggressive measures.   DVT prophylaxis:Lovenox subcu daily Code Status:Full code Family Communication:None Disposition Plan: Plan to DC when infectious disease signs off and medically stable.        LOS: 9 days   Time spent: 45 minutes with more than 50% on COC    Lynn ItoSahar Kaimana Lurz, MD [facility] Pager [phone]  If 7PM-7AM, please contact night-coverage [email or web address] Password [email or web address] 08/07/2019, 7:32 PM

## 2019-08-07 NOTE — Progress Notes (Signed)
Dear Doctor: This patient has been identified as a candidate for PICC line for the following reason (s): Multiple PIV restarts. 15 PIVs in 10 days. Infiltrations and patient pulling PIVs out. Recommend PICC line to be sutured. If you agree, please write an order for the indicated device.   Thank you for supporting the early vascular access assessment program.

## 2019-08-08 LAB — CBC
HCT: 38.1 % — ABNORMAL LOW (ref 39.0–52.0)
Hemoglobin: 13.3 g/dL (ref 13.0–17.0)
MCH: 29.7 pg (ref 26.0–34.0)
MCHC: 34.9 g/dL (ref 30.0–36.0)
MCV: 85 fL (ref 80.0–100.0)
Platelets: 268 10*3/uL (ref 150–400)
RBC: 4.48 MIL/uL (ref 4.22–5.81)
RDW: 13.3 % (ref 11.5–15.5)
WBC: 2.1 10*3/uL — ABNORMAL LOW (ref 4.0–10.5)
nRBC: 0 % (ref 0.0–0.2)

## 2019-08-08 LAB — BASIC METABOLIC PANEL
Anion gap: 7 (ref 5–15)
BUN: 14 mg/dL (ref 6–20)
CO2: 23 mmol/L (ref 22–32)
Calcium: 9.2 mg/dL (ref 8.9–10.3)
Chloride: 94 mmol/L — ABNORMAL LOW (ref 98–111)
Creatinine, Ser: 0.74 mg/dL (ref 0.61–1.24)
GFR calc Af Amer: >60 mL/min (ref >60)
GFR calc non Af Amer: >60 mL/min (ref >60)
Glucose, Bld: 111 mg/dL — ABNORMAL HIGH (ref 70–99)
Potassium: 4.7 mmol/L (ref 3.5–5.1)
Sodium: 124 mmol/L — ABNORMAL LOW (ref 135–145)

## 2019-08-08 LAB — GLUCOSE, CAPILLARY
Glucose-Capillary: 106 mg/dL — ABNORMAL HIGH (ref 70–99)
Glucose-Capillary: 113 mg/dL — ABNORMAL HIGH (ref 70–99)
Glucose-Capillary: 116 mg/dL — ABNORMAL HIGH (ref 70–99)
Glucose-Capillary: 124 mg/dL — ABNORMAL HIGH (ref 70–99)
Glucose-Capillary: 137 mg/dL — ABNORMAL HIGH (ref 70–99)
Glucose-Capillary: 155 mg/dL — ABNORMAL HIGH (ref 70–99)
Glucose-Capillary: 99 mg/dL (ref 70–99)

## 2019-08-08 LAB — CORTISOL: Cortisol, Plasma: 3.1 ug/dL

## 2019-08-08 LAB — LACTIC ACID, PLASMA
Lactic Acid, Venous: 0.6 mmol/L (ref 0.5–1.9)
Lactic Acid, Venous: 1.1 mmol/L (ref 0.5–1.9)

## 2019-08-08 LAB — TSH: TSH: 0.792 u[IU]/mL (ref 0.350–4.500)

## 2019-08-08 LAB — OSMOLALITY: Osmolality: 263 mOsm/kg — ABNORMAL LOW (ref 275–295)

## 2019-08-08 MED ORDER — PENICILLIN G BENZATHINE 1200000 UNIT/2ML IM SUSP
2.4000 10*6.[IU] | Freq: Once | INTRAMUSCULAR | Status: AC
  Administered 2019-08-13: 2.4 10*6.[IU] via INTRAMUSCULAR
  Filled 2019-08-08: qty 4

## 2019-08-08 MED ORDER — SODIUM CHLORIDE 0.9 % IV SOLN
10.0000 mg | Freq: Once | INTRAVENOUS | Status: AC
  Administered 2019-08-09: 10 mg via INTRAVENOUS
  Filled 2019-08-08: qty 1

## 2019-08-08 MED ORDER — SODIUM CHLORIDE 0.9 % IV SOLN
15.0000 mg | Freq: Once | INTRAVENOUS | Status: AC
  Administered 2019-08-08: 15 mg via INTRAVENOUS
  Filled 2019-08-08: qty 1.5

## 2019-08-08 NOTE — Progress Notes (Signed)
Oral temp now 98.3 after using warm blankets and increasing room temperature.  Jodell Cipro

## 2019-08-08 NOTE — Progress Notes (Signed)
Occupational Therapy Treatment Patient Details Name: Arthur [name] MRN: [id number] DOB: [birth year] Today's Date: 08/08/2019    History of present illness Arthur [name] is a 38 y.o. male with medical history significant of untreated HIV, anemia, depression, GERD, substance abuse presenting with c/o dizziness, headache and weakness.  He was admitted with hyponatremia, bradycardia and late syphilis.   OT comments  Pt making minimal progress towards OT goals this session.Pt required MOD- MAX A +2 for sitting balance with heavy posterior lean. Pt sit>stand x2 with RW and with +2 hand held assist. Pt required MAX A +2 for sit>stands. Pt able to take 2 steps forward and 2 steps backwards this session but needed MAX A to advance RLE. Pt required MAX A for seated UB ADLs; hand over hand assist needed to initiate oral care from EOB. DC plan remains appropriate, will continue to follow acutely per POC.    Follow Up Recommendations  SNF;Home health OT;Supervision/Assistance - 24 hour    Equipment Recommendations  3 in 1 bedside commode    Recommendations for Other Services      Precautions / Restrictions Precautions Precautions: Fall Precaution Comments: watch BP, HR Restrictions Weight Bearing Restrictions: No       Mobility Bed Mobility Overal bed mobility: Needs Assistance Bed Mobility: Supine to Sit;Sit to Supine     Supine to sit: +2 for physical assistance;Total assist Sit to supine: Total assist;+2 for physical assistance   General bed mobility comments: Assist to bring legs off of bed, elevate trunk into sitting and bring hips to EOB. Assist to lower trunk and bring legs back up into bed.   Transfers Overall transfer level: Needs assistance Equipment used: Rolling walker (2 wheeled);2 person hand held assist Transfers: Sit to/from Stand Sit to Stand: Max assist;+2 physical assistance         General transfer comment: Assist to bring hips up and for balance. Pt with  heavy posterior lean. Assist to flex hips to transition for standing to sitting; MAX A to WB thru RLE    Balance Overall balance assessment: Needs assistance Sitting-balance support: Feet supported;Bilateral upper extremity supported Sitting balance-Leahy Scale: Poor Sitting balance - Comments: Mod to max assist for sitting EOB except brief min assist when having pt reach forward.  Postural control: Posterior lean Standing balance support: Bilateral upper extremity supported Standing balance-Leahy Scale: Poor Standing balance comment: Stood x 1 with walker and x1 with bilateral hand held. +2 mod assist with support of trunk and hips and blocking of rt. knee.                            ADL either performed or assessed with clinical judgement   ADL Overall ADL's : Needs assistance/impaired     Grooming: Oral care;Sitting;Maximal assistance;Wash/dry face;Set up Grooming Details (indicate cue type and reason): pt required MAX A and hand over hand assist to complete tasks, pt able to wipe face with set- up asssit                   Toilet Transfer Details (indicate cue type and reason): unable to attempt transfer d/t heavy posterior lean, pt able to make small steps up forward but required MAX A to advance RLE, MOD A +2 for standing balance with RW         Functional mobility during ADLs: Moderate assistance;+2 for physical assistance;+2 for safety/equipment;Rolling walker General ADL Comments: sesssion focus on sitting balance,  functional sit>stands and seated UB ADLs, pt continues to be limited by generalized weakness     Vision Patient Visual Report: Other (comment)(pt reports no vision in R eye) Vision Assessment?: Vision impaired- to be further tested in functional context   Perception     Praxis      Cognition Arousal/Alertness: Awake/alert Behavior During Therapy: Flat affect Overall Cognitive Status: Difficult to assess Area of Impairment: Problem  solving;Awareness;Safety/judgement;Following commands                       Following Commands: Follows one step commands inconsistently;Follows one step commands with increased time Safety/Judgement: Decreased awareness of deficits;Decreased awareness of safety Awareness: Intellectual Problem Solving: Slow processing;Requires verbal cues;Requires tactile cues;Decreased initiation General Comments: decreased safety awareness and insight to deficits        Exercises     Shoulder Instructions       General Comments      Pertinent Vitals/ Pain       Pain Assessment: Faces Faces Pain Scale: No hurt  Home Living                                          Prior Functioning/Environment              Frequency  Min 2X/week        Progress Toward Goals  OT Goals(current goals can now be found in the care plan section)  Progress towards OT goals: Progressing toward goals  Acute Rehab OT Goals Patient Stated Goal: get up to walk OT Goal Formulation: With patient Time For Goal Achievement: 08/13/19 Potential to Achieve Goals: Good  Plan Discharge plan remains appropriate    Co-evaluation    PT/OT/SLP Co-Evaluation/Treatment: Yes Reason for Co-Treatment: For patient/therapist safety;To address functional/ADL transfers   OT goals addressed during session: ADL's and self-care      AM-PAC OT "6 Clicks" Daily Activity     Outcome Measure   Help from another person eating meals?: A Lot Help from another person taking care of personal grooming?: A Lot Help from another person toileting, which includes using toliet, bedpan, or urinal?: Total Help from another person bathing (including washing, rinsing, drying)?: A Lot Help from another person to put on and taking off regular upper body clothing?: A Lot Help from another person to put on and taking off regular lower body clothing?: Total 6 Click Score: 10    End of Session Equipment Utilized  During Treatment: Rolling walker;Gait belt  OT Visit Diagnosis: Muscle weakness (generalized) (M62.81)   Activity Tolerance Patient tolerated treatment well   Patient Left in bed;with call bell/phone within reach;with nursing/sitter in room   Nurse Communication Mobility status        Time: 0254-2706 OT Time Calculation (min): 30 min  Charges: OT General Charges $OT Visit: 1 Visit OT Treatments $Therapeutic Activity: 8-22 mins  Audery Amel., COTA/L Acute Rehabilitation Services [phone]    Angelina Pih 08/08/2019, 2:29 PM

## 2019-08-08 NOTE — Progress Notes (Signed)
Physical Therapy Treatment Patient Details Name: Arthur [name] MRN: [id number] DOB: 1981/03/15 Today's Date: 08/08/2019    History of Present Illness Arthur [name] is a 38 y.o. male with medical history significant of untreated HIV, anemia, depression, GERD, substance abuse presenting with c/o dizziness, headache and weakness.  He was admitted with hyponatremia, bradycardia and late syphilis.    PT Comments    Pt continues to have little improvement if not worsening of functional mobility.    Follow Up Recommendations  SNF;Supervision/Assistance - 24 hour     Equipment Recommendations  Other (comment)(To be determined at next venue)    Recommendations for Other Services       Precautions / Restrictions Precautions Precautions: Fall Precaution Comments: watch BP, HR Restrictions Weight Bearing Restrictions: No    Mobility  Bed Mobility Overal bed mobility: Needs Assistance Bed Mobility: Supine to Sit;Sit to Supine     Supine to sit: +2 for physical assistance;Total assist Sit to supine: Total assist;+2 for physical assistance   General bed mobility comments: Assist to bring legs off of bed, elevate trunk into sitting and bring hips to EOB. Assist to lower trunk and bring legs back up into bed.   Transfers Overall transfer level: Needs assistance Equipment used: Rolling walker (2 wheeled);2 person hand held assist Transfers: Sit to/from Stand Sit to Stand: Max assist;+2 physical assistance         General transfer comment: Assist to bring hips up and for balance. Pt with heavy posterior lean. Assist to flex hips to transition for standing to sitting  Ambulation/Gait Ambulation/Gait assistance: +2 physical assistance;Total assist Gait Distance (Feet): 1 Feet Assistive device: 2 person hand held assist Gait Pattern/deviations: Step-to pattern;Decreased step length - right;Decreased step length - left;Narrow base of support Gait velocity: decr Gait velocity  interpretation: <1.31 ft/sec, indicative of household ambulator General Gait Details: Heavy assist for trunk control and support. Total assist to advance and place RLE as well as assist to block rt knee. Forward 1' and then backward 1'   Stairs             Wheelchair Mobility    Modified Rankin (Stroke Patients Only)       Balance Overall balance assessment: Needs assistance Sitting-balance support: Feet supported;Bilateral upper extremity supported Sitting balance-Leahy Scale: Poor Sitting balance - Comments: Mod to max assist for sitting EOB except brief min assist when having pt reach forward.  Postural control: Posterior lean Standing balance support: Bilateral upper extremity supported Standing balance-Leahy Scale: Poor Standing balance comment: Stood x 1 with walker and x1 with bilateral hand held. +2 mod assist with support of trunk and hips and blocking of rt. knee.                             Cognition Arousal/Alertness: Awake/alert Behavior During Therapy: Flat affect Overall Cognitive Status: Difficult to assess Area of Impairment: Problem solving;Awareness;Safety/judgement;Following commands                       Following Commands: Follows one step commands inconsistently Safety/Judgement: Decreased awareness of deficits;Decreased awareness of safety Awareness: Intellectual Problem Solving: Slow processing;Requires verbal cues;Requires tactile cues;Decreased initiation        Exercises      General Comments        Pertinent Vitals/Pain Pain Assessment: Faces Faces Pain Scale: No hurt    Home Living  Prior Function            PT Goals (current goals can now be found in the care plan section) Progress towards PT goals: Not progressing toward goals - comment    Frequency    Min 2X/week      PT Plan Current plan remains appropriate    Co-evaluation PT/OT/SLP Co-Evaluation/Treatment:  Yes Reason for Co-Treatment: Complexity of the patient's impairments (multi-system involvement);For patient/therapist safety          AM-PAC PT "6 Clicks" Mobility   Outcome Measure  Help needed turning from your back to your side while in a flat bed without using bedrails?: Total Help needed moving from lying on your back to sitting on the side of a flat bed without using bedrails?: Total Help needed moving to and from a bed to a chair (including a wheelchair)?: Total Help needed standing up from a chair using your arms (e.g., wheelchair or bedside chair)?: Total Help needed to walk in hospital room?: Total Help needed climbing 3-5 steps with a railing? : Total 6 Click Score: 6    End of Session         PT Visit Diagnosis: Other abnormalities of gait and mobility (R26.89);Muscle weakness (generalized) (M62.81)     Time: 4259-5638 PT Time Calculation (min) (ACUTE ONLY): 30 min  Charges:  $Therapeutic Activity: 8-22 mins                     [facility] PT Acute Rehabilitation Services Pager [phone] Office [phone]    Angelina Ok [facility] 08/08/2019, 2:07 PM

## 2019-08-08 NOTE — Progress Notes (Signed)
  Speech Language Pathology Treatment: Dysphagia  Patient Details Name: Arthur [name] MRN: [id number] DOB: [birth year] Today's Date: 08/08/2019 Time: 5035-4656 SLP Time Calculation (min) (ACUTE ONLY): 12 min  Assessment / Plan / Recommendation Clinical Impression  Pt seen for ongoing swallowing therapy.  Pt awake/alert with mittens on hands.  RN had just completed oral care and noted that she had been having to use suction for copious secretions.  Pt was unable to orally transit PO trials today.  Some lingual movement was noted, but no pharyngeal swallow was palpable with ice chip, thin liquid, or puree trials. Suction was used to remove all PO trials.  Pt declined further trials and became tearful at end of session.  SLP will continue to follow for PO readiness.  Recommend pt remain NPO with alternate means of nutrition, hydration, and medication.    HPI HPI: Arthur [name] is a 38 y.o. male with medical history significant of untreated HIV, anemia, depression, GERD, substance abuse.  He presented with worsening dizziness/lightheadedness, intermittent headaches but no nausea/vomiting or diarrhea but some decreased p.o. intake.  He had no fevers, chest pain or shortness of breath.  Patient states that he has not been able to afford his HIV medications and has not been taking them in a year.  Set up a follow-up appointment with his ID doctor.  In the past week he has been seen in the emergency department 3 times for various complaints.  Most recent chest x-ray was unremarkable.  MRI of the brain was showing Widespread areas of abnormal low level restricted diffusion and postcontrast enhancement throughout both cerebral hemispheres,  predominantly infratentorial, with the dominant abnormality in the LEFT anterior frontal white matter. In this immunocompromised patient, the findings are most consistent with an opportunistic infection, such as toxoplasmosis, tuberculosis or fungal cerebritis/meningitis,  or parasitic infection. Infectious disease consultation is warranted.  Generalized atrophy with mild small vessel disease elsewhere,  premature for age but not clearly acute.  No midline shift or acute hydrocephalus.      SLP Plan  Continue with current plan of care       Recommendations  Diet recommendations: NPO                Oral Care Recommendations: Oral care QID;Staff/trained caregiver to provide oral care Follow up Recommendations: 24 hour supervision/assistance SLP Visit Diagnosis: Dysphagia, unspecified (R13.10) Plan: Continue with current plan of care       [place], [place], [place] Office: [phone] 08/08/2019, 10:17 AM

## 2019-08-08 NOTE — Progress Notes (Signed)
PROGRESS NOTE    Arthur ParrMichael J [name]  JYN:[id number] DOB: 05/29/1981 DOA: 07/28/2019 PCP: Randall HissVan Dam, Cornelius N, MD    Brief Narrative:  depression, GERD, substance abusepresented to ED on 07/28/2019 with a complaint of dizziness, weakness, intermittent headache. He has not taken any of his HIV medications for about 1 year due to affordability issue. MRI brain done in ED showed Widespread areas of abnormal low level restricted diffusion and postcontrast enhancement throughout both cerebral hemispheres, predominantly infratentorial, with the dominant abnormality in the LEFT anterior frontal white matter consistent with an opportunistic infection, such as toxoplasmosis, tuberculosis or fungal cerebritis/meningitis, or parasitic infection.  Patient was admitted to hospital service and ID was consulted. He then revealed to us that he has lost his vision in his right eye. ID started him on penicillin and ganciclovir for possible CMV retinitis versus synovitis retinitis and ophthalmology also saw him.  07/30/2019-status post lumbar puncture by neurology.  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 intake,mild hypotensive.    Consultants:   Neurology, infectious disease, palliative care  Procedures: Lumbar puncture  Antimicrobials:  ethambutol, isoniazid, rifampicin and pyridoxine, pyrazinamide for tuberculosis High-dose penicillin for neurosyphilis Ganciclovir for HSV encephalitis Bactrim for prophylaxis    Subjective: Patient seen and examined.  Per nursing patient remains confused.  When I call patient's name he does open his for a few seconds.  When I ask whether he has shortness of breath, fever, chills, pain he shakes his head no.  Objective: Vitals:   08/08/19 0400 08/08/19 0407 08/08/19 0453  08/08/19 0756  BP: 110/77 110/77    Pulse: 93 84    Resp: (!) 21 (!) 27 20   Temp:  98.3 F (36.8 C)  97.8 F (36.6 C)  TempSrc:  Oral  Oral  SpO2: 92% 94%    Weight:  57.7 kg    Height:        Intake/Output Summary (Last 24 hours) at 08/08/2019 1023 Last data filed at 08/08/2019 [id number] Gross per 24 hour  Intake 2967.26 ml  Output 1650 ml  Net 1317.26 ml   Filed Weights   07/30/19 0546 08/02/19 0615 08/08/19 0407  Weight: 57.6 kg 55.1 kg 57.7 kg    Examination: General exam: NAD, sleepy but responds to my questions by shaking his head, laying in bed  respiratory system: Clear to auscultation with poor respiratory effort.  no wheeze or crackles Cardiovascular system: S1 & S2 heard, RRR.  No gallops or rubs or murmurs Gastrointestinal system: Abdomen is nondistended, soft and nontender. Normal bowel sounds heard. Central nervous system:  Sleepy.  Unable to do full neuro exam. Extremities: No edema Skin: Warm dry     Data Reviewed: I have personally reviewed following labs and imaging studies  CBC: Recent Labs  Lab 08/04/19 0332 08/05/19 0232 08/06/19 0704 08/07/19 0447 08/08/19 0329  WBC 3.8* 2.7* 2.0* 2.3* 2.1*  HGB 12.7* 14.3 11.8* 12.1* 13.3  HCT 36.9* 40.5 33.6* 34.5* 38.1*  MCV 86.8 85.1 85.9 86.7 85.0  PLT 265 283 223 241 268   Basic Metabolic Panel: Recent Labs  Lab 08/04/19 0332  08/05/19 0232 08/05/19 0703 08/05/19 1124 08/06/19 0321 08/06/19 1444 08/06/19 1821 08/07/19 0447 08/07/19 1652 08/08/19 0329  NA 128*   < > 127* 128* 130* 128*  --   --  125*  --  124*  K 4.6  --  4.3  --   --  3.7  --   --  4.5  --  4.7  CL 100  --  98  --   --  100  --   --  99  --  94*  CO2 23  --  22  --   --  21*  --   --  21*  --  23  GLUCOSE 123*  --  92  --   --  97  --   --  92  --  111*  BUN 9  --  8  --   --  7  --   --  10  --  14  CREATININE 0.72  --  0.70  --   --  0.63  --   --  0.63  --  0.74  CALCIUM 8.9  --  9.2  --   --  8.8*  --   --  8.6*   --  9.2  MG 1.7  --   --   --   --   --  1.6* 1.6* 1.7 1.8  --   PHOS 3.1  --   --   --   --   --  2.7 3.5 2.9 3.1  --    < > = values in this interval not displayed.   GFR: Estimated Creatinine Clearance: 104.2 mL/min (by C-G formula based on SCr of 0.74 mg/dL). Liver Function Tests: Recent Labs  Lab 08/02/19 0335 08/03/19 0258 08/04/19 0332 08/05/19 0232 08/06/19 0321  AST 23 22 21 26 20   ALT 14 17 14 16 15   ALKPHOS 44 47 46 53 46  BILITOT 0.2* 0.6 0.6 0.5 0.6  PROT 8.3* 8.4* 7.9 8.8* 7.4  ALBUMIN 2.6* 2.9* 2.7* 3.0* 2.5*   No results for input(s): LIPASE, AMYLASE in the last 168 hours. No results for input(s): AMMONIA in the last 168 hours. Coagulation Profile: No results for input(s): INR, PROTIME in the last 168 hours. Cardiac Enzymes: No results for input(s): CKTOTAL, CKMB, CKMBINDEX, TROPONINI in the last 168 hours. BNP (last 3 results) No results for input(s): PROBNP in the last 8760 hours. HbA1C: No results for input(s): HGBA1C in the last 72 hours. CBG: Recent Labs  Lab 08/07/19 1532 08/07/19 1926 08/08/19 0006 08/08/19 0404 08/08/19 0930  GLUCAP 130* 106* 99 106* 113*   Lipid Profile: No results for input(s): CHOL, HDL, LDLCALC, TRIG, CHOLHDL, LDLDIRECT in the last 72 hours. Thyroid Function Tests: No results for input(s): TSH, T4TOTAL, FREET4, T3FREE, THYROIDAB in the last 72 hours. Anemia Panel: No results for input(s): VITAMINB12, FOLATE, FERRITIN, TIBC, IRON, RETICCTPCT in the last 72 hours. Sepsis Labs: Recent Labs  Lab 08/07/19 2350 08/08/19 0329  LATICACIDVEN 0.6 1.1    Recent Results (from the past 240 hour(s))  CSF culture with Stat gram stain     Status: None   Collection Time: 07/30/19  1:46 PM   Specimen: CSF; Cerebrospinal Fluid  Result Value Ref Range Status   Specimen Description CSF  Final   Special Requests NONE  Final   Gram Stain   Final    WBC PRESENT, PREDOMINANTLY MONONUCLEAR NO ORGANISMS SEEN CYTOSPIN SMEAR     Culture   Final    NO GROWTH 3 DAYS Performed at [facility], [address], [place], [address] [place]    Report Status 08/02/2019 FINAL  Final  Acid Fast Smear (AFB)     Status: None   Collection Time:  07/30/19  1:46 PM  Result Value Ref Range Status   AFB Specimen Processing Concentration  Final   Acid Fast Smear Negative  Final    Comment: (NOTE) Performed At: [facility] [place], [place] [id number] Rush Farmer MD OE:[id number]    Source (AFB) CSF  Final    Comment: Performed at [facility], [place] [address], [place], [place] [address]  Andee Poles, PCR     Status: Abnormal   Collection Time: 07/30/19  2:08 PM   Specimen: Cerebrospinal Fluid  Result Value Ref Range Status   Toxoplasma Gondii, PCR Positive (A) Negative Final    Comment: (NOTE) Toxoplasma gondii DNA Detected. This test was developed and its performance characteristics determined by [employer]. It has not been cleared or approved by the U.S. Food and Drug Administration. The FDA has determined that such clearance or approval is not necessary. This test is used for clinical purposes. It should not be regarded as investigational or research. Performed At: [facility] [place], [place] [id number] Rush Farmer MD TO:[id number]   MTB RIF NAA Non-Sputum, w/o Culture     Status: None (Preliminary result)   Collection Time: 07/31/19 10:30 AM   Specimen: CSF; Cerebrospinal Fluid  Result Value Ref Range Status   Source PENDING  Incomplete   Specimen Source Comment  Final    Comment: Cerebrospinal fluid (CSF)   M tuberculosis complex Comment NOT DETECTED Final    Comment: (NOTE) Mycobacterium tuberculosis complex (MTBC) NOT detected. This test was developed and its performance characteristics determined by [facility]. It has not been cleared or approved by the Food and Drug Administration. Per the College of American Pathologists (CAP)  guidelines, a culture must be performed on all samples regardless of the molecular test result. By ordering this test code, the client has assumed responsibility for the performance of the culture.    Rifampin Comment Susceptible Final    Comment: (NOTE) Because Mycobacterium tuberculosis complex (MTBC) was not detected, no rifampin determination is possible. This test was developed and its performance characteristics determined by [facility]. It has not been cleared or approved by the Food and Drug Administration.    AFB Specimen Processing Direct Inoculation  Final    Comment: (NOTE) Performed At: [facility] [place], [place] [id number] Rush Farmer MD ZJ:[id number]          Radiology Studies: No results found.      Scheduled Meds: . bictegravir-emtricitabine-tenofovir AF  1 tablet Oral Daily  . enoxaparin (LOVENOX) injection  40 mg Subcutaneous Q24H  . feeding supplement (OSMOLITE 1.5 CAL)  1,000 mL Per Tube Q24H  . feeding supplement (PRO-STAT SUGAR FREE 64)  30 mL Per Tube BID  . magic mouthwash w/lidocaine  10 mL Oral TID AC & HS  . mouth rinse  15 mL Mouth Rinse BID  . multivitamin with minerals  1 tablet Per Tube Daily  . [START ON 08/13/2019] penicillin g benzathine (BICILLIN-LA) IM  2.4 Million Units Intramuscular Once  . sodium chloride flush  3 mL Intravenous Once  . sodium chloride flush  3 mL Intravenous Q12H  . sulfamethoxazole-trimethoprim  1 tablet Per Tube Daily   Continuous Infusions: . sodium chloride 250 mL (08/07/19 2207)  . chlorproMAZINE (THORAZINE) IV 12.5 mg (08/07/19 2211)  . [START ON 08/09/2019] dexamethasone (DECADRON) IVPB ([facility])    . dexamethasone (DECADRON) IVPB ([facility])    . fluconazole (DIFLUCAN) IV 200 mg (08/07/19 1510)  . 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 TB gold plus negative.MTB probe negative ID following-recommend continuing penicillin for 14 days  RIPE was stopped   Right eye blindless/Optic retinitis/neurititis mainly concerned between syphilis and CMV-but CMV PCR negative in the CNS.   Received steroid injection 08/02/2019 Received ethambutol, isoniazid, rifampicin and pyridoxine, pyrazinamide for tuberculosis-no d/c Ganciclovir forCMVwas d/c'd now Bactrim for prophylaxis. Using as needed Haldol and bedside sitter for safety and fall risk. Currently not on antiretrovirals pending clinical improvement. Repeat MRIbrain and orbits show improvement of brain lesions and optic neurititis, retinitis. Per ID-continue On High-dose penicillin for neurosyphilis    Hypovolemic hyponatremia TSH is within normal range Na level down to 125-at this point  IV fluids held yesterday .  Sodium continue to dopping Consulted nephrology spoke to Dr. Arrie Aran today. Possibly SIADH, will place on fluid restriction Monitor labs  Leukopenia/immunocompromised in the setting of untreated HIV WBC 2.7>>2.1 Continue to closely monitor  Untreated HIV started the [facility]  ID following   Hypomagnesemia Replaced and stable  Acute metabolic encephalopathy: Due to #1. As needed Ativan and Haldol.  Appreciate neurology input.  Dysphagia Seen by speech therapist N.p.o. [facility] consulted for placement Dietitian for PEG tube feeding  Severe protein calorie malnutrition Albumin 2.7  with BMI 16 Dietitian consulted Planfor cortrack tubeplacement on 08/06/2019.  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 Treating as above.  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 Seen by palliative care. Currently recommending continuing aggressive measures.   DVT prophylaxis:Lovenox subcu daily Code Status:Full code Family Communication:None Disposition Plan:Plan to DC when infectious disease signs off and medically stable.  PT/OT recommend SNF, supervision/assistance 24 hours.         LOS: 10 days   Time spent: 45 minutes with more than 50% on COC    Lynn Ito, MD [facility] Pager [phone]  If 7PM-7AM, please contact night-coverage [email or web address] Password [facility] 08/08/2019, 10:23 AM

## 2019-08-08 NOTE — Progress Notes (Signed)
Patient's temperature was 96.8 (rectal) @ 2235 on 08/07/19, doctor was notified.  MD ordered lactic acid lab to be drawn and warm blankets be applied and replaced every 30 minutes.  No change was noted and at 0100 on 08/08/19 patient's temperature was rechecked rectally and was 96.4. NP notified of temperature recheck.  Lactic acid within normal limits.  No new orders received.   Elaina Hoops, RN

## 2019-08-08 NOTE — Consult Note (Signed)
Reason for Consult: Hyponatremia Referring Physician: Kurtis Bushman, MD  Arthur [name] is an 38 y.o. male.  HPI: Mr. Makki has a PMH significant for untreated HIV/AIDS, anemia of chronic disease, depression, GERD, and substance abuse who presented to [facility] ED on 07/28/19 with worsening dizziness, HA's, vision loss of right eye.  In the ED he was noted to be hypotensive, CD4 of 35, MRI with scattered lesions in his brain, +VDRL, +RPR, +serum toxo IgG, and ophthalmic finding c/w CMV retinitis.  He was admitted for IV ganciclovir, penicillin, rifampin, isoniazid, pyrazinamide, ethambutol, pyridoxine.  He was also started on bactrim for OI prophylaxis.  We were consulted after he developed progressive hyponatremia.  He was started on IV NS as he was thought to be volume depleted, however his sodium initially improved to 134 on 08/02/19 but has been slowly and consistently falling.  We were consulted to further evaluate and manage his hyponatremia.  The trend in serum sodium is seen below.   Trend in Serum Sodium: Sodium  Date/Time Value Ref Range Status  08/08/2019 03:29 AM 124 (L) 135 - 145 mmol/L Final  08/07/2019 04:47 AM 125 (L) 135 - 145 mmol/L Final  08/06/2019 03:21 AM 128 (L) 135 - 145 mmol/L Final  08/05/2019 11:24 AM 130 (L) 135 - 145 mmol/L Final  08/05/2019 07:03 AM 128 (L) 135 - 145 mmol/L Final  08/05/2019 02:32 AM 127 (L) 135 - 145 mmol/L Final  08/04/2019 11:34 PM 126 (L) 135 - 145 mmol/L Final  08/04/2019 06:26 PM 128 (L) 135 - 145 mmol/L Final  08/04/2019 02:39 PM 127 (L) 135 - 145 mmol/L Final  08/04/2019 10:49 AM 127 (L) 135 - 145 mmol/L Final  08/04/2019 03:32 AM 128 (L) 135 - 145 mmol/L Final  08/03/2019 02:58 AM 132 (L) 135 - 145 mmol/L Final  08/02/2019 03:35 AM 134 (L) 135 - 145 mmol/L Final  08/01/2019 03:10 AM 129 (L) 135 - 145 mmol/L Final  07/30/2019 11:13 AM 130 (L) 135 - 145 mmol/L Final  07/29/2019 03:32 AM 134 (L) 135 - 145 mmol/L Final  07/28/2019 10:59 AM 132 (L) 135 -  145 mmol/L Final  07/26/2019 09:15 AM 133 (L) 135 - 145 mmol/L Final  07/22/2019 12:11 PM 131 (L) 135 - 145 mmol/L Final  04/15/2019 05:57 PM 133 (L) 135 - 145 mmol/L Final  03/15/2016 01:25 PM 132 (L) 135 - 145 mmol/L Final  10/29/2013 10:22 AM 139 135 - 145 mEq/L Final  08/21/2013 10:01 AM 136 135 - 145 mEq/L Final  05/30/2013 10:18 AM 134 (L) 135 - 145 mEq/L Final  03/13/2013 09:43 AM 134 (L) 135 - 145 mEq/L Final  02/04/2013 10:58 AM 137 135 - 145 mEq/L Final  08/17/2012 09:27 AM 136 135 - 145 mEq/L Final  01/11/2012 12:09 PM 139 135 - 145 mEq/L Final  09/26/2011 10:41 AM 136 135 - 145 mEq/L Final  10/11/2010 07:55 PM 135 135 - 145 meq/L Final  08/16/2010 09:22 PM 140 135 - 145 meq/L Final  02/25/2010 06:16 PM 136 135 - 145 meq/L Final  06/23/2009 02:45 PM 141 (135-145) meq/L Final  06/19/2009 08:27 PM 143 (135-145) meq/L Final  12/30/2008 09:43 PM 140 135 - 145 meq/L Final  09/10/2008 09:10 PM 139 135 - 145 meq/L Final  05/13/2008 08:21 PM 139 135 - 145 meq/L Final  01/30/2008 05:18 PM 139 135 - 145 meq/L Final  11/02/2007 05:31 PM 140 135 - 145 meq/L Final  08/07/2007 08:16 PM 137 135 - 145 meq/L Final  04/10/2007 09:57 PM 138 135 - 145 meq/L Final   PMH:   Past Medical History:  Diagnosis Date  . Anemia   . Anxiety   . Depression   . GERD (gastroesophageal reflux disease)   . HIV infection (HCC)   . Substance abuse (HCC)     PSH:   Past Surgical History:  Procedure Laterality Date  . TOOTH EXTRACTION      Allergies: No Known Allergies  Medications:   Prior to Admission medications   Medication Sig Start Date End Date Taking? Authorizing Provider  ondansetron (ZOFRAN ODT) 8 MG disintegrating tablet Take 1 tablet (8 mg total) by mouth every 8 (eight) hours as needed for nausea or vomiting. 07/26/19   Margarita Grizzleay, Danielle, MD  ritonavir (NORVIR) 100 MG TABS tablet Take 1 tablet (100 mg total) by mouth daily. Patient not taking: Reported on 01/22/2018 02/06/14   Daiva EvesVan Dam,  Lisette Grinderornelius N, MD  TRUVADA 200-300 MG per tablet TAKE 1 TABLET BY MOUTH DAILY Patient not taking: Reported on 01/22/2018 05/12/14   Daiva EvesVan Dam, Lisette Grinderornelius N, MD    Discontinued Meds:   Medications Discontinued During This Encounter  Medication Reason  . oxyCODONE-acetaminophen (PERCOCET/ROXICET) 5-325 MG tablet Completed Course  . lidocaine (XYLOCAINE) 2 % solution Patient Preference  . ibuprofen (ADVIL,MOTRIN) 800 MG tablet Patient Preference  . HYDROcodone-acetaminophen (NORCO) 5-325 MG tablet Completed Course  . Darunavir Ethanolate (PREZISTA) 800 MG tablet Patient Preference  . cyclobenzaprine (FLEXERIL) 10 MG tablet Patient Preference  . citalopram (CELEXA) 40 MG tablet Patient Preference  . cephALEXin (KEFLEX) 500 MG capsule Completed Course  . penicillin G potassium 12 Million Units in dextrose 5 % 250 mL IVPB   . enoxaparin (LOVENOX) injection 40 mg   . prednisoLONE (ORAPRED) 15 MG/5ML solution 30 mg   . 0.9 %  sodium chloride infusion   . chlorproMAZINE (THORAZINE) tablet 10 mg   . nystatin (MYCOSTATIN) 100000 UNIT/ML suspension 500,000 Units   . dextrose 5 %-0.9 % sodium chloride infusion   . sulfamethoxazole-trimethoprim (BACTRIM DS) 800-160 MG per tablet 1 tablet   . rifampin (RIFADIN) capsule 600 mg   . isoniazid (NYDRAZID) tablet 300 mg   . pyridOXINE (VITAMIN B-6) tablet 50 mg   . pyrazinamide tablet 1,500 mg   . ethambutol (MYAMBUTOL) tablet 1,200 mg   . multivitamin with minerals tablet 1 tablet   . feeding supplement (ENSURE ENLIVE) (ENSURE ENLIVE) liquid 237 mL   . feeding supplement (BOOST / RESOURCE BREEZE) liquid 1 Container   . 0.9 %  sodium chloride infusion   . rifampin (RIFADIN) 60 mg/mL oral suspension 600 mg   . ethambutol (MYAMBUTOL) tablet 1,200 mg   . isoniazid (NYDRAZID) tablet 300 mg   . pyridOXINE (VITAMIN B-6) tablet 50 mg   . pyrazinamide tablet 1,500 mg   . rifampin (RIFADIN) 60 mg/mL oral suspension 600 mg   . bictegravir-emtricitabine-tenofovir AF  (BIKTARVY) 50-200-25 MG per tablet 1 tablet   . ganciclovir (CYTOVENE) 285 mg in sodium chloride 0.9 % 100 mL IVPB   . dexamethasone (DECADRON) 20 mg in sodium chloride 0.9 % 50 mL IVPB     Social History:  reports that he has been smoking. He has a 6.80 pack-year smoking history. He has never used smokeless tobacco. He reports current drug use. Drug: Marijuana. He reports that he does not drink alcohol.  Family History:   Family History  Problem Relation Age of Onset  . Diabetes Neg Hx   . CAD Neg Hx   .  Hypertension Neg Hx     Pertinent items are noted in HPI.  Blood pressure 98/70, pulse 84, temperature 97.7 F (36.5 C), temperature source Axillary, resp. rate 18, height 5\' 11"  (1.803 m), weight 57.7 kg, SpO2 (!) 89 %. General appearance: cachectic and slowed mentation Head: Normocephalic, without obvious abnormality, atraumatic Eyes: negative findings: lids and lashes normal, conjunctivae and sclerae normal and corneas clear Resp: rhonchi bilaterally Cardio: regular rate and rhythm and no rub GI: soft, non-tender; bowel sounds normal; no masses,  no organomegaly Extremities: extremities normal, atraumatic, no cyanosis or edema  Labs: Basic Metabolic Panel: Recent Labs  Lab 08/02/19 0335 08/03/19 0258 08/04/19 0332  08/04/19 2334 08/05/19 0232 08/05/19 0703 08/05/19 1124 08/06/19 0321 08/06/19 1444 08/06/19 1821 08/07/19 0447 08/07/19 1652 08/08/19 0329  NA 134* 132* 128*   < > 126* 127* 128* 130* 128*  --   --  125*  --  124*  K 4.3 4.6 4.6  --   --  4.3  --   --  3.7  --   --  4.5  --  4.7  CL 109 105 100  --   --  98  --   --  100  --   --  99  --  94*  CO2 20* 21* 23  --   --  22  --   --  21*  --   --  21*  --  23  GLUCOSE 111* 114* 123*  --   --  92  --   --  97  --   --  92  --  111*  BUN 11 9 9   --   --  8  --   --  7  --   --  10  --  14  CREATININE 0.78 0.81 0.72  --   --  0.70  --   --  0.63  --   --  0.63  --  0.74  ALBUMIN 2.6* 2.9* 2.7*  --   --   3.0*  --   --  2.5*  --   --   --   --   --   CALCIUM 9.1 9.3 8.9  --   --  9.2  --   --  8.8*  --   --  8.6*  --  9.2  PHOS  --   --  3.1  --   --   --   --   --   --  2.7 3.5 2.9 3.1  --    < > = values in this interval not displayed.   Liver Function Tests: Recent Labs  Lab 08/04/19 0332 08/05/19 0232 08/06/19 0321  AST 21 26 20   ALT 14 16 15   ALKPHOS 46 53 46  BILITOT 0.6 0.5 0.6  PROT 7.9 8.8* 7.4  ALBUMIN 2.7* 3.0* 2.5*   No results for input(s): LIPASE, AMYLASE in the last 168 hours. No results for input(s): AMMONIA in the last 168 hours. CBC: Recent Labs  Lab 08/05/19 0232 08/06/19 0704 08/07/19 0447 08/08/19 0329  WBC 2.7* 2.0* 2.3* 2.1*  HGB 14.3 11.8* 12.1* 13.3  HCT 40.5 33.6* 34.5* 38.1*  MCV 85.1 85.9 86.7 85.0  PLT 283 223 241 268   PT/INR: @labrcntip (inr:5) Cardiac Enzymes: No results for input(s): CKTOTAL, CKMB, CKMBINDEX, TROPONINI in the last 168 hours. CBG: Recent Labs  Lab 08/08/19 0006 08/08/19 0404 08/08/19 0930 08/08/19 1249 08/08/19 1641  GLUCAP 99 106* 113* 137*  155*    Iron Studies: No results for input(s): IRON, TIBC, TRANSFERRIN, FERRITIN in the last 168 hours.  Xrays/Other Studies: No results found.   Assessment/Plan:  1.  Hyponatremia- in pt with CNS infection and moderate protein malnutrition/cachexia.  His initial urine sodium was 111, urine osmolality 1,024.  These were repeated on 08/05/19 and urine Na was 154, urine osmolality 558, serum osmolality 267.  His IVF's were discontinued on 08/06/19.  He may have initially been hypovolemic but has been + 14 liters since admission.  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  Will recheck today but for now fluid restrict to 1 liter/day and follow sodium levels.   2. CNS toxoplasmosis with AIDS- improving with empiric therapy per ID as well as tx for neurosyphilis.  Includes  ethambutol, isoniazid, rifampicin, pyridoxine, pyrazinamide. 3. CMV retinitis/neuritis- on IV gancyclovir (negative CNS PCR) 4. Latent syphilis infection- on IV PCN and will get IM Bicillin after treatment completion on 08/13/19 per ID 5. AIDS- started on Biktarvy per ID 6. AMS- due to CNS infection 7. Severe protein and caloric malnutrition- on tube feeds 8. FTT- Palliative Care has been consulted. Currently a full code.   Julien Nordmann Braelyn Bordonaro 08/08/2019, 7:49 PM

## 2019-08-08 NOTE — Progress Notes (Signed)
[facility]   Reason for visit: Follow up on CNS infection, AIDS.    Interval History: now off of ganciclovir with negative CNS PCR, no acute events.  No associated rash or diarrhea.    Physical Exam: Constitutional:  Vitals:   08/08/19 0453 08/08/19 0756  BP:    Pulse:    Resp: 20   Temp:  97.8 F (36.6 C)  SpO2:     patient appears in NAD Respiratory: Normal respiratory effort; CTA B Cardiovascular: RRR GI: soft, nt, nd MS: no edema Neuro: does not respond to questions; tearful  Review of Systems: Unable to be assessed due to patient factors  Lab Results  Component Value Date   WBC 2.1 (L) 08/08/2019   HGB 13.3 08/08/2019   HCT 38.1 (L) 08/08/2019   MCV 85.0 08/08/2019   PLT 268 08/08/2019    Lab Results  Component Value Date   CREATININE 0.74 08/08/2019   BUN 14 08/08/2019   NA 124 (L) 08/08/2019   K 4.7 08/08/2019   CL 94 (L) 08/08/2019   CO2 23 08/08/2019    Lab Results  Component Value Date   ALT 15 08/06/2019   AST 20 08/06/2019   ALKPHOS 46 08/06/2019     Microbiology: Recent Results (from the past 240 hour(s))  CSF culture with Stat gram stain     Status: None   Collection Time: 07/30/19  1:46 PM   Specimen: CSF; Cerebrospinal Fluid  Result Value Ref Range Status   Specimen Description CSF  Final   Special Requests NONE  Final   Gram Stain   Final    WBC PRESENT, PREDOMINANTLY MONONUCLEAR NO ORGANISMS SEEN CYTOSPIN SMEAR    Culture   Final    NO GROWTH 3 DAYS Performed at [facility], [address], [place], [place] [address]    Report Status 08/02/2019 FINAL  Final  Acid Fast Smear (AFB)     Status: None   Collection Time: 07/30/19  1:46 PM  Result Value Ref Range Status   AFB Specimen Processing Concentration  Final   Acid Fast Smear Negative  Final    Comment: (NOTE) Performed At: [facility] [place], [place] [id number] Rush Farmer MD [id number]    Source  (AFB) CSF  Final    Comment: Performed at [facility], [place] [address], [place], [place] [address]  Andee Poles, PCR     Status: Abnormal   Collection Time: 07/30/19  2:08 PM   Specimen: Cerebrospinal Fluid  Result Value Ref Range Status   Toxoplasma Gondii, PCR Positive (A) Negative Final    Comment: (NOTE) Toxoplasma gondii DNA Detected. This test was developed and its performance characteristics determined by [employer]. It has not been cleared or approved by the U.S. Food and Drug Administration. The FDA has determined that such clearance or approval is not necessary. This test is used for clinical purposes. It should not be regarded as investigational or research. Performed At: [facility] [address] [place], [place] [id number] Rush Farmer MD [id number]   MTB RIF NAA Non-Sputum, w/o Culture     Status: None (Preliminary result)   Collection Time: 07/31/19 10:30 AM   Specimen: CSF; Cerebrospinal Fluid  Result Value Ref Range Status   Source PENDING  Incomplete   Specimen Source Comment  Final    Comment: Cerebrospinal fluid (CSF)   M tuberculosis complex Comment NOT DETECTED Final  Comment: (NOTE) Mycobacterium tuberculosis complex (MTBC) NOT detected. This test was developed and its performance characteristics determined by [facility]. It has not been cleared or approved by the Food and Drug Administration. Per the College of American Pathologists (CAP) guidelines, a culture must be performed on all samples regardless of the molecular test result. By ordering this test code, the client has assumed responsibility for the performance of the culture.    Rifampin Comment Susceptible Final    Comment: (NOTE) Because Mycobacterium tuberculosis complex (MTBC) was not detected, no rifampin determination is possible. This test was developed and its performance characteristics determined by [facility]. It has not been cleared or approved by  the Food and Drug Administration.    AFB Specimen Processing Direct Inoculation  Final    Comment: (NOTE) Performed At: [facility] [address] [place], [place] [id number] Jolene Schimke MD [id number]     Impression/Plan:  1. CNS lesions.  Has improved with empiric therapy.  Continuing on penicillin   2.  Optic neurititis, retinitis - continuing treatment for neurosyphilis. CMV PCR negative in the CNS.    3.  HIV/AIDS - I have started the [facility] and tolerating.    4.  OI risk - on Bactrim for prophylaxis.    5. Leukopenia - stable, now off of ganciclovir.  Continue to monitor.   6.  Syphilis - will give IM Bicillin after IV treatment completion on 11/17.

## 2019-08-09 ENCOUNTER — Inpatient Hospital Stay (HOSPITAL_COMMUNITY): Payer: Medicaid Other

## 2019-08-09 DIAGNOSIS — R112 Nausea with vomiting, unspecified: Secondary | ICD-10-CM

## 2019-08-09 LAB — OSMOLALITY, URINE: Osmolality, Ur: 1008 mOsm/kg — ABNORMAL HIGH (ref 300–900)

## 2019-08-09 LAB — BASIC METABOLIC PANEL
Anion gap: 9 (ref 5–15)
BUN: 14 mg/dL (ref 6–20)
CO2: 23 mmol/L (ref 22–32)
Calcium: 9.1 mg/dL (ref 8.9–10.3)
Chloride: 88 mmol/L — ABNORMAL LOW (ref 98–111)
Creatinine, Ser: 0.68 mg/dL (ref 0.61–1.24)
GFR calc Af Amer: >60 mL/min (ref >60)
GFR calc non Af Amer: >60 mL/min (ref >60)
Glucose, Bld: 96 mg/dL (ref 70–99)
Potassium: 4.9 mmol/L (ref 3.5–5.1)
Sodium: 120 mmol/L — ABNORMAL LOW (ref 135–145)

## 2019-08-09 LAB — SODIUM
Sodium: 120 mmol/L — ABNORMAL LOW (ref 135–145)
Sodium: 120 mmol/L — ABNORMAL LOW (ref 135–145)
Sodium: 116 mmol/L — CL (ref 135–145)

## 2019-08-09 LAB — CBC
HCT: 39.5 % (ref 39.0–52.0)
Hemoglobin: 13.9 g/dL (ref 13.0–17.0)
MCH: 29.9 pg (ref 26.0–34.0)
MCHC: 35.2 g/dL (ref 30.0–36.0)
MCV: 84.9 fL (ref 80.0–100.0)
Platelets: 305 10*3/uL (ref 150–400)
RBC: 4.65 MIL/uL (ref 4.22–5.81)
RDW: 13.3 % (ref 11.5–15.5)
WBC: 2.2 10*3/uL — ABNORMAL LOW (ref 4.0–10.5)
nRBC: 0 % (ref 0.0–0.2)

## 2019-08-09 LAB — GLUCOSE, CAPILLARY
Glucose-Capillary: 105 mg/dL — ABNORMAL HIGH (ref 70–99)
Glucose-Capillary: 116 mg/dL — ABNORMAL HIGH (ref 70–99)
Glucose-Capillary: 102 mg/dL — ABNORMAL HIGH (ref 70–99)
Glucose-Capillary: 132 mg/dL — ABNORMAL HIGH (ref 70–99)
Glucose-Capillary: 126 mg/dL — ABNORMAL HIGH (ref 70–99)

## 2019-08-09 LAB — SODIUM, URINE, RANDOM: Sodium, Ur: 243 mmol/L

## 2019-08-09 LAB — CREATININE, URINE, RANDOM: Creatinine, Urine: 103.71 mg/dL

## 2019-08-09 IMAGING — DX DG CHEST 1V PORT
1 series · 1 of 1 positions shown · non-contrast
Comparison: [DATE]

CLINICAL DATA: Low oxygen saturation. Possible aspiration. NG tube
placement.

EXAM:
PORTABLE CHEST 1 VIEW

[chest]
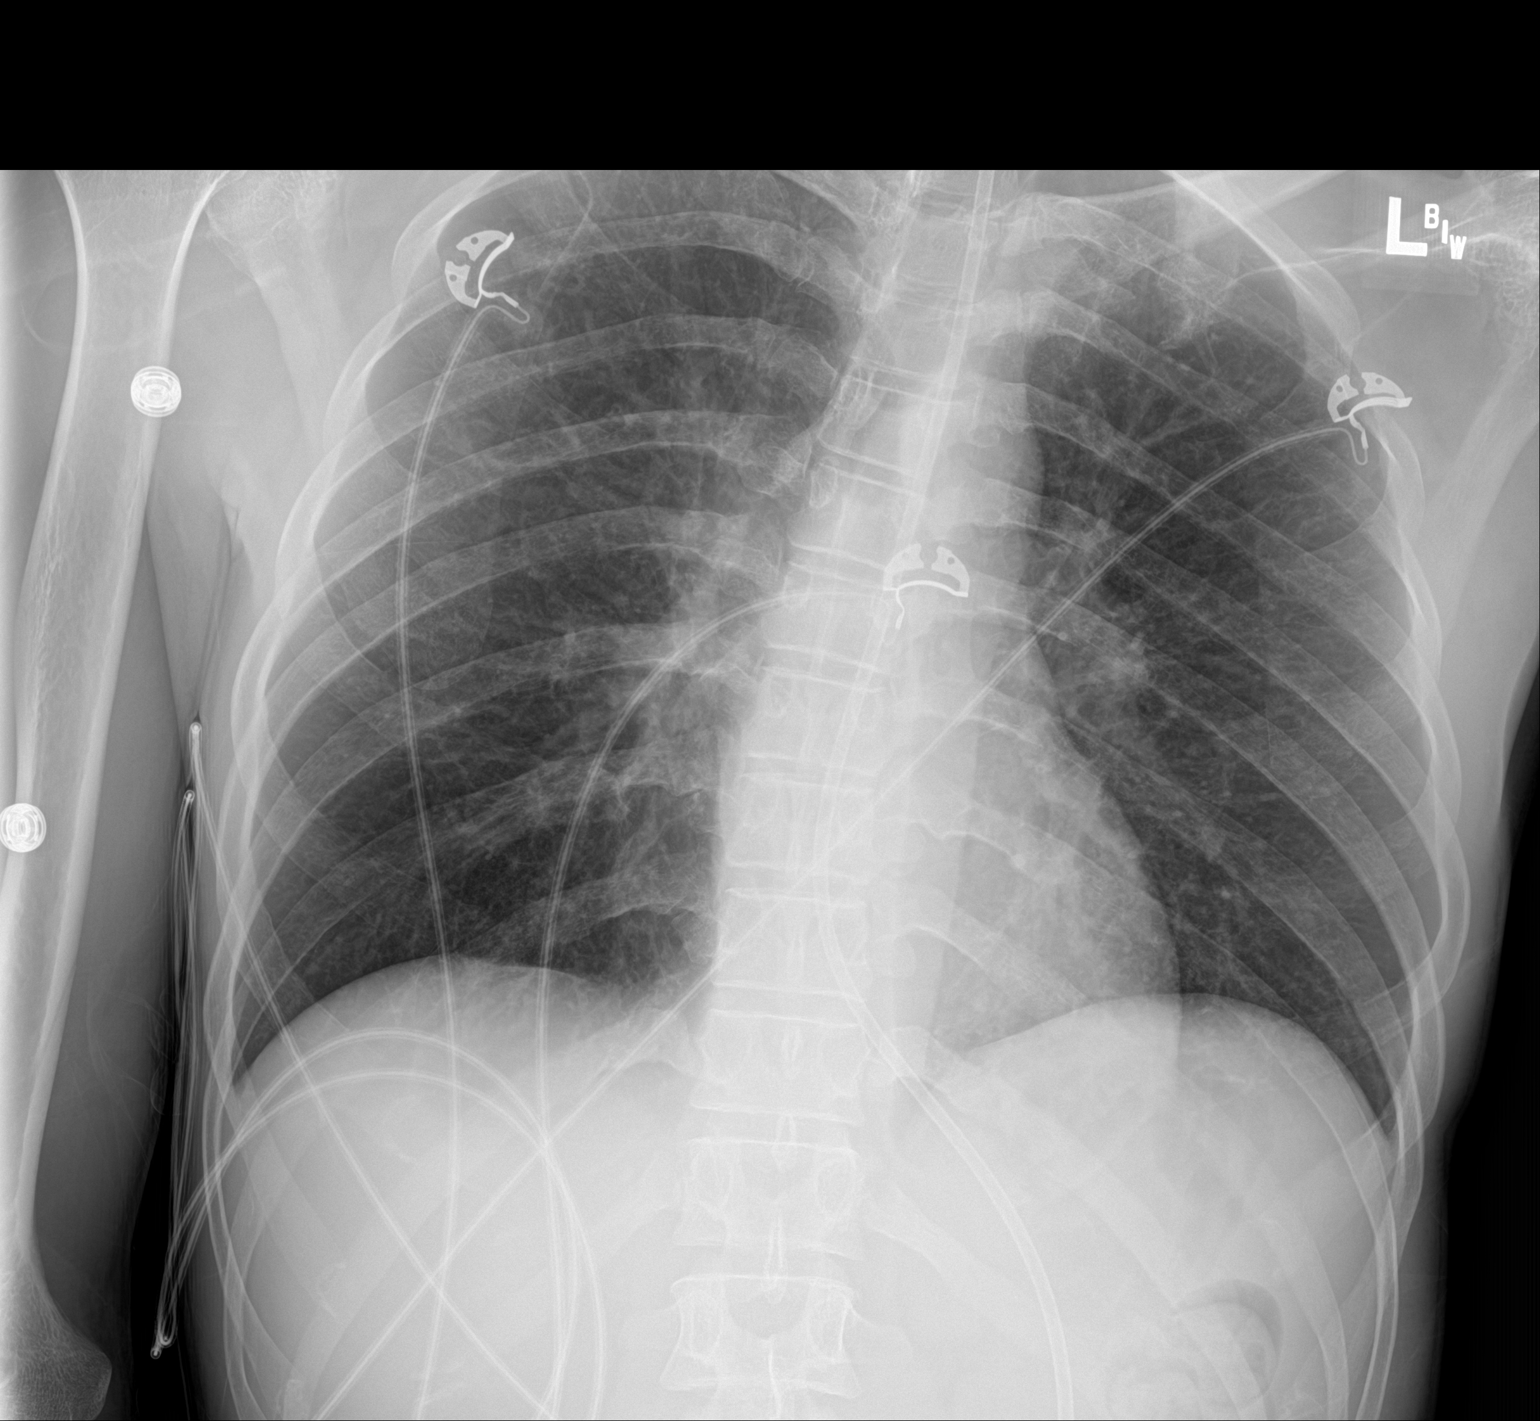

[1 of 1 positions shown; findings below may reference images not displayed]

FINDINGS: Enteric tube in place with tip below the diaphragm, not included in
the field of view. Normal heart size and mediastinal contours. No
focal airspace disease, pulmonary edema, or pleural effusion. No
pneumothorax. No acute osseous abnormalities are seen. Patient is
slightly rotated.
IMPRESSION: 1. Enteric tube in place with tip below the diaphragm, not included
in the field of view.
2. No acute pulmonary findings.

## 2019-08-09 MED ORDER — SULFAMETHOXAZOLE-TRIMETHOPRIM 400-80 MG/5ML IV SOLN
160.0000 mg | Freq: Every day | INTRAVENOUS | Status: DC
  Administered 2019-08-10 – 2019-08-11 (×2): 160 mg via INTRAVENOUS
  Filled 2019-08-09 (×2): qty 10

## 2019-08-09 MED ORDER — FUROSEMIDE 10 MG/ML IJ SOLN
20.0000 mg | Freq: Two times a day (BID) | INTRAMUSCULAR | Status: DC
  Administered 2019-08-09: 20 mg via INTRAVENOUS
  Filled 2019-08-09 (×2): qty 2

## 2019-08-09 MED ORDER — METHYLPREDNISOLONE SODIUM SUCC 40 MG IJ SOLR: 10.0000 mg | Freq: Once | INTRAMUSCULAR | Status: DC

## 2019-08-09 MED ORDER — METRONIDAZOLE IN NACL 5-0.79 MG/ML-% IV SOLN
500.0000 mg | Freq: Three times a day (TID) | INTRAVENOUS | Status: AC
  Administered 2019-08-09 – 2019-08-13 (×13): 500 mg via INTRAVENOUS
  Filled 2019-08-09 (×13): qty 100

## 2019-08-09 MED ORDER — SODIUM CHLORIDE 0.9 % IV SOLN
5.0000 mg | Freq: Once | INTRAVENOUS | Status: AC
  Administered 2019-08-10: 5 mg via INTRAVENOUS
  Filled 2019-08-09: qty 0.5

## 2019-08-09 MED ORDER — ABACAVIR-DOLUTEGRAVIR-LAMIVUD 600-50-300 MG PO TABS
1.0000 | ORAL_TABLET | Freq: Every day | ORAL | Status: DC
  Administered 2019-08-10 – 2019-08-11 (×2): 1 via ORAL
  Filled 2019-08-09 (×4): qty 1

## 2019-08-09 MED ORDER — METHYLPREDNISOLONE SODIUM SUCC 40 MG IJ SOLR
20.0000 mg | Freq: Every day | INTRAMUSCULAR | Status: DC
  Administered 2019-08-11 – 2019-08-12 (×2): 20 mg via INTRAVENOUS
  Filled 2019-08-09 (×2): qty 1

## 2019-08-09 NOTE — Progress Notes (Signed)
Pt continues to experience nausea/vomiting this shift. Suctioning provided to pt as needed. Tube feed stopped per MD and nausea medication given. Pt's 02 sats dropping to high 80's- MD made aware as well. See orders. Will continue to monitor, safety sitter at bedside.

## 2019-08-09 NOTE — Progress Notes (Addendum)
[facility]  Date of Admission:  07/28/2019     Total days of antibiotics 12         ASSESSMENT:  Arthur [name] has developed nausea and vomiting overnight requiring tube feeds to be stopped and concern for aspiration with chest x-ray pending.  He has 4 days of Penicillin G remaining for treatment of neurosyphilis and will then require 2.4 million units of Bicillin IM once scheduled on 11/17. Will change medication to Triumeq which can be crushed and administered in tube and change Bactrim to IV pending ability to take oral medications. Continue current dose of penicillin.   ADDENDUM:  On revisit to Arthur [name] room he was noted to have oxygen saturation of 87% on 6L nasal cannula. X-ray reviewed with no significant findings. Given concern with new onset vomiting will start IV metronidazole to cover for anaerobes .  PLAN:  1. Continue penicillin for neurosyphilis 2. Change Biktarvy to Triumeq which can be administered via tube. 3. Await chest x-ray results and monitor for fever. 4. Continue with aspiration precautions.  5. Start Flagyl for anaerobic coverage with concern for aspiration pneumonia  Principal Problem:   Neurosyphilis Active Problems:   Acquired immunodeficiency syndrome (HCC)   Nausea and vomiting   Bradycardia   Hyponatremia   Vision loss   Abnormal brain MRI   Palliative care encounter   Thrush of mouth and esophagus (HCC)   . [START ON 08/10/2019] abacavir-dolutegravir-lamiVUDine  1 tablet Oral Daily  . enoxaparin (LOVENOX) injection  40 mg Subcutaneous Q24H  . feeding supplement (OSMOLITE 1.5 CAL)  1,000 mL Per Tube Q24H  . feeding supplement (PRO-STAT SUGAR FREE 64)  30 mL Per Tube BID  . furosemide  20 mg Intravenous BID  . magic mouthwash w/lidocaine  10 mL Oral TID AC & HS  . mouth rinse  15 mL Mouth Rinse BID  . multivitamin with minerals  1 tablet Per Tube Daily  . [START ON 08/13/2019] penicillin g benzathine  (BICILLIN-LA) IM  2.4 Million Units Intramuscular Once  . sodium chloride flush  3 mL Intravenous Once  . sodium chloride flush  3 mL Intravenous Q12H    SUBJECTIVE:  Afebrile overnight. Per nursing he did not rest much last night as he was unable to get comfortable and has vomited multiple times today. Tube feedings have been stopped. Chest x-ray obtained with concern for aspiration which is pending.   No Known Allergies   Review of Systems: Review of Systems  Unable to perform ROS: Medical condition      OBJECTIVE: Vitals:   08/09/19 0338 08/09/19 0752 08/09/19 1300 08/09/19 1320  BP:  102/66    Pulse:  73 69   Resp:  (!) 22 (!) 26 (!) 22  Temp: (!) 97.5 F (36.4 C) 97.8 F (36.6 C)    TempSrc: Oral Oral    SpO2:  98% 90%   Weight: 54.5 kg     Height:       Body mass index is 16.76 kg/m.  Physical Exam Constitutional:      General: He is not in acute distress.    Appearance: He is well-developed.     Comments: Lying in bed with head of bed elevated; lethargic.   Cardiovascular:     Rate and Rhythm: Normal rate and regular rhythm.     Heart sounds: Normal heart sounds.  Pulmonary:     Effort: Pulmonary effort is normal.     Breath sounds:  Rhonchi present.  Skin:    General: Skin is warm and dry.  Neurological:     Mental Status: He is alert and oriented to person, place, and time.  Psychiatric:        Behavior: Behavior normal.        Thought Content: Thought content normal.        Judgment: Judgment normal.     Lab Results Lab Results  Component Value Date   WBC 2.2 (L) 08/09/2019   HGB 13.9 08/09/2019   HCT 39.5 08/09/2019   MCV 84.9 08/09/2019   PLT 305 08/09/2019    Lab Results  Component Value Date   CREATININE 0.68 08/09/2019   BUN 14 08/09/2019   NA 120 (L) 08/09/2019   K 4.9 08/09/2019   CL 88 (L) 08/09/2019   CO2 23 08/09/2019    Lab Results  Component Value Date   ALT 15 08/06/2019   AST 20 08/06/2019   ALKPHOS 46 08/06/2019    BILITOT 0.6 08/06/2019     Microbiology: Recent Results (from the past 240 hour(s))  MTB RIF NAA Non-Sputum, w/o Culture     Status: None (Preliminary result)   Collection Time: 07/31/19 10:30 AM   Specimen: CSF; Cerebrospinal Fluid  Result Value Ref Range Status   Source PENDING  Incomplete   Specimen Source Comment  Final    Comment: Cerebrospinal fluid (CSF)   M tuberculosis complex Comment NOT DETECTED Final    Comment: (NOTE) Mycobacterium tuberculosis complex (MTBC) NOT detected. This test was developed and its performance characteristics determined by [facility]. It has not been cleared or approved by the Food and Drug Administration. Per the College of American Pathologists (CAP) guidelines, a culture must be performed on all samples regardless of the molecular test result. By ordering this test code, the client has assumed responsibility for the performance of the culture.    Rifampin Comment Susceptible Final    Comment: (NOTE) Because Mycobacterium tuberculosis complex (MTBC) was not detected, no rifampin determination is possible. This test was developed and its performance characteristics determined by [facility]. It has not been cleared or approved by the Food and Drug Administration.    AFB Specimen Processing Direct Inoculation  Final    Comment: (NOTE) Performed At: [facility] [address] [place], [place] [id number] Rush Farmer MD [id number]      Terri Piedra, [place] [facility] [phone] Pager  08/09/2019  3:15 PM

## 2019-08-09 NOTE — Progress Notes (Signed)
Admit: 07/28/2019 LOS: 11  55M with AIDS, Hyponatremia; Neurosyphilis, CNS lesions  Subjective:  . N/V, more somnolent today . Some concern for aspiration . Off TFs . Cont on IV PCN, IV Fluc   Sodium (mmol/L)  Date Value  08/09/2019 120 (L)  08/09/2019 120 (L)  08/08/2019 124 (L)  08/07/2019 125 (L)  08/06/2019 128 (L)  08/05/2019 130 (L)  08/05/2019 128 (L)  08/05/2019 127 (L)  08/04/2019 126 (L)  08/04/2019 128 (L)     11/12 0701 - 11/13 0700 In: 1483.2 [I.V.:199.5; NG/GT:592.4; IV Piggyback:691.3] Out: 1300 [Urine:900; Emesis/NG output:400]  Filed Weights   08/02/19 0615 08/08/19 0407 08/09/19 0338  Weight: 55.1 kg 57.7 kg 54.5 kg    Scheduled Meds: . [START ON 08/10/2019] abacavir-dolutegravir-lamiVUDine  1 tablet Oral Daily  . enoxaparin (LOVENOX) injection  40 mg Subcutaneous Q24H  . feeding supplement (OSMOLITE 1.5 CAL)  1,000 mL Per Tube Q24H  . feeding supplement (PRO-STAT SUGAR FREE 64)  30 mL Per Tube BID  . furosemide  20 mg Intravenous BID  . magic mouthwash w/lidocaine  10 mL Oral TID AC & HS  . mouth rinse  15 mL Mouth Rinse BID  . [START ON 08/12/2019] methylPREDNISolone (SOLU-MEDROL) injection  10 mg Intravenous Once  . [START ON 08/11/2019] methylPREDNISolone (SOLU-MEDROL) injection  20 mg Intravenous Daily  . multivitamin with minerals  1 tablet Per Tube Daily  . [START ON 08/13/2019] penicillin g benzathine (BICILLIN-LA) IM  2.4 Million Units Intramuscular Once  . sodium chloride flush  3 mL Intravenous Once  . sodium chloride flush  3 mL Intravenous Q12H   Continuous Infusions: . sodium chloride Stopped (08/09/19 0023)  . chlorproMAZINE (THORAZINE) IV 12.5 mg (08/09/19 1135)  . [START ON 08/10/2019] dexamethasone (DECADRON) IVPB ([facility])    . fluconazole (DIFLUCAN) IV Stopped (08/08/19 1746)  . penicillin g continuous IV infusion 12 Million Units (08/09/19 0438)  . [START ON 08/10/2019] sulfamethoxazole-trimethoprim     PRN Meds:.sodium  chloride, acetaminophen **OR** acetaminophen, chlorproMAZINE (THORAZINE) IV, haloperidol lactate, morphine injection, ondansetron **OR** ondansetron (ZOFRAN) IV, phenol, sodium chloride flush  Current Labs: reviewed    Ref. Range 07/28/2019 22:01 08/05/2019 08:00 08/08/2019 18:37  Osmolality, Urine Latest Ref Range: 300 - 900 mOsm/kg 1,024 (H) 558 1,008 (H)  Sodium, Urine Latest Units: mmol/L 111 154 243     Ref. Range 08/08/2019 19:03  Osmolality Latest Ref Range: 275 - 295 mOsm/kg 263 (L)    Physical Exam:  Blood pressure 102/66, pulse 69, temperature 97.8 F (36.6 C), temperature source Oral, resp. rate (!) 22, height 5\' 11"  (1.803 m), weight 54.5 kg, SpO2 90 %. Encephalopathic/Somnolent, doesn't interact RRR No LEE Soft NGT in place  A 1. Hyponatremia, Euvolemic, Hypotonic: UOsms and UNa both elevated.  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  Not as hypotonc in serum as I would have expected.   2. AMS/Encepahlopathy: numerous causes including #1 hard to parse out 3. AIDS 4. CNS lesions 5. Neurosyphilis  P . Need to get UOsms down, start Lasix 20 BID . Fluid restrict very aggressively, no PO and only what comes in IV . Serial SNa . If worsens further need to consider 3% Saline (but this is hypotonic compared to urine so probably wouldn't be terribly effective) vs Vaptan . AM UOsm and MD 08/09/2019, 4:16 PM  Recent Labs  Lab 08/06/19 1821 08/07/19 0447 08/07/19 1652 08/08/19 0329 08/09/19 0333 08/09/19 1341  NA  --  125*  --  124* 120* 120*  K  --  4.5  --  4.7 4.9  --   CL  --  99  --  94* 88*  --   CO2  --  21*  --  23 23  --   GLUCOSE  --  92  --  111* 96  --   BUN  --  10  --  14 14  --   CREATININE  --  0.63  --  0.74 0.68  --   CALCIUM  --  8.6*  --  9.2 9.1  --   PHOS 3.5 2.9 3.1  --   --   --    Recent Labs  Lab 08/07/19 0447 08/08/19 0329 08/09/19 0333  WBC 2.3* 2.1* 2.2*  HGB 12.1* 13.3 13.9  HCT 34.5* 38.1* 39.5  MCV 86.7 85.0  84.9  PLT 241 268 305

## 2019-08-09 NOTE — Progress Notes (Signed)
PROGRESS NOTE    Arthur [name]  WUJ:[id number] DOB: [birth year] DOA: 07/28/2019 PCP: Randall Hiss, MD    Brief Narrative:  depression, GERD, substance abusepresented to ED on 07/28/2019 with a complaint of dizziness, weakness, intermittent headache. He has not taken any of his HIV medications for about 1 year due to affordability issue. MRI brain done in ED showed Widespread areas of abnormal low level restricted diffusion and postcontrast enhancement throughout both cerebral hemispheres, predominantly infratentorial, with the dominant abnormality in the LEFT anterior frontal white matter consistent with an opportunistic infection, such as toxoplasmosis, tuberculosis or fungal cerebritis/meningitis, or parasitic infection.  Patient was admitted to hospital service and ID was consulted. He then revealed to [place] that he has lost his vision in his right eye. ID started him on penicillin and ganciclovir for possible CMV retinitis versus synovitis retinitis and ophthalmology also saw him.  07/30/2019-status post lumbar puncture by neurology.  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 intake,mild hypotensive.    Consultants:  Neurology, infectious disease, palliative care  Procedures:Lumbar puncture  Antimicrobials: High-dose penicillin for neurosyphilis Bactrim for prophylaxis      Subjective: Patient seen and examined.  Per nursing he vomited this morning.  He was not placed on fluid restriction as orders were placed.  He is moving his head to respond to my questions and denies shortness of breath, fever, chills or pain  Objective: Vitals:   08/09/19 0338 08/09/19 0752 08/09/19 1300 08/09/19 1320  BP:  102/66    Pulse:  73 69   Resp:  (!) 22 (!) 26 (!) 22  Temp: (!) 97.5 F  (36.4 C) 97.8 F (36.6 C)    TempSrc: Oral Oral    SpO2:  98% 90%   Weight: 54.5 kg     Height:        Intake/Output Summary (Last 24 hours) at 08/09/2019 1357 Last data filed at 08/09/2019 1100 Gross per 24 hour  Intake 1773.23 ml  Output 900 ml  Net 873.23 ml   Filed Weights   08/02/19 0615 08/08/19 0407 08/09/19 0338  Weight: 55.1 kg 57.7 kg 54.5 kg    Examination: General exam:NAD, sleepy but responds to my questions by shaking his head, laying in bed . Has hiccup respiratory system: sounds junky/rhochorus b/l anteriorly Cardiovascular system:S1 &S2 heard, RRR. No gallops or rubs or murmurs Gastrointestinal system:Abdomen is nondistended, soft and nontender.Normal bowel sounds heard. Central nervous system: Sleepy. Not too cooperative with exam Extremities:No edema Skin:Warm dry   Data Reviewed: I have personally reviewed following labs and imaging studies  CBC: Recent Labs  Lab 08/05/19 0232 08/06/19 0704 08/07/19 0447 08/08/19 0329 08/09/19 0333  WBC 2.7* 2.0* 2.3* 2.1* 2.2*  HGB 14.3 11.8* 12.1* 13.3 13.9  HCT 40.5 33.6* 34.5* 38.1* 39.5  MCV 85.1 85.9 86.7 85.0 84.9  PLT 283 223 241 268 305   Basic Metabolic Panel: Recent Labs  Lab 08/04/19 0332  08/05/19 0232  08/05/19 1124 08/06/19 0321 08/06/19 1444 08/06/19 1821 08/07/19 0447 08/07/19 1652 08/08/19 0329 08/09/19 0333  NA 128*   < > 127*   < > 130* 128*  --   --  125*  --  124* 120*  K 4.6  --  4.3  --   --  3.7  --   --  4.5  --  4.7 4.9  CL 100  --  98  --   --  100  --   --  99  --  94* 88*  CO2 23  --  22  --   --  21*  --   --  21*  --  23 23  GLUCOSE 123*  --  92  --   --  97  --   --  92  --  111* 96  BUN 9  --  8  --   --  7  --   --  10  --  14 14  CREATININE 0.72  --  0.70  --   --  0.63  --   --  0.63  --  0.74 0.68  CALCIUM 8.9  --  9.2  --   --  8.8*  --   --  8.6*  --  9.2 9.1  MG 1.7  --   --   --   --   --  1.6* 1.6* 1.7 1.8  --   --   PHOS 3.1  --   --   --    --   --  2.7 3.5 2.9 3.1  --   --    < > = values in this interval not displayed.   GFR: Estimated Creatinine Clearance: 98.4 mL/min (by C-G formula based on SCr of 0.68 mg/dL). Liver Function Tests: Recent Labs  Lab 08/03/19 0258 08/04/19 0332 08/05/19 0232 08/06/19 0321  AST 22 21 26 20   ALT 17 14 16 15   ALKPHOS 47 46 53 46  BILITOT 0.6 0.6 0.5 0.6  PROT 8.4* 7.9 8.8* 7.4  ALBUMIN 2.9* 2.7* 3.0* 2.5*   No results for input(s): LIPASE, AMYLASE in the last 168 hours. No results for input(s): AMMONIA in the last 168 hours. Coagulation Profile: No results for input(s): INR, PROTIME in the last 168 hours. Cardiac Enzymes: No results for input(s): CKTOTAL, CKMB, CKMBINDEX, TROPONINI in the last 168 hours. BNP (last 3 results) No results for input(s): PROBNP in the last 8760 hours. HbA1C: No results for input(s): HGBA1C in the last 72 hours. CBG: Recent Labs  Lab 08/08/19 2010 08/08/19 2354 08/09/19 0341 08/09/19 0750 08/09/19 1132  GLUCAP 116* 124* 105* 116* 102*   Lipid Profile: No results for input(s): CHOL, HDL, LDLCALC, TRIG, CHOLHDL, LDLDIRECT in the last 72 hours. Thyroid Function Tests: Recent Labs    08/08/19 1903  TSH 0.792   Anemia Panel: No results for input(s): VITAMINB12, FOLATE, FERRITIN, TIBC, IRON, RETICCTPCT in the last 72 hours. Sepsis Labs: Recent Labs  Lab 08/07/19 2350 08/08/19 0329  LATICACIDVEN 0.6 1.1    Recent Results (from the past 240 hour(s))  Toxoplasma Gondii, PCR     Status: Abnormal   Collection Time: 07/30/19  2:08 PM   Specimen: Cerebrospinal Fluid  Result Value Ref Range Status   Toxoplasma Gondii, PCR Positive (A) Negative Final    Comment: (NOTE) Toxoplasma gondii DNA Detected. This test was developed and its performance characteristics determined by 13/12/20. It has not been cleared or approved by the U.S. Food and Drug Administration. The FDA has determined that such clearance or approval is not  necessary. This test is used for clinical purposes. It should not be regarded as investigational or research. Performed At: [facility] [address] [place], [address] [place] [id number]   MTB RIF NAA Non-Sputum, w/o Culture     Status: None (Preliminary result)   Collection Time: 07/31/19 10:30 AM  Specimen: CSF; Cerebrospinal Fluid  Result Value Ref Range Status   Source PENDING  Incomplete   Specimen Source Comment  Final    Comment: Cerebrospinal fluid (CSF)   M tuberculosis complex Comment NOT DETECTED Final    Comment: (NOTE) Mycobacterium tuberculosis complex (MTBC) NOT detected. This test was developed and its performance characteristics determined by [facility]. It has not been cleared or approved by the Food and Drug Administration. Per the College of American Pathologists (CAP) guidelines, a culture must be performed on all samples regardless of the molecular test result. By ordering this test code, the client has assumed responsibility for the performance of the culture.    Rifampin Comment Susceptible Final    Comment: (NOTE) Because Mycobacterium tuberculosis complex (MTBC) was not detected, no rifampin determination is possible. This test was developed and its performance characteristics determined by [facility]. It has not been cleared or approved by the Food and Drug Administration.    AFB Specimen Processing Direct Inoculation  Final    Comment: (NOTE) Performed At: [facility] [place], [place] [id number] Rush Farmer MD IO:[id number]          Radiology Studies: No results found.      Scheduled Meds:  bictegravir-emtricitabine-tenofovir AF  1 tablet Oral Daily   enoxaparin (LOVENOX) injection  40 mg Subcutaneous Q24H   feeding supplement (OSMOLITE 1.5 CAL)  1,000 mL Per Tube Q24H   feeding supplement (PRO-STAT SUGAR FREE 64)  30 mL Per Tube BID   furosemide  20 mg Intravenous BID    magic mouthwash w/lidocaine  10 mL Oral TID AC & HS   mouth rinse  15 mL Mouth Rinse BID   multivitamin with minerals  1 tablet Per Tube Daily   [START ON 08/13/2019] penicillin g benzathine (BICILLIN-LA) IM  2.4 Million Units Intramuscular Once   sodium chloride flush  3 mL Intravenous Once   sodium chloride flush  3 mL Intravenous Q12H   sulfamethoxazole-trimethoprim  1 tablet Per Tube Daily   Continuous Infusions:  sodium chloride Stopped (08/09/19 0023)   chlorproMAZINE (THORAZINE) IV 12.5 mg (08/09/19 1135)   [START ON 08/10/2019] dexamethasone (DECADRON) IVPB ([facility])     fluconazole (DIFLUCAN) IV Stopped (08/08/19 1746)   penicillin g continuous IV infusion 12 Million Units (08/09/19 0438)    Assessment & Plan:   Principal Problem:   Vision loss Active Problems:   Late syphilis   Acquired immunodeficiency syndrome (HCC)   Bradycardia   Hyponatremia   HIV (human immunodeficiency virus infection) ([place])   Abnormal brain MRI   Palliative care encounter   Bacterial meningitis   Thrush of mouth and esophagus ([place])   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 TB gold plus negative.MTB probenegative ID following-recommend continuing penicillin for 14 days  RIPE was stopped   Right eye blindless/Optic retinitis/neurititis mainly concerned between syphilis and CMV-but CMV PCR negative in the CNS. Received steroid injection 08/02/2019 Receivedethambutol, isoniazid, rifampicin and pyridoxine, pyrazinamide for tuberculosis-but then discontinued Ganciclovir forCMVwas d/c'd now with negative CNS PCR Bactrim for prophylaxis. Using as needed Haldol and bedside sitter for safety and fall risk. Repeat MRIbrain and orbits show improvement of brain lesions and optic  neurititis, retinitis. Per ID-continue On High-dose penicillin for neurosyphilis    Hypovolemic hyponatremia TSH was normal range Na leveltrending downward. 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  Discussed importance of fluid restriction 1L/hr with nsg staff. Monitor labs  Vomiting- per nsg pt vomited this am. On exam he sound rhonchorus. Concern for aspiration Will hold feeding for now [facility] on aspiration precautions    Leukopenia/immunocompromised in the setting of untreated HIV WBC 2.7>>2.1 Continue to closely monitor  Untreated HIV started the [facility] [id number]  ID following   Hypomagnesemia Replaced and stable   Acute metabolic encephalopathy: Due to #1. As needed Ativan and Haldol.  Appreciate neurology input.   Dysphagia Seen by speech therapist N.p.o. Cortrackteam consulted for placement Dietitian for PEG tube feeding--->hold due to vomiting this am  Severe protein calorie malnutrition Albumin 2.7 with BMI 16 Dietitian consulted following diet. cortrack tubeplacement on 08/06/2019.  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 Treating as above.  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 Seen by palliative care. Currently recommending continuing aggressive measures.   DVT prophylaxis:Lovenox subcu daily Code Status:Full code Family Communication:None Disposition Plan:Plan to DC when infectious disease signs offand medically stable.  PT/OT recommend SNF, supervision/assistance 24 hours.   will be here >2 MN stays as pt still not medically stable.          LOS: 11 days   Time spent: 45 minutes with more than 50% on COC    Lynn ItoSahar Iwalani Templeton, MD [facility] Pager [phone]  If 7PM-7AM, please contact night-coverage [email or web address] Password [email or web address] 08/09/2019, 1:57 PM

## 2019-08-09 NOTE — Progress Notes (Signed)
Pt Na level is 116. Provider notified.

## 2019-08-09 NOTE — Plan of Care (Signed)
  Problem: Nutrition: Goal: Adequate nutrition will be maintained Outcome: Progressing Tube feeding infusing per order   Problem: Coping: Goal: Level of anxiety will decrease Outcome: Progressing   Problem: Elimination: Goal: Will not experience complications related to urinary retention Outcome: Progressing   Problem: Safety: Goal: Ability to remain free from injury will improve Outcome: Progressing Safety sitter at bedside

## 2019-08-10 DIAGNOSIS — E871 Hypo-osmolality and hyponatremia: Secondary | ICD-10-CM

## 2019-08-10 DIAGNOSIS — J9601 Acute respiratory failure with hypoxia: Secondary | ICD-10-CM

## 2019-08-10 LAB — GLUCOSE, CAPILLARY
Glucose-Capillary: 103 mg/dL — ABNORMAL HIGH (ref 70–99)
Glucose-Capillary: 103 mg/dL — ABNORMAL HIGH (ref 70–99)
Glucose-Capillary: 111 mg/dL — ABNORMAL HIGH (ref 70–99)
Glucose-Capillary: 116 mg/dL — ABNORMAL HIGH (ref 70–99)
Glucose-Capillary: 126 mg/dL — ABNORMAL HIGH (ref 70–99)
Glucose-Capillary: 131 mg/dL — ABNORMAL HIGH (ref 70–99)
Glucose-Capillary: 131 mg/dL — ABNORMAL HIGH (ref 70–99)

## 2019-08-10 LAB — COMPREHENSIVE METABOLIC PANEL
ALT: 17 U/L (ref 0–44)
AST: 26 U/L (ref 15–41)
Albumin: 3.1 g/dL — ABNORMAL LOW (ref 3.5–5.0)
Alkaline Phosphatase: 51 U/L (ref 38–126)
Anion gap: 12 (ref 5–15)
BUN: 16 mg/dL (ref 6–20)
CO2: 22 mmol/L (ref 22–32)
Calcium: 9.6 mg/dL (ref 8.9–10.3)
Chloride: 84 mmol/L — ABNORMAL LOW (ref 98–111)
Creatinine, Ser: 0.69 mg/dL (ref 0.61–1.24)
GFR calc Af Amer: >60 mL/min (ref >60)
GFR calc non Af Amer: >60 mL/min (ref >60)
Glucose, Bld: 109 mg/dL — ABNORMAL HIGH (ref 70–99)
Potassium: 5 mmol/L (ref 3.5–5.1)
Sodium: 118 mmol/L — CL (ref 135–145)
Total Bilirubin: 0.3 mg/dL (ref 0.3–1.2)
Total Protein: 8.9 g/dL — ABNORMAL HIGH (ref 6.5–8.1)

## 2019-08-10 LAB — CBC
HCT: 41.3 % (ref 39.0–52.0)
Hemoglobin: 14.6 g/dL (ref 13.0–17.0)
MCH: 29.9 pg (ref 26.0–34.0)
MCHC: 35.4 g/dL (ref 30.0–36.0)
MCV: 84.6 fL (ref 80.0–100.0)
Platelets: 321 10*3/uL (ref 150–400)
RBC: 4.88 MIL/uL (ref 4.22–5.81)
RDW: 13.2 % (ref 11.5–15.5)
WBC: 2.7 10*3/uL — ABNORMAL LOW (ref 4.0–10.5)
nRBC: 0 % (ref 0.0–0.2)

## 2019-08-10 LAB — SODIUM
Sodium: 116 mmol/L — CL (ref 135–145)
Sodium: 118 mmol/L — CL (ref 135–145)
Sodium: 119 mmol/L — CL (ref 135–145)
Sodium: 119 mmol/L — CL (ref 135–145)
Sodium: 119 mmol/L — CL (ref 135–145)
Sodium: 124 mmol/L — ABNORMAL LOW (ref 135–145)
Sodium: 121 mmol/L — ABNORMAL LOW (ref 135–145)

## 2019-08-10 LAB — OSMOLALITY
Osmolality: 264 mOsm/kg — ABNORMAL LOW (ref 275–295)
Osmolality: 263 mOsm/kg — ABNORMAL LOW (ref 275–295)

## 2019-08-10 LAB — MRSA PCR SCREENING: MRSA by PCR: NEGATIVE

## 2019-08-10 MED ORDER — SODIUM CHLORIDE 3 % IV SOLN
INTRAVENOUS | Status: DC
  Administered 2019-08-10: 50 mL/h via INTRAVENOUS
  Filled 2019-08-10 (×3): qty 500

## 2019-08-10 MED ORDER — FUROSEMIDE 10 MG/ML IJ SOLN
20.0000 mg | Freq: Two times a day (BID) | INTRAMUSCULAR | Status: DC
  Administered 2019-08-10 – 2019-08-11 (×4): 20 mg via INTRAVENOUS
  Filled 2019-08-10 (×5): qty 2

## 2019-08-10 MED ORDER — FAMOTIDINE IN NACL 20-0.9 MG/50ML-% IV SOLN
20.0000 mg | INTRAVENOUS | Status: DC
  Administered 2019-08-10 – 2019-08-14 (×5): 20 mg via INTRAVENOUS
  Filled 2019-08-10 (×6): qty 50

## 2019-08-10 MED ORDER — SODIUM CHLORIDE 3 % IV SOLN
INTRAVENOUS | Status: DC
  Administered 2019-08-10: 30 mL/h via INTRAVENOUS
  Filled 2019-08-10 (×3): qty 500

## 2019-08-10 MED ORDER — CHLORHEXIDINE GLUCONATE CLOTH 2 % EX PADS
6.0000 | MEDICATED_PAD | Freq: Every day | CUTANEOUS | Status: DC
  Administered 2019-08-10 – 2019-08-18 (×9): 6 via TOPICAL

## 2019-08-10 NOTE — Plan of Care (Signed)
  Problem: Education: Goal: Knowledge of General Education information will improve Description: Including pain rating scale, medication(s)/side effects and non-pharmacologic comfort measures Outcome: Progressing   Problem: Clinical Measurements: Goal: Will remain free from infection Outcome: Progressing   

## 2019-08-10 NOTE — Progress Notes (Signed)
Admit: 07/28/2019 LOS: 12  50M with AIDS, Hyponatremia; Neurosyphilis, CNS lesions  Subjective:  . Progressive encepahlopathy . Not following commands . SNa down to 116-118 . SOsms not as low as expected, but is hypotonic . 0.9 / 1.9 I/O yesterday  Sodium (mmol/L)  Date Value  08/10/2019 118 (LL)  08/10/2019 118 (LL)  08/10/2019 116 (LL)  08/09/2019 116 (LL)  08/09/2019 120 (L)  08/09/2019 120 (L)  08/09/2019 120 (L)  08/08/2019 124 (L)  08/07/2019 125 (L)  08/06/2019 128 (L)     11/13 0701 - 11/14 0700 In: 937.3 [NG/GT:455; IV Piggyback:482.3] Out: 1975 [Urine:1225; Emesis/NG output:300]  Filed Weights   08/02/19 0615 08/08/19 0407 08/09/19 0338  Weight: 55.1 kg 57.7 kg 54.5 kg    Scheduled Meds: . abacavir-dolutegravir-lamiVUDine  1 tablet Oral Daily  . enoxaparin (LOVENOX) injection  40 mg Subcutaneous Q24H  . feeding supplement (OSMOLITE 1.5 CAL)  1,000 mL Per Tube Q24H  . feeding supplement (PRO-STAT SUGAR FREE 64)  30 mL Per Tube BID  . magic mouthwash w/lidocaine  10 mL Oral TID AC & HS  . mouth rinse  15 mL Mouth Rinse BID  . [START ON 08/12/2019] methylPREDNISolone (SOLU-MEDROL) injection  10 mg Intravenous Once  . [START ON 08/11/2019] methylPREDNISolone (SOLU-MEDROL) injection  20 mg Intravenous Daily  . multivitamin with minerals  1 tablet Per Tube Daily  . [START ON 08/13/2019] penicillin g benzathine (BICILLIN-LA) IM  2.4 Million Units Intramuscular Once  . sodium chloride flush  3 mL Intravenous Once  . sodium chloride flush  3 mL Intravenous Q12H   Continuous Infusions: . sodium chloride Stopped (08/09/19 0023)  . chlorproMAZINE (THORAZINE) IV 12.5 mg (08/09/19 1135)  . dexamethasone (DECADRON) IVPB ([facility])    . famotidine (PEPCID) IV    . fluconazole (DIFLUCAN) IV 200 mg (08/09/19 1750)  . metronidazole 500 mg (08/10/19 0128)  . penicillin g continuous IV infusion 12 Million Units (08/10/19 0737)  . sodium chloride (hypertonic)    .  sulfamethoxazole-trimethoprim     PRN Meds:.sodium chloride, acetaminophen **OR** acetaminophen, chlorproMAZINE (THORAZINE) IV, haloperidol lactate, ondansetron **OR** ondansetron (ZOFRAN) IV, phenol, sodium chloride flush  Current Labs: reviewed    Ref. Range 07/28/2019 22:01 08/05/2019 08:00 08/08/2019 18:37  Osmolality, Urine Latest Ref Range: 300 - 900 mOsm/kg 1,024 (H) 558 1,008 (H)  Sodium, Urine Latest Units: mmol/L 111 154 243     Ref. Range 08/08/2019 19:03  Osmolality Latest Ref Range: 275 - 295 mOsm/kg 263 (L)    Physical Exam:  Blood pressure 107/81, pulse 77, temperature (!) 97.4 F (36.3 C), temperature source Axillary, resp. rate 16, height 5\' 11"  (1.803 m), weight 54.5 kg, SpO2 96 %. Encephalopathic/Somnolent, doesn't interact RRR No LEE Soft NGT in place  A 1. Hyponatremia, Euvolemic, Hypotonic: UOsms and UNa both elevated.  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   2. AMS/Encepahlopathy: numerous causes including #1 hard to parse out 3. AIDS 4. CNS lesions, ? Toxo 5. Neurosyphilis  P . WOrsened from yesterday,   . Need to add 3% saline to try to get SNa back up to around 124 in next 24h . Start at 53mL/h, no bolus, titrate to effect . If further drops would bolus 163mL, or if further neuro decompensation . q2h SNa checks . Cont lasix 20 BID IV to try to lower UOsms . Needs very aggressive fluid restriction, no PO and only what comes in IV and trickle T Fs . To ICU . D/w CCM, appreciate their assistance  Sabra Heck MD 08/10/2019, 9:51 AM  Recent Labs  Lab 08/06/19 1821 08/07/19 0447 08/07/19 1652 08/08/19 0329 08/09/19 0333  08/10/19 0317 08/10/19 0626 08/10/19 0820  NA  --  125*  --  124* 120*   < > 116* 118* 118*  K  --  4.5  --  4.7 4.9  --   --  5.0  --   CL  --  99  --  94* 88*  --   --  84*  --   CO2  --  21*  --  23 23  --   --  22  --   GLUCOSE  --  92  --  111* 96  --   --  109*  --   BUN  --  10  --  14 14  --   --  16  --    CREATININE  --  0.63  --  0.74 0.68  --   --  0.69  --   CALCIUM  --  8.6*  --  9.2 9.1  --   --  9.6  --   PHOS 3.5 2.9 3.1  --   --   --   --   --   --    < > = values in this interval not displayed.   Recent Labs  Lab 08/08/19 0329 08/09/19 0333 08/10/19 0626  WBC 2.1* 2.2* 2.7*  HGB 13.3 13.9 14.6  HCT 38.1* 39.5 41.3  MCV 85.0 84.9 84.6  PLT 268 305 321

## 2019-08-10 NOTE — Progress Notes (Signed)
CRITICAL VALUE ALERT  Critical Value:  Sodium 119  Date & Time Notied: 08/10/19 1435  Provider Notified: Carmina Miller, MD   Orders Received/Actions taken: no new orders at this time. Provider asked to be called with critical values IF:  1. Sodium levels DECREASE  2. Sodium levels INCREASE greater than 4 meq

## 2019-08-10 NOTE — Progress Notes (Signed)
PROGRESS NOTE    Arthur [name]  KKX:[id number] DOB: [birth year] DOA: 07/28/2019 PCP: Randall Hiss, MD    Brief Narrative:  Pt with hx/o depression, GERD, substance abusepresented to ED on 07/28/2019 with a complaint of dizziness, weakness, intermittent headache. He has not taken any of his HIV medications for about 1 year due to affordability issue. MRI brain done in ED showed Widespread areas of abnormal low level restricted diffusion and postcontrast enhancement throughout both cerebral hemispheres, predominantly infratentorial, with the dominant abnormality in the LEFT anterior frontal white matter consistent with an opportunistic infection, such as toxoplasmosis, tuberculosis or fungal cerebritis/meningitis, or parasitic infection.  Patient was admitted to hospital service and ID was consulted. He then revealed to [place] that he has lost his vision in his right eye. ID started him on penicillin and ganciclovir for possible CMV retinitis versus synovitis retinitis and ophthalmology also saw him.  07/30/2019-status post lumbar puncture by neurology.  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 intake,mild hypotensive.    Consultants:  Neurology, infectious disease, palliative care, nephrology  Procedures:Lumbar puncture  Antimicrobials: High-dose penicillin for neurosyphilis Bactrim for prophylaxis   Subjective: Patient seen and examined earlier this morning .pt with worsening somnolence this a.m. not really responding or opening his eyes like he did before.  No further vomiting.  Objective: Vitals:   08/09/19 2056 08/10/19 0004 08/10/19 0429 08/10/19 0756  BP: 109/82 (!) 111/93 (!) 109/99 107/81  Pulse: 73 76 71 77  Resp: 18 18 16 16   Temp: 97.8 F (36.6 C)  97.8 F (36.6  C) (!) 97.4 F (36.3 C)  TempSrc: Axillary  Oral Axillary  SpO2: 95% 98% 98% 96%  Weight:      Height:        Intake/Output Summary (Last 24 hours) at 08/10/2019 1056 Last data filed at 08/10/2019 08/12/2019 Gross per 24 hour  Intake 937.29 ml  Output 1975 ml  Net -1037.71 ml   Filed Weights   08/02/19 0615 08/08/19 0407 08/09/19 0338  Weight: 55.1 kg 57.7 kg 54.5 kg    Examination: General exam:appears ill, somnolent, non responding to exam .Doesn't open his eyes when calling his name HEENT: feeding tube in place respiratory system:poor respiratory effort, anteriorly  With rhonchi, no wheezing Cardiovascular system:S1 &S2 heard, RRR. No gallops or rubs or murmurs Gastrointestinal system:Abdomen is nondistended, soft and nontender.Normal bowel sounds heard. Central nervous system:unable to perform exam. Somnolent, nonresponding/cooperative with exam Extremities:No edema Skin:Warm dry  Data Reviewed: I have personally reviewed following labs and imaging studies  CBC: Recent Labs  Lab 08/06/19 0704 08/07/19 0447 08/08/19 0329 08/09/19 0333 08/10/19 0626  WBC 2.0* 2.3* 2.1* 2.2* 2.7*  HGB 11.8* 12.1* 13.3 13.9 14.6  HCT 33.6* 34.5* 38.1* 39.5 41.3  MCV 85.9 86.7 85.0 84.9 84.6  PLT 223 241 268 305 321   Basic Metabolic Panel: Recent Labs  Lab 08/04/19 0332  08/06/19 0321 08/06/19 1444 08/06/19 1821 08/07/19 0447 08/07/19 1652 08/08/19 0329 08/09/19 0333  08/09/19 2107 08/10/19 0317 08/10/19 0626 08/10/19 0820 08/10/19 0931  NA 128*   < > 128*  --   --  125*  --  124* 120*   < > 116* 116* 118* 118* 119*  K 4.6   < > 3.7  --   --  4.5  --  4.7 4.9  --   --   --  5.0  --   --   CL 100   < > 100  --   --  99  --  94* 88*  --   --   --  84*  --   --   CO2 23   < > 21*  --   --  21*  --  23 23  --   --   --  22  --   --   GLUCOSE 123*   < > 97  --   --  92  --  111* 96  --   --   --  109*  --   --   BUN 9   < > 7  --   --  10  --  14 14  --   --   --  16   --   --   CREATININE 0.72   < > 0.63  --   --  0.63  --  0.74 0.68  --   --   --  0.69  --   --   CALCIUM 8.9   < > 8.8*  --   --  8.6*  --  9.2 9.1  --   --   --  9.6  --   --   MG 1.7  --   --  1.6* 1.6* 1.7 1.8  --   --   --   --   --   --   --   --   PHOS 3.1  --   --  2.7 3.5 2.9 3.1  --   --   --   --   --   --   --   --    < > = values in this interval not displayed.   GFR: Estimated Creatinine Clearance: 98.4 mL/min (by C-G formula based on SCr of 0.69 mg/dL). Liver Function Tests: Recent Labs  Lab 08/04/19 0332 08/05/19 0232 08/06/19 0321 08/10/19 0626  AST 21 26 20 26   ALT 14 16 15 17   ALKPHOS 46 53 46 51  BILITOT 0.6 0.5 0.6 0.3  PROT 7.9 8.8* 7.4 8.9*  ALBUMIN 2.7* 3.0* 2.5* 3.1*   No results for input(s): LIPASE, AMYLASE in the last 168 hours. No results for input(s): AMMONIA in the last 168 hours. Coagulation Profile: No results for input(s): INR, PROTIME in the last 168 hours. Cardiac Enzymes: No results for input(s): CKTOTAL, CKMB, CKMBINDEX, TROPONINI in the last 168 hours. BNP (last 3 results) No results for input(s): PROBNP in the last 8760 hours. HbA1C: No results for input(s): HGBA1C in the last 72 hours. CBG: Recent Labs  Lab 08/09/19 1630 08/09/19 2033 08/10/19 0011 08/10/19 0425 08/10/19 0750  GLUCAP 132* 126* 126* 111* 103*   Lipid Profile: No results for input(s): CHOL, HDL, LDLCALC, TRIG, CHOLHDL, LDLDIRECT in the last 72 hours. Thyroid Function Tests: Recent Labs    08/08/19 1903  TSH 0.792   Anemia Panel: No results for input(s): VITAMINB12, FOLATE, FERRITIN, TIBC, IRON, RETICCTPCT in the last 72 hours. Sepsis Labs: Recent Labs  Lab 08/07/19 2350 08/08/19 0329  LATICACIDVEN 0.6 1.1    No results found for this or any previous visit (from the past 240 hour(s)).       Radiology Studies: Dg Chest Port 1 View  Result Date: 08/09/2019 CLINICAL DATA:  Low oxygen saturation. Possible aspiration. NG tube placement. EXAM:  PORTABLE CHEST 1 VIEW COMPARISON:  08/03/2019 FINDINGS: Enteric tube in place with  tip below the diaphragm, not included in the field of view. Normal heart size and mediastinal contours. No focal airspace disease, pulmonary edema, or pleural effusion. No pneumothorax. No acute osseous abnormalities are seen. Patient is slightly rotated. IMPRESSION: 1. Enteric tube in place with tip below the diaphragm, not included in the field of view. 2. No acute pulmonary findings. Electronically Signed   By: Narda Rutherford M.D.   On: 08/09/2019 15:14        Scheduled Meds: . abacavir-dolutegravir-lamiVUDine  1 tablet Oral Daily  . enoxaparin (LOVENOX) injection  40 mg Subcutaneous Q24H  . feeding supplement (OSMOLITE 1.5 CAL)  1,000 mL Per Tube Q24H  . feeding supplement (PRO-STAT SUGAR FREE 64)  30 mL Per Tube BID  . furosemide  20 mg Intravenous BID  . magic mouthwash w/lidocaine  10 mL Oral TID AC & HS  . mouth rinse  15 mL Mouth Rinse BID  . [START ON 08/12/2019] methylPREDNISolone (SOLU-MEDROL) injection  10 mg Intravenous Once  . [START ON 08/11/2019] methylPREDNISolone (SOLU-MEDROL) injection  20 mg Intravenous Daily  . multivitamin with minerals  1 tablet Per Tube Daily  . [START ON 08/13/2019] penicillin g benzathine (BICILLIN-LA) IM  2.4 Million Units Intramuscular Once  . sodium chloride flush  3 mL Intravenous Once  . sodium chloride flush  3 mL Intravenous Q12H   Continuous Infusions: . sodium chloride Stopped (08/09/19 0023)  . chlorproMAZINE (THORAZINE) IV 12.5 mg (08/09/19 1135)  . dexamethasone (DECADRON) IVPB ([facility])    . famotidine (PEPCID) IV    . fluconazole (DIFLUCAN) IV 200 mg (08/09/19 1750)  . metronidazole 500 mg (08/10/19 1037)  . penicillin g continuous IV infusion 12 Million Units (08/10/19 0737)  . sodium chloride (hypertonic)    . 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 TB gold plus negative.MTB probenegative ID following-recommend continuing penicillin for 14 days  RIPEwas stopped  AMS/encephlopathy- Today not as repsonding as he did yesterday. Multiple causes, but now with worsening Na level. Per nephrology pt needs more aggressive mx for his hyponatremia, will need to transfer to ICU.  Intensivist has been notified -obtain UOsms down -start Lasix 20 BID -Fluid restrict very aggressively -no PO and only what comes in IV -Serial SNa levels -If worsens further, nephrology will initiate 3% Saline  vs Vaptan -ck AM UOsm and SOsm   Right eye blindless/Optic retinitis/neurititis mainly concerned between syphilis and CMV-butCMV PCR negative in the CNS. Received steroid injection 08/02/2019 Receivedethambutol, isoniazid, rifampicin and pyridoxine, pyrazinamide for tuberculosis-but then discontinued Ganciclovir forCMVwas d/c'd now with negative CNS PCR Bactrim for prophylaxis. -Haldol and bedside sitter for safety and fall risk. Repeat MRIbrain and orbits show improvement of brain lesions and optic neurititis, retinitis. 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  cxr did not reveal aspiration pna.  HOB elevated Place on aspiration precautions TF was started at 70ml/hr     Leukopenia/immunocompromised in the setting of untreated HIV WBC 2.7>>2.1 Continue to closely monitor  Untreated HIV started the [facility] ID following   Hypomagnesemia Replaced and stable   Acute metabolic encephalopathy: Due to #1. As needed Ativan and Haldol.  Neurology was following   Dysphagia Seen by speech therapist N.p.o. [facility] consulted for placement Dietitian for PEG tube feeding--->was held part of the time yesterday due to vomiting  Severe protein calorie malnutrition Albumin 2.7 with BMI 16 Dietitian consulted following diet. cortrack tubeplacement on 08/06/2019.  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 Treating as above.  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 Seen by palliative care. Currently recommending continuing aggressive measures.   DVT prophylaxis:Lovenox subcu daily Code Status:Full code Family Communication:None Disposition Plan:Pt will be transferred to ICU for further mx of his severe hyponatremia.     LOS: 12 days  Time spent: 45 minutes with more than 50% on [place], MD [facility] Pager [phone]  If 7PM-7AM, please contact night-coverage [email or web address] Password [email or web address] 08/10/2019, 10:56 AM

## 2019-08-10 NOTE — Consult Note (Addendum)
NAME:  Arthur [name], MRN:  [id number], DOB:  24-Apr-1981, LOS: 12 ADMISSION DATE:  07/28/2019, CONSULTATION DATE:  08/10/19 REFERRING MD:  Marisue Humble, CHIEF COMPLAINT:  dizziness   Brief History   38 year old man with AIDs presenting with worsening dizziness found to have neurosyphillis, CNS toxoplasmosis.  Hospital course complicated by worsening hyponatremia, respiratory decline prompting transfer to ICU.  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  Hospital course complicated by worsening hyponatremia, respiratory decline prompting transfer to ICU.  History per chart review as patient is nearly obtunded.   Apparetnly waxing/waning mental status, occasional bouts of N/V, increasing O2 requirements raises concern for aspiration events.  Sodium slowly dropping in setting of euvolemia and inappropriately elevated urine osms c/w SIADH.  PCCM asked to help manage developing respiratory failure.  Past Medical History  AIDS  Significant Hospital Events   11/1 admitted 11/14 transfer to ICU  Consults:  PCCM, Nephro, Neuro, ID  Procedures:  11/3 LP  Significant Diagnostic Tests:  LP: >600 protein, 64 WBC, 96% lymph, O RBC, VDRL 1:64, Toxoplasma +, crypto neg, HSV neg, Tb probe neg, bacterial cult neg Utox cannibis CD4 35, HIV 73k   Antimicrobials:  Abacavir/dolutegravis/lamivudine (Triumeq) >> Fluconazole 11/7>> Flagyl 11/13>> PCN 11/1 >> Bactrim ppx  Interim history/subjective:  Consulted  Objective   Blood pressure 107/81, pulse 77, temperature (!) 97.4 F (36.3 C), temperature source Axillary, resp. rate 16, height 5\' 11"  (1.803 m), weight 54.5 kg, SpO2 96 %.        Intake/Output Summary (Last 24 hours) at 08/10/2019 0845 Last data filed at 08/10/2019 08/12/2019 Gross per 24 hour  Intake 937.29 ml  Output 1975 ml  Net -1037.71 ml   Filed Weights   08/02/19 0615 08/08/19 0407  08/09/19 0338  Weight: 55.1 kg 57.7 kg 54.5 kg    Examination: General: ill appearing man in bed HENT: MMM, no obvious thrush Lungs: Scattered rhonci, no accessory muscle use Cardiovascular: RRR, ext warm Abdomen: Soft, +BS Extremities: +muscle asting Neuro: Withdraws to pain, has cough, opens eyes to pain Skin: no rashes  Resolved Hospital Problem list     Assessment & Plan:  # Encephalopathy- multifactorial related to neurosyphllis and worsening hyponatremia, protecting airway at present # Acute hypoxemic respiratory failure- nothing obivous on CXR, suspect related to secretion clearance issues in upper airway # Abnormal MRI- working diagnosis of neurosyphillis given response to current therapy # 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, with mental status, warrants more aggressive correction  - ICU transfer - Close respiratory status monitoring - Defer to ID on all antimicrobials and steroids - Defer to nephro on hypertonic saline, aim for 0.5 mEq/dL/hr - Continue lasix per nephrology  Best practice:  Diet: NPO Pain/Anxiety/Delirium protocol (if indicated): Hold all sedating med VAP protocol (if indicated): NA DVT prophylaxis: lovenox GI prophylaxis: pepcid Glucose control: SSI Mobility: BR Code Status: Full Disposition: ICU  Labs   CBC: Recent Labs  Lab 08/06/19 0704 08/07/19 0447 08/08/19 0329 08/09/19 0333 08/10/19 0626  WBC 2.0* 2.3* 2.1* 2.2* 2.7*  HGB 11.8* 12.1* 13.3 13.9 14.6  HCT 33.6* 34.5* 38.1* 39.5 41.3  MCV 85.9 86.7 85.0 84.9 84.6  PLT 223 241 268 305 321    Basic Metabolic Panel: Recent Labs  Lab 08/04/19 0332  08/06/19 0321 08/06/19 1444 08/06/19 1821 08/07/19 0447 08/07/19 1652 08/08/19 0329 08/09/19 0333 08/09/19 1341 08/09/19  1652 08/09/19 2107 08/10/19 0317 08/10/19 0626  NA 128*   < > 128*  --   --  125*  --  124* 120* 120* 120* 116* 116* 118*  K 4.6   < > 3.7  --   --  4.5  --  4.7 4.9  --   --    --   --  5.0  CL 100   < > 100  --   --  99  --  94* 88*  --   --   --   --  84*  CO2 23   < > 21*  --   --  21*  --  23 23  --   --   --   --  22  GLUCOSE 123*   < > 97  --   --  92  --  111* 96  --   --   --   --  109*  BUN 9   < > 7  --   --  10  --  14 14  --   --   --   --  16  CREATININE 0.72   < > 0.63  --   --  0.63  --  0.74 0.68  --   --   --   --  0.69  CALCIUM 8.9   < > 8.8*  --   --  8.6*  --  9.2 9.1  --   --   --   --  9.6  MG 1.7  --   --  1.6* 1.6* 1.7 1.8  --   --   --   --   --   --   --   PHOS 3.1  --   --  2.7 3.5 2.9 3.1  --   --   --   --   --   --   --    < > = values in this interval not displayed.   GFR: Estimated Creatinine Clearance: 98.4 mL/min (by C-G formula based on SCr of 0.69 mg/dL). Recent Labs  Lab 08/07/19 0447 08/07/19 2350 08/08/19 0329 08/09/19 0333 08/10/19 0626  WBC 2.3*  --  2.1* 2.2* 2.7*  LATICACIDVEN  --  0.6 1.1  --   --     Liver Function Tests: Recent Labs  Lab 08/04/19 0332 08/05/19 0232 08/06/19 0321 08/10/19 0626  AST 21 26 20 26   ALT 14 16 15 17   ALKPHOS 46 53 46 51  BILITOT 0.6 0.5 0.6 0.3  PROT 7.9 8.8* 7.4 8.9*  ALBUMIN 2.7* 3.0* 2.5* 3.1*   No results for input(s): LIPASE, AMYLASE in the last 168 hours. No results for input(s): AMMONIA in the last 168 hours.  ABG No results found for: PHART, PCO2ART, PO2ART, HCO3, TCO2, ACIDBASEDEF, O2SAT   Coagulation Profile: No results for input(s): INR, PROTIME in the last 168 hours.  Cardiac Enzymes: No results for input(s): CKTOTAL, CKMB, CKMBINDEX, TROPONINI in the last 168 hours.  HbA1C: No results found for: HGBA1C  CBG: Recent Labs  Lab 08/09/19 1630 08/09/19 2033 08/10/19 0011 08/10/19 0425 08/10/19 0750  GLUCAP 132* 126* 126* 111* 103*    Review of Systems:   Cannot assess due to mental status  Past Medical History  He,  has a past medical history of Anemia, Anxiety, Depression, GERD (gastroesophageal reflux disease), HIV infection (HCC), and  Substance abuse (HCC).   Surgical History    Past Surgical History:  Procedure Laterality Date  . TOOTH EXTRACTION       Social History   reports that he has been smoking. He has a 6.80 pack-year smoking history. He has never used smokeless tobacco. He reports current drug use. Drug: Marijuana. He reports that he does not drink alcohol.   Family History   His family history is negative for Diabetes, CAD, and Hypertension.   Allergies No Known Allergies   Home Medications  Prior to Admission medications   Medication Sig Start Date End Date Taking? Authorizing Provider  ondansetron (ZOFRAN ODT) 8 MG disintegrating tablet Take 1 tablet (8 mg total) by mouth every 8 (eight) hours as needed for nausea or vomiting. 07/26/19   Pattricia Boss, MD  ritonavir (NORVIR) 100 MG TABS tablet Take 1 tablet (100 mg total) by mouth daily. Patient not taking: Reported on 01/22/2018 02/06/14   Tommy Medal, Lavell Islam, MD  TRUVADA 200-300 MG per tablet TAKE 1 TABLET BY MOUTH DAILY Patient not taking: Reported on 01/22/2018 05/12/14   Tommy Medal, Lavell Islam, MD     Critical care time: 32 minutes

## 2019-08-10 NOTE — Progress Notes (Signed)
    [facility] for Infectious Disease   Reason for visit: Follow up on CNS infection, AIDS.    Interval History: now in ICU, more somnolent, sodium worse.  Does not respond much but does shake his head.    Physical Exam: Constitutional:  Vitals:   08/10/19 0429 08/10/19 0756  BP: (!) 109/99 107/81  Pulse: 71 77  Resp: 16 16  Temp: 97.8 F (36.6 C) (!) 97.4 F (36.3 C)  SpO2: 98% 96%   patient appears in NAD Respiratory: Normal respiratory effort; CTA B Cardiovascular: RRR GI: soft, nt, nd MS: no edema Neuro: does not respond to questions  Review of Systems: Unable to be assessed due to patient factors  Lab Results  Component Value Date   WBC 2.7 (L) 08/10/2019   HGB 14.6 08/10/2019   HCT 41.3 08/10/2019   MCV 84.6 08/10/2019   PLT 321 08/10/2019    Lab Results  Component Value Date   CREATININE 0.69 08/10/2019   BUN 16 08/10/2019   NA 119 (LL) 08/10/2019   K 5.0 08/10/2019   CL 84 (L) 08/10/2019   CO2 22 08/10/2019    Lab Results  Component Value Date   ALT 17 08/10/2019   AST 26 08/10/2019   ALKPHOS 51 08/10/2019     Microbiology: No results found for this or any previous visit (from the past 240 hour(s)).  Impression/Plan:  1. CNS lesions.  Waxing and waning mental status likely part due to hyponatremia.  Will continue to monitor.   2. Neurosyphilis - radiographic improvement and remains on penicillin through 11/17.    3. Hyponatremia - in ICU now for hypertonic saline.  Partly an issue with IV penicillin.  Appreciate renal help.

## 2019-08-10 NOTE — Progress Notes (Addendum)
Stanford MD called to notify this RN to stop 3% NA due to increase of sodium from 119 to 124 within the last 4 hours. Fluids stopped per MD order. Will continue to monitor

## 2019-08-10 NOTE — Progress Notes (Signed)
Repeat Na level is 116. Provider notified.

## 2019-08-11 LAB — SODIUM
Sodium: 125 mmol/L — ABNORMAL LOW (ref 135–145)
Sodium: 125 mmol/L — ABNORMAL LOW (ref 135–145)
Sodium: 125 mmol/L — ABNORMAL LOW (ref 135–145)
Sodium: 124 mmol/L — ABNORMAL LOW (ref 135–145)
Sodium: 124 mmol/L — ABNORMAL LOW (ref 135–145)
Sodium: 124 mmol/L — ABNORMAL LOW (ref 135–145)

## 2019-08-11 LAB — OSMOLALITY: Osmolality: 265 mOsm/kg — ABNORMAL LOW (ref 275–295)

## 2019-08-11 LAB — GLUCOSE, CAPILLARY
Glucose-Capillary: 92 mg/dL (ref 70–99)
Glucose-Capillary: 108 mg/dL — ABNORMAL HIGH (ref 70–99)
Glucose-Capillary: 153 mg/dL — ABNORMAL HIGH (ref 70–99)
Glucose-Capillary: 136 mg/dL — ABNORMAL HIGH (ref 70–99)
Glucose-Capillary: 120 mg/dL — ABNORMAL HIGH (ref 70–99)

## 2019-08-11 LAB — OSMOLALITY, URINE: Osmolality, Ur: 352 mOsm/kg (ref 300–900)

## 2019-08-11 MED ORDER — SULFAMETHOXAZOLE-TRIMETHOPRIM 400-80 MG PO TABS
3.0000 | ORAL_TABLET | Freq: Three times a day (TID) | ORAL | Status: DC
  Administered 2019-08-11 – 2019-08-12 (×3): 3 via ORAL
  Filled 2019-08-11 (×4): qty 3

## 2019-08-11 NOTE — Progress Notes (Signed)
    [facility] for Infectious Disease   Reason for visit: Follow up on CNS infection, AIDS.    Interval History: remains in ICU; Na stable at 125.  No acute events.  No interaction otherwise  Physical Exam: Constitutional:  Vitals:   08/11/19 1146 08/11/19 1200  BP:  101/68  Pulse:  72  Resp:  19  Temp: 97.7 F (36.5 C)   SpO2:  92%   patient appears in NAD Respiratory: Normal respiratory effort; CTA B Cardiovascular: RRR GI: soft, nt, nd MS: no edema Neuro: does not respond to questions  Review of Systems: Unable to be assessed due to patient factors  Lab Results  Component Value Date   WBC 2.7 (L) 08/10/2019   HGB 14.6 08/10/2019   HCT 41.3 08/10/2019   MCV 84.6 08/10/2019   PLT 321 08/10/2019    Lab Results  Component Value Date   CREATININE 0.69 08/10/2019   BUN 16 08/10/2019   NA 125 (L) 08/11/2019   K 5.0 08/10/2019   CL 84 (L) 08/10/2019   CO2 22 08/10/2019    Lab Results  Component Value Date   ALT 17 08/10/2019   AST 26 08/10/2019   ALKPHOS 51 08/10/2019     Microbiology: Recent Results (from the past 240 hour(s))  MRSA PCR Screening     Status: None   Collection Time: 08/10/19  1:35 PM   Specimen: Nasopharyngeal  Result Value Ref Range Status   MRSA by PCR NEGATIVE NEGATIVE Final    Comment:        The GeneXpert MRSA Assay (FDA approved for NASAL specimens only), is one component of a comprehensive MRSA colonization surveillance program. It is not intended to diagnose MRSA infection nor to guide or monitor treatment for MRSA infections. Performed at [facility], [place] [address] [place], [place] [address]     Impression/Plan:  1. CNS lesions.  Has neurosyphilis and now toxo PCR in CSF positive concerning for CNS Toxoplasmosis.   I will start Bactrim  2. Neurosyphilis - on treatment through 11/17   3. Hyponatremia - stable but harder to control with IV penicillin.  Hopefully once he completes the course, this will  stablilize.    Dr. Baxter Flattery on tomorrow.

## 2019-08-11 NOTE — Progress Notes (Signed)
Notes from consultants reviewed: should be fine for stepdown if we need a bed otherwise will transfer him out in AM to assure does not need more hypertonic saline.  Erskine Emery MD

## 2019-08-11 NOTE — Progress Notes (Signed)
   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  Hospital course complicated by worsening hyponatremia, respiratory decline prompting transfer to ICU.  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  Hospital course complicated by worsening hyponatremia, respiratory decline prompting transfer to ICU.  History per chart review as patient is nearly obtunded.   Apparetnly waxing/waning mental status, occasional bouts of N/V, increasing O2 requirements raises concern for aspiration events.  Sodium slowly dropping in setting of euvolemia and inappropriately elevated urine osms c/w SIADH.  PCCM asked to help manage developing respiratory failure.  Past Medical History  [place] [facility] Events   11/1 admitted 11/14 transfer to ICU  Consults:  PCCM, Nephro, Neuro, ID  Procedures:  11/3 LP  Significant Diagnostic Tests:  LP: >600 protein, 64 WBC, 96% lymph, O RBC, VDRL 1:64, Toxoplasma +, crypto neg, HSV neg, Tb probe neg, bacterial cult neg Utox cannibis CD4 35, HIV 73k   Antimicrobials:  Abacavir/dolutegravis/lamivudine (Triumeq) >> Fluconazole 11/7>> Flagyl 11/13>> PCN 11/1 >> Bactrim ppx  Interim history/subjective:  No events, patient more awake today.  Nods head no when asked about pain or shortness of breath.  Objective   Blood pressure 92/64, pulse 77, temperature 97.8 F (36.6 C), temperature source Oral, resp. rate 17, height 5\' 11"  (1.803 m), weight 54.5 kg, SpO2 96 %.        Intake/Output Summary (Last 24 hours) at 08/11/2019 0817 Last data filed at 08/11/2019 0700 Gross per 24 hour  Intake 1660 ml  Output  1500 ml  Net 160 ml   Filed Weights   08/02/19 0615 08/08/19 0407 08/09/19 0338  Weight: 55.1 kg 57.7 kg 54.5 kg    Examination: General: ill appearing man in bed HENT: MMM, no obvious thrush Lungs: better air movement today, no accessory muscle use Cardiovascular: RRR, ext warm Abdomen: Soft, +BS Extremities: +muscle wasting Neuro: opens eyes to voice, nods head appropriately, withdraws to pain but cannot get him to follow motor commands Skin: no rashes  Resolved Hospital Problem list     Assessment & Plan:  # Encephalopathy- multifactorial related to neurosyphllis and worsening hyponatremia, improved today with treating latter # Acute hypoxemic respiratory failure-related to secretion clearance issues in upper airway, improved # Abnormal MRI- working diagnosis of neurosyphillis given response to current therapy # 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  - Hyponatremia management per nephrology - Defer to ID on all antimicrobials and steroids - Patient looks improved, should be okay for transfer to stepdown whenever we have decided he no longer needs hypertonic saline, will call out to [facility] once this has been determined  Best practice:  Diet: TF Pain/Anxiety/Delirium protocol (if indicated): Hold all sedating meds VAP protocol (if indicated): NA DVT prophylaxis: lovenox GI prophylaxis: pepcid Glucose control: SSI Mobility: BR Code Status: Full Disposition: SDU pending hypertonic saline needs  Erskine Emery MD PCCM

## 2019-08-11 NOTE — Progress Notes (Signed)
Admit: 07/28/2019 LOS: 13  51M with AIDS, Hyponatremia; Neurosyphilis, CNS lesions  Subjective:  [name] More awake alert and interactive today . Na inc to 125 . On 3% Saline at 59mL/h . 24h I/o 1.7/1.5  Sodium (mmol/L)  Date Value  08/11/2019 125 (L)  08/11/2019 125 (L)  08/11/2019 125 (L)  08/10/2019 121 (L)  08/10/2019 124 (L)  08/10/2019 119 (LL)  08/10/2019 119 (LL)  08/10/2019 119 (LL)  08/10/2019 118 (LL)  08/10/2019 118 (LL)     11/14 0701 - 11/15 0700 In: 1670 [I.V.:617.8; NG/GT:140; IV Piggyback:912.2] Out: 1500 [Urine:1500]  Filed Weights   08/02/19 0615 08/08/19 0407 08/09/19 0338  Weight: 55.1 kg 57.7 kg 54.5 kg    Scheduled Meds: . abacavir-dolutegravir-lamiVUDine  1 tablet Oral Daily  . Chlorhexidine Gluconate Cloth  6 each Topical Daily  . enoxaparin (LOVENOX) injection  40 mg Subcutaneous Q24H  . feeding supplement (OSMOLITE 1.5 CAL)  1,000 mL Per Tube Q24H  . feeding supplement (PRO-STAT SUGAR FREE 64)  30 mL Per Tube BID  . furosemide  20 mg Intravenous BID  . magic mouthwash w/lidocaine  10 mL Oral TID AC & HS  . mouth rinse  15 mL Mouth Rinse BID  . [START ON 08/12/2019] methylPREDNISolone (SOLU-MEDROL) injection  10 mg Intravenous Once  . methylPREDNISolone (SOLU-MEDROL) injection  20 mg Intravenous Daily  . multivitamin with minerals  1 tablet Per Tube Daily  . [START ON 08/13/2019] penicillin g benzathine (BICILLIN-LA) IM  2.4 Million Units Intramuscular Once  . sodium chloride flush  3 mL Intravenous Once  . sodium chloride flush  3 mL Intravenous Q12H   Continuous Infusions: . sodium chloride Stopped (08/09/19 0023)  . chlorproMAZINE (THORAZINE) IV 12.5 mg (08/09/19 1135)  . famotidine (PEPCID) IV 100 mL/hr at 08/11/19 1000  . fluconazole (DIFLUCAN) IV Stopped (08/10/19 1735)  . metronidazole 100 mL/hr at 08/11/19 1000  . penicillin g continuous IV infusion 12 Million Units (08/11/19 0346)  . sodium chloride (hypertonic) Stopped (08/11/19  0745)  . sulfamethoxazole-trimethoprim 160 mg (08/11/19 1043)   PRN Meds:.sodium chloride, acetaminophen **OR** acetaminophen, chlorproMAZINE (THORAZINE) IV, haloperidol lactate, ondansetron **OR** ondansetron (ZOFRAN) IV, phenol, sodium chloride flush  Current Labs: reviewed    Ref. Range 07/28/2019 22:01 08/05/2019 08:00 08/08/2019 18:37  Osmolality, Urine Latest Ref Range: 300 - 900 mOsm/kg 1,024 (H) 558 1,008 (H)  Sodium, Urine Latest Units: mmol/L 111 154 243     Ref. Range 08/08/2019 19:03  Osmolality Latest Ref Range: 275 - 295 mOsm/kg 263 (L)    Physical Exam:  Blood pressure (!) 84/63, pulse 79, temperature 97.8 F (36.6 C), temperature source Oral, resp. rate (!) 21, height 5\' 11"  (1.803 m), weight 54.5 kg, SpO2 100 %. Encephalopathic/Somnolent, doesn't interact RRR No LEE Soft NGT in place  A 1. Hyponatremia, Euvolemic, Hypotonic: UOsms and UNa both elevated.  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   2. AMS/Encepahlopathy: numerous causes including #1 hard to parse out 3. AIDS 4. CNS lesions, ? Toxo 5. Neurosyphilis  P . Clinically improved after partial correction of hyponatremia . Will stop 3% saline today, cont to trend SNa, fluid restriction from here might be enough . q2h SNa checks . Cont lasix 20 BID IV to try to lower UOsms . Rpt UOsm today . Cont aggressive fluid restriction, no PO and only what comes in IV and trickle T MD 08/11/2019, 10:45 AM  Recent Labs  Lab 08/06/19 1821 08/07/19 0447 08/07/19 1652 08/08/19  7353 08/09/19 0333  08/10/19 0626  08/11/19 0239 08/11/19 0615 08/11/19 0926  NA  --  125*  --  124* 120*   < > 118*   < > 125* 125* 125*  K  --  4.5  --  4.7 4.9  --  5.0  --   --   --   --   CL  --  99  --  94* 88*  --  84*  --   --   --   --   CO2  --  21*  --  23 23  --  22  --   --   --   --   GLUCOSE  --  92  --  111* 96  --  109*  --   --   --   --   BUN  --  10  --  14 14  --  16  --   --   --   --   CREATININE   --  0.63  --  0.74 0.68  --  0.69  --   --   --   --   CALCIUM  --  8.6*  --  9.2 9.1  --  9.6  --   --   --   --   PHOS 3.5 2.9 3.1  --   --   --   --   --   --   --   --    < > = values in this interval not displayed.   Recent Labs  Lab 08/08/19 0329 08/09/19 0333 08/10/19 0626  WBC 2.1* 2.2* 2.7*  HGB 13.3 13.9 14.6  HCT 38.1* 39.5 41.3  MCV 85.0 84.9 84.6  PLT 268 305 321

## 2019-08-11 NOTE — TOC Progression Note (Signed)
Transition of Care [facility]) - Progression Note    Patient Details  Name: Arthur [name] MRN: [id number] Date of Birth: 06-16-1981  Transition of Care [facility]) CM/SW [place], [place] Phone Number: 08/11/2019, 8:40 AM  Clinical Narrative:    TOC team continues to follow for pt disposition. Pt currently not medically stable.    Expected Discharge Plan: [place] Barriers to Discharge: Continued Medical Work up  Expected Discharge Plan and Services Expected Discharge Plan: [place] In-house Referral: NA Post Acute Care Choice: [place] arrangements for the past 2 months: Single Family Home HH Arranged: PT, OT HH Agency: [facility] Date [place]: 07/30/19 Time [place]: 1442 Representative spoke with at [place]: [name]   Social Determinants of Health ([place]) Interventions    Readmission Risk Interventions No flowsheet data found.

## 2019-08-11 NOTE — Progress Notes (Signed)
Pharmacy Antibiotic Note  Arthur [name] is a 38 y.o. male admitted on 07/28/2019 with concern for CNS toxoplasmosis.  Pharmacy has been consulted for Bactrim dosing.  Plan: Will dose trimethoprim at 5 mg/kg (sulfamethoxazole 25 mg/kg) q 8hrs.  Due to tablet strengths available, closest approximation is 3 single-strength tabs q 8 hrs (trimethoprim 240 mg/sulfamethoxazole 1200).  Height: 5\' 11"  (180.3 cm) Weight: 120 lb 2.4 oz (54.5 kg) IBW/kg (Calculated) : 75.3  Temp (24hrs), Avg:97.5 F (36.4 C), Min:97.2 F (36.2 C), Max:97.8 F (36.6 C)  Recent Labs  Lab 08/06/19 0321 08/06/19 0704 08/07/19 0447 08/07/19 2350 08/08/19 0329 08/09/19 0333 08/10/19 0626  WBC  --  2.0* 2.3*  --  2.1* 2.2* 2.7*  CREATININE 0.63  --  0.63  --  0.74 0.68 0.69  LATICACIDVEN  --   --   --  0.6 1.1  --   --     Estimated Creatinine Clearance: 98.4 mL/min (by C-G formula based on SCr of 0.69 mg/dL).    No Known Allergies  (+) VDRL 1:64 (+) CMV serum - 262  (+) RPR with high titer 1:128 (never treated) (+) serum Toxo IgG > 400 CMV DNA in CSF 138, 693  Thank you for allowing pharmacy to be a part of this patient's care.  Marguerite Olea, [facility] Clinical Pharmacist Phone [phone]  08/11/2019 1:59 PM

## 2019-08-12 DIAGNOSIS — Z7189 Other specified counseling: Secondary | ICD-10-CM

## 2019-08-12 DIAGNOSIS — B582 Toxoplasma meningoencephalitis: Secondary | ICD-10-CM

## 2019-08-12 DIAGNOSIS — B589 Toxoplasmosis, unspecified: Secondary | ICD-10-CM

## 2019-08-12 DIAGNOSIS — E43 Unspecified severe protein-calorie malnutrition: Secondary | ICD-10-CM

## 2019-08-12 LAB — CBC WITH DIFFERENTIAL/PLATELET
Abs Immature Granulocytes: 0.02 10*3/uL (ref 0.00–0.07)
Basophils Absolute: 0 10*3/uL (ref 0.0–0.1)
Basophils Relative: 0 %
Eosinophils Absolute: 0.1 10*3/uL (ref 0.0–0.5)
Eosinophils Relative: 2 %
HCT: 40.9 % (ref 39.0–52.0)
Hemoglobin: 14.5 g/dL (ref 13.0–17.0)
Immature Granulocytes: 1 %
Lymphocytes Relative: 29 %
Lymphs Abs: 0.7 10*3/uL (ref 0.7–4.0)
MCH: 30.1 pg (ref 26.0–34.0)
MCHC: 35.5 g/dL (ref 30.0–36.0)
MCV: 84.9 fL (ref 80.0–100.0)
Monocytes Absolute: 0.3 10*3/uL (ref 0.1–1.0)
Monocytes Relative: 10 %
Neutro Abs: 1.5 10*3/uL — ABNORMAL LOW (ref 1.7–7.7)
Neutrophils Relative %: 58 %
Platelets: 334 10*3/uL (ref 150–400)
RBC: 4.82 MIL/uL (ref 4.22–5.81)
RDW: 13.3 % (ref 11.5–15.5)
WBC: 2.6 10*3/uL — ABNORMAL LOW (ref 4.0–10.5)
nRBC: 0 % (ref 0.0–0.2)

## 2019-08-12 LAB — COMPREHENSIVE METABOLIC PANEL
ALT: 18 U/L (ref 0–44)
AST: 25 U/L (ref 15–41)
Albumin: 3.1 g/dL — ABNORMAL LOW (ref 3.5–5.0)
Alkaline Phosphatase: 45 U/L (ref 38–126)
Anion gap: 15 (ref 5–15)
BUN: 16 mg/dL (ref 6–20)
CO2: 22 mmol/L (ref 22–32)
Calcium: 9.6 mg/dL (ref 8.9–10.3)
Chloride: 88 mmol/L — ABNORMAL LOW (ref 98–111)
Creatinine, Ser: 1.05 mg/dL (ref 0.61–1.24)
GFR calc Af Amer: >60 mL/min (ref >60)
GFR calc non Af Amer: >60 mL/min (ref >60)
Glucose, Bld: 108 mg/dL — ABNORMAL HIGH (ref 70–99)
Potassium: 4.5 mmol/L (ref 3.5–5.1)
Sodium: 125 mmol/L — ABNORMAL LOW (ref 135–145)
Total Bilirubin: 0.3 mg/dL (ref 0.3–1.2)
Total Protein: 8.7 g/dL — ABNORMAL HIGH (ref 6.5–8.1)

## 2019-08-12 LAB — SODIUM
Sodium: 124 mmol/L — ABNORMAL LOW (ref 135–145)
Sodium: 122 mmol/L — ABNORMAL LOW (ref 135–145)
Sodium: 124 mmol/L — ABNORMAL LOW (ref 135–145)
Sodium: 121 mmol/L — ABNORMAL LOW (ref 135–145)
Sodium: 122 mmol/L — ABNORMAL LOW (ref 135–145)

## 2019-08-12 LAB — GLUCOSE, CAPILLARY
Glucose-Capillary: 122 mg/dL — ABNORMAL HIGH (ref 70–99)
Glucose-Capillary: 91 mg/dL (ref 70–99)
Glucose-Capillary: 94 mg/dL (ref 70–99)
Glucose-Capillary: 124 mg/dL — ABNORMAL HIGH (ref 70–99)
Glucose-Capillary: 87 mg/dL (ref 70–99)
Glucose-Capillary: 112 mg/dL — ABNORMAL HIGH (ref 70–99)

## 2019-08-12 MED ORDER — FUROSEMIDE 10 MG/ML IJ SOLN
40.0000 mg | Freq: Two times a day (BID) | INTRAMUSCULAR | Status: DC
  Administered 2019-08-12 – 2019-08-13 (×3): 40 mg via INTRAVENOUS
  Filled 2019-08-12 (×3): qty 4

## 2019-08-12 MED ORDER — PRO-STAT SUGAR FREE PO LIQD
30.0000 mL | Freq: Three times a day (TID) | ORAL | Status: DC
  Administered 2019-08-12 – 2019-08-19 (×18): 30 mL
  Filled 2019-08-12 (×18): qty 30

## 2019-08-12 MED ORDER — METHYLPREDNISOLONE SODIUM SUCC 40 MG IJ SOLR
10.0000 mg | Freq: Once | INTRAMUSCULAR | Status: AC
  Administered 2019-08-13: 10 mg via INTRAVENOUS
  Filled 2019-08-12: qty 1

## 2019-08-12 MED ORDER — SULFAMETHOXAZOLE-TRIMETHOPRIM 400-80 MG/5ML IV SOLN
10.0000 mg/kg/d | Freq: Two times a day (BID) | INTRAVENOUS | Status: DC
  Administered 2019-08-12 – 2019-08-15 (×7): 272.48 mg via INTRAVENOUS
  Filled 2019-08-12 (×10): qty 17.03

## 2019-08-12 MED ORDER — OSMOLITE 1.5 CAL PO LIQD
1000.0000 mL | ORAL | Status: DC
  Administered 2019-08-13 – 2019-08-18 (×7): 1000 mL
  Filled 2019-08-12 (×10): qty 1000

## 2019-08-12 NOTE — Progress Notes (Signed)
Physical Therapy Treatment Patient Details Name: Arthur [name] MRN: [id number] DOB: [birth year] Today's Date: 08/12/2019    History of Present Illness Arthur [name] is a 38 y.o. male with medical history significant of untreated HIV, anemia, depression, GERD, substance abuse presenting with c/o dizziness, headache and weakness.  He was admitted with hyponatremia, bradycardia and late syphilis.    PT Comments    Patient seen for mobility progression. Pt in bed upon arrival and agreeable to participate in therapy. Pt requires max A (+2 for safety) for functional transfer training. Pt did not verbally communicate during session but answered all questions with head nods yes/no appropriately. Continue to progress as tolerated.     Follow Up Recommendations  SNF;Supervision/Assistance - 24 hour     Equipment Recommendations  Other (comment)(To be determined at next venue)    Recommendations for Other Services       Precautions / Restrictions Precautions Precautions: Fall Precaution Comments: watch BP, HR Restrictions Weight Bearing Restrictions: No    Mobility  Bed Mobility Overal bed mobility: Needs Assistance Bed Mobility: Supine to Sit     Supine to sit: Max assist     General bed mobility comments: pt able to bring bilat LE to EOB; assist to bring hips to EOB with bed pad and to elevate trunk into sitting  Transfers Overall transfer level: Needs assistance   Transfers: Sit to/from Stand;Stand Pivot Transfers Sit to Stand: Max assist;+2 safety/equipment Stand pivot transfers: Max assist;+2 safety/equipment       General transfer comment: assist to power up into standing and for balance/weight shifting to pivot bed to recliner; +2 for safety but physical assist +1; face to face using gait belt; bilat LE blocked due to knee buckling when attempted steps  Ambulation/Gait                 Stairs             Wheelchair Mobility    Modified Rankin  (Stroke Patients Only)       Balance Overall balance assessment: Needs assistance Sitting-balance support: Feet supported;Bilateral upper extremity supported   Sitting balance - Comments: max A for sitting balance EOB due to posterior bias   Standing balance support: Bilateral upper extremity supported Standing balance-Leahy Scale: Poor                              Cognition Arousal/Alertness: Awake/alert Behavior During Therapy: Flat affect Overall Cognitive Status: Difficult to assess                         Following Commands: Follows one step commands inconsistently;Follows one step commands with increased time       General Comments: pt did not verbally communicate during session but answered questions with yes/no head nods      Exercises      General Comments        Pertinent Vitals/Pain Pain Assessment: No/denies pain    Home Living                      Prior Function            PT Goals (current goals can now be found in the care plan section) Progress towards PT goals: Progressing toward goals    Frequency    Min 2X/week      PT Plan Current plan remains appropriate  Co-evaluation              AM-PAC PT "6 Clicks" Mobility   Outcome Measure  Help needed turning from your back to your side while in a flat bed without using bedrails?: A Lot Help needed moving from lying on your back to sitting on the side of a flat bed without using bedrails?: A Lot Help needed moving to and from a bed to a chair (including a wheelchair)?: A Lot Help needed standing up from a chair using your arms (e.g., wheelchair or bedside chair)?: A Lot Help needed to walk in hospital room?: A Lot Help needed climbing 3-5 steps with a railing? : Total 6 Click Score: 11    End of Session Equipment Utilized During Treatment: Gait belt Activity Tolerance: Patient tolerated treatment well Patient left: with call bell/phone within  reach;in chair;with chair alarm set Nurse Communication: Mobility status PT Visit Diagnosis: Other abnormalities of gait and mobility (R26.89);Muscle weakness (generalized) (M62.81)     Time: 5681-2751 PT Time Calculation (min) (ACUTE ONLY): 27 min  Charges:  $Gait Training: 8-22 mins $Therapeutic Activity: 8-22 mins                     Earney Navy, PTA Acute Rehabilitation Services Pager: [phone] Office: [phone]    Darliss Cheney 08/12/2019, 5:17 PM

## 2019-08-12 NOTE — Progress Notes (Signed)
[facility]  Date of Admission:  07/28/2019     Total days of antibiotics 15         ASSESSMENT:  [name] remains in the ICU and continues to receive treatment for neurosyphilis and now complicated with poitive toxoplasmosis PCR and thrush. Hypoxia appears to be resolving. Discontinue Triumeq to avoid complications with IRIS. Pharmacy has ordered pyrimethamine-leucovorin and sulfadiazine. Will continue current dose of penicillin until 11/17 followed by 2.4 million units of Bicillin intramuscularly. Will remain on Bactrim for now pending availability of toxoplasmosis regimen. Once started will need at least 6 weeks of induction therapy followed by chronic maintenance therapy.  With oxygenation improving will plan for end of metronidazole with penicillin tomorrow.   PLAN:  1. Continue Bactrim and await pyrimethamine-leucovorin and sulfadiazine 2. Discontinue Triumeq. 3. Penicillin until 11/17.  4.  Hyponatremia per primary team.  5. Continue fluconazole for thrush.    Principal Problem:   Neurosyphilis Active Problems:   Acquired immunodeficiency syndrome (HCC)   Toxoplasmosis   Nausea and vomiting   Bradycardia   Hyponatremia   Vision loss   Abnormal brain MRI   Palliative care encounter   Thrush of mouth and esophagus (HCC)   . Chlorhexidine Gluconate Cloth  6 each Topical Daily  . enoxaparin (LOVENOX) injection  40 mg Subcutaneous Q24H  . feeding supplement (OSMOLITE 1.5 CAL)  1,000 mL Per Tube Q24H  . feeding supplement (PRO-STAT SUGAR FREE 64)  30 mL Per Tube BID  . furosemide  40 mg Intravenous BID  . magic mouthwash w/lidocaine  10 mL Oral TID AC & HS  . mouth rinse  15 mL Mouth Rinse BID  . [START ON 08/13/2019] methylPREDNISolone (SOLU-MEDROL) injection  10 mg Intravenous Once  . multivitamin with minerals  1 tablet Per Tube Daily  . [START ON 08/13/2019] penicillin g benzathine (BICILLIN-LA) IM  2.4 Million Units Intramuscular Once   . sodium chloride flush  3 mL Intravenous Once  . sodium chloride flush  3 mL Intravenous Q12H    SUBJECTIVE:  Afebrile overnight with no acute events. Metabolic panel pending with previous sodium stable. Now with positive toxoplasmosis PCR. Answers questions with yes/no response.   No Known Allergies   Review of Systems: Review of Systems  Unable to perform ROS: Acuity of condition    OBJECTIVE: Vitals:   08/12/19 0800 08/12/19 0806 08/12/19 0900 08/12/19 1000  BP: 97/81  (!) 141/90 98/64  Pulse: 63  82 72  Resp: 20  17 (!) 23  Temp:  97.6 F (36.4 C)    TempSrc:  Oral    SpO2: 95%  96% 92%  Weight:      Height:       Body mass index is 16.76 kg/m.  Physical Exam Constitutional:      General: He is not in acute distress.    Appearance: He is well-developed.     Comments: Lying in bed with head of bed elevated; more awake today.   HENT:     Nose:     Comments: Coretrak present.  Cardiovascular:     Rate and Rhythm: Normal rate and regular rhythm.     Heart sounds: Normal heart sounds.  Pulmonary:     Effort: Pulmonary effort is normal.     Breath sounds: Normal breath sounds. No wheezing or rhonchi.  Skin:    General: Skin is warm and dry.  Neurological:     Mental Status: He is alert.  Lab Results Lab Results  Component Value Date   WBC 2.6 (L) 08/12/2019   HGB 14.5 08/12/2019   HCT 40.9 08/12/2019   MCV 84.9 08/12/2019   PLT 334 08/12/2019    Lab Results  Component Value Date   CREATININE 1.05 08/12/2019   BUN 16 08/12/2019   NA 125 (L) 08/12/2019   K 4.5 08/12/2019   CL 88 (L) 08/12/2019   CO2 22 08/12/2019    Lab Results  Component Value Date   ALT 18 08/12/2019   AST 25 08/12/2019   ALKPHOS 45 08/12/2019   BILITOT 0.3 08/12/2019     Microbiology: Recent Results (from the past 240 hour(s))  MRSA PCR Screening     Status: None   Collection Time: 08/10/19  1:35 PM   Specimen: Nasopharyngeal  Result Value Ref Range Status    MRSA by PCR NEGATIVE NEGATIVE Final    Comment:        The GeneXpert MRSA Assay (FDA approved for NASAL specimens only), is one component of a comprehensive MRSA colonization surveillance program. It is not intended to diagnose MRSA infection nor to guide or monitor treatment for MRSA infections. Performed at [facility], [place] [address] [place], [place] [address]      Terri Piedra, [place] [facility] [phone] Pager  08/12/2019  12:14 PM

## 2019-08-12 NOTE — Progress Notes (Signed)
  Speech Language Pathology Treatment: Dysphagia  Patient Details Name: Arthur [name] MRN: [id number] DOB: 1981-02-19 Today's Date: 08/12/2019 Time: 5852-7782 SLP Time Calculation (min) (ACUTE ONLY): 15 min  Assessment / Plan / Recommendation Clinical Impression  Pt needed cues on initial bolus to begin posterior transfer and swallow, but otherwise swallowing on his own after slow oral transit. Pt has poor oral control, losing boluses anteriorly, and suspect with loss of control posteriorly too as he has immediate cough response with ice chips and small amounts of water. His cough sounds weak, but with cues for additional swallows he is able to clear his wet vocal quality and expectorate secretions orally with assistance from SLP/yankauer. Recommend that he remain NPO with temporary alternative means of nutrition (has Cortrak). Will f/u for potential to resume POs with instrumental testing possibly indicated as mentation improves given likely neurologic component (although we may see clinical signs of improvement with clearing mentation as well).    HPI HPI: Pt is a 38 y.o. male admitted with neurosyphillis, CNS toxoplasmosis. MRI 11/1 showed widespread areas concerning for infection, with most dominant abnormality in the L anterior frontal white matter. Hospital course was complicated by hyponatremia and respiratory decline, transferred to ICU on 11/14 but not requiring intubation. Multiple CXRs this admission without acute findings. PMH: substance abuse, HIV, GERD, depression, anxiety, anemia      SLP Plan  Continue with current plan of care       Recommendations  Diet recommendations: NPO Medication Administration: Via alternative means                Oral Care Recommendations: Oral care QID Follow up Recommendations: (tba) SLP Visit Diagnosis: Dysphagia, oropharyngeal phase (R13.12) Plan: Continue with current plan of care       GO                Arthur [name]  Arthur [name] 08/12/2019, 9:03 AM  Pollyann Glen, M.A. [place] Acute Environmental education officer [phone] Office [phone]

## 2019-08-12 NOTE — Progress Notes (Signed)
Transferred to 2C14 via bed. VSS. No visible distress. Report given to [facility].

## 2019-08-12 NOTE — Progress Notes (Signed)
Nutrition Follow-up  DOCUMENTATION CODES:   Underweight  INTERVENTION:   Tube Feeding: Increase Osmolite 1.5 to 50 ml/hr Pro-Stat 30 mL BID Provides 2000 kcals, 106 g of protein and 912 mL of free water  TF provides only 912 mL of free water; no additional free water flushes other than to maintain tube patency  NUTRITION DIAGNOSIS:   Increased nutrient needs related to chronic illness as evidenced by estimated needs.  Being addressed via TF   GOAL:   Patient will meet greater than or equal to 90% of their needs  Progressing  MONITOR:   Supplement acceptance, PO intake, Labs, Weight trends, I & O's  REASON FOR ASSESSMENT:   Consult Enteral/tube feeding initiation and management  ASSESSMENT:   38 y.o. male with medical history significant of untreated HIV, anemia, depression, GERD, substance abuse  Admitted for intracerebral opportunistic infection  Pt is alert and oriented but very weak Pt remains NPO  11/10 Cortrak placed, TF initiated at 20   Osmolite 1.5 at 20 ml/hr, previously at 10 ml/hr. Discussed advancement with MD and received verbal order to advance TF   Pt with severe SIADH, off 3% NS at present. Noted plan for free water restriction. NPO at present so no po contributing to water intake at present. TF at goal rate to provide <1L of free water  Labs: sodium 124 (L), CBGs 87-124, BUN/Creatinine wdl Meds: MVI with minerals, lasix  Diet Order:   Diet Order            Diet NPO time specified  Diet effective now              EDUCATION NEEDS:   Not appropriate for education at this time  Skin:  Skin Assessment: Reviewed RN Assessment  Last BM:  11/15  Height:   Ht Readings from Last 1 Encounters:  07/29/19 5\' 11"  (1.803 m)    Weight:   Wt Readings from Last 1 Encounters:  08/09/19 54.5 kg    Ideal Body Weight:  78.1 kg  BMI:  Body mass index is 16.76 kg/m.  Estimated Nutritional Needs:   Kcal:  2000-2200  Protein:   100-115g  Fluid:  Fluid Restricted  Kerman Passey MS, RDN, LDN, CNSC [phone] Pager  [phone] Weekend/On-Call Pager

## 2019-08-12 NOTE — Progress Notes (Signed)
Pharmacy Antibiotic Note  Arthur [name] is a 38 y.o. male admitted on 07/28/2019 with concern for CNS toxoplasmosis.  Pharmacy has been consulted for Bactrim dosing while awaitng pyrimethamine.  Plan: Will dose trimethoprim at 5 mg/kg  IV q 12 hrs.  Switch to pyrimethamine/leucovorin + sulfadiazine when drug arrives Monitor renal function and clinical improvement  Height: 5\' 11"  (180.3 cm) Weight: 120 lb 2.4 oz (54.5 kg) IBW/kg (Calculated) : 75.3  Temp (24hrs), Avg:97.7 F (36.5 C), Min:97.1 F (36.2 C), Max:97.9 F (36.6 C)  Recent Labs  Lab 08/06/19 0321 08/06/19 0704 08/07/19 0447 08/07/19 2350 08/08/19 0329 08/09/19 0333 08/10/19 0626  WBC  --  2.0* 2.3*  --  2.1* 2.2* 2.7*  CREATININE 0.63  --  0.63  --  0.74 0.68 0.69  LATICACIDVEN  --   --   --  0.6 1.1  --   --     Estimated Creatinine Clearance: 98.4 mL/min (by C-G formula based on SCr of 0.69 mg/dL).    No Known Allergies  (+) VDRL 1:64 (+) CMV serum - 262  (+) RPR with high titer 1:128 (never treated) (+) serum Toxo IgG > 400 (+) Toxo PCR in CNS  Thank you for allowing pharmacy to be a part of this patient's care.  Jimmy Footman, PharmD, BCPS, BCIDP Infectious Diseases Clinical Pharmacist Phone: [phone]  08/12/2019 10:04 AM

## 2019-08-12 NOTE — Progress Notes (Addendum)
Admit: 07/28/2019 LOS: 14  59M with AIDS, Hyponatremia; Neurosyphilis, CNS lesions  Subjective:  . Off 3% since 7:30 am yesterday.  Na was 124--> dropped to 122 this AM.  All other labs pending.  On IV Lasix.  Sodium (mmol/L)  Date Value  08/12/2019 122 (L)  08/12/2019 124 (L)  08/11/2019 124 (L)  08/11/2019 124 (L)  08/11/2019 124 (L)  08/11/2019 125 (L)  08/11/2019 125 (L)  08/11/2019 125 (L)  08/10/2019 121 (L)  08/10/2019 124 (L)     11/15 0701 - 11/16 0700 In: 952.5 [I.V.:25.9; NG/GT:230; IV Piggyback:696.6] Out: 1600 [Urine:1600]  Filed Weights   08/02/19 0615 08/08/19 0407 08/09/19 0338  Weight: 55.1 kg 57.7 kg 54.5 kg    Scheduled Meds: . Chlorhexidine Gluconate Cloth  6 each Topical Daily  . enoxaparin (LOVENOX) injection  40 mg Subcutaneous Q24H  . feeding supplement (OSMOLITE 1.5 CAL)  1,000 mL Per Tube Q24H  . feeding supplement (PRO-STAT SUGAR FREE 64)  30 mL Per Tube BID  . furosemide  40 mg Intravenous BID  . magic mouthwash w/lidocaine  10 mL Oral TID AC & HS  . mouth rinse  15 mL Mouth Rinse BID  . methylPREDNISolone (SOLU-MEDROL) injection  10 mg Intravenous Once  . methylPREDNISolone (SOLU-MEDROL) injection  20 mg Intravenous Daily  . multivitamin with minerals  1 tablet Per Tube Daily  . [START ON 08/13/2019] penicillin g benzathine (BICILLIN-LA) IM  2.4 Million Units Intramuscular Once  . sodium chloride flush  3 mL Intravenous Once  . sodium chloride flush  3 mL Intravenous Q12H   Continuous Infusions: . sodium chloride Stopped (08/09/19 0023)  . chlorproMAZINE (THORAZINE) IV 12.5 mg (08/09/19 1135)  . famotidine (PEPCID) IV Stopped (08/11/19 1010)  . fluconazole (DIFLUCAN) IV Stopped (08/11/19 1634)  . metronidazole 500 mg (08/12/19 0832)  . penicillin g continuous IV infusion 12 Million Units (08/12/19 0546)  . sulfamethoxazole-trimethoprim     PRN Meds:.sodium chloride, acetaminophen **OR** acetaminophen, chlorproMAZINE (THORAZINE) IV,  haloperidol lactate, ondansetron **OR** ondansetron (ZOFRAN) IV, phenol, sodium chloride flush  Current Labs: reviewed    Ref. Range 07/28/2019 22:01 08/05/2019 08:00 08/08/2019 18:37  Osmolality, Urine Latest Ref Range: 300 - 900 mOsm/kg 1,024 (H) 558 1,008 (H)  Sodium, Urine Latest Units: mmol/L 111 154 243     Ref. Range 08/08/2019 19:03  Osmolality Latest Ref Range: 275 - 295 mOsm/kg 263 (L)    Physical Exam:  Blood pressure (!) 87/64, pulse 64, temperature 97.6 F (36.4 C), temperature source Oral, resp. rate 20, height 5\' 11"  (1.803 m), weight 54.5 kg, SpO2 (!) 89 %. GEN Encephalopathic/Somnolent, doesn't interact HEENT: significant temporal wasting CV: RRR PULM: normal WOB, clear bilaterally no c/w/r EXT: No edema   A 1. Hyponatremia, Euvolemic, Hypotonic: UOsms and UNa both elevated.  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  Has a large obligate intake d/t IVFs.  Increase Lasix to 40 IV BID.  Continue serial Na checks- q4.  Would continue to aggressively fluid restrict- no PO fluids given large obligate intake. 2. AMS/Encepahlopathy: numerous causes including #1--> Neurosyphilis and toxo being treated per ID 3. FTT: Tube Feeds 4. AIDS    MD 08/12/2019, 9:07 AM  Recent Labs  Lab 08/06/19 1821 08/07/19 0447 08/07/19 1652 08/08/19 0329 08/09/19 0333  08/10/19 0626  08/11/19 2126 08/12/19 0230 08/12/19 0621  NA  --  125*  --  124* 120*   < > 118*   < >  124* 124* 122*  K  --  4.5  --  4.7 4.9  --  5.0  --   --   --   --   CL  --  99  --  94* 88*  --  84*  --   --   --   --   CO2  --  21*  --  23 23  --  22  --   --   --   --   GLUCOSE  --  92  --  111* 96  --  109*  --   --   --   --   BUN  --  10  --  14 14  --  16  --   --   --   --   CREATININE  --  0.63  --  0.74 0.68  --  0.69  --   --   --   --   CALCIUM  --  8.6*  --  9.2 9.1  --  9.6  --   --   --   --   PHOS 3.5 2.9 3.1  --   --   --   --   --   --   --   --     < > = values in this interval not displayed.   Recent Labs  Lab 08/08/19 0329 08/09/19 0333 08/10/19 0626  WBC 2.1* 2.2* 2.7*  HGB 13.3 13.9 14.6  HCT 38.1* 39.5 41.3  MCV 85.0 84.9 84.6  PLT 268 305 321

## 2019-08-12 NOTE — Progress Notes (Signed)
Upon entering room, this RN noticed pt pulling on cortrak. Pt is alert and oriented x3. Pt kept stating, "Pull it out, pull it out!" Pt pulled out IV in left arm, bed was wet, condom catheter was mostly off. Pt was reoriented to situation and bed was changed. Will continue to monitor.

## 2019-08-12 NOTE — Progress Notes (Addendum)
   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  Hospital course complicated by worsening hyponatremia, respiratory decline prompting transfer to ICU.  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  Hospital course complicated by worsening hyponatremia, respiratory decline prompting transfer to ICU.  History per chart review as patient is nearly obtunded.   Apparetnly waxing/waning mental status, occasional bouts of N/V, increasing O2 requirements raises concern for aspiration events.  Sodium slowly dropping in setting of euvolemia and inappropriately elevated urine osms c/w SIADH.  PCCM asked to help manage developing respiratory failure.  Past Medical History  [place] [facility] Events   11/1 admitted 11/14 transfer to ICU  Consults:  PCCM, Nephro, Neuro, ID  Procedures:  11/3 LP  Significant Diagnostic Tests:  LP: >600 protein, 64 WBC, 96% lymph, O RBC, VDRL 1:64, Toxoplasma +, crypto neg, HSV neg, Tb probe neg, bacterial cult neg Utox cannibis CD4 35, HIV 73k   Antimicrobials:  Abacavir/dolutegravis/lamivudine (Triumeq) >> Fluconazole 11/7 >> Flagyl 11/13 >> PCN 11/1 >> Bactrim 11/14 >>  Interim history/subjective:  Has periods of alertness and somnolence.  HD stable, stable from respiratory standpoint as well.  Objective   Blood pressure (!) 87/64, pulse 64, temperature 97.8 F (36.6 C), temperature source Oral, resp. rate 20, height 5\' 11"  (1.803 m), weight 54.5 kg, SpO2 (!) 89 %.        Intake/Output Summary (Last 24 hours) at 08/12/2019 0728 Last data filed at 08/12/2019 0500 Gross per 24 hour  Intake  952.52 ml  Output 1600 ml  Net -647.48 ml   Filed Weights   08/02/19 0615 08/08/19 0407 08/09/19 0338  Weight: 55.1 kg 57.7 kg 54.5 kg    Examination: General: ill appearing man in bed HENT: MMM, no obvious thrush, temporal wasting Lungs: good air movement today, no accessory muscle use Cardiovascular: RRR, ext warm Abdomen: Soft, +BS Extremities: +muscle wasting Neuro: not answering my questions today Skin: no rashes  Resolved Hospital Problem list     Assessment & Plan:  # Encephalopathy- multifactorial related to neurosyphllis and worsening hyponatremia, remains an issue but overall # Acute hypoxemic respiratory failure-related to secretion clearance issues in upper airway, improved # Abnormal MRI- working diagnosis of neurosyphillis +/- toxoplasmosis given response to current therapy # 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

## 2019-08-12 NOTE — Progress Notes (Signed)
Palliative: Arthur [name] is resting quietly in bed.  He will briefly make but not keep eye contact.  He is alert and oriented, but appears very weak.  He is calm and cooperative, able to make his basic needs known.  There is no family at bedside at this time.  We talked about his current health concerns, although not in detail.  He has no questions or needs at this time.  Conference with bedside nursing staff related to patient condition, needs, disposition.  Arthur [name] is slowly improving, goals are set for full scope/full code.  Anticipate possible discharge to residential SNF.  Plan:   Continue full scope/full code.  Discharge options being reviewed.   50 minutes  Quinn Axe, NP Palliative Medicine Team Team Phone # [phone] Greater than 50% of this time was spent counseling and coordinating care related to the above assessment and plan.

## 2019-08-13 LAB — COMPREHENSIVE METABOLIC PANEL
ALT: 18 U/L (ref 0–44)
AST: 29 U/L (ref 15–41)
Albumin: 2.9 g/dL — ABNORMAL LOW (ref 3.5–5.0)
Alkaline Phosphatase: 48 U/L (ref 38–126)
Anion gap: 9 (ref 5–15)
BUN: 17 mg/dL (ref 6–20)
CO2: 23 mmol/L (ref 22–32)
Calcium: 9.1 mg/dL (ref 8.9–10.3)
Chloride: 86 mmol/L — ABNORMAL LOW (ref 98–111)
Creatinine, Ser: 1.01 mg/dL (ref 0.61–1.24)
GFR calc Af Amer: >60 mL/min (ref >60)
GFR calc non Af Amer: >60 mL/min (ref >60)
Glucose, Bld: 98 mg/dL (ref 70–99)
Potassium: 4.6 mmol/L (ref 3.5–5.1)
Sodium: 118 mmol/L — CL (ref 135–145)
Total Bilirubin: 0.4 mg/dL (ref 0.3–1.2)
Total Protein: 8.4 g/dL — ABNORMAL HIGH (ref 6.5–8.1)

## 2019-08-13 LAB — CBC WITH DIFFERENTIAL/PLATELET
Abs Immature Granulocytes: 0.03 10*3/uL (ref 0.00–0.07)
Basophils Absolute: 0 10*3/uL (ref 0.0–0.1)
Basophils Relative: 1 %
Eosinophils Absolute: 0 10*3/uL (ref 0.0–0.5)
Eosinophils Relative: 1 %
HCT: 38.9 % — ABNORMAL LOW (ref 39.0–52.0)
Hemoglobin: 13.9 g/dL (ref 13.0–17.0)
Immature Granulocytes: 1 %
Lymphocytes Relative: 31 %
Lymphs Abs: 0.7 10*3/uL (ref 0.7–4.0)
MCH: 30.2 pg (ref 26.0–34.0)
MCHC: 35.7 g/dL (ref 30.0–36.0)
MCV: 84.6 fL (ref 80.0–100.0)
Monocytes Absolute: 0.3 10*3/uL (ref 0.1–1.0)
Monocytes Relative: 12 %
Neutro Abs: 1.1 10*3/uL — ABNORMAL LOW (ref 1.7–7.7)
Neutrophils Relative %: 54 %
Platelets: 323 10*3/uL (ref 150–400)
RBC: 4.6 MIL/uL (ref 4.22–5.81)
RDW: 13.3 % (ref 11.5–15.5)
WBC: 2.1 10*3/uL — ABNORMAL LOW (ref 4.0–10.5)
nRBC: 0 % (ref 0.0–0.2)

## 2019-08-13 LAB — GLUCOSE, CAPILLARY
Glucose-Capillary: 103 mg/dL — ABNORMAL HIGH (ref 70–99)
Glucose-Capillary: 106 mg/dL — ABNORMAL HIGH (ref 70–99)
Glucose-Capillary: 111 mg/dL — ABNORMAL HIGH (ref 70–99)
Glucose-Capillary: 133 mg/dL — ABNORMAL HIGH (ref 70–99)
Glucose-Capillary: 84 mg/dL (ref 70–99)
Glucose-Capillary: 96 mg/dL (ref 70–99)

## 2019-08-13 LAB — SODIUM
Sodium: 120 mmol/L — ABNORMAL LOW (ref 135–145)
Sodium: 121 mmol/L — ABNORMAL LOW (ref 135–145)
Sodium: 119 mmol/L — CL (ref 135–145)
Sodium: 122 mmol/L — ABNORMAL LOW (ref 135–145)

## 2019-08-13 MED ORDER — FUROSEMIDE 10 MG/ML IJ SOLN
80.0000 mg | Freq: Two times a day (BID) | INTRAMUSCULAR | Status: DC
  Administered 2019-08-13 – 2019-08-17 (×9): 80 mg via INTRAVENOUS
  Filled 2019-08-13 (×9): qty 8

## 2019-08-13 MED ORDER — FUROSEMIDE 10 MG/ML IJ SOLN
20.0000 mg | Freq: Once | INTRAMUSCULAR | Status: AC
  Administered 2019-08-13: 20 mg via INTRAVENOUS
  Filled 2019-08-13: qty 2

## 2019-08-13 MED ORDER — FLUCONAZOLE 200 MG PO TABS
200.0000 mg | ORAL_TABLET | Freq: Every day | ORAL | Status: DC
  Administered 2019-08-13: 200 mg via ORAL
  Filled 2019-08-13 (×2): qty 1

## 2019-08-13 MED ORDER — SODIUM CHLORIDE 1 G PO TABS
1.0000 g | ORAL_TABLET | Freq: Three times a day (TID) | ORAL | Status: DC
  Administered 2019-08-13 – 2019-08-14 (×2): 1 g via ORAL
  Filled 2019-08-13 (×4): qty 1

## 2019-08-13 MED ORDER — FUROSEMIDE 10 MG/ML IJ SOLN: 60.0000 mg | Freq: Two times a day (BID) | INTRAMUSCULAR | Status: DC

## 2019-08-13 NOTE — Progress Notes (Signed)
Critical lab NA 118 Dr Kurtis Bushman made aware and advised to let Dr. Hollie Salk know. Paged Dr. Hollie Salk to make her aware. Will continue to monitor closely.

## 2019-08-13 NOTE — Progress Notes (Signed)
Occupational Therapy Treatment Patient Details Name: Arthur [name] MRN: [id number] DOB: 18-Mar-1981 Today's Date: 08/13/2019    History of present illness Arthur [name] is a 38 y.o. male with medical history significant of untreated HIV, anemia, depression, GERD, substance abuse presenting with c/o dizziness, headache and weakness.  He was admitted with hyponatremia, bradycardia and late syphilis.   OT comments  Pt completed grooming and bathing bed level task this session with (A). Pt awake but drowsy. Pt verbalized name with cuing and throat clearing.    Follow Up Recommendations  SNF;Home health OT;Supervision/Assistance - 24 hour    Equipment Recommendations  3 in 1 bedside commode    Recommendations for Other Services      Precautions / Restrictions Precautions Precautions: Fall Precaution Comments: watch BP, HR       Mobility Bed Mobility Overal bed mobility: Needs Assistance             General bed mobility comments: total (A) to lift head to position cords and pillow long sitting attempt  Transfers                 General transfer comment: defer at this time    Balance                                           ADL either performed or assessed with clinical judgement   ADL   Eating/Feeding: NPO   Grooming: Wash/dry face;Oral care;Minimal assistance;Bed level Grooming Details (indicate cue type and reason): pt provided support at elbow for oral care and able to complete. OT total (A) to brush tongue but patient sticks tongue out on commad. pt washing face and allowing therapist to clean eyes for drainage present Upper Body Bathing: Moderate assistance;Bed level Upper Body Bathing Details (indicate cue type and reason): pt using R hand to wash chest and L underarm. pt using bil Ue to apply deordorant to each arm pit. pt total (A) for R arm and under arm. Pt with deep breath after task                             General ADL Comments: pt completed UB bathing supine and dressing new goal. pt sustain attention to task     Vision       Perception     Praxis      Cognition Arousal/Alertness: Awake/alert Behavior During Therapy: Flat affect Overall Cognitive Status: Difficult to assess                                 General Comments: pt following commands to complete adl task. pt asked to say his name. pt voices Arthur [name] with a throat clear. pt asked do you want [place] to call you Arthur [name] and pt head nods yes. pt sustains attention to task but very minimal verbalizations back to therapist        Exercises     Shoulder Instructions       General Comments VSS    Pertinent Vitals/ Pain       Pain Assessment: No/denies pain  Home Living  Prior Functioning/Environment              Frequency  Min 2X/week        Progress Toward Goals  OT Goals(current goals can now be found in the care plan section)  Progress towards OT goals: Progressing toward goals  Acute Rehab OT Goals Patient Stated Goal: none stated OT Goal Formulation: With patient Time For Goal Achievement: 08/27/19 Potential to Achieve Goals: Good ADL Goals Pt Will Perform Upper Body Bathing: with supervision Pt Will Perform Lower Body Bathing: with supervision Pt Will Perform Upper Body Dressing: with supervision Pt Will Perform Lower Body Dressing: with supervision Pt Will Transfer to Toilet: with supervision Pt Will Perform Toileting - Clothing Manipulation and hygiene: with supervision Pt Will Perform Tub/Shower Transfer: with supervision  Plan Discharge plan remains appropriate    Co-evaluation                 AM-PAC OT "6 Clicks" Daily Activity     Outcome Measure   Help from another person eating meals?: A Lot Help from another person taking care of personal grooming?: A Lot Help from another person toileting, which includes  using toliet, bedpan, or urinal?: Total Help from another person bathing (including washing, rinsing, drying)?: A Lot Help from another person to put on and taking off regular upper body clothing?: A Lot Help from another person to put on and taking off regular lower body clothing?: Total 6 Click Score: 10    End of Session    OT Visit Diagnosis: Muscle weakness (generalized) (M62.81)   Activity Tolerance Patient tolerated treatment well   Patient Left in bed;with call bell/phone within reach   Nurse Communication Mobility status        Time: 6812-7517 OT Time Calculation (min): 15 min  Charges: OT General Charges $OT Visit: 1 Visit OT Treatments $Self Care/Home Management : 8-22 mins   Brynn, OTR/L  Acute Rehabilitation Services Pager: [phone] Office: [phone] .    Jeri Modena 08/13/2019, 5:29 PM

## 2019-08-13 NOTE — Progress Notes (Signed)
RN walked into pt room. Pt resting and found cortrac laying in pt's bed. Pt unaware tube was out. Cortrac team paged. MD Dr. Kurtis Bushman paged and made aware. Advised to put cortrac back in as soon as possible preferably today. Will continue to monitor.

## 2019-08-13 NOTE — Progress Notes (Signed)
[facility]  Date of Admission:  07/28/2019     Total days of antibiotics 16         ASSESSMENT:  Mr. Tomb has completed treatment for neurosyphilis and has received 2.4 million units of Bicillin today. Remains on Bactrim for toxoplasmosis awaiting arrival of medications to pharmacy. Ginette Pitman appears to be improved and will continue for 5 more days. Continues to have hyponatremia and followed by nephrology with concern for SIADH and on fluid restriction. Respiratory status improved and will end metronidazole now that penicillin is completed. Will need at least 6 weeks of acute therapy for toxoplasmosis followed by maintenance therapy.  PLAN:  1. Continue Bactrim 2. End date of fluconazole for 5 days.  3. Continue to hold ART therapy in the setting of toxoplasmosis.  4. Will stop metronidazole with improved respiratory status. 5. Optimize nutrition as able. 6. Hyponatremia per nephrology and primary team.   Principal Problem:   Neurosyphilis Active Problems:   Acquired immunodeficiency syndrome (HCC)   Toxoplasmosis   Nausea and vomiting   Bradycardia   Hyponatremia   Vision loss   Abnormal brain MRI   Palliative care encounter   Thrush of mouth and esophagus (HCC)   Goals of care, counseling/discussion   . Chlorhexidine Gluconate Cloth  6 each Topical Daily  . enoxaparin (LOVENOX) injection  40 mg Subcutaneous Q24H  . feeding supplement (OSMOLITE 1.5 CAL)  1,000 mL Per Tube Q24H  . feeding supplement (PRO-STAT SUGAR FREE 64)  30 mL Per Tube TID  . fluconazole  200 mg Oral Daily  . furosemide  60 mg Intravenous BID  . magic mouthwash w/lidocaine  10 mL Oral TID AC & HS  . mouth rinse  15 mL Mouth Rinse BID  . multivitamin with minerals  1 tablet Per Tube Daily  . sodium chloride flush  3 mL Intravenous Once  . sodium chloride  1 g Oral TID WC    SUBJECTIVE:  Afebrile overnight with no acute events. Moved out of the ICU into progressive  care. Feeling better today and able to verbalize some.   No Known Allergies   Review of Systems: Review of Systems  Constitutional: Negative for chills, fever and weight loss.  Respiratory: Positive for cough. Negative for shortness of breath and wheezing.   Cardiovascular: Negative for chest pain and leg swelling.  Gastrointestinal: Negative for abdominal pain, constipation, diarrhea, nausea and vomiting.  Skin: Negative for rash.      OBJECTIVE: Vitals:   08/13/19 0742 08/13/19 0820 08/13/19 1047 08/13/19 1152  BP: 98/70  114/74 104/67  Pulse: 72 69 74 76  Resp: (!) 22 19 (!) 21 (!) 21  Temp: (!) 97.3 F (36.3 C)  97.6 F (36.4 C)   TempSrc: Oral  Oral   SpO2: 97% 91% 93% 92%  Weight:      Height:       Body mass index is 16.82 kg/m.  Physical Exam Constitutional:      General: He is not in acute distress.    Appearance: He is well-developed. He is not ill-appearing.  HENT:     Nose:     Comments: Coretrak present and capped not infusing.  Cardiovascular:     Rate and Rhythm: Normal rate and regular rhythm.     Heart sounds: Normal heart sounds.  Pulmonary:     Effort: Pulmonary effort is normal.     Breath sounds: Rhonchi present. No wheezing.  Skin:  General: Skin is warm and dry.  Neurological:     Mental Status: He is alert.     Lab Results Lab Results  Component Value Date   WBC 2.1 (L) 08/13/2019   HGB 13.9 08/13/2019   HCT 38.9 (L) 08/13/2019   MCV 84.6 08/13/2019   PLT 323 08/13/2019    Lab Results  Component Value Date   CREATININE 1.01 08/13/2019   BUN 17 08/13/2019   NA 118 (LL) 08/13/2019   K 4.6 08/13/2019   CL 86 (L) 08/13/2019   CO2 23 08/13/2019    Lab Results  Component Value Date   ALT 18 08/13/2019   AST 29 08/13/2019   ALKPHOS 48 08/13/2019   BILITOT 0.4 08/13/2019     Microbiology: Recent Results (from the past 240 hour(s))  MRSA PCR Screening     Status: None   Collection Time: 08/10/19  1:35 PM   Specimen:  Nasopharyngeal  Result Value Ref Range Status   MRSA by PCR NEGATIVE NEGATIVE Final    Comment:        The GeneXpert MRSA Assay (FDA approved for NASAL specimens only), is one component of a comprehensive MRSA colonization surveillance program. It is not intended to diagnose MRSA infection nor to guide or monitor treatment for MRSA infections. Performed at [facility], [place] [address], [place], [place] [address]      Terri Piedra, [place] for [place] [phone] Pager  08/13/2019  2:41 PM

## 2019-08-13 NOTE — Progress Notes (Signed)
Paged cortrac team on amion at 1515 and 1630. No call back received. MD advised cortrac to be placed back in the morning first thing. Will continue to monitor.

## 2019-08-13 NOTE — Progress Notes (Signed)
[facility] Progress Note    Assessment/ Plan:   1. 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  Initially on 3% saline, now off and on IV Lasix 40 IV BID.  Pt had 4.7L in yesterday--> large obligate intake is going to be difficult to make any headway with hyponatremia.  Recommend PO antibiotics whenever possible, no free water flushes, no PO free water.  Would stop all KVO and medlock.  Continue Na checks. 2.   AMS/Encepahlopathy: numerous causes including #1--> Neurosyphilis and toxo being treated per ID 3. Neurosyphilis-- IV pen G and today 11/17 per ID note being transitioned to IM Bicillin--> this will help with IV obligate intake  4. Toxoplasmosis- on Bactrim and awaiting pyrimethamine-leucovorin and sulfadiazine from pharmacy 5. FTT: Tube Feeds 6. AIDS-- Off Triumeq per ID 7. Dispo: palliative care following- appreciate assistance  Subjective:    Na fluctuating around 120-122--> has significant obligate intake, had about 4.7 L in yesterday with 2.3L out.     Objective:   BP 98/70 (BP Location: Left Arm)   Pulse 69   Temp (!) 97.3 F (36.3 C) (Oral)   Resp 19   Ht 5\' 11"  (1.803 m)   Wt 54.7 kg   SpO2 91%   BMI 16.82 kg/m   Intake/Output Summary (Last 24 hours) at 08/13/2019 0830 Last data filed at 08/13/2019 0300 Gross per 24 hour  Intake 4774.76 ml  Output 2350 ml  Net 2424.76 ml   Weight change:   Physical Exam: GEN sleeping comfortably, not interactive today HEENT: significant temporal wasting NECK: no JVD CV: RRR no m/r/g PULM: normal WOB, clear bilaterally no c/w/r EXT: No edema  Imaging: No results found.  Labs: BMET Recent Labs  Lab 08/06/19 1444 08/06/19 1821  08/07/19 0447 08/07/19 1652 08/08/19 0329 08/09/19 0333  08/10/19 0626  08/12/19 0621 08/12/19 1020 08/12/19 1458 08/12/19 1825 08/12/19 2213 08/13/19 0207 08/13/19 0619  NA  --   --    < > 125*  --  124* 120*    < > 118*   < > 122* 125* 124* 121* 122* 120* 121*  K  --   --   --  4.5  --  4.7 4.9  --  5.0  --   --  4.5  --   --   --   --   --   CL  --   --   --  99  --  94* 88*  --  84*  --   --  88*  --   --   --   --   --   CO2  --   --   --  21*  --  23 23  --  22  --   --  22  --   --   --   --   --   GLUCOSE  --   --   --  92  --  111* 96  --  109*  --   --  108*  --   --   --   --   --   BUN  --   --   --  10  --  14 14  --  16  --   --  16  --   --   --   --   --   CREATININE  --   --   --  0.63  --  0.74 0.68  --  0.69  --   --  1.05  --   --   --   --   --   CALCIUM  --   --   --  8.6*  --  9.2 9.1  --  9.6  --   --  9.6  --   --   --   --   --   PHOS 2.7 3.5  --  2.9 3.1  --   --   --   --   --   --   --   --   --   --   --   --    < > = values in this interval not displayed.   CBC Recent Labs  Lab 08/08/19 0329 08/09/19 0333 08/10/19 0626 08/12/19 1020  WBC 2.1* 2.2* 2.7* 2.6*  NEUTROABS  --   --   --  1.5*  HGB 13.3 13.9 14.6 14.5  HCT 38.1* 39.5 41.3 40.9  MCV 85.0 84.9 84.6 84.9  PLT 268 305 321 334    Medications:    . Chlorhexidine Gluconate Cloth  6 each Topical Daily  . enoxaparin (LOVENOX) injection  40 mg Subcutaneous Q24H  . feeding supplement (OSMOLITE 1.5 CAL)  1,000 mL Per Tube Q24H  . feeding supplement (PRO-STAT SUGAR FREE 64)  30 mL Per Tube TID  . furosemide  40 mg Intravenous BID  . magic mouthwash w/lidocaine  10 mL Oral TID AC & HS  . mouth rinse  15 mL Mouth Rinse BID  . methylPREDNISolone (SOLU-MEDROL) injection  10 mg Intravenous Once  . multivitamin with minerals  1 tablet Per Tube Daily  . penicillin g benzathine (BICILLIN-LA) IM  2.4 Million Units Intramuscular Once  . sodium chloride flush  3 mL Intravenous Once  . sodium chloride flush  3 mL Intravenous Q12H      Bufford Buttner, MD 08/13/2019, 8:30 AM

## 2019-08-13 NOTE — Progress Notes (Signed)
PROGRESS NOTE    Arthur [name]  VHQ:[id number] DOB: 02/08/1981 DOA: 07/28/2019 PCP: Randall HissVan Dam, Cornelius N, MD    Brief Narrative:  Pt with hx/o depression, GERD, substance abusepresented to ED on 07/28/2019 with a complaint of dizziness, weakness, intermittent headache. He has not taken any of his HIV medications for about 1 year due to affordability issue. MRI brain done in ED showed Widespread areas of abnormal low level restricted diffusion and postcontrast enhancement throughout both cerebral hemispheres, predominantly infratentorial, with the dominant abnormality in the LEFT anterior frontal white matter consistent with an opportunistic infection, such as toxoplasmosis, tuberculosis or fungal cerebritis/meningitis, or parasitic infection.  Patient was admitted to hospital service and ID was consulted. He then revealed to Arthur [name] that he has lost his vision in his right eye. ID started him on penicillin and ganciclovir for possible CMV retinitis versus synovitis retinitis and ophthalmology also saw him.  07/30/2019-status post lumbar puncture by neurology.  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 intake,mild hypotensive.  On 11/15 pt was transferred to ICU for tx of severe hyponatremia/SIADH for tx with hypertonic saline as his MS was worse, and respiratory status was declining. MS improving, NA improving, pt was transferred to SDU on 11/16.     Consultants: Neurology, infectious disease, palliative care, nephrology, critical care Procedures:Lumbar puncture  Antimicrobials: High-dose penicillin for neurosyphilis Bactrim for prophylaxis   Subjective: Patient seen and examined.  Laying in bed with eyes closed.  When I call his name he does open his eyes.  When asking questions about how he  is feeling if he has any pain shortness of breath or any other symptoms he nods his head "no". TF in place.  Objective: Vitals:   08/12/19 1820 08/12/19 2350 08/13/19 0359 08/13/19 0700  BP: 110/77 103/72 92/61   Pulse: 66 64 74   Resp: 16 20 (!) 21   Temp: 97.8 F (36.6 C) 97.8 F (36.6 C) 97.8 F (36.6 C)   TempSrc: Oral Axillary Axillary   SpO2: 96% 99% 99%   Weight:    54.7 kg  Height:        Intake/Output Summary (Last 24 hours) at 08/13/2019 0719 Last data filed at 08/13/2019 0300 Gross per 24 hour  Intake 4794.76 ml  Output 2350 ml  Net 2444.76 ml   Filed Weights   08/08/19 0407 08/09/19 0338 08/13/19 0700  Weight: 57.7 kg 54.5 kg 54.7 kg    Examination:  General exam laying in bed eyes closed, interacts by nodding his head, NAD Respiratory system: CTA with poor respiratory effort, no wheezing Cardiovascular system: RRR S1 & S2 heard, No JVD, no Gastrointestinal system: Abdomen soft, nondistended, nontender, positive bowel sounds Central nervous system: opens eyes, but not much interaction exacept noding head. Extremities: no edema    Data Reviewed: I have personally reviewed following labs and imaging studies  CBC: Recent Labs  Lab 08/07/19 0447 08/08/19 0329 08/09/19 0333 08/10/19 0626 08/12/19 1020  WBC 2.3* 2.1* 2.2* 2.7* 2.6*  NEUTROABS  --   --   --   --  1.5*  HGB 12.1* 13.3 13.9 14.6 14.5  HCT 34.5* 38.1* 39.5 41.3 40.9  MCV 86.7 85.0 84.9 84.6 84.9  PLT 241 268 305 321 334   Basic Metabolic Panel: Recent Labs  Lab 08/06/19 1444 08/06/19 1821  08/07/19 0447 08/07/19 1652 08/08/19 0329 08/09/19 [id number]  08/10/19 0626  08/12/19 1020 08/12/19 1458 08/12/19 1825 08/12/19 2213 08/13/19 0207 08/13/19 0619  NA  --   --    < > 125*  --  124* 120*   < > 118*   < > 125* 124* 121* 122* 120* 121*  K  --   --   --  4.5  --  4.7 4.9  --  5.0  --  4.5  --   --   --   --   --   CL  --   --   --  99  --  94* 88*  --  84*  --  88*  --   --   --    --   --   CO2  --   --   --  21*  --  23 23  --  22  --  22  --   --   --   --   --   GLUCOSE  --   --   --  92  --  111* 96  --  109*  --  108*  --   --   --   --   --   BUN  --   --   --  10  --  14 14  --  16  --  16  --   --   --   --   --   CREATININE  --   --   --  0.63  --  0.74 0.68  --  0.69  --  1.05  --   --   --   --   --   CALCIUM  --   --   --  8.6*  --  9.2 9.1  --  9.6  --  9.6  --   --   --   --   --   MG 1.6* 1.6*  --  1.7 1.8  --   --   --   --   --   --   --   --   --   --   --   PHOS 2.7 3.5  --  2.9 3.1  --   --   --   --   --   --   --   --   --   --   --    < > = values in this interval not displayed.   GFR: Estimated Creatinine Clearance: 75.2 mL/min (by C-G formula based on SCr of 1.05 mg/dL). Liver Function Tests: Recent Labs  Lab 08/10/19 0626 08/12/19 1020  AST 26 25  ALT 17 18  ALKPHOS 51 45  BILITOT 0.3 0.3  PROT 8.9* 8.7*  ALBUMIN 3.1* 3.1*   No results for input(s): LIPASE, AMYLASE in the last 168 hours. No results for input(s): AMMONIA in the last 168 hours. Coagulation Profile: No results for input(s): INR, PROTIME in the last 168 hours. Cardiac Enzymes: No results for input(s): CKTOTAL, CKMB, CKMBINDEX, TROPONINI in the last 168 hours. BNP (last 3 results) No results for input(s): PROBNP in the last 8760 hours. HbA1C: No results for input(s): HGBA1C in the last 72 hours. CBG: Recent Labs  Lab 08/12/19 1227 08/12/19 1627 08/12/19 2019 08/13/19 0008 08/13/19 0416  GLUCAP 124* 87 112* 133* 84   Lipid Profile: No results for input(s): CHOL, HDL, LDLCALC, TRIG, CHOLHDL, LDLDIRECT in the last 72 hours. Thyroid Function Tests: No results for input(s):  TSH, T4TOTAL, FREET4, T3FREE, THYROIDAB in the last 72 hours. Anemia Panel: No results for input(s): VITAMINB12, FOLATE, FERRITIN, TIBC, IRON, RETICCTPCT in the last 72 hours. Sepsis Labs: Recent Labs  Lab 08/07/19 2350 08/08/19 0329  LATICACIDVEN 0.6 1.1    Recent Results (from the  past 240 hour(s))  MRSA PCR Screening     Status: None   Collection Time: 08/10/19  1:35 PM   Specimen: Nasopharyngeal  Result Value Ref Range Status   MRSA by PCR NEGATIVE NEGATIVE Final    Comment:        The GeneXpert MRSA Assay (FDA approved for NASAL specimens only), is one component of a comprehensive MRSA colonization surveillance program. It is not intended to diagnose MRSA infection nor to guide or monitor treatment for MRSA infections. Performed at [facility], [address] [place], [place] [address]          Radiology Studies: No results found.      Scheduled Meds: . Chlorhexidine Gluconate Cloth  6 each Topical Daily  . enoxaparin (LOVENOX) injection  40 mg Subcutaneous Q24H  . feeding supplement (OSMOLITE 1.5 CAL)  1,000 mL Per Tube Q24H  . feeding supplement (PRO-STAT SUGAR FREE 64)  30 mL Per Tube TID  . furosemide  40 mg Intravenous BID  . magic mouthwash w/lidocaine  10 mL Oral TID AC & HS  . mouth rinse  15 mL Mouth Rinse BID  . methylPREDNISolone (SOLU-MEDROL) injection  10 mg Intravenous Once  . multivitamin with minerals  1 tablet Per Tube Daily  . penicillin g benzathine (BICILLIN-LA) IM  2.4 Million Units Intramuscular Once  . sodium chloride flush  3 mL Intravenous Once  . sodium chloride flush  3 mL Intravenous Q12H   Continuous Infusions: . sodium chloride Stopped (08/09/19 0023)  . chlorproMAZINE (THORAZINE) IV 12.5 mg (08/09/19 1135)  . famotidine (PEPCID) IV Stopped (08/12/19 1007)  . fluconazole (DIFLUCAN) IV 100 mL/hr at 08/12/19 1600  . metronidazole 500 mg (08/13/19 0124)  . 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 toxoplasmosis antibodies positive, CSF positive for VDRL Positive CMV on ganciclovir...>was discontinuedwith negative CNS PCR. TB gold plus negative.MTB probenegative ID following-recommend continuing penicillin for 14 days (see below)  RIPEwas stopped Plan now..> IV pen G and today 11/17 per ID note being transitioned to IM  2.5 million unit of Bicillin--> this will help with IV obligate intake    AMS/encephlopathy- Multiple causes, then  worsening Na level. -obtained UOsms down -Fluid restrict very aggressively -Transferred to ICU for 3% saline and iv lasix. Na improved, transferred back to SDU now, but NA 118. Nephrology notified, plan for sodium tablets. Continue Bactrim until pending availability of toxoplasmosis regimen, await pyrimethamine-leucovorin and sulfadiazine       Right eye blindless/Optic retinitis/neurititis mainly concerned between syphilis and CMV-butCMV PCR negative in the CNS. Received steroid injection 08/02/2019 Receivedethambutol, isoniazid, rifampicin and pyridoxine, pyrazinamide for tuberculosis-but then discontinued Ganciclovir forCMVwas d/c'd nowwith negative CNS PCR -Haldol and bedside sitter for safety and fall risk. Repeat MRIbrain and orbits  show improvement of brain lesions and optic neurititis, retinitis. 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   Vomiting-  Resolved with stopping TF few days ago cxr did not reveal aspiration pna. HOB elevated Place on aspiration precautions Back on TF now, monitor.    Leukopenia/immunocompromised in the setting of  untreated HIV WBC 2.7>>2.1 Continue to closely monitor Fluconazole for thrush   Untreated HIV started the [facility] ID following   Hypomagnesemia Replaced and stable   Acute metabolic encephalopathy: Due to #1. As needed Ativan and Haldol.  Neurology was following   Dysphagia Seen by speech therapist N.p.o. Cortrackteam consulted for placement Dietitian for PEG tube feeding-  Severe protein calorie malnutrition Albumin 2.7 with BMI 16 Dietitian consultedfollowing diet. cortrack tubeplacement on 08/06/2019. 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 Treating as above.  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 Seen by palliative care. Currently recommending continuing aggressive measures.   DVT prophylaxis:Lovenox subcu daily Code Status:Full code Family Communication:No one at bedside Disposition Plan:Pt will be here >2MN stays until medically to be stable , will need placment      LOS: 15 days   Time spent: 50 minutes with more than 50% on COC    Lynn Ito, MD [facility] Pager [phone]  If 7PM-7AM, please contact night-coverage [email or web address] Password [email or web address] 08/13/2019, 7:19 AM

## 2019-08-13 NOTE — Progress Notes (Signed)
Critical lab NA 119. Paged information to Dr. Hollie Salk and made aware cortrac was no longer in place. Order was already placed by MD for Sodium lab draw at 1800 time collect prior to this result.

## 2019-08-14 ENCOUNTER — Inpatient Hospital Stay (HOSPITAL_COMMUNITY): Payer: Medicaid Other

## 2019-08-14 LAB — SODIUM
Sodium: 121 mmol/L — ABNORMAL LOW (ref 135–145)
Sodium: 124 mmol/L — ABNORMAL LOW (ref 135–145)
Sodium: 126 mmol/L — ABNORMAL LOW (ref 135–145)
Sodium: 125 mmol/L — ABNORMAL LOW (ref 135–145)

## 2019-08-14 LAB — CBC
HCT: 41.2 % (ref 39.0–52.0)
Hemoglobin: 14.8 g/dL (ref 13.0–17.0)
MCH: 30 pg (ref 26.0–34.0)
MCHC: 35.9 g/dL (ref 30.0–36.0)
MCV: 83.6 fL (ref 80.0–100.0)
Platelets: 337 10*3/uL (ref 150–400)
RBC: 4.93 MIL/uL (ref 4.22–5.81)
RDW: 13.2 % (ref 11.5–15.5)
WBC: 2.2 10*3/uL — ABNORMAL LOW (ref 4.0–10.5)
nRBC: 0 % (ref 0.0–0.2)

## 2019-08-14 LAB — BASIC METABOLIC PANEL
Anion gap: 12 (ref 5–15)
BUN: 22 mg/dL — ABNORMAL HIGH (ref 6–20)
CO2: 23 mmol/L (ref 22–32)
Calcium: 9.3 mg/dL (ref 8.9–10.3)
Chloride: 86 mmol/L — ABNORMAL LOW (ref 98–111)
Creatinine, Ser: 1.13 mg/dL (ref 0.61–1.24)
GFR calc Af Amer: >60 mL/min (ref >60)
GFR calc non Af Amer: >60 mL/min (ref >60)
Glucose, Bld: 95 mg/dL (ref 70–99)
Potassium: 4.1 mmol/L (ref 3.5–5.1)
Sodium: 121 mmol/L — ABNORMAL LOW (ref 135–145)

## 2019-08-14 LAB — GLUCOSE, CAPILLARY
Glucose-Capillary: 140 mg/dL — ABNORMAL HIGH (ref 70–99)
Glucose-Capillary: 79 mg/dL (ref 70–99)
Glucose-Capillary: 88 mg/dL (ref 70–99)
Glucose-Capillary: 98 mg/dL (ref 70–99)

## 2019-08-14 IMAGING — RF DG ABDOMEN 1V
1 series · 1 of 1 positions shown · non-contrast
Comparison: Radiograph dated [DATE].

CLINICAL DATA: 38-year-old male status post feeding tube placement.

EXAM:
ABDOMEN - 1 VIEW

[Series 1: cp_standard · 0.25mm/px · 1 of 1 slices shown]
[im 1/1]
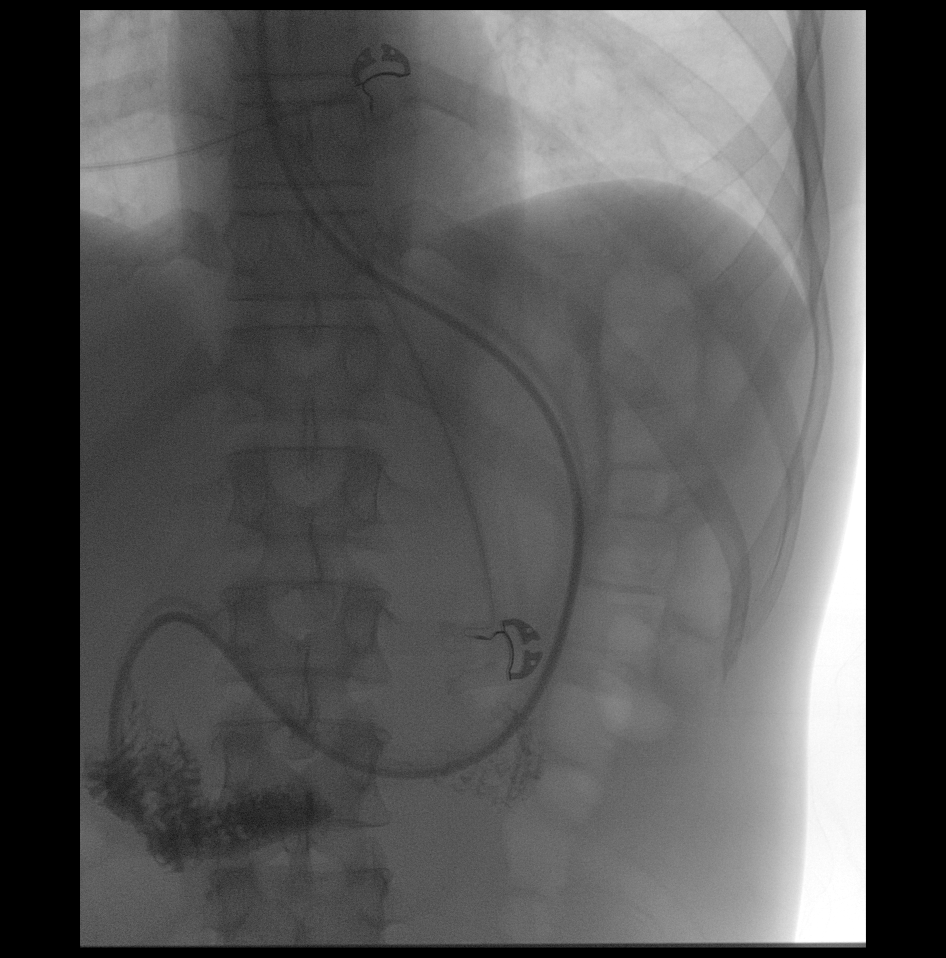

[1 of 1 positions shown; findings below may reference images not displayed]

FINDINGS: Feeding tube with tip in the distal second portion of the duodenum.
Contrast injected through the tube opacifies the second and third
portions of the duodenum.
IMPRESSION: Feeding tube with tip in the second portion of the duodenum.

## 2019-08-14 MED ORDER — FAMOTIDINE 40 MG/5ML PO SUSR
20.0000 mg | Freq: Every day | ORAL | Status: DC
  Administered 2019-08-15 – 2019-09-07 (×23): 20 mg
  Filled 2019-08-14 (×24): qty 2.5

## 2019-08-14 MED ORDER — IOHEXOL 300 MG/ML  SOLN
25.0000 mL | Freq: Once | INTRAMUSCULAR | Status: AC | PRN
  Administered 2019-08-14: 25 mL

## 2019-08-14 MED ORDER — DEMECLOCYCLINE HCL 150 MG PO TABS
150.0000 mg | ORAL_TABLET | Freq: Two times a day (BID) | ORAL | Status: DC
  Administered 2019-08-14 – 2019-08-19 (×11): 150 mg
  Filled 2019-08-14 (×11): qty 1

## 2019-08-14 MED ORDER — FLUCONAZOLE 200 MG PO TABS
200.0000 mg | ORAL_TABLET | Freq: Every day | ORAL | Status: AC
  Administered 2019-08-14 – 2019-08-18 (×5): 200 mg
  Filled 2019-08-14 (×5): qty 1

## 2019-08-14 MED ORDER — DEMECLOCYCLINE HCL 150 MG PO TABS
150.0000 mg | ORAL_TABLET | Freq: Two times a day (BID) | ORAL | Status: DC
  Filled 2019-08-14: qty 1

## 2019-08-14 MED ORDER — LIDOCAINE VISCOUS HCL 2 % MT SOLN
3.0000 mL | Freq: Once | OROMUCOSAL | Status: AC
  Administered 2019-08-14: 3 mL via OROMUCOSAL

## 2019-08-14 MED ORDER — SODIUM CHLORIDE 1 G PO TABS
1.0000 g | ORAL_TABLET | Freq: Three times a day (TID) | ORAL | Status: DC
  Administered 2019-08-14 – 2019-08-17 (×11): 1 g
  Filled 2019-08-14 (×12): qty 1

## 2019-08-14 NOTE — Progress Notes (Signed)
Palliative: Chart reviewed, Arthur [name] remains stable.  Palliative team to continue to follow, will reengage if declines.  Plan:  Arthur [name] is slowly improving, goals are set for full scope/full code.  Anticipate possible discharge to residential SNF.   Discharge options being reviewed.   No charge  Quinn Axe, NP Palliative Medicine Team Team Phone # [phone] Greater than 50% of this time was spent counseling and coordinating care related to the above assessment and plan.

## 2019-08-14 NOTE — Progress Notes (Signed)
  Speech Language Pathology Treatment: Dysphagia  Patient Details Name: Arthur [name] MRN: [id number] DOB: [birth year] Today's Date: 08/14/2019 Time: 0923-3007 SLP Time Calculation (min) (ACUTE ONLY): 15 min  Assessment / Plan / Recommendation Clinical Impression  Pt has an audibly wet vocal quality and although he coughs on command, he does not cough hard enough to get his secretions with the yankauer. After oral care, SLP provided ice chips with no initiation of swallow despite cues (pt shook his head "no" that he couldn't), but this did help him to better eject secretions with moderate-large amount removed. His voice was not entirely clear after this, but it did sound less wet. Pt seemed to fatigue quickly and said that he could not do any more trials. Will continue to follow, but pt should still remain NPO.   HPI HPI: Pt is a 38 y.o. male admitted with neurosyphillis, CNS toxoplasmosis. MRI 11/1 showed widespread areas concerning for infection, with most dominant abnormality in the L anterior frontal white matter. Hospital course was complicated by hyponatremia and respiratory decline, transferred to ICU on 11/14 but not requiring intubation. Multiple CXRs this admission without acute findings. PMH: substance abuse, HIV, GERD, depression, anxiety, anemia      SLP Plan  Continue with current plan of care       Recommendations  Diet recommendations: NPO Medication Administration: Via alternative means                Oral Care Recommendations: Oral care QID Follow up Recommendations: 24 hour supervision/assistance SLP Visit Diagnosis: Dysphagia, oropharyngeal phase (R13.12) Plan: Continue with current plan of care       GO                Venita Sheffield Catie Chiao 08/14/2019, 9:42 AM  Pollyann Glen, M.A. [facility] Environmental education officer [phone] Office [phone]

## 2019-08-14 NOTE — Progress Notes (Signed)
NUTRITION NOTE  RN note from 27 yesterday states that she entered room and patient's Cortrak tube was out and patient was unaware that it had come out. The Cortrak team was unable to replace the tube yesterday due to the volume of consults received yesterday. Diagnostics/IR was no longer available to place a small bore tube at that time in the day.   Messaged MD a short time ago to make her aware of these items and that Cortrak service will be available again on Friday or Diagnostics/IR can be consulted today to replace small bore tube.  RD will continue to follow.    Jarome Matin, MS, RD, LDN, [facility] Inpatient Clinical Dietitian Pager # [phone] After hours/weekend pager # [phone]

## 2019-08-14 NOTE — Progress Notes (Signed)
PROGRESS NOTE    Arthur [name]  ZOX:[id number] DOB: 24-Jul-1981 DOA: 07/28/2019 PCP: Randall Hiss, MD     Brief Narrative:  Arthur [name] is a 38 year old male with past medical history significant for HIV, substance abuse, depression and GERD who presented to the emergency department on 11/1 with chief complaint of dizziness, weakness and intermittent headache. He has not taken any of his HIV medications for about 1 year due to affordability issue. MRI brain done in ED showed widespread areas of abnormal low level restricted diffusion and postcontrast enhancement throughout both cerebral hemispheres, predominantly infratentorial, with the dominant abnormality in the left anterior frontal white matter consistent with an opportunistic infection, such as toxoplasmosis, tuberculosis or fungal cerebritis/meningitis, or parasitic infection. Patient was admitted to hospital service and ID was consulted. He then revealed that he has lost his vision in his right eye. ID started him on penicillin and ganciclovir for possible CMV retinitis versus synovitis retinitis and ophthalmology consulted.  11/3: status post lumbar puncture by neurology 11/15: transferred to ICU for tx of severe hyponatremia/SIADH for tx with hypertonic saline as his mental status was worse, and respiratory status was declining 11/16: transferred to SDU  New events last 24 hours / Subjective: Had accidentally removed cortrak overnight.  IR consulted for small bore tube placement for feeding as cortrak team not available until Friday.  Patient seen and examined, is alert but does not interact much or answer my questions.  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 -TB gold plus negative.MTB probenegative. RIPEtx stopped -ID following-recommend continuing penicillin for 14 days  -IV pen G --> IM 2.5 million unit of Bicillin to decrease IV as much as possible  -Continue Bactrim   Acute toxic metabolic encephalopathy, multifactorial including CNS infection and SIADH -Currently stable  Right eye blindness, optic retinitis/neurititis -Mainly concerned between syphilis and CMV-butCMV PCR negative in the CNS -Ophtho has consulted   -Received steroid injection 08/02/2019 -Repeat MRIbrain and orbits show improvement of brain lesions and optic neurititis, retinitis  Severehyponatremia -Likely 2/2severeSIADH -Transferred to ICU for 3% saline and IV lasix -Nephrology following -Monitor sodium level closely, 121 this morning -Started on Declomycin -Continue IV Lasix, salt tab  Leukopenia/immunocompromised in the setting of untreated HIV -Fluconazole for thrush  Untreated HIV -ID following  Dysphagia -Seen by SLP -Tube feeding managed by dietitian   Severe protein calorie malnutrition -Tube feeding managed by dietitian   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 Seen by palliative care. Currently recommending continuing aggressive measures   DVT prophylaxis: Lovenox Code Status: Full Family Communication: None at bedside Disposition Plan: Continue inpatient treatment      Antimicrobials:  Anti-infectives (From admission, onward)   Start     Dose/Rate Route Frequency Ordered Stop   08/14/19 1000  demeclocycline (DECLOMYCIN) tablet 150 mg  Status:  Discontinued     150 mg Oral Every 12 hours 08/14/19 0848 08/14/19 0907   08/14/19 1000  demeclocycline (DECLOMYCIN) tablet 150  mg     150  mg Per Tube Every 12 hours 08/14/19 0907     08/14/19 1000  fluconazole (DIFLUCAN) tablet 200 mg     200 mg Per Tube Daily 08/14/19 0907 08/19/19 0959   08/13/19 1015  fluconazole (DIFLUCAN) tablet 200 mg  Status:  Discontinued     200 mg Oral Daily 08/13/19 1009 08/14/19 0907   08/13/19 1000  penicillin g benzathine (BICILLIN LA) 1200000 UNIT/2ML injection 2.4 Million Units     2.4 Million Units Intramuscular  Once 08/08/19 1019 08/13/19 1222   08/12/19 1400  sulfamethoxazole-trimethoprim (BACTRIM) 272.48 mg in dextrose 5 % 500 mL IVPB     10 mg/kg/day  54.5 kg 344.7 mL/hr over 90 Minutes Intravenous Every 12 hours 08/12/19 0854     08/11/19 1400  sulfamethoxazole-trimethoprim (BACTRIM) 400-80 MG per tablet 3 tablet  Status:  Discontinued     3 tablet Oral Every 8 hours 08/11/19 1349 08/12/19 0854   08/10/19 1000  abacavir-dolutegravir-lamiVUDine (TRIUMEQ) 600-50-300 MG per tablet 1 tablet  Status:  Discontinued     1 tablet Oral Daily 08/09/19 1514 08/12/19 0848   08/10/19 1000  sulfamethoxazole-trimethoprim (BACTRIM) 160 mg in dextrose 5 % 250 mL IVPB  Status:  Discontinued     160 mg 260 mL/hr over 60 Minutes Intravenous Daily 08/09/19 1514 08/11/19 1324   08/09/19 1730  metroNIDAZOLE (FLAGYL) IVPB 500 mg     500 mg 100 mL/hr over 60 Minutes Intravenous Every 8 hours 08/09/19 1636 08/13/19 1848   08/08/19 1000  rifampin (RIFADIN) 60 mg/mL oral suspension 600 mg  Status:  Discontinued     600 mg Per Tube Daily 08/07/19 1106 08/07/19 1319   08/08/19 1000  bictegravir-emtricitabine-tenofovir AF (BIKTARVY) 50-200-25 MG per tablet 1 tablet  Status:  Discontinued     1 tablet Oral Daily 08/07/19 1419 08/09/19 1514   08/07/19 1330  bictegravir-emtricitabine-tenofovir AF (BIKTARVY) 50-200-25 MG per tablet 1 tablet  Status:  Discontinued     1 tablet Oral Daily 08/07/19 1319 08/07/19 1419   08/07/19 1000  ethambutol (MYAMBUTOL) tablet 1,200 mg  Status:  Discontinued     1,200 mg  Per Tube Daily 08/06/19 1423 08/07/19 1319   08/07/19 1000  isoniazid (NYDRAZID) tablet 300 mg  Status:  Discontinued     300 mg Per Tube Daily 08/06/19 1423 08/07/19 1319   08/07/19 1000  rifampin (RIFADIN) 60 mg/mL oral suspension 600 mg  Status:  Discontinued     600 mg Oral Daily 08/06/19 1423 08/07/19 1106   08/07/19 1000  sulfamethoxazole-trimethoprim (BACTRIM DS) 800-160 MG per tablet 1 tablet  Status:  Discontinued     1 tablet Per Tube Daily 08/06/19 1423 08/09/19 1514   08/07/19 1000  pyrazinamide tablet 1,500 mg  Status:  Discontinued     1,500 mg Per Tube Daily 08/06/19 1423 08/07/19 1319   08/03/19 1600  fluconazole (DIFLUCAN) IVPB 200 mg  Status:  Discontinued     200 mg 100 mL/hr over 60 Minutes Intravenous Every 24 hours 08/03/19 1548 08/13/19 1009   07/30/19 1700  rifampin (RIFADIN) capsule 600 mg  Status:  Discontinued     600 mg Oral Daily 07/30/19 1604 08/06/19 1423   07/30/19 1700  isoniazid (NYDRAZID) tablet 300 mg  Status:  Discontinued     300 mg Oral Daily 07/30/19 1604 08/06/19 1423   07/30/19 1700  pyrazinamide tablet 1,500 mg  Status:  Discontinued     1,500 mg Oral Daily 07/30/19 1604 08/06/19 1423   07/30/19 1700  ethambutol (  MYAMBUTOL) tablet 1,200 mg  Status:  Discontinued     1,200 mg Oral Daily 07/30/19 1604 08/06/19 1423   07/29/19 1700  sulfamethoxazole-trimethoprim (BACTRIM DS) 800-160 MG per tablet 1 tablet  Status:  Discontinued     1 tablet Oral Daily 07/29/19 1601 08/06/19 1423   07/29/19 1700  ganciclovir (CYTOVENE) 285 mg in sodium chloride 0.9 % 100 mL IVPB  Status:  Discontinued     5 mg/kg  57 kg 100 mL/hr over 60 Minutes Intravenous Every 12 hours 07/29/19 1603 08/07/19 1526   07/29/19 1630  penicillin G potassium 12 Million Units in dextrose 5 % 250 mL IVPB  Status:  Discontinued     12 Million Units 250 mL/hr over 60 Minutes Intravenous Every 12 hours 07/29/19 1552 07/29/19 1559   07/29/19 1630  penicillin G potassium 12 Million Units in  dextrose 5 % 500 mL continuous infusion     12 Million Units 41.7 mL/hr over 12 Hours Intravenous Every 12 hours 07/29/19 1559 08/12/19 2359        Objective: Vitals:   08/14/19 0809 08/14/19 0925 08/14/19 1000 08/14/19 1100  BP: 102/60     Pulse:  89 89 99  Resp: 18 (!) 21 19 (!) 22  Temp:      TempSrc:      SpO2:  93% 92% 95%  Weight:      Height:        Intake/Output Summary (Last 24 hours) at 08/14/2019 1145 Last data filed at 08/14/2019 1100 Gross per 24 hour  Intake 1704.89 ml  Output 2205 ml  Net -500.11 ml   Filed Weights   08/09/19 0338 08/13/19 0700 08/14/19 0500  Weight: 54.5 kg 54.7 kg 54.7 kg    Examination:  General exam: Appears calm and comfortable  Respiratory system: Clear to auscultation. Respiratory effort normal. No respiratory distress. No conversational dyspnea.  Cardiovascular system: S1 & S2 heard, RRR. No murmurs. No pedal edema. Gastrointestinal system: Abdomen is nondistended, soft and nontender. Normal bowel sounds heard. Central nervous system: Alert Extremities: Symmetric in appearance  Skin: No rashes, lesions or ulcers on exposed skin  Psychiatry: Stable    Data Reviewed: I have personally reviewed following labs and imaging studies  CBC: Recent Labs  Lab 08/09/19 0333 08/10/19 0626 08/12/19 1020 08/13/19 0932 08/14/19 0230  WBC 2.2* 2.7* 2.6* 2.1* 2.2*  NEUTROABS  --   --  1.5* 1.1*  --   HGB 13.9 14.6 14.5 13.9 14.8  HCT 39.5 41.3 40.9 38.9* 41.2  MCV 84.9 84.6 84.9 84.6 83.6  PLT 305 321 334 323 337   Basic Metabolic Panel: Recent Labs  Lab 08/07/19 1652  08/09/19 0333  08/10/19 0626  08/12/19 1020  08/13/19 0932 08/13/19 1456 08/13/19 1959 08/14/19 0230 08/14/19 0944  NA  --    < > 120*   < > 118*   < > 125*   < > 118* 119* 122* 121* 121*  K  --    < > 4.9  --  5.0  --  4.5  --  4.6  --   --  4.1  --   CL  --    < > 88*  --  84*  --  88*  --  86*  --   --  86*  --   CO2  --    < > 23  --  22  --  22  --   23  --   --  23  --   GLUCOSE  --    < > 96  --  109*  --  108*  --  98  --   --  95  --   BUN  --    < > 14  --  16  --  16  --  17  --   --  22*  --   CREATININE  --    < > 0.68  --  0.69  --  1.05  --  1.01  --   --  1.13  --   CALCIUM  --    < > 9.1  --  9.6  --  9.6  --  9.1  --   --  9.3  --   MG 1.8  --   --   --   --   --   --   --   --   --   --   --   --   PHOS 3.1  --   --   --   --   --   --   --   --   --   --   --   --    < > = values in this interval not displayed.   GFR: Estimated Creatinine Clearance: 69.9 mL/min (by C-G formula based on SCr of 1.13 mg/dL). Liver Function Tests: Recent Labs  Lab 08/10/19 0626 08/12/19 1020 08/13/19 0932  AST 26 25 29   ALT 17 18 18   ALKPHOS 51 45 48  BILITOT 0.3 0.3 0.4  PROT 8.9* 8.7* 8.4*  ALBUMIN 3.1* 3.1* 2.9*   No results for input(s): LIPASE, AMYLASE in the last 168 hours. No results for input(s): AMMONIA in the last 168 hours. Coagulation Profile: No results for input(s): INR, PROTIME in the last 168 hours. Cardiac Enzymes: No results for input(s): CKTOTAL, CKMB, CKMBINDEX, TROPONINI in the last 168 hours. BNP (last 3 results) No results for input(s): PROBNP in the last 8760 hours. HbA1C: No results for input(s): HGBA1C in the last 72 hours. CBG: Recent Labs  Lab 08/13/19 1044 08/13/19 1653 08/13/19 2025 08/14/19 0012 08/14/19 0414  GLUCAP 103* 111* 106* 79 88   Lipid Profile: No results for input(s): CHOL, HDL, LDLCALC, TRIG, CHOLHDL, LDLDIRECT in the last 72 hours. Thyroid Function Tests: No results for input(s): TSH, T4TOTAL, FREET4, T3FREE, THYROIDAB in the last 72 hours. Anemia Panel: No results for input(s): VITAMINB12, FOLATE, FERRITIN, TIBC, IRON, RETICCTPCT in the last 72 hours. Sepsis Labs: Recent Labs  Lab 08/07/19 2350 08/08/19 0329  LATICACIDVEN 0.6 1.1    Recent Results (from the past 240 hour(s))  MRSA PCR Screening     Status: None   Collection Time: 08/10/19  1:35 PM   Specimen:  Nasopharyngeal  Result Value Ref Range Status   MRSA by PCR NEGATIVE NEGATIVE Final    Comment:        The GeneXpert MRSA Assay (FDA approved for NASAL specimens only), is one component of a comprehensive MRSA colonization surveillance program. It is not intended to diagnose MRSA infection nor to guide or monitor treatment for MRSA infections. Performed at [facility], [address], [place], [address] [place]       Radiology Studies: Dg Abd 1 View  Result Date: 08/14/2019 CLINICAL DATA:  38 year old male status post feeding tube placement. EXAM: ABDOMEN - 1 VIEW COMPARISON:  Radiograph dated 08/05/2019. FINDINGS: Feeding tube with tip in the distal second portion  of the duodenum. Contrast injected through the tube opacifies the second and third portions of the duodenum. IMPRESSION: Feeding tube with tip in the second portion of the duodenum. Electronically Signed   By: Anner Crete M.D.   On: 08/14/2019 09:27      Scheduled Meds: . Chlorhexidine Gluconate Cloth  6 each Topical Daily  . demeclocycline  150 mg Per Tube Q12H  . enoxaparin (LOVENOX) injection  40 mg Subcutaneous Q24H  . feeding supplement (OSMOLITE 1.5 CAL)  1,000 mL Per Tube Q24H  . feeding supplement (PRO-STAT SUGAR FREE 64)  30 mL Per Tube TID  . fluconazole  200 mg Per Tube Daily  . furosemide  80 mg Intravenous BID  . magic mouthwash w/lidocaine  10 mL Oral TID AC & HS  . mouth rinse  15 mL Mouth Rinse BID  . multivitamin with minerals  1 tablet Per Tube Daily  . sodium chloride flush  3 mL Intravenous Once  . sodium chloride  1 g Per Tube TID WC   Continuous Infusions: . chlorproMAZINE (THORAZINE) IV 12.5 mg (08/09/19 1135)  . famotidine (PEPCID) IV 20 mg (08/14/19 1104)  . sulfamethoxazole-trimethoprim 272.48 mg (08/14/19 0953)     LOS: 16 days      Time spent: 45 minutes   Dessa Phi, DO [facility] 08/14/2019, 11:45 AM   Available via Epic secure chat 7am-7pm  After these hours, please refer to coverage provider listed on [email or web address]

## 2019-08-14 NOTE — Progress Notes (Signed)
Cortrak Tube Team Note:  Small bore feeding tube placed by diagnostic radiology. RD secure in L nare at 100 cm marking with bridle.    Mariana Single RD, LDN Clinical Nutrition Pager # [phone]

## 2019-08-14 NOTE — Progress Notes (Signed)
[facility] Progress Note    Assessment/ Plan:   1. 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  Initially on 3% saline, now off and on IV Lasix 80 IV BID, salt tablets added yesterday 1g TID but pulled out coretrack so only got one dose.  Being replaced this AM.  Will add demeclocycline 150 BID to regimen, continue q 4 Na checks.  Other option if demeclocycline isn't successful is tolvaptan but this can be unpredictable in its effects and I have concerns re: overcorrection. 2.   AMS/Encepahlopathy: numerous causes including #1--> Neurosyphilis and toxo being treated per ID 3. Neurosyphilis-- IV pen G and today 11/17 per ID note being transitioned to IM Bicillin--> this will help with IV obligate intake  4. Toxoplasmosis- on Bactrim and awaiting pyrimethamine-leucovorin and sulfadiazine from pharmacy 5. FTT: Tube Feeds 6. AIDS-- Off Triumeq per ID 7. Dispo: palliative care following- appreciate assistance  Subjective:    Pulled out Coretrack yesterday.  Na hovering around 120-122 even with increased Lasix and addition of salt tablets.  Opens eyes to my voice today.   Objective:   BP 102/60   Pulse 82   Temp 97.9 F (36.6 C) (Oral)   Resp 18   Ht 5\' 11"  (1.803 m)   Wt 54.7 kg   SpO2 97%   BMI 16.82 kg/m   Intake/Output Summary (Last 24 hours) at 08/14/2019 0849 Last data filed at 08/14/2019 0300 Gross per 24 hour  Intake 1329.4 ml  Output 2725 ml  Net -1395.6 ml   Weight change: 0 kg  Physical Exam: GEN opens eyes to voice today, cachectic HEENT: significant temporal wasting NECK: no JVD CV: RRR no m/r/g PULM: normal WOB, clear bilaterally no c/w/r EXT: No edema  Imaging: No results found.  Labs: BMET Recent Labs  Lab 08/07/19 1652  08/08/19 0329 08/09/19 0333  08/10/19 0626  08/12/19 1020  08/12/19 2213 08/13/19 0207 08/13/19 0619 08/13/19 0932 08/13/19 1456 08/13/19 1959  08/14/19 0230  NA  --    < > 124* 120*   < > 118*   < > 125*   < > 122* 120* 121* 118* 119* 122* 121*  K  --   --  4.7 4.9  --  5.0  --  4.5  --   --   --   --  4.6  --   --  4.1  CL  --   --  94* 88*  --  84*  --  88*  --   --   --   --  86*  --   --  86*  CO2  --   --  23 23  --  22  --  22  --   --   --   --  23  --   --  23  GLUCOSE  --   --  111* 96  --  109*  --  108*  --   --   --   --  98  --   --  95  BUN  --   --  14 14  --  16  --  16  --   --   --   --  17  --   --  22*  CREATININE  --   --  0.74 0.68  --  0.69  --  1.05  --   --   --   --  1.01  --   --  1.13  CALCIUM  --   --  9.2 9.1  --  9.6  --  9.6  --   --   --   --  9.1  --   --  9.3  PHOS 3.1  --   --   --   --   --   --   --   --   --   --   --   --   --   --   --    < > = values in this interval not displayed.   CBC Recent Labs  Lab 08/10/19 0626 08/12/19 1020 08/13/19 0932 08/14/19 0230  WBC 2.7* 2.6* 2.1* 2.2*  NEUTROABS  --  1.5* 1.1*  --   HGB 14.6 14.5 13.9 14.8  HCT 41.3 40.9 38.9* 41.2  MCV 84.6 84.9 84.6 83.6  PLT 321 334 323 337    Medications:    . Chlorhexidine Gluconate Cloth  6 each Topical Daily  . demeclocycline  150 mg Oral Q12H  . enoxaparin (LOVENOX) injection  40 mg Subcutaneous Q24H  . feeding supplement (OSMOLITE 1.5 CAL)  1,000 mL Per Tube Q24H  . feeding supplement (PRO-STAT SUGAR FREE 64)  30 mL Per Tube TID  . fluconazole  200 mg Oral Daily  . furosemide  80 mg Intravenous BID  . magic mouthwash w/lidocaine  10 mL Oral TID AC & HS  . mouth rinse  15 mL Mouth Rinse BID  . multivitamin with minerals  1 tablet Per Tube Daily  . sodium chloride flush  3 mL Intravenous Once  . sodium chloride  1 g Oral TID WC      Bufford Buttner, MD 08/14/2019, 8:49 AM

## 2019-08-15 LAB — CBC
HCT: 43.9 % (ref 39.0–52.0)
Hemoglobin: 15.5 g/dL (ref 13.0–17.0)
MCH: 30.2 pg (ref 26.0–34.0)
MCHC: 35.3 g/dL (ref 30.0–36.0)
MCV: 85.4 fL (ref 80.0–100.0)
Platelets: 303 10*3/uL (ref 150–400)
RBC: 5.14 MIL/uL (ref 4.22–5.81)
RDW: 13.3 % (ref 11.5–15.5)
WBC: 2.1 10*3/uL — ABNORMAL LOW (ref 4.0–10.5)
nRBC: 0 % (ref 0.0–0.2)

## 2019-08-15 LAB — BASIC METABOLIC PANEL
Anion gap: 10 (ref 5–15)
BUN: 29 mg/dL — ABNORMAL HIGH (ref 6–20)
CO2: 25 mmol/L (ref 22–32)
Calcium: 9 mg/dL (ref 8.9–10.3)
Chloride: 92 mmol/L — ABNORMAL LOW (ref 98–111)
Creatinine, Ser: 1.15 mg/dL (ref 0.61–1.24)
GFR calc Af Amer: >60 mL/min (ref >60)
GFR calc non Af Amer: >60 mL/min (ref >60)
Glucose, Bld: 131 mg/dL — ABNORMAL HIGH (ref 70–99)
Potassium: 5.2 mmol/L — ABNORMAL HIGH (ref 3.5–5.1)
Sodium: 127 mmol/L — ABNORMAL LOW (ref 135–145)

## 2019-08-15 LAB — SODIUM
Sodium: 124 mmol/L — ABNORMAL LOW (ref 135–145)
Sodium: 125 mmol/L — ABNORMAL LOW (ref 135–145)
Sodium: 125 mmol/L — ABNORMAL LOW (ref 135–145)
Sodium: 126 mmol/L — ABNORMAL LOW (ref 135–145)

## 2019-08-15 LAB — GLUCOSE, CAPILLARY
Glucose-Capillary: 113 mg/dL — ABNORMAL HIGH (ref 70–99)
Glucose-Capillary: 119 mg/dL — ABNORMAL HIGH (ref 70–99)
Glucose-Capillary: 119 mg/dL — ABNORMAL HIGH (ref 70–99)
Glucose-Capillary: 123 mg/dL — ABNORMAL HIGH (ref 70–99)
Glucose-Capillary: 152 mg/dL — ABNORMAL HIGH (ref 70–99)
Glucose-Capillary: 159 mg/dL — ABNORMAL HIGH (ref 70–99)
Glucose-Capillary: 160 mg/dL — ABNORMAL HIGH (ref 70–99)
Glucose-Capillary: 203 mg/dL — ABNORMAL HIGH (ref 70–99)

## 2019-08-15 MED ORDER — INSULIN ASPART 100 UNIT/ML ~~LOC~~ SOLN
0.0000 [IU] | SUBCUTANEOUS | Status: DC
  Administered 2019-08-15 – 2019-08-16 (×3): 2 [IU] via SUBCUTANEOUS
  Administered 2019-08-16: 1 [IU] via SUBCUTANEOUS
  Administered 2019-08-16 (×2): 2 [IU] via SUBCUTANEOUS
  Administered 2019-08-17: 1 [IU] via SUBCUTANEOUS
  Administered 2019-08-17: 2 [IU] via SUBCUTANEOUS
  Administered 2019-08-17 (×2): 1 [IU] via SUBCUTANEOUS
  Administered 2019-08-17: 2 [IU] via SUBCUTANEOUS
  Administered 2019-08-17 – 2019-08-18 (×3): 1 [IU] via SUBCUTANEOUS
  Administered 2019-08-18: 2 [IU] via SUBCUTANEOUS
  Administered 2019-08-18 – 2019-08-19 (×7): 1 [IU] via SUBCUTANEOUS
  Administered 2019-08-19: 2 [IU] via SUBCUTANEOUS
  Administered 2019-08-19 – 2019-08-20 (×3): 1 [IU] via SUBCUTANEOUS
  Administered 2019-08-21: 2 [IU] via SUBCUTANEOUS
  Administered 2019-08-21 – 2019-09-02 (×11): 1 [IU] via SUBCUTANEOUS

## 2019-08-15 MED ORDER — PYRIMETHAMINE-LEUCOVORIN 50-25 MG PO CAPS
200.0000 mg | ORAL_CAPSULE | Freq: Once | ORAL | Status: DC
  Filled 2019-08-15: qty 4

## 2019-08-15 MED ORDER — PYRIMETHAMINE-LEUCOVORIN 50-25 MG PO CAPS
200.0000 mg | ORAL_CAPSULE | Freq: Once | ORAL | Status: AC
  Administered 2019-08-15: 200 mg via ORAL
  Filled 2019-08-15 (×2): qty 4

## 2019-08-15 MED ORDER — PYRIMETHAMINE-LEUCOVORIN 50-25 MG PO CAPS
50.0000 mg | ORAL_CAPSULE | Freq: Every day | ORAL | Status: DC
  Administered 2019-08-16 – 2019-08-19 (×4): 50 mg via ORAL
  Filled 2019-08-15 (×7): qty 1

## 2019-08-15 MED ORDER — SULFADIAZINE 500 MG PO TABS
1000.0000 mg | ORAL_TABLET | Freq: Four times a day (QID) | ORAL | Status: DC
  Administered 2019-08-15 – 2019-08-20 (×19): 1000 mg via ORAL
  Filled 2019-08-15 (×20): qty 2

## 2019-08-15 MED ORDER — PYRIMETHAMINE-LEUCOVORIN 50-25 MG PO CAPS: 50.0000 mg | ORAL_CAPSULE | Freq: Every day | ORAL | Status: DC

## 2019-08-15 NOTE — Plan of Care (Signed)

## 2019-08-15 NOTE — Progress Notes (Signed)
[facility]  Date of Admission:  07/28/2019     Total days of antibiotics 18 Pyrimethamine-leucovorin day 1 Sulfadiazine Day 1         ASSESSMENT:  Arthur [name] does not appear to be doing as well today and is less interactive than he has been previously. He has completed treatment for neurosyphilis with 14 days of penicillin G followed by 2.4 million units of Bicillin once. Received pyrimethamine-leucovorin from the pharmacy and started treatment for toxoplasmosis. Will need at least 6 weeks of acute therapy followed by maintenance therapy.   PLAN:  1. Continue current dose of pyrimethamine-leucovorin and sulfadiazine.  2. Continue tube feeds as tolerated for malnutrition.   Principal Problem:   Toxoplasmosis Active Problems:   Neurosyphilis   Acquired immunodeficiency syndrome (HCC)   Nausea and vomiting   Bradycardia   Hyponatremia   Vision loss   Abnormal brain MRI   Palliative care encounter   Thrush of mouth and esophagus (HCC)   Goals of care, counseling/discussion   . Chlorhexidine Gluconate Cloth  6 each Topical Daily  . demeclocycline  150 mg Per Tube Q12H  . enoxaparin (LOVENOX) injection  40 mg Subcutaneous Q24H  . famotidine  20 mg Per Tube Daily  . feeding supplement (OSMOLITE 1.5 CAL)  1,000 mL Per Tube Q24H  . feeding supplement (PRO-STAT SUGAR FREE 64)  30 mL Per Tube TID  . fluconazole  200 mg Per Tube Daily  . furosemide  80 mg Intravenous BID  . insulin aspart  0-9 Units Subcutaneous Q4H  . magic mouthwash w/lidocaine  10 mL Oral TID AC & HS  . mouth rinse  15 mL Mouth Rinse BID  . multivitamin with minerals  1 tablet Per Tube Daily  . Pyrimethamine-Leucovorin  200 mg Oral Once  . [START ON 08/16/2019] Pyrimethamine-Leucovorin  50 mg Oral Daily  . sodium chloride  1 g Per Tube TID WC  . sulfaDIAZINE  1,000 mg Oral Q6H    SUBJECTIVE:  Afebrile overnight with no acute events. Not interactive during visit today.  Pointed at wall during interview.    No Known Allergies   Review of Systems: Review of Systems  Unable to perform ROS: Medical condition      OBJECTIVE: Vitals:   08/15/19 0304 08/15/19 0714 08/15/19 0900 08/15/19 1046  BP: 101/72 100/66  105/72  Pulse: 80 82 88 94  Resp: (!) 21 (!) 22 19 14   Temp: 97.8 F (36.6 C) 97.8 F (36.6 C)  97.7 F (36.5 C)  TempSrc: Axillary Axillary  Axillary  SpO2: 94% 92% 95% 98%  Weight: 52.9 kg     Height:       Body mass index is 16.27 kg/m.  Physical Exam Constitutional:      General: He is not in acute distress.    Appearance: He is well-developed. He is ill-appearing.     Comments: Lying in bed with head of bed elevated; obtunded  Cardiovascular:     Rate and Rhythm: Normal rate and regular rhythm.     Heart sounds: Normal heart sounds.  Pulmonary:     Effort: Pulmonary effort is normal.     Breath sounds: Rhonchi present.  Skin:    General: Skin is warm and dry.     Lab Results Lab Results  Component Value Date   WBC 2.1 (L) 08/15/2019   HGB 15.5 08/15/2019   HCT 43.9 08/15/2019   MCV 85.4 08/15/2019   PLT  303 08/15/2019    Lab Results  Component Value Date   CREATININE 1.15 08/15/2019   BUN 29 (H) 08/15/2019   NA 125 (L) 08/15/2019   K 5.2 (H) 08/15/2019   CL 92 (L) 08/15/2019   CO2 25 08/15/2019    Lab Results  Component Value Date   ALT 18 08/13/2019   AST 29 08/13/2019   ALKPHOS 48 08/13/2019   BILITOT 0.4 08/13/2019     Microbiology: Recent Results (from the past 240 hour(s))  MRSA PCR Screening     Status: None   Collection Time: 08/10/19  1:35 PM   Specimen: Nasopharyngeal  Result Value Ref Range Status   MRSA by PCR NEGATIVE NEGATIVE Final    Comment:        The GeneXpert MRSA Assay (FDA approved for NASAL specimens only), is one component of a comprehensive MRSA colonization surveillance program. It is not intended to diagnose MRSA infection nor to guide or monitor treatment for MRSA  infections. Performed at [facility], [place] [address] [place], [place] [address]      Terri Piedra, [place] [facility] [phone] Pager  08/15/2019  3:56 PM

## 2019-08-15 NOTE — Progress Notes (Signed)
[facility] Progress Note    Assessment/ Plan:   1. 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  Initially on 3% saline, now off and on IV Lasix 80 IV BID, salt tablets added 11/17 1g TID but pulled out coretrack so only got one dose.  Na better with addition of demeclocycline 150 mg BID; continue Na checks q 4; TF restarted yesterday and anticipate demeclocycline dose may need to be increased. 2.   AMS/Encepahlopathy: numerous causes including #1--> Neurosyphilis and toxo being treated per ID 3. Neurosyphilis-- IV pen G and transitioned to IM Bicillin 4. 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 FTT: Tube Feeds 6. AIDS-- Off Triumeq per ID 7. Dispo: palliative care following- appreciate assistance  Subjective:    Coretrak reinserted yesterday, TF restarted, Na up to 126.  Eyes open today, asking for water.    Objective:   BP 100/66 (BP Location: Left Arm)   Pulse 82   Temp 97.8 F (36.6 C) (Axillary)   Resp (!) 22   Ht 5\' 11"  (1.803 m)   Wt 52.9 kg   SpO2 92%   BMI 16.27 kg/m   Intake/Output Summary (Last 24 hours) at 08/15/2019 0830 Last data filed at 08/15/2019 0700 Gross per 24 hour  Intake 2369.71 ml  Output 2265 ml  Net 104.71 ml   Weight change: -1.8 kg  Physical Exam: GEN awake, regards this examiner, asks for water HEENT: significant temporal wasting, Coretrak in place NECK: no JVD CV: RRR no m/r/g PULM: normal WOB, R sided rhonchi, deep wet cough  EXT: No edema  Imaging: Dg Abd 1 View  Result Date: 08/14/2019 CLINICAL DATA:  38 year old male status post feeding tube placement. EXAM: ABDOMEN - 1 VIEW COMPARISON:  Radiograph dated 08/05/2019. FINDINGS: Feeding tube with tip in the distal second portion of the duodenum. Contrast injected through the tube opacifies the second and third portions of the duodenum. IMPRESSION:  Feeding tube with tip in the second portion of the duodenum. Electronically Signed   By: 13/05/2019 M.D.   On: 08/14/2019 09:27    Labs: BMET Recent Labs  Lab 08/09/19 0333  08/10/19 0626  08/12/19 1020  08/13/19 0932  08/13/19 1959 08/14/19 0230 08/14/19 0944 08/14/19 1243 08/14/19 1805 08/14/19 2026 08/15/19 0025  NA 120*   < > 118*   < > 125*   < > 118*   < > 122* 121* 121* 124* 126* 125* 126*  K 4.9  --  5.0  --  4.5  --  4.6  --   --  4.1  --   --   --   --   --   CL 88*  --  84*  --  88*  --  86*  --   --  86*  --   --   --   --   --   CO2 23  --  22  --  22  --  23  --   --  23  --   --   --   --   --   GLUCOSE 96  --  109*  --  108*  --  98  --   --  95  --   --   --   --   --   BUN 14  --  16  --  16  --  17  --   --  22*  --   --   --   --   --   CREATININE 0.68  --  0.69  --  1.05  --  1.01  --   --  1.13  --   --   --   --   --   CALCIUM 9.1  --  9.6  --  9.6  --  9.1  --   --  9.3  --   --   --   --   --    < > = values in this interval not displayed.   CBC Recent Labs  Lab 08/10/19 0626 08/12/19 1020 08/13/19 0932 08/14/19 0230  WBC 2.7* 2.6* 2.1* 2.2*  NEUTROABS  --  1.5* 1.1*  --   HGB 14.6 14.5 13.9 14.8  HCT 41.3 40.9 38.9* 41.2  MCV 84.6 84.9 84.6 83.6  PLT 321 334 323 337    Medications:    . Chlorhexidine Gluconate Cloth  6 each Topical Daily  . demeclocycline  150 mg Per Tube Q12H  . enoxaparin (LOVENOX) injection  40 mg Subcutaneous Q24H  . famotidine  20 mg Per Tube Daily  . feeding supplement (OSMOLITE 1.5 CAL)  1,000 mL Per Tube Q24H  . feeding supplement (PRO-STAT SUGAR FREE 64)  30 mL Per Tube TID  . fluconazole  200 mg Per Tube Daily  . furosemide  80 mg Intravenous BID  . magic mouthwash w/lidocaine  10 mL Oral TID AC & HS  . mouth rinse  15 mL Mouth Rinse BID  . multivitamin with minerals  1 tablet Per Tube Daily  . sodium chloride  1 g Per Tube TID WC      Madelon Lips, MD 08/15/2019, 8:30 AM

## 2019-08-15 NOTE — Progress Notes (Signed)
Physical Therapy Treatment Patient Details Name: Arthur [name] MRN: [id number] DOB: October 25, 1980 Today's Date: 08/15/2019    History of Present Illness Arthur [name] is a 38 y.o. male with medical history significant of untreated HIV, anemia, depression, GERD, substance abuse presenting with c/o dizziness, headache and weakness.  He was admitted with hyponatremia, bradycardia and late syphilis.    PT Comments    Patient seen for mobility progression. Pt upon in chair upon arrival and pt's sister present. Pt agreeable to participate. Pt requires max A to stand and max A +2 for gait training of ~5 ft using RW. Pt communicated yes/no with head nods throughout session. Continue to progress as tolerated with anticipated d/c to SNF for further skilled PT services.     Follow Up Recommendations  SNF;Supervision/Assistance - 24 hour     Equipment Recommendations  Other (comment)(To be determined at next venue)    Recommendations for Other Services       Precautions / Restrictions Precautions Precautions: Fall Precaution Comments: watch BP, HR Restrictions Weight Bearing Restrictions: No    Mobility  Bed Mobility               General bed mobility comments: pt OOB in chair upon arrival  Transfers Overall transfer level: Needs assistance Equipment used: Rolling walker (2 wheeled) Transfers: Sit to/from Stand Sit to Stand: Max assist         General transfer comment: multimodal cues and assist for hand placement and positioning of bilat LE prior to standing; assistance reuqired to maintain trunk anterior and to power up into standing   Ambulation/Gait Ambulation/Gait assistance: +2 physical assistance;Max assist;+2 safety/equipment Gait Distance (Feet): (4-5) Assistive device: Rolling walker (2 wheeled) Gait Pattern/deviations: Step-to pattern;Decreased step length - right;Decreased step length - left;Narrow base of support;Scissoring Gait velocity: decr   General  Gait Details: assistance required for balance/weight shifting and LE placement; pt with tendency to scissor adn with R lateral bias   Stairs             Wheelchair Mobility    Modified Rankin (Stroke Patients Only)       Balance Overall balance assessment: Needs assistance Sitting-balance support: Feet supported;Bilateral upper extremity supported Sitting balance-Leahy Scale: Poor     Standing balance support: Bilateral upper extremity supported Standing balance-Leahy Scale: Zero                              Cognition Arousal/Alertness: Awake/alert Behavior During Therapy: Flat affect Overall Cognitive Status: Difficult to assess Area of Impairment: Problem solving;Safety/judgement;Following commands                       Following Commands: Follows one step commands inconsistently;Follows one step commands with increased time Safety/Judgement: Decreased awareness of deficits;Decreased awareness of safety   Problem Solving: Slow processing;Requires verbal cues;Requires tactile cues;Decreased initiation;Difficulty sequencing        Exercises      General Comments        Pertinent Vitals/Pain Pain Assessment: No/denies pain    Home Living                      Prior Function            PT Goals (current goals can now be found in the care plan section) Progress towards PT goals: Progressing toward goals    Frequency    Min 2X/week  PT Plan Current plan remains appropriate    Co-evaluation              AM-PAC PT "6 Clicks" Mobility   Outcome Measure  Help needed turning from your back to your side while in a flat bed without using bedrails?: A Lot Help needed moving from lying on your back to sitting on the side of a flat bed without using bedrails?: A Lot Help needed moving to and from a bed to a chair (including a wheelchair)?: A Lot Help needed standing up from a chair using your arms (e.g.,  wheelchair or bedside chair)?: A Lot Help needed to walk in hospital room?: A Lot Help needed climbing 3-5 steps with a railing? : Total 6 Click Score: 11    End of Session Equipment Utilized During Treatment: Gait belt Activity Tolerance: Patient tolerated treatment well Patient left: with call bell/phone within reach;in chair;with family/visitor present Nurse Communication: Mobility status PT Visit Diagnosis: Other abnormalities of gait and mobility (R26.89);Muscle weakness (generalized) (M62.81)     Time: 1430-1500 PT Time Calculation (min) (ACUTE ONLY): 30 min  Charges:  $Gait Training: 8-22 mins $Therapeutic Activity: 8-22 mins                     Earney Navy, PTA Acute Rehabilitation Services Pager: [phone] Office: [phone]     Darliss Cheney 08/15/2019, 5:17 PM

## 2019-08-15 NOTE — Progress Notes (Addendum)
PROGRESS NOTE    Arthur [name]  ZOX:[id number] DOB: [birth year] DOA: 07/28/2019 PCP: Randall Hiss, MD     Brief Narrative:  Arthur [name] is a 38 year old male with past medical history significant for HIV, substance abuse, depression and GERD who presented to the emergency department on 11/1 with chief complaint of dizziness, weakness and intermittent headache. He has not taken any of his HIV medications for about 1 year due to affordability issue. MRI brain done in ED showed widespread areas of abnormal low level restricted diffusion and postcontrast enhancement throughout both cerebral hemispheres, predominantly infratentorial, with the dominant abnormality in the left anterior frontal white matter consistent with an opportunistic infection, such as toxoplasmosis, tuberculosis or fungal cerebritis/meningitis, or parasitic infection. Patient was admitted to hospital service and ID was consulted. He then revealed that he has lost his vision in his right eye. ID started him on penicillin and ganciclovir for possible CMV retinitis versus synovitis retinitis and ophthalmology consulted.  11/3: status post lumbar puncture by neurology 11/15: transferred to ICU for tx of severe hyponatremia/SIADH for tx with hypertonic saline as his mental status was worse, and respiratory status was declining 11/16: transferred to SDU  New events last 24 hours / Subjective: No new complaints, able to tell me he does not have pain, no worsening SOB today.   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 RIPEtx stopped -Appreciate ID. Completed PCN for 14 days  -Continue Bactrim   Acute toxic metabolic encephalopathy, multifactorial including CNS infection and SIADH -Currently stable  Right eye blindness, optic retinitis/neurititis -Mainly concerned between syphilis and CMV-butCMV PCR negative in the CNS -Appreciate ophthalmology  -Received steroid injection 08/02/2019 -Repeat MRIbrain and orbits show improvement of brain lesions and optic neurititis, retinitis  Severehyponatremia -Likely 2/2severeSIADH -Transferred to ICU for 3% saline and IV lasix -Nephrology following -Monitor sodium level closely, 126 this morning -Continue IV Lasix, salt tab, Declomycin  Leukopenia/immunocompromised in the setting of untreated HIV -Fluconazole for thrush  Untreated HIV -ID following  Dysphagia -Seen by SLP -Tube feeding managed by dietitian   Severe protein calorie malnutrition -Tube feeding managed by dietitian   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 Seen by palliative care. Currently recommending continuing aggressive measures   DVT prophylaxis: Lovenox Code Status: Full Family Communication: None at bedside, discussed with mother over the phone Disposition Plan: Continue inpatient treatment. SNF when stable.      Antimicrobials:  Anti-infectives (From admission, onward)   Start     Dose/Rate Route Frequency Ordered Stop   08/14/19 1000  demeclocycline (DECLOMYCIN) tablet 150 mg  Status:  Discontinued     150 mg Oral Every 12 hours 08/14/19 0848 08/14/19 0907   08/14/19 1000  demeclocycline (DECLOMYCIN) tablet 150 mg     150 mg Per Tube Every 12 hours 08/14/19 0907     08/14/19 1000  fluconazole (DIFLUCAN) tablet 200 mg     200 mg Per Tube Daily 08/14/19 0907 08/19/19 0959   08/13/19  1015  fluconazole  (DIFLUCAN) tablet 200 mg  Status:  Discontinued     200 mg Oral Daily 08/13/19 1009 08/14/19 0907   08/13/19 1000  penicillin g benzathine (BICILLIN LA) 1200000 UNIT/2ML injection 2.4 Million Units     2.4 Million Units Intramuscular  Once 08/08/19 1019 08/13/19 1222   08/12/19 1400  sulfamethoxazole-trimethoprim (BACTRIM) 272.48 mg in dextrose 5 % 500 mL IVPB     10 mg/kg/day  54.5 kg 344.7 mL/hr over 90 Minutes Intravenous Every 12 hours 08/12/19 0854     08/11/19 1400  sulfamethoxazole-trimethoprim (BACTRIM) 400-80 MG per tablet 3 tablet  Status:  Discontinued     3 tablet Oral Every 8 hours 08/11/19 1349 08/12/19 0854   08/10/19 1000  abacavir-dolutegravir-lamiVUDine (TRIUMEQ) 600-50-300 MG per tablet 1 tablet  Status:  Discontinued     1 tablet Oral Daily 08/09/19 1514 08/12/19 0848   08/10/19 1000  sulfamethoxazole-trimethoprim (BACTRIM) 160 mg in dextrose 5 % 250 mL IVPB  Status:  Discontinued     160 mg 260 mL/hr over 60 Minutes Intravenous Daily 08/09/19 1514 08/11/19 1324   08/09/19 1730  metroNIDAZOLE (FLAGYL) IVPB 500 mg     500 mg 100 mL/hr over 60 Minutes Intravenous Every 8 hours 08/09/19 1636 08/13/19 1848   08/08/19 1000  rifampin (RIFADIN) 60 mg/mL oral suspension 600 mg  Status:  Discontinued     600 mg Per Tube Daily 08/07/19 1106 08/07/19 1319   08/08/19 1000  bictegravir-emtricitabine-tenofovir AF (BIKTARVY) 50-200-25 MG per tablet 1 tablet  Status:  Discontinued     1 tablet Oral Daily 08/07/19 1419 08/09/19 1514   08/07/19 1330  bictegravir-emtricitabine-tenofovir AF (BIKTARVY) 50-200-25 MG per tablet 1 tablet  Status:  Discontinued     1 tablet Oral Daily 08/07/19 1319 08/07/19 1419   08/07/19 1000  ethambutol (MYAMBUTOL) tablet 1,200 mg  Status:  Discontinued     1,200 mg Per Tube Daily 08/06/19 1423 08/07/19 1319   08/07/19 1000  isoniazid (NYDRAZID) tablet 300 mg  Status:  Discontinued     300 mg Per Tube Daily 08/06/19 1423 08/07/19 1319   08/07/19 1000   rifampin (RIFADIN) 60 mg/mL oral suspension 600 mg  Status:  Discontinued     600 mg Oral Daily 08/06/19 1423 08/07/19 1106   08/07/19 1000  sulfamethoxazole-trimethoprim (BACTRIM DS) 800-160 MG per tablet 1 tablet  Status:  Discontinued     1 tablet Per Tube Daily 08/06/19 1423 08/09/19 1514   08/07/19 1000  pyrazinamide tablet 1,500 mg  Status:  Discontinued     1,500 mg Per Tube Daily 08/06/19 1423 08/07/19 1319   08/03/19 1600  fluconazole (DIFLUCAN) IVPB 200 mg  Status:  Discontinued     200 mg 100 mL/hr over 60 Minutes Intravenous Every 24 hours 08/03/19 1548 08/13/19 1009   07/30/19 1700  rifampin (RIFADIN) capsule 600 mg  Status:  Discontinued     600 mg Oral Daily 07/30/19 1604 08/06/19 1423   07/30/19 1700  isoniazid (NYDRAZID) tablet 300 mg  Status:  Discontinued     300 mg Oral Daily 07/30/19 1604 08/06/19 1423   07/30/19 1700  pyrazinamide tablet 1,500 mg  Status:  Discontinued     1,500 mg Oral Daily 07/30/19 1604 08/06/19 1423   07/30/19 1700  ethambutol (MYAMBUTOL) tablet 1,200 mg  Status:  Discontinued     1,200 mg Oral Daily 07/30/19 1604 08/06/19 1423   07/29/19 1700  sulfamethoxazole-trimethoprim (BACTRIM DS) 800-160 MG per tablet 1 tablet  Status:  Discontinued     1 tablet Oral Daily 07/29/19 1601 08/06/19 1423   07/29/19 1700  ganciclovir (CYTOVENE) 285 mg in sodium chloride 0.9 % 100 mL IVPB  Status:  Discontinued     5 mg/kg  57 kg 100 mL/hr over 60 Minutes Intravenous Every 12 hours 07/29/19 1603 08/07/19 1526   07/29/19 1630  penicillin G potassium 12 Million Units in dextrose 5 % 250 mL IVPB  Status:  Discontinued     12 Million Units 250 mL/hr over 60 Minutes Intravenous Every 12 hours 07/29/19 1552 07/29/19 1559   07/29/19 1630  penicillin G potassium 12 Million Units in dextrose 5 % 500 mL continuous infusion     12 Million Units 41.7 mL/hr over 12 Hours Intravenous Every 12 hours 07/29/19 1559 08/12/19 2359       Objective: Vitals:   08/15/19 0013  08/15/19 0304 08/15/19 0714 08/15/19 0900  BP: 101/65 101/72 100/66   Pulse: 86 80 82 88  Resp:  (!) 21 (!) 22 19  Temp:  97.8 F (36.6 C) 97.8 F (36.6 C)   TempSrc:  Axillary Axillary   SpO2:  94% 92% 95%  Weight:  52.9 kg    Height:        Intake/Output Summary (Last 24 hours) at 08/15/2019 0941 Last data filed at 08/15/2019 0700 Gross per 24 hour  Intake 2369.71 ml  Output 2265 ml  Net 104.71 ml   Filed Weights   08/13/19 0700 08/14/19 0500 08/15/19 0304  Weight: 54.7 kg 54.7 kg 52.9 kg    Examination: General exam: Appears calm and comfortable  Respiratory system: Clear to auscultation. Respiratory effort normal. Cardiovascular system: S1 & S2 heard, RRR. No pedal edema. Gastrointestinal system: Abdomen is nondistended, soft and nontender. Normal bowel sounds heard. +small bore NG  Central nervous system: Alert. Non focal exam.  Extremities: Symmetric in appearance bilaterally  Skin: No rashes, lesions or ulcers on exposed skin  Psychiatry: Mood & affect appropriate.    Data Reviewed: I have personally reviewed following labs and imaging studies  CBC: Recent Labs  Lab 08/10/19 0626 08/12/19 1020 08/13/19 0932 08/14/19 0230 08/15/19 0754  WBC 2.7* 2.6* 2.1* 2.2* 2.1*  NEUTROABS  --  1.5* 1.1*  --   --   HGB 14.6 14.5 13.9 14.8 15.5  HCT 41.3 40.9 38.9* 41.2 43.9  MCV 84.6 84.9 84.6 83.6 85.4  PLT 321 334 323 337 303   Basic Metabolic Panel: Recent Labs  Lab 08/09/19 0333  08/10/19 0626  08/12/19 1020  08/13/19 0932  08/14/19 0230 08/14/19 0944 08/14/19 1243 08/14/19 1805 08/14/19 2026 08/15/19 0025  NA 120*   < > 118*   < > 125*   < > 118*   < > 121* 121* 124* 126* 125* 126*  K 4.9  --  5.0  --  4.5  --  4.6  --  4.1  --   --   --   --   --   CL 88*  --  84*  --  88*  --  86*  --  86*  --   --   --   --   --   CO2 23  --  22  --  22  --  23  --  23  --   --   --   --   --   GLUCOSE 96  --  109*  --  108*  --  98  --  95  --   --   --   --   --    BUN 14  --  16  --  16  --  17  --  22*  --   --   --   --   --   CREATININE 0.68  --  0.69  --  1.05  --  1.01  --  1.13  --   --   --   --   --   CALCIUM 9.1  --  9.6  --  9.6  --  9.1  --  9.3  --   --   --   --   --    < > = values in this interval not displayed.   GFR: Estimated Creatinine Clearance: 67.6 mL/min (by C-G formula based on SCr of 1.13 mg/dL). Liver Function Tests: Recent Labs  Lab 08/10/19 0626 08/12/19 1020 08/13/19 0932  AST 26 25 29   ALT 17 18 18   ALKPHOS 51 45 48  BILITOT 0.3 0.3 0.4  PROT 8.9* 8.7* 8.4*  ALBUMIN 3.1* 3.1* 2.9*   No results for input(s): LIPASE, AMYLASE in the last 168 hours. No results for input(s): AMMONIA in the last 168 hours. Coagulation Profile: No results for input(s): INR, PROTIME in the last 168 hours. Cardiac Enzymes: No results for input(s): CKTOTAL, CKMB, CKMBINDEX, TROPONINI in the last 168 hours. BNP (last 3 results) No results for input(s): PROBNP in the last 8760 hours. HbA1C: No results for input(s): HGBA1C in the last 72 hours. CBG: Recent Labs  Lab 08/14/19 1702 08/14/19 2002 08/14/19 2330 08/15/19 0327 08/15/19 0855  GLUCAP 98 140* 123* 119* 152*   Lipid Profile: No results for input(s): CHOL, HDL, LDLCALC, TRIG, CHOLHDL, LDLDIRECT in the last 72 hours. Thyroid Function Tests: No results for input(s): TSH, T4TOTAL, FREET4, T3FREE, THYROIDAB in the last 72 hours. Anemia Panel: No results for input(s): VITAMINB12, FOLATE, FERRITIN, TIBC, IRON, RETICCTPCT in the last 72 hours. Sepsis Labs: No results for input(s): PROCALCITON, LATICACIDVEN in the last 168 hours.  Recent Results (from the past 240 hour(s))  MRSA PCR Screening     Status: None   Collection Time: 08/10/19  1:35 PM   Specimen: Nasopharyngeal  Result Value Ref Range Status   MRSA by PCR NEGATIVE NEGATIVE Final    Comment:        The GeneXpert MRSA Assay (FDA approved for NASAL specimens only), is one component of a comprehensive MRSA  colonization surveillance program. It is not intended to diagnose MRSA infection nor to guide or monitor treatment for MRSA infections. Performed at [facility], [address] [place], [address] [place]       Radiology Studies: Dg Abd 1 View  Result Date: 08/14/2019 CLINICAL DATA:  38 year old male status post feeding tube placement. EXAM: ABDOMEN - 1 VIEW COMPARISON:  Radiograph dated 08/05/2019. FINDINGS: Feeding tube with tip in the distal second portion of the duodenum. Contrast injected through the tube opacifies the second and third portions of the duodenum. IMPRESSION: Feeding tube with tip in the second portion of the duodenum. Electronically Signed   By: 31 M.D.   On: 08/14/2019 09:27      Scheduled Meds:  Chlorhexidine Gluconate Cloth  6 each Topical Daily   demeclocycline  150 mg Per Tube Q12H   enoxaparin (LOVENOX) injection  40 mg Subcutaneous Q24H   famotidine  20 mg Per Tube Daily   feeding supplement (OSMOLITE 1.5 CAL)  1,000 mL Per Tube Q24H   feeding supplement (PRO-STAT SUGAR FREE 64)  30 mL Per Tube TID   fluconazole  200 mg Per Tube Daily   furosemide  80 mg Intravenous BID   magic mouthwash w/lidocaine  10 mL Oral TID AC & HS   mouth rinse  15 mL Mouth Rinse BID   multivitamin with minerals  1 tablet Per Tube Daily   sodium chloride  1 g Per Tube TID WC   Continuous Infusions:  chlorproMAZINE (THORAZINE) IV 12.5 mg (08/09/19 1135)   sulfamethoxazole-trimethoprim 344.7 mL/hr at 08/15/19 0700     LOS: 17 days      Time spent: 25 minutes   Noralee StainJennifer Aireona Torelli, DO [facility] 08/15/2019, 9:41 AM   Available via Epic secure chat 7am-7pm After these hours, please refer to coverage provider listed on [email or web address]

## 2019-08-16 LAB — BASIC METABOLIC PANEL
Anion gap: 11 (ref 5–15)
BUN: 40 mg/dL — ABNORMAL HIGH (ref 6–20)
CO2: 27 mmol/L (ref 22–32)
Calcium: 9.4 mg/dL (ref 8.9–10.3)
Chloride: 91 mmol/L — ABNORMAL LOW (ref 98–111)
Creatinine, Ser: 1.18 mg/dL (ref 0.61–1.24)
GFR calc Af Amer: >60 mL/min (ref >60)
GFR calc non Af Amer: >60 mL/min (ref >60)
Glucose, Bld: 151 mg/dL — ABNORMAL HIGH (ref 70–99)
Potassium: 5 mmol/L (ref 3.5–5.1)
Sodium: 129 mmol/L — ABNORMAL LOW (ref 135–145)

## 2019-08-16 LAB — GLUCOSE, CAPILLARY
Glucose-Capillary: 152 mg/dL — ABNORMAL HIGH (ref 70–99)
Glucose-Capillary: 138 mg/dL — ABNORMAL HIGH (ref 70–99)
Glucose-Capillary: 162 mg/dL — ABNORMAL HIGH (ref 70–99)
Glucose-Capillary: 155 mg/dL — ABNORMAL HIGH (ref 70–99)
Glucose-Capillary: 88 mg/dL (ref 70–99)

## 2019-08-16 LAB — CBC
HCT: 43 % (ref 39.0–52.0)
Hemoglobin: 14.9 g/dL (ref 13.0–17.0)
MCH: 29.9 pg (ref 26.0–34.0)
MCHC: 34.7 g/dL (ref 30.0–36.0)
MCV: 86.2 fL (ref 80.0–100.0)
Platelets: 339 10*3/uL (ref 150–400)
RBC: 4.99 MIL/uL (ref 4.22–5.81)
RDW: 13.6 % (ref 11.5–15.5)
WBC: 2.2 10*3/uL — ABNORMAL LOW (ref 4.0–10.5)
nRBC: 0 % (ref 0.0–0.2)

## 2019-08-16 LAB — SODIUM
Sodium: 128 mmol/L — ABNORMAL LOW (ref 135–145)
Sodium: 130 mmol/L — ABNORMAL LOW (ref 135–145)
Sodium: 130 mmol/L — ABNORMAL LOW (ref 135–145)
Sodium: 131 mmol/L — ABNORMAL LOW (ref 135–145)

## 2019-08-16 NOTE — Progress Notes (Signed)
PROGRESS NOTE    Arthur [name]  ZOX:[id number] DOB: Feb 17, 1981 DOA: 07/28/2019 PCP: Randall Hiss, MD     Brief Narrative:  TION TSE is a 38 year old male with past medical history significant for HIV, substance abuse, depression and GERD who presented to the emergency department on 11/1 with chief complaint of dizziness, weakness and intermittent headache. He has not taken any of his HIV medications for about 1 year due to affordability issue. MRI brain done in ED showed widespread areas of abnormal low level restricted diffusion and postcontrast enhancement throughout both cerebral hemispheres, predominantly infratentorial, with the dominant abnormality in the left anterior frontal white matter consistent with an opportunistic infection, such as toxoplasmosis, tuberculosis or fungal cerebritis/meningitis, or parasitic infection. Patient was admitted to hospital service and ID was consulted. He then revealed that he has lost his vision in his right eye. ID started him on penicillin and ganciclovir for possible CMV retinitis versus synovitis retinitis and ophthalmology consulted.  11/3: status post lumbar puncture by neurology 11/15: transferred to ICU for tx of severe hyponatremia/SIADH for tx with hypertonic saline as his mental status was worse, and respiratory status was declining 11/16: transferred to SDU  New events last 24 hours / Subjective: Not very interactive today. Alert and appearing calm and comfortable.    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 RIPEtx stopped -Appreciate ID. Completed PCN for 14 days. Additional IM bicillin on 11/25  -Continue Bactrim   Acute toxic metabolic encephalopathy, multifactorial including CNS infection and SIADH -Currently stable  Right eye blindness, optic retinitis/neurititis -Mainly concerned between syphilis and CMV-butCMV PCR negative in the CNS -Appreciate ophthalmology  -Received steroid injection 08/02/2019 -Repeat MRIbrain and orbits show improvement of brain lesions and optic neurititis, retinitis  Severehyponatremia -Likely 2/2severeSIADH -Transferred to ICU for 3% saline and IV lasix -Nephrology consulted; signed off 11/20  -Monitor sodium level closely, 129 this morning -Continue IV Lasix, salt tab, Declomycin  Leukopenia/immunocompromised in the setting of untreated HIV -Fluconazole for thrush  Untreated HIV -ID following  Dysphagia -Seen by SLP -Tube feeding managed by dietitian   Severe protein calorie malnutrition -Tube feeding managed by dietitian   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 Seen by palliative care. Currently recommending continuing aggressive measures   DVT prophylaxis: Lovenox Code Status: Full Family Communication: None at bedside Disposition Plan: Continue inpatient treatment. SNF when stable. Continue SLP eval, hopefully can advance PO and discontinue cortrak soon      Antimicrobials:  Anti-infectives (From admission, onward)   Start     Dose/Rate Route Frequency Ordered Stop   08/16/19 2000  Pyrimethamine-Leucovorin 50-25 MG CAPS 50 mg  Status:  Discontinued     50 mg Oral Daily 08/15/19 1337 08/15/19 1357   08/16/19 2000  Pyrimethamine-Leucovorin 50-25 MG CAPS 50 mg     50 mg Oral Daily 08/15/19 1357     08/15/19 2000  Pyrimethamine-Leucovorin 50-25 MG CAPS 200 mg  Status:  Discontinued  200 mg Oral  Once 08/15/19 1337 08/15/19 1357   08/15/19 2000  Pyrimethamine-Leucovorin 50-25 MG CAPS 200 mg     200 mg Oral  Once 08/15/19 1357 08/15/19 2114   08/15/19 1800  sulfaDIAZINE tablet 1,000 mg     1,000 mg Oral Every 6 hours 08/15/19 1337     08/14/19 1000  demeclocycline (DECLOMYCIN) tablet 150 mg  Status:  Discontinued     150 mg Oral Every 12 hours 08/14/19 0848 08/14/19 0907   08/14/19 1000  demeclocycline (DECLOMYCIN) tablet 150 mg     150 mg Per Tube Every 12 hours 08/14/19 0907     08/14/19 1000  fluconazole (DIFLUCAN) tablet 200 mg     200 mg Per Tube Daily 08/14/19 0907 08/19/19 0959   08/13/19 1015  fluconazole (DIFLUCAN) tablet 200 mg  Status:  Discontinued     200 mg Oral Daily 08/13/19 1009 08/14/19 0907   08/13/19 1000  penicillin g benzathine (BICILLIN LA) 1200000 UNIT/2ML injection 2.4 Million Units     2.4 Million Units Intramuscular  Once 08/08/19 1019 08/13/19 1222   08/12/19 1400  sulfamethoxazole-trimethoprim (BACTRIM) 272.48 mg in dextrose 5 % 500 mL IVPB  Status:  Discontinued     10 mg/kg/day  54.5 kg 344.7 mL/hr over 90 Minutes Intravenous Every 12 hours 08/12/19 0854 08/15/19 1337   08/11/19 1400  sulfamethoxazole-trimethoprim (BACTRIM) 400-80 MG per tablet 3 tablet  Status:  Discontinued     3 tablet Oral Every 8 hours 08/11/19 1349 08/12/19 0854   08/10/19 1000  abacavir-dolutegravir-lamiVUDine (TRIUMEQ) 600-50-300 MG per tablet 1 tablet  Status:  Discontinued     1 tablet Oral Daily 08/09/19 1514 08/12/19 0848   08/10/19 1000  sulfamethoxazole-trimethoprim (BACTRIM) 160 mg in dextrose 5 % 250 mL IVPB  Status:  Discontinued     160 mg 260 mL/hr over 60 Minutes Intravenous Daily 08/09/19 1514 08/11/19 1324   08/09/19 1730  metroNIDAZOLE (FLAGYL) IVPB 500 mg     500 mg 100 mL/hr over 60 Minutes Intravenous Every 8 hours 08/09/19 1636 08/13/19 1848   08/08/19 1000  rifampin (RIFADIN) 60 mg/mL oral suspension 600 mg  Status:  Discontinued     600 mg  Per Tube Daily 08/07/19 1106 08/07/19 1319   08/08/19 1000  bictegravir-emtricitabine-tenofovir AF (BIKTARVY) 50-200-25 MG per tablet 1 tablet  Status:  Discontinued     1 tablet Oral Daily 08/07/19 1419 08/09/19 1514   08/07/19 1330  bictegravir-emtricitabine-tenofovir AF (BIKTARVY) 50-200-25 MG per tablet 1 tablet  Status:  Discontinued     1 tablet Oral Daily 08/07/19 1319 08/07/19 1419   08/07/19 1000  ethambutol (MYAMBUTOL) tablet 1,200 mg  Status:  Discontinued     1,200 mg Per Tube Daily 08/06/19 1423 08/07/19 1319   08/07/19 1000  isoniazid (NYDRAZID) tablet 300 mg  Status:  Discontinued     300 mg Per Tube Daily 08/06/19 1423 08/07/19 1319   08/07/19 1000  rifampin (RIFADIN) 60 mg/mL oral suspension 600 mg  Status:  Discontinued     600 mg Oral Daily 08/06/19 1423 08/07/19 1106   08/07/19 1000  sulfamethoxazole-trimethoprim (BACTRIM DS) 800-160 MG per tablet 1 tablet  Status:  Discontinued     1 tablet Per Tube Daily 08/06/19 1423 08/09/19 1514   08/07/19 1000  pyrazinamide tablet 1,500 mg  Status:  Discontinued     1,500 mg Per Tube Daily 08/06/19 1423 08/07/19 1319   08/03/19 1600  fluconazole (DIFLUCAN) IVPB 200 mg  Status:  Discontinued     200 mg 100 mL/hr over 60 Minutes Intravenous Every 24 hours 08/03/19 1548 08/13/19 1009   07/30/19 1700  rifampin (RIFADIN) capsule 600 mg  Status:  Discontinued     600 mg Oral Daily 07/30/19 1604 08/06/19 1423   07/30/19 1700  isoniazid (NYDRAZID) tablet 300 mg  Status:  Discontinued     300 mg Oral Daily 07/30/19 1604 08/06/19 1423   07/30/19 1700  pyrazinamide tablet 1,500 mg  Status:  Discontinued     1,500 mg Oral Daily 07/30/19 1604 08/06/19 1423   07/30/19 1700  ethambutol (MYAMBUTOL) tablet 1,200 mg  Status:  Discontinued     1,200 mg Oral Daily 07/30/19 1604 08/06/19 1423   07/29/19 1700  sulfamethoxazole-trimethoprim (BACTRIM DS) 800-160 MG per tablet 1 tablet  Status:  Discontinued     1 tablet Oral Daily 07/29/19 1601 08/06/19  1423   07/29/19 1700  ganciclovir (CYTOVENE) 285 mg in sodium chloride 0.9 % 100 mL IVPB  Status:  Discontinued     5 mg/kg  57 kg 100 mL/hr over 60 Minutes Intravenous Every 12 hours 07/29/19 1603 08/07/19 1526   07/29/19 1630  penicillin G potassium 12 Million Units in dextrose 5 % 250 mL IVPB  Status:  Discontinued     12 Million Units 250 mL/hr over 60 Minutes Intravenous Every 12 hours 07/29/19 1552 07/29/19 1559   07/29/19 1630  penicillin G potassium 12 Million Units in dextrose 5 % 500 mL continuous infusion     12 Million Units 41.7 mL/hr over 12 Hours Intravenous Every 12 hours 07/29/19 1559 08/12/19 2359       Objective: Vitals:   08/15/19 2335 08/16/19 0428 08/16/19 0515 08/16/19 0726  BP:    102/67  Pulse:      Resp:    (!) 22  Temp: 98.2 F (36.8 C) (!) 97.5 F (36.4 C)  97.6 F (36.4 C)  TempSrc: Oral Oral  Oral  SpO2:      Weight:   51.5 kg   Height:        Intake/Output Summary (Last 24 hours) at 08/16/2019 1108 Last data filed at 08/16/2019 1000 Gross per 24 hour  Intake 700 ml  Output 1175 ml  Net -475 ml   Filed Weights   08/14/19 0500 08/15/19 0304 08/16/19 0515  Weight: 54.7 kg 52.9 kg 51.5 kg    Examination: General exam: Appears calm and comfortable  Respiratory system: Clear to auscultation. Respiratory effort normal. Cardiovascular system: S1 & S2 heard, RRR. No pedal edema. Gastrointestinal system: Abdomen is nondistended, soft and nontender. Normal bowel sounds heard. Central nervous system: Alert  Extremities: Symmetric in appearance bilaterally  Skin: No rashes, lesions or ulcers on exposed skin    Data Reviewed: I have personally reviewed following labs and imaging studies  CBC: Recent Labs  Lab 08/12/19 1020 08/13/19 0932 08/14/19 0230 08/15/19 0754 08/16/19 0441  WBC 2.6* 2.1* 2.2* 2.1* 2.2*  NEUTROABS 1.5* 1.1*  --   --   --   HGB 14.5 13.9 14.8 15.5 14.9  HCT 40.9 38.9* 41.2 43.9 43.0  MCV 84.9 84.6 83.6 85.4 86.2    PLT 334 323 337 303 638   Basic Metabolic Panel: Recent Labs  Lab 08/12/19 1020  08/13/19 0932  08/14/19 0230  08/15/19 0754  08/15/19 1627 08/15/19 2015 08/16/19 0149 08/16/19 0441 08/16/19 0932  NA 125*   < > 118*   < > 121*   < > 127*   < >  125* 124* 128* 129* 130*  K 4.5  --  4.6  --  4.1  --  5.2*  --   --   --   --  5.0  --   CL 88*  --  86*  --  86*  --  92*  --   --   --   --  91*  --   CO2 22  --  23  --  23  --  25  --   --   --   --  27  --   GLUCOSE 108*  --  98  --  95  --  131*  --   --   --   --  151*  --   BUN 16  --  17  --  22*  --  29*  --   --   --   --  40*  --   CREATININE 1.05  --  1.01  --  1.13  --  1.15  --   --   --   --  1.18  --   CALCIUM 9.6  --  9.1  --  9.3  --  9.0  --   --   --   --  9.4  --    < > = values in this interval not displayed.   GFR: Estimated Creatinine Clearance: 63 mL/min (by C-G formula based on SCr of 1.18 mg/dL). Liver Function Tests: Recent Labs  Lab 08/10/19 0626 08/12/19 1020 08/13/19 0932  AST 26 25 29   ALT 17 18 18   ALKPHOS 51 45 48  BILITOT 0.3 0.3 0.4  PROT 8.9* 8.7* 8.4*  ALBUMIN 3.1* 3.1* 2.9*   No results for input(s): LIPASE, AMYLASE in the last 168 hours. No results for input(s): AMMONIA in the last 168 hours. Coagulation Profile: No results for input(s): INR, PROTIME in the last 168 hours. Cardiac Enzymes: No results for input(s): CKTOTAL, CKMB, CKMBINDEX, TROPONINI in the last 168 hours. BNP (last 3 results) No results for input(s): PROBNP in the last 8760 hours. HbA1C: No results for input(s): HGBA1C in the last 72 hours. CBG: Recent Labs  Lab 08/15/19 1638 08/15/19 2015 08/15/19 2333 08/16/19 0421 08/16/19 0810  GLUCAP 159* 160* 119* 152* 138*   Lipid Profile: No results for input(s): CHOL, HDL, LDLCALC, TRIG, CHOLHDL, LDLDIRECT in the last 72 hours. Thyroid Function Tests: No results for input(s): TSH, T4TOTAL, FREET4, T3FREE, THYROIDAB in the last 72 hours. Anemia Panel: No results  for input(s): VITAMINB12, FOLATE, FERRITIN, TIBC, IRON, RETICCTPCT in the last 72 hours. Sepsis Labs: No results for input(s): PROCALCITON, LATICACIDVEN in the last 168 hours.  Recent Results (from the past 240 hour(s))  MRSA PCR Screening     Status: None   Collection Time: 08/10/19  1:35 PM   Specimen: Nasopharyngeal  Result Value Ref Range Status   MRSA by PCR NEGATIVE NEGATIVE Final    Comment:        The GeneXpert MRSA Assay (FDA approved for NASAL specimens only), is one component of a comprehensive MRSA colonization surveillance program. It is not intended to diagnose MRSA infection nor to guide or monitor treatment for MRSA infections. Performed at [facility], [address], [place], [address] [place]       Radiology Studies: No results found.    Scheduled Meds:  Chlorhexidine Gluconate Cloth  6 each Topical Daily   demeclocycline  150 mg Per Tube Q12H   enoxaparin (  LOVENOX) injection  40 mg Subcutaneous Q24H   famotidine  20 mg Per Tube Daily   feeding supplement (OSMOLITE 1.5 CAL)  1,000 mL Per Tube Q24H   feeding supplement (PRO-STAT SUGAR FREE 64)  30 mL Per Tube TID   fluconazole  200 mg Per Tube Daily   furosemide  80 mg Intravenous BID   insulin aspart  0-9 Units Subcutaneous Q4H   magic mouthwash w/lidocaine  10 mL Oral TID AC & HS   mouth rinse  15 mL Mouth Rinse BID   multivitamin with minerals  1 tablet Per Tube Daily   Pyrimethamine-Leucovorin  50 mg Oral Daily   sodium chloride  1 g Per Tube TID WC   sulfaDIAZINE  1,000 mg Oral Q6H   Continuous Infusions:  chlorproMAZINE (THORAZINE) IV 12.5 mg (08/09/19 1135)     LOS: 18 days      Time spent: 25 minutes   Noralee StainJennifer Kanitra Purifoy, DO [facility] 08/16/2019, 11:08 AM   Available via Epic secure chat 7am-7pm After these hours, please refer to coverage provider listed on [email or web address]

## 2019-08-16 NOTE — Progress Notes (Addendum)
[facility]    Date of Admission:  07/28/2019   Total days of antibiotics 18           ID: Arthur [name] is a 38 y.o. male with  Advanced hiv disease, found to have neurosyphilis-cns toxo Principal Problem:   Toxoplasmosis Active Problems:   Neurosyphilis   Nausea and vomiting   Acquired immunodeficiency syndrome (HCC)   Bradycardia   Hyponatremia   Vision loss   Abnormal brain MRI   Palliative care encounter   Thrush of mouth and esophagus (HCC)   Goals of care, counseling/discussion    Subjective: Afebrile, but less interactive/verbal with mother at bedside.  Medications:  . Chlorhexidine Gluconate Cloth  6 each Topical Daily  . demeclocycline  150 mg Per Tube Q12H  . enoxaparin (LOVENOX) injection  40 mg Subcutaneous Q24H  . famotidine  20 mg Per Tube Daily  . feeding supplement (OSMOLITE 1.5 CAL)  1,000 mL Per Tube Q24H  . feeding supplement (PRO-STAT SUGAR FREE 64)  30 mL Per Tube TID  . fluconazole  200 mg Per Tube Daily  . furosemide  80 mg Intravenous BID  . insulin aspart  0-9 Units Subcutaneous Q4H  . magic mouthwash w/lidocaine  10 mL Oral TID AC & HS  . mouth rinse  15 mL Mouth Rinse BID  . multivitamin with minerals  1 tablet Per Tube Daily  . Pyrimethamine-Leucovorin  50 mg Oral Daily  . sodium chloride  1 g Per Tube TID WC  . sulfaDIAZINE  1,000 mg Oral Q6H    Objective: Vital signs in last 24 hours: Temp:  [97.5 F (36.4 C)-98.2 F (36.8 C)] 97.7 F (36.5 C) (11/20 1210) Pulse Rate:  [84-97] 97 (11/19 1902) Resp:  [17-22] 19 (11/20 1210) BP: (92-107)/(67-75) 107/75 (11/20 1210) SpO2:  [95 %] 95 % (11/19 1902) Weight:  [51.5 kg] 51.5 kg (11/20 0515) Physical Exam  Constitutional: He is oriented to person, only. He appears cachetic and severely mal-nourished. No distress.  HENT: disconjugate gaze Mouth/Throat: Oropharynx is clear and moist. No oropharyngeal exudate.  Cardiovascular: Normal rate, regular rhythm  and normal heart sounds. Exam reveals no gallop and no friction rub.  No murmur heard.  Pulmonary/Chest: Effort normal and breath sounds normal. No respiratory distress. He has no wheezes.  Abdominal: Soft. scaphoid. He exhibits no distension. There is no tenderness.  Buttock: no skin breakdown Lymphadenopathy:  He has no cervical adenopathy.  Neurological: He is alert and oriented to person, place, and time.  Skin: Skin is warm and dry. No rash noted. No erythema.  Ext: wasted extremities Psychiatric: flat   Lab Results Recent Labs    08/15/19 0754  08/16/19 0441 08/16/19 0932  WBC 2.1*  --  2.2*  --   HGB 15.5  --  14.9  --   HCT 43.9  --  43.0  --   NA 127*   < > 129* 130*  K 5.2*  --  5.0  --   CL 92*  --  91*  --   CO2 25  --  27  --   BUN 29*  --  40*  --   CREATININE 1.15  --  1.18  --    < > = values in this interval not displayed.    Microbiology: reviewed Studies/Results: No results found.   Assessment/Plan: CNS toxo = continue with current regimen of pyrimethamine and sulfaziadine  Decreased sensorium = recommend repeat NCHCT  Severe protein caloric malnutrition = continue with tube feed  hiv disease = treatment pending while getting toxo treatment  Thrush = continue on fluconazole  Hyponatremia =slowly improving. At 130 with hypertonic saline. 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

## 2019-08-16 NOTE — Progress Notes (Signed)
  Speech Language Pathology Treatment: Dysphagia  Patient Details Name: Arthur [name] MRN: [id number] DOB: Sep 11, 1981 Today's Date: 08/16/2019 Time: 1214-1229 SLP Time Calculation (min) (ACUTE ONLY): 15 min  Assessment / Plan / Recommendation Clinical Impression  Pt remains clinically similar in terms of his swallowing function, although now he is gesturing and making requests for something to drink. Although he can close his mouth at rest, he does not achieve labial seal around a spoon or straw despite cues. He allows the majority of liquid boluses to spill passively and anteriorly out of his mouth. SLP provided Max cues to initiate a swallow, which he did x2. Otherwise he would either start coughing before swallow could be elicited or SLP would orally suction the bolus out of his mouth. Pt continues to have significant difficulty with initiation of oral transit and pharyngeal swallow. He is not appropriate for a PO diet. Will continue to follow.   HPI HPI: Pt is a 38 y.o. male admitted with neurosyphillis, CNS toxoplasmosis. MRI 11/1 showed widespread areas concerning for infection, with most dominant abnormality in the L anterior frontal white matter. Hospital course was complicated by hyponatremia and respiratory decline, transferred to ICU on 11/14 but not requiring intubation. Multiple CXRs this admission without acute findings. PMH: substance abuse, HIV, GERD, depression, anxiety, anemia      SLP Plan  Continue with current plan of care       Recommendations  Diet recommendations: NPO Medication Administration: Via alternative means                Oral Care Recommendations: Oral care QID Follow up Recommendations: 24 hour supervision/assistance SLP Visit Diagnosis: Dysphagia, oropharyngeal phase (R13.12) Plan: Continue with current plan of care       GO                Venita Sheffield Emie Sommerfeld 08/16/2019, 12:33 PM  Pollyann Glen, M.A. [place] Acute Environmental education officer  [phone] Office [phone]

## 2019-08-16 NOTE — Progress Notes (Signed)
[facility] Progress Note    Assessment/ Plan:   1. 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  Initially on 3% saline, now off and on IV Lasix 80 IV BID, salt tablets added 11/17 1g TID but pulled out coretrack so only got one dose.  Na better with addition of demeclocycline 150 mg BID; continue Na checks q 4; TF restarted. Continue treatment as is for now with IV Lasix and demeclocycline.  Could transition to PO Lasix when Na normalizes.  Nothing further to add- will sign off.  Please call with questions.     2.   AMS/Encepahlopathy: numerous causes including #1--> Neurosyphilis and toxo being treated per ID 3. Neurosyphilis-- IV pen G and transitioned to IM Bicillin 4. 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 FTT: Tube Feeds 6. AIDS-- Off Triumeq per ID 7. Dispo: palliative care following- appreciate assistance  Subjective:    Na up to 129.  Minimally interactive today.    Objective:   BP 102/67 (BP Location: Left Arm)   Pulse 97   Temp 97.6 F (36.4 C) (Oral)   Resp (!) 22   Ht 5\' 11"  (1.803 m)   Wt 51.5 kg   SpO2 95%   BMI 15.84 kg/m   Intake/Output Summary (Last 24 hours) at 08/16/2019 0859 Last data filed at 08/16/2019 0515 Gross per 24 hour  Intake 700 ml  Output 1400 ml  Net -700 ml   Weight change: -1.4 kg  Physical Exam: GEN sleeping, minimally interactive. HEENT: significant temporal wasting, Coretrak in place NECK: no JVD CV: RRR no m/r/g PULM: normal WOB, R sided rhonchi, no coughing today EXT: No edema  Imaging: No results found.  Labs: BMET Recent Labs  Lab 08/10/19 0626  08/12/19 1020  08/13/19 0932  08/14/19 0230  08/15/19 0025 08/15/19 0754 08/15/19 1218 08/15/19 1627 08/15/19 2015 08/16/19 0149 08/16/19 0441  NA 118*   < > 125*   < > 118*   < > 121*   < > 126* 127* 125* 125* 124* 128* 129*  K 5.0   --  4.5  --  4.6  --  4.1  --   --  5.2*  --   --   --   --  5.0  CL 84*  --  88*  --  86*  --  86*  --   --  92*  --   --   --   --  91*  CO2 22  --  22  --  23  --  23  --   --  25  --   --   --   --  27  GLUCOSE 109*  --  108*  --  98  --  95  --   --  131*  --   --   --   --  151*  BUN 16  --  16  --  17  --  22*  --   --  29*  --   --   --   --  40*  CREATININE 0.69  --  1.05  --  1.01  --  1.13  --   --  1.15  --   --   --   --  1.18  CALCIUM 9.6  --  9.6  --  9.1  --  9.3  --   --  9.0  --   --   --   --  9.4   < > = values in this interval not displayed.   CBC Recent Labs  Lab 08/12/19 1020 08/13/19 0932 08/14/19 0230 08/15/19 0754 08/16/19 0441  WBC 2.6* 2.1* 2.2* 2.1* 2.2*  NEUTROABS 1.5* 1.1*  --   --   --   HGB 14.5 13.9 14.8 15.5 14.9  HCT 40.9 38.9* 41.2 43.9 43.0  MCV 84.9 84.6 83.6 85.4 86.2  PLT 334 323 337 303 339    Medications:    . Chlorhexidine Gluconate Cloth  6 each Topical Daily  . demeclocycline  150 mg Per Tube Q12H  . enoxaparin (LOVENOX) injection  40 mg Subcutaneous Q24H  . famotidine  20 mg Per Tube Daily  . feeding supplement (OSMOLITE 1.5 CAL)  1,000 mL Per Tube Q24H  . feeding supplement (PRO-STAT SUGAR FREE 64)  30 mL Per Tube TID  . fluconazole  200 mg Per Tube Daily  . furosemide  80 mg Intravenous BID  . insulin aspart  0-9 Units Subcutaneous Q4H  . magic mouthwash w/lidocaine  10 mL Oral TID AC & HS  . mouth rinse  15 mL Mouth Rinse BID  . multivitamin with minerals  1 tablet Per Tube Daily  . Pyrimethamine-Leucovorin  50 mg Oral Daily  . sodium chloride  1 g Per Tube TID WC  . sulfaDIAZINE  1,000 mg Oral Q6H      Madelon Lips, MD 08/16/2019, 8:59 AM

## 2019-08-16 NOTE — Plan of Care (Signed)

## 2019-08-16 NOTE — TOC Progression Note (Signed)
Transition of Care [facility]) - Progression Note    Patient Details  Name: Arthur [name] MRN: [id number] Date of Birth: Sep 06, 1981  Transition of Care [facility]) CM/SW [employer], [place] Phone Number: 08/16/2019, 3:59 PM  Clinical Narrative:     Patient still only alert to himself and place. CSW reached out to financial counseling to see if a medicaid application has been started on his behalf.   CSW will continue to follow and assist as needed.   Expected Discharge Plan: [place] Barriers to Discharge: Continued Medical Work up  Expected Discharge Plan and Services Expected Discharge Plan: [place] In-house Referral: NA   Post Acute Care Choice: [place] arrangements for the past 2 months: Single Family Home                           HH Arranged: PT, OT HH Agency: [facility] Date [place]: 07/30/19 Time [place]: 1442 Representative spoke with at [place]: [name]   Social Determinants of Health ([place]) Interventions    Readmission Risk Interventions No flowsheet data found.

## 2019-08-17 ENCOUNTER — Inpatient Hospital Stay (HOSPITAL_COMMUNITY): Payer: Medicaid Other

## 2019-08-17 LAB — BASIC METABOLIC PANEL
Anion gap: 13 (ref 5–15)
BUN: 53 mg/dL — ABNORMAL HIGH (ref 6–20)
CO2: 26 mmol/L (ref 22–32)
Calcium: 9.5 mg/dL (ref 8.9–10.3)
Chloride: 91 mmol/L — ABNORMAL LOW (ref 98–111)
Creatinine, Ser: 1.56 mg/dL — ABNORMAL HIGH (ref 0.61–1.24)
GFR calc Af Amer: >60 mL/min (ref >60)
GFR calc non Af Amer: 56 mL/min — ABNORMAL LOW (ref >60)
Glucose, Bld: 148 mg/dL — ABNORMAL HIGH (ref 70–99)
Potassium: 5 mmol/L (ref 3.5–5.1)
Sodium: 130 mmol/L — ABNORMAL LOW (ref 135–145)

## 2019-08-17 LAB — GLUCOSE, CAPILLARY
Glucose-Capillary: 133 mg/dL — ABNORMAL HIGH (ref 70–99)
Glucose-Capillary: 137 mg/dL — ABNORMAL HIGH (ref 70–99)
Glucose-Capillary: 139 mg/dL — ABNORMAL HIGH (ref 70–99)
Glucose-Capillary: 145 mg/dL — ABNORMAL HIGH (ref 70–99)
Glucose-Capillary: 146 mg/dL — ABNORMAL HIGH (ref 70–99)
Glucose-Capillary: 151 mg/dL — ABNORMAL HIGH (ref 70–99)
Glucose-Capillary: 162 mg/dL — ABNORMAL HIGH (ref 70–99)

## 2019-08-17 LAB — SODIUM
Sodium: 132 mmol/L — ABNORMAL LOW (ref 135–145)
Sodium: 131 mmol/L — ABNORMAL LOW (ref 135–145)
Sodium: 128 mmol/L — ABNORMAL LOW (ref 135–145)

## 2019-08-17 IMAGING — CT CT HEAD W/O CM
4 of 6 series · 17 of 47 positions shown, 18 images · non-contrast
Comparison: Head CT 20 days ago.  Interval brain MRI

CLINICAL DATA: Weakness.  Encephalopathy.

EXAM:
CT HEAD WITHOUT CONTRAST
TECHNIQUE: Contiguous axial images were obtained from the base of the skull
through the vertex without intravenous contrast.

[Series 3: head bone · axial · 0.45mm/px · z∈[-9,+99]mm · 7 of 79 slices shown]
[im 7/79  bone]
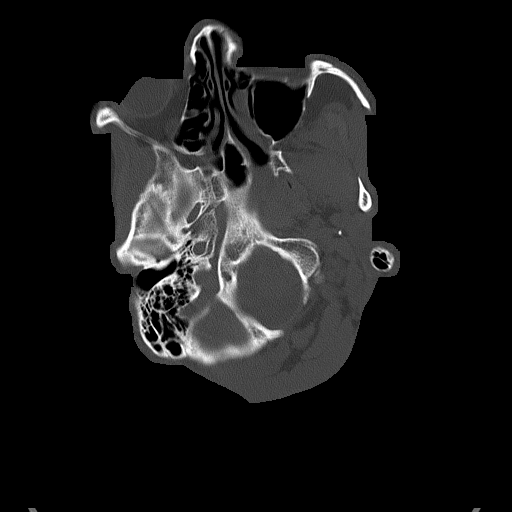
[im 19/79  bone]
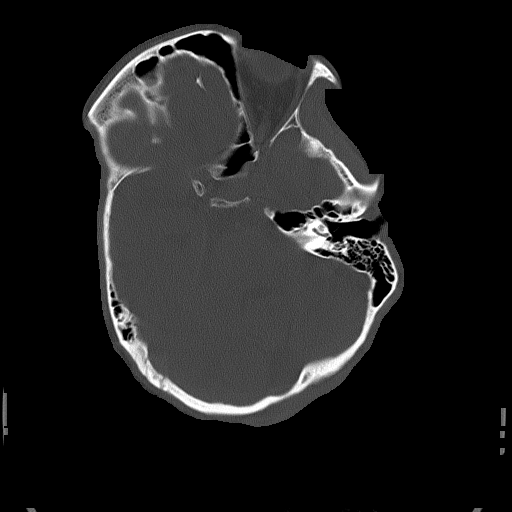
[im 25/79  bone]
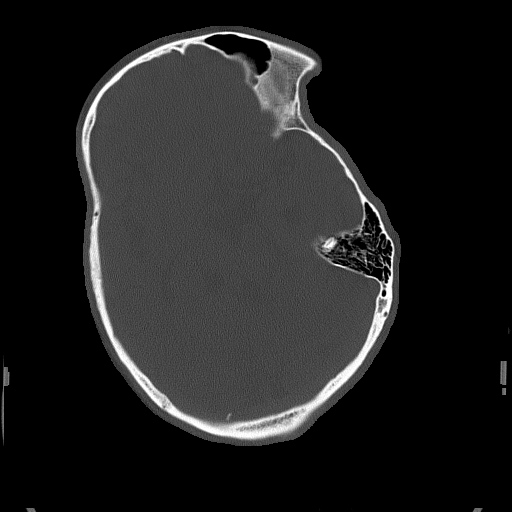
[im 37/79  bone]
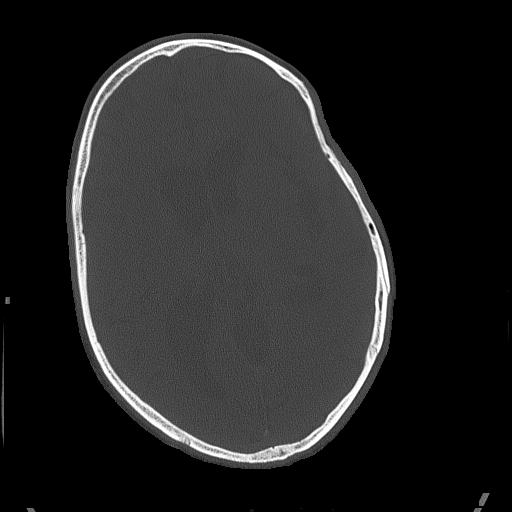
[im 43/79  bone]
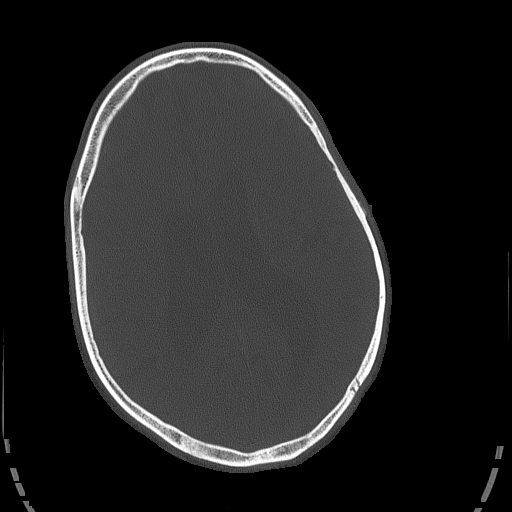
[im 55/79  bone]
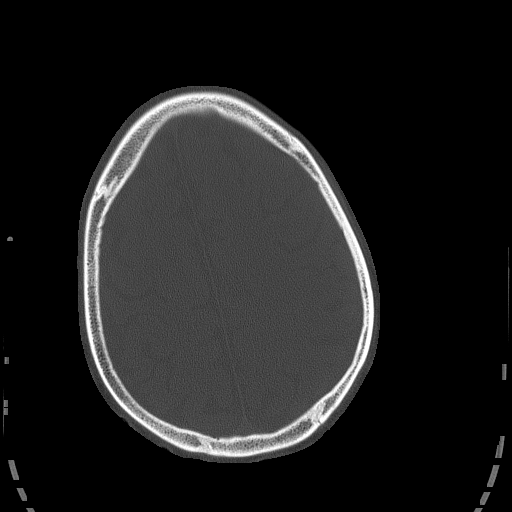
[im 61/79  bone]
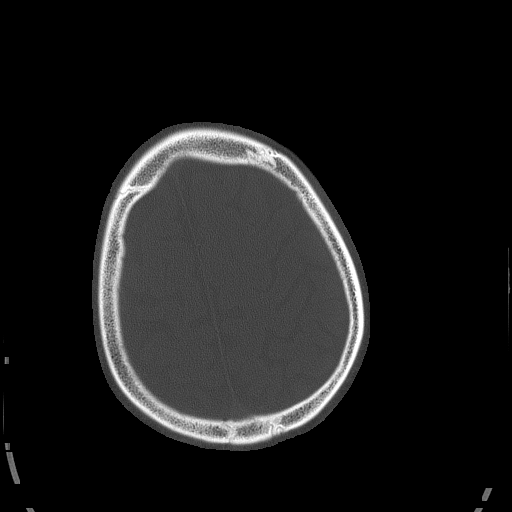

[Series 4: head without · axial · non-contrast · 0.45mm/px · z∈[+9,+99]mm · 4 of 32 slices shown, 5 images]
[im 7/32  brain]
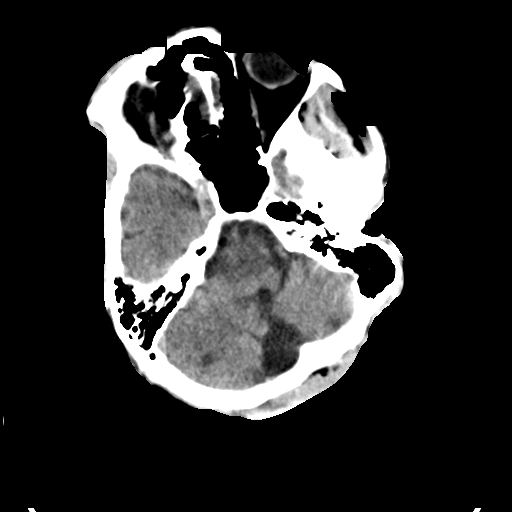
[im 7/32  bone]
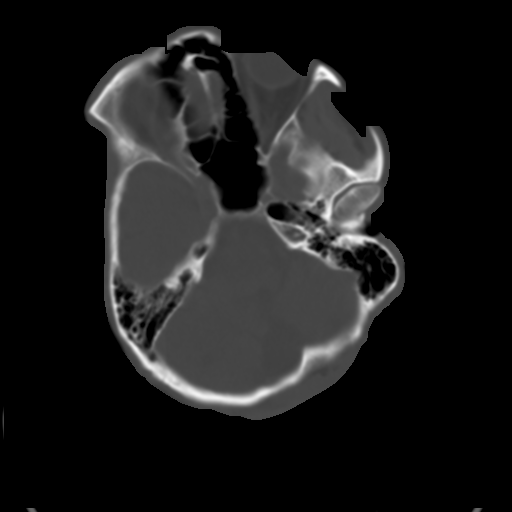
[im 13/32  brain]
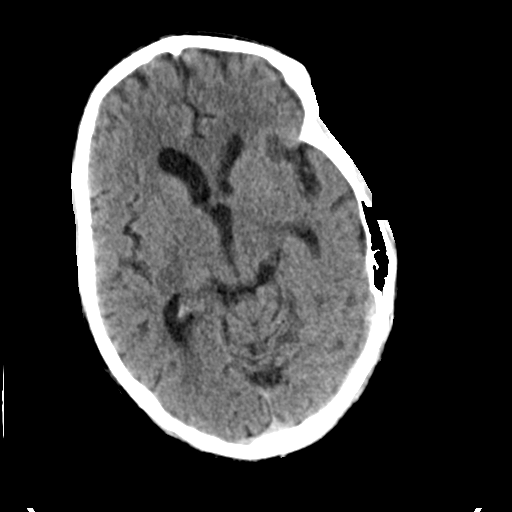
[im 19/32  brain]
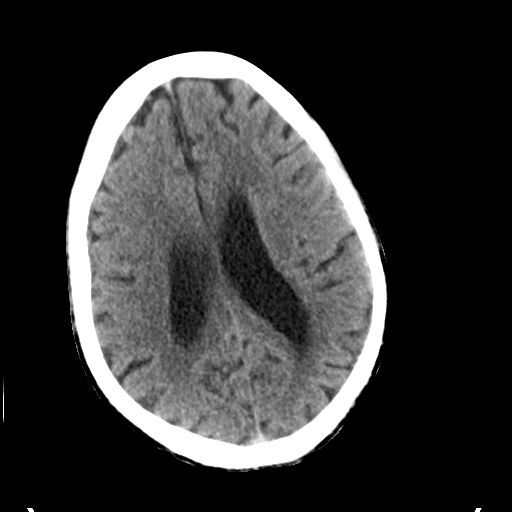
[im 25/32  brain]
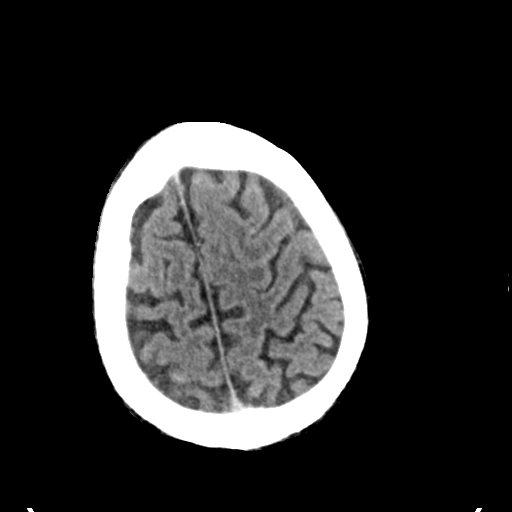

[Series 5: head without cor · coronal · non-contrast · 0.33mm/px · 3 of 68 slices shown]
[im 23/68  brain]
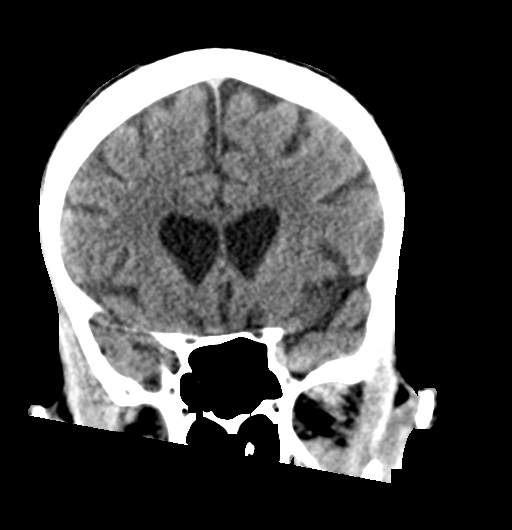
[im 30/68  brain]
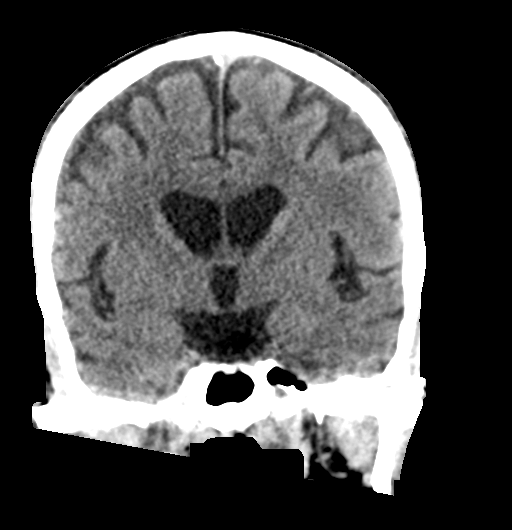
[im 38/68  brain]
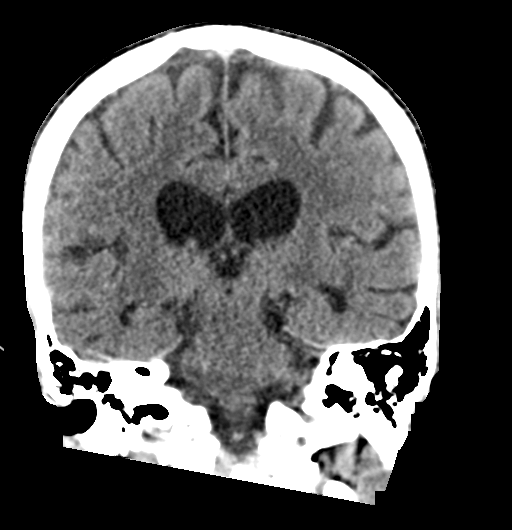

[Series 6: head without sag · sagittal · non-contrast · 0.32mm/px · 3 of 51 slices shown]
[im 17/51  brain]
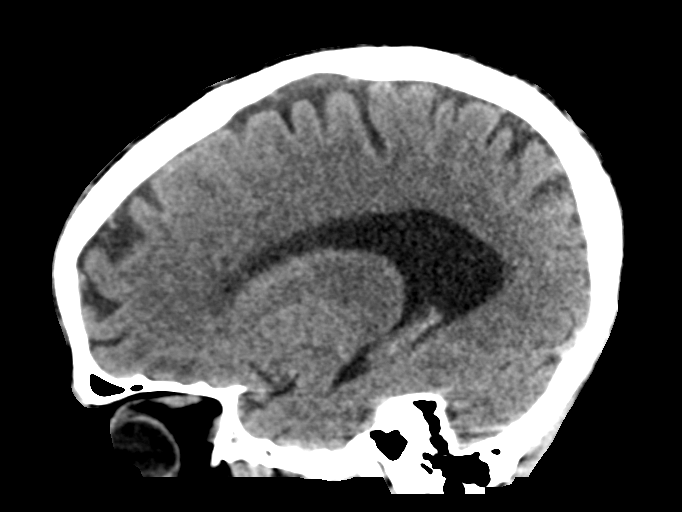
[im 26/51  brain]
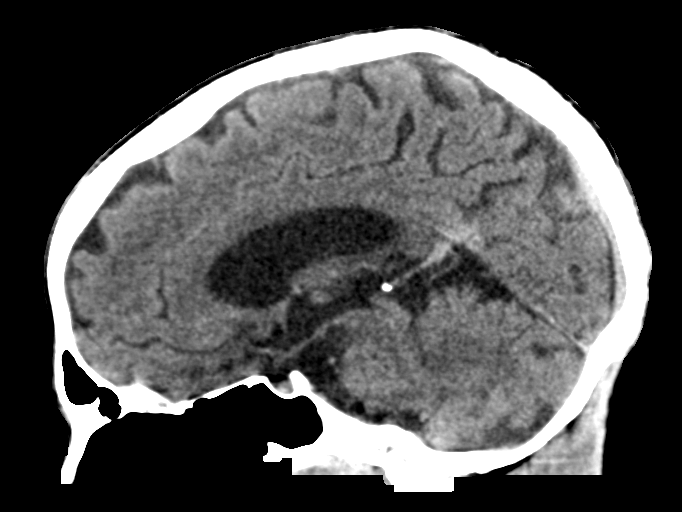
[im 34/51  brain]
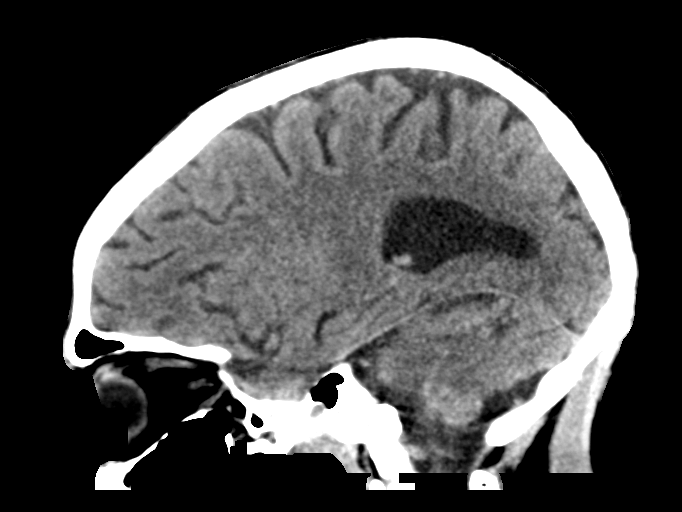

[17 of 47 positions shown; findings below may reference images not displayed]

FINDINGS: Brain: High-density areas within the bilateral cerebellum have
resolved. In place are newly seen low-density areas with patchy
distribution on both sides. The extent is similar to signal
abnormality in the bilateral cerebellum on interval brain MRI. At
the site of a large anterior subcortical left frontal lesion there
is new high density which could be petechial hemorrhage or
mineralization. Suspect there is decreased mass effect at this
level. Age advanced brain atrophy in the setting of HIV. No
hematoma, hydrocephalus, or collection.

Vascular: Negative

Skull: Negative

Sinuses/Orbits: Negative
IMPRESSION: 1. No new process.
2. Evolving lesions of the cerebellum and left frontal lobe, extent
of disease is underestimated relative to prior MRI. A Left frontal
lesion is now mildly high density which could be from mineralization
or petechial hemorrhage.
3. Atrophy.

## 2019-08-17 NOTE — Progress Notes (Signed)
PROGRESS NOTE    Arthur [name]  QIO:[id number] DOB: Dec 16, 1980 DOA: 07/28/2019 PCP: Randall Hiss, MD     Brief Narrative:  Arthur [name] is a 38 year old male with past medical history significant for HIV, substance abuse, depression and GERD who presented to the emergency department on 11/1 with chief complaint of dizziness, weakness and intermittent headache. He has not taken any of his HIV medications for about 1 year due to affordability issue. MRI brain done in ED showed widespread areas of abnormal low level restricted diffusion and postcontrast enhancement throughout both cerebral hemispheres, predominantly infratentorial, with the dominant abnormality in the left anterior frontal white matter consistent with an opportunistic infection, such as toxoplasmosis, tuberculosis or fungal cerebritis/meningitis, or parasitic infection. Patient was admitted to hospital service and ID was consulted. He then revealed that he has lost his vision in his right eye. ID started him on penicillin and ganciclovir for possible CMV retinitis versus synovitis retinitis and ophthalmology consulted.  11/3: status post lumbar puncture by neurology 11/15: transferred to ICU for tx of severe hyponatremia/SIADH for tx with hypertonic saline as his mental status was worse, and respiratory status was declining 11/16: transferred to SDU  New events last 24 hours / Subjective: Alert but not interactive, does not answer questions. Per RN, he did nod his head earlier today during her assessment asking if he was doing ok.    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 RIPEtx stopped -Appreciate ID. Completed PCN for 14 days. Additional IM bicillin on 11/25  -Continue pyrimethamin-leucovorin, sulfadiazine   Acute toxic metabolic encephalopathy, multifactorial including CNS infection and SIADH -Repeat CT head today   Right eye blindness, optic retinitis/neurititis -Mainly concerned between syphilis and CMV-butCMV PCR negative in the CNS -Appreciate ophthalmology  -Received steroid injection 08/02/2019 -Repeat MRIbrain and orbits show improvement of brain lesions and optic neurititis, retinitis  Severehyponatremia -Likely 2/2severeSIADH -Transferred to ICU for 3% saline and IV lasix -Nephrology consulted; signed off 11/20  -Monitor sodium level closely, 132 this morning -Continue IV Lasix, salt tab, Declomycin -Can transition to PO lasix when Na normalizes   Leukopenia/immunocompromised in the setting of untreated HIV -Fluconazole for thrush  Untreated HIV -ID following, off tx for now   Dysphagia -Seen by SLP -Tube feeding managed by dietitian   Severe protein calorie malnutrition -Tube feeding managed by dietitian   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 Seen by palliative care. Currently recommending continuing aggressive measures   DVT prophylaxis: Lovenox Code Status: Full Family Communication: None at bedside Disposition Plan: Continue inpatient treatment. SNF when stable. Continue SLP eval, hopefully can advance PO and discontinue cortrak soon      Antimicrobials:  Anti-infectives (From admission, onward)   Start     Dose/Rate Route Frequency Ordered Stop   08/16/19 2000  Pyrimethamine-Leucovorin 50-25 MG CAPS 50 mg  Status:  Discontinued     50 mg Oral Daily 08/15/19 1337 08/15/19 1357    08/16/19 2000  Pyrimethamine-Leucovorin 50-25 MG  CAPS 50 mg     50 mg Oral Daily 08/15/19 1357     08/15/19 2000  Pyrimethamine-Leucovorin 50-25 MG CAPS 200 mg  Status:  Discontinued     200 mg Oral  Once 08/15/19 1337 08/15/19 1357   08/15/19 2000  Pyrimethamine-Leucovorin 50-25 MG CAPS 200 mg     200 mg Oral  Once 08/15/19 1357 08/15/19 2114   08/15/19 1800  sulfaDIAZINE tablet 1,000 mg     1,000 mg Oral Every 6 hours 08/15/19 1337     08/14/19 1000  demeclocycline (DECLOMYCIN) tablet 150 mg  Status:  Discontinued     150 mg Oral Every 12 hours 08/14/19 0848 08/14/19 0907   08/14/19 1000  demeclocycline (DECLOMYCIN) tablet 150 mg     150 mg Per Tube Every 12 hours 08/14/19 0907     08/14/19 1000  fluconazole (DIFLUCAN) tablet 200 mg     200 mg Per Tube Daily 08/14/19 0907 08/19/19 0959   08/13/19 1015  fluconazole (DIFLUCAN) tablet 200 mg  Status:  Discontinued     200 mg Oral Daily 08/13/19 1009 08/14/19 0907   08/13/19 1000  penicillin g benzathine (BICILLIN LA) 1200000 UNIT/2ML injection 2.4 Million Units     2.4 Million Units Intramuscular  Once 08/08/19 1019 08/13/19 1222   08/12/19 1400  sulfamethoxazole-trimethoprim (BACTRIM) 272.48 mg in dextrose 5 % 500 mL IVPB  Status:  Discontinued     10 mg/kg/day  54.5 kg 344.7 mL/hr over 90 Minutes Intravenous Every 12 hours 08/12/19 0854 08/15/19 1337   08/11/19 1400  sulfamethoxazole-trimethoprim (BACTRIM) 400-80 MG per tablet 3 tablet  Status:  Discontinued     3 tablet Oral Every 8 hours 08/11/19 1349 08/12/19 0854   08/10/19 1000  abacavir-dolutegravir-lamiVUDine (TRIUMEQ) 600-50-300 MG per tablet 1 tablet  Status:  Discontinued     1 tablet Oral Daily 08/09/19 1514 08/12/19 0848   08/10/19 1000  sulfamethoxazole-trimethoprim (BACTRIM) 160 mg in dextrose 5 % 250 mL IVPB  Status:  Discontinued     160 mg 260 mL/hr over 60 Minutes Intravenous Daily 08/09/19 1514 08/11/19 1324   08/09/19 1730  metroNIDAZOLE (FLAGYL) IVPB 500 mg     500  mg 100 mL/hr over 60 Minutes Intravenous Every 8 hours 08/09/19 1636 08/13/19 1848   08/08/19 1000  rifampin (RIFADIN) 60 mg/mL oral suspension 600 mg  Status:  Discontinued     600 mg Per Tube Daily 08/07/19 1106 08/07/19 1319   08/08/19 1000  bictegravir-emtricitabine-tenofovir AF (BIKTARVY) 50-200-25 MG per tablet 1 tablet  Status:  Discontinued     1 tablet Oral Daily 08/07/19 1419 08/09/19 1514   08/07/19 1330  bictegravir-emtricitabine-tenofovir AF (BIKTARVY) 50-200-25 MG per tablet 1 tablet  Status:  Discontinued     1 tablet Oral Daily 08/07/19 1319 08/07/19 1419   08/07/19 1000  ethambutol (MYAMBUTOL) tablet 1,200 mg  Status:  Discontinued     1,200 mg Per Tube Daily 08/06/19 1423 08/07/19 1319   08/07/19 1000  isoniazid (NYDRAZID) tablet 300 mg  Status:  Discontinued     300 mg Per Tube Daily 08/06/19 1423 08/07/19 1319   08/07/19 1000  rifampin (RIFADIN) 60 mg/mL oral suspension 600 mg  Status:  Discontinued     600 mg Oral Daily 08/06/19 1423 08/07/19 1106   08/07/19 1000  sulfamethoxazole-trimethoprim (BACTRIM DS) 800-160 MG per tablet 1 tablet  Status:  Discontinued     1 tablet Per Tube Daily 08/06/19 1423 08/09/19 1514   08/07/19 1000  pyrazinamide tablet 1,500 mg  Status:  Discontinued     1,500 mg Per Tube Daily 08/06/19 1423 08/07/19 1319   08/03/19 1600  fluconazole (DIFLUCAN) IVPB 200 mg  Status:  Discontinued     200 mg 100 mL/hr over 60 Minutes Intravenous Every 24 hours 08/03/19 1548 08/13/19 1009   07/30/19 1700  rifampin (RIFADIN) capsule 600 mg  Status:  Discontinued     600 mg Oral Daily 07/30/19 1604 08/06/19 1423   07/30/19 1700  isoniazid (NYDRAZID) tablet 300 mg  Status:  Discontinued     300 mg Oral Daily 07/30/19 1604 08/06/19 1423   07/30/19 1700  pyrazinamide tablet 1,500 mg  Status:  Discontinued     1,500 mg Oral Daily 07/30/19 1604 08/06/19 1423   07/30/19 1700  ethambutol (MYAMBUTOL) tablet 1,200 mg  Status:  Discontinued     1,200 mg Oral Daily  07/30/19 1604 08/06/19 1423   07/29/19 1700  sulfamethoxazole-trimethoprim (BACTRIM DS) 800-160 MG per tablet 1 tablet  Status:  Discontinued     1 tablet Oral Daily 07/29/19 1601 08/06/19 1423   07/29/19 1700  ganciclovir (CYTOVENE) 285 mg in sodium chloride 0.9 % 100 mL IVPB  Status:  Discontinued     5 mg/kg  57 kg 100 mL/hr over 60 Minutes Intravenous Every 12 hours 07/29/19 1603 08/07/19 1526   07/29/19 1630  penicillin G potassium 12 Million Units in dextrose 5 % 250 mL IVPB  Status:  Discontinued     12 Million Units 250 mL/hr over 60 Minutes Intravenous Every 12 hours 07/29/19 1552 07/29/19 1559   07/29/19 1630  penicillin G potassium 12 Million Units in dextrose 5 % 500 mL continuous infusion     12 Million Units 41.7 mL/hr over 12 Hours Intravenous Every 12 hours 07/29/19 1559 08/12/19 2359       Objective: Vitals:   08/16/19 2300 08/17/19 0300 08/17/19 0714 08/17/19 0800  BP: 101/78 110/86 108/76 103/77  Pulse: 86 98 98 98  Resp: (!) 21 (!) Temp: 97.9 F (36.6 C) (!) 97.4 F (36.3 C) 97.8 F (36.6 C) 97.8 F (36.6 C)  TempSrc: Axillary Oral Oral Oral  SpO2: 95% 98%  98%  Weight:      Height:        Intake/Output Summary (Last 24 hours) at 08/17/2019 0941 Last data filed at 08/17/2019 0400 Gross per 24 hour  Intake 620 ml  Output 1676 ml  Net -1056 ml   Filed Weights   08/14/19 0500 08/15/19 0304 08/16/19 0515  Weight: 54.7 kg 52.9 kg 51.5 kg    Examination: General exam: Appears calm and comfortable  Respiratory system: Clear to auscultation. Respiratory effort normal. Cardiovascular system: S1 & S2 heard, RRR. No pedal edema. Gastrointestinal system: Abdomen is nondistended, soft and nontender.  Central nervous system: Alert Extremities: Symmetric in appearance bilaterally  Skin: No rashes, lesions or ulcers on exposed skin      Data Reviewed: I have personally reviewed following labs and imaging studies  CBC: Recent Labs  Lab  08/12/19 1020 08/13/19 0932 08/14/19 0230 08/15/19 0754 08/16/19 0441  WBC 2.6* 2.1* 2.2* 2.1* 2.2*  NEUTROABS 1.5* 1.1*  --   --   --   HGB 14.5 13.9 14.8 15.5 14.9  HCT 40.9 38.9* 41.2 43.9 43.0  MCV 84.9 84.6 83.6 85.4 86.2  PLT 334 323 337 303 339   Basic Metabolic Panel: Recent Labs  Lab 08/13/19 0932  08/14/19 0230  08/15/19  [id number]  08/16/19 0441 08/16/19 0932 08/16/19 1444 08/16/19 2122 08/17/19 0213 08/17/19 0819  NA 118*   < > 121*   < > 127*   < > 129* 130* 130* 131* 130* 132*  K 4.6  --  4.1  --  5.2*  --  5.0  --   --   --  5.0  --   CL 86*  --  86*  --  92*  --  91*  --   --   --  91*  --   CO2 23  --  23  --  25  --  27  --   --   --  26  --   GLUCOSE 98  --  95  --  131*  --  151*  --   --   --  148*  --   BUN 17  --  22*  --  29*  --  40*  --   --   --  53*  --   CREATININE 1.01  --  1.13  --  1.15  --  1.18  --   --   --  1.56*  --   CALCIUM 9.1  --  9.3  --  9.0  --  9.4  --   --   --  9.5  --    < > = values in this interval not displayed.   GFR: Estimated Creatinine Clearance: 47.7 mL/min (A) (by C-G formula based on SCr of 1.56 mg/dL (H)). Liver Function Tests: Recent Labs  Lab 08/12/19 1020 08/13/19 0932  AST 25 29  ALT 18 18  ALKPHOS 45 48  BILITOT 0.3 0.4  PROT 8.7* 8.4*  ALBUMIN 3.1* 2.9*   No results for input(s): LIPASE, AMYLASE in the last 168 hours. No results for input(s): AMMONIA in the last 168 hours. Coagulation Profile: No results for input(s): INR, PROTIME in the last 168 hours. Cardiac Enzymes: No results for input(s): CKTOTAL, CKMB, CKMBINDEX, TROPONINI in the last 168 hours. BNP (last 3 results) No results for input(s): PROBNP in the last 8760 hours. HbA1C: No results for input(s): HGBA1C in the last 72 hours. CBG: Recent Labs  Lab 08/16/19 1636 08/16/19 2032 08/17/19 0018 08/17/19 0356 08/17/19 0723  GLUCAP 155* 88 139* 145* 146*   Lipid Profile: No results for input(s): CHOL, HDL, LDLCALC, TRIG, CHOLHDL,  LDLDIRECT in the last 72 hours. Thyroid Function Tests: No results for input(s): TSH, T4TOTAL, FREET4, T3FREE, THYROIDAB in the last 72 hours. Anemia Panel: No results for input(s): VITAMINB12, FOLATE, FERRITIN, TIBC, IRON, RETICCTPCT in the last 72 hours. Sepsis Labs: No results for input(s): PROCALCITON, LATICACIDVEN in the last 168 hours.  Recent Results (from the past 240 hour(s))  MRSA PCR Screening     Status: None   Collection Time: 08/10/19  1:35 PM   Specimen: Nasopharyngeal  Result Value Ref Range Status   MRSA by PCR NEGATIVE NEGATIVE Final    Comment:        The GeneXpert MRSA Assay (FDA approved for NASAL specimens only), is one component of a comprehensive MRSA colonization surveillance program. It is not intended to diagnose MRSA infection nor to guide or monitor treatment for MRSA infections. Performed at [facility], [address] [place], [place] [id number]       Radiology Studies: No results found.    Scheduled Meds: . Chlorhexidine Gluconate Cloth  6 each Topical Daily  . demeclocycline  150 mg Per  Tube Q12H  . enoxaparin (LOVENOX) injection  40 mg Subcutaneous Q24H  . famotidine  20 mg Per Tube Daily  . feeding supplement (OSMOLITE 1.5 CAL)  1,000 mL Per Tube Q24H  . feeding supplement (PRO-STAT SUGAR FREE 64)  30 mL Per Tube TID  . fluconazole  200 mg Per Tube Daily  . furosemide  80 mg Intravenous BID  . insulin aspart  0-9 Units Subcutaneous Q4H  . magic mouthwash w/lidocaine  10 mL Oral TID AC & HS  . mouth rinse  15 mL Mouth Rinse BID  . multivitamin with minerals  1 tablet Per Tube Daily  . Pyrimethamine-Leucovorin  50 mg Oral Daily  . sodium chloride  1 g Per Tube TID WC  . sulfaDIAZINE  1,000 mg Oral Q6H   Continuous Infusions: . chlorproMAZINE (THORAZINE) IV 12.5 mg (08/09/19 1135)     LOS: 19 days      Time spent: 25 minutes   Dessa Phi, DO [facility] 08/17/2019, 9:41 AM   Available via Epic secure  chat 7am-7pm After these hours, please refer to coverage provider listed on [email or web address]

## 2019-08-17 NOTE — Progress Notes (Signed)
Pt is alert to self, nods his head per calling his name, mother is in bed side, Osmolite continued, turning and positioning maintained throughout the day, vitals stable, will continue to monitor the patient  Palma Holter, RN

## 2019-08-18 ENCOUNTER — Inpatient Hospital Stay (HOSPITAL_COMMUNITY): Payer: Medicaid Other

## 2019-08-18 LAB — BASIC METABOLIC PANEL
Anion gap: 12 (ref 5–15)
BUN: 67 mg/dL — ABNORMAL HIGH (ref 6–20)
CO2: 28 mmol/L (ref 22–32)
Calcium: 9.6 mg/dL (ref 8.9–10.3)
Chloride: 92 mmol/L — ABNORMAL LOW (ref 98–111)
Creatinine, Ser: 1.69 mg/dL — ABNORMAL HIGH (ref 0.61–1.24)
GFR calc Af Amer: 59 mL/min — ABNORMAL LOW (ref >60)
GFR calc non Af Amer: 51 mL/min — ABNORMAL LOW (ref >60)
Glucose, Bld: 149 mg/dL — ABNORMAL HIGH (ref 70–99)
Potassium: 4.8 mmol/L (ref 3.5–5.1)
Sodium: 132 mmol/L — ABNORMAL LOW (ref 135–145)

## 2019-08-18 LAB — SODIUM
Sodium: 133 mmol/L — ABNORMAL LOW (ref 135–145)
Sodium: 136 mmol/L (ref 135–145)
Sodium: 137 mmol/L (ref 135–145)

## 2019-08-18 LAB — GLUCOSE, CAPILLARY
Glucose-Capillary: 131 mg/dL — ABNORMAL HIGH (ref 70–99)
Glucose-Capillary: 137 mg/dL — ABNORMAL HIGH (ref 70–99)
Glucose-Capillary: 139 mg/dL — ABNORMAL HIGH (ref 70–99)
Glucose-Capillary: 141 mg/dL — ABNORMAL HIGH (ref 70–99)
Glucose-Capillary: 147 mg/dL — ABNORMAL HIGH (ref 70–99)
Glucose-Capillary: 157 mg/dL — ABNORMAL HIGH (ref 70–99)

## 2019-08-18 LAB — CBC
HCT: 43.3 % (ref 39.0–52.0)
Hemoglobin: 14.7 g/dL (ref 13.0–17.0)
MCH: 29.9 pg (ref 26.0–34.0)
MCHC: 33.9 g/dL (ref 30.0–36.0)
MCV: 88.2 fL (ref 80.0–100.0)
Platelets: 350 10*3/uL (ref 150–400)
RBC: 4.91 MIL/uL (ref 4.22–5.81)
RDW: 13.7 % (ref 11.5–15.5)
WBC: 3.2 10*3/uL — ABNORMAL LOW (ref 4.0–10.5)
nRBC: 0 % (ref 0.0–0.2)

## 2019-08-18 IMAGING — DX DG CHEST 1V PORT
1 series · 1 of 1 positions shown · non-contrast
Comparison: [DATE] chest radiograph.

CLINICAL DATA: Aspiration

EXAM:
PORTABLE CHEST 1 VIEW

[chest ap]
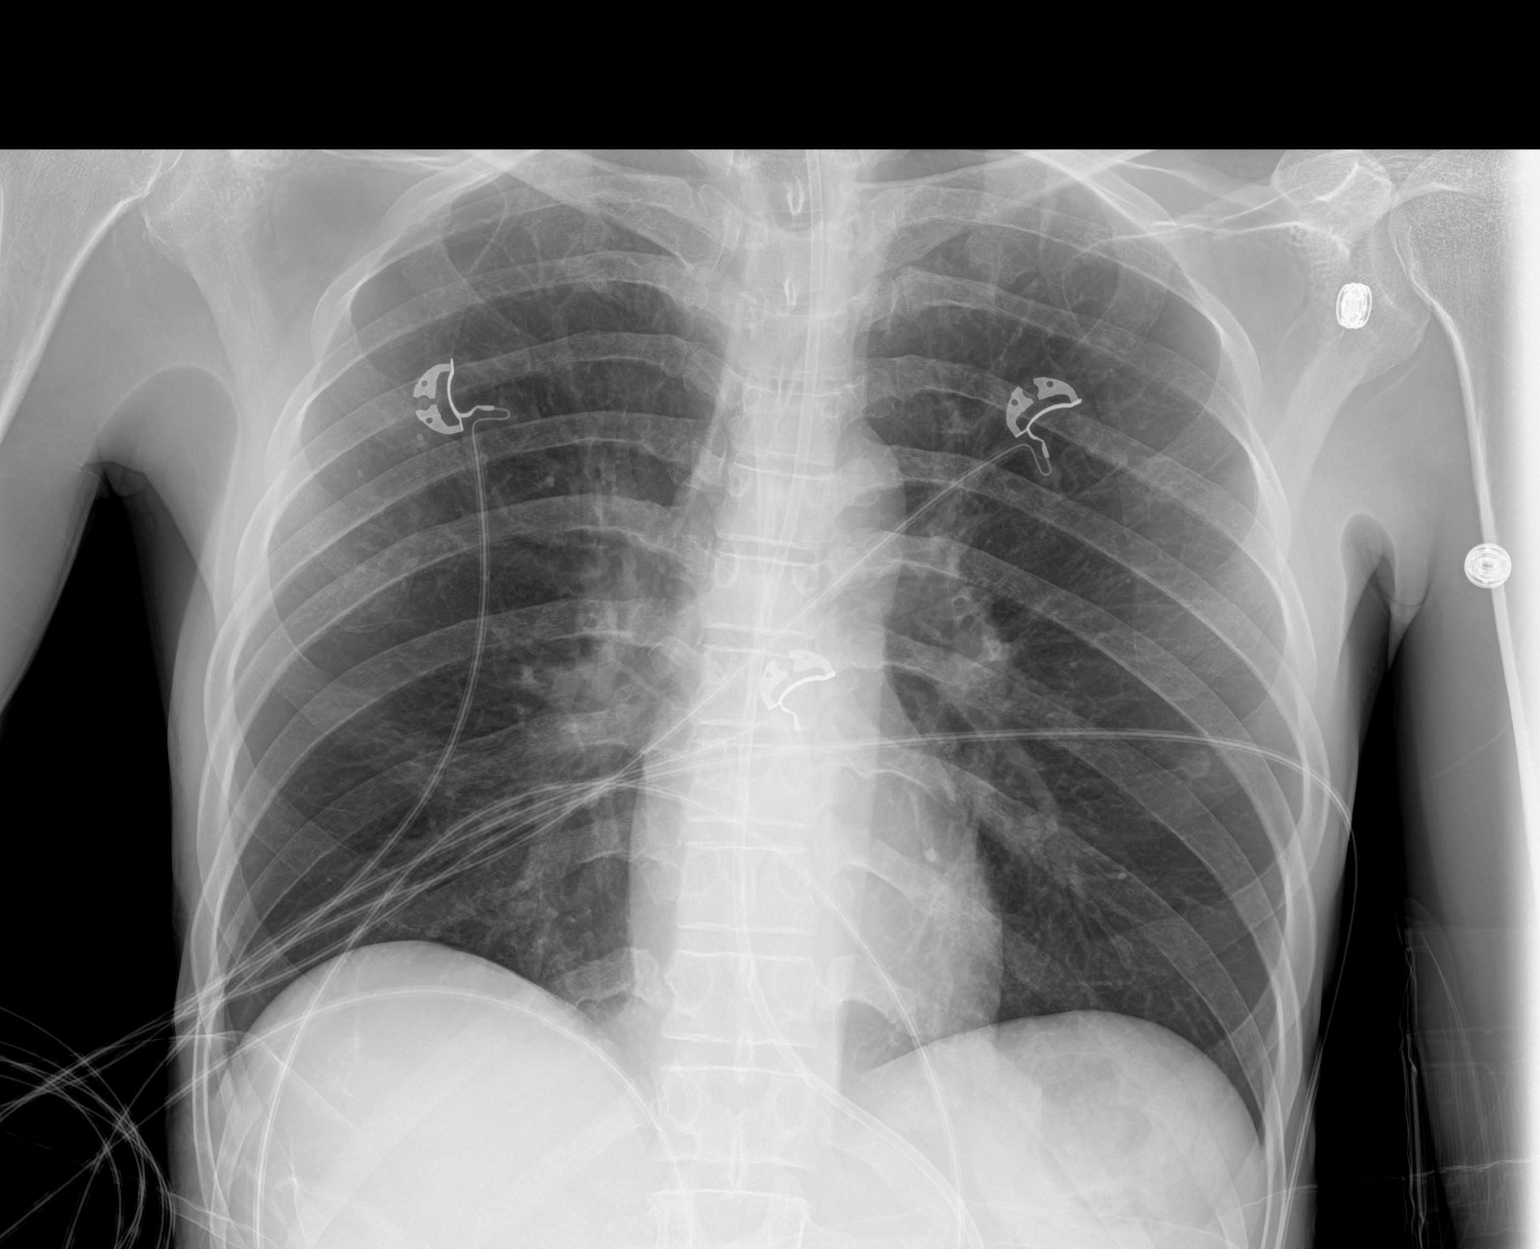

[1 of 1 positions shown; findings below may reference images not displayed]

FINDINGS: Enteric tube enters stomach with the tip not seen on this image.
stable cardiomediastinal silhouette with normal heart size. No
pneumothorax. No pleural effusion. Lungs appear clear, with no acute
consolidative airspace disease and no pulmonary edema.
IMPRESSION: 1. Enteric tube enters stomach with the tip not seen on this image.
2. No active cardiopulmonary disease.

## 2019-08-18 MED ORDER — GUAIFENESIN-DM 100-10 MG/5ML PO SYRP
5.0000 mL | ORAL_SOLUTION | ORAL | Status: DC | PRN
  Administered 2019-08-18 (×2): 5 mL via ORAL
  Filled 2019-08-18 (×3): qty 5

## 2019-08-18 MED ORDER — FUROSEMIDE 80 MG PO TABS
80.0000 mg | ORAL_TABLET | Freq: Two times a day (BID) | ORAL | Status: DC
  Administered 2019-08-18: 80 mg
  Filled 2019-08-18: qty 1

## 2019-08-18 MED ORDER — IPRATROPIUM-ALBUTEROL 0.5-2.5 (3) MG/3ML IN SOLN: 3.0000 mL | Freq: Four times a day (QID) | RESPIRATORY_TRACT | Status: DC | PRN

## 2019-08-18 MED ORDER — SODIUM CHLORIDE 1 G PO TABS
2.0000 g | ORAL_TABLET | Freq: Three times a day (TID) | ORAL | Status: AC
  Administered 2019-08-18 – 2019-08-20 (×9): 2 g
  Filled 2019-08-18 (×9): qty 2

## 2019-08-18 MED ORDER — FUROSEMIDE 80 MG PO TABS: 80.0000 mg | ORAL_TABLET | Freq: Two times a day (BID) | ORAL | Status: DC

## 2019-08-18 MED ORDER — DM-GUAIFENESIN ER 30-600 MG PO TB12: 1.0000 | ORAL_TABLET | Freq: Two times a day (BID) | ORAL | Status: DC

## 2019-08-18 NOTE — Progress Notes (Signed)
Hospital does not have that kind of bed to facilitate his CPT, but pt is changed to the air mattress bed and he is more comfortable now, mother is in bed side, actively engaging him on talking and using flutter valve Will continue to monitor the patient  Palma Holter, RN

## 2019-08-18 NOTE — Progress Notes (Addendum)
RN reach out MD Maylene Roes,  regarding pt's rhonchi status on respiratory assessment, feeding stopped this time per order, CXR will be performed to evaluate possible aspiration.  CXR done at 9.55am, will hold AM medicines until results come back  @1055  Fluid resumed as x-ray negative for aspiration and morning medicines provided.  Meanwhile contacted Respiratory therapist for chest physio and neb treatment  Will continue to monitor the patient  Palma Holter, RN

## 2019-08-18 NOTE — Plan of Care (Signed)

## 2019-08-18 NOTE — Progress Notes (Signed)
PROGRESS NOTE    Arthur [name]  JQB:[id number] DOB: 1981/04/17 DOA: 07/28/2019 PCP: Arthur Hiss, MD     Brief Narrative:  Arthur [name] is a 38 year old male with past medical history significant for HIV, substance abuse, depression and GERD who presented to the emergency department on 11/1 with chief complaint of dizziness, weakness and intermittent headache. He has not taken any of his HIV medications for about 1 year due to affordability issue. MRI brain done in ED showed widespread areas of abnormal low level restricted diffusion and postcontrast enhancement throughout both cerebral hemispheres, predominantly infratentorial, with the dominant abnormality in the left anterior frontal white matter consistent with an opportunistic infection, such as toxoplasmosis, tuberculosis or fungal cerebritis/meningitis, or parasitic infection. Patient was admitted to hospital service and ID was consulted. He then revealed that he has lost his vision in his right eye. ID started him on penicillin and ganciclovir for possible CMV retinitis versus synovitis retinitis and ophthalmology consulted.  11/3: status post lumbar puncture by neurology 11/15: transferred to ICU for tx of severe hyponatremia/SIADH for tx with hypertonic saline as his mental status was worse, and respiratory status was declining 11/16: transferred to SDU  New events last 24 hours / Subjective: No acute events overnight. Alert but not interactive with me this morning.    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 RIPEtx stopped -Appreciate ID. Completed PCN for 14 days. Additional IM bicillin on 11/25  -Continue pyrimethamin-leucovorin, sulfadiazine   Acute toxic metabolic encephalopathy, multifactorial including CNS infection and SIADH -Repeat CT head without new process, evolving lesions of cerebellum and left frontal lobe -Patient remains alert, minimally interactive, will sometimes nod/shake head to yes or no questions per nursing   Right eye blindness, optic retinitis/neurititis -Mainly concerned between syphilis and CMV-butCMV PCR negative in the CNS -Appreciate ophthalmology  -Received steroid injection 08/02/2019 -Repeat MRIbrain and orbits show improvement of brain lesions and optic neurititis, retinitis  Severehyponatremia -Likely 2/2severeSIADH -Transferred to ICU for 3% saline and IV lasix -Nephrology consulted; signed off 11/20  -Monitor sodium level closely, 132 this morning -Continue lasix, salt tab, Declomycin   AKI -Cr trending up last 2 days. 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 Seen by palliative care. Currently recommending continuing aggressive measures   DVT prophylaxis: Lovenox Code Status: Full Family Communication: None at bedside Disposition Plan: Continue inpatient treatment. SNF when stable. Continue SLP eval, hopefully can advance PO and discontinue cortrak soon. If not, may have to consider PEG      Antimicrobials:  Anti-infectives (From admission, onward)   Start     Dose/Rate  Route Frequency Ordered Stop   08/16/19 2000  Pyrimethamine-Leucovorin 50-25 MG  CAPS 50 mg  Status:  Discontinued     50 mg Oral Daily 08/15/19 1337 08/15/19 1357   08/16/19 2000  Pyrimethamine-Leucovorin 50-25 MG CAPS 50 mg     50 mg Oral Daily 08/15/19 1357     08/15/19 2000  Pyrimethamine-Leucovorin 50-25 MG CAPS 200 mg  Status:  Discontinued     200 mg Oral  Once 08/15/19 1337 08/15/19 1357   08/15/19 2000  Pyrimethamine-Leucovorin 50-25 MG CAPS 200 mg     200 mg Oral  Once 08/15/19 1357 08/15/19 2114   08/15/19 1800  sulfaDIAZINE tablet 1,000 mg     1,000 mg Oral Every 6 hours 08/15/19 1337     08/14/19 1000  demeclocycline (DECLOMYCIN) tablet 150 mg  Status:  Discontinued     150 mg Oral Every 12 hours 08/14/19 0848 08/14/19 0907   08/14/19 1000  demeclocycline (DECLOMYCIN) tablet 150 mg     150 mg Per Tube Every 12 hours 08/14/19 0907     08/14/19 1000  fluconazole (DIFLUCAN) tablet 200 mg     200 mg Per Tube Daily 08/14/19 0907 08/19/19 0959   08/13/19 1015  fluconazole (DIFLUCAN) tablet 200 mg  Status:  Discontinued     200 mg Oral Daily 08/13/19 1009 08/14/19 0907   08/13/19 1000  penicillin g benzathine (BICILLIN LA) 1200000 UNIT/2ML injection 2.4 Million Units     2.4 Million Units Intramuscular  Once 08/08/19 1019 08/13/19 1222   08/12/19 1400  sulfamethoxazole-trimethoprim (BACTRIM) 272.48 mg in dextrose 5 % 500 mL IVPB  Status:  Discontinued     10 mg/kg/day  54.5 kg 344.7 mL/hr over 90 Minutes Intravenous Every 12 hours 08/12/19 0854 08/15/19 1337   08/11/19 1400  sulfamethoxazole-trimethoprim (BACTRIM) 400-80 MG per tablet 3 tablet  Status:  Discontinued     3 tablet Oral Every 8 hours 08/11/19 1349 08/12/19 0854   08/10/19 1000  abacavir-dolutegravir-lamiVUDine (TRIUMEQ) 600-50-300 MG per tablet 1 tablet  Status:  Discontinued     1 tablet Oral Daily 08/09/19 1514 08/12/19 0848   08/10/19 1000  sulfamethoxazole-trimethoprim (BACTRIM) 160 mg in dextrose 5 % 250 mL IVPB   Status:  Discontinued     160 mg 260 mL/hr over 60 Minutes Intravenous Daily 08/09/19 1514 08/11/19 1324   08/09/19 1730  metroNIDAZOLE (FLAGYL) IVPB 500 mg     500 mg 100 mL/hr over 60 Minutes Intravenous Every 8 hours 08/09/19 1636 08/13/19 1848   08/08/19 1000  rifampin (RIFADIN) 60 mg/mL oral suspension 600 mg  Status:  Discontinued     600 mg Per Tube Daily 08/07/19 1106 08/07/19 1319   08/08/19 1000  bictegravir-emtricitabine-tenofovir AF (BIKTARVY) 50-200-25 MG per tablet 1 tablet  Status:  Discontinued     1 tablet Oral Daily 08/07/19 1419 08/09/19 1514   08/07/19 1330  bictegravir-emtricitabine-tenofovir AF (BIKTARVY) 50-200-25 MG per tablet 1 tablet  Status:  Discontinued     1 tablet Oral Daily 08/07/19 1319 08/07/19 1419   08/07/19 1000  ethambutol (MYAMBUTOL) tablet 1,200 mg  Status:  Discontinued     1,200 mg Per Tube Daily 08/06/19 1423 08/07/19 1319   08/07/19 1000  isoniazid (NYDRAZID) tablet 300 mg  Status:  Discontinued     300 mg Per Tube Daily 08/06/19 1423 08/07/19 1319   08/07/19 1000  rifampin (RIFADIN) 60 mg/mL oral suspension 600 mg  Status:  Discontinued     600 mg Oral Daily 08/06/19 1423 08/07/19 1106   08/07/19 1000  sulfamethoxazole-trimethoprim (BACTRIM DS) 800-160  MG per tablet 1 tablet  Status:  Discontinued     1 tablet Per Tube Daily 08/06/19 1423 08/09/19 1514   08/07/19 1000  pyrazinamide tablet 1,500 mg  Status:  Discontinued     1,500 mg Per Tube Daily 08/06/19 1423 08/07/19 1319   08/03/19 1600  fluconazole (DIFLUCAN) IVPB 200 mg  Status:  Discontinued     200 mg 100 mL/hr over 60 Minutes Intravenous Every 24 hours 08/03/19 1548 08/13/19 1009   07/30/19 1700  rifampin (RIFADIN) capsule 600 mg  Status:  Discontinued     600 mg Oral Daily 07/30/19 1604 08/06/19 1423   07/30/19 1700  isoniazid (NYDRAZID) tablet 300 mg  Status:  Discontinued     300 mg Oral Daily 07/30/19 1604 08/06/19 1423   07/30/19 1700  pyrazinamide tablet 1,500 mg  Status:   Discontinued     1,500 mg Oral Daily 07/30/19 1604 08/06/19 1423   07/30/19 1700  ethambutol (MYAMBUTOL) tablet 1,200 mg  Status:  Discontinued     1,200 mg Oral Daily 07/30/19 1604 08/06/19 1423   07/29/19 1700  sulfamethoxazole-trimethoprim (BACTRIM DS) 800-160 MG per tablet 1 tablet  Status:  Discontinued     1 tablet Oral Daily 07/29/19 1601 08/06/19 1423   07/29/19 1700  ganciclovir (CYTOVENE) 285 mg in sodium chloride 0.9 % 100 mL IVPB  Status:  Discontinued     5 mg/kg  57 kg 100 mL/hr over 60 Minutes Intravenous Every 12 hours 07/29/19 1603 08/07/19 1526   07/29/19 1630  penicillin G potassium 12 Million Units in dextrose 5 % 250 mL IVPB  Status:  Discontinued     12 Million Units 250 mL/hr over 60 Minutes Intravenous Every 12 hours 07/29/19 1552 07/29/19 1559   07/29/19 1630  penicillin G potassium 12 Million Units in dextrose 5 % 500 mL continuous infusion     12 Million Units 41.7 mL/hr over 12 Hours Intravenous Every 12 hours 07/29/19 1559 08/12/19 2359       Objective: Vitals:   08/18/19 0000 08/18/19 0400 08/18/19 0500 08/18/19 0800  BP: (!) 118/92 113/77  113/75  Pulse: (!) 107 (!) 102  (!) 103  Resp: (!) 24 16  18   Temp:  97.8 F (36.6 C)  98 F (36.7 C)  TempSrc:  Axillary  Axillary  SpO2: 95% 97%  96%  Weight:   47.2 kg   Height:        Intake/Output Summary (Last 24 hours) at 08/18/2019 0906 Last data filed at 08/18/2019 0800 Gross per 24 hour  Intake 1030 ml  Output 1500 ml  Net -470 ml   Filed Weights   08/15/19 0304 08/16/19 0515 08/18/19 0500  Weight: 52.9 kg 51.5 kg 47.2 kg    Examination: General exam: Appears calm and comfortable  Respiratory system: Clear to auscultation. Respiratory effort normal. Cardiovascular system: S1 & S2 heard, RRR. No pedal edema. Gastrointestinal system: Abdomen is nondistended, soft and nontender. Normal bowel sounds heard. Central nervous system: Alert   Extremities: Symmetric in appearance bilaterally    Skin: No rashes, lesions or ulcers on exposed skin       Data Reviewed: I have personally reviewed following labs and imaging studies  CBC: Recent Labs  Lab 08/12/19 1020 08/13/19 0932 08/14/19 0230 08/15/19 0754 08/16/19 0441 08/18/19 0217  WBC 2.6* 2.1* 2.2* 2.1* 2.2* 3.2*  NEUTROABS 1.5* 1.1*  --   --   --   --   HGB 14.5 13.9 14.8 15.5 14.9  14.7  HCT 40.9 38.9* 41.2 43.9 43.0 43.3  MCV 84.9 84.6 83.6 85.4 86.2 88.2  PLT 334 323 337 303 339 401   Basic Metabolic Panel: Recent Labs  Lab 08/14/19 0230  08/15/19 0754  08/16/19 0441  08/17/19 0213 08/17/19 0819 08/17/19 1441 08/17/19 2046 08/18/19 0217 08/18/19 0807  NA 121*   < > 127*   < > 129*   < > 130* 132* 131* 128* 132* 133*  K 4.1  --  5.2*  --  5.0  --  5.0  --   --   --  4.8  --   CL 86*  --  92*  --  91*  --  91*  --   --   --  92*  --   CO2 23  --  25  --  27  --  26  --   --   --  28  --   GLUCOSE 95  --  131*  --  151*  --  148*  --   --   --  149*  --   BUN 22*  --  29*  --  40*  --  53*  --   --   --  67*  --   CREATININE 1.13  --  1.15  --  1.18  --  1.56*  --   --   --  1.69*  --   CALCIUM 9.3  --  9.0  --  9.4  --  9.5  --   --   --  9.6  --    < > = values in this interval not displayed.   GFR: Estimated Creatinine Clearance: 40.3 mL/min (A) (by C-G formula based on SCr of 1.69 mg/dL (H)). Liver Function Tests: Recent Labs  Lab 08/12/19 1020 08/13/19 0932  AST 25 29  ALT 18 18  ALKPHOS 45 48  BILITOT 0.3 0.4  PROT 8.7* 8.4*  ALBUMIN 3.1* 2.9*   No results for input(s): LIPASE, AMYLASE in the last 168 hours. No results for input(s): AMMONIA in the last 168 hours. Coagulation Profile: No results for input(s): INR, PROTIME in the last 168 hours. Cardiac Enzymes: No results for input(s): CKTOTAL, CKMB, CKMBINDEX, TROPONINI in the last 168 hours. BNP (last 3 results) No results for input(s): PROBNP in the last 8760 hours. HbA1C: No results for input(s): HGBA1C in the last 72  hours. CBG: Recent Labs  Lab 08/17/19 1612 08/17/19 2004 08/17/19 2348 08/18/19 0455 08/18/19 0739  GLUCAP 151* 162* 137* 147* 141*   Lipid Profile: No results for input(s): CHOL, HDL, LDLCALC, TRIG, CHOLHDL, LDLDIRECT in the last 72 hours. Thyroid Function Tests: No results for input(s): TSH, T4TOTAL, FREET4, T3FREE, THYROIDAB in the last 72 hours. Anemia Panel: No results for input(s): VITAMINB12, FOLATE, FERRITIN, TIBC, IRON, RETICCTPCT in the last 72 hours. Sepsis Labs: No results for input(s): PROCALCITON, LATICACIDVEN in the last 168 hours.  Recent Results (from the past 240 hour(s))  MRSA PCR Screening     Status: None   Collection Time: 08/10/19  1:35 PM   Specimen: Nasopharyngeal  Result Value Ref Range Status   MRSA by PCR NEGATIVE NEGATIVE Final    Comment:        The GeneXpert MRSA Assay (FDA approved for NASAL specimens only), is one component of a comprehensive MRSA colonization surveillance program. It is not intended to diagnose MRSA infection nor to guide or monitor treatment for MRSA infections. Performed at [facility], [address], [place], [place] [address]       Radiology Studies: Ct Head Wo Contrast  Result Date: 08/17/2019 CLINICAL DATA:  Weakness.  Encephalopathy. EXAM: CT HEAD WITHOUT CONTRAST TECHNIQUE: Contiguous axial images were obtained from the base of the skull through the vertex without intravenous contrast. COMPARISON:  Head CT 20 days ago.  Interval brain MRI FINDINGS: Brain: High-density areas within the bilateral cerebellum have resolved. In place are newly seen low-density areas with patchy distribution on both sides. The extent is similar to signal abnormality in the bilateral cerebellum on interval brain MRI. At the site of a large anterior subcortical left frontal lesion there is new high density which could be petechial hemorrhage or mineralization. Suspect there is decreased mass effect at this level. Age advanced  brain atrophy in the setting of HIV. No hematoma, hydrocephalus, or collection. Vascular: Negative Skull: Negative Sinuses/Orbits: Negative IMPRESSION: 1. No new process. 2. Evolving lesions of the cerebellum and left frontal lobe, extent of disease is underestimated relative to prior MRI. A Left frontal lesion is now mildly high density which could be from mineralization or petechial hemorrhage. 3. Atrophy. Electronically Signed   By: Marnee Spring M.D.   On: 08/17/2019 12:14      Scheduled Meds:  Chlorhexidine Gluconate Cloth  6 each Topical Daily   demeclocycline  150 mg Per Tube Q12H   enoxaparin (LOVENOX) injection  40 mg Subcutaneous Q24H   famotidine  20 mg Per Tube Daily   feeding supplement (OSMOLITE 1.5 CAL)  1,000 mL Per Tube Q24H   feeding supplement (PRO-STAT SUGAR FREE 64)  30 mL Per Tube TID   fluconazole  200 mg Per Tube Daily   [START ON 08/19/2019] furosemide  80 mg Per Tube BID   insulin aspart  0-9 Units Subcutaneous Q4H   magic mouthwash w/lidocaine  10 mL Oral TID AC & HS   mouth rinse  15 mL Mouth Rinse BID   multivitamin with minerals  1 tablet Per Tube Daily   Pyrimethamine-Leucovorin  50 mg Oral Daily   sodium chloride  2 g Per Tube TID WC   sulfaDIAZINE  1,000 mg Oral Q6H   Continuous Infusions:  chlorproMAZINE (THORAZINE) IV 12.5 mg (08/09/19 1135)     LOS: 20 days      Time spent: 25 minutes   Noralee Stain, DO [facility] 08/18/2019, 9:06 AM   Available via Epic secure chat 7am-7pm After these hours, please refer to coverage provider listed on [email or web address]

## 2019-08-18 NOTE — Progress Notes (Signed)
Came into patient's room and introduced myself, explained that I was here to try and get him to do some CPT with flutter valve.  Patient effort was poor, it seemed he was trying to blow into flutter, but was unable to put forth a good effort at this time.  Will continue to monitor patient and will try again in 4 hours.

## 2019-08-18 NOTE — Progress Notes (Signed)
Md notified. Pt sounding increased rhonchi unable to get sputum up.  Also noted Na lab 128 was 131.  Md made aware.  Will continue to monitor Saunders Revel T

## 2019-08-18 NOTE — Progress Notes (Addendum)
Patient assessed on room air.  Stable VS  charted.  No respiratory distress noted.  Patient's bs were clear initially.  He attempted to cough a few times but was too weak to expectorate.  Flutter/Aerobika attempted unsuccessfully.    Pt was too weak to move forward and could not assist [place] when attempting to auscultate his back.  RN Palma Holter at the bedside.  Discussed the need for a total care bed to facilitate with turning and CPT.

## 2019-08-19 DIAGNOSIS — E43 Unspecified severe protein-calorie malnutrition: Secondary | ICD-10-CM | POA: Insufficient documentation

## 2019-08-19 DIAGNOSIS — R112 Nausea with vomiting, unspecified: Secondary | ICD-10-CM

## 2019-08-19 LAB — BASIC METABOLIC PANEL
Anion gap: 11 (ref 5–15)
BUN: 69 mg/dL — ABNORMAL HIGH (ref 6–20)
CO2: 27 mmol/L (ref 22–32)
Calcium: 9.5 mg/dL (ref 8.9–10.3)
Chloride: 99 mmol/L (ref 98–111)
Creatinine, Ser: 1.63 mg/dL — ABNORMAL HIGH (ref 0.61–1.24)
GFR calc Af Amer: >60 mL/min (ref >60)
GFR calc non Af Amer: 53 mL/min — ABNORMAL LOW (ref >60)
Glucose, Bld: 152 mg/dL — ABNORMAL HIGH (ref 70–99)
Potassium: 4.3 mmol/L (ref 3.5–5.1)
Sodium: 137 mmol/L (ref 135–145)

## 2019-08-19 LAB — SODIUM
Sodium: 138 mmol/L (ref 135–145)
Sodium: 138 mmol/L (ref 135–145)
Sodium: 139 mmol/L (ref 135–145)

## 2019-08-19 LAB — GLUCOSE, CAPILLARY
Glucose-Capillary: 109 mg/dL — ABNORMAL HIGH (ref 70–99)
Glucose-Capillary: 141 mg/dL — ABNORMAL HIGH (ref 70–99)
Glucose-Capillary: 150 mg/dL — ABNORMAL HIGH (ref 70–99)
Glucose-Capillary: 165 mg/dL — ABNORMAL HIGH (ref 70–99)
Glucose-Capillary: 125 mg/dL — ABNORMAL HIGH (ref 70–99)
Glucose-Capillary: 127 mg/dL — ABNORMAL HIGH (ref 70–99)

## 2019-08-19 MED ORDER — MIRTAZAPINE 7.5 MG PO TABS
15.0000 mg | ORAL_TABLET | Freq: Every day | ORAL | Status: DC
  Administered 2019-08-19: 15 mg via ORAL
  Filled 2019-08-19: qty 2

## 2019-08-19 MED ORDER — OSMOLITE 1.5 CAL PO LIQD
1000.0000 mL | ORAL | Status: DC
  Administered 2019-08-19 – 2019-08-29 (×10): 1000 mL
  Filled 2019-08-19 (×15): qty 1000

## 2019-08-19 MED ORDER — PRO-STAT SUGAR FREE PO LIQD
30.0000 mL | Freq: Every day | ORAL | Status: DC
  Administered 2019-08-20 – 2019-09-07 (×18): 30 mL
  Filled 2019-08-19 (×19): qty 30

## 2019-08-19 MED ORDER — FUROSEMIDE 40 MG PO TABS
40.0000 mg | ORAL_TABLET | Freq: Two times a day (BID) | ORAL | Status: DC
  Administered 2019-08-19 – 2019-08-20 (×2): 40 mg
  Filled 2019-08-19 (×2): qty 1

## 2019-08-19 MED ORDER — SULFAMETHOXAZOLE-TRIMETHOPRIM 800-160 MG PO TABS
1.0000 | ORAL_TABLET | ORAL | Status: DC
  Administered 2019-08-19 – 2019-08-21 (×2): 1 via ORAL
  Filled 2019-08-19 (×2): qty 1

## 2019-08-19 NOTE — Progress Notes (Signed)
[name].  PROGRESS NOTE    Arthur ParrMichael J Forti  XLK:[id number] DOB: 09/19/1981 DOA: 07/28/2019 PCP: Randall HissVan Dam, Cornelius N, MD   Brief Narrative:   Arthur [name] is a 38 year old male with past medical history significant for HIV, substance abuse, depression and GERD who presented to the emergency department on 11/1 with chief complaint of dizziness, weakness and intermittent headache. He has not taken any of his HIV medications for about 1 year due to affordability issue. MRI brain done in ED showed widespread areas of abnormal low level restricted diffusion and postcontrast enhancement throughout both cerebral hemispheres, predominantly infratentorial, with the dominant abnormality in the left anterior frontal white matter consistent with an opportunistic infection, such as toxoplasmosis, tuberculosis or fungal cerebritis/meningitis, or parasitic infection. Patient was admitted to hospital service and ID was consulted. He then revealed that he has lost his vision in his right eye. ID started him on penicillin and ganciclovir for possible CMV retinitis versus synovitis retinitis and ophthalmology consulted.  11/23: No acute events ON per nursing.    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 RIPEtx stopped     - Appreciate ID. Completed PCN for 14 days. Additional IM bicillin on 11/25      - current regimen: pyrimethamin-leucovorin, sulfadiazine     - a febrile ON,  VSS  Acute toxic metabolic encephalopathy, multifactorial including CNS infection and SIADH     - Repeat CT head without new process, evolving lesions of cerebellum and left frontal lobe     - Nodding yes/no to questions, following commands (thumbs up, toe wiggle); however, not speaking  Right eye blindness, optic retinitis/neurititis     - Mainly concerned between syphilis and CMV (butCMV PCR negative in the CNS)     - Appreciate ophthalmology      - Received steroid injection 08/02/2019     - Repeat MRIbrain and orbits show improvement of brain lesions and optic neurititis, retinitis  Severehyponatremia     - Likely 2/2severeSIADH     - Transferred to ICU for 3% saline and IV lasix     - Nephrology consulted; signed off 11/20      - current regimen: lasix, salt tab, Declomycin      - Na+ 137 this AM; resolved     - his SCr is off his baseline, will decrease lasix and stop demeclocycline as Na+ is normalized  AKI     - SCr is 1.63 this AM, BUN 69. This is above his baseline.     - HypoNa+ is resolved; could likely tolerate decreasing lasix; will move to 40mg  BID and monitor.    Untreated HIV/Advanced HIV-AIDS Leukopenia Thrush     - s/p fluconazole; now w/ magic mouthwash     - Appreciate ID assistance  Dysphagia Severe protein calorie malnutrition FTT     - eval'd SLP     - TFs managed by dietitian     -  Seen by PC: pt is FULL code  Severe prot-cal malnutrition     - Nutrition consult  DVT prophylaxis: lovenos Code Status: FULL   Disposition Plan: To SNF when able.  Consultants:   ID  [facility]  Ophthalmology  Nephrology  Antimicrobials:  . pyrimethamin-leucovorin . sulfadiazine   Subjective: No acute events ON per nursing.   Objective: Vitals:   08/18/19 1200 08/18/19 1600 08/18/19 2019 08/19/19 0428  BP: 115/86 105/75 101/82 117/83  Pulse: (!) 109 98 (!) 103 (!) 102  Resp: 20 20 (!) 28 (!) 23  Temp: 98 F (36.7 C) 97.8 F (36.6 C) 97.7 F  (36.5 C) 97.7 F (36.5 C)  TempSrc: Axillary Axillary Oral Oral  SpO2: 95% 96% 96% 99%  Weight:      Height:        Intake/Output Summary (Last 24 hours) at 08/19/2019 0736 Last data filed at 08/18/2019 1800 Gross per 24 hour  Intake 30 ml  Output 1150 ml  Net -1120 ml   Filed Weights   08/15/19 0304 08/16/19 0515 08/18/19 0500  Weight: 52.9 kg 51.5 kg 47.2 kg    Examination:  General: 38 y.o. ill appearing male resting in bed in NAD Cardiovascular: RRR, +S1, S2, no m/g/r, equal pulses throughout Respiratory: CTABL, no w/r/r, normal WOB GI: BS+, NDNT, no masses noted, no organomegaly noted MSK: No e/c/c Neuro: follows commands, not speaking Psyc: calm/cooperative   Data Reviewed: I have personally reviewed following labs and imaging studies.  CBC: Recent Labs  Lab 08/12/19 1020 08/13/19 0932 08/14/19 0230 08/15/19 0754 08/16/19 0441 08/18/19 0217  WBC 2.6* 2.1* 2.2* 2.1* 2.2* 3.2*  NEUTROABS 1.5* 1.1*  --   --   --   --   HGB 14.5 13.9 14.8 15.5 14.9 14.7  HCT 40.9 38.9* 41.2 43.9 43.0 43.3  MCV 84.9 84.6 83.6 85.4 86.2 88.2  PLT 334 323 337 303 339 350   Basic Metabolic Panel: Recent Labs  Lab 08/15/19 0754  08/16/19 0441  08/17/19 0213  08/18/19 0217 08/18/19 0807 08/18/19 1353 08/18/19 2042 08/19/19 0215  NA 127*   < > 129*   < > 130*   < > 132* 133* 136 137 137  K 5.2*  --  5.0  --  5.0  --  4.8  --   --   --  4.3  CL 92*  --  91*  --  91*  --  92*  --   --   --  99  CO2 25  --  27  --  26  --  28  --   --   --  27  GLUCOSE 131*  --  151*  --  148*  --  149*  --   --   --  152*  BUN 29*  --  40*  --  53*  --  67*  --   --   --  69*  CREATININE 1.15  --  1.18  --  1.56*  --  1.69*  --   --   --  1.63*  CALCIUM 9.0  --  9.4  --  9.5  --  9.6  --   --   --  9.5   < > = values in this interval not displayed.   GFR: Estimated Creatinine Clearance: 41.8 mL/min (A) (by C-G formula based on SCr of 1.63 mg/dL (H)). Liver Function Tests: Recent Labs   Lab 08/12/19 1020 08/13/19 0932  AST 25  29  ALT 18 18  ALKPHOS 45 48  BILITOT 0.3 0.4  PROT 8.7* 8.4*  ALBUMIN 3.1* 2.9*   No results for input(s): LIPASE, AMYLASE in the last 168 hours. No results for input(s): AMMONIA in the last 168 hours. Coagulation Profile: No results for input(s): INR, PROTIME in the last 168 hours. Cardiac Enzymes: No results for input(s): CKTOTAL, CKMB, CKMBINDEX, TROPONINI in the last 168 hours. BNP (last 3 results) No results for input(s): PROBNP in the last 8760 hours. HbA1C: No results for input(s): HGBA1C in the last 72 hours. CBG: Recent Labs  Lab 08/18/19 1156 08/18/19 1639 08/18/19 2017 08/18/19 2334 08/19/19 0426  GLUCAP 131* 157* 137* 139* 109*   Lipid Profile: No results for input(s): CHOL, HDL, LDLCALC, TRIG, CHOLHDL, LDLDIRECT in the last 72 hours. Thyroid Function Tests: No results for input(s): TSH, T4TOTAL, FREET4, T3FREE, THYROIDAB in the last 72 hours. Anemia Panel: No results for input(s): VITAMINB12, FOLATE, FERRITIN, TIBC, IRON, RETICCTPCT in the last 72 hours. Sepsis Labs: No results for input(s): PROCALCITON, LATICACIDVEN in the last 168 hours.  Recent Results (from the past 240 hour(s))  MRSA PCR Screening     Status: None   Collection Time: 08/10/19  1:35 PM   Specimen: Nasopharyngeal  Result Value Ref Range Status   MRSA by PCR NEGATIVE NEGATIVE Final    Comment:        The GeneXpert MRSA Assay (FDA approved for NASAL specimens only), is one component of a comprehensive MRSA colonization surveillance program. It is not intended to diagnose MRSA infection nor to guide or monitor treatment for MRSA infections. Performed at [facility], [place] [address] [place], [place] [address]       Radiology Studies: Ct Head Wo Contrast  Result Date: 08/17/2019 CLINICAL DATA:  Weakness.  Encephalopathy. EXAM: CT HEAD WITHOUT CONTRAST TECHNIQUE: Contiguous axial images were obtained from the base of the skull  through the vertex without intravenous contrast. COMPARISON:  Head CT 20 days ago.  Interval brain MRI FINDINGS: Brain: High-density areas within the bilateral cerebellum have resolved. In place are newly seen low-density areas with patchy distribution on both sides. The extent is similar to signal abnormality in the bilateral cerebellum on interval brain MRI. At the site of a large anterior subcortical left frontal lesion there is new high density which could be petechial hemorrhage or mineralization. Suspect there is decreased mass effect at this level. Age advanced brain atrophy in the setting of HIV. No hematoma, hydrocephalus, or collection. Vascular: Negative Skull: Negative Sinuses/Orbits: Negative IMPRESSION: 1. No new process. 2. Evolving lesions of the cerebellum and left frontal lobe, extent of disease is underestimated relative to prior MRI. A Left frontal lesion is now mildly high density which could be from mineralization or petechial hemorrhage. 3. Atrophy. Electronically Signed   By: Monte Fantasia M.D.   On: 08/17/2019 12:14   Dg Chest Port 1 View  Result Date: 08/18/2019 CLINICAL DATA:  Aspiration EXAM: PORTABLE CHEST 1 VIEW COMPARISON:  08/09/2019 chest radiograph. FINDINGS: Enteric tube enters stomach with the tip not seen on this image. stable cardiomediastinal silhouette with normal heart size. No pneumothorax. No pleural effusion. Lungs appear clear, with no acute consolidative airspace disease and no pulmonary edema. IMPRESSION: 1. Enteric tube enters stomach with the tip not seen on this image. 2. No active cardiopulmonary disease. Electronically Signed   By: Ilona Sorrel M.D.   On: 08/18/2019 10:26     Scheduled Meds: . Chlorhexidine Gluconate Cloth  6 each Topical Daily  . demeclocycline  150 mg Per Tube Q12H  . enoxaparin (LOVENOX) injection  40 mg Subcutaneous Q24H  . famotidine  20 mg Per Tube Daily  . feeding supplement (OSMOLITE 1.5 CAL)  1,000 mL Per Tube Q24H  .  feeding supplement (PRO-STAT SUGAR FREE 64)  30 mL Per Tube TID  . furosemide  80 mg Per Tube BID  . insulin aspart  0-9 Units Subcutaneous Q4H  . magic mouthwash w/lidocaine  10 mL Oral TID AC & HS  . mouth rinse  15 mL Mouth Rinse BID  . multivitamin with minerals  1 tablet Per Tube Daily  . Pyrimethamine-Leucovorin  50 mg Oral Daily  . sodium chloride  2 g Per Tube TID WC  . sulfaDIAZINE  1,000 mg Oral Q6H   Continuous Infusions: . chlorproMAZINE (THORAZINE) IV 12.5 mg (08/09/19 1135)     LOS: 21 days    Time spent: 35 minutes spent in the coordination of care today.    Teddy Spike, DO [facility] Pager [phone]  If 7PM-7AM, please contact night-coverage [email or web address] Password [facility] 08/19/2019, 7:36 AM

## 2019-08-19 NOTE — Progress Notes (Signed)
Pt resting in chair, unable to do CPT at this time but RT told RN to call when he is being moved back to bed that way CPT can be done.

## 2019-08-19 NOTE — Progress Notes (Signed)
Nutrition Follow-up  DOCUMENTATION CODES:   Severe malnutrition in context of chronic illness, Underweight  INTERVENTION:   Tube Feeding: Increase Osmolite 1.5 to 60 ml/hr Pro-Stat 30 mL Daily Provides 2260 kcals, 105 g of protein and 1094 mL of fluid  Meets 100% estimated protein and calorie needs  NUTRITION DIAGNOSIS:   Severe Malnutrition related to chronic illness as evidenced by severe muscle depletion, severe fat depletion.  Being addressed via TF   GOAL:   Patient will meet greater than or equal to 90% of their needs  Met  MONITOR:   TF tolerance, Diet advancement, Labs, Weight trends  REASON FOR ASSESSMENT:   Consult Enteral/tube feeding initiation and management  ASSESSMENT:   38 y.o. male with medical history significant of untreated HIV, anemia, depression, GERD, substance abuse  Admitted for intracerebral opportunistic infection   Pt lethargic, opens eyes upon exam today. Pt tolerating Osmolite 1.5 at 50 ml/hr, Pro-Stat 30 mL BID via Cortrak tube  SLP following, pt remains inappropriate for diet advancement. Given severe malnutrition and inability to advance diet, recommend considering long term feeding tube placement   Hyponatremia has resolved  Current wt 47.2 kg, BMI 14.52. Admit weight 57 kg.  Given potential weight loss and underweight status, plan to adjust TF accordingly  Labs: Creatinine 1.63, BUN 60, sodium 137-138 (wdl) Meds: lasix, ss novolog  NUTRITION - FOCUSED PHYSICAL EXAM:    Most Recent Value  Orbital Region  Severe depletion  Upper Arm Region  Severe depletion  Thoracic and Lumbar Region  Severe depletion  Buccal Region  Severe depletion  Temple Region  Severe depletion  Clavicle Bone Region  Severe depletion  Clavicle and Acromion Bone Region  Severe depletion  Scapular Bone Region  Severe depletion  Dorsal Hand  Moderate depletion  Patellar Region  Severe depletion  Anterior Thigh Region  Severe depletion  Posterior Calf  Region  Severe depletion  Edema (RD Assessment)  None       Diet Order:   Diet Order            Diet NPO time specified  Diet effective now              EDUCATION NEEDS:   Not appropriate for education at this time  Skin:  Skin Assessment: Reviewed RN Assessment  Last BM:  11/23  Height:   Ht Readings from Last 1 Encounters:  07/29/19 5' 11"  (1.803 m)    Weight:   Wt Readings from Last 1 Encounters:  08/18/19 47.2 kg    Ideal Body Weight:  78.1 kg  BMI:  Body mass index is 14.51 kg/m.  Estimated Nutritional Needs:   Kcal:  2000-2200  Protein:  100-115g  Fluid:  Fluid Restricted   Kerman Passey MS, RDN, LDN, CNSC [phone] Pager  [phone] Weekend/On-Call Pager

## 2019-08-19 NOTE — Progress Notes (Signed)
SLP Cancellation Note  Patient Details Name: Arthur [name] MRN: [id number] DOB: 19-Feb-1981   Cancelled treatment:       Reason Eval/Treat Not Completed: Medical issues which prohibited therapy. Per RN, pt not appropriate to try POs this morning. Will continue to follow. Given slow progress over the last two weeks, may need to start considering longer-term sources for alternative nutrition.    Venita Sheffield Dorine Duffey 08/19/2019, 8:51 AM  Pollyann Glen, M.A. [place] [facility] Environmental education officer [phone] Office [phone]

## 2019-08-19 NOTE — Progress Notes (Signed)
Patient ID: Arthur [name], male   DOB: Mar 15, 1981, 38 y.o.   MRN: [id number]  This NP visited patient at the bedside as a follow up for palliative medicine needs and emotional support.  This nurse practitioner reviewed medical records, received report from team and then assessed the patient.  I then met at the patient's bedside along with his mother to  introduce myself as a member of the palliative medicine team who will be following this week.  Continued conversation had regarding current medical situation.  Created space and opportunity for patient's mother to explore his thoughts and feelings regarding current medical situation. Patient's mother was able to verbalize her understanding of the seriousness of her son's current medical situation.  I was able to engage the patient in conversation regarding the importance of his participation in his day-to-day treatment plan specific to mobility.  Demonstrated with patient side to side head turns and upper extremity movements.  Strongly encouraged him to cough and clear secretions  Patient and family is open to all offered and available medical interventions to prolong life, they are hopeful for improvement.  Discussed with mother  the importance of continued conversation with her family and the  medical providers regarding overall plan of care and treatment options,  ensuring decisions are within the context of the patients values and GOCs.  Questions and concerns addressed   Discussed with bedside RN  Total time spent on the unit was 35 minutes  Greater than 50% of the time was spent in counseling and coordination of care  Wadie Lessen NP  Palliative Medicine Team Team Phone # [phone] Pager [phone]

## 2019-08-19 NOTE — Progress Notes (Signed)
Physical Therapy Treatment Patient Details Name: Arthur [name] MRN: [id number] DOB: 29-Jul-1981 Today's Date: 08/19/2019    History of Present Illness Arthur [name] is a 38 y.o. male with medical history significant of untreated HIV, anemia, depression, GERD, substance abuse presenting with c/o dizziness, headache and weakness.  He was admitted with hyponatremia, bradycardia and late syphilis.    PT Comments    Patient seen for mobility progression. Pt agreeable to participate but not as interactive this session vs previous session. Stedy standing frame required for functional transfer training. Pt requires max A +2 for bed mobility, mod A +2 for sit to stands with standing frame, and max A for sitting/standing balance due to posterior and R lateral bias. Pt responds with yes/no head nods and non verbal during session. Continue to progress as tolerated with anticipated d/c to SNF for further skilled PT services.     Follow Up Recommendations  SNF;Supervision/Assistance - 24 hour     Equipment Recommendations  Other (comment)(To be determined at next venue)    Recommendations for Other Services       Precautions / Restrictions Precautions Precautions: Fall Restrictions Weight Bearing Restrictions: No    Mobility  Bed Mobility Overal bed mobility: Needs Assistance Bed Mobility: Supine to Sit     Supine to sit: Max assist;+2 for physical assistance;+2 for safety/equipment Sit to supine: Total assist;+2 for physical assistance   General bed mobility comments: pt on air mattress; +2 assist required for safe transition supine to sit; while sitting EOB pt crossing legs or extending bilat LE despite cues to keep feet on the ground; pt began sliding toward EOB and therapist and therapy tech returned pt to supine; pt sat up again with all air out of matress and bilat LE blocked  Transfers Overall transfer level: Needs assistance   Transfers: Sit to/from Stand Sit to Stand: Mod  assist;+2 physical assistance;+2 safety/equipment         General transfer comment: Stedy standing frame utilized for safety; pt able to stand X 2 trials with +2 assist  Ambulation/Gait                 Stairs             Wheelchair Mobility    Modified Rankin (Stroke Patients Only)       Balance Overall balance assessment: Needs assistance Sitting-balance support: Feet supported Sitting balance-Leahy Scale: Zero Sitting balance - Comments: max A for sitting balance EOB due to posterior and R lateral bias   Standing balance support: Bilateral upper extremity supported;During functional activity Standing balance-Leahy Scale: Zero Standing balance comment: pt with R lateral bias in standing                             Cognition Arousal/Alertness: Awake/alert Behavior During Therapy: Flat affect Overall Cognitive Status: Difficult to assess Area of Impairment: Problem solving;Safety/judgement;Following commands                       Following Commands: Follows one step commands inconsistently;Follows one step commands with increased time Safety/Judgement: Decreased awareness of deficits;Decreased awareness of safety Awareness: Intellectual Problem Solving: Slow processing;Requires verbal cues;Requires tactile cues;Decreased initiation;Difficulty sequencing General Comments: pt is nonverbal during session and nods head yes/no to questions; pt is less interactive during session today vs previous session when sister was present      Exercises      General Comments  General comments (skin integrity, edema, etc.): pt is unable to hold head up; has a neck pillow in room      Pertinent Vitals/Pain Pain Assessment: No/denies pain    Home Living                      Prior Function            PT Goals (current goals can now be found in the care plan section) Progress towards PT goals: Progressing toward goals     Frequency    Min 2X/week      PT Plan Current plan remains appropriate    Co-evaluation              AM-PAC PT "6 Clicks" Mobility   Outcome Measure  Help needed turning from your back to your side while in a flat bed without using bedrails?: A Lot Help needed moving from lying on your back to sitting on the side of a flat bed without using bedrails?: A Lot Help needed moving to and from a bed to a chair (including a wheelchair)?: A Lot Help needed standing up from a chair using your arms (e.g., wheelchair or bedside chair)?: A Lot Help needed to walk in hospital room?: Total Help needed climbing 3-5 steps with a railing? : Total 6 Click Score: 10    End of Session Equipment Utilized During Treatment: Gait belt Activity Tolerance: Patient tolerated treatment well Patient left: with call bell/phone within reach;in chair;with chair alarm set Nurse Communication: Mobility status PT Visit Diagnosis: Other abnormalities of gait and mobility (R26.89);Muscle weakness (generalized) (M62.81)     Time: 2119-4174 PT Time Calculation (min) (ACUTE ONLY): 33 min  Charges:  $Gait Training: 8-22 mins $Therapeutic Activity: 8-22 mins                     Earney Navy, PTA Acute Rehabilitation Services Pager: [phone] Office: [phone]     Darliss Cheney 08/19/2019, 5:18 PM

## 2019-08-19 NOTE — Progress Notes (Signed)
[facility]    Date of Admission:  07/28/2019   Total days of antibiotics 18           ID: Arthur [name] is a 38 y.o. 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 Leucopenia persists. Nods appropriately to questions. Difficult to engage.   Medications:  . enoxaparin (LOVENOX) injection  40 mg Subcutaneous Q24H  . famotidine  20 mg Per Tube Daily  . [START ON 08/20/2019] feeding supplement (PRO-STAT SUGAR FREE 64)  30 mL Per Tube Daily  . furosemide  40 mg Per Tube BID  . insulin aspart  0-9 Units Subcutaneous Q4H  . magic mouthwash w/lidocaine  10 mL Oral TID AC & HS  . mouth rinse  15 mL Mouth Rinse BID  . mirtazapine  15 mg Oral QHS  . multivitamin with minerals  1 tablet Per Tube Daily  . Pyrimethamine-Leucovorin  50 mg Oral Daily  . sodium chloride  2 g Per Tube TID WC  . sulfaDIAZINE  1,000 mg Oral Q6H  . sulfamethoxazole-trimethoprim  1 tablet Oral 3 times weekly    Objective: Vital signs in last 24 hours: Temp:  [97.7 F (36.5 C)-98.5 F (36.9 C)] 98.5 F (36.9 C) (11/23 1200) Pulse Rate:  [92-103] 97 (11/23 1200) Resp:  [20-28] 22 (11/23 1200) BP: (101-124)/(75-89) 124/89 (11/23 1200) SpO2:  [95 %-99 %] 95 % (11/23 0831)  hysical Exam  Constitutional: He is oriented to person, only. He appears cachetic and severely mal-nourished. No distress. He does nod appropriately to questions but does not verbalize.  HENT: disconjugate gaze Mouth/Throat: white patchy coating over portions of tongue. Cheeks seem spared.  Cardiovascular: Normal  rate, regular rhythm and normal heart sounds. Exam reveals no gallop and no friction rub. No murmur heard.  Pulmonary/Chest: Audible upper airway rhonchi that clear with cough/flutter valve and repositioning. Effort normal. No respiratory distress. He has no wheezes.  Abdominal: Soft. scaphoid. He exhibits no distension. There is no tenderness.  Buttock: no skin breakdown Lymphadenopathy: He has no cervical adenopathy.  Neurological: He is alert and moves all extremities. Slowed response to questions. Unable to really gauge vision.  Skin: Skin is warm and dry. No rash noted. No erythema.  Ext: wasted extremities Psychiatric: flat   Lab Results Recent Labs    08/18/19 0217  08/19/19 0215 08/19/19 0901  WBC 3.2*  --   --   --   HGB 14.7  --   --   --   HCT 43.3  --   --   --   NA 132*   < > 137 138  K 4.8  --  4.3  --   CL 92*  --  99  --   CO2 28  --  27  --   BUN 67*  --  69*  --   CREATININE 1.69*  --  1.63*  --    < > = values in this interval not displayed.    Microbiology: reviewed Studies/Results: Dg Chest Port 1 View  Result Date: 08/18/2019 CLINICAL DATA:  Aspiration EXAM: PORTABLE CHEST 1 VIEW COMPARISON:  08/09/2019 chest radiograph. FINDINGS: Enteric tube enters stomach with the tip not seen on this image. stable cardiomediastinal silhouette with normal heart size. No pneumothorax. No pleural effusion. Lungs appear clear, with no acute consolidative airspace disease and no pulmonary edema. IMPRESSION: 1. Enteric tube enters stomach with the tip not seen on this image. 2. No active cardiopulmonary disease. Electronically Signed   By: Ilona Sorrel M.D.   On: 08/18/2019 10:26     Assessment/Plan: CNS toxo = continue with current regimen of pyrimethamine and sulfaziadine, treatment day 5. He will require at least six week induction treatment then consideration for secondary prophylaxis.   Decreased sensorium =  NCHCT reveals evolving lesions of the cerebellum and left  frontal lobe, extent to which may be underestimated; left frontal lesion now mildly high density that could be from mineralization vs hemorrhage --> will follow up with brain MRI to more definitively outline.   Severe protein caloric malnutrition = on tube feedings. He may need more durable option long term with consideration of PEG.   HIV Disease, +AIDS = treatment pending while getting toxo treatment; add Bactrim DS 3x/week for PJP prophylaxis.   Thrush = s/p 21 days fluconazole 200mg  QD. Still with coating over tongue that may be secondary to reflux of tube feedings vs ongoing thrush.  Stopping fluconazole for now and following.  May need to consider switch to Eraxis it worsens.   Hyponatremia = improved @ 138 today. Nephrology/primary managing.   Rhonchi = no fevers, not hypoxic. Encouraged flutter valve/pulmonary hygiene, sitting upright in bed, frequent turning.    Janene Madeira, MSN, NP-C [facility] Infectious [facility] [email or web address] Pager: [phone] Office: [phone] [place]: [phone]

## 2019-08-19 NOTE — Progress Notes (Signed)
Patient was just not able to do flutter efficiently, so I put patient on chest vest.  Did 6Hz  for 10 minutes to try and break up the mucus so the patient can cough it up.  Patient has a fairly strong cough but it is not very productive.

## 2019-08-20 DIAGNOSIS — R531 Weakness: Secondary | ICD-10-CM

## 2019-08-20 DIAGNOSIS — R1310 Dysphagia, unspecified: Secondary | ICD-10-CM

## 2019-08-20 LAB — CBC WITH DIFFERENTIAL/PLATELET
Abs Immature Granulocytes: 0.06 10*3/uL (ref 0.00–0.07)
Basophils Absolute: 0 10*3/uL (ref 0.0–0.1)
Basophils Relative: 0 %
Eosinophils Absolute: 0 10*3/uL (ref 0.0–0.5)
Eosinophils Relative: 0 %
HCT: 42.3 % (ref 39.0–52.0)
Hemoglobin: 14 g/dL (ref 13.0–17.0)
Immature Granulocytes: 2 %
Lymphocytes Relative: 19 %
Lymphs Abs: 0.6 10*3/uL — ABNORMAL LOW (ref 0.7–4.0)
MCH: 30 pg (ref 26.0–34.0)
MCHC: 33.1 g/dL (ref 30.0–36.0)
MCV: 90.8 fL (ref 80.0–100.0)
Monocytes Absolute: 0.5 10*3/uL (ref 0.1–1.0)
Monocytes Relative: 17 %
Neutro Abs: 1.9 10*3/uL (ref 1.7–7.7)
Neutrophils Relative %: 62 %
Platelets: 288 10*3/uL (ref 150–400)
RBC: 4.66 MIL/uL (ref 4.22–5.81)
RDW: 14.3 % (ref 11.5–15.5)
WBC: 3.1 10*3/uL — ABNORMAL LOW (ref 4.0–10.5)
nRBC: 0 % (ref 0.0–0.2)

## 2019-08-20 LAB — SODIUM: Sodium: 143 mmol/L (ref 135–145)

## 2019-08-20 LAB — COMPREHENSIVE METABOLIC PANEL
ALT: 26 U/L (ref 0–44)
AST: 39 U/L (ref 15–41)
Albumin: 3.2 g/dL — ABNORMAL LOW (ref 3.5–5.0)
Alkaline Phosphatase: 51 U/L (ref 38–126)
Anion gap: 9 (ref 5–15)
BUN: 58 mg/dL — ABNORMAL HIGH (ref 6–20)
CO2: 28 mmol/L (ref 22–32)
Calcium: 9.4 mg/dL (ref 8.9–10.3)
Chloride: 104 mmol/L (ref 98–111)
Creatinine, Ser: 1.5 mg/dL — ABNORMAL HIGH (ref 0.61–1.24)
GFR calc Af Amer: >60 mL/min (ref >60)
GFR calc non Af Amer: 59 mL/min — ABNORMAL LOW (ref >60)
Glucose, Bld: 153 mg/dL — ABNORMAL HIGH (ref 70–99)
Potassium: 4.4 mmol/L (ref 3.5–5.1)
Sodium: 141 mmol/L (ref 135–145)
Total Bilirubin: 0.6 mg/dL (ref 0.3–1.2)
Total Protein: 8.7 g/dL — ABNORMAL HIGH (ref 6.5–8.1)

## 2019-08-20 LAB — GLUCOSE, CAPILLARY
Glucose-Capillary: 103 mg/dL — ABNORMAL HIGH (ref 70–99)
Glucose-Capillary: 120 mg/dL — ABNORMAL HIGH (ref 70–99)
Glucose-Capillary: 136 mg/dL — ABNORMAL HIGH (ref 70–99)
Glucose-Capillary: 145 mg/dL — ABNORMAL HIGH (ref 70–99)
Glucose-Capillary: 146 mg/dL — ABNORMAL HIGH (ref 70–99)
Glucose-Capillary: 72 mg/dL (ref 70–99)

## 2019-08-20 LAB — MAGNESIUM: Magnesium: 2.8 mg/dL — ABNORMAL HIGH (ref 1.7–2.4)

## 2019-08-20 MED ORDER — MIRTAZAPINE 7.5 MG PO TABS
15.0000 mg | ORAL_TABLET | Freq: Every day | ORAL | Status: DC
  Administered 2019-08-20 – 2019-08-28 (×9): 15 mg
  Filled 2019-08-20 (×9): qty 2

## 2019-08-20 MED ORDER — SULFADIAZINE 500 MG PO TABS
1000.0000 mg | ORAL_TABLET | Freq: Four times a day (QID) | ORAL | Status: DC
  Administered 2019-08-20 – 2019-09-07 (×63): 1000 mg
  Filled 2019-08-20 (×77): qty 2

## 2019-08-20 NOTE — Progress Notes (Addendum)
[name]  PROGRESS NOTE    Arthur [name]  ZOX:[id number] DOB: 23-Sep-1981 DOA: 07/28/2019 PCP: Randall Hiss, MD   Brief Narrative:   Arthur [name] a 38 year old male with past medical history significant for HIV, substance abuse, depression and GERD who presented to the emergency department on 11/1 with chief complaint of dizziness, weakness and intermittent headache. He has not taken any of his HIV medications for about 1 year due to affordability issue. MRI brain done in ED showed widespread areas of abnormal low level restricted diffusion and postcontrast enhancement throughout both cerebral hemispheres, predominantly infratentorial, with the dominant abnormality in the left anterior frontal white matter consistent with an opportunistic infection, such as toxoplasmosis, tuberculosis or fungal cerebritis/meningitis, or parasitic infection. Patient was admitted to hospital service and ID was consulted. He then revealed that he has lost his vision in his right eye. ID started him on penicillin and ganciclovir for possible CMV retinitis versus synovitis retinitis and ophthalmology consulted.  11/23: No acute events ON per nursing.  11/24: Ok ON. Spoke with mother about PEG placement. She is unsure of proceeding with that at this time. She states that the pt is talking to her, but not to [place]. She wants to see him again today before making a decision.    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 RIPEtx stopped     - Appreciate ID. Completed PCN for 14 days. Additional IM bicillin on 11/25      - current regimen: pyrimethamin-leucovorin, sulfadiazine     - 11/24: vitals ok; remains afebrile; continue as above  Acute toxic metabolic encephalopathy, multifactorial including CNS infection and SIADH     - Repeat CT head without new process, evolving lesions of cerebellum and left frontal lobe     - Nodding yes/no to questions, following commands (thumbs up, toe wiggle); however, not speaking     - 11/24: mother states that he is speaking to her when she sees him, but not to [place]; still follows commands  Right eye blindness, optic retinitis/neurititis     - Mainly concerned between syphilis and CMV (butCMV PCR negative in the CNS)     - Appreciate ophthalmology      - Received steroid injection 08/02/2019     - Repeat MRIbrain and orbits show improvement of brain lesions and optic neurititis, retinitis  Severehyponatremia     - Likely 2/2severeSIADH     - Transferred to ICU for 3% saline and IV lasix     - Nephrology consulted; signed off 11/20      - current regimen: lasix, salt tab, Declomycin      - Na+ 137 this AM; resolved     - his SCr is off his baseline, will decrease lasix and stop demeclocycline as Na+ is normalized     - 11/24: Na+ is 141 this AM; he is doing well, let's  hold lasix and continue salt tabs alone  AKI     - SCr is 1.63 this AM, BUN 69. This is above his baseline.     - HypoNa+ is resolved; could likely tolerate decreasing lasix; will move to 40mg  BID and monitor     - 11/24: SCr is down to 1.50. Let's stop lasix. Monitor    Untreated HIV/Advanced HIV-AIDS Leukopenia Thrush     - s/p fluconazole; now w/ magic mouthwash     - Appreciate ID assistance  Dysphagia Severe protein calorie malnutrition FTT     - eval'd SLP     - TFs managed by dietitian     -  Seen by PC: pt is FULL code     - now on remeron, monitor  DVT prophylaxis: lovenox Code Status: FULL Family Communication: spoke with mother by phone   Disposition Plan: TBD  Consultants:    ID  [facility]  Ophthalmology  Nephrology  Subjective: Doing ok ON. No acute events per nursing.   Objective: Vitals:   08/20/19 0300 08/20/19 0720 08/20/19 0800 08/20/19 0825  BP: 109/71  110/85   Pulse: (!) 107 (!) 106 (!) 53 93  Resp: (!) 24 (!) 25 (!) 25 19  Temp: 98.8 F (37.1 C)  98.6 F (37 C)   TempSrc: Oral  Oral   SpO2: 98% 95% 97% 99%  Weight: 50.9 kg     Height:        Intake/Output Summary (Last 24 hours) at 08/20/2019 1018 Last data filed at 08/20/2019 0900 Gross per 24 hour  Intake 3165 ml  Output 650 ml  Net 2515 ml   Filed Weights   08/16/19 0515 08/18/19 0500 08/20/19 0300  Weight: 51.5 kg 47.2 kg 50.9 kg    Examination:  General: 38 y.o. male resting in bed in NAD Cardiovascular: RRR, +S1, S2, no m/g/r Respiratory: CTABL, no w/r/r GI: BS+, NDNT, no masses noted, NGT in place MSK: No e/c/c Neuro: alert and following simple commands, not talking however  Data Reviewed: I have personally reviewed following labs and imaging studies.  CBC: Recent Labs  Lab 08/14/19 0230 08/15/19 0754 08/16/19 0441 08/18/19 0217 08/20/19 0249  WBC 2.2* 2.1* 2.2* 3.2* 3.1*  NEUTROABS  --   --   --   --  1.9  HGB 14.8 15.5 14.9 14.7 14.0  HCT 41.2 43.9 43.0 43.3 42.3  MCV 83.6 85.4 86.2 88.2 90.8  PLT 337 303 339 350 102   Basic Metabolic Panel: Recent Labs  Lab 08/16/19 0441  08/17/19 0213  08/18/19 0217  08/19/19 0215 08/19/19 0901 08/19/19 1501 08/19/19 1953 08/20/19 0249  NA 129*   < > 130*   < > 132*   < > 137 138 138 139 141  K 5.0  --  5.0  --  4.8  --  4.3  --   --   --  4.4  CL 91*  --  91*  --  92*  --  99  --   --   --  104  CO2 27  --  26  --  28  --  27  --   --   --  28  GLUCOSE 151*  --  148*  --  149*  --  152*  --   --   --  153*  BUN  40*  --  53*  --  67*  --  69*  --   --   --  58*  CREATININE 1.18  --  1.56*  --  1.69*  --  1.63*  --   --   --  1.50*  CALCIUM 9.4  --  9.5  --  9.6  --  9.5  --   --   --  9.4  MG  --   --   --   --   --   --   --   --   --   --  2.8*   < > = values in this interval not displayed.   GFR: Estimated Creatinine Clearance: 49 mL/min (A) (by C-G formula based on SCr of 1.5 mg/dL (H)). Liver Function Tests: Recent Labs  Lab 08/20/19 0249  AST 39  ALT 26  ALKPHOS 51  BILITOT 0.6  PROT 8.7*  ALBUMIN 3.2*   No results for input(s): LIPASE, AMYLASE in the last 168 hours. No results for input(s): AMMONIA in the last 168 hours. Coagulation Profile: No results for input(s): INR, PROTIME in the last 168 hours. Cardiac Enzymes: No results for input(s): CKTOTAL, CKMB, CKMBINDEX, TROPONINI in the last 168 hours. BNP (last 3 results) No results for input(s): PROBNP in the last 8760 hours. HbA1C: No results for input(s): HGBA1C in the last 72 hours. CBG: Recent Labs  Lab 08/19/19 1703 08/19/19 1920 08/19/19 2327 08/20/19 0425 08/20/19 0803  GLUCAP 165* 125* 127* 146* 103*   Lipid Profile: No results for input(s): CHOL, HDL, LDLCALC, TRIG, CHOLHDL, LDLDIRECT in the last 72 hours. Thyroid Function Tests: No results for input(s): TSH, T4TOTAL, FREET4, T3FREE, THYROIDAB in the last 72 hours. Anemia Panel: No results for input(s): VITAMINB12, FOLATE, FERRITIN, TIBC, IRON, RETICCTPCT in the last 72 hours. Sepsis Labs: No results for input(s): PROCALCITON, LATICACIDVEN in the last 168 hours.  Recent Results (from the past 240 hour(s))  MRSA PCR Screening     Status: None   Collection Time: 08/10/19  1:35 PM   Specimen: Nasopharyngeal  Result Value Ref Range Status   MRSA by PCR NEGATIVE NEGATIVE Final    Comment:        The GeneXpert MRSA Assay (FDA approved for NASAL specimens only), is one component of a comprehensive MRSA colonization surveillance program. It is not intended  to diagnose MRSA infection nor to guide or monitor treatment for MRSA infections. Performed at [facility], [address] [place], [place] [address]       Radiology Studies: No results found.   Scheduled Meds: . enoxaparin (LOVENOX) injection  40 mg Subcutaneous Q24H  . famotidine  20 mg Per Tube Daily  . feeding supplement (PRO-STAT SUGAR FREE 64)  30 mL Per Tube Daily  . furosemide  40 mg Per Tube BID  . insulin aspart  0-9 Units Subcutaneous Q4H  . magic mouthwash w/lidocaine  10 mL Oral TID AC & HS  . mouth rinse  15 mL Mouth Rinse BID  . mirtazapine  15 mg Oral QHS  . multivitamin with minerals  1 tablet Per Tube Daily  . Pyrimethamine-Leucovorin  50 mg Oral Daily  . sodium chloride  2 g Per Tube TID WC  . sulfaDIAZINE  1,000 mg Oral Q6H  . sulfamethoxazole-trimethoprim  1 tablet Oral 3 times weekly   Continuous Infusions: . chlorproMAZINE (THORAZINE) IV 12.5 mg (08/09/19 1135)  . feeding supplement (OSMOLITE 1.5 CAL) 60 mL/hr at 08/20/19 0800     LOS: 22 days    Time spent: 35 minutes spent in the coordination of care today.  Teddy Spike, DO [facility] Pager [phone]  If 7PM-7AM, please contact night-coverage [email or web address] Password [email or web address] 08/20/2019, 10:18 AM

## 2019-08-20 NOTE — Progress Notes (Signed)
  Speech Language Pathology Treatment: Dysphagia  Patient Details Name: DEAGAN SEVIN MRN: [id number] DOB: 1980-12-19 Today's Date: 08/20/2019 Time: 3016-0109 SLP Time Calculation (min) (ACUTE ONLY): 12 min  Assessment / Plan / Recommendation Clinical Impression  Pt seems less engaged in PO trials today, making minimal attempts at oral opening and showing no initiation of oral preparation or transit. Small amounts of water spill entirely from his mouth and ice chips just sit anteriorly until removed by SLP. No swallowing noted today. He sounds increasingly congested and does cough at times to command but it is too weak to clear anything orally. Recommend continuing NPO with consideration for longer-term alternative means of nutrition.    HPI HPI: Pt is a 38 y.o. male admitted with neurosyphillis, CNS toxoplasmosis. MRI 11/1 showed widespread areas concerning for infection, with most dominant abnormality in the L anterior frontal white matter. Hospital course was complicated by hyponatremia and respiratory decline, transferred to ICU on 11/14 but not requiring intubation. Multiple CXRs this admission without acute findings. PMH: substance abuse, HIV, GERD, depression, anxiety, anemia      SLP Plan  Continue with current plan of care       Recommendations  Diet recommendations: NPO                Oral Care Recommendations: Oral care QID Follow up Recommendations: 24 hour supervision/assistance;Skilled Nursing facility SLP Visit Diagnosis: Dysphagia, oropharyngeal phase (R13.12) Plan: Continue with current plan of care       GO                Venita Sheffield Tanya Crothers 08/20/2019, 10:01 AM  Pollyann Glen, M.A. [place] Acute Environmental education officer [phone] Office [phone]

## 2019-08-20 NOTE — Progress Notes (Signed)
Pt is comfortably sleeping. Will attempt vest CPT at a later time.

## 2019-08-21 ENCOUNTER — Inpatient Hospital Stay (HOSPITAL_COMMUNITY): Payer: Medicaid Other

## 2019-08-21 LAB — GLUCOSE, CAPILLARY
Glucose-Capillary: 105 mg/dL — ABNORMAL HIGH (ref 70–99)
Glucose-Capillary: 109 mg/dL — ABNORMAL HIGH (ref 70–99)
Glucose-Capillary: 125 mg/dL — ABNORMAL HIGH (ref 70–99)
Glucose-Capillary: 128 mg/dL — ABNORMAL HIGH (ref 70–99)
Glucose-Capillary: 151 mg/dL — ABNORMAL HIGH (ref 70–99)
Glucose-Capillary: 87 mg/dL (ref 70–99)

## 2019-08-21 LAB — BASIC METABOLIC PANEL
Anion gap: 9 (ref 5–15)
BUN: 55 mg/dL — ABNORMAL HIGH (ref 6–20)
CO2: 25 mmol/L (ref 22–32)
Calcium: 9.2 mg/dL (ref 8.9–10.3)
Chloride: 110 mmol/L (ref 98–111)
Creatinine, Ser: 1.4 mg/dL — ABNORMAL HIGH (ref 0.61–1.24)
GFR calc Af Amer: >60 mL/min (ref >60)
GFR calc non Af Amer: >60 mL/min (ref >60)
Glucose, Bld: 149 mg/dL — ABNORMAL HIGH (ref 70–99)
Potassium: 3.7 mmol/L (ref 3.5–5.1)
Sodium: 144 mmol/L (ref 135–145)

## 2019-08-21 IMAGING — MR MR HEAD WO/W CM
10 of 13 series · 30 of 48 positions shown · IV contrast (gadavist)
Comparison: None.

CLINICAL DATA: Dizziness, weakness and headache. History of HIV.

EXAM:
MRI HEAD WITHOUT AND WITH CONTRAST
TECHNIQUE: Multiplanar, multiecho pulse sequences of the brain and surrounding
structures were obtained without and with intravenous contrast.
CONTRAST:  4.5mL GADAVIST GADOBUTROL 1 MMOL/ML IV SOLN

[Series 2: DWI · axial · 3.0mm · 0.94mm/px · z∈[-118,+45]mm · 6 of 114 slices shown (1 of 2)]
[im 1/114]
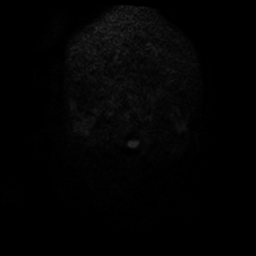
[im 23/114]
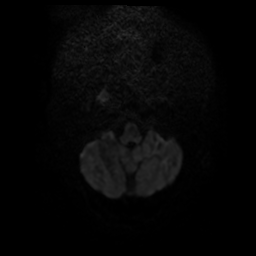
[im 46/114]
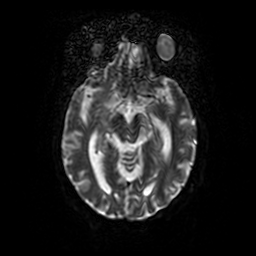
[im 68/114]
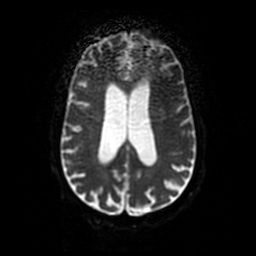
[im 91/114]
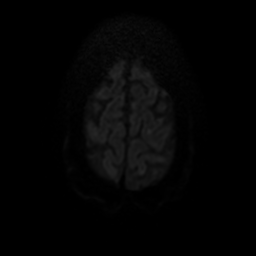
[im 114/114]
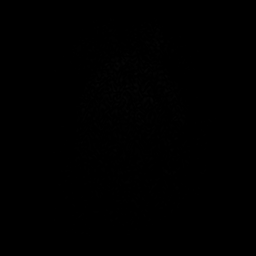

[Series 3: DWI · coronal · 4.0mm · 0.94mm/px · 4 of 76 slices shown (2 of 2)]
[im 1/76]
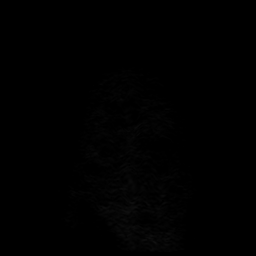
[im 26/76]
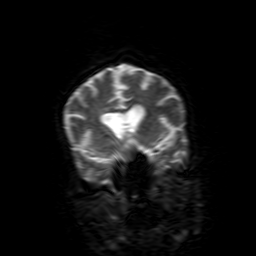
[im 51/76]
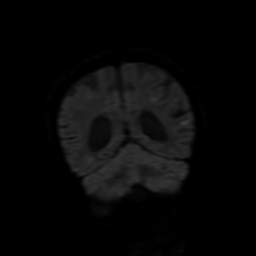
[im 76/76]
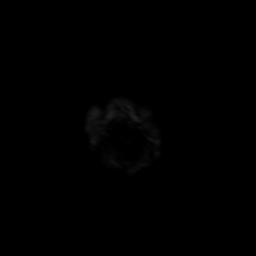

[Series 4: FLAIR · sagittal · 5.0mm · 0.47mm/px · 2 of 25 slices shown (1 of 2)]
[im 1/25]
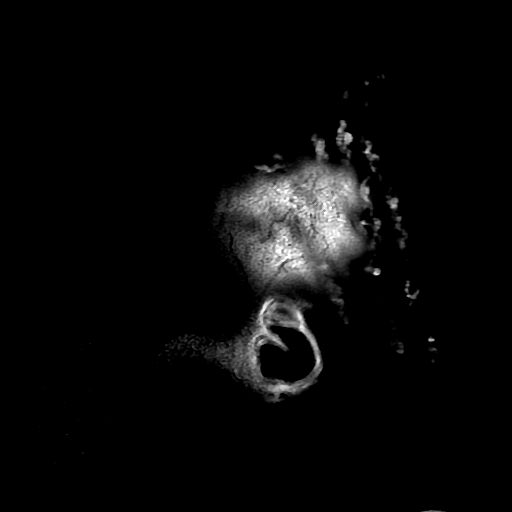
[im 25/25]
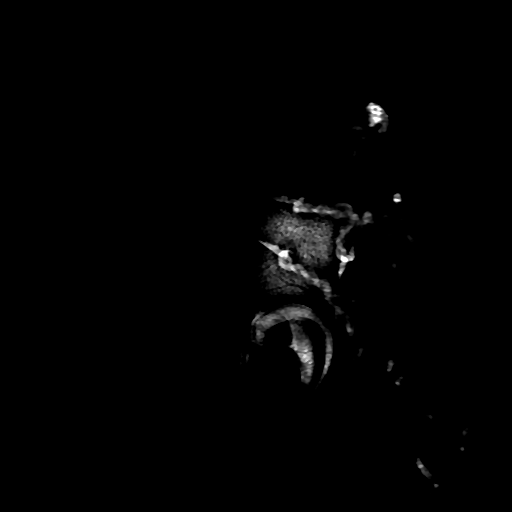

[Series 5: T2 · axial · 5.0mm · 0.47mm/px · z∈[-119,+35]mm · 2 of 28 slices shown]
[im 1/28]
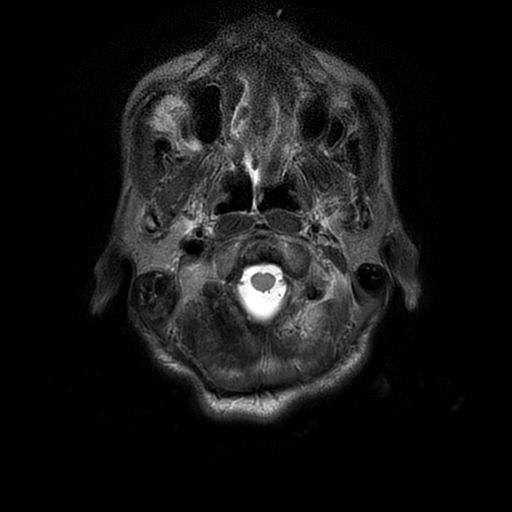
[im 28/28]
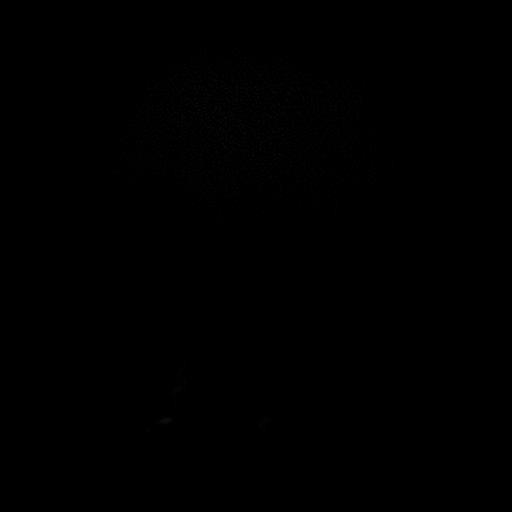

[Series 6: FLAIR · axial · 3.0mm · 0.45mm/px · z∈[-109,+42]mm · 2 of 27 slices shown (2 of 2)]
[im 1/27]
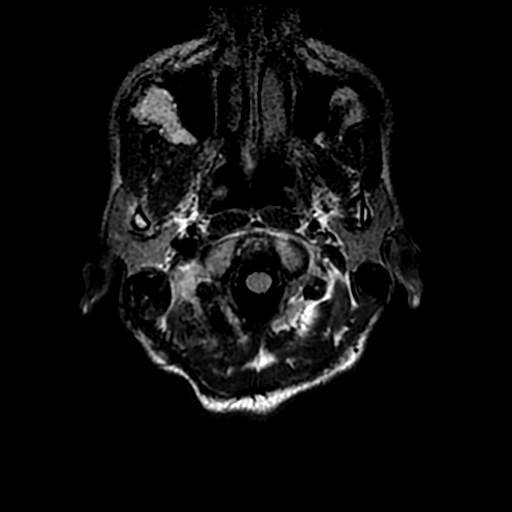
[im 27/27]
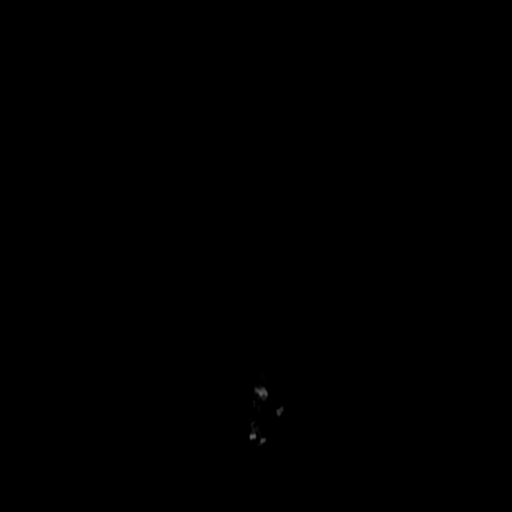

[Series 7: SWI · axial · 3.0mm · 0.47mm/px · z∈[-110,-33]mm · 4 of 108 slices shown]
[im 1/108]
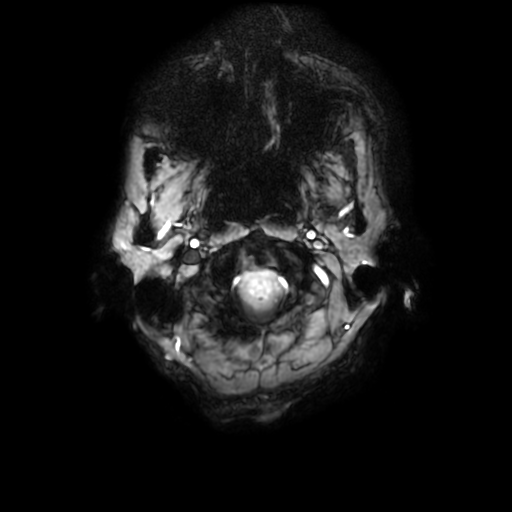
[im 18/108]
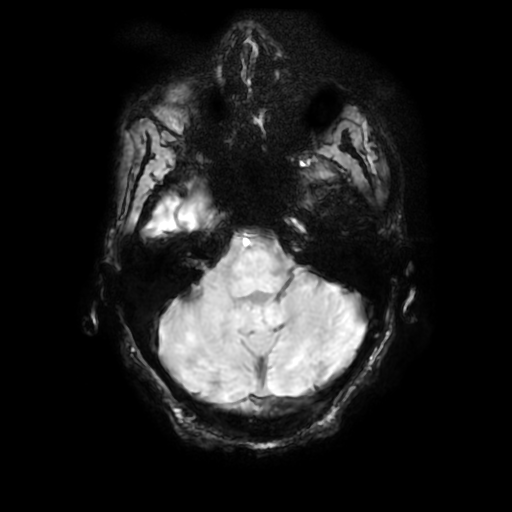
[im 36/108]
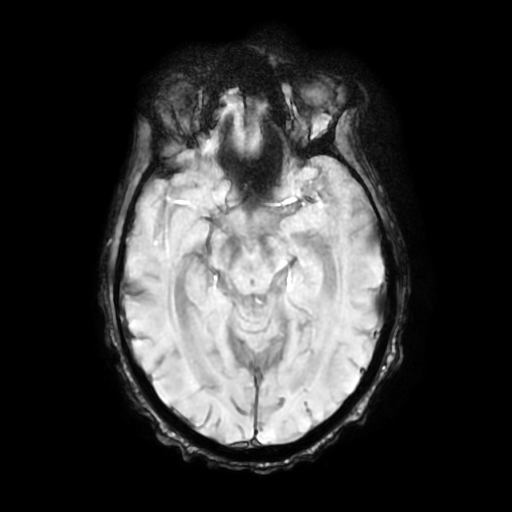
[im 54/108]
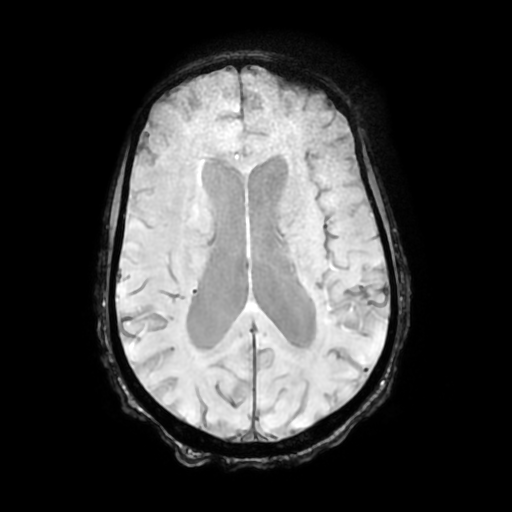

[Series 9: T2 post-contrast · coronal · 5.0mm · 0.39mm/px · 2 of 32 slices shown]
[im 1/32]
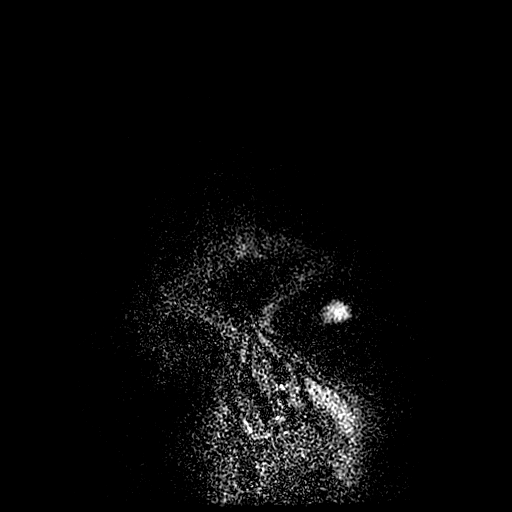
[im 32/32]
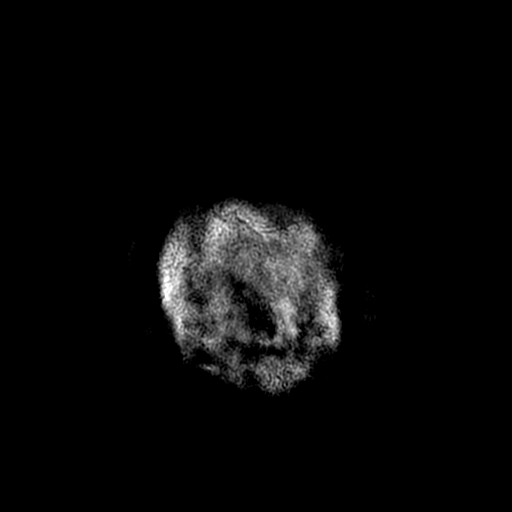

[Series 11: T1 post-contrast · coronal · 5.0mm · 0.39mm/px · 2 of 32 slices shown]
[im 1/32]
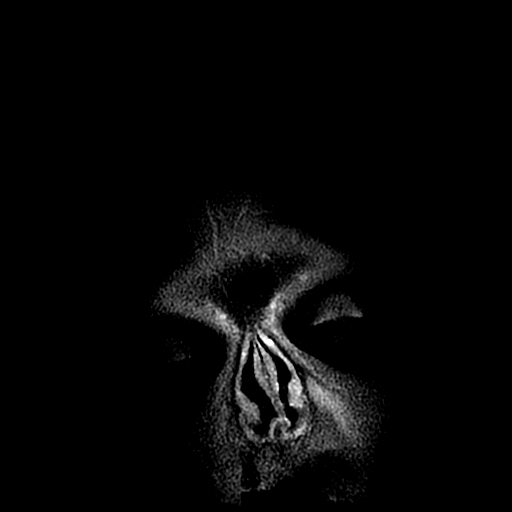
[im 32/32]
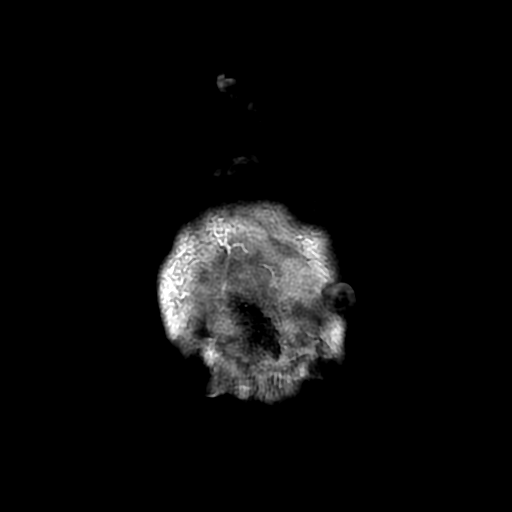

[Series 250: ADC · axial · 3.0mm · 0.94mm/px · z∈[-118,+45]mm · 4 of 57 slices shown (1 of 2)]
[im 1/57]
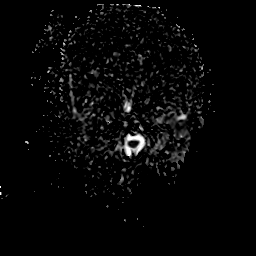
[im 19/57]
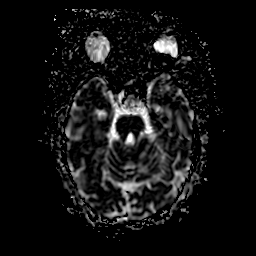
[im 38/57]
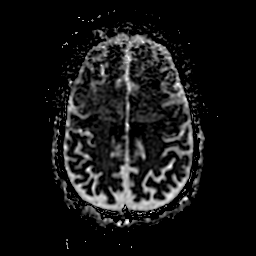
[im 57/57]
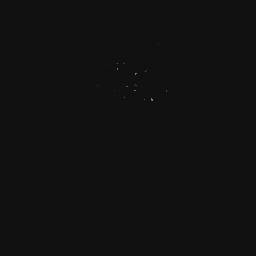

[Series 350: ADC · coronal · 4.0mm · 0.94mm/px · 2 of 38 slices shown (2 of 2)]
[im 1/38]
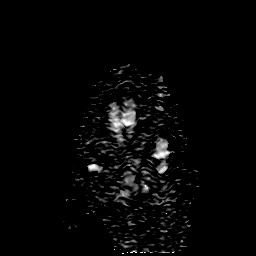
[im 38/38]
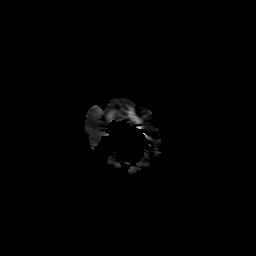

[30 of 48 positions shown; findings below may reference images not displayed]

FINDINGS: BRAIN: There is multifocal abnormal diffusion restriction within
both cerebral hemispheres, greatest along the cortices of the medial
frontal lobes. There is widespread hyperintense T2-weighted signal
abnormality, greatest in the cerebellum. These findings are new
compared to the prior MRI. There is abnormal contrast enhancement in
the bilateral frontal and left temporal lobes. There is no
extra-axial collection. No midline shift or other mass effect. No
hydrocephalus.

VASCULAR: The major intracranial arterial and venous sinus flow
voids are normal. Susceptibility-sensitive sequences show no chronic
microhemorrhage or superficial siderosis.

SKULL AND UPPER CERVICAL SPINE: Calvarial bone marrow signal is
normal. There is no skull base mass. The visualized upper cervical
spine and soft tissues are normal.

SINUSES/ORBITS: There are no fluid levels or advanced mucosal
thickening. The mastoid air cells and middle ear cavities are free
of fluid. The orbits are normal.
IMPRESSION: 1. Multifocal signal abnormality and abnormal contrast enhancement
within both cerebral and cerebellar hemispheres. In this clinical
scenario, the findings are most consistent with infectious
cerebritis, and the pattern is compatible with cerebral
toxoplasmosis.
2. No discrete abscess, though the largest area of contrast
enhancement in the left frontal lobe may be in the early stages of
abscess formation.
3. No hemorrhage or mass effect.

## 2019-08-21 MED ORDER — FLUCONAZOLE IN SODIUM CHLORIDE 400-0.9 MG/200ML-% IV SOLN
400.0000 mg | INTRAVENOUS | Status: AC
  Administered 2019-08-21 – 2019-09-04 (×15): 400 mg via INTRAVENOUS
  Filled 2019-08-21 (×15): qty 200

## 2019-08-21 MED ORDER — GADOBUTROL 1 MMOL/ML IV SOLN
4.5000 mL | Freq: Once | INTRAVENOUS | Status: AC | PRN
  Administered 2019-08-21: 4.5 mL via INTRAVENOUS

## 2019-08-21 MED ORDER — PYRIMETHAMINE-LEUCOVORIN 50-25 MG PO CAPS
50.0000 mg | ORAL_CAPSULE | Freq: Every day | ORAL | Status: DC
  Administered 2019-08-21 – 2019-09-06 (×17): 50 mg
  Filled 2019-08-21 (×18): qty 1

## 2019-08-21 MED ORDER — HALOPERIDOL LACTATE 5 MG/ML IJ SOLN
1.0000 mg | Freq: Once | INTRAMUSCULAR | Status: AC
  Administered 2019-08-21: 1 mg via INTRAVENOUS

## 2019-08-21 MED ORDER — SULFAMETHOXAZOLE-TRIMETHOPRIM 200-40 MG/5ML PO SUSP: 20.0000 mL | ORAL | Status: DC

## 2019-08-21 MED ORDER — SULFAMETHOXAZOLE-TRIMETHOPRIM 200-40 MG/5ML PO SUSP
20.0000 mL | ORAL | Status: DC
  Administered 2019-08-23 – 2019-09-06 (×7): 20 mL
  Filled 2019-08-21 (×7): qty 20

## 2019-08-21 NOTE — Progress Notes (Addendum)
Occupational Therapy Treatment Patient Details Name: Arthur [name] MRN: [id number] DOB: 1981/07/24 Today's Date: 08/21/2019    History of present illness Pt is a 38 y.o. male admitted 07/28/19 with c/o worsening dizziness, headaches; has not taken HIV medications for ~1 yr due to afforability issues. Brain MRI consistent with opportunistic infection throughout both cerebral hemispheres. Pt also with lost R eye vision. Lumbar puncture 11/3. 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 Transfer to ICU 11/15 with worsening mental and respiratory status. PMH includes untreated HIB, depression, substance abuse.   OT comments  Patient progressing slowly. Completed bed mobility with max assist +2, transfers using stedy with max assist +2 from EOB and mod assist +2 from stedy.  R lateral/posterior lean EOB, R bias in stedy, responding to multimodal cueing and utilized mirror for visual feedback.  Pt communicating with head nods and hand gestures, no verbalizations.  Patient follows approx 75% of simple 1 step commands initially, fading with fatigue.  Continue to recommend post acute rehab, will follow acutely. Updated goals today, downgraded since worsening mental/respiratory status and ICU stay.   Follow Up Recommendations  Supervision/Assistance - 24 hour(post acute rehab (CIR vs SNF) pending progress)    Equipment Recommendations  3 in 1 bedside commode    Recommendations for Other Services      Precautions / Restrictions Precautions Precautions: Fall Precaution Comments: watch BP, HR Restrictions Weight Bearing Restrictions: No       Mobility Bed Mobility Overal bed mobility: Needs Assistance Bed Mobility: Supine to Sit     Supine to sit: Max assist;+2 for physical assistance;+2 for safety/equipment     General bed mobility comments: assist for mgmt of LEs/trunk; increased time required to initate and process tasks   Transfers Overall transfer  level: Needs assistance   Transfers: Sit to/from Stand;Stand Pivot Transfers Sit to Stand: Max assist;Mod assist;+2 physical assistance;+2 safety/equipment Stand pivot transfers: Total assist;+2 physical assistance;+2 safety/equipment       General transfer comment: max assist +2 from EOB, mod assist +2 from stedy; pivot to recliner using stedy     Balance Overall balance assessment: Needs assistance Sitting-balance support: Feet supported;Bilateral upper extremity supported Sitting balance-Leahy Scale: Zero Sitting balance - Comments: posterior and R lateral lean at EOB, attempted using BUEs to support self at EOB but unable to maintain position; overall max assist required    Standing balance support: Bilateral upper extremity supported;During functional activity Standing balance-Leahy Scale: Zero Standing balance comment: with R lateral bias in standing; able to correct towards midline with multimodal cueing                            ADL either performed or assessed with clinical judgement   ADL Overall ADL's : Needs assistance/impaired Eating/Feeding: NPO   Grooming: Wash/dry face;Set up;Bed level;Moderate assistance Grooming Details (indicate cue type and reason): setup with wet washcloth to wipe lips with supervision; seated in recliner mod assist to suction mouth to support R elbow to bring hand to mouth                 Toilet Transfer: Maximal assistance;+2 for physical assistance;+2 for safety/equipment Toilet Transfer Details (indicate cue type and reason): using stedy, simulated to recliner          Functional mobility during ADLs: Moderate assistance;Maximal assistance;+2 for physical assistance;+2 for safety/equipment General ADL Comments: pt limited by impaired cognition, weakness, balance and activity tolerance  Vision       Perception     Praxis      Cognition Arousal/Alertness: Awake/alert Behavior During Therapy: Flat  affect Overall Cognitive Status: Difficult to assess Area of Impairment: Problem solving;Safety/judgement;Following commands;Attention;Awareness                   Current Attention Level: Sustained   Following Commands: Follows one step commands with increased time;Follows one step commands inconsistently Safety/Judgement: Decreased awareness of deficits;Decreased awareness of safety Awareness: Intellectual Problem Solving: Slow processing;Requires verbal cues;Requires tactile cues;Decreased initiation;Difficulty sequencing General Comments: pt is nonverbal during session, commuincating with hand gestures and head nods; pt following approx 75% of commands with increased time (fading when fatigued), requires multimodal cueing         Exercises Exercises: Other exercises Other Exercises Other Exercises: seated in recliner worked on head turns as pt tends to hold head to R; x 4 reps min assist turning L to R then repositioned with pillow at shoulder and towel roll to support head/neck at midline  Other Exercises: utilized mirror in room for visual feed back of posture, correcting R lateral lean with min assist but becoming emotional after seeing himself in a mirror    Shoulder Instructions       General Comments VSS, BP soft    Pertinent Vitals/ Pain       Pain Assessment: Faces Faces Pain Scale: No hurt  Home Living                                          Prior Functioning/Environment              Frequency  Min 2X/week        Progress Toward Goals  OT Goals(current goals can now be found in the care plan section)  Progress towards OT goals: Progressing toward goals  Acute Rehab OT Goals Patient Stated Goal: none stated  Plan Discharge plan needs to be updated;Frequency remains appropriate    Co-evaluation    PT/OT/SLP Co-Evaluation/Treatment: Yes Reason for Co-Treatment: For patient/therapist safety;To address functional/ADL  transfers   OT goals addressed during session: ADL's and self-care      AM-PAC OT "6 Clicks" Daily Activity     Outcome Measure   Help from another person eating meals?: Total(NPO) Help from another person taking care of personal grooming?: A Lot Help from another person toileting, which includes using toliet, bedpan, or urinal?: Total Help from another person bathing (including washing, rinsing, drying)?: A Lot Help from another person to put on and taking off regular upper body clothing?: A Lot Help from another person to put on and taking off regular lower body clothing?: Total 6 Click Score: 9    End of Session Equipment Utilized During Treatment: Other (comment)(stedy)  OT Visit Diagnosis: Muscle weakness (generalized) (M62.81)   Activity Tolerance Patient tolerated treatment well   Patient Left in chair;with call bell/phone within reach;with nursing/sitter in room;with chair alarm set   Nurse Communication Mobility status;Precautions        Time: 1610-9604 OT Time Calculation (min): 34 min  Charges: OT General Charges $OT Visit: 1 Visit OT Treatments $Self Care/Home Management : 8-22 mins  Chancy Milroy, OT Acute Rehabilitation Services Pager [phone] Office [phone]    Chancy Milroy 08/21/2019, 1:52 PM

## 2019-08-21 NOTE — Progress Notes (Signed)
PROGRESS NOTE    Arthur [name]  YQI:[id number] DOB: [birth year] DOA: 07/28/2019 PCP: Truman Hayward, MD     Brief Narrative:  Arthur [name] is a 38 year old male with past medical history significant for HIV, substance abuse, depression and GERD who presented to the emergency department on 11/1 with chief complaint of dizziness, weakness and intermittent headache. He has not taken any of his HIV medications for about 1 year due to affordability issue. MRI brain done in ED showed widespread areas of abnormal low level restricted diffusion and postcontrast enhancement throughout both cerebral hemispheres, predominantly infratentorial, with the dominant abnormality in the left anterior frontal white matter consistent with an opportunistic infection, such as toxoplasmosis, tuberculosis or fungal cerebritis/meningitis, or parasitic infection. Patient was admitted to hospital service and ID was consulted. He then revealed that he has lost his vision in his right eye. ID started him on penicillin and ganciclovir for possible CMV retinitis versus synovitis retinitis and ophthalmology consulted.  11/3: status post lumbar puncture by neurology 11/15: transferred to ICU for tx of severe hyponatremia/SIADH for tx with hypertonic saline as his mental status was worse, and respiratory status was declining 11/16: transferred to SDU  New events last 24 hours / Subjective: No new events overnight. Patient denies any complaints.   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 RIPEtx stopped -Appreciate ID. Completed PCN -Continue pyrimethamin-leucovorin, sulfadiazine   Acute toxic metabolic encephalopathy, multifactorial including CNS infection and SIADH -Repeat CT head without new process, evolving lesions of cerebellum and left frontal lobe -Stable   Right eye blindness, optic retinitis/neurititis -Mainly concerned between syphilis and CMV-butCMV PCR negative in the CNS -Appreciate ophthalmology  -Received steroid injection 08/02/2019 -Repeat MRIbrain and orbits show improvement of brain lesions and optic neurititis, retinitis  Severehyponatremia -Likely 2/2severeSIADH -Transferred to ICU for 3% saline and IV lasix -Nephrology consulted; signed off 11/20  -Hold lasix, salt tab, Declomycin now that Na is trending upward   AKI -Stable Cr 1.50, monitor   Leukopenia/immunocompromised in the setting of untreated HIV -Fluconazole for thrush  Untreated HIV -ID following, off tx for now  -Bactrim for ppx   Dysphagia -Seen by SLP -Tube feeding managed by dietitian   Severe protein calorie malnutrition -Tube feeding managed by dietitian   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 Seen by palliative care. Currently recommending continuing aggressive measures   DVT prophylaxis: Lovenox Code Status: Full Family Communication: None at bedside, discussed with mother over the phone  Disposition Plan: Discussed with mother, she feels that he is improving and is hesitant to agree to PEG. She would like to further discuss treatment plan with PT and SLP team as she has many questions regarding their recommendation prior to making any decisions.    Antimicrobials:  Anti-infectives (From admission, onward)   Start     Dose/Rate Route Frequency  Ordered Stop   08/23/19 0900  sulfamethoxazole-trimethoprim (BACTRIM) 200-40 MG/5ML  suspension 20 mL     20 mL Oral 3 times weekly 08/21/19 0950     08/21/19 1100  fluconazole (DIFLUCAN) IVPB 400 mg     400 mg 100 mL/hr over 120 Minutes Intravenous Every 24 hours 08/21/19 1009     08/20/19 1200  sulfaDIAZINE tablet 1,000 mg     1,000 mg Per Tube Every 6 hours 08/20/19 1118     08/19/19 1015  sulfamethoxazole-trimethoprim (BACTRIM DS) 800-160 MG per tablet 1 tablet  Status:  Discontinued     1 tablet Oral 3 times weekly 08/19/19 1002 08/21/19 0950   08/16/19 2000  Pyrimethamine-Leucovorin 50-25 MG CAPS 50 mg  Status:  Discontinued     50 mg Oral Daily 08/15/19 1337 08/15/19 1357   08/16/19 2000  Pyrimethamine-Leucovorin 50-25 MG CAPS 50 mg     50 mg Oral Daily 08/15/19 1357     08/15/19 2000  Pyrimethamine-Leucovorin 50-25 MG CAPS 200 mg  Status:  Discontinued     200 mg Oral  Once 08/15/19 1337 08/15/19 1357   08/15/19 2000  Pyrimethamine-Leucovorin 50-25 MG CAPS 200 mg     200 mg Oral  Once 08/15/19 1357 08/15/19 2114   08/15/19 1800  sulfaDIAZINE tablet 1,000 mg  Status:  Discontinued     1,000 mg Oral Every 6 hours 08/15/19 1337 08/20/19 1118   08/14/19 1000  demeclocycline (DECLOMYCIN) tablet 150 mg  Status:  Discontinued     150 mg Oral Every 12 hours 08/14/19 0848 08/14/19 0907   08/14/19 1000  demeclocycline (DECLOMYCIN) tablet 150 mg  Status:  Discontinued     150 mg Per Tube Every 12 hours 08/14/19 0907 08/19/19 1030   08/14/19 1000  fluconazole (DIFLUCAN) tablet 200 mg     200 mg Per Tube Daily 08/14/19 0907 08/18/19 1055   08/13/19 1015  fluconazole (DIFLUCAN) tablet 200 mg  Status:  Discontinued     200 mg Oral Daily 08/13/19 1009 08/14/19 0907   08/13/19 1000  penicillin g benzathine (BICILLIN LA) 1200000 UNIT/2ML injection 2.4 Million Units     2.4 Million Units Intramuscular  Once 08/08/19 1019 08/13/19 1222   08/12/19 1400  sulfamethoxazole-trimethoprim (BACTRIM) 272.48  mg in dextrose 5 % 500 mL IVPB  Status:  Discontinued     10 mg/kg/day  54.5 kg 344.7 mL/hr over 90 Minutes Intravenous Every 12 hours 08/12/19 0854 08/15/19 1337   08/11/19 1400  sulfamethoxazole-trimethoprim (BACTRIM) 400-80 MG per tablet 3 tablet  Status:  Discontinued     3 tablet Oral Every 8 hours 08/11/19 1349 08/12/19 0854   08/10/19 1000  abacavir-dolutegravir-lamiVUDine (TRIUMEQ) 600-50-300 MG per tablet 1 tablet  Status:  Discontinued     1 tablet Oral Daily 08/09/19 1514 08/12/19 0848   08/10/19 1000  sulfamethoxazole-trimethoprim (BACTRIM) 160 mg in dextrose 5 % 250 mL IVPB  Status:  Discontinued     160 mg 260 mL/hr over 60 Minutes Intravenous Daily 08/09/19 1514 08/11/19 1324   08/09/19 1730  metroNIDAZOLE (FLAGYL) IVPB 500 mg     500 mg 100 mL/hr over 60 Minutes Intravenous Every 8 hours 08/09/19 1636 08/13/19 1848   08/08/19 1000  rifampin (RIFADIN) 60 mg/mL oral suspension 600 mg  Status:  Discontinued     600 mg Per Tube Daily 08/07/19 1106 08/07/19 1319   08/08/19 1000  bictegravir-emtricitabine-tenofovir AF (BIKTARVY) 50-200-25 MG per tablet 1 tablet  Status:  Discontinued     1 tablet Oral Daily 08/07/19 1419 08/09/19 1514   08/07/19 1330  bictegravir-emtricitabine-tenofovir AF (BIKTARVY) 50-200-25 MG per tablet 1 tablet  Status:  Discontinued     1 tablet Oral Daily 08/07/19 1319 08/07/19 1419   08/07/19 1000  ethambutol (MYAMBUTOL) tablet 1,200 mg  Status:  Discontinued     1,200 mg Per Tube Daily 08/06/19 1423 08/07/19 1319   08/07/19 1000  isoniazid (NYDRAZID) tablet 300 mg  Status:  Discontinued     300 mg Per Tube Daily 08/06/19 1423 08/07/19 1319   08/07/19 1000  rifampin (RIFADIN) 60 mg/mL oral suspension 600 mg  Status:  Discontinued     600 mg Oral Daily 08/06/19 1423 08/07/19 1106   08/07/19 1000  sulfamethoxazole-trimethoprim (BACTRIM DS) 800-160 MG per tablet 1 tablet  Status:  Discontinued     1 tablet Per Tube Daily 08/06/19 1423 08/09/19 1514    08/07/19 1000  pyrazinamide tablet 1,500 mg  Status:  Discontinued     1,500 mg Per Tube Daily 08/06/19 1423 08/07/19 1319   08/03/19 1600  fluconazole (DIFLUCAN) IVPB 200 mg  Status:  Discontinued     200 mg 100 mL/hr over 60 Minutes Intravenous Every 24 hours 08/03/19 1548 08/13/19 1009   07/30/19 1700  rifampin (RIFADIN) capsule 600 mg  Status:  Discontinued     600 mg Oral Daily 07/30/19 1604 08/06/19 1423   07/30/19 1700  isoniazid (NYDRAZID) tablet 300 mg  Status:  Discontinued     300 mg Oral Daily 07/30/19 1604 08/06/19 1423   07/30/19 1700  pyrazinamide tablet 1,500 mg  Status:  Discontinued     1,500 mg Oral Daily 07/30/19 1604 08/06/19 1423   07/30/19 1700  ethambutol (MYAMBUTOL) tablet 1,200 mg  Status:  Discontinued     1,200 mg Oral Daily 07/30/19 1604 08/06/19 1423   07/29/19 1700  sulfamethoxazole-trimethoprim (BACTRIM DS) 800-160 MG per tablet 1 tablet  Status:  Discontinued     1 tablet Oral Daily 07/29/19 1601 08/06/19 1423   07/29/19 1700  ganciclovir (CYTOVENE) 285 mg in sodium chloride 0.9 % 100 mL IVPB  Status:  Discontinued     5 mg/kg  57 kg 100 mL/hr over 60 Minutes Intravenous Every 12 hours 07/29/19 1603 08/07/19 1526   07/29/19 1630  penicillin G potassium 12 Million Units in dextrose 5 % 250 mL IVPB  Status:  Discontinued     12 Million Units 250 mL/hr over 60 Minutes Intravenous Every 12 hours 07/29/19 1552 07/29/19 1559   07/29/19 1630  penicillin G potassium 12 Million Units in dextrose 5 % 500 mL continuous infusion     12 Million Units 41.7 mL/hr over 12 Hours Intravenous Every 12 hours 07/29/19 1559 08/12/19 2359       Objective: Vitals:   08/21/19 0001 08/21/19 0351 08/21/19 0752 08/21/19 0805  BP: 124/60 105/71 97/70   Pulse:  (!) 101 93 96  Resp: (!) 27 (!) 28 (!) 23 16  Temp:  99.5 F (37.5 C) 98.7 F (37.1 C)   TempSrc:  Axillary Oral   SpO2:  95% 95% 99%  Weight:  47.1 kg    Height:        Intake/Output Summary (Last 24 hours) at  08/21/2019 1037 Last data filed at 08/21/2019 0600 Gross per 24 hour  Intake 1440 ml  Output 1150 ml  Net 290 ml   Filed Weights   08/18/19 0500 08/20/19 0300 08/21/19 0351  Weight: 47.2 kg 50.9 kg 47.1 kg    Examination: General exam: Appears calm and comfortable  Respiratory system:  Rhonchi anteriorly. Respiratory effort normal. On room air Cardiovascular system: S1 & S2 heard, RRR. No pedal edema. Gastrointestinal system: Abdomen is nondistended, soft and nontender. Normal bowel sounds heard. Central nervous system: Alert and answers simple questions with head non/shake   Extremities: Symmetric in appearance bilaterally  Skin: No rashes, lesions or ulcers on exposed skin     Data Reviewed: I have personally reviewed following labs and imaging studies  CBC: Recent Labs  Lab 08/15/19 0754 08/16/19 0441 08/18/19 0217 08/20/19 0249  WBC 2.1* 2.2* 3.2* 3.1*  NEUTROABS  --   --   --  1.9  HGB 15.5 14.9 14.7 14.0  HCT 43.9 43.0 43.3 42.3  MCV 85.4 86.2 88.2 90.8  PLT 303 339 350 288   Basic Metabolic Panel: Recent Labs  Lab 08/16/19 0441  08/17/19 0213  08/18/19 0217  08/19/19 0215 08/19/19 0901 08/19/19 1501 08/19/19 1953 08/20/19 0249 08/20/19 0949  NA 129*   < > 130*   < > 132*   < > 137 138 138 139 141 143  K 5.0  --  5.0  --  4.8  --  4.3  --   --   --  4.4  --   CL 91*  --  91*  --  92*  --  99  --   --   --  104  --   CO2 27  --  26  --  28  --  27  --   --   --  28  --   GLUCOSE 151*  --  148*  --  149*  --  152*  --   --   --  153*  --   BUN 40*  --  53*  --  67*  --  69*  --   --   --  58*  --   CREATININE 1.18  --  1.56*  --  1.69*  --  1.63*  --   --   --  1.50*  --   CALCIUM 9.4  --  9.5  --  9.6  --  9.5  --   --   --  9.4  --   MG  --   --   --   --   --   --   --   --   --   --  2.8*  --    < > = values in this interval not displayed.   GFR: Estimated Creatinine Clearance: 45.4 mL/min (A) (by C-G formula based on SCr of 1.5 mg/dL (H)). Liver  Function Tests: Recent Labs  Lab 08/20/19 0249  AST 39  ALT 26  ALKPHOS 51  BILITOT 0.6  PROT 8.7*  ALBUMIN 3.2*   No results for input(s): LIPASE, AMYLASE in the last 168 hours. No results for input(s): AMMONIA in the last 168 hours. Coagulation Profile: No results for input(s): INR, PROTIME in the last 168 hours. Cardiac Enzymes: No results for input(s): CKTOTAL, CKMB, CKMBINDEX, TROPONINI in the last 168 hours. BNP (last 3 results) No results for input(s): PROBNP in the last 8760 hours. HbA1C: No results for input(s): HGBA1C in the last 72 hours. CBG: Recent Labs  Lab 08/20/19 1654 08/20/19 1929 08/20/19 2346 08/21/19 0350 08/21/19 0751  GLUCAP 72 145* 136* 125* 109*   Lipid Profile: No results for input(s): CHOL, HDL, LDLCALC, TRIG, CHOLHDL, LDLDIRECT in the last 72 hours. Thyroid Function Tests: No results for input(s): TSH, T4TOTAL, FREET4, T3FREE,  THYROIDAB in the last 72 hours. Anemia Panel: No results for input(s): VITAMINB12, FOLATE, FERRITIN, TIBC, IRON, RETICCTPCT in the last 72 hours. Sepsis Labs: No results for input(s): PROCALCITON, LATICACIDVEN in the last 168 hours.  No results found for this or any previous visit (from the past 240 hour(s)).    Radiology Studies: No results found.    Scheduled Meds: . enoxaparin (LOVENOX) injection  40 mg Subcutaneous Q24H  . famotidine  20 mg Per Tube Daily  . feeding supplement (PRO-STAT SUGAR FREE 64)  30 mL Per Tube Daily  . insulin aspart  0-9 Units Subcutaneous Q4H  . magic mouthwash w/lidocaine  10 mL Oral TID AC & HS  . mouth rinse  15 mL Mouth Rinse BID  . mirtazapine  15 mg Per Tube QHS  . multivitamin with minerals  1 tablet Per Tube Daily  . Pyrimethamine-Leucovorin  50 mg Oral Daily  . sulfaDIAZINE  1,000 mg Per Tube Q6H  . [START ON 08/23/2019] sulfamethoxazole-trimethoprim  20 mL Oral 3 times weekly   Continuous Infusions: . chlorproMAZINE (THORAZINE) IV 12.5 mg (08/09/19 1135)  . feeding  supplement (OSMOLITE 1.5 CAL) 1,000 mL (08/21/19 0903)  . fluconazole (DIFLUCAN) IV       LOS: 23 days      Time spent: 35 minutes   Noralee StainJennifer Alaine Loughney, DO [facility] 08/21/2019, 10:37 AM   Available via Epic secure chat 7am-7pm After these hours, please refer to coverage provider listed on [email or web address]

## 2019-08-21 NOTE — Progress Notes (Signed)
Physical Therapy Treatment Patient Details Name: Arthur [name] MRN: [id number] DOB: [birth year] Today's Date: 08/21/2019    History of Present Illness Pt is a 38 y.o. male admitted 07/28/19 with c/o worsening dizziness, headaches; has not taken HIV medications for ~1 yr due to afforability issues. Brain MRI consistent with opportunistic infection throughout both cerebral hemispheres. Pt also with lost R eye vision. Lumbar puncture 11/3. 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 Transfer to ICU 11/15 with worsening mental and respiratory status. PMH includes untreated HIB, depression, substance abuse.   PT Comments    Pt progressing with mobility, more alert and engaged this session, nodding yes/no when asked simple questions and following 1-step commands ~75% of time, although this fades with fatigue. Pt requiring mod-maxA+2 to perform standing trials with Stedy. Pt with generalized weakness (L>R) with R lateral/posterior lean with sitting balance. Continue to recommend post-acute rehab; pending activity tolerance, feel pt would benefit from intensive CIR-level therapies to maximize functional mobility and independence.  Update 4:30p - Called to room to speak with mother Arthur [name]). Increased time discussing pt's current functional status and recommendation for post-acute rehab. Arthur [name] adamant against SNF and would rather have pt return home with 24/7 family assist - discussed potential for CIR pending pt tolerance, Arthur [name] interested in this option. If pt to return home, will likely require +2 physical assist and DME (w/c, hospital bed, lift equipment...); Arthur [name] states understanding of needs for higher level care.     Follow Up Recommendations  CIR;Supervision/Assistance - 24 hour(family declining SNF)     Equipment Recommendations  (TBD)    Recommendations for Other Services       Precautions / Restrictions Precautions Precautions: Fall;Other  (comment) Precaution Comments: watch BP, HR Restrictions Weight Bearing Restrictions: No    Mobility  Bed Mobility Overal bed mobility: Needs Assistance Bed Mobility: Supine to Sit     Supine to sit: Max assist;+2 for physical assistance;+2 for safety/equipment     General bed mobility comments: assist for mgmt of LEs/trunk; increased time required to initate and process tasks   Transfers Overall transfer level: Needs assistance Equipment used: Ambulation equipment used Transfers: Sit to/from [employer] Sit to Stand: Max assist;Mod assist;+2 physical assistance;+2 safety/equipment Stand pivot transfers: Total assist;+2 physical assistance;+2 safety/equipment       General transfer comment: max assist +2 from EOB into steady, min-mod assist +2 standing from elevated Stedy seat and tactile cues for L hip extension; pivot to recliner using stedy; pt initiated RUE grip on Stedy well with cues, requiring assist to manage LLE  Ambulation/Gait                 Stairs             Wheelchair Mobility    Modified Rankin (Stroke Patients Only)       Balance Overall balance assessment: Needs assistance Sitting-balance support: Feet supported;Bilateral upper extremity supported Sitting balance-Leahy Scale: Zero Sitting balance - Comments: posterior and R lateral lean at EOB, attempted using BUEs to support self at EOB but unable to maintain position; overall max assist required  Postural control: Right lateral lean Standing balance support: Bilateral upper extremity supported;During functional activity Standing balance-Leahy Scale: Zero Standing balance comment: with R lateral bias in standing; able to correct towards midline with multimodal cueing when looking at self in mirror from [place] seat  Cognition Arousal/Alertness: Awake/alert Behavior During Therapy: Flat affect Overall Cognitive Status: Difficult to  assess Area of Impairment: Problem solving;Safety/judgement;Following commands;Attention;Awareness                   Current Attention Level: Sustained   Following Commands: Follows one step commands with increased time;Follows one step commands inconsistently Safety/Judgement: Decreased awareness of deficits;Decreased awareness of safety Awareness: Intellectual Problem Solving: Slow processing;Requires verbal cues;Requires tactile cues;Decreased initiation;Difficulty sequencing General Comments: pt is nonverbal during session, commuincating with hand gestures and head nods; pt following approx 75% of commands with increased time (fading when fatigued), requires multimodal cueing; mother reports pt talking some words to her, baseline ADHD and not very social      Exercises Other Exercises Other Exercises: seated in recliner worked on head turns as pt tends to hold head to R; x 4 reps min assist turning L to R then repositioned with pillow at shoulder and towel roll to support head/neck at midline  Other Exercises: utilized mirror in room for visual feed back of posture, correcting R lateral lean with min assist but becoming emotional after seeing himself in a mirror     General Comments General comments (skin integrity, edema, etc.): VSS, BP soft. Increased time speaking with mother later in day after session - family adamant against SNF (COVID, no visitors, etc.), reports she and other family (daughter is CNA) available for 24/7 assist; initiated discussion of potential CIR (pending tolerance), if home will likely need max DME (hospital bed, hoyer lift, w/c, etc.) and assist+2; mother receptive to this      Pertinent Vitals/Pain Pain Assessment: Faces Faces Pain Scale: No hurt    Home Living                      Prior Function            PT Goals (current goals can now be found in the care plan section) Acute Rehab PT Goals Patient Stated Goal: If not CIR, home with  family assist PT Goal Formulation: With family Time For Goal Achievement: 09/04/19 Potential to Achieve Goals: Fair Progress towards PT goals: Progressing toward goals    Frequency    Min 3X/week      PT Plan Discharge plan needs to be updated;Frequency needs to be updated    Co-evaluation PT/OT/SLP Co-Evaluation/Treatment: Yes Reason for Co-Treatment: For patient/therapist safety;To address functional/ADL transfers PT goals addressed during session: Mobility/safety with mobility;Balance        AM-PAC PT "6 Clicks" Mobility   Outcome Measure  Help needed turning from your back to your side while in a flat bed without using bedrails?: A Lot Help needed moving from lying on your back to sitting on the side of a flat bed without using bedrails?: A Lot Help needed moving to and from a bed to a chair (including a wheelchair)?: Total Help needed standing up from a chair using your arms (e.g., wheelchair or bedside chair)?: Total Help needed to walk in hospital room?: Total Help needed climbing 3-5 steps with a railing? : Total 6 Click Score: 8    End of Session   Activity Tolerance: Patient tolerated treatment well Patient left: in chair;with call bell/phone within reach;with chair alarm set Nurse Communication: Mobility status PT Visit Diagnosis: Other abnormalities of gait and mobility (R26.89);Muscle weakness (generalized) (M62.81)     Time: 0160-1093 PT Time Calculation (min) (ACUTE ONLY): 33 min  Charges:  $Neuromuscular Re-education: 8-22 mins  Ina HomesJaclyn Yulonda [place], PT, DPT Acute Rehabilitation Services  Pager [phone] Office [phone]  Malachy ChamberJaclyn L Iantha Titsworth 08/21/2019, 5:13 PM

## 2019-08-21 NOTE — Progress Notes (Addendum)
[facility] for Infectious Disease    Date of Admission:  07/28/2019   Total days of antibiotics 11- toxo treatment ( 11/19 with sulfadiazine)          ID: Arthur [name] is a 38 y.o. male with advanced hiv disease, neurosyphilis and CNS toxo, cachexia Principal Problem:   Toxoplasmosis Active Problems:   Neurosyphilis   Nausea and vomiting   Acquired immunodeficiency syndrome (HCC)   Bradycardia   Hyponatremia   Vision loss   Abnormal brain MRI   Palliative care encounter   Thrush of mouth and esophagus (HCC)   Goals of care, counseling/discussion   Protein-calorie malnutrition, severe   Weakness generalized   Dysphagia    Subjective: Afebrile, thirsty this morning. Failed swallow study yesterday  Medications:  . enoxaparin (LOVENOX) injection  40 mg Subcutaneous Q24H  . famotidine  20 mg Per Tube Daily  . feeding supplement (PRO-STAT SUGAR FREE 64)  30 mL Per Tube Daily  . insulin aspart  0-9 Units Subcutaneous Q4H  . magic mouthwash w/lidocaine  10 mL Oral TID AC & HS  . mouth rinse  15 mL Mouth Rinse BID  . mirtazapine  15 mg Per Tube QHS  . multivitamin with minerals  1 tablet Per Tube Daily  . Pyrimethamine-Leucovorin  50 mg Oral Daily  . sulfaDIAZINE  1,000 mg Per Tube Q6H  . [START ON 08/23/2019] sulfamethoxazole-trimethoprim  20 mL Oral 3 times weekly    Objective: Vital signs in last 24 hours: Temp:  [98.2 F (36.8 C)-99.5 F (37.5 C)] 98.7 F (37.1 C) (11/25 0752) Pulse Rate:  [93-106] 96 (11/25 0805) Resp:  [16-28] 16 (11/25 0805) BP: (96-124)/(60-78) 97/70 (11/25 0752) SpO2:  [95 %-99 %] 99 % (11/25 0805) Weight:  [47.1 kg] 47.1 kg (11/25 0351) Physical Exam  Constitutional: He is oriented to person, only. . He appears cachexia, severe malnourished. No distress.  HENT: bitemporal wasting Mouth/Throat: Oropharynx is clear and moist. +thrush Cardiovascular: Normal rate, regular rhythm and normal heart sounds. Exam reveals no gallop and no  friction rub.  No murmur heard.  Pulmonary/Chest: Effort normal and breath sounds normal. No respiratory distress. He has no wheezes.  Abdominal: Soft. Bowel sounds are normal. He exhibits no distension. There is no tenderness.  Lymphadenopathy:  He has no cervical adenopathy.  Neurological: He is alert and oriented to person, place, and time.  Skin: Skin is warm and dry. No rash noted. No erythema.  Psychiatric: nods yes and no questions    Lab Results Recent Labs    08/19/19 0215  08/20/19 0249 08/20/19 0949  WBC  --   --  3.1*  --   HGB  --   --  14.0  --   HCT  --   --  42.3  --   NA 137   < > 141 143  K 4.3  --  4.4  --   CL 99  --  104  --   CO2 27  --  28  --   BUN 69*  --  58*  --   CREATININE 1.63*  --  1.50*  --    < > = values in this interval not displayed.   Liver Panel Recent Labs    08/20/19 0249  PROT 8.7*  ALBUMIN 3.2*  AST 39  ALT 26  ALKPHOS 51  BILITOT 0.6   Sedimentation Rate No results for input(s): ESRSEDRATE in the last 72 hours. C-Reactive Protein No  results for input(s): CRP in the last 72 hours.  Microbiology: reviewed Studies/Results: No results found.   Assessment/Plan: Severe protein-calorie malnutrition = recommend to switch to PEG tube feeding since dobhoff is not a long term solutions  Cns toxo = continue with current regimen  eso candidiasis = presumed, has evidence of thrush on exam, will continue on fluconazole 400mg  iv daily. Will check cmp tomorrow  oi phroph = on bactrim ds daily  hiv disease= holding on ART for now given CNS toxo treatment  Will see back on Monday, Dr comer available for questions.  [facility] for Infectious Diseases Cell: [phone] Pager: [phone]  08/21/2019, 9:55 AM

## 2019-08-21 NOTE — Progress Notes (Signed)
Physician notified: Maylene Roes At: 1648  Regarding: currently in MR awaiting brain w/wo, gave 1mg  haldol PRN for agitation. Pt still moving legs. Another PRN for scan completion?

## 2019-08-22 LAB — BASIC METABOLIC PANEL
Anion gap: 9 (ref 5–15)
BUN: 46 mg/dL — ABNORMAL HIGH (ref 6–20)
CO2: 27 mmol/L (ref 22–32)
Calcium: 9.2 mg/dL (ref 8.9–10.3)
Chloride: 110 mmol/L (ref 98–111)
Creatinine, Ser: 1.11 mg/dL (ref 0.61–1.24)
GFR calc Af Amer: >60 mL/min (ref >60)
GFR calc non Af Amer: >60 mL/min (ref >60)
Glucose, Bld: 134 mg/dL — ABNORMAL HIGH (ref 70–99)
Potassium: 4.8 mmol/L (ref 3.5–5.1)
Sodium: 146 mmol/L — ABNORMAL HIGH (ref 135–145)

## 2019-08-22 LAB — GLUCOSE, CAPILLARY
Glucose-Capillary: 99 mg/dL (ref 70–99)
Glucose-Capillary: 91 mg/dL (ref 70–99)
Glucose-Capillary: 112 mg/dL — ABNORMAL HIGH (ref 70–99)
Glucose-Capillary: 109 mg/dL — ABNORMAL HIGH (ref 70–99)
Glucose-Capillary: 118 mg/dL — ABNORMAL HIGH (ref 70–99)

## 2019-08-22 MED ORDER — TERBINAFINE HCL 1 % EX CREA
TOPICAL_CREAM | Freq: Every day | CUTANEOUS | Status: DC
  Administered 2019-08-22 – 2019-08-24 (×2): via TOPICAL
  Administered 2019-08-25: 1 via TOPICAL
  Administered 2019-08-26: 11:00:00 via TOPICAL
  Administered 2019-08-27: 1 via TOPICAL
  Administered 2019-08-28 – 2019-09-07 (×11): via TOPICAL
  Filled 2019-08-22 (×4): qty 12

## 2019-08-22 MED ORDER — FREE WATER
100.0000 mL | Freq: Three times a day (TID) | Status: DC
  Administered 2019-08-22 – 2019-09-07 (×40): 100 mL

## 2019-08-22 NOTE — Progress Notes (Signed)
PROGRESS NOTE    Arthur [name]  ZOX:[id number] DOB: [birth year] DOA: 07/28/2019 PCP: Randall Hiss, MD     Brief Narrative:  Arthur [name] is a 38 year old male with past medical history significant for HIV, substance abuse, depression and GERD who presented to the emergency department on 11/1 with chief complaint of dizziness, weakness and intermittent headache. He has not taken any of his HIV medications for about 1 year due to affordability issue. MRI brain done in ED showed widespread areas of abnormal low level restricted diffusion and postcontrast enhancement throughout both cerebral hemispheres, predominantly infratentorial, with the dominant abnormality in the left anterior frontal white matter consistent with an opportunistic infection, such as toxoplasmosis, tuberculosis or fungal cerebritis/meningitis, or parasitic infection. Patient was admitted to hospital service and ID was consulted. He then revealed that he has lost his vision in his right eye. ID started him on penicillin and ganciclovir for possible CMV retinitis versus synovitis retinitis and ophthalmology consulted.  11/3: status post lumbar puncture by neurology 11/15: transferred to ICU for tx of severe hyponatremia/SIADH for tx with hypertonic saline as his mental status was worse, and respiratory status was declining 11/16: transferred to SDU  New events last 24 hours / Subjective: No acute events.  Able to follow simple commands.  Denies any new complaints.  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 RIPEtx stopped -Appreciate ID. Completed PCN -Continue pyrimethamin-leucovorin, sulfadiazine   Acute toxic metabolic encephalopathy, multifactorial including CNS infection and SIADH -Repeat CT head without new process, evolving lesions of cerebellum and left frontal lobe -Stable   Right eye blindness, optic retinitis/neurititis -Mainly concerned between syphilis and CMV-butCMV PCR negative in the CNS -Appreciate ophthalmology  -Received steroid injection 08/02/2019 -Repeat MRIbrain and orbits show improvement of brain lesions and optic neurititis, retinitis  Severehyponatremia -Likely 2/2severeSIADH -Transferred to ICU for 3% saline and IV lasix -Nephrology consulted; signed off 11/20  -Hold lasix, salt tab, Declomycin now that Na is trending upward  -Now hypernatremic, start free water flushes  AKI -Resolved  Leukopenia/immunocompromised in the setting of untreated HIV -Fluconazole for thrush  Untreated HIV -ID following, off tx for now  -Bactrim for ppx   Dysphagia Severe protein calorie malnutrition -Seen by SLP -Tube feeding managed by dietitian  -Discussed PEG tube with mom; she is hesitant to agree to a procedure as she sees that he is improving.  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 Seen by palliative care. Currently recommending continuing aggressive measures   DVT prophylaxis: Lovenox Code Status: Full Family Communication: None at bedside Disposition Plan: Patient's mom refusing SNF placement.  CIR evaluation to follow   Antimicrobials:  Anti-infectives (From admission, onward)   Start     Dose/Rate Route Frequency Ordered Stop   08/23/19 0900   sulfamethoxazole-trimethoprim (BACTRIM) 200-40 MG/5ML suspension 20 mL  Status:  Discontinued  20 mL Oral 3 times weekly 08/21/19 0950 08/21/19 1311   08/23/19 0900  sulfamethoxazole-trimethoprim (BACTRIM) 200-40 MG/5ML suspension 20 mL     20 mL Per Tube 3 times weekly 08/21/19 1311     08/21/19 2000  Pyrimethamine-Leucovorin 50-25 MG CAPS 50 mg     50 mg Per Tube Daily 08/21/19 1312     08/21/19 1100  fluconazole (DIFLUCAN) IVPB 400 mg     400 mg 100 mL/hr over 120 Minutes Intravenous Every 24 hours 08/21/19 1009     08/20/19 1200  sulfaDIAZINE tablet 1,000 mg     1,000 mg Per Tube Every 6 hours 08/20/19 1118     08/19/19 1015  sulfamethoxazole-trimethoprim (BACTRIM DS) 800-160 MG per tablet 1 tablet  Status:  Discontinued     1 tablet Oral 3 times weekly 08/19/19 1002 08/21/19 0950   08/16/19 2000  Pyrimethamine-Leucovorin 50-25 MG CAPS 50 mg  Status:  Discontinued     50 mg Oral Daily 08/15/19 1337 08/15/19 1357   08/16/19 2000  Pyrimethamine-Leucovorin 50-25 MG CAPS 50 mg  Status:  Discontinued     50 mg Oral Daily 08/15/19 1357 08/21/19 1312   08/15/19 2000  Pyrimethamine-Leucovorin 50-25 MG CAPS 200 mg  Status:  Discontinued     200 mg Oral  Once 08/15/19 1337 08/15/19 1357   08/15/19 2000  Pyrimethamine-Leucovorin 50-25 MG CAPS 200 mg     200 mg Oral  Once 08/15/19 1357 08/15/19 2114   08/15/19 1800  sulfaDIAZINE tablet 1,000 mg  Status:  Discontinued     1,000 mg Oral Every 6 hours 08/15/19 1337 08/20/19 1118   08/14/19 1000  demeclocycline (DECLOMYCIN) tablet 150 mg  Status:  Discontinued     150 mg Oral Every 12 hours 08/14/19 0848 08/14/19 0907   08/14/19 1000  demeclocycline (DECLOMYCIN) tablet 150 mg  Status:  Discontinued     150 mg Per Tube Every 12 hours 08/14/19 0907 08/19/19 1030   08/14/19 1000  fluconazole (DIFLUCAN) tablet 200 mg     200 mg Per Tube Daily 08/14/19 0907 08/18/19 1055   08/13/19 1015  fluconazole (DIFLUCAN) tablet 200 mg  Status:  Discontinued      200 mg Oral Daily 08/13/19 1009 08/14/19 0907   08/13/19 1000  penicillin g benzathine (BICILLIN LA) 1200000 UNIT/2ML injection 2.4 Million Units     2.4 Million Units Intramuscular  Once 08/08/19 1019 08/13/19 1222   08/12/19 1400  sulfamethoxazole-trimethoprim (BACTRIM) 272.48 mg in dextrose 5 % 500 mL IVPB  Status:  Discontinued     10 mg/kg/day  54.5 kg 344.7 mL/hr over 90 Minutes Intravenous Every 12 hours 08/12/19 0854 08/15/19 1337   08/11/19 1400  sulfamethoxazole-trimethoprim (BACTRIM) 400-80 MG per tablet 3 tablet  Status:  Discontinued     3 tablet Oral Every 8 hours 08/11/19 1349 08/12/19 0854   08/10/19 1000  abacavir-dolutegravir-lamiVUDine (TRIUMEQ) 600-50-300 MG per tablet 1 tablet  Status:  Discontinued     1 tablet Oral Daily 08/09/19 1514 08/12/19 0848   08/10/19 1000  sulfamethoxazole-trimethoprim (BACTRIM) 160 mg in dextrose 5 % 250 mL IVPB  Status:  Discontinued     160 mg 260 mL/hr over 60 Minutes Intravenous Daily 08/09/19 1514 08/11/19 1324   08/09/19 1730  metroNIDAZOLE (FLAGYL) IVPB 500 mg     500 mg 100 mL/hr over 60 Minutes Intravenous Every 8 hours 08/09/19 1636 08/13/19 1848   08/08/19 1000  rifampin (RIFADIN) 60 mg/mL oral suspension 600 mg  Status:  Discontinued     600 mg Per Tube Daily 08/07/19 1106 08/07/19 1319   08/08/19 1000  bictegravir-emtricitabine-tenofovir AF (BIKTARVY) 50-200-25 MG per tablet 1 tablet  Status:  Discontinued     1 tablet Oral Daily 08/07/19 1419 08/09/19 1514   08/07/19 1330  bictegravir-emtricitabine-tenofovir AF (BIKTARVY) 50-200-25 MG per tablet 1 tablet  Status:  Discontinued     1 tablet Oral Daily 08/07/19 1319 08/07/19 1419   08/07/19 1000  ethambutol (MYAMBUTOL) tablet 1,200 mg  Status:  Discontinued     1,200 mg Per Tube Daily 08/06/19 1423 08/07/19 1319   08/07/19 1000  isoniazid (NYDRAZID) tablet 300 mg  Status:  Discontinued     300 mg Per Tube Daily 08/06/19 1423 08/07/19 1319   08/07/19 1000  rifampin (RIFADIN) 60  mg/mL oral suspension 600 mg  Status:  Discontinued     600 mg Oral Daily 08/06/19 1423 08/07/19 1106   08/07/19 1000  sulfamethoxazole-trimethoprim (BACTRIM DS) 800-160 MG per tablet 1 tablet  Status:  Discontinued     1 tablet Per Tube Daily 08/06/19 1423 08/09/19 1514   08/07/19 1000  pyrazinamide tablet 1,500 mg  Status:  Discontinued     1,500 mg Per Tube Daily 08/06/19 1423 08/07/19 1319   08/03/19 1600  fluconazole (DIFLUCAN) IVPB 200 mg  Status:  Discontinued     200 mg 100 mL/hr over 60 Minutes Intravenous Every 24 hours 08/03/19 1548 08/13/19 1009   07/30/19 1700  rifampin (RIFADIN) capsule 600 mg  Status:  Discontinued     600 mg Oral Daily 07/30/19 1604 08/06/19 1423   07/30/19 1700  isoniazid (NYDRAZID) tablet 300 mg  Status:  Discontinued     300 mg Oral Daily 07/30/19 1604 08/06/19 1423   07/30/19 1700  pyrazinamide tablet 1,500 mg  Status:  Discontinued     1,500 mg Oral Daily 07/30/19 1604 08/06/19 1423   07/30/19 1700  ethambutol (MYAMBUTOL) tablet 1,200 mg  Status:  Discontinued     1,200 mg Oral Daily 07/30/19 1604 08/06/19 1423   07/29/19 1700  sulfamethoxazole-trimethoprim (BACTRIM DS) 800-160 MG per tablet 1 tablet  Status:  Discontinued     1 tablet Oral Daily 07/29/19 1601 08/06/19 1423   07/29/19 1700  ganciclovir (CYTOVENE) 285 mg in sodium chloride 0.9 % 100 mL IVPB  Status:  Discontinued     5 mg/kg  57 kg 100 mL/hr over 60 Minutes Intravenous Every 12 hours 07/29/19 1603 08/07/19 1526   07/29/19 1630  penicillin G potassium 12 Million Units in dextrose 5 % 250 mL IVPB  Status:  Discontinued     12 Million Units 250 mL/hr over 60 Minutes Intravenous Every 12 hours 07/29/19 1552 07/29/19 1559   07/29/19 1630  penicillin G potassium 12 Million Units in dextrose 5 % 500 mL continuous infusion     12 Million Units 41.7 mL/hr over 12 Hours Intravenous Every 12 hours 07/29/19 1559 08/12/19 2359       Objective: Vitals:   08/21/19 2343 08/22/19 0333 08/22/19  0558 08/22/19 0729  BP: 99/65 99/64    Pulse: 90 96 84 91  Resp: (!) 23 (!) 27 19 (!) 23  Temp: 97.8 F (36.6 C) 98.2 F (36.8 C)    TempSrc: Axillary Axillary    SpO2: 100% 100%    Weight:      Height:        Intake/Output Summary (Last 24 hours) at 08/22/2019 1029 Last data filed at 08/22/2019 0334 Gross per  24 hour  Intake 300 ml  Output 850 ml  Net -550 ml   Filed Weights   08/18/19 0500 08/20/19 0300 08/21/19 0351  Weight: 47.2 kg 50.9 kg 47.1 kg    Examination: General exam: Appears calm and comfortable  Respiratory system: Clear to auscultation. Respiratory effort normal.  On room air Cardiovascular system: S1 & S2 heard, RRR. No pedal edema. Gastrointestinal system: Abdomen is nondistended, soft and nontender. Normal bowel sounds heard. Central nervous system: Alert,, follows simple commands to move his extremities Extremities: Symmetric in appearance bilaterally  Skin: No rashes, lesions or ulcers on exposed skin  Psychiatry: Stable   Data Reviewed: I have personally reviewed following labs and imaging studies  CBC: Recent Labs  Lab 08/16/19 0441 08/18/19 0217 08/20/19 0249  WBC 2.2* 3.2* 3.1*  NEUTROABS  --   --  1.9  HGB 14.9 14.7 14.0  HCT 43.0 43.3 42.3  MCV 86.2 88.2 90.8  PLT 339 350 288   Basic Metabolic Panel: Recent Labs  Lab 08/18/19 0217  08/19/19 0215  08/19/19 1953 08/20/19 0249 08/20/19 0949 08/21/19 1055 08/22/19 0255  NA 132*   < > 137   < > 139 141 143 144 146*  K 4.8  --  4.3  --   --  4.4  --  3.7 4.8  CL 92*  --  99  --   --  104  --  110 110  CO2 28  --  27  --   --  28  --  25 27  GLUCOSE 149*  --  152*  --   --  153*  --  149* 134*  BUN 67*  --  69*  --   --  58*  --  55* 46*  CREATININE 1.69*  --  1.63*  --   --  1.50*  --  1.40* 1.11  CALCIUM 9.6  --  9.5  --   --  9.4  --  9.2 9.2  MG  --   --   --   --   --  2.8*  --   --   --    < > = values in this interval not displayed.   GFR: Estimated Creatinine  Clearance: 61.3 mL/min (by C-G formula based on SCr of 1.11 mg/dL). Liver Function Tests: Recent Labs  Lab 08/20/19 0249  AST 39  ALT 26  ALKPHOS 51  BILITOT 0.6  PROT 8.7*  ALBUMIN 3.2*   No results for input(s): LIPASE, AMYLASE in the last 168 hours. No results for input(s): AMMONIA in the last 168 hours. Coagulation Profile: No results for input(s): INR, PROTIME in the last 168 hours. Cardiac Enzymes: No results for input(s): CKTOTAL, CKMB, CKMBINDEX, TROPONINI in the last 168 hours. BNP (last 3 results) No results for input(s): PROBNP in the last 8760 hours. HbA1C: No results for input(s): HGBA1C in the last 72 hours. CBG: Recent Labs  Lab 08/21/19 1829 08/21/19 2012 08/21/19 2348 08/22/19 0327 08/22/19 0947  GLUCAP 87 151* 105* 99 91   Lipid Profile: No results for input(s): CHOL, HDL, LDLCALC, TRIG, CHOLHDL, LDLDIRECT in the last 72 hours. Thyroid Function Tests: No results for input(s): TSH, T4TOTAL, FREET4, T3FREE, THYROIDAB in the last 72 hours. Anemia Panel: No results for input(s): VITAMINB12, FOLATE, FERRITIN, TIBC, IRON, RETICCTPCT in the last 72 hours. Sepsis Labs: No results for input(s): PROCALCITON, LATICACIDVEN in the last 168 hours.  No results found for this or any previous visit (from  the past 240 hour(s)).    Radiology Studies: Mr Laqueta Jean Wo Contrast  Result Date: 08/21/2019 CLINICAL DATA:  Dizziness, weakness and headache. History of HIV. EXAM: MRI HEAD WITHOUT AND WITH CONTRAST TECHNIQUE: Multiplanar, multiecho pulse sequences of the brain and surrounding structures were obtained without and with intravenous contrast. CONTRAST:  4.74mL GADAVIST GADOBUTROL 1 MMOL/ML IV SOLN COMPARISON:  None. FINDINGS: BRAIN: There is multifocal abnormal diffusion restriction within both cerebral hemispheres, greatest along the cortices of the medial frontal lobes. There is widespread hyperintense T2-weighted signal abnormality, greatest in the cerebellum. These  findings are new compared to the prior MRI. There is abnormal contrast enhancement in the bilateral frontal and left temporal lobes. There is no extra-axial collection. No midline shift or other mass effect. No hydrocephalus. VASCULAR: The major intracranial arterial and venous sinus flow voids are normal. Susceptibility-sensitive sequences show no chronic microhemorrhage or superficial siderosis. SKULL AND UPPER CERVICAL SPINE: Calvarial bone marrow signal is normal. There is no skull base mass. The visualized upper cervical spine and soft tissues are normal. SINUSES/ORBITS: There are no fluid levels or advanced mucosal thickening. The mastoid air cells and middle ear cavities are free of fluid. The orbits are normal. IMPRESSION: 1. Multifocal signal abnormality and abnormal contrast enhancement within both cerebral and cerebellar hemispheres. In this clinical scenario, the findings are most consistent with infectious cerebritis, and the pattern is compatible with cerebral toxoplasmosis. 2. No discrete abscess, though the largest area of contrast enhancement in the left frontal lobe may be in the early stages of abscess formation. 3. No hemorrhage or mass effect. Electronically Signed   By: Deatra Robinson M.D.   On: 08/21/2019 22:33      Scheduled Meds: . enoxaparin (LOVENOX) injection  40 mg Subcutaneous Q24H  . famotidine  20 mg Per Tube Daily  . feeding supplement (PRO-STAT SUGAR FREE 64)  30 mL Per Tube Daily  . free water  100 mL Per Tube Q8H  . insulin aspart  0-9 Units Subcutaneous Q4H  . magic mouthwash w/lidocaine  10 mL Oral TID AC & HS  . mouth rinse  15 mL Mouth Rinse BID  . mirtazapine  15 mg Per Tube QHS  . multivitamin with minerals  1 tablet Per Tube Daily  . Pyrimethamine-Leucovorin  50 mg Per Tube Daily  . sulfaDIAZINE  1,000 mg Per Tube Q6H  . [START ON 08/23/2019] sulfamethoxazole-trimethoprim  20 mL Per Tube 3 times weekly   Continuous Infusions: . chlorproMAZINE (THORAZINE)  IV 12.5 mg (08/09/19 1135)  . feeding supplement (OSMOLITE 1.5 CAL) 1,000 mL (08/22/19 0935)  . fluconazole (DIFLUCAN) IV 400 mg (08/21/19 1235)     LOS: 24 days      Time spent: 25 minutes   Noralee Stain, DO [facility] 08/22/2019, 10:29 AM   Available via Epic secure chat 7am-7pm After these hours, please refer to coverage provider listed on [email or web address]

## 2019-08-22 NOTE — Progress Notes (Signed)
Spoke with Patient's mother, [name] to give her an  updates from the shift. Patient's night was uneventful other than his extreme lethargy at the beginning of the shift due to receiving sedative for MRI. He is currently Alert and able to communicate nonverbally the desire to have water. Will continue to monitor for patient safety and comfort.

## 2019-08-22 NOTE — Plan of Care (Signed)
  Problem: Activity: Goal: Risk for activity intolerance will decrease Outcome: Progressing Note: Pt sat in chair for 2 hours today. Is moving LUE more than yesterday. Able to communicate more efficiently with "thumbs up and thumbs down". Pt was able to move legs more on command to aid with ambulation to/from bed + chair. Mother at bedside, appreciates seeing small progress. Pt smiled when RN stated how proud he should be. Will continue to monitor.    Problem: Nutrition: Goal: Adequate nutrition will be maintained Outcome: Progressing Note: TF infusing, no complications. Pt continues to gesture for water. SLP following.

## 2019-08-23 ENCOUNTER — Inpatient Hospital Stay (HOSPITAL_COMMUNITY): Payer: Medicaid Other

## 2019-08-23 LAB — BASIC METABOLIC PANEL
Anion gap: 9 (ref 5–15)
BUN: 40 mg/dL — ABNORMAL HIGH (ref 6–20)
CO2: 23 mmol/L (ref 22–32)
Calcium: 9.3 mg/dL (ref 8.9–10.3)
Chloride: 113 mmol/L — ABNORMAL HIGH (ref 98–111)
Creatinine, Ser: 1.05 mg/dL (ref 0.61–1.24)
GFR calc Af Amer: >60 mL/min (ref >60)
GFR calc non Af Amer: >60 mL/min (ref >60)
Glucose, Bld: 105 mg/dL — ABNORMAL HIGH (ref 70–99)
Potassium: 4.3 mmol/L (ref 3.5–5.1)
Sodium: 145 mmol/L (ref 135–145)

## 2019-08-23 LAB — GLUCOSE, CAPILLARY
Glucose-Capillary: 106 mg/dL — ABNORMAL HIGH (ref 70–99)
Glucose-Capillary: 108 mg/dL — ABNORMAL HIGH (ref 70–99)
Glucose-Capillary: 114 mg/dL — ABNORMAL HIGH (ref 70–99)
Glucose-Capillary: 120 mg/dL — ABNORMAL HIGH (ref 70–99)
Glucose-Capillary: 141 mg/dL — ABNORMAL HIGH (ref 70–99)

## 2019-08-23 NOTE — Progress Notes (Signed)
Palliative: Conference with bedside nursing staff related to patient condition, needs. Mr. Crass continue to have poor long term prognosis.  He will have swallow study later today.   Goals are set, PMT to shadow.   Plan:   Continue to treat the treatable, continue to support patient and family.   No charge  Quinn Axe, NP Palliative Medicine Team Team Phone # [phone] Greater than 50% of this time was spent counseling and coordinating care related to the above assessment and plan

## 2019-08-23 NOTE — Progress Notes (Signed)
Patient via bed to radiology for swallow screen

## 2019-08-23 NOTE — Progress Notes (Signed)
PROGRESS NOTE    Arthur [name]  EXB:[id number] DOB: 03/08/1981 DOA: 07/28/2019 PCP: Randall HissVan Dam, Cornelius N, MD     Brief Narrative:  Arthur [name] is a 38 year old male with past medical history significant for HIV, substance abuse, depression and GERD who presented to the emergency department on 11/1 with chief complaint of dizziness, weakness and intermittent headache. He has not taken any of his HIV medications for about 1 year due to affordability issue. MRI brain done in ED showed widespread areas of abnormal low level restricted diffusion and postcontrast enhancement throughout both cerebral hemispheres, predominantly infratentorial, with the dominant abnormality in the left anterior frontal white matter consistent with an opportunistic infection, such as toxoplasmosis, tuberculosis or fungal cerebritis/meningitis, or parasitic infection. Patient was admitted to hospital service and ID was consulted. He then revealed that he has lost his vision in his right eye. ID started him on penicillin and ganciclovir for possible CMV retinitis versus synovitis retinitis and ophthalmology consulted.  11/3: status post lumbar puncture by neurology 11/15: transferred to ICU for tx of severe hyponatremia/SIADH for tx with hypertonic saline as his mental status was worse, and respiratory status was declining 11/16: transferred to SDU  New events last 24 hours / Subjective: No acute events overnight.   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 RIPEtx stopped -Appreciate ID. Completed PCN -Continue pyrimethamin-leucovorin, sulfadiazine   Acute toxic metabolic encephalopathy, multifactorial including CNS infection and SIADH -Repeat CT head without new process, evolving lesions of cerebellum and left frontal lobe -MRI brain with multifocal signal abnormality and abnormal contrast enhancement, most consistent with infectious cerebritis, compatible with cerebral toxoplasmosis, no discrete abscess -Stable   Right eye blindness, optic retinitis/neurititis -Mainly concerned between syphilis and CMV-butCMV PCR negative in the CNS -Appreciate ophthalmology  -Received steroid injection 08/02/2019 -Repeat MRIbrain and orbits show improvement of brain lesions and optic neurititis, retinitis  Severehyponatremia -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 Na 145 today   AKI -Resolved  Leukopenia/immunocompromised in the setting of untreated HIV -Fluconazole for thrush  Untreated HIV -ID following, off tx for now  -Bactrim for ppx   Dysphagia Severe protein calorie malnutrition -Seen by SLP -Tube feeding managed by dietitian  -Discussed PEG tube with mom; she is hesitant to agree to a procedure as she sees that he is improving.  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 Seen by palliative care. Currently recommending continuing aggressive measures   DVT prophylaxis: Lovenox Code Status: Full Family Communication: None at bedside, discussed with mom over the phone today  Disposition Plan: Patient's mom  refusing SNF placement.  CIR evaluation to follow   Antimicrobials:  Anti-infectives (From admission, onward)   Start  Dose/Rate Route Frequency Ordered Stop   08/23/19 0900  sulfamethoxazole-trimethoprim (BACTRIM) 200-40 MG/5ML suspension 20 mL  Status:  Discontinued     20 mL Oral 3 times weekly 08/21/19 0950 08/21/19 1311   08/23/19 0900  sulfamethoxazole-trimethoprim (BACTRIM) 200-40 MG/5ML suspension 20 mL     20 mL Per Tube 3 times weekly 08/21/19 1311     08/21/19 2000  Pyrimethamine-Leucovorin 50-25 MG CAPS 50 mg     50 mg Per Tube Daily 08/21/19 1312     08/21/19 1100  fluconazole (DIFLUCAN) IVPB 400 mg     400 mg 100 mL/hr over 120 Minutes Intravenous Every 24 hours 08/21/19 1009     08/20/19 1200  sulfaDIAZINE tablet 1,000 mg     1,000 mg Per Tube Every 6 hours 08/20/19 1118     08/19/19 1015  sulfamethoxazole-trimethoprim (BACTRIM DS) 800-160 MG per tablet 1 tablet  Status:  Discontinued     1 tablet Oral 3 times weekly 08/19/19 1002 08/21/19 0950   08/16/19 2000  Pyrimethamine-Leucovorin 50-25 MG CAPS 50 mg  Status:  Discontinued     50 mg Oral Daily 08/15/19 1337 08/15/19 1357   08/16/19 2000  Pyrimethamine-Leucovorin 50-25 MG CAPS 50 mg  Status:  Discontinued     50 mg Oral Daily 08/15/19 1357 08/21/19 1312   08/15/19 2000  Pyrimethamine-Leucovorin 50-25 MG CAPS 200 mg  Status:  Discontinued     200 mg Oral  Once 08/15/19 1337 08/15/19 1357   08/15/19 2000  Pyrimethamine-Leucovorin 50-25 MG CAPS 200 mg     200 mg Oral  Once 08/15/19 1357 08/15/19 2114   08/15/19 1800  sulfaDIAZINE tablet 1,000 mg  Status:  Discontinued     1,000 mg Oral Every 6 hours 08/15/19 1337 08/20/19 1118   08/14/19 1000  demeclocycline (DECLOMYCIN) tablet 150 mg  Status:  Discontinued     150 mg Oral Every 12 hours 08/14/19 0848 08/14/19 0907   08/14/19 1000  demeclocycline (DECLOMYCIN) tablet 150 mg  Status:  Discontinued     150 mg Per Tube Every 12 hours 08/14/19 0907 08/19/19 1030    08/14/19 1000  fluconazole (DIFLUCAN) tablet 200 mg     200 mg Per Tube Daily 08/14/19 0907 08/18/19 1055   08/13/19 1015  fluconazole (DIFLUCAN) tablet 200 mg  Status:  Discontinued     200 mg Oral Daily 08/13/19 1009 08/14/19 0907   08/13/19 1000  penicillin g benzathine (BICILLIN LA) 1200000 UNIT/2ML injection 2.4 Million Units     2.4 Million Units Intramuscular  Once 08/08/19 1019 08/13/19 1222   08/12/19 1400  sulfamethoxazole-trimethoprim (BACTRIM) 272.48 mg in dextrose 5 % 500 mL IVPB  Status:  Discontinued     10 mg/kg/day  54.5 kg 344.7 mL/hr over 90 Minutes Intravenous Every 12 hours 08/12/19 0854 08/15/19 1337   08/11/19 1400  sulfamethoxazole-trimethoprim (BACTRIM) 400-80 MG per tablet 3 tablet  Status:  Discontinued     3 tablet Oral Every 8 hours 08/11/19 1349 08/12/19 0854   08/10/19 1000  abacavir-dolutegravir-lamiVUDine (TRIUMEQ) 600-50-300 MG per tablet 1 tablet  Status:  Discontinued     1 tablet Oral Daily 08/09/19 1514 08/12/19 0848   08/10/19 1000  sulfamethoxazole-trimethoprim (BACTRIM) 160 mg in dextrose 5 % 250 mL IVPB  Status:  Discontinued     160 mg 260 mL/hr over 60 Minutes Intravenous Daily 08/09/19 1514 08/11/19 1324   08/09/19 1730  metroNIDAZOLE (FLAGYL) IVPB 500 mg     500 mg 100 mL/hr over 60  Minutes Intravenous Every 8 hours 08/09/19 1636 08/13/19 1848   08/08/19 1000  rifampin (RIFADIN) 60 mg/mL oral suspension 600 mg  Status:  Discontinued     600 mg Per Tube Daily 08/07/19 1106 08/07/19 1319   08/08/19 1000  bictegravir-emtricitabine-tenofovir AF (BIKTARVY) 50-200-25 MG per tablet 1 tablet  Status:  Discontinued     1 tablet Oral Daily 08/07/19 1419 08/09/19 1514   08/07/19 1330  bictegravir-emtricitabine-tenofovir AF (BIKTARVY) 50-200-25 MG per tablet 1 tablet  Status:  Discontinued     1 tablet Oral Daily 08/07/19 1319 08/07/19 1419   08/07/19 1000  ethambutol (MYAMBUTOL) tablet 1,200 mg  Status:  Discontinued     1,200 mg Per Tube Daily 08/06/19  1423 08/07/19 1319   08/07/19 1000  isoniazid (NYDRAZID) tablet 300 mg  Status:  Discontinued     300 mg Per Tube Daily 08/06/19 1423 08/07/19 1319   08/07/19 1000  rifampin (RIFADIN) 60 mg/mL oral suspension 600 mg  Status:  Discontinued     600 mg Oral Daily 08/06/19 1423 08/07/19 1106   08/07/19 1000  sulfamethoxazole-trimethoprim (BACTRIM DS) 800-160 MG per tablet 1 tablet  Status:  Discontinued     1 tablet Per Tube Daily 08/06/19 1423 08/09/19 1514   08/07/19 1000  pyrazinamide tablet 1,500 mg  Status:  Discontinued     1,500 mg Per Tube Daily 08/06/19 1423 08/07/19 1319   08/03/19 1600  fluconazole (DIFLUCAN) IVPB 200 mg  Status:  Discontinued     200 mg 100 mL/hr over 60 Minutes Intravenous Every 24 hours 08/03/19 1548 08/13/19 1009   07/30/19 1700  rifampin (RIFADIN) capsule 600 mg  Status:  Discontinued     600 mg Oral Daily 07/30/19 1604 08/06/19 1423   07/30/19 1700  isoniazid (NYDRAZID) tablet 300 mg  Status:  Discontinued     300 mg Oral Daily 07/30/19 1604 08/06/19 1423   07/30/19 1700  pyrazinamide tablet 1,500 mg  Status:  Discontinued     1,500 mg Oral Daily 07/30/19 1604 08/06/19 1423   07/30/19 1700  ethambutol (MYAMBUTOL) tablet 1,200 mg  Status:  Discontinued     1,200 mg Oral Daily 07/30/19 1604 08/06/19 1423   07/29/19 1700  sulfamethoxazole-trimethoprim (BACTRIM DS) 800-160 MG per tablet 1 tablet  Status:  Discontinued     1 tablet Oral Daily 07/29/19 1601 08/06/19 1423   07/29/19 1700  ganciclovir (CYTOVENE) 285 mg in sodium chloride 0.9 % 100 mL IVPB  Status:  Discontinued     5 mg/kg  57 kg 100 mL/hr over 60 Minutes Intravenous Every 12 hours 07/29/19 1603 08/07/19 1526   07/29/19 1630  penicillin G potassium 12 Million Units in dextrose 5 % 250 mL IVPB  Status:  Discontinued     12 Million Units 250 mL/hr over 60 Minutes Intravenous Every 12 hours 07/29/19 1552 07/29/19 1559   07/29/19 1630  penicillin G potassium 12 Million Units in dextrose 5 % 500 mL  continuous infusion     12 Million Units 41.7 mL/hr over 12 Hours Intravenous Every 12 hours 07/29/19 1559 08/12/19 2359       Objective: Vitals:   08/22/19 1800 08/22/19 1900 08/23/19 0400 08/23/19 0601  BP: 99/73 100/78 107/75   Pulse: 76  (!) 52   Resp: (!) 24  18   Temp:  97.8 F (36.6 C)    TempSrc:  Axillary    SpO2: 95%  97%   Weight:    50.9 kg  Height:  Intake/Output Summary (Last 24 hours) at 08/23/2019 0937 Last data filed at 08/23/2019 0100 Gross per 24 hour  Intake -  Output 650 ml  Net -650 ml   Filed Weights   08/20/19 0300 08/21/19 0351 08/23/19 0601  Weight: 50.9 kg 47.1 kg 50.9 kg    Examination: General exam: Appears calm and comfortable  Respiratory system: Clear to auscultation anteriorly. Respiratory effort normal. Cardiovascular system: S1 & S2 heard, RRR. No pedal edema. Gastrointestinal system: Abdomen is nondistended, soft and nontender. Normal bowel sounds heard. Central nervous system: Alert  Extremities: Symmetric in appearance bilaterally  Skin: No rashes, lesions or ulcers on exposed skin     Data Reviewed: I have personally reviewed following labs and imaging studies  CBC: Recent Labs  Lab 08/18/19 0217 08/20/19 0249  WBC 3.2* 3.1*  NEUTROABS  --  1.9  HGB 14.7 14.0  HCT 43.3 42.3  MCV 88.2 90.8  PLT 350 176   Basic Metabolic Panel: Recent Labs  Lab 08/19/19 0215  08/20/19 0249 08/20/19 0949 08/21/19 1055 08/22/19 0255 08/23/19 0229  NA 137   < > 141 143 144 146* 145  K 4.3  --  4.4  --  3.7 4.8 4.3  CL 99  --  104  --  110 110 113*  CO2 27  --  28  --  25 27 23   GLUCOSE 152*  --  153*  --  149* 134* 105*  BUN 69*  --  58*  --  55* 46* 40*  CREATININE 1.63*  --  1.50*  --  1.40* 1.11 1.05  CALCIUM 9.5  --  9.4  --  9.2 9.2 9.3  MG  --   --  2.8*  --   --   --   --    < > = values in this interval not displayed.   GFR: Estimated Creatinine Clearance: 70 mL/min (by C-G formula based on SCr of 1.05  mg/dL). Liver Function Tests: Recent Labs  Lab 08/20/19 0249  AST 39  ALT 26  ALKPHOS 51  BILITOT 0.6  PROT 8.7*  ALBUMIN 3.2*   No results for input(s): LIPASE, AMYLASE in the last 168 hours. No results for input(s): AMMONIA in the last 168 hours. Coagulation Profile: No results for input(s): INR, PROTIME in the last 168 hours. Cardiac Enzymes: No results for input(s): CKTOTAL, CKMB, CKMBINDEX, TROPONINI in the last 168 hours. BNP (last 3 results) No results for input(s): PROBNP in the last 8760 hours. HbA1C: No results for input(s): HGBA1C in the last 72 hours. CBG: Recent Labs  Lab 08/22/19 1113 08/22/19 1631 08/22/19 1958 08/22/19 2350 08/23/19 0346  GLUCAP 112* 109* 118* 141* 108*   Lipid Profile: No results for input(s): CHOL, HDL, LDLCALC, TRIG, CHOLHDL, LDLDIRECT in the last 72 hours. Thyroid Function Tests: No results for input(s): TSH, T4TOTAL, FREET4, T3FREE, THYROIDAB in the last 72 hours. Anemia Panel: No results for input(s): VITAMINB12, FOLATE, FERRITIN, TIBC, IRON, RETICCTPCT in the last 72 hours. Sepsis Labs: No results for input(s): PROCALCITON, LATICACIDVEN in the last 168 hours.  No results found for this or any previous visit (from the past 240 hour(s)).    Radiology Studies: Mr Jeri Cos Wo Contrast  Result Date: 08/21/2019 CLINICAL DATA:  Dizziness, weakness and headache. History of HIV. EXAM: MRI HEAD WITHOUT AND WITH CONTRAST TECHNIQUE: Multiplanar, multiecho pulse sequences of the brain and surrounding structures were obtained without and with intravenous contrast. CONTRAST:  4.59mL GADAVIST GADOBUTROL 1 MMOL/ML IV  SOLN COMPARISON:  None. FINDINGS: BRAIN: There is multifocal abnormal diffusion restriction within both cerebral hemispheres, greatest along the cortices of the medial frontal lobes. There is widespread hyperintense T2-weighted signal abnormality, greatest in the cerebellum. These findings are new compared to the prior MRI. There is  abnormal contrast enhancement in the bilateral frontal and left temporal lobes. There is no extra-axial collection. No midline shift or other mass effect. No hydrocephalus. VASCULAR: The major intracranial arterial and venous sinus flow voids are normal. Susceptibility-sensitive sequences show no chronic microhemorrhage or superficial siderosis. SKULL AND UPPER CERVICAL SPINE: Calvarial bone marrow signal is normal. There is no skull base mass. The visualized upper cervical spine and soft tissues are normal. SINUSES/ORBITS: There are no fluid levels or advanced mucosal thickening. The mastoid air cells and middle ear cavities are free of fluid. The orbits are normal. IMPRESSION: 1. Multifocal signal abnormality and abnormal contrast enhancement within both cerebral and cerebellar hemispheres. In this clinical scenario, the findings are most consistent with infectious cerebritis, and the pattern is compatible with cerebral toxoplasmosis. 2. No discrete abscess, though the largest area of contrast enhancement in the left frontal lobe may be in the early stages of abscess formation. 3. No hemorrhage or mass effect. Electronically Signed   By: Deatra Robinson M.D.   On: 08/21/2019 22:33      Scheduled Meds: . enoxaparin (LOVENOX) injection  40 mg Subcutaneous Q24H  . famotidine  20 mg Per Tube Daily  . feeding supplement (PRO-STAT SUGAR FREE 64)  30 mL Per Tube Daily  . free water  100 mL Per Tube Q8H  . insulin aspart  0-9 Units Subcutaneous Q4H  . magic mouthwash w/lidocaine  10 mL Oral TID AC & HS  . mouth rinse  15 mL Mouth Rinse BID  . mirtazapine  15 mg Per Tube QHS  . multivitamin with minerals  1 tablet Per Tube Daily  . Pyrimethamine-Leucovorin  50 mg Per Tube Daily  . sulfaDIAZINE  1,000 mg Per Tube Q6H  . sulfamethoxazole-trimethoprim  20 mL Per Tube 3 times weekly  . terbinafine   Topical Daily   Continuous Infusions: . chlorproMAZINE (THORAZINE) IV 12.5 mg (08/09/19 1135)  . feeding  supplement (OSMOLITE 1.5 CAL) 1,000 mL (08/22/19 2054)  . fluconazole (DIFLUCAN) IV 400 mg (08/22/19 1231)     LOS: 25 days      Time spent: 25 minutes   Noralee Stain, DO [facility] 08/23/2019, 9:37 AM   Available via Epic secure chat 7am-7pm After these hours, please refer to coverage provider listed on [email or web address]

## 2019-08-23 NOTE — Progress Notes (Signed)
Speech Language Pathology Treatment: Dysphagia  Patient Details Name: Arthur [name] MRN: [id number] DOB: [birth year] Today's Date: 08/23/2019 Time: 3295-1884 SLP Time Calculation (min) (ACUTE ONLY): 48 min  Assessment / Plan / Recommendation Clinical Impression  SLP spoke with pt's mother, Arthur [name], via phone for about 30 minutes to discuss Arthur [name]'s swallow function and options moving forward. She shares that Arthur [name] has told her that he does not want a PEG, so although her decision is not made, she does not want to go against his wishes either. She has been coaching him to try to keep his head up higher and has seen a decrease in drooling since he has shown her more head control. I discussed my concerns about safety as well as adequate oral intake, and that although head posture could be playing a role, there are other things contributing too. We discussed the option of MBS, but that pt will need to swallow before this test will be of benefit. Unfortunately, she is not able to come early enough in the day to be present for a study. I also reinforced with her that even if they were to pursue a PEG, it does not mean that dysphagia treatment stops.   I saw Arthur [name] after this, and per Arthur [name]'s request, I reinforced education and plan with him as well and cued him to keep his head upright. He does have drooling and oral holding of saliva present at baseline, for which he used his yankauer with Mod A from SLP to clear this. Even with his head up, he has inadequate labial seal and anteriorly loses thin liquids boluses unless siphoned via straw further back into his oral cavity. There is a prolonged time after this is presented and SLP provides Mod cues to initiate a swallow. When I asked Arthur [name] if he thought he had swallowed he said yes, even though there was no movement to hyolaryngeal palpation. He did swallow x2 to command. Pt also had significant difficulty with oral transit of a small amount of jello (given because  Arthur [name] said he does not like our applesauce or pudding). He spit out half of the bolus. The remaining half sat in his mouth for a prolonged time. Again when asked if he swallowed it and if he cleared his mouth he said yes, but no hyolaryngeal palpation was noted and when SLP suctioned his mouth, I removed a moderate amount of secretions that had built back up in his mouth as well as the remaining jello.   Pt continues to present with a significant oropharyngeal dysphagia, but given the challenges faced with decision making and that pt could be cued to swallow today, I think we should go ahead and proceed with MBS to get a clearer picture of his swallow function.      HPI HPI: Pt is a 38 y.o. male admitted with neurosyphillis, CNS toxoplasmosis. MRI 11/1 showed widespread areas concerning for infection, with most dominant abnormality in the L anterior frontal white matter. Hospital course was complicated by hyponatremia and respiratory decline, transferred to ICU on 11/14 but not requiring intubation. Multiple CXRs this admission without acute findings. PMH: substance abuse, HIV, GERD, depression, anxiety, anemia      SLP Plan  MBS       Recommendations  Diet recommendations: NPO Medication Administration: Via alternative means                Oral Care Recommendations: Oral care QID Follow up Recommendations: Inpatient Rehab SLP Visit Diagnosis: Dysphagia, oropharyngeal phase (  R13.12) Plan: MBS       GO                Arthur [name] Arthur [name] 08/23/2019, 11:45 AM  Arthur [name], M.A. [place] Acute Environmental education officer [phone] Office [phone]

## 2019-08-23 NOTE — Progress Notes (Signed)
Modified Barium Swallow Progress Note  Patient Details  Name: Arthur [name] MRN: [id number] Date of Birth: [birth year]  Today's Date: 08/23/2019  Modified Barium Swallow completed.  Full report located under Chart Review in the Imaging Section.  Brief recommendations include the following:  Clinical Impression  Pt presents with a severe oral dysphagia with resultant aspiration of thin liquids. Pt has incomplete labial seal, resulting in anterior bolus loss even when his head is in an upright position. SLP provided cues for increased lip seal and tried to administer boluses as far back in his oral cavity as possible in light of reduced mandibular opening. Although pt does have lingual movement, he does not have any posterior propulsion. In fact, at times when he was trying to transit the bolus posteriorly, his lingual movement would push the bolus more anteriorly to his lips and anterior sulcus. SLP provided larger boluses and various delivery methods to assist. Attempted reclining the chair slightly to allow for gravity to assist him with oral transit. Although this did help a small amount of thin liquid get to his pharynx, the liquid spilled passively and directly into his airway without eliciting a swallow. Pt did have a weak and congested cough that occurred spontaneously, suggestive of sensation of aspiration. Aside from the small amount that was aspirated, the remaining boluses, regardless of consistency, were either spilled passively out of his mouth or removed via suction. SLP reviewed results with patient and per his request, also called to share results with his mother. She would like to be present for future sessions whenever possible. I shared that we will try to coordinate this for this upcoming week. She is motivated to help him so I encouraged her to provide cues focusing on secretion management at this time, including trying to swallow some of his saliva, closing his lips to keep it in  his mouth, and trying to cough and use the yankuaer himself.  Will continue to follow and progress as able, but at the moment recommend that pt remain NPO with frequent oral care.    Swallow Evaluation Recommendations   SLP Diet Recommendations: NPO   Medication Administration: Via alternative means     Oral Care Recommendations: Oral care QID   Other Recommendations: Have oral suction available    [name] 08/23/2019,3:22 PM   Pollyann Glen, M.A. [place] Acute Environmental education officer [phone] Office [phone]

## 2019-08-23 NOTE — Progress Notes (Signed)
Placed sticky note.  Mom called and requested that the doctor contact her with and update on the last MRI. Mom also requested a meeting the Speech therapist.

## 2019-08-23 NOTE — Consult Note (Signed)
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 Alvino Chapel   HPI: Arthur [name] is a 38 y.o. male with history of HIV, depression, anxiety, substance abuse, recent positive RPR,  who was admitted on 07/28/19 with dizziness, headaches and weakness. Patient had not taken his HIV meds X 1 year and reported loss of vision in right eye. and MRI brain done revealing widespread  areas of diffusion with abnormal contrast both cerebral hemispheres predominately in left anterior frontal matter concerning for opportunistic infection as well as premature generalized atrophy. 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 ID following for input on treatment and on pyrimethamine-Leucovorin and sulfadiazine with fluconazole for oral candidiasis. Follow up MRI showing response to treatment.  PEG recommended due to severe protein calorie malnutrition.  Palliative care consulted to discuss goals of care--mother has elected on aggressive treatment and has refused SNF.   Patient limited by significant debility and able to communicate with multimodal cues and follow 1 step commands with 75% accuracy. CIR recommended due to functional decline.    ROS: Unable to obtain due to cognitive/mental status issues.   Past Medical History:  Diagnosis Date   Anemia    Anxiety    Depression    GERD (gastroesophageal reflux disease)    HIV infection (HCC)    Substance abuse (HCC)    Past Surgical History:  Procedure Laterality Date   TOOTH EXTRACTION      Family History  Problem Relation Age of Onset   Diabetes Neg Hx    CAD Neg Hx    Hypertension Neg Hx     Social History:  reports that he has been smoking. He has a 6.80 pack-year smoking history. He has never used smokeless tobacco. He reports current drug use. Drug:  Marijuana. He reports that he does not drink alcohol.    Allergies: No Known Allergies    Medications Prior to Admission  Medication Sig Dispense Refill   ondansetron (ZOFRAN ODT) 8 MG disintegrating tablet Take 1 tablet (8 mg total) by mouth every 8 (eight) hours as needed for nausea or vomiting. 20 tablet 0   ritonavir (NORVIR) 100 MG TABS tablet Take 1 tablet (100 mg total) by mouth daily. (Patient not taking: Reported on 01/22/2018) 30 tablet 5   TRUVADA 200-300 MG per tablet TAKE 1 TABLET BY MOUTH DAILY (Patient not taking: Reported on 01/22/2018) 30 tablet 0    Home: Home Living Family/patient expects to be discharged to:: Private residence Living Arrangements: Other relatives Available Help at Discharge: Family, Available PRN/intermittently Type of Home: Apartment Home Access: Level entry Home Layout: One level Bathroom Shower/Tub: Nurse, adult Accessibility: Yes Home Equipment: None  Functional History: Prior Function Level of Independence: Independent Functional Status:  Mobility: Bed Mobility Overal bed mobility: Needs Assistance Bed Mobility: Supine to Sit Supine to sit: Max assist, +2 for physical assistance, +2 for safety/equipment Sit to supine: Total assist, +2 for physical assistance General bed mobility comments: assist for mgmt of LEs/trunk; increased time required to initate and process tasks  Transfers Overall transfer level: Needs assistance Equipment used: Ambulation equipment used Transfer via Lift Equipment: Stedy Transfers: Sit to/from Stand, Pharmacologist Sit to Stand: Max assist, Mod assist, +2 physical assistance, +2 safety/equipment Stand pivot transfers: Total assist, +2 physical assistance, +2 safety/equipment General transfer comment: max assist +2 from EOB  into steady, min-mod assist +2 standing from elevated Stedy seat and tactile cues for L hip extension; pivot to recliner using stedy; pt initiated RUE grip on Stedy  well with cues, requiring assist to manage LLE Ambulation/Gait Ambulation/Gait assistance: +2 physical assistance, Max assist, +2 safety/equipment Gait Distance (Feet): (4-5) Assistive device: Rolling walker (2 wheeled) Gait Pattern/deviations: Step-to pattern, Decreased step length - right, Decreased step length - left, Narrow base of support, Scissoring General Gait Details: assistance required for balance/weight shifting and LE placement; pt with tendency to scissor adn with R lateral bias Gait velocity: decr Gait velocity interpretation: <1.31 ft/sec, indicative of household ambulator    ADL: ADL Overall ADL's : Needs assistance/impaired Eating/Feeding: NPO Grooming: Wash/dry face, Set up, Bed level, Moderate assistance Grooming Details (indicate cue type and reason): setup with wet washcloth to wipe lips with supervision; seated in recliner mod assist to suction mouth to support R elbow to bring hand to mouth Upper Body Bathing: Moderate assistance, Bed level Upper Body Bathing Details (indicate cue type and reason): pt using R hand to wash chest and L underarm. pt using bil Ue to apply deordorant to each arm pit. pt total (A) for R arm and under arm. Pt with deep breath after task  Lower Body Bathing: Minimal assistance, Sit to/from stand Upper Body Dressing : Maximal assistance, Sitting Lower Body Dressing: Maximal assistance, Bed level Toilet Transfer: Maximal assistance, +2 for physical assistance, +2 for safety/equipment Toilet Transfer Details (indicate cue type and reason): using stedy, simulated to recliner  Toileting- Clothing Manipulation and Hygiene: Minimal assistance Functional mobility during ADLs: Moderate assistance, Maximal assistance, +2 for physical assistance, +2 for safety/equipment General ADL Comments: pt limited by impaired cognition, weakness, balance and activity tolerance  Cognition: Cognition Overall Cognitive Status: Difficult to assess Orientation  Level: Other (comment) Cognition Arousal/Alertness: Awake/alert Behavior During Therapy: Flat affect Overall Cognitive Status: Difficult to assess Area of Impairment: Problem solving, Safety/judgement, Following commands, Attention, Awareness Current Attention Level: Sustained Following Commands: Follows one step commands with increased time, Follows one step commands inconsistently Safety/Judgement: Decreased awareness of deficits, Decreased awareness of safety Awareness: Intellectual Problem Solving: Slow processing, Requires verbal cues, Requires tactile cues, Decreased initiation, Difficulty sequencing General Comments: pt is nonverbal during session, commuincating with hand gestures and head nods; pt following approx 75% of commands with increased time (fading when fatigued), requires multimodal cueing; mother reports pt talking some words to her, baseline ADHD and not very social Difficult to assess due to: Impaired communication   Blood pressure 107/75, pulse (!) 52, temperature 97.8 F (36.6 C), temperature source Axillary, resp. rate 18, height 5\' 11"  (1.803 m), weight 50.9 kg, SpO2 97 %. Physical Exam Gen: no distress, ill appearing, lying in bed, tries to take NGT out.  HEENT: oral mucosa pink and moist, NCAT Cardio: Reg rate Chest: normal effort, normal rate of breathing Abd: soft, non-distended Ext: no edema Skin: intact Neuro: Unable to answer orientation questions. Unable to focus gaze on me. Can follow simple commands such as raising his legs and arms but unable to meaningfully participate in musculoskeletal and neurological examination.  Psych: Flat affect.   Results for orders placed or performed during the hospital encounter of 07/28/19 (from the past 24 hour(s))  Glucose, capillary     Status: Abnormal   Collection Time: 08/22/19 11:13 AM  Result Value Ref Range   Glucose-Capillary 112 (H) 70 - 99 mg/dL  Glucose, capillary     Status: Abnormal   Collection Time:  08/22/19  4:31 PM  Result Value Ref Range   Glucose-Capillary 109 (H) 70 - 99 mg/dL  Glucose, capillary     Status: Abnormal   Collection Time: 08/22/19  7:58 PM  Result Value Ref Range   Glucose-Capillary 118 (H) 70 - 99 mg/dL  Glucose, capillary     Status: Abnormal   Collection Time: 08/22/19 11:50 PM  Result Value Ref Range   Glucose-Capillary 141 (H) 70 - 99 mg/dL   Comment 1 Notify RN    Comment 2 Document in Chart   Basic metabolic panel daily     Status: Abnormal   Collection Time: 08/23/19  2:29 AM  Result Value Ref Range   Sodium 145 135 - 145 mmol/L   Potassium 4.3 3.5 - 5.1 mmol/L   Chloride 113 (H) 98 - 111 mmol/L   CO2 23 22 - 32 mmol/L   Glucose, Bld 105 (H) 70 - 99 mg/dL   BUN 40 (H) 6 - 20 mg/dL   Creatinine, Ser 1.05 0.61 - 1.24 mg/dL   Calcium 9.3 8.9 - 10.3 mg/dL   GFR calc non Af Amer >60 >60 mL/min   GFR calc Af Amer >60 >60 mL/min   Anion gap 9 5 - 15  Glucose, capillary     Status: Abnormal   Collection Time: 08/23/19  3:46 AM  Result Value Ref Range   Glucose-Capillary 108 (H) 70 - 99 mg/dL   Comment 1 Notify RN    Comment 2 Document in Chart    Mr Jeri Cos Wo Contrast  Result Date: 08/21/2019 CLINICAL DATA:  Dizziness, weakness and headache. History of HIV. EXAM: MRI HEAD WITHOUT AND WITH CONTRAST TECHNIQUE: Multiplanar, multiecho pulse sequences of the brain and surrounding structures were obtained without and with intravenous contrast. CONTRAST:  4.22mL GADAVIST GADOBUTROL 1 MMOL/ML IV SOLN COMPARISON:  None. FINDINGS: BRAIN: There is multifocal abnormal diffusion restriction within both cerebral hemispheres, greatest along the cortices of the medial frontal lobes. There is widespread hyperintense T2-weighted signal abnormality, greatest in the cerebellum. These findings are new compared to the prior MRI. There is abnormal contrast enhancement in the bilateral frontal and left temporal lobes. There is no extra-axial collection. No midline shift or other  mass effect. No hydrocephalus. VASCULAR: The major intracranial arterial and venous sinus flow voids are normal. Susceptibility-sensitive sequences show no chronic microhemorrhage or superficial siderosis. SKULL AND UPPER CERVICAL SPINE: Calvarial bone marrow signal is normal. There is no skull base mass. The visualized upper cervical spine and soft tissues are normal. SINUSES/ORBITS: There are no fluid levels or advanced mucosal thickening. The mastoid air cells and middle ear cavities are free of fluid. The orbits are normal. IMPRESSION: 1. Multifocal signal abnormality and abnormal contrast enhancement within both cerebral and cerebellar hemispheres. In this clinical scenario, the findings are most consistent with infectious cerebritis, and the pattern is compatible with cerebral toxoplasmosis. 2. No discrete abscess, though the largest area of contrast enhancement in the left frontal lobe may be in the early stages of abscess formation. 3. No hemorrhage or mass effect. Electronically Signed   By: Ulyses Jarred M.D.   On: 08/21/2019 22:33   Assessment/Plan: Diagnosis: Impaired mobility and ADLs secondary to toxoplasmosis 1. Does the need for close, 24 hr/day medical supervision in concert with the patient's rehab needs make it unreasonable for this patient to be served in a less intensive setting? No.  2. Co-Morbidities requiring supervision/potential complications: HIV, substance abuse, depression, GERD 3. Due to safety, skin/wound  care, disease management, medication administration and patient education, does the patient require 24 hr/day rehab nursing? Yes 4. Does the patient require coordinated care of a physician, rehab nurse, therapy disciplines of OT, PT, SLP to address physical and functional deficits in the context of the above medical diagnosis(es)? Yes Addressing deficits in the following areas: balance, endurance, locomotion, strength, transferring, bowel/bladder control, bathing, dressing,  feeding, grooming, toileting, cognition, speech, language, swallowing and psychosocial support 5. Can the patient actively participate in an intensive therapy program of at least 3 hrs of therapy per day at least 5 days per week? No 6. The potential for patient to make measurable gains while on inpatient rehab is poor 7. Anticipated functional outcomes upon discharge from inpatient rehab are mod assist  with PT, mod assist with OT, mod assist with SLP. 8. Estimated rehab length of stay to reach the above functional goals is: 10-14 days 9. Anticipated discharge destination: Home 10. Overall Rehab/Functional Prognosis: poor  RECOMMENDATIONS: This patient's condition is appropriate for continued rehabilitative care in the following setting: SNF Patient has agreed to participate in recommended program. N/A Note that insurance prior authorization may be required for reimbursement for recommended care.  Comment: Arthur [name] is currently MaxA to total assist for transfers and remains very weak. He has been unable to attempt standing thus far. He would be more appropriately served by SNF where he can receive a longer duration of care.   I have personally performed a face to face diagnostic evaluation, including, but not limited to relevant history and physical exam findings, of this patient and developed relevant assessment and plan.  Additionally, I have reviewed and concur with the physician assistant's documentation above.  Sula Soda, MD   Jacquelynn Cree, PA-C 08/23/2019

## 2019-08-24 DIAGNOSIS — Z7189 Other specified counseling: Secondary | ICD-10-CM

## 2019-08-24 DIAGNOSIS — Z515 Encounter for palliative care: Secondary | ICD-10-CM

## 2019-08-24 DIAGNOSIS — T17908A Unspecified foreign body in respiratory tract, part unspecified causing other injury, initial encounter: Secondary | ICD-10-CM

## 2019-08-24 DIAGNOSIS — T17908S Unspecified foreign body in respiratory tract, part unspecified causing other injury, sequela: Secondary | ICD-10-CM

## 2019-08-24 LAB — GLUCOSE, CAPILLARY
Glucose-Capillary: 111 mg/dL — ABNORMAL HIGH (ref 70–99)
Glucose-Capillary: 112 mg/dL — ABNORMAL HIGH (ref 70–99)
Glucose-Capillary: 113 mg/dL — ABNORMAL HIGH (ref 70–99)
Glucose-Capillary: 123 mg/dL — ABNORMAL HIGH (ref 70–99)
Glucose-Capillary: 127 mg/dL — ABNORMAL HIGH (ref 70–99)
Glucose-Capillary: 91 mg/dL (ref 70–99)
Glucose-Capillary: 94 mg/dL (ref 70–99)

## 2019-08-24 LAB — BASIC METABOLIC PANEL
Anion gap: 5 (ref 5–15)
BUN: 31 mg/dL — ABNORMAL HIGH (ref 6–20)
CO2: 24 mmol/L (ref 22–32)
Calcium: 9 mg/dL (ref 8.9–10.3)
Chloride: 116 mmol/L — ABNORMAL HIGH (ref 98–111)
Creatinine, Ser: 0.95 mg/dL (ref 0.61–1.24)
GFR calc Af Amer: >60 mL/min (ref >60)
GFR calc non Af Amer: >60 mL/min (ref >60)
Glucose, Bld: 120 mg/dL — ABNORMAL HIGH (ref 70–99)
Potassium: 4.3 mmol/L (ref 3.5–5.1)
Sodium: 145 mmol/L (ref 135–145)

## 2019-08-24 NOTE — Progress Notes (Signed)
Palliative: [name], [name], is lying quietly in bed.  He will briefly make but not keep eye contact.  Arthur [name] appears acutely/chronically ill and frail,  He has marked temporal wasting. He is alert, but orientation is in question.  He is able to make his basic needs known.   He will nod yes and no, but does not speak unless requested to reply verbally.  There is no family at bedside at this time.   Arthur [name] has audible rhonchi, and seems to have very poor cough.  Speech therapy is working with he and his mother.  We talk about nutrition.  Arthur [name] shakes his head "No" to PEG tube.  I ask, "Even if it means you pass away without it?".  Arthur [name] clearly understands and nods "Yes".  I encourage him to do the best he can.    We also talk about life support.  Arthur [name] verbally states "no" to life support.  Unfourtunately his mother is not present at this time to hear Arthur [name]'s wishes.   Call to mother, Arthur [name] at [phone].  I reassure Mrs. Arthur [name] that there are no problems, no emergencies.  We talked about not meeting earlier in the day with Arthur [name].  I shared that Arthur [name] expresses that he would not want a PEG tube, he would not want life support.  Mrs. Arthur [name] asks if there is something that I know that she does not.  I shared that there is not.  I talked about respect for person and hearing what matters to Arthur [name].  I share my concern over risk for pneumonia, frailty, nutritional deficits.  Questions answered. PMT to continue to follow.  Plan: Continue to treat the treatable.  Arthur [name] clearly states that he would not want life support or a PEG tube.  PMT has broached the subject with his mother, encouraging her to have these discussions (in particular Arthur [name] discussions) with Arthur [name]. PMT to shadow for declines.  31 minutes Arthur Axe, NP Palliative Medicine Team Team Phone # [phone] Greater than 50% of this time was spent counseling and coordinating care related to the above  assessment and plan

## 2019-08-24 NOTE — Progress Notes (Signed)
PROGRESS NOTE    Arthur [name]  EZM:[id number] DOB: 22-Mar-1981 DOA: 07/28/2019 PCP: Randall Hiss, MD     Brief Narrative:  Arthur [name] is a 38 year old male with past medical history significant for HIV, substance abuse, depression and GERD who presented to the emergency department on 11/1 with chief complaint of dizziness, weakness and intermittent headache. He has not taken any of his HIV medications for about 1 year due to affordability issue. MRI brain done in ED showed widespread areas of abnormal low level restricted diffusion and postcontrast enhancement throughout both cerebral hemispheres, predominantly infratentorial, with the dominant abnormality in the left anterior frontal white matter consistent with an opportunistic infection, such as toxoplasmosis, tuberculosis or fungal cerebritis/meningitis, or parasitic infection. Patient was admitted to hospital service and ID was consulted. He then revealed that he has lost his vision in his right eye. ID started him on penicillin and ganciclovir for possible CMV retinitis versus synovitis retinitis and ophthalmology consulted.  11/3: status post lumbar puncture by neurology 11/15: transferred to ICU for tx of severe hyponatremia/SIADH for tx with hypertonic saline as his mental status was worse, and respiratory status was declining 11/16: transferred to SDU  New events last 24 hours / Subjective: No new issues.  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 RIPEtx stopped -Appreciate ID. Completed PCN -Continue pyrimethamin-leucovorin, sulfadiazine   Acute toxic metabolic encephalopathy, multifactorial including CNS infection and SIADH -Repeat CT head without new process, evolving lesions of cerebellum and left frontal lobe -MRI brain with multifocal signal abnormality and abnormal contrast enhancement, most consistent with infectious cerebritis, compatible with cerebral toxoplasmosis, no discrete abscess -Stable   Right eye blindness, optic retinitis/neurititis -Mainly concerned between syphilis and CMV-butCMV PCR negative in the CNS -Appreciate ophthalmology  -Received steroid injection 08/02/2019 -Repeat MRIbrain and orbits show improvement of brain lesions and optic neurititis, retinitis  Severehyponatremia -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 Na 145 today   AKI -Resolved  Leukopenia/immunocompromised in the setting of untreated HIV -Fluconazole for thrush  Untreated HIV -ID following, off tx for now  -Bactrim for ppx   Dysphagia Severe protein calorie malnutrition -Seen by SLP -Tube feeding managed by dietitian  -Discussed PEG tube with mom; she is hesitant to agree to a procedure as she sees that he is improving.  She wants to keep monitoring and discussed with SLP team.  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 Seen by palliative care. Currently recommending continuing aggressive measures   DVT prophylaxis: Lovenox Code Status: Full Family Communication: None at bedside Disposition Plan: Patient's mom refusing SNF placement due to  visitor restrictions during Covid pandemic.  CIR evaluation to follow.  Continue PEG tube placement discussion with mom, currently  mom is hesitant and patient has refused.  Mom wants to continue evaluating patient with SLP team and continue swallow evaluations to see if he makes any improvement.   Antimicrobials:  Anti-infectives (From admission, onward)   Start     Dose/Rate Route Frequency Ordered Stop   08/23/19 0900  sulfamethoxazole-trimethoprim (BACTRIM) 200-40 MG/5ML suspension 20 mL  Status:  Discontinued     20 mL Oral 3 times weekly 08/21/19 0950 08/21/19 1311   08/23/19 0900  sulfamethoxazole-trimethoprim (BACTRIM) 200-40 MG/5ML suspension 20 mL     20 mL Per Tube 3 times weekly 08/21/19 1311     08/21/19 2000  Pyrimethamine-Leucovorin 50-25 MG CAPS 50 mg     50 mg Per Tube Daily 08/21/19 1312     08/21/19 1100  fluconazole (DIFLUCAN) IVPB 400 mg     400 mg 100 mL/hr over 120 Minutes Intravenous Every 24 hours 08/21/19 1009     08/20/19 1200  sulfaDIAZINE tablet 1,000 mg     1,000 mg Per Tube Every 6 hours 08/20/19 1118     08/19/19 1015  sulfamethoxazole-trimethoprim (BACTRIM DS) 800-160 MG per tablet 1 tablet  Status:  Discontinued     1 tablet Oral 3 times weekly 08/19/19 1002 08/21/19 0950   08/16/19 2000  Pyrimethamine-Leucovorin 50-25 MG CAPS 50 mg  Status:  Discontinued     50 mg Oral Daily 08/15/19 1337 08/15/19 1357   08/16/19 2000  Pyrimethamine-Leucovorin 50-25 MG CAPS 50 mg  Status:  Discontinued     50 mg Oral Daily 08/15/19 1357 08/21/19 1312   08/15/19 2000  Pyrimethamine-Leucovorin 50-25 MG CAPS 200 mg  Status:  Discontinued     200 mg Oral  Once 08/15/19 1337 08/15/19 1357   08/15/19 2000  Pyrimethamine-Leucovorin 50-25 MG CAPS 200 mg     200 mg Oral  Once 08/15/19 1357 08/15/19 2114   08/15/19 1800  sulfaDIAZINE tablet 1,000 mg  Status:  Discontinued     1,000 mg Oral Every 6 hours 08/15/19 1337 08/20/19 1118   08/14/19 1000  demeclocycline (DECLOMYCIN)  tablet 150 mg  Status:  Discontinued     150 mg Oral Every 12 hours 08/14/19 0848 08/14/19 0907   08/14/19 1000  demeclocycline (DECLOMYCIN) tablet 150 mg  Status:  Discontinued     150 mg Per Tube Every 12 hours 08/14/19 0907 08/19/19 1030   08/14/19 1000  fluconazole (DIFLUCAN) tablet 200 mg     200 mg Per Tube Daily 08/14/19 0907 08/18/19 1055   08/13/19 1015  fluconazole (DIFLUCAN) tablet 200 mg  Status:  Discontinued     200 mg Oral Daily 08/13/19 1009 08/14/19 0907   08/13/19 1000  penicillin g benzathine (BICILLIN LA) 1200000 UNIT/2ML injection 2.4 Million Units     2.4 Million Units Intramuscular  Once 08/08/19 1019 08/13/19 1222   08/12/19 1400  sulfamethoxazole-trimethoprim (BACTRIM) 272.48 mg in dextrose 5 % 500 mL IVPB  Status:  Discontinued     10 mg/kg/day  54.5 kg 344.7 mL/hr over 90 Minutes Intravenous Every 12 hours 08/12/19 0854 08/15/19 1337   08/11/19 1400  sulfamethoxazole-trimethoprim (BACTRIM) 400-80 MG per tablet 3 tablet  Status:  Discontinued     3 tablet Oral Every 8 hours 08/11/19 1349 08/12/19 0854   08/10/19 1000  abacavir-dolutegravir-lamiVUDine (TRIUMEQ) 600-50-300 MG per tablet 1 tablet  Status:  Discontinued     1 tablet Oral Daily 08/09/19 1514 08/12/19 0848   08/10/19 1000  sulfamethoxazole-trimethoprim (BACTRIM) 160 mg in dextrose 5 % 250  mL IVPB  Status:  Discontinued     160 mg 260 mL/hr over 60 Minutes Intravenous Daily 08/09/19 1514 08/11/19 1324   08/09/19 1730  metroNIDAZOLE (FLAGYL) IVPB 500 mg     500 mg 100 mL/hr over 60 Minutes Intravenous Every 8 hours 08/09/19 1636 08/13/19 1848   08/08/19 1000  rifampin (RIFADIN) 60 mg/mL oral suspension 600 mg  Status:  Discontinued     600 mg Per Tube Daily 08/07/19 1106 08/07/19 1319   08/08/19 1000  bictegravir-emtricitabine-tenofovir AF (BIKTARVY) 50-200-25 MG per tablet 1 tablet  Status:  Discontinued     1 tablet Oral Daily 08/07/19 1419 08/09/19 1514   08/07/19 1330   bictegravir-emtricitabine-tenofovir AF (BIKTARVY) 50-200-25 MG per tablet 1 tablet  Status:  Discontinued     1 tablet Oral Daily 08/07/19 1319 08/07/19 1419   08/07/19 1000  ethambutol (MYAMBUTOL) tablet 1,200 mg  Status:  Discontinued     1,200 mg Per Tube Daily 08/06/19 1423 08/07/19 1319   08/07/19 1000  isoniazid (NYDRAZID) tablet 300 mg  Status:  Discontinued     300 mg Per Tube Daily 08/06/19 1423 08/07/19 1319   08/07/19 1000  rifampin (RIFADIN) 60 mg/mL oral suspension 600 mg  Status:  Discontinued     600 mg Oral Daily 08/06/19 1423 08/07/19 1106   08/07/19 1000  sulfamethoxazole-trimethoprim (BACTRIM DS) 800-160 MG per tablet 1 tablet  Status:  Discontinued     1 tablet Per Tube Daily 08/06/19 1423 08/09/19 1514   08/07/19 1000  pyrazinamide tablet 1,500 mg  Status:  Discontinued     1,500 mg Per Tube Daily 08/06/19 1423 08/07/19 1319   08/03/19 1600  fluconazole (DIFLUCAN) IVPB 200 mg  Status:  Discontinued     200 mg 100 mL/hr over 60 Minutes Intravenous Every 24 hours 08/03/19 1548 08/13/19 1009   07/30/19 1700  rifampin (RIFADIN) capsule 600 mg  Status:  Discontinued     600 mg Oral Daily 07/30/19 1604 08/06/19 1423   07/30/19 1700  isoniazid (NYDRAZID) tablet 300 mg  Status:  Discontinued     300 mg Oral Daily 07/30/19 1604 08/06/19 1423   07/30/19 1700  pyrazinamide tablet 1,500 mg  Status:  Discontinued     1,500 mg Oral Daily 07/30/19 1604 08/06/19 1423   07/30/19 1700  ethambutol (MYAMBUTOL) tablet 1,200 mg  Status:  Discontinued     1,200 mg Oral Daily 07/30/19 1604 08/06/19 1423   07/29/19 1700  sulfamethoxazole-trimethoprim (BACTRIM DS) 800-160 MG per tablet 1 tablet  Status:  Discontinued     1 tablet Oral Daily 07/29/19 1601 08/06/19 1423   07/29/19 1700  ganciclovir (CYTOVENE) 285 mg in sodium chloride 0.9 % 100 mL IVPB  Status:  Discontinued     5 mg/kg  57 kg 100 mL/hr over 60 Minutes Intravenous Every 12 hours 07/29/19 1603 08/07/19 1526   07/29/19 1630   penicillin G potassium 12 Million Units in dextrose 5 % 250 mL IVPB  Status:  Discontinued     12 Million Units 250 mL/hr over 60 Minutes Intravenous Every 12 hours 07/29/19 1552 07/29/19 1559   07/29/19 1630  penicillin G potassium 12 Million Units in dextrose 5 % 500 mL continuous infusion     12 Million Units 41.7 mL/hr over 12 Hours Intravenous Every 12 hours 07/29/19 1559 08/12/19 2359       Objective: Vitals:   08/24/19 0115 08/24/19 0130 08/24/19 0330 08/24/19 0800  BP:   90/70 99/73  Pulse:  62 84  Resp: (!) 23  Temp:   97.7 F (36.5 C) 97.7 F (36.5 C)  TempSrc:   Axillary Oral  SpO2:    100%  Weight:   51.6 kg   Height:        Intake/Output Summary (Last 24 hours) at 08/24/2019 1048 Last data filed at 08/24/2019 0600 Gross per 24 hour  Intake 4160 ml  Output 500 ml  Net 3660 ml   Filed Weights   08/21/19 0351 08/23/19 0601 08/24/19 0330  Weight: 47.1 kg 50.9 kg 51.6 kg    Examination: General exam: Appears calm and comfortable  Respiratory system: Clear to auscultation. Respiratory effort normal. Cardiovascular system: S1 & S2 heard, RRR. No pedal edema. Gastrointestinal system: Abdomen is nondistended, soft and nontender. Normal bowel sounds heard. Central nervous system: Alert Extremities: Symmetric in appearance bilaterally  Skin: No rashes, lesions or ulcers on exposed skin     Data Reviewed: I have personally reviewed following labs and imaging studies  CBC: Recent Labs  Lab 08/18/19 0217 08/20/19 0249  WBC 3.2* 3.1*  NEUTROABS  --  1.9  HGB 14.7 14.0  HCT 43.3 42.3  MCV 88.2 90.8  PLT 350 288   Basic Metabolic Panel: Recent Labs  Lab 08/20/19 0249 08/20/19 0949 08/21/19 1055 08/22/19 0255 08/23/19 0229 08/24/19 0222  NA 141 143 144 146* 145 145  K 4.4  --  3.7 4.8 4.3 4.3  CL 104  --  110 110 113* 116*  CO2 28  --  GLUCOSE 153*  --  149* 134* 105* 120*  BUN 58*  --  55* 46* 40* 31*  CREATININE 1.50*   --  1.40* 1.11 1.05 0.95  CALCIUM 9.4  --  9.2 9.2 9.3 9.0  MG 2.8*  --   --   --   --   --    GFR: Estimated Creatinine Clearance: 78.5 mL/min (by C-G formula based on SCr of 0.95 mg/dL). Liver Function Tests: Recent Labs  Lab 08/20/19 0249  AST 39  ALT 26  ALKPHOS 51  BILITOT 0.6  PROT 8.7*  ALBUMIN 3.2*   No results for input(s): LIPASE, AMYLASE in the last 168 hours. No results for input(s): AMMONIA in the last 168 hours. Coagulation Profile: No results for input(s): INR, PROTIME in the last 168 hours. Cardiac Enzymes: No results for input(s): CKTOTAL, CKMB, CKMBINDEX, TROPONINI in the last 168 hours. BNP (last 3 results) No results for input(s): PROBNP in the last 8760 hours. HbA1C: No results for input(s): HGBA1C in the last 72 hours. CBG: Recent Labs  Lab 08/23/19 1611 08/23/19 2029 08/23/19 2359 08/24/19 0408 08/24/19 0812  GLUCAP 106* 114* 127* 123* 91   Lipid Profile: No results for input(s): CHOL, HDL, LDLCALC, TRIG, CHOLHDL, LDLDIRECT in the last 72 hours. Thyroid Function Tests: No results for input(s): TSH, T4TOTAL, FREET4, T3FREE, THYROIDAB in the last 72 hours. Anemia Panel: No results for input(s): VITAMINB12, FOLATE, FERRITIN, TIBC, IRON, RETICCTPCT in the last 72 hours. Sepsis Labs: No results for input(s): PROCALCITON, LATICACIDVEN in the last 168 hours.  No results found for this or any previous visit (from the past 240 hour(s)).    Radiology Studies: Dg Swallowing Func-speech Pathology  Result Date: 08/23/2019 Objective Swallowing Evaluation: Type of Study: MBS-Modified Barium Swallow Study  Patient Details Name: SHAHAB POLHAMUS MRN: [id number] Date of Birth: 10/21/1980 Today's Date: 08/23/2019 Time: SLP Start Time (ACUTE ONLY): 1335 -SLP Stop Time (  ACUTE ONLY): 1400 SLP Time Calculation (min) (ACUTE ONLY): 25 min Past Medical History: Past Medical History: Diagnosis Date . Anemia  . Anxiety  . Depression  . GERD (gastroesophageal reflux  disease)  . HIV infection ([place])  . Substance abuse [facility])  Past Surgical History: Past Surgical History: Procedure Laterality Date . TOOTH EXTRACTION   HPI: Pt is a 38 y.o. male admitted with neurosyphillis, CNS toxoplasmosis. MRI 11/1 showed widespread areas concerning for infection, with most dominant abnormality in the L anterior frontal white matter. Hospital course was complicated by hyponatremia and respiratory decline, transferred to ICU on 11/14 but not requiring intubation. Multiple CXRs this admission without acute findings. PMH: substance abuse, HIV, GERD, depression, anxiety, anemia  Subjective: pt upset after swallow study Assessment / Plan / Recommendation CHL IP CLINICAL IMPRESSIONS 08/23/2019 Clinical Impression Pt presents with a severe oral dysphagia with resultant aspiration of thin liquids. Pt has incomplete labial seal, resulting in anterior bolus loss even when his head is in an upright position. SLP provided cues for increased lip seal and tried to administer boluses as far back in his oral cavity as possible in light of reduced mandibular opening. Although pt does have lingual movement, he does not have any posterior propulsion. In fact, at times when he was trying to transit the bolus posteriorly, his lingual movement would push the bolus more anteriorly to his lips and anterior sulcus. SLP provided larger boluses and various delivery methods to assist. Attempted reclining the chair slightly to allow for gravity to assist him with oral transit. Although this did help a small amount of thin liquid get to his pharynx, the liquid spilled passively and directly into his airway without eliciting a swallow. Pt did have a weak and congested cough that occurred spontaneously, suggestive of sensation of aspiration. Aside from the small amount that was aspirated, the remaining boluses, regardless of consistency, were either spilled passively out of his mouth or removed via suction. SLP reviewed results  with patient and per his request, also called to share results with his mother. She would like to be present for future sessions whenever possible. I shared that we will try to coordinate this for this upcoming week. She is motivated to help him so I encouraged her to provide cues focusing on secretion management at this time, including trying to swallow some of his saliva, closing his lips to keep it in his mouth, and trying to cough and use the yankuaer himself.  Will continue to follow and progress as able, but at the moment recommend that pt remain NPO with frequent oral care.  SLP Visit Diagnosis Dysphagia, oropharyngeal phase (R13.12) Attention and concentration deficit following -- Frontal lobe and executive function deficit following -- Impact on safety and function Severe aspiration risk;Risk for inadequate nutrition/hydration   CHL IP TREATMENT RECOMMENDATION 08/23/2019 Treatment Recommendations Therapy as outlined in treatment plan below   Prognosis 08/23/2019 Prognosis for Safe Diet Advancement Good Barriers to Reach Goals Severity of deficits Barriers/Prognosis Comment -- CHL IP DIET RECOMMENDATION 08/23/2019 SLP Diet Recommendations NPO Liquid Administration via -- Medication Administration Via alternative means Compensations -- Postural Changes --   CHL IP OTHER RECOMMENDATIONS 08/23/2019 Recommended Consults -- Oral Care Recommendations Oral care QID Other Recommendations Have oral suction available   CHL IP FOLLOW UP RECOMMENDATIONS 08/23/2019 Follow up Recommendations Inpatient Rehab   CHL IP FREQUENCY AND DURATION 08/23/2019 Speech Therapy Frequency (ACUTE ONLY) min 2x/week Treatment Duration 2 weeks      CHL IP ORAL  PHASE 08/23/2019 Oral Phase Impaired Oral - Pudding Teaspoon -- Oral - Pudding Cup -- Oral - Honey Teaspoon -- Oral - Honey Cup -- Oral - Nectar Teaspoon Weak lingual manipulation;Reduced posterior propulsion;Decreased bolus cohesion;Other (Comment) Oral - Nectar Cup -- Oral - Nectar  Straw -- Oral - Thin Teaspoon Weak lingual manipulation;Reduced posterior propulsion;Decreased bolus cohesion;Other (Comment) Oral - Thin Cup -- Oral - Thin Straw Weak lingual manipulation;Reduced posterior propulsion;Decreased bolus cohesion;Other (Comment) Oral - Puree Weak lingual manipulation;Reduced posterior propulsion;Decreased bolus cohesion;Other (Comment) Oral - Mech Soft -- Oral - Regular -- Oral - Multi-Consistency -- Oral - Pill -- Oral Phase - Comment --  CHL IP PHARYNGEAL PHASE 08/23/2019 Pharyngeal Phase Impaired Pharyngeal- Pudding Teaspoon -- Pharyngeal -- Pharyngeal- Pudding Cup -- Pharyngeal -- Pharyngeal- Honey Teaspoon -- Pharyngeal -- Pharyngeal- Honey Cup -- Pharyngeal -- Pharyngeal- Nectar Teaspoon -- Pharyngeal -- Pharyngeal- Nectar Cup -- Pharyngeal -- Pharyngeal- Nectar Straw -- Pharyngeal -- Pharyngeal- Thin Teaspoon Penetration/Aspiration before swallow Pharyngeal -- Pharyngeal- Thin Cup -- Pharyngeal -- Pharyngeal- Thin Straw -- Pharyngeal -- Pharyngeal- Puree -- Pharyngeal -- Pharyngeal- Mechanical Soft -- Pharyngeal -- Pharyngeal- Regular -- Pharyngeal -- Pharyngeal- Multi-consistency -- Pharyngeal -- Pharyngeal- Pill -- Pharyngeal -- Pharyngeal Comment --  No flowsheet data found. Virl Axe Nix 08/23/2019, 3:23 PM  Ivar Drape, M.A. CCC-SLP Acute Rehabilitation Services Pager [phone] Office [phone]                Scheduled Meds: . enoxaparin (LOVENOX) injection  40 mg Subcutaneous Q24H  . famotidine  20 mg Per Tube Daily  . feeding supplement (PRO-STAT SUGAR FREE 64)  30 mL Per Tube Daily  . free water  100 mL Per Tube Q8H  . insulin aspart  0-9 Units Subcutaneous Q4H  . magic mouthwash w/lidocaine  10 mL Oral TID AC & HS  . mouth rinse  15 mL Mouth Rinse BID  . mirtazapine  15 mg Per Tube QHS  . multivitamin with minerals  1 tablet Per Tube Daily  . Pyrimethamine-Leucovorin  50 mg Per Tube Daily  . sulfaDIAZINE  1,000 mg Per Tube Q6H  .  sulfamethoxazole-trimethoprim  20 mL Per Tube 3 times weekly  . terbinafine   Topical Daily   Continuous Infusions: . chlorproMAZINE (THORAZINE) IV 12.5 mg (08/09/19 1135)  . feeding supplement (OSMOLITE 1.5 CAL) 60 mL/hr at 08/24/19 0600  . fluconazole (DIFLUCAN) IV 400 mg (08/23/19 1136)     LOS: 26 days      Time spent: 25 minutes   Noralee Stain, DO [facility] 08/24/2019, 10:48 AM   Available via Epic secure chat 7am-7pm After these hours, please refer to coverage provider listed on [email or web address]

## 2019-08-25 LAB — BASIC METABOLIC PANEL
Anion gap: 6 (ref 5–15)
BUN: 26 mg/dL — ABNORMAL HIGH (ref 6–20)
CO2: 21 mmol/L — ABNORMAL LOW (ref 22–32)
Calcium: 8.9 mg/dL (ref 8.9–10.3)
Chloride: 116 mmol/L — ABNORMAL HIGH (ref 98–111)
Creatinine, Ser: 0.84 mg/dL (ref 0.61–1.24)
GFR calc Af Amer: >60 mL/min (ref >60)
GFR calc non Af Amer: >60 mL/min (ref >60)
Glucose, Bld: 113 mg/dL — ABNORMAL HIGH (ref 70–99)
Potassium: 4.7 mmol/L (ref 3.5–5.1)
Sodium: 143 mmol/L (ref 135–145)

## 2019-08-25 LAB — GLUCOSE, CAPILLARY
Glucose-Capillary: 106 mg/dL — ABNORMAL HIGH (ref 70–99)
Glucose-Capillary: 112 mg/dL — ABNORMAL HIGH (ref 70–99)
Glucose-Capillary: 114 mg/dL — ABNORMAL HIGH (ref 70–99)
Glucose-Capillary: 120 mg/dL — ABNORMAL HIGH (ref 70–99)
Glucose-Capillary: 94 mg/dL (ref 70–99)

## 2019-08-25 LAB — CBC
HCT: 35.8 % — ABNORMAL LOW (ref 39.0–52.0)
Hemoglobin: 11.3 g/dL — ABNORMAL LOW (ref 13.0–17.0)
MCH: 30.3 pg (ref 26.0–34.0)
MCHC: 31.6 g/dL (ref 30.0–36.0)
MCV: 96 fL (ref 80.0–100.0)
Platelets: 235 10*3/uL (ref 150–400)
RBC: 3.73 MIL/uL — ABNORMAL LOW (ref 4.22–5.81)
RDW: 14.6 % (ref 11.5–15.5)
WBC: 2 10*3/uL — ABNORMAL LOW (ref 4.0–10.5)
nRBC: 0 % (ref 0.0–0.2)

## 2019-08-25 NOTE — Progress Notes (Signed)
Physical Therapy Treatment Patient Details Name: Arthur [name] MRN: [id number] DOB: 15-Sep-1981 Today's Date: 08/25/2019    History of Present Illness Pt is a 38 y.o. male admitted 07/28/19 with c/o worsening dizziness, headaches; has not taken HIV medications for ~1 yr due to afforability issues. Brain MRI consistent with opportunistic infection throughout both cerebral hemispheres. Pt also with lost R eye vision. Lumbar puncture 11/3. 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 Transfer to ICU 11/15 with worsening mental and respiratory status. PMH includes untreated HIB, depression, substance abuse.    PT Comments    Patient remains very weak, including head and trunk musculature with requiring max to total assist to maintain sitting at EOB. Attempted placing UEs in external rotation, extension for prop-sitting, however pt's shoulders and elbow could not achieve position due to tightness. Practiced lateral propping on elbow and pt requires assist to support his trunk even in these positions. He is able to activate muscles as requested to try to move his head/torso, however is too weak to do so on his own. Propped forward with elbows on knees/thighs and worked on neck extension with min movement/strength noted. Was not strong enough to attempt standing with RW as was planned and will need to resume use of lift equipment next visit.   Follow Up Recommendations  CIR;Supervision/Assistance - 24 hour(family declining SNF)     Equipment Recommendations  (TBD)    Recommendations for Other Services       Precautions / Restrictions Precautions Precautions: Fall;Other (comment) Precaution Comments: watch BP, HR Restrictions Weight Bearing Restrictions: No    Mobility  Bed Mobility Overal bed mobility: Needs Assistance Bed Mobility: Rolling;Sidelying to Sit;Sit to Supine Rolling: Mod assist Sidelying to sit: Max assist   Sit to supine: +2 for  physical assistance;Max assist;+2 for safety/equipment   General bed mobility comments: assist for mgmt of LEs/trunk; increased time required to initate and process tasks   Transfers                 General transfer comment: had planned to attempt stand with RW, however pt's balance and trunk strength too impaired; will need to continue with steady  Ambulation/Gait                 Stairs             Wheelchair Mobility    Modified Rankin (Stroke Patients Only)       Balance Overall balance assessment: Needs assistance Sitting-balance support: Feet supported;Bilateral upper extremity supported Sitting balance-Leahy Scale: Zero Sitting balance - Comments: EOB x 10 minutes with varying positions of UEs but unable to support himself Postural control: Right lateral lean                                  Cognition Arousal/Alertness: Awake/alert Behavior During Therapy: Flat affect Overall Cognitive Status: Difficult to assess                     Current Attention Level: Sustained   Following Commands: Follows one step commands with increased time   Awareness: Intellectual Problem Solving: Slow processing;Requires verbal cues;Requires tactile cues;Decreased initiation;Difficulty sequencing General Comments: pt is nonverbal during session, commuincating with hand gestures and head nods      Exercises      General Comments General comments (skin integrity, edema, etc.): VSS      Pertinent Vitals/Pain  Pain Assessment: Faces Faces Pain Scale: No hurt    Home Living                      Prior Function            PT Goals (current goals can now be found in the care plan section) Acute Rehab PT Goals Patient Stated Goal: If not CIR, home with family assist Time For Goal Achievement: 09/04/19 Potential to Achieve Goals: Fair Progress towards PT goals: Progressing toward goals(slowly)    Frequency    Min  3X/week      PT Plan Current plan remains appropriate    Co-evaluation              AM-PAC PT "6 Clicks" Mobility   Outcome Measure  Help needed turning from your back to your side while in a flat bed without using bedrails?: A Lot Help needed moving from lying on your back to sitting on the side of a flat bed without using bedrails?: A Lot Help needed moving to and from a bed to a chair (including a wheelchair)?: Total Help needed standing up from a chair using your arms (e.g., wheelchair or bedside chair)?: Total Help needed to walk in hospital room?: Total Help needed climbing 3-5 steps with a railing? : Total 6 Click Score: 8    End of Session   Activity Tolerance: Patient limited by fatigue Patient left: with call bell/phone within reach;in bed;with bed alarm set(chair position, reclined enough for head control/support) Nurse Communication: Mobility status;Need for lift equipment PT Visit Diagnosis: Other abnormalities of gait and mobility (R26.89);Muscle weakness (generalized) (M62.81)     Time: 1610-9604 PT Time Calculation (min) (ACUTE ONLY): 30 min  Charges:  $Neuromuscular Re-education: 23-37 mins                      Barry Brunner, PT Pager [phone]    Rexanne Mano 08/25/2019, 11:56 AM

## 2019-08-25 NOTE — Progress Notes (Signed)
[name]  PROGRESS NOTE    Arthur [name]  TKP:[id number] DOB: 06/09/1981 DOA: 07/28/2019 PCP: Randall Hiss, MD   Brief Narrative:   Arthur [name] is a 38 year old male with past medical history significant for HIV, substance abuse, depression and GERD who presented to the emergency department on 11/1 with chief complaint of dizziness, weakness and intermittent headache. He has not taken any of his HIV medications for about 1 year due to affordability issue. MRI brain done in ED showed widespread areas of abnormal low level restricted diffusion and postcontrast enhancement throughout both cerebral hemispheres, predominantly infratentorial, with the dominant abnormality in the left anterior frontal white matter consistent with an opportunistic infection, such as toxoplasmosis, tuberculosis or fungal cerebritis/meningitis, or parasitic infection. Patient was admitted to hospital service and ID was consulted. He then revealed that he has lost his vision in his right eye. ID started him on penicillin and ganciclovir for possible CMV retinitis versus synovitis retinitis and ophthalmology consulted.  11/29: No acute events ON per nursing.    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 RIPEtx  stopped     - Appreciate ID. Completed PCN     - Continue pyrimethamin-leucovorin, sulfadiazine     - remains afebrile; VSS; continue as above   Acute toxic metabolic encephalopathy, multifactorial including CNS infection and SIADH     - Repeat CT head without new process, evolving lesions of cerebellum and left frontal lobe     - MRI brain with multifocal signal abnormality and abnormal contrast enhancement, most consistent with infectious cerebritis, compatible with cerebral toxoplasmosis, no discrete abscess     - no changes; continue as above   Right eye blindness, optic retinitis/neurititis     - Mainly concerned between syphilis and CMV-butCMV PCR negative in the CNS     - Appreciate ophthalmology      - Received steroid injection 08/02/2019     - Repeat MRIbrain and orbits show improvement of brain lesions and optic neurititis, retinitis     - no chagnes; continue as above  Severehyponatremia     - Likely 2/2severeSIADH     - Transferred to ICU for 3% saline and IV lasix     - Nephrology consulted; signed off 11/20      - resolved; monitor Na+  AKI     - Resolved; monitor SCr  Untreated HIV/Advanced HIV/AIDS Leukopenia/immunocompromised in the setting of untreated HIV Thrush     - Fluconazole for thrush; magic mouthwash    - ID following, off tx for now      - Bactrim for ppx   Dysphagia Severe protein calorie malnutrition FTT     - Seen by SLP     - TF managed by dietitian      -  Discussed PEG tube with mom; she is hesitant to agree to a procedure as she sees that he is improving.  She wants to keep monitoring and discussed with SLP team. Patient underwent MBS 11/27     - pt d/w PC; does not want PEG placed; see PC note; to continue d/w family  DVT prophylaxis: lovenox Code Status: FULL   Disposition Plan: TBD  Consultants:   Palliative Care  Infectious Disease  Antimicrobials:  . pyrimethamin-leucovorin, sulfadiazine   Subjective: No acute  events ON per nursing.  Objective: Vitals:   08/24/19 2300 08/25/19 0240 08/25/19 0800 08/25/19 1200  BP: 95/74 102/74 96/71 100/71  Pulse: 68 71 63 79  Resp: 14 (!) 21 18 (!) 27  Temp: (!) 97.5 F (36.4 C) 97.8 F (36.6 C) 97.6 F (36.4 C) 97.8 F (36.6 C)  TempSrc: Axillary Oral Oral Oral  SpO2: 100% 96% 100% 100%  Weight:  49.7 kg    Height:        Intake/Output Summary (Last 24 hours) at 08/25/2019 1406 Last data filed at 08/25/2019 0800 Gross per 24 hour  Intake 1358.38 ml  Output 550 ml  Net 808.38 ml   Filed Weights   08/23/19 0601 08/24/19 0330 08/25/19 0240  Weight: 50.9 kg 51.6 kg 49.7 kg    Examination:  General: 38 y.o. ill appearing male resting in bed in NAD Cardiovascular: RRR, +S1, S2, no m/g/r, equal pulses throughout Respiratory: CTABL, no w/r/r, normal WOB GI: BS+, NDNT, no masses noted, no organomegaly noted MSK: No e/c/c Neuro: somnolent  Data Reviewed: I have personally reviewed following labs and imaging studies.  CBC: Recent Labs  Lab 08/20/19 0249 08/25/19 0225  WBC 3.1* 2.0*  NEUTROABS 1.9  --   HGB 14.0 11.3*  HCT 42.3 35.8*  MCV 90.8 96.0  PLT 288 409   Basic Metabolic Panel: Recent Labs  Lab 08/20/19 0249  08/21/19 1055 08/22/19 0255 08/23/19 0229 08/24/19 0222 08/25/19 0225  NA 141   < > 144 146* 145 145 143  K 4.4  --  3.7 4.8 4.3 4.3 4.7  CL 104  --  110 110 113* 116* 116*  CO2 28  --  25 27 23 24  21*  GLUCOSE 153*  --  149* 134* 105* 120* 113*  BUN 58*  --  55* 46* 40* 31* 26*  CREATININE 1.50*  --  1.40* 1.11 1.05 0.95 0.84  CALCIUM 9.4  --  9.2 9.2 9.3 9.0 8.9  MG 2.8*  --   --   --   --   --   --    < > = values in this interval not displayed.   GFR: Estimated Creatinine Clearance: 85.5 mL/min (by C-G formula based on SCr of 0.84 mg/dL). Liver Function Tests: Recent Labs  Lab 08/20/19 0249  AST 39  ALT 26  ALKPHOS 51  BILITOT 0.6  PROT 8.7*  ALBUMIN 3.2*   No results for input(s): LIPASE,  AMYLASE in the last 168 hours. No results for input(s): AMMONIA in the last 168 hours. Coagulation Profile: No results for input(s): INR, PROTIME in the last 168 hours. Cardiac Enzymes: No results for input(s): CKTOTAL, CKMB, CKMBINDEX, TROPONINI in the last 168 hours. BNP (last 3 results) No results for input(s): PROBNP in the last 8760 hours. HbA1C: No results for input(s): HGBA1C in the last 72 hours. CBG: Recent Labs  Lab 08/24/19 1617 08/24/19 2023 08/24/19 2352 08/25/19 0514 08/25/19 1141  GLUCAP 113* 112* 94 112*  114*   Lipid Profile: No results for input(s): CHOL, HDL, LDLCALC, TRIG, CHOLHDL, LDLDIRECT in the last 72 hours. Thyroid Function Tests: No results for input(s): TSH, T4TOTAL, FREET4, T3FREE, THYROIDAB in the last 72 hours. Anemia Panel: No results for input(s): VITAMINB12, FOLATE, FERRITIN, TIBC, IRON, RETICCTPCT in the last 72 hours. Sepsis Labs: No results for input(s): PROCALCITON, LATICACIDVEN in the last 168 hours.  No results found for this or any previous visit (from the past 240 hour(s)).    Radiology Studies: Dg Swallowing Func-speech Pathology  Result Date: 08/23/2019 Objective Swallowing Evaluation: Type of Study: MBS-Modified Barium Swallow Study  Patient Details Name: Arthur [name] MRN: [id number] Date of Birth: [birth year] Today's Date: 08/23/2019 Time: SLP Start Time (ACUTE ONLY): 1335 -SLP Stop Time (ACUTE ONLY): 1400 SLP Time Calculation (min) (ACUTE ONLY): 25 min Past Medical History: Past Medical History: Diagnosis Date . Anemia  . Anxiety  . Depression  . GERD (gastroesophageal reflux disease)  . HIV infection (HCC)  . Substance abuse [facility])  Past Surgical History: Past Surgical History: Procedure Laterality Date . TOOTH EXTRACTION   HPI: Pt is a 38 y.o. male admitted with neurosyphillis, CNS toxoplasmosis. MRI 11/1 showed widespread areas concerning for infection, with most dominant abnormality in the L anterior frontal white matter. Hospital  course was complicated by hyponatremia and respiratory decline, transferred to ICU on 11/14 but not requiring intubation. Multiple CXRs this admission without acute findings. PMH: substance abuse, HIV, GERD, depression, anxiety, anemia  Subjective: pt upset after swallow study Assessment / Plan / Recommendation [facility] IP CLINICAL IMPRESSIONS 08/23/2019 Clinical Impression Pt presents with a severe oral dysphagia with resultant aspiration of thin liquids. Pt has incomplete labial seal, resulting in anterior bolus loss even when his head is in an upright position. SLP provided cues for increased lip seal and tried to administer boluses as far back in his oral cavity as possible in light of reduced mandibular opening. Although pt does have lingual movement, he does not have any posterior propulsion. In fact, at times when he was trying to transit the bolus posteriorly, his lingual movement would push the bolus more anteriorly to his lips and anterior sulcus. SLP provided larger boluses and various delivery methods to assist. Attempted reclining the chair slightly to allow for gravity to assist him with oral transit. Although this did help a small amount of thin liquid get to his pharynx, the liquid spilled passively and directly into his airway without eliciting a swallow. Pt did have a weak and congested cough that occurred spontaneously, suggestive of sensation of aspiration. Aside from the small amount that was aspirated, the remaining boluses, regardless of consistency, were either spilled passively out of his mouth or removed via suction. SLP reviewed results with patient and per his request, also called to share results with his mother. She would like to be present for future sessions whenever possible. I shared that we will try to coordinate this for this upcoming week. She is motivated to help him so I encouraged her to provide cues focusing on secretion management at this time, including trying to swallow some of  his saliva, closing his lips to keep it in his mouth, and trying to cough and use the yankuaer himself.  Will continue to follow and progress as able, but at the moment recommend that pt remain NPO with frequent oral care.  SLP Visit Diagnosis Dysphagia, oropharyngeal phase (R13.12) Attention and concentration deficit following -- Frontal lobe and executive function deficit following -- Impact  on safety and function Severe aspiration risk;Risk for inadequate nutrition/hydration   [facility] IP TREATMENT RECOMMENDATION 08/23/2019 Treatment Recommendations Therapy as outlined in treatment plan below   Prognosis 08/23/2019 Prognosis for Safe Diet Advancement Good Barriers to Reach Goals Severity of deficits Barriers/Prognosis Comment -- [facility] IP DIET RECOMMENDATION 08/23/2019 SLP Diet Recommendations NPO Liquid Administration via -- Medication Administration Via alternative means Compensations -- Postural Changes --   [facility] IP OTHER RECOMMENDATIONS 08/23/2019 Recommended Consults -- Oral Care Recommendations Oral care QID Other Recommendations Have oral suction available   [facility] IP FOLLOW UP RECOMMENDATIONS 08/23/2019 Follow up Recommendations Inpatient Rehab   [facility] IP FREQUENCY AND DURATION 08/23/2019 Speech Therapy Frequency (ACUTE ONLY) min 2x/week Treatment Duration 2 weeks      [facility] IP ORAL PHASE 08/23/2019 Oral Phase Impaired Oral - Pudding Teaspoon -- Oral - Pudding Cup -- Oral - Honey Teaspoon -- Oral - Honey Cup -- Oral - Nectar Teaspoon Weak lingual manipulation;Reduced posterior propulsion;Decreased bolus cohesion;Other (Comment) Oral - Nectar Cup -- Oral - Nectar Straw -- Oral - Thin Teaspoon Weak lingual manipulation;Reduced posterior propulsion;Decreased bolus cohesion;Other (Comment) Oral - Thin Cup -- Oral - Thin Straw Weak lingual manipulation;Reduced posterior propulsion;Decreased bolus cohesion;Other (Comment) Oral - Puree Weak lingual manipulation;Reduced posterior propulsion;Decreased bolus cohesion;Other  (Comment) Oral - Mech Soft -- Oral - Regular -- Oral - Multi-Consistency -- Oral - Pill -- Oral Phase - Comment --  [facility] IP PHARYNGEAL PHASE 08/23/2019 Pharyngeal Phase Impaired Pharyngeal- Pudding Teaspoon -- Pharyngeal -- Pharyngeal- Pudding Cup -- Pharyngeal -- Pharyngeal- Honey Teaspoon -- Pharyngeal -- Pharyngeal- Honey Cup -- Pharyngeal -- Pharyngeal- Nectar Teaspoon -- Pharyngeal -- Pharyngeal- Nectar Cup -- Pharyngeal -- Pharyngeal- Nectar Straw -- Pharyngeal -- Pharyngeal- Thin Teaspoon Penetration/Aspiration before swallow Pharyngeal -- Pharyngeal- Thin Cup -- Pharyngeal -- Pharyngeal- Thin Straw -- Pharyngeal -- Pharyngeal- Puree -- Pharyngeal -- Pharyngeal- Mechanical Soft -- Pharyngeal -- Pharyngeal- Regular -- Pharyngeal -- Pharyngeal- Multi-consistency -- Pharyngeal -- Pharyngeal- Pill -- Pharyngeal -- Pharyngeal Comment --  No flowsheet data found. Virl AxeLaura P Nix 08/23/2019, 3:23 PM  Ivar DrapeLaura Nix, M.A. CCC-SLP Acute Rehabilitation Services Pager [phone] Office [phone]               Scheduled Meds: . enoxaparin (LOVENOX) injection  40 mg Subcutaneous Q24H  . famotidine  20 mg Per Tube Daily  . feeding supplement (PRO-STAT SUGAR FREE 64)  30 mL Per Tube Daily  . free water  100 mL Per Tube Q8H  . insulin aspart  0-9 Units Subcutaneous Q4H  . magic mouthwash w/lidocaine  10 mL Oral TID AC & HS  . mouth rinse  15 mL Mouth Rinse BID  . mirtazapine  15 mg Per Tube QHS  . multivitamin with minerals  1 tablet Per Tube Daily  . Pyrimethamine-Leucovorin  50 mg Per Tube Daily  . sulfaDIAZINE  1,000 mg Per Tube Q6H  . sulfamethoxazole-trimethoprim  20 mL Per Tube 3 times weekly  . terbinafine   Topical Daily   Continuous Infusions: . chlorproMAZINE (THORAZINE) IV 12.5 mg (08/09/19 1135)  . feeding supplement (OSMOLITE 1.5 CAL) 60 mL/hr at 08/25/19 0800  . fluconazole (DIFLUCAN) IV 400 mg (08/25/19 1230)     LOS: 27 days    Time spent: 25 minutes spent in the coordination of care  today.    Teddy Spikeyrone A Khamryn Calderone, DO [facility] Pager [phone]  If 7PM-7AM, please contact night-coverage [email or web address] Password [email or web address] 08/25/2019, 2:06 PM

## 2019-08-26 LAB — GLUCOSE, CAPILLARY
Glucose-Capillary: 147 mg/dL — ABNORMAL HIGH (ref 70–99)
Glucose-Capillary: 100 mg/dL — ABNORMAL HIGH (ref 70–99)
Glucose-Capillary: 77 mg/dL (ref 70–99)
Glucose-Capillary: 85 mg/dL (ref 70–99)
Glucose-Capillary: 117 mg/dL — ABNORMAL HIGH (ref 70–99)

## 2019-08-26 MED ORDER — DOLUTEGRAVIR SODIUM 50 MG PO TABS
50.0000 mg | ORAL_TABLET | Freq: Every day | ORAL | Status: DC
  Administered 2019-08-26 – 2019-09-07 (×12): 50 mg via ORAL
  Filled 2019-08-26 (×13): qty 1

## 2019-08-26 MED ORDER — EMTRICITABINE-TENOFOVIR AF 200-25 MG PO TABS
1.0000 | ORAL_TABLET | Freq: Every day | ORAL | Status: DC
  Administered 2019-08-26 – 2019-09-07 (×12): 1 via ORAL
  Filled 2019-08-26 (×13): qty 1

## 2019-08-26 NOTE — Progress Notes (Signed)
Inpatient Rehabilitation-Admissions Coordinator   Followed up with pt after PM&R consult. (Please see consult completed on 11/27 by Dr. Ranell Patrick for details). Pt in bed during assessment, following some simple 1 step commands and using gestures to communicate most of the time. I also followed up with OT/PT after their treatment session. Noted pt was able to stand today in the [place] and had greater use of BUE support today. At this time, feel pt is not able to tolerate an intensive therapy program but do feel that given his gains in the past few days, pt may become appropriate in the near future. Will follow along for greater tolerance and continue to assess his candidacy for IP Rehab.   Jhonnie Garner, OTR/L  Rehab Admissions Coordinator  [phone] 08/26/2019 4:36 PM

## 2019-08-26 NOTE — Progress Notes (Signed)
Physical Therapy Treatment Patient Details Name: Arthur [name] MRN: [id number] DOB: 02-25-1981 Today's Date: 08/26/2019    History of Present Illness Pt is a 38 y.o. male admitted 07/28/19 with c/o worsening dizziness, headaches; has not taken HIV medications for ~1 yr due to afforability issues. Brain MRI consistent with opportunistic infection throughout both cerebral hemispheres. Pt also with lost R eye vision. Lumbar puncture 11/3. 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 Transfer to ICU 11/15 with worsening mental and respiratory status. PMH includes untreated HIV, depression, substance abuse.   PT Comments    Pt progressing with mobility, demonstrates improvements with cognition this session. Following simple commands with improved initiation and sequencing of movement with various multimodal cues. Pt performed sitting balance tasks and multiple standing trials in [place] with min-maxA+2. Improved use of BUE support. Also with improved ability to cough and use yonker to clear secretions. Continue to recommend intensive CIR-level therapies to maximize functional mobility and independence prior to return home. Pt with very supportive family willing to provide as much assist as necessary; feel pt could reach +1 assist goals with CIR.     Follow Up Recommendations  CIR;Supervision/Assistance - 24 hour(pt/family declining SNF)     Equipment Recommendations  (TBD)    Recommendations for Other Services       Precautions / Restrictions Precautions Precautions: Fall;Other (comment) Precaution Comments: NGT, soft BP Restrictions Weight Bearing Restrictions: No    Mobility  Bed Mobility Overal bed mobility: Needs Assistance Bed Mobility: Supine to Sit;Sit to Supine     Supine to sit: Max assist Sit to supine: Max assist;+2 for safety/equipment   General bed mobility comments: Pt demonstrates improved initiation when cued for bed mobility,  moving BLEs to EOB and reaching for PT's hand for UE support; maxA for trunk elevation and scooting hips to EOB. +2 assist to block knees and keep pt from sliding off EOB  Transfers Overall transfer level: Needs assistance Equipment used: Ambulation equipment used Transfers: Sit to/from Stand Sit to Stand: Mod assist;Min assist;+2 physical assistance;From elevated surface         General transfer comment: ModA+2 to stand from EOB with BUE support pulling on Stedy hand rail, assist to cue hip extension and upright posture. Performed 2x more standing trials from [facility] seat with minA, heavy reliance on UE support; full support of stedy to block bilateral knees  Ambulation/Gait                 Stairs             Wheelchair Mobility    Modified Rankin (Stroke Patients Only)       Balance Overall balance assessment: Needs assistance Sitting-balance support: Feet supported;Bilateral upper extremity supported Sitting balance-Leahy Scale: Poor Sitting balance - Comments: Pt initiating reaching and weight shifts with cued, requiring mod-maxA to maintain sitting balance, periods of close min guard to minA but unable to accept challenge Postural control: Right lateral lean;Posterior lean Standing balance support: Bilateral upper extremity supported;During functional activity Standing balance-Leahy Scale: Poor Standing balance comment: with R lateral bias in standing; able to correct towards midline with cueing and reaching                            Cognition Arousal/Alertness: Awake/alert Behavior During Therapy: Flat affect Overall Cognitive Status: Difficult to assess Area of Impairment: Problem solving;Safety/judgement;Following commands;Attention;Awareness  Current Attention Level: Sustained;Selective   Following Commands: Follows one step commands with increased time Safety/Judgement: Decreased awareness of deficits;Decreased  awareness of safety Awareness: Intellectual;Emergent Problem Solving: Slow processing;Requires verbal cues;Requires tactile cues;Decreased initiation;Difficulty sequencing General Comments: Pt with a few verbalizations this session, mainly communicating with head nods and hand gestures. Recalled use of Stedy and sequencing with this from last session. Following majority, if not all one-step commands. Improved initiation of movement. Discussed post-acute rehab before return home, pt wants to go straight home; likely poor insight into what all this would entail assist/DME-wise      Exercises      General Comments        Pertinent Vitals/Pain Pain Assessment: No/denies pain    Home Living                      Prior Function            PT Goals (current goals can now be found in the care plan section) Progress towards PT goals: Progressing toward goals    Frequency    Min 3X/week      PT Plan Current plan remains appropriate    Co-evaluation PT/OT/SLP Co-Evaluation/Treatment: Yes Reason for Co-Treatment: For patient/therapist safety;To address functional/ADL transfers PT goals addressed during session: Mobility/safety with mobility;Balance        AM-PAC PT "6 Clicks" Mobility   Outcome Measure  Help needed turning from your back to your side while in a flat bed without using bedrails?: A Lot Help needed moving from lying on your back to sitting on the side of a flat bed without using bedrails?: A Lot Help needed moving to and from a bed to a chair (including a wheelchair)?: Total Help needed standing up from a chair using your arms (e.g., wheelchair or bedside chair)?: A Lot Help needed to walk in hospital room?: Total Help needed climbing 3-5 steps with a railing? : Total 6 Click Score: 9    End of Session   Activity Tolerance: Patient tolerated treatment well Patient left: in bed;with call bell/phone within reach;with bed alarm set Nurse Communication:  Mobility status;Need for lift equipment PT Visit Diagnosis: Other abnormalities of gait and mobility (R26.89);Muscle weakness (generalized) (M62.81)     Time: 7824-2353 PT Time Calculation (min) (ACUTE ONLY): 30 min  Charges:  $Therapeutic Activity: 8-22 mins                    Mabeline Caras, PT, DPT Acute Rehabilitation Services  Pager [phone] Office [place] 08/26/2019, 2:43 PM

## 2019-08-26 NOTE — Progress Notes (Signed)
[place] for Infectious Disease    Date of Admission:  07/28/2019   Total days of antibiotics 25        Day 20 Fluconazole        Day 11 Pyrimethamine + sulfadiazine         Bactrim ppx    ID: Arthur [name] is a 38 y.o. male with  Advanced hiv disease, found to have CNS infections with syphilis (treatment completed) and toxoplasmosis both + on CSF  Principal Problem:   Toxoplasmosis Active Problems:   Neurosyphilis   Acquired immunodeficiency syndrome ([place])   Vision loss   Abnormal brain MRI   Nausea and vomiting   Bradycardia   Hyponatremia   Palliative care encounter   Thrush of mouth and esophagus ([place])   Goals of care, counseling/discussion   Protein-calorie malnutrition, severe   Weakness generalized   Dysphagia   Aspiration into airway   Palliative care by specialist   DNR (do not resuscitate) discussion    Subjective: Afebrile. Leucopenia persists. Nods appropriately to questions.  Noted that he has refused consideration of PEG tube with discussions recently but indicates that he would want this today.  PT recommending CIR vs SNF (family declined SNF) ST continues to recommend NPO following MBS 11/27.   Medications:  . enoxaparin (LOVENOX) injection  40 mg Subcutaneous Q24H  . famotidine  20 mg Per Tube Daily  . feeding supplement (PRO-STAT SUGAR FREE 64)  30 mL Per Tube Daily  . free water  100 mL Per Tube Q8H  . insulin aspart  0-9 Units Subcutaneous Q4H  . magic mouthwash w/lidocaine  10 mL Oral TID AC & HS  . mouth rinse  15 mL Mouth Rinse BID  . mirtazapine  15 mg Per Tube QHS  . multivitamin with minerals  1 tablet Per Tube Daily  . Pyrimethamine-Leucovorin  50 mg Per Tube Daily  . sulfaDIAZINE  1,000 mg Per Tube Q6H  . sulfamethoxazole-trimethoprim  20 mL Per Tube 3 times weekly  . terbinafine   Topical Daily    Objective: Vital signs in last 24 hours: Temp:  [97.6 F (36.4 C)-97.8 F (36.6 C)] 97.7 F (36.5 C) (11/30 0800) Pulse  Rate:  [60-79] 60 (11/30 0800) Resp:  [17-27] 20 (11/30 0800) BP: (84-100)/(61-87) 84/64 (11/30 0800) SpO2:  [97 %-100 %] 100 % (11/30 0800) Weight:  [50.8 kg] 50.8 kg (11/30 0500)   Physical Exam  Constitutional: He is oriented to person, only. He appears cachetic and severely malnourished. No distress. He does nod appropriately to questions but does not verbalize.  HENT: disconjugate gaze Mouth/Throat: white patchy coating over portions of tongue continues. Cheeks seem spared.  Cardiovascular: Normal rate, regular rhythm and normal heart sounds. Exam reveals no gallop and no friction rub. No murmur heard.  Pulmonary/Chest: No audible rhonchi today. Effort normal. No respiratory distress. He has no wheezes.  Abdominal: Soft. scaphoid. He exhibits no distension. There is no tenderness.  Buttock: no skin breakdown Lymphadenopathy: He has no cervical adenopathy.  Neurological: He is alert and moves all extremities. Slowed response to questions. Unable to really gauge vision.  Skin: Skin is warm and dry. No rash noted. No erythema.  Ext: wasted extremities Psychiatric: flat   Lab Results Recent Labs    08/24/19 0222 08/25/19 0225  WBC  --  2.0*  HGB  --  11.3*  HCT  --  35.8*  NA 145 143  K 4.3 4.7  CL 116* 116*  CO2 24 21*  BUN 31* 26*  CREATININE 0.95 0.84    Microbiology: reviewed Studies/Results: No results found.   Assessment/Plan: CNS toxo = continue with current regimen of pyrimethamine and sulfaziadine, treatment day 11. He will require at least six week induction treatment then consideration for secondary prophylaxis.   Decreased sensorium =  Brain MRI indicating overall improvement but remark on ?early abscess formation. Will discuss further with neuro rad team/   Severe protein caloric malnutrition = on tube feedings via cortrak. He has refused consideration of PEG. He really needs a more durable route. CIR is following for consideration of admission.   HIV  Disease, +AIDS = treatment on hold while starting toxo treatment. Likely plan to restart regimen this week/early next barring ongoing improvement. Would like to initiate integrase regimen for rapid reduction of viral load, however will need to consider timing in relation to his tube feeds to avoid chelation effect. Will continue Bactrim DS 3x/week for PJP prophylaxis.   Thrush = ongoing with higher dose fluconazole 400 mg daily. Would continue daily dosing for another week and follow. Hopefully we can re-introduce his ARVs soon.   Rhonchi = no fevers, not hypoxic. Encouraged flutter valve/pulmonary hygiene, sitting upright in bed, frequent turning.    Rexene Alberts, MSN, NP-C [facility]  [email or web address] Pager: [phone] Office: [phone] [facility] Main Line: [phone]

## 2019-08-26 NOTE — Progress Notes (Signed)
Occupational Therapy Treatment Patient Details Name: Arthur [name] MRN: [id number] DOB: June 16, 1981 Today's Date: 08/26/2019    History of present illness Pt is a 38 y.o. male admitted 07/28/19 with c/o worsening dizziness, headaches; has not taken HIV medications for ~1 yr due to afforability issues. Brain MRI consistent with opportunistic infection throughout both cerebral hemispheres. Pt also with lost R eye vision. Lumbar puncture 11/3. 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 Transfer to ICU 11/15 with worsening mental and respiratory status. PMH includes untreated HIV, depression, substance abuse.   OT comments  Patient progressing slowly towards OT goals.  Patient demonstrating ability to follow 1 step commands consistently today, given increased time; improved initiation, sequencing and task recall.  Patient continues to require multimodal cueing for sitting balance with poor tolerance to unsupported sitting/standing, but improved from last session.  Requires mod assist +2 to stand from EOB and min assist +2 from stedy surface.  Attempted oral care seated in stedy, but pt unable to complete with out max assist due to fatigue; therapist supporting UE at elbow.  He has great family support at home and updated dc plan to CIR to decrease burden of care prior to dc home.  Will follow acutely.    Follow Up Recommendations  CIR;Supervision/Assistance - 24 hour    Equipment Recommendations  3 in 1 bedside commode    Recommendations for Other Services      Precautions / Restrictions Precautions Precautions: Fall;Other (comment) Precaution Comments: NGT, soft BP Restrictions Weight Bearing Restrictions: No       Mobility Bed Mobility Overal bed mobility: Needs Assistance Bed Mobility: Supine to Sit;Sit to Supine     Supine to sit: Max assist;+2 for safety/equipment Sit to supine: Max assist;+2 for safety/equipment   General bed mobility  comments: pt with improved initation given verbal cueing for B LEs to EOB and reaching for UE support with max assist for trunk elevation, but +2 for safety to block knees and prevent patient from sliding forward   Transfers Overall transfer level: Needs assistance Equipment used: Ambulation equipment used Transfers: Sit to/from Stand Sit to Stand: Mod assist;Min assist;+2 physical assistance;From elevated surface         General transfer comment: ModA+2 to stand from EOB with BUE support pulling on Stedy hand rail, assist to cue hip extension and upright posture. Performed 2x more standing trials from [place] with minA, heavy reliance on UE support; full support of stedy to block bilateral knees    Balance Overall balance assessment: Needs assistance Sitting-balance support: Feet supported;Bilateral upper extremity supported Sitting balance-Leahy Scale: Poor Sitting balance - Comments: Pt initiating reaching and weight shifts with cued, requiring mod-maxA to maintain sitting balance, periods of close min guard to minA but unable to accept challenge Postural control: Right lateral lean;Posterior lean Standing balance support: Bilateral upper extremity supported;During functional activity Standing balance-Leahy Scale: Poor Standing balance comment: with R lateral bias in standing; able to correct towards midline with cueing and reaching                           ADL either performed or assessed with clinical judgement   ADL Overall ADL's : Needs assistance/impaired     Grooming: Oral care;Sitting Grooming Details (indicate cue type and reason): seated EOB oral care attempted with suction swab; pt fatigued and required max-total assist to reach mouth with support at elbows (limited due to fatigue)  Toilet Transfer: +2 for physical assistance;+2 for safety/equipment;Moderate assistance Toilet Transfer Details (indicate cue type and reason): simulated  in steady          Functional mobility during ADLs: Moderate assistance;Minimal assistance;+2 for physical assistance;+2 for safety/equipment(stedy)       Vision   Additional Comments: continue assessment, able to scan when cueing and able to reach given "high fives" to L and R but formal assessment not completed   Perception     Praxis      Cognition Arousal/Alertness: Awake/alert Behavior During Therapy: Flat affect Overall Cognitive Status: Difficult to assess Area of Impairment: Problem solving;Safety/judgement;Following commands;Attention;Awareness                   Current Attention Level: Sustained;Selective   Following Commands: Follows one step commands with increased time Safety/Judgement: Decreased awareness of deficits;Decreased awareness of safety Awareness: Intellectual;Emergent Problem Solving: Slow processing;Requires verbal cues;Requires tactile cues;Decreased initiation;Difficulty sequencing General Comments: Pt with a limited verbalizations this session, mainly communicating with head nods and hand gestures. following most, if not all 1 step commands given increased time and good recall of using stedy from last session        Exercises     Shoulder Instructions       General Comments      Pertinent Vitals/ Pain       Pain Assessment: No/denies pain Faces Pain Scale: No hurt  Home Living                                          Prior Functioning/Environment              Frequency  Min 2X/week        Progress Toward Goals  OT Goals(current goals can now be found in the care plan section)  Progress towards OT goals: Progressing toward goals  Acute Rehab OT Goals Patient Stated Goal: If not CIR, home with family assist OT Goal Formulation: With patient  Plan Frequency remains appropriate;Discharge plan needs to be updated    Co-evaluation    PT/OT/SLP Co-Evaluation/Treatment: Yes Reason for  Co-Treatment: For patient/therapist safety;To address functional/ADL transfers PT goals addressed during session: Mobility/safety with mobility;Balance OT goals addressed during session: ADL's and self-care      AM-PAC OT "6 Clicks" Daily Activity     Outcome Measure   Help from another person eating meals?: Total(NPO) Help from another person taking care of personal grooming?: A Lot Help from another person toileting, which includes using toliet, bedpan, or urinal?: Total Help from another person bathing (including washing, rinsing, drying)?: A Lot Help from another person to put on and taking off regular upper body clothing?: A Lot Help from another person to put on and taking off regular lower body clothing?: Total 6 Click Score: 9    End of Session Equipment Utilized During Treatment: Other (comment)(stedy)  OT Visit Diagnosis: Muscle weakness (generalized) (M62.81)   Activity Tolerance Patient tolerated treatment well   Patient Left in bed;with call bell/phone within reach;with bed alarm set   Nurse Communication Mobility status;Precautions        Time: 2297-9892 OT Time Calculation (min): 30 min  Charges: OT General Charges $OT Visit: 1 Visit OT Treatments $Self Care/Home Management : 8-22 mins  Chancy Milroy, OT Acute Rehabilitation Services Pager [phone] Office [phone]    Chancy Milroy 08/26/2019, 4:13 PM

## 2019-08-26 NOTE — Plan of Care (Signed)
  Problem: Clinical Measurements: Goal: Respiratory complications will improve Outcome: Progressing Goal: Cardiovascular complication will be avoided Outcome: Progressing   Problem: Coping: Goal: Level of anxiety will decrease Outcome: Progressing   Problem: Safety: Goal: Ability to remain free from injury will improve Outcome: Progressing   Problem: Skin Integrity: Goal: Risk for impaired skin integrity will decrease Outcome: Progressing

## 2019-08-26 NOTE — Progress Notes (Signed)
[name]  PROGRESS NOTE    Arthur [name]  SAY:[id number] DOB: [birth year] DOA: 07/28/2019 PCP: Truman Hayward, MD   Brief Narrative:   Arthur [name] a 38 year old male with past medical history significant for HIV, substance abuse, depression and GERD who presented to the emergency department on 11/1 with chief complaint of dizziness, weakness and intermittent headache. He has not taken any of his HIV medications for about 1 year due to affordability issue. MRI brain done in ED showed widespread areas of abnormal low level restricted diffusion and postcontrast enhancement throughout both cerebral hemispheres, predominantly infratentorial, with the dominant abnormality in the left anterior frontal white matter consistent with an opportunistic infection, such as toxoplasmosis, tuberculosis or fungal cerebritis/meningitis, or parasitic infection. Patient was admitted to hospital service and ID was consulted. He then revealed that he has lost his vision in his right eye. ID started him on penicillin and ganciclovir for possible CMV retinitis versus synovitis retinitis and ophthalmology consulted.  11/29: No acute events ON per nursing.  11/30: Some hypotension this AM. Otherwise no acute events.    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 RIPEtx stopped     - Appreciate ID. Completed PCN     - Continue pyrimethamin-leucovorin, sulfadiazine     - remains afebrile; VSS; continue as above   Acute toxic metabolic encephalopathy, multifactorial including CNS infection and SIADH     - Repeat CT head without new process, evolving lesions of cerebellum and left frontal lobe     - MRI brain with multifocal signal abnormality and abnormal contrast enhancement, most consistent with infectious cerebritis, compatible with cerebral toxoplasmosis, no discrete abscess     - no changes; continue as above      - 11/30: per ID: Continue on CNS toxo for a total of 6 wk for induction phase, then switch to chronic phase until CD 4 count improvement  Right eye blindness, optic retinitis/neurititis     - Mainly concerned between syphilis and CMV-butCMV PCR negative in the CNS     - Appreciate ophthalmology      - Received steroid injection 08/02/2019     - Repeat MRIbrain and orbits show improvement of brain lesions and optic neurititis, retinitis     - no chagnes; continue as above  Severehyponatremia     - Likely 2/2severeSIADH     - Transferred to ICU for 3% saline and IV lasix     - Nephrology consulted; signed off 11/20      - resolved; monitor Na+  AKI     - Resolved; monitor SCr  Untreated HIV/Advanced HIV/AIDS Leukopenia/immunocompromised in the setting of untreated HIV Thrush     - Fluconazole for thrush; magic mouthwash    -  ID following, off tx for now      - Bactrim for ppx     - 11/30: per ID: Will start back on his ART regimen, and watch for signs of immune reconstitution. Will place on tivicay, plus descovy which can be crushed. Thrush/eso candidiasis = continue with addn 7 days of high dose fluconazole at 400mg  daily-if not improved in a week to switch to echinocandins  Dysphagia Severe protein calorie malnutrition FTT     - Seen by SLP     - TF managed  by dietitian      - Discussed PEG tube with mom; she is hesitant to agree to a procedure as she sees that he is improving.  She wants to keep monitoring and discussed with SLP team. Patient underwent MBS 11/27     - pt d/w PC; does not want PEG placed; see PC note; to continue d/w family     - 11/30: spoke with family; reports that he will have additional swallow study 12/1; if no improvement, will try to convince pt to take PEG; told her that if he does not have improvement and does not want a PEG, we will need to talk about pleasure feeds and changing CODE status.    DVT prophylaxis: lovenox Code Status: FULL Family Communication: with mother by phone   Disposition Plan: TBD  Consultants:   ID  Palliative Care  Subjective: No acute events ON per nursing.  Objective: Vitals:   08/25/19 2340 08/26/19 0500 08/26/19 0800 08/26/19 1140  BP: 98/87  (!) 84/64 99/67  Pulse: 68  60 74  Resp: 18  20 (!) 22  Temp: 97.6 F (36.4 C)  97.7 F (36.5 C) 98.8 F (37.1 C)  TempSrc: Oral  Axillary Axillary  SpO2: 100%  100% 97%  Weight:  50.8 kg    Height:        Intake/Output Summary (Last 24 hours) at 08/26/2019 1522 Last data filed at 08/25/2019 1629 Gross per 24 hour  Intake -  Output 250 ml  Net -250 ml   Filed Weights   08/24/19 0330 08/25/19 0240 08/26/19 0500  Weight: 51.6 kg 49.7 kg 50.8 kg    Examination:  General: 38 y.o. male resting in bed in NAD Cardiovascular: RRR, +S1, S2, no m/g/r Respiratory: CTABL, no w/r/r GI: BS+, NDNT, soft, coretrak in place MSK: No e/c/c Neuro: alert, active, trying to talk but hard to understand  Data Reviewed: I have personally reviewed following labs and imaging studies.  CBC: Recent Labs  Lab 08/20/19 0249 08/25/19 0225  WBC 3.1* 2.0*  NEUTROABS 1.9  --   HGB 14.0 11.3*  HCT 42.3 35.8*  MCV 90.8 96.0  PLT 288 235   Basic Metabolic Panel: Recent Labs  Lab 08/20/19 0249  08/21/19 1055 08/22/19 0255 08/23/19 0229  08/24/19 0222 08/25/19 0225  NA 141   < > 144 146* 145 145 143  K 4.4  --  3.7 4.8 4.3 4.3 4.7  CL 104  --  110 110 113* 116* 116*  CO2 28  --  25 27 23 24  21*  GLUCOSE 153*  --  149* 134* 105* 120* 113*  BUN 58*  --  55* 46* 40* 31* 26*  CREATININE 1.50*  --  1.40* 1.11 1.05 0.95 0.84  CALCIUM 9.4  --  9.2 9.2 9.3 9.0 8.9  MG 2.8*  --   --   --   --   --   --    < > =  values in this interval not displayed.   GFR: Estimated Creatinine Clearance: 87.4 mL/min (by C-G formula based on SCr of 0.84 mg/dL). Liver Function Tests: Recent Labs  Lab 08/20/19 0249  AST 39  ALT 26  ALKPHOS 51  BILITOT 0.6  PROT 8.7*  ALBUMIN 3.2*   No results for input(s): LIPASE, AMYLASE in the last 168 hours. No results for input(s): AMMONIA in the last 168 hours. Coagulation Profile: No results for input(s): INR, PROTIME in the last 168 hours. Cardiac Enzymes: No results for input(s): CKTOTAL, CKMB, CKMBINDEX, TROPONINI in the last 168 hours. BNP (last 3 results) No results for input(s): PROBNP in the last 8760 hours. HbA1C: No results for input(s): HGBA1C in the last 72 hours. CBG: Recent Labs  Lab 08/25/19 1947 08/25/19 2357 08/26/19 0452 08/26/19 0809 08/26/19 1139  GLUCAP 106* 120* 147* 100* 77   Lipid Profile: No results for input(s): CHOL, HDL, LDLCALC, TRIG, CHOLHDL, LDLDIRECT in the last 72 hours. Thyroid Function Tests: No results for input(s): TSH, T4TOTAL, FREET4, T3FREE, THYROIDAB in the last 72 hours. Anemia Panel: No results for input(s): VITAMINB12, FOLATE, FERRITIN, TIBC, IRON, RETICCTPCT in the last 72 hours. Sepsis Labs: No results for input(s): PROCALCITON, LATICACIDVEN in the last 168 hours.  No results found for this or any previous visit (from the past 240 hour(s)).    Radiology Studies: No results found.   Scheduled Meds: . dolutegravir  50 mg Oral Daily  . emtricitabine-tenofovir AF  1 tablet Oral Daily  . enoxaparin (LOVENOX) injection  40 mg  Subcutaneous Q24H  . famotidine  20 mg Per Tube Daily  . feeding supplement (PRO-STAT SUGAR FREE 64)  30 mL Per Tube Daily  . free water  100 mL Per Tube Q8H  . insulin aspart  0-9 Units Subcutaneous Q4H  . magic mouthwash w/lidocaine  10 mL Oral TID AC & HS  . mouth rinse  15 mL Mouth Rinse BID  . mirtazapine  15 mg Per Tube QHS  . multivitamin with minerals  1 tablet Per Tube Daily  . Pyrimethamine-Leucovorin  50 mg Per Tube Daily  . sulfaDIAZINE  1,000 mg Per Tube Q6H  . sulfamethoxazole-trimethoprim  20 mL Per Tube 3 times weekly  . terbinafine   Topical Daily   Continuous Infusions: . chlorproMAZINE (THORAZINE) IV 12.5 mg (08/09/19 1135)  . feeding supplement (OSMOLITE 1.5 CAL) 1,000 mL (08/26/19 1053)  . fluconazole (DIFLUCAN) IV 400 mg (08/26/19 1035)     LOS: 28 days    Time spent: 35 minutes spent in the coordination of care today.    Teddy Spike, DO [facility] Pager [phone]  If 7PM-7AM, please contact night-coverage [email or web address] Password [email or web address] 08/26/2019, 3:22 PM

## 2019-08-26 NOTE — Progress Notes (Signed)
Nutrition Follow-up  DOCUMENTATION CODES:   Severe malnutrition in context of chronic illness, Underweight  INTERVENTION:   Per Palliative Care notes, pt clearly states he does not want a PEG tube at this time. Recommend continued discussions regarding GOC as an NGT is not a long-term feeding option.  Continue tube feeding via small-bore NGT: - Osmolite 1.5 @ 60 ml/hr (1440 ml/day) - Pro-stat 30 ml daily - Free water per MD, currently 100 ml TID  Tube feeding regimen and free water provides 2260 kcal, 105 grams of protein, and 1394 ml of H2O.   NUTRITION DIAGNOSIS:   Severe Malnutrition related to chronic illness as evidenced by severe muscle depletion, severe fat depletion.  Ongoing, being addressed via TF  GOAL:   Patient will meet greater than or equal to 90% of their needs  Met via TF  MONITOR:   TF tolerance, Diet advancement, Labs, Weight trends  REASON FOR ASSESSMENT:   Consult Enteral/tube feeding initiation and management  ASSESSMENT:   38 y.o. male with medical history significant of untreated HIV, anemia, depression, GERD, substance abuse  Admitted for intracerebral opportunistic infection  11/10 - s/p Cortrak placement 11/18 - s/p small bore feeding tube placement by diagnostic radiology 11/27 - MBS with recommendations for NPO  Per notes, pt has clearly stated that he would not want a PEG tube. Pt remains NPO at this time.  Pt's weight is down a total of 14 lbs since admit. Suspect some of weight loss can be attributed to fluid status. Will continue to monitor trends.  Spoke with RN who reports pt is tolerating TF without issue.  Current TF: Osmolite 1.5 @ 60 ml/hr, Pro-stat 30 ml daily, free water 100 ml TID  Medications reviewed and include: Pepcid, SSI, Remeron, MVI with minerals  Labs reviewed: BUN 26 CBG's: 94-147 x 24 hours  Diet Order:   Diet Order            Diet NPO time specified  Diet effective now              EDUCATION  NEEDS:   Not appropriate for education at this time  Skin:  Skin Assessment: Reviewed RN Assessment  Last BM:  08/22/19  Height:   Ht Readings from Last 1 Encounters:  07/29/19 5' 11"  (1.803 m)    Weight:   Wt Readings from Last 1 Encounters:  08/26/19 50.8 kg    Ideal Body Weight:  78.1 kg  BMI:  Body mass index is 15.62 kg/m.  Estimated Nutritional Needs:   Kcal:  2000-2200  Protein:  100-115g  Fluid:  Fluid Restricted    Gaynell Face, MS, RD, LDN Inpatient Clinical Dietitian Pager: [phone] Weekend/After Hours: [phone]

## 2019-08-27 DIAGNOSIS — R1310 Dysphagia, unspecified: Secondary | ICD-10-CM

## 2019-08-27 DIAGNOSIS — R531 Weakness: Secondary | ICD-10-CM

## 2019-08-27 LAB — RENAL FUNCTION PANEL
Albumin: 2.6 g/dL — ABNORMAL LOW (ref 3.5–5.0)
Anion gap: 9 (ref 5–15)
BUN: 22 mg/dL — ABNORMAL HIGH (ref 6–20)
CO2: 19 mmol/L — ABNORMAL LOW (ref 22–32)
Calcium: 8.6 mg/dL — ABNORMAL LOW (ref 8.9–10.3)
Chloride: 111 mmol/L (ref 98–111)
Creatinine, Ser: 0.8 mg/dL (ref 0.61–1.24)
GFR calc Af Amer: >60 mL/min (ref >60)
GFR calc non Af Amer: >60 mL/min (ref >60)
Glucose, Bld: 103 mg/dL — ABNORMAL HIGH (ref 70–99)
Phosphorus: 3.4 mg/dL (ref 2.5–4.6)
Potassium: 4.7 mmol/L (ref 3.5–5.1)
Sodium: 139 mmol/L (ref 135–145)

## 2019-08-27 LAB — CBC WITH DIFFERENTIAL/PLATELET
Abs Immature Granulocytes: 0.08 10*3/uL — ABNORMAL HIGH (ref 0.00–0.07)
Basophils Absolute: 0 10*3/uL (ref 0.0–0.1)
Basophils Relative: 1 %
Eosinophils Absolute: 0 10*3/uL (ref 0.0–0.5)
Eosinophils Relative: 1 %
HCT: 34.3 % — ABNORMAL LOW (ref 39.0–52.0)
Hemoglobin: 10.9 g/dL — ABNORMAL LOW (ref 13.0–17.0)
Immature Granulocytes: 5 %
Lymphocytes Relative: 31 %
Lymphs Abs: 0.6 10*3/uL — ABNORMAL LOW (ref 0.7–4.0)
MCH: 30.6 pg (ref 26.0–34.0)
MCHC: 31.8 g/dL (ref 30.0–36.0)
MCV: 96.3 fL (ref 80.0–100.0)
Monocytes Absolute: 0.3 10*3/uL (ref 0.1–1.0)
Monocytes Relative: 18 %
Neutro Abs: 0.8 10*3/uL — ABNORMAL LOW (ref 1.7–7.7)
Neutrophils Relative %: 44 %
Platelets: 199 10*3/uL (ref 150–400)
RBC: 3.56 MIL/uL — ABNORMAL LOW (ref 4.22–5.81)
RDW: 14.8 % (ref 11.5–15.5)
WBC: 1.8 10*3/uL — ABNORMAL LOW (ref 4.0–10.5)
nRBC: 0 % (ref 0.0–0.2)

## 2019-08-27 LAB — GLUCOSE, CAPILLARY
Glucose-Capillary: 104 mg/dL — ABNORMAL HIGH (ref 70–99)
Glucose-Capillary: 106 mg/dL — ABNORMAL HIGH (ref 70–99)
Glucose-Capillary: 106 mg/dL — ABNORMAL HIGH (ref 70–99)
Glucose-Capillary: 117 mg/dL — ABNORMAL HIGH (ref 70–99)
Glucose-Capillary: 74 mg/dL (ref 70–99)
Glucose-Capillary: 98 mg/dL (ref 70–99)

## 2019-08-27 LAB — MAGNESIUM: Magnesium: 1.9 mg/dL (ref 1.7–2.4)

## 2019-08-27 NOTE — Progress Notes (Addendum)
Physical Therapy Treatment Patient Details Name: Arthur [name] MRN: [id number] DOB: 10-18-1980 Today's Date: 08/27/2019    History of Present Illness Pt is a 38 y.o. male admitted 07/28/19 with c/o worsening dizziness, headaches; has not taken HIV medications for ~1 yr due to afforability issues. Brain MRI consistent with opportunistic infection throughout both cerebral hemispheres. Pt also with lost R eye vision. Lumbar puncture 11/3. 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 Transfer to ICU 11/15 with worsening mental and respiratory status. PMH includes untreated HIV, depression, substance abuse.   PT Comments    Pt continues to show improvements in cognition, strength and activity tolerance. Tolerated repeated sit<>stand transfers with use of Stedy frame, min-maxA+2 for mobility with improved trunk/cervical extension; pt demonstrating improved initiation/sequencing with less frequent verbal cues. Pt attempting to verbalize more words, although difficult to understand; pt with hard time controlling secretions, but demonstrates purposeful use of suction/yonker with productive cough. Continue to recommend intensive CIR-level therapies to maximize functional mobility and decrease caregiver burden.    Follow Up Recommendations  CIR;Supervision/Assistance - 24 hour(pt/family declining SNF)     Equipment Recommendations  (TBD)    Recommendations for Other Services       Precautions / Restrictions Precautions Precautions: Fall;Other (comment) Precaution Comments: NGT, soft BP Restrictions Weight Bearing Restrictions: No    Mobility  Bed Mobility Overal bed mobility: Needs Assistance Bed Mobility: Supine to Sit;Sit to Supine     Supine to sit: Max assist;+2 for safety/equipment Sit to supine: Max assist   General bed mobility comments: Improved initiation of BLE movement to EOB with minimal verbal cuing, reaching for UE support requiring maxA  for trunk elevation, +2 for safety/balance  Transfers Overall transfer level: Needs assistance Equipment used: Ambulation equipment used Transfers: Sit to/from Stand Sit to Stand: Mod assist;Min assist;+2 physical assistance;From elevated surface         General transfer comment: Performed multiple sit<>stands with Stedy frame, pt requiring range from min-modA+2 with BUE support pulling on Stedy hand rail; full support of Stedy frame to block bilateral knees. Much improved initiation to stand with minimal cues  Ambulation/Gait                 Stairs             Wheelchair Mobility    Modified Rankin (Stroke Patients Only)       Balance     Sitting balance-Leahy Scale: Poor Sitting balance - Comments: Reliant on UE support while sitting in Stedy frame, but improved ability to maintain midline posture without external assist for longer bouts of time (fading with fatigue); continues to require mod-maxA to maintain sitting balance sitting EOB without BUE support     Standing balance-Leahy Scale: Poor Standing balance comment: Heavy reliance on BUE support standing in Stedy frame, improved ability to maintain midline posture with multimodal cuing (fades with fatigue)                            Cognition Arousal/Alertness: Awake/alert Behavior During Therapy: Flat affect Overall Cognitive Status: Difficult to assess Area of Impairment: Problem solving;Safety/judgement;Following commands;Attention;Awareness                   Current Attention Level: Sustained;Selective   Following Commands: Follows one step commands with increased time;Follows one step commands consistently Safety/Judgement: Decreased awareness of deficits;Decreased awareness of safety Awareness: Emergent   General Comments: Pt communicating with  head nods and hand gestures, some verbalizations although difficult to understand. following most, if not all 1 step commands given  increased time and good recall of using stedy from last session. improved initiation requiring less cues for sequencing      Exercises      General Comments General comments (skin integrity, edema, etc.): Soft BP, but VSS with mobility and pt denies dizziness      Pertinent Vitals/Pain Pain Assessment: No/denies pain    Home Living                      Prior Function            PT Goals (current goals can now be found in the care plan section) Progress towards PT goals: Progressing toward goals    Frequency    Min 3X/week      PT Plan Current plan remains appropriate    Co-evaluation              AM-PAC PT "6 Clicks" Mobility   Outcome Measure  Help needed turning from your back to your side while in a flat bed without using bedrails?: A Lot Help needed moving from lying on your back to sitting on the side of a flat bed without using bedrails?: A Lot Help needed moving to and from a bed to a chair (including a wheelchair)?: Total Help needed standing up from a chair using your arms (e.g., wheelchair or bedside chair)?: A Lot Help needed to walk in hospital room?: Total Help needed climbing 3-5 steps with a railing? : Total 6 Click Score: 9    End of Session Equipment Utilized During Treatment: Gait belt Activity Tolerance: Patient tolerated treatment well Patient left: in bed;with call bell/phone within reach;with bed alarm set Nurse Communication: Mobility status;Need for lift equipment PT Visit Diagnosis: Other abnormalities of gait and mobility (R26.89);Muscle weakness (generalized) (M62.81)     Time: 7628-3151 PT Time Calculation (min) (ACUTE ONLY): 26 min  Charges:  $Therapeutic Activity: 8-22 mins $Neuromuscular Re-education: 8-22 mins                    Ina Homes, PT, DPT Acute Rehabilitation Services  Pager [phone] Office [phone]  Malachy Chamber 08/27/2019, 12:49 PM

## 2019-08-27 NOTE — Plan of Care (Signed)

## 2019-08-27 NOTE — Progress Notes (Signed)
Speech Language Pathology Treatment: Dysphagia  Patient Details Name: Arthur [name] MRN: [id number] DOB: 09-01-1981 Today's Date: 08/27/2019 Time: 3295-1884 SLP Time Calculation (min) (ACUTE ONLY): 70 min  Assessment / Plan / Recommendation Clinical Impression  Pt was seen for dysphagia treatment with mother, Arthur [name], present for PO trials. After oral care pt consumed ice chips and small bites of jello. Mod cues were given for oral acceptance as pt worked to open his mouth and wrap his lips around a spoon. Anterior spillage was noted with both consistencies - more significant with ice chips. There was widespread variability with his swallowing; at times, swallowing swiftly but at other times there was a significant delay with Mod cues and extra time needed to elicit hyolaryngeal movement to palpation. Intermittent coughing is noted particularly in these times when he did not swallow swiftly, concerning for aspiration as observed on MBS. Before and after POs he has audible congestion, wet vocal quality, and coughing on his secretions with intermittent ability to cough secretions high enough to retrieve with the yankauer.   I shared my concerns with Ronalee Belts and Arthur [name] about safety and nutritional intake, also sharing the progress that I have seen, which has been slow but notable. Arthur [name] is frustrated because she wishes that if there had better communication, that she could have worked on swallowing exercises with him to get him to a point where he could eat and drink now. She also says that she thinks that the times in which he is not swallowing, it is because he has given up and doesn't want to do it for staff. I tried to share the level of function at the time of our initial evaluation, at which point it seems like his mentation and physical abilities would not have supported significant amount of exercises. She shares that she feels like they are stuck with no choices.  I reiterated several times that a  PEG does not mean that we stop dysphagia therapy, emphasizing again the progress that has been made over the last week. I also talked about it not being a permanent decision, rather one that can be reversed if he continues to make progress and can nutritionally support himself orally. I see per the chart that CIR is considering if he can make enough progress to tolerate their therapies and if they deem him to be a candidate, I question if he could go there with a Cortrak to buy him some more time in dysphagia treatment. In the mean time, SLP will continue to follow acutely with potential to repeat MBS in the future if he begins to show more consistency with his swallow initiation. All questions were answered at this time.   HPI HPI: Pt is a 38 y.o. male admitted with neurosyphillis, CNS toxoplasmosis. MRI 11/1 showed widespread areas concerning for infection, with most dominant abnormality in the L anterior frontal white matter. Hospital course was complicated by hyponatremia and respiratory decline, transferred to ICU on 11/14 but not requiring intubation. Multiple CXRs this admission without acute findings. PMH: substance abuse, HIV, GERD, depression, anxiety, anemia      SLP Plan  Continue with current plan of care       Recommendations  Diet recommendations: NPO Medication Administration: Via alternative means                Oral Care Recommendations: Oral care QID Follow up Recommendations: Inpatient Rehab SLP Visit Diagnosis: Dysphagia, oropharyngeal phase (R13.12) Plan: Continue with current plan of care  GO                Virl Axe Anivea Velasques 08/27/2019, 4:31 PM  Ivar Drape, M.A. CCC-SLP Acute Herbalist [phone] Office [phone]

## 2019-08-27 NOTE — Progress Notes (Signed)
[name].  PROGRESS NOTE    Arthur [name]  ZOX:[id number] DOB: 08/14/1981 DOA: 07/28/2019 PCP: Randall HissVan Dam, Cornelius N, MD   Brief Narrative:   Arthur [name] a 38 year old male with past medical history significant for HIV, substance abuse, depression and GERD who presented to the emergency department on 11/1 with chief complaint of dizziness, weakness and intermittent headache. He has not taken any of his HIV medications for about 1 year due to affordability issue. MRI brain done in ED showed widespread areas of abnormal low level restricted diffusion and postcontrast enhancement throughout both cerebral hemispheres, predominantly infratentorial, with the dominant abnormality in the left anterior frontal white matter consistent with an opportunistic infection, such as toxoplasmosis, tuberculosis or fungal cerebritis/meningitis, or parasitic infection. Patient was admitted to hospital service and ID was consulted. He then revealed that he has lost his vision in his right eye. ID started him on penicillin and ganciclovir for possible CMV retinitis versus synovitis retinitis and ophthalmology consulted.  11/29: No acute events ON per nursing. 11/30: Some hypotension this AM. Otherwise no acute events.  12/1Sherron Monday: Spoke with SLP. 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 RIPEtx stopped - Appreciate ID. Completed PCN - Continue pyrimethamin-leucovorin, sulfadiazine - remains afebrile; VSS; continue as above   Acute toxic metabolic encephalopathy, multifactorial including CNS infection and SIADH - Repeat CT head without new process, evolving lesions of cerebellum and left frontal lobe - MRI brain with multifocal signal abnormality and abnormal contrast enhancement, most consistent with infectious cerebritis, compatible with cerebral toxoplasmosis, no discrete abscess - no changes; continue as above      - 11/30: per ID: Continue on CNS toxo for a total of 6 wk for induction phase, then switch to chronic phase until CD 4 count improvement     - 12/1; stable, continue as above  Right eye blindness, optic retinitis/neurititis - Mainly concerned between syphilis and CMV-butCMV PCR negative in the CNS - Appreciate ophthalmology  - Received steroid injection 08/02/2019 - Repeat MRIbrain and orbits show improvement of brain lesions and optic neurititis, retinitis - no chagnes; continue as above  Severehyponatremia - Likely 2/2severeSIADH - Transferred to ICU for 3% saline and IV lasix - Nephrology consulted; signed off 11/20  - resolved; monitor Na+  AKI - Resolved; monitor SCr  Untreated HIV/Advanced HIV/AIDS Leukopenia/immunocompromised in the setting of untreated HIV Thrush - Fluconazole for thrush; magic mouthwash  - ID following, off tx for now  - Bactrim for ppx     - 11/30: per ID: Will start back on his ART regimen, and watch for signs of immune reconstitution. Will place on tivicay, plus descovy which can be crushed. Thrush/eso candidiasis = continue with addn 7 days of high dose fluconazole at  400mg   daily-if not improved in a week to switch to echinocandins     - 12/1: stable, continue as above.   Dysphagia Severe protein calorie malnutrition FTT - Seen by SLP - TF managed by dietitian  - Discussed PEG tube with mom; she is hesitant to agree to a procedure as she sees that he is improving. She wants to keep monitoring and discussed with SLP team. Patient underwent MBS 11/27 - pt d/w PC; does not want PEG placed; see PC note; to continue d/w family     - 11/30: spoke with family; reports that he will have additional swallow study 12/1; if no improvement, will try to convince pt to take PEG; told her that if he does not have improvement and does not want a PEG, we will need to talk about pleasure feeds and changing CODE status.       - 12/1: spoke with SLP; mother to attend therapy session today to get understanding of pt's condition; need decision PEG vs pleasure feeds/CC; after speaking with SLP, seems as if there is a disconnect with the pt's understanding of his own condition. She points out to his that food is in his mouth, but he says it's already swallowed. ?Capacity for decision making.  DVT prophylaxis: lovenox Code Status: FULL   Disposition Plan: TBD  Consultants:   ID  Palliative Care  Subjective: No acute events ON. Much more active today.   Objective: Vitals:   08/27/19 0730 08/27/19 0747 08/27/19 1115 08/27/19 1200  BP:  92/66 (!) 88/70 92/68  Pulse:  73 65 72  Resp: 19 19 18  (!) 22  Temp:  (!) 97.3 F (36.3 C) 98.7 F (37.1 C)   TempSrc:  Oral Oral   SpO2:  97% 100% 99%  Weight:      Height:        Intake/Output Summary (Last 24 hours) at 08/27/2019 1422 Last data filed at 08/27/2019 1300 Gross per 24 hour  Intake 3662.22 ml  Output 1050 ml  Net 2612.22 ml   Filed Weights   08/25/19 0240 08/26/19 0500 08/27/19 0357  Weight: 49.7 kg 50.8 kg 51.5 kg    Examination:  General: 38 y.o. male resting in bed in NAD Cardiovascular:  RRR, +s1s2, no mgr Respiratory: CTABL, no w/r/r, normal WOB GI: BS+, NDNT, coretrak in place MSK: No e/c/c Neuro: follows commands   Data Reviewed: I have personally reviewed following labs and imaging studies.  CBC: Recent Labs  Lab 08/25/19 0225 08/27/19 0248  WBC 2.0* 1.8*  NEUTROABS  --  0.8*  HGB 11.3* 10.9*  HCT 35.8* 34.3*  MCV 96.0 96.3  PLT 235 199   Basic Metabolic Panel: Recent Labs  Lab 08/22/19 0255 08/23/19 0229 08/24/19 0222 08/25/19 0225 08/27/19 0248  NA 146* 145 145 143 139  K 4.8 4.3 4.3 4.7 4.7  CL 110 113* 116* 116* 111  CO2 27 23 24  21* 19*  GLUCOSE 134* 105* 120* 113* 103*  BUN 46* 40* 31* 26* 22*  CREATININE 1.11 1.05 0.95 0.84 0.80  CALCIUM 9.2 9.3 9.0 8.9 8.6*  MG  --   --   --   --  1.9  PHOS  --   --   --   --  3.4   GFR: Estimated Creatinine Clearance: 93 mL/min (by C-G formula based on SCr of 0.8 mg/dL). Liver Function Tests: Recent Labs  Lab 08/27/19 0248  ALBUMIN 2.6*   No results for input(s): LIPASE, AMYLASE in  the last 168 hours. No results for input(s): AMMONIA in the last 168 hours. Coagulation Profile: No results for input(s): INR, PROTIME in the last 168 hours. Cardiac Enzymes: No results for input(s): CKTOTAL, CKMB, CKMBINDEX, TROPONINI in the last 168 hours. BNP (last 3 results) No results for input(s): PROBNP in the last 8760 hours. HbA1C: No results for input(s): HGBA1C in the last 72 hours. CBG: Recent Labs  Lab 08/26/19 2004 08/26/19 2340 08/27/19 0407 08/27/19 0744 08/27/19 1113  GLUCAP 117* 117* 106* 106* 104*   Lipid Profile: No results for input(s): CHOL, HDL, LDLCALC, TRIG, CHOLHDL, LDLDIRECT in the last 72 hours. Thyroid Function Tests: No results for input(s): TSH, T4TOTAL, FREET4, T3FREE, THYROIDAB in the last 72 hours. Anemia Panel: No results for input(s): VITAMINB12, FOLATE, FERRITIN, TIBC, IRON, RETICCTPCT in the last 72 hours. Sepsis Labs: No results for input(s): PROCALCITON,  LATICACIDVEN in the last 168 hours.  No results found for this or any previous visit (from the past 240 hour(s)).    Radiology Studies: No results found.   Scheduled Meds: . dolutegravir  50 mg Oral Daily  . emtricitabine-tenofovir AF  1 tablet Oral Daily  . enoxaparin (LOVENOX) injection  40 mg Subcutaneous Q24H  . famotidine  20 mg Per Tube Daily  . feeding supplement (PRO-STAT SUGAR FREE 64)  30 mL Per Tube Daily  . free water  100 mL Per Tube Q8H  . insulin aspart  0-9 Units Subcutaneous Q4H  . magic mouthwash w/lidocaine  10 mL Oral TID AC & HS  . mouth rinse  15 mL Mouth Rinse BID  . mirtazapine  15 mg Per Tube QHS  . multivitamin with minerals  1 tablet Per Tube Daily  . Pyrimethamine-Leucovorin  50 mg Per Tube Daily  . sulfaDIAZINE  1,000 mg Per Tube Q6H  . sulfamethoxazole-trimethoprim  20 mL Per Tube 3 times weekly  . terbinafine   Topical Daily   Continuous Infusions: . chlorproMAZINE (THORAZINE) IV 12.5 mg (08/09/19 1135)  . feeding supplement (OSMOLITE 1.5 CAL) 60 mL/hr at 08/27/19 1300  . fluconazole (DIFLUCAN) IV 100 mL/hr at 08/27/19 1300     LOS: 29 days    Time spent: 25 minutes spent in the coordination of care today.    Jonnie Finner, DO [facility] Pager [phone]  If 7PM-7AM, please contact night-coverage [email or web address] Password [email or web address] 08/27/2019, 2:22 PM

## 2019-08-28 ENCOUNTER — Inpatient Hospital Stay (HOSPITAL_COMMUNITY): Payer: Medicaid Other

## 2019-08-28 DIAGNOSIS — B370 Candidal stomatitis: Secondary | ICD-10-CM

## 2019-08-28 DIAGNOSIS — B589 Toxoplasmosis, unspecified: Secondary | ICD-10-CM

## 2019-08-28 DIAGNOSIS — E43 Unspecified severe protein-calorie malnutrition: Secondary | ICD-10-CM

## 2019-08-28 DIAGNOSIS — Z79899 Other long term (current) drug therapy: Secondary | ICD-10-CM

## 2019-08-28 LAB — CBC WITH DIFFERENTIAL/PLATELET
Abs Immature Granulocytes: 0.15 10*3/uL — ABNORMAL HIGH (ref 0.00–0.07)
Basophils Absolute: 0 10*3/uL (ref 0.0–0.1)
Basophils Relative: 1 %
Eosinophils Absolute: 0 10*3/uL (ref 0.0–0.5)
Eosinophils Relative: 2 %
HCT: 33.8 % — ABNORMAL LOW (ref 39.0–52.0)
Hemoglobin: 11.3 g/dL — ABNORMAL LOW (ref 13.0–17.0)
Immature Granulocytes: 7 %
Lymphocytes Relative: 27 %
Lymphs Abs: 0.5 10*3/uL — ABNORMAL LOW (ref 0.7–4.0)
MCH: 31 pg (ref 26.0–34.0)
MCHC: 33.4 g/dL (ref 30.0–36.0)
MCV: 92.6 fL (ref 80.0–100.0)
Monocytes Absolute: 0.3 10*3/uL (ref 0.1–1.0)
Monocytes Relative: 17 %
Neutro Abs: 1 10*3/uL — ABNORMAL LOW (ref 1.7–7.7)
Neutrophils Relative %: 46 %
Platelets: 240 10*3/uL (ref 150–400)
RBC: 3.65 MIL/uL — ABNORMAL LOW (ref 4.22–5.81)
RDW: 14.8 % (ref 11.5–15.5)
WBC: 2 10*3/uL — ABNORMAL LOW (ref 4.0–10.5)
nRBC: 0 % (ref 0.0–0.2)

## 2019-08-28 LAB — RENAL FUNCTION PANEL
Albumin: 2.5 g/dL — ABNORMAL LOW (ref 3.5–5.0)
Anion gap: 7 (ref 5–15)
BUN: 20 mg/dL (ref 6–20)
CO2: 22 mmol/L (ref 22–32)
Calcium: 8.8 mg/dL — ABNORMAL LOW (ref 8.9–10.3)
Chloride: 109 mmol/L (ref 98–111)
Creatinine, Ser: 0.93 mg/dL (ref 0.61–1.24)
GFR calc Af Amer: >60 mL/min (ref >60)
GFR calc non Af Amer: >60 mL/min (ref >60)
Glucose, Bld: 100 mg/dL — ABNORMAL HIGH (ref 70–99)
Phosphorus: 3.2 mg/dL (ref 2.5–4.6)
Potassium: 4.3 mmol/L (ref 3.5–5.1)
Sodium: 138 mmol/L (ref 135–145)

## 2019-08-28 LAB — GLUCOSE, CAPILLARY
Glucose-Capillary: 110 mg/dL — ABNORMAL HIGH (ref 70–99)
Glucose-Capillary: 112 mg/dL — ABNORMAL HIGH (ref 70–99)
Glucose-Capillary: 132 mg/dL — ABNORMAL HIGH (ref 70–99)
Glucose-Capillary: 50 mg/dL — ABNORMAL LOW (ref 70–99)
Glucose-Capillary: 79 mg/dL (ref 70–99)
Glucose-Capillary: 79 mg/dL (ref 70–99)
Glucose-Capillary: 88 mg/dL (ref 70–99)
Glucose-Capillary: 99 mg/dL (ref 70–99)

## 2019-08-28 LAB — MAGNESIUM: Magnesium: 2 mg/dL (ref 1.7–2.4)

## 2019-08-28 IMAGING — DX DG CHEST 1V PORT
1 series · 1 of 1 positions shown · non-contrast
Comparison: [DATE]

CLINICAL DATA: Dyspnea, cough, congestion

EXAM:
PORTABLE CHEST 1 VIEW

[chest ap]
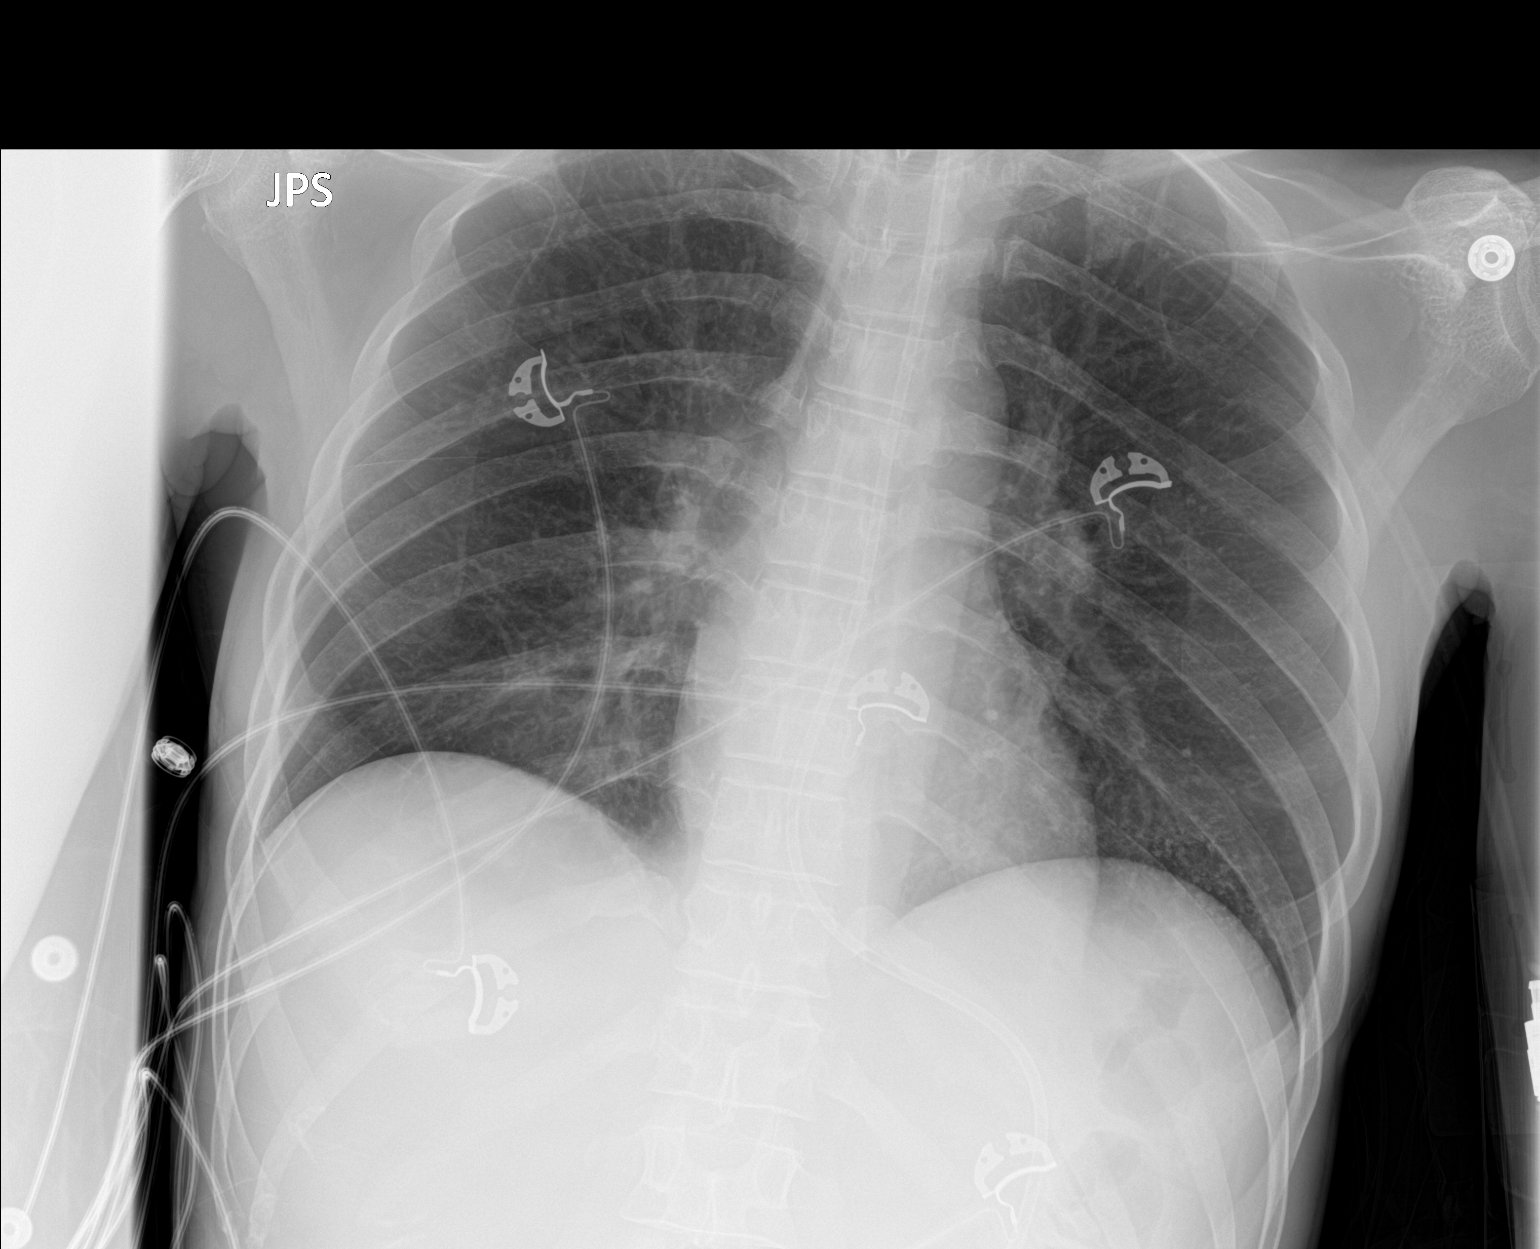

[1 of 1 positions shown; findings below may reference images not displayed]

FINDINGS: The heart size and mediastinal contours are within normal limits.
Mildly increased patchy density at the right lung base. No pleural
effusion or pneumothorax. The visualized skeletal structures are
unremarkable. Enteric tube passes into the stomach.
IMPRESSION: Small patchy atelectasis/consolidation at the right base.

## 2019-08-28 MED ORDER — IPRATROPIUM-ALBUTEROL 0.5-2.5 (3) MG/3ML IN SOLN: 3.0000 mL | RESPIRATORY_TRACT | Status: DC | PRN

## 2019-08-28 MED ORDER — FUROSEMIDE 10 MG/ML IJ SOLN
20.0000 mg | Freq: Once | INTRAMUSCULAR | Status: AC
  Administered 2019-08-28: 20 mg via INTRAVENOUS
  Filled 2019-08-28: qty 2

## 2019-08-28 MED ORDER — DEXTROSE 50 % IV SOLN
INTRAVENOUS | Status: AC
  Administered 2019-08-28: 50 mL
  Filled 2019-08-28: qty 50

## 2019-08-28 NOTE — Progress Notes (Signed)
Hypoglycemic Event  CBG: 50  Treatment: D50 25 mL (12.5 gm)  Symptoms: None  Follow-up CBG: Time:5:30 CBG Result:132   Possible Reasons for Event: Inadequate meal intake  Comments/MD notified:[email or web address]    Arthur [name] I

## 2019-08-28 NOTE — Progress Notes (Signed)
Inpatient Rehabilitation-Admissions Coordinator   Met with pt and his mother bedside. Discussed that at this time, pt is too weak to tolerate CIR level program but if he is able to progress and show greater endurance and engagement with therapies, he may be become appropriate. We also discussed that whether or not he will be considered will be based on his activity tolerance once he is deemed medically ready to be DC'd from his acute hospital stay. Pt's mother verbalized understanding; AC unsure of pt's full understanding of in-depth conversation.   Will continue to follow at a distance.   Jhonnie Garner, OTR/L  Rehab Admissions Coordinator  [phone] 08/28/2019 5:17 PM

## 2019-08-28 NOTE — Progress Notes (Signed)
Feeding pump replaced working well.

## 2019-08-28 NOTE — Progress Notes (Signed)
[facility]    Date of Admission:  07/28/2019   Total days of antibiotics 27        Day 23 Fluconazole        Day 13 Pyrimethamine + sulfadiazine         Bactrim ppx          Day 3 Tivicay + Descovy    ID: Arthur [name] is a 38 y.o. male with  Advanced hiv disease, found to have CNS infections with syphilis (treatment completed) and toxoplasmosis both + on CSF  Principal Problem:   Toxoplasmosis Active Problems:   Neurosyphilis   Acquired immunodeficiency syndrome (HCC)   Vision loss   Abnormal brain MRI   Nausea and vomiting   Bradycardia   Hyponatremia   Palliative care encounter   Thrush of mouth and esophagus (HCC)   Goals of care, counseling/discussion   Protein-calorie malnutrition, severe   Weakness generalized   Dysphagia   Aspiration into airway   Palliative care by specialist   DNR (do not resuscitate) discussion    Subjective: Indicates he is doing better today but coughing a lot still. Ginette Pitman is better. States that he can now see out of right eye.    Medications:  . dolutegravir  50 mg Oral Daily  . emtricitabine-tenofovir AF  1 tablet Oral Daily  . enoxaparin (LOVENOX) injection  40 mg Subcutaneous Q24H  . famotidine  20 mg Per Tube Daily  . feeding supplement (PRO-STAT SUGAR FREE 64)  30 mL Per Tube Daily  . free water  100 mL Per Tube Q8H  . insulin aspart  0-9 Units Subcutaneous Q4H  . magic mouthwash w/lidocaine  10 mL Oral TID AC & HS  . mouth rinse  15 mL Mouth Rinse BID  . mirtazapine  15 mg Per Tube QHS  . multivitamin with minerals  1 tablet Per Tube Daily  . Pyrimethamine-Leucovorin  50 mg Per Tube Daily  . sulfaDIAZINE  1,000 mg Per Tube Q6H  . sulfamethoxazole-trimethoprim  20 mL Per Tube 3 times weekly  . terbinafine   Topical Daily    Objective: Vital signs in last 24 hours: Temp:  [97.6 F (36.4 C)-98.4 F (36.9 C)] 98.4 F (36.9 C) (12/02 1036) Pulse Rate:  [63-78] 74 (12/02 1036) Resp:   [18-27] 25 (12/02 1036) BP: (87-97)/(55-75) 95/66 (12/02 1036) SpO2:  [95 %-100 %] 95 % (12/02 1036) Weight:  [51 kg] 51 kg (12/02 0447)   Physical Exam  Constitutional: Difficult to appreciate orientation; He is cachetic and severely malnourished. No distress. He does nod appropriately to questions and attempts to vocalize but very soft spoken   HENT: disconjugate gaze however blinks to threat today.  Mouth/Throat: Tongue is pink, moist and clear today   Cardiovascular: Normal rate, regular rhythm and normal heart sounds. Exam reveals no gallop and no friction rub. No murmur heard.  Pulmonary/Chest: + Rhonchi with poor cough effort. Breathing effort normal. No respiratory distress. He has no wheezes.  Abdominal: Soft. scaphoid. He exhibits no distension. There is no tenderness.  Buttock: no skin breakdown Lymphadenopathy: He has no cervical adenopathy.  Neurological: He is alert and moves all extremities. Slowed response to questions. Skin: Skin is warm and dry. No rash noted. No erythema.  Ext: wasted extremities. Can raise to gravity  Psychiatric: flat   Lab Results Recent Labs    08/27/19 0248 08/28/19 0205  WBC 1.8* 2.0*  HGB 10.9* 11.3*  HCT 34.3*  33.8*  NA 139 138  K 4.7 4.3  CL 111 109  CO2 19* 22  BUN 22* 20  CREATININE 0.80 0.93    Microbiology: reviewed Studies/Results: Dg Chest Port 1 View  Result Date: 08/28/2019 CLINICAL DATA:  Dyspnea, cough, congestion EXAM: PORTABLE CHEST 1 VIEW COMPARISON:  November 27 20 FINDINGS: The heart size and mediastinal contours are within normal limits. Mildly increased patchy density at the right lung base. No pleural effusion or pneumothorax. The visualized skeletal structures are unremarkable. Enteric tube passes into the stomach. IMPRESSION: Small patchy atelectasis/consolidation at the right base. Electronically Signed   By: Macy Mis M.D.   On: 08/28/2019 12:18     Assessment/Plan: CNS toxo = continue with current  regimen of pyrimethamine and sulfaziadine, treatment day 11. He will require at least six weeks of current treatment then secondary prophylaxis until his immune system recovers. His reflex/reactions seem to be improving and can follow through on tasks faster than previous exams.   R Vision Loss = reports that he can now see, although to what degree I could not ascertain. He does blink to threat and seems to be able to align gaze better today.   Severe protein caloric malnutrition = on tube feedings via cortrak. He has refused consideration of PEG. He really needs a more durable route. CIR is following for consideration of admission.   HIV Disease, +AIDS = Resumed tivicay + descovy. No findings concerning for IRIS on exam today. Will reassess CD4 and VL after 4 weeks of therapy. Continue Bactrim for ppx.   Thrush = improving with higher dose of Fluconazole. Would continue 400 mg daily for another 7 days then stop.   Rhonchi = no fevers, not hypoxic. Encouraged flutter valve/pulmonary hygiene, sitting upright in bed, frequent turning.   Will continue to follow intermittently - uncertain what his discharge plan will be at this time. Would be ideal SNF candidate. ?[place], MSN, NP-C [facility] [email or web address] Pager: [phone] Office: [phone] [place]: [phone]

## 2019-08-28 NOTE — Progress Notes (Signed)
Feeding pump alarms are off more often " Error". Feeding tube changed, cortrak easy to flush. Requested for another feeding pump, awaiting availability.

## 2019-08-28 NOTE — Plan of Care (Signed)

## 2019-08-28 NOTE — Progress Notes (Signed)
Patient ID: Arthur [name], male   DOB: 02/24/1981, 38 y.o.   MRN: [id number]  PROGRESS NOTE    MENNO VANBERGEN  WUJ:[id number] DOB: [birth year] DOA: 07/28/2019 PCP: Randall Hiss, MD   Brief Narrative:  38 year old male with history of HIV, substance abuse, depression and GERD presented on 07/28/2019 with dizziness, weakness and intermittent headache with noncompliance to his HIV medications for about 1 year due to affordability issue.  MRI of the brain in the ED showed widespread areas of abnormal low level restricted diffusion and postcontrast enhancement throughout both cerebral hemispheres, predominantly infratentorial with the dominant abnormality in the left anterior frontal white matter consistent with an opportunistic infection, such as toxoplasmosis, tuberculosis or fungal cerebritis/meningitis, or parasitic infection.  He was admitted; ID was consulted.  He then revealed that he had lost his vision in his right eye.  He was started on penicillin and ganciclovir for possible CMV retinitis versus synovitis.  Ophthalmology was consulted.  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  Assessment & Plan:   CNS toxoplasmosis AIDS/untreated HIV Oral thrush -ID following.  Currently on pyrimethamine-leucovorin, sulfadiazine.  Continue induction phase treatment for total of 6 weeks, then switch to chronic phase as per ID. -Patient has been started on antiretroviral therapy: Continue tivicay plus Descovy -Continue high-dose Diflucan for 7 days -Continue Bactrim for prophylaxis -CSF was positive for VDRL.  Patient has completed penicillin treatment -Positive for CMV: Initially treated with ganciclovir which has subsequently been discontinued with negative CNS PCR -TB Gold plus negative.  MTB probe negative.  RIPE treatment stopped  Acute toxic/metabolic encephalopathy -Multifactorial including CNS infection and SIADH -Repeat CT head without new process, evolving lesions  of cerebellum and left frontal lobe -MRI brain with multifocal signal abnormality and abnormal contrast-enhancement, most consistent with infectious cerebritis, consistent with cerebral toxoplasmosis, no discrete abscess -Monitor mental status.  Right eye blindness, optic retinitis/neuritis -Patient has been evaluated by ophthalmology.  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  Was treated with 3% saline and IV Lasix.  Nephrology consulted and subsequently signed off on 08/16/2019 -Resolved.  Sodium stable.  Acute kidney injury -Resolved.  Stable  Dysphagia Severe protein calorie malnutrition/AIDS wasting syndrome -Tube feeding managed by dietitian.  SLP following and currently recommending n.p.o. -Patient/mother hesitant about PEG tube so far.  Will follow further recommendations from SLP.  Eventually will need PEG tube probably   DVT prophylaxis: Lovenox Code Status: Full Family Communication: None at bedside Disposition Plan: Depends on clinical outcome  Consultants: ID/palliative care/ophthalmology/critical care/nephrology  Procedures: LP  Antimicrobials:  Anti-infectives (From admission, onward)   Start     Dose/Rate Route Frequency Ordered Stop   08/26/19 1600  dolutegravir (TIVICAY) tablet 50 mg     50 mg Oral Daily 08/26/19 1426     08/26/19 1600  emtricitabine-tenofovir AF (DESCOVY) 200-25 MG per tablet 1 tablet     1 tablet Oral Daily 08/26/19 1426     08/23/19 0900  sulfamethoxazole-trimethoprim (BACTRIM) 200-40 MG/5ML suspension 20 mL  Status:  Discontinued     20 mL Oral 3 times weekly 08/21/19 0950 08/21/19 1311   08/23/19 0900  sulfamethoxazole-trimethoprim (BACTRIM) 200-40 MG/5ML suspension 20 mL     20 mL Per Tube 3 times weekly 08/21/19 1311     08/21/19 2000  Pyrimethamine-Leucovorin 50-25 MG CAPS 50 mg     50  mg  Per Tube Daily 08/21/19 1312     08/21/19 1100  fluconazole (DIFLUCAN) IVPB 400 mg     400 mg 100 mL/hr over 120 Minutes Intravenous Every 24 hours 08/21/19 1009     08/20/19 1200  sulfaDIAZINE tablet 1,000 mg     1,000 mg Per Tube Every 6 hours 08/20/19 1118     08/19/19 1015  sulfamethoxazole-trimethoprim (BACTRIM DS) 800-160 MG per tablet 1 tablet  Status:  Discontinued     1 tablet Oral 3 times weekly 08/19/19 1002 08/21/19 0950   08/16/19 2000  Pyrimethamine-Leucovorin 50-25 MG CAPS 50 mg  Status:  Discontinued     50 mg Oral Daily 08/15/19 1337 08/15/19 1357   08/16/19 2000  Pyrimethamine-Leucovorin 50-25 MG CAPS 50 mg  Status:  Discontinued     50 mg Oral Daily 08/15/19 1357 08/21/19 1312   08/15/19 2000  Pyrimethamine-Leucovorin 50-25 MG CAPS 200 mg  Status:  Discontinued     200 mg Oral  Once 08/15/19 1337 08/15/19 1357   08/15/19 2000  Pyrimethamine-Leucovorin 50-25 MG CAPS 200 mg     200 mg Oral  Once 08/15/19 1357 08/15/19 2114   08/15/19 1800  sulfaDIAZINE tablet 1,000 mg  Status:  Discontinued     1,000 mg Oral Every 6 hours 08/15/19 1337 08/20/19 1118   08/14/19 1000  demeclocycline (DECLOMYCIN) tablet 150 mg  Status:  Discontinued     150 mg Oral Every 12 hours 08/14/19 0848 08/14/19 0907   08/14/19 1000  demeclocycline (DECLOMYCIN) tablet 150 mg  Status:  Discontinued     150 mg Per Tube Every 12 hours 08/14/19 0907 08/19/19 1030   08/14/19 1000  fluconazole (DIFLUCAN) tablet 200 mg     200 mg Per Tube Daily 08/14/19 0907 08/18/19 1055   08/13/19 1015  fluconazole (DIFLUCAN) tablet 200 mg  Status:  Discontinued     200 mg Oral Daily 08/13/19 1009 08/14/19 0907   08/13/19 1000  penicillin g benzathine (BICILLIN LA) 1200000 UNIT/2ML injection 2.4 Million Units     2.4 Million Units Intramuscular  Once 08/08/19 1019 08/13/19 1222   08/12/19 1400  sulfamethoxazole-trimethoprim (BACTRIM) 272.48 mg in dextrose 5 % 500 mL IVPB  Status:  Discontinued     10 mg/kg/day  54.5  kg 344.7 mL/hr over 90 Minutes Intravenous Every 12 hours 08/12/19 0854 08/15/19 1337   08/11/19 1400  sulfamethoxazole-trimethoprim (BACTRIM) 400-80 MG per tablet 3 tablet  Status:  Discontinued     3 tablet Oral Every 8 hours 08/11/19 1349 08/12/19 0854   08/10/19 1000  abacavir-dolutegravir-lamiVUDine (TRIUMEQ) 600-50-300 MG per tablet 1 tablet  Status:  Discontinued     1 tablet Oral Daily 08/09/19 1514 08/12/19 0848   08/10/19 1000  sulfamethoxazole-trimethoprim (BACTRIM) 160 mg in dextrose 5 % 250 mL IVPB  Status:  Discontinued     160 mg 260 mL/hr over 60 Minutes Intravenous Daily 08/09/19 1514 08/11/19 1324   08/09/19 1730  metroNIDAZOLE (FLAGYL) IVPB 500 mg     500 mg 100 mL/hr over 60 Minutes Intravenous Every 8 hours 08/09/19 1636 08/13/19 1848   08/08/19 1000  rifampin (RIFADIN) 60 mg/mL oral suspension 600 mg  Status:  Discontinued     600 mg Per Tube Daily 08/07/19 1106 08/07/19 1319   08/08/19 1000  bictegravir-emtricitabine-tenofovir AF (BIKTARVY) 50-200-25 MG per tablet 1 tablet  Status:  Discontinued     1 tablet Oral Daily 08/07/19 1419 08/09/19 1514   08/07/19 1330  bictegravir-emtricitabine-tenofovir AF (BIKTARVY)  50-200-25 MG per tablet 1 tablet  Status:  Discontinued     1 tablet Oral Daily 08/07/19 1319 08/07/19 1419   08/07/19 1000  ethambutol (MYAMBUTOL) tablet 1,200 mg  Status:  Discontinued     1,200 mg Per Tube Daily 08/06/19 1423 08/07/19 1319   08/07/19 1000  isoniazid (NYDRAZID) tablet 300 mg  Status:  Discontinued     300 mg Per Tube Daily 08/06/19 1423 08/07/19 1319   08/07/19 1000  rifampin (RIFADIN) 60 mg/mL oral suspension 600 mg  Status:  Discontinued     600 mg Oral Daily 08/06/19 1423 08/07/19 1106   08/07/19 1000  sulfamethoxazole-trimethoprim (BACTRIM DS) 800-160 MG per tablet 1 tablet  Status:  Discontinued     1 tablet Per Tube Daily 08/06/19 1423 08/09/19 1514   08/07/19 1000  pyrazinamide tablet 1,500 mg  Status:  Discontinued     1,500 mg Per  Tube Daily 08/06/19 1423 08/07/19 1319   08/03/19 1600  fluconazole (DIFLUCAN) IVPB 200 mg  Status:  Discontinued     200 mg 100 mL/hr over 60 Minutes Intravenous Every 24 hours 08/03/19 1548 08/13/19 1009   07/30/19 1700  rifampin (RIFADIN) capsule 600 mg  Status:  Discontinued     600 mg Oral Daily 07/30/19 1604 08/06/19 1423   07/30/19 1700  isoniazid (NYDRAZID) tablet 300 mg  Status:  Discontinued     300 mg Oral Daily 07/30/19 1604 08/06/19 1423   07/30/19 1700  pyrazinamide tablet 1,500 mg  Status:  Discontinued     1,500 mg Oral Daily 07/30/19 1604 08/06/19 1423   07/30/19 1700  ethambutol (MYAMBUTOL) tablet 1,200 mg  Status:  Discontinued     1,200 mg Oral Daily 07/30/19 1604 08/06/19 1423   07/29/19 1700  sulfamethoxazole-trimethoprim (BACTRIM DS) 800-160 MG per tablet 1 tablet  Status:  Discontinued     1 tablet Oral Daily 07/29/19 1601 08/06/19 1423   07/29/19 1700  ganciclovir (CYTOVENE) 285 mg in sodium chloride 0.9 % 100 mL IVPB  Status:  Discontinued     5 mg/kg  57 kg 100 mL/hr over 60 Minutes Intravenous Every 12 hours 07/29/19 1603 08/07/19 1526   07/29/19 1630  penicillin G potassium 12 Million Units in dextrose 5 % 250 mL IVPB  Status:  Discontinued     12 Million Units 250 mL/hr over 60 Minutes Intravenous Every 12 hours 07/29/19 1552 07/29/19 1559   07/29/19 1630  penicillin G potassium 12 Million Units in dextrose 5 % 500 mL continuous infusion     12 Million Units 41.7 mL/hr over 12 Hours Intravenous Every 12 hours 07/29/19 1559 08/12/19 2359       Subjective: Patient seen and examined at bedside.  He is awake but hardly answers any questions.  No overnight fever or vomiting reported.  Objective: Vitals:   08/28/19 0004 08/28/19 0447 08/28/19 0700 08/28/19 1036  BP: (!) 87/68 (!)  Pulse: 63 65 75 74  Resp: (!) 25  Temp: 97.7 F (36.5 C) 97.6 F (36.4 C) 97.9 F (36.6 C) 98.4 F (36.9 C)  TempSrc: Axillary Axillary Axillary  Axillary  SpO2: 100% 98% 98% 95%  Weight:  51 kg    Height:        Intake/Output Summary (Last 24 hours) at 08/28/2019 1100 Last data filed at 08/28/2019 0500 Gross per 24 hour  Intake 1412.22 ml  Output 875 ml  Net 537.22 ml   Filed Weights   08/26/19  0500 08/27/19 0357 08/28/19 0447  Weight: 50.8 kg 51.5 kg 51 kg    Examination:  General exam: Awake, hardly answers any questions.  Does not follow command much  today.  No acute distress.  Cortrak in place Respiratory system: Bilateral decreased breath sounds at bases with some scattered crackles Cardiovascular system: S1 & S2 heard, Rate controlled Gastrointestinal system: Abdomen is nondistended, soft and nontender. Normal bowel sounds heard. Extremities: No cyanosis, clubbing, edema    Data Reviewed: I have personally reviewed following labs and imaging studies  CBC: Recent Labs  Lab 08/25/19 0225 08/27/19 0248 08/28/19 0205  WBC 2.0* 1.8* 2.0*  NEUTROABS  --  0.8* 1.0*  HGB 11.3* 10.9* 11.3*  HCT 35.8* 34.3* 33.8*  MCV 96.0 96.3 92.6  PLT 235 199 240   Basic Metabolic Panel: Recent Labs  Lab 08/23/19 0229 08/24/19 0222 08/25/19 0225 08/27/19 0248 08/28/19 0205  NA 145 145 143 139 138  K 4.3 4.3 4.7 4.7 4.3  CL 113* 116* 116* 111 109  CO2 23 24 21* 19* 22  GLUCOSE 105* 120* 113* 103* 100*  BUN 40* 31* 26* 22* 20  CREATININE 1.05 0.95 0.84 0.80 0.93  CALCIUM 9.3 9.0 8.9 8.6* 8.8*  MG  --   --   --  1.9 2.0  PHOS  --   --   --  3.4 3.2   GFR: Estimated Creatinine Clearance: 79.2 mL/min (by C-G formula based on SCr of 0.93 mg/dL). Liver Function Tests: Recent Labs  Lab 08/27/19 0248 08/28/19 0205  ALBUMIN 2.6* 2.5*   No results for input(s): LIPASE, AMYLASE in the last 168 hours. No results for input(s): AMMONIA in the last 168 hours. Coagulation Profile: No results for input(s): INR, PROTIME in the last 168 hours. Cardiac Enzymes: No results for input(s): CKTOTAL, CKMB, CKMBINDEX, TROPONINI in  the last 168 hours. BNP (last 3 results) No results for input(s): PROBNP in the last 8760 hours. HbA1C: No results for input(s): HGBA1C in the last 72 hours. CBG: Recent Labs  Lab 08/27/19 1707 08/27/19 2014 08/28/19 0006 08/28/19 0449 08/28/19 0834  GLUCAP 98 74 79   79 110* 112*   Lipid Profile: No results for input(s): CHOL, HDL, LDLCALC, TRIG, CHOLHDL, LDLDIRECT in the last 72 hours. Thyroid Function Tests: No results for input(s): TSH, T4TOTAL, FREET4, T3FREE, THYROIDAB in the last 72 hours. Anemia Panel: No results for input(s): VITAMINB12, FOLATE, FERRITIN, TIBC, IRON, RETICCTPCT in the last 72 hours. Sepsis Labs: No results for input(s): PROCALCITON, LATICACIDVEN in the last 168 hours.  No results found for this or any previous visit (from the past 240 hour(s)).       Radiology Studies: No results found.      Scheduled Meds:  dolutegravir  50 mg Oral Daily   emtricitabine-tenofovir AF  1 tablet Oral Daily   enoxaparin (LOVENOX) injection  40 mg Subcutaneous Q24H   famotidine  20 mg Per Tube Daily   feeding supplement (PRO-STAT SUGAR FREE 64)  30 mL Per Tube Daily   free water  100 mL Per Tube Q8H   insulin aspart  0-9 Units Subcutaneous Q4H   magic mouthwash w/lidocaine  10 mL Oral TID AC & HS   mouth rinse  15 mL Mouth Rinse BID   mirtazapine  15 mg Per Tube QHS   multivitamin with minerals  1 tablet Per Tube Daily   Pyrimethamine-Leucovorin  50 mg Per Tube Daily   sulfaDIAZINE  1,000 mg Per Tube Q6H  sulfamethoxazole-trimethoprim  20 mL Per Tube 3 times weekly   terbinafine   Topical Daily   Continuous Infusions:  chlorproMAZINE (THORAZINE) IV 12.5 mg (08/09/19 1135)   feeding supplement (OSMOLITE 1.5 CAL) 1,000 mL (08/28/19 0213)   fluconazole (DIFLUCAN) IV 400 mg (08/28/19 1035)          Glade LloydKshitiz Elta Angell, MD [facility] 08/28/2019, 11:00 AM

## 2019-08-29 LAB — GLUCOSE, CAPILLARY
Glucose-Capillary: 101 mg/dL — ABNORMAL HIGH (ref 70–99)
Glucose-Capillary: 103 mg/dL — ABNORMAL HIGH (ref 70–99)
Glucose-Capillary: 110 mg/dL — ABNORMAL HIGH (ref 70–99)
Glucose-Capillary: 122 mg/dL — ABNORMAL HIGH (ref 70–99)
Glucose-Capillary: 128 mg/dL — ABNORMAL HIGH (ref 70–99)
Glucose-Capillary: 81 mg/dL (ref 70–99)
Glucose-Capillary: 88 mg/dL (ref 70–99)

## 2019-08-29 MED ORDER — ADULT MULTIVITAMIN LIQUID CH
15.0000 mL | Freq: Every day | ORAL | Status: DC
  Administered 2019-08-29 – 2019-09-07 (×9): 15 mL
  Filled 2019-08-29 (×10): qty 15

## 2019-08-29 MED ORDER — OSMOLITE 1.5 CAL PO LIQD
1000.0000 mL | ORAL | Status: DC
  Administered 2019-08-29 – 2019-09-01 (×5): 1000 mL
  Filled 2019-08-29 (×11): qty 1000

## 2019-08-29 NOTE — Progress Notes (Signed)
CPT not done with patient this evening. Patient alseep and did not respond to numerous calls. Will resume in the AM.

## 2019-08-29 NOTE — Plan of Care (Signed)

## 2019-08-29 NOTE — Progress Notes (Signed)
Noted that every time magic mouthwash is given , pt started to cough and get congested. MD made aware, med. D/ced.

## 2019-08-29 NOTE — Progress Notes (Addendum)
Physical Therapy Treatment Patient Details Name: Arthur [name] MRN: [id number] DOB: 1981/05/09 Today's Date: 08/29/2019    History of Present Illness Pt is a 38 y.o. male admitted 07/28/19 with c/o worsening dizziness, headaches; has not taken HIV medications for ~1 yr due to afforability issues. Brain MRI consistent with opportunistic infection throughout both cerebral hemispheres. Pt also with lost R eye vision. Lumbar puncture 11/3. 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 Transfer to ICU 11/15 with worsening mental and respiratory status. PMH includes untreated HIV, depression, substance abuse.   PT Comments    Pt continues to progress with mobility. Continuing to work on seated and standing balance with Stedy frame to block bilateral knees; use of mirror for visual feedback, able to wash hands at sink with assist; using RUE to assist weaker LUE without cues. Pt seems more alert this session, attempting to verbalize more and even smiled. Continue to feel pt would be an excellent candidate for intensive CIR-level therapies to maximize functional mobility and decrease caregiver burden. Plan to perform standing/transfer training without Stedy next session.    Follow Up Recommendations  CIR;Supervision/Assistance - 24 hour(pt/family declined SNF)     Equipment Recommendations  (TBD)    Recommendations for Other Services       Precautions / Restrictions Precautions Precautions: Fall;Other (comment) Precaution Comments: Cortrak, soft BP Restrictions Weight Bearing Restrictions: No    Mobility  Bed Mobility Overal bed mobility: Needs Assistance Bed Mobility: Supine to Sit;Sit to Supine     Supine to sit: Max assist Sit to supine: Max assist   General bed mobility comments: Improved initiation of BLE movement to EOB without cues, reaching for UE support requiring maxA for trunk elevation  Transfers Overall transfer level: Needs  assistance Equipment used: Ambulation equipment used Transfers: Sit to/from Stand Sit to Stand: Mod assist;Min assist;+2 physical assistance;From elevated surface         General transfer comment: Performed multiple sit<>stands with Stedy frame, pt requiring range from min-modA+2 with BUE support pulling on Stedy hand rail; full support of Stedy frame to block bilateral knees. Pt with difficulty reaching for LUE support, using R hand to lift L hand to stedy arm rail  Ambulation/Gait                 Stairs             Wheelchair Mobility    Modified Rankin (Stroke Patients Only)       Balance Overall balance assessment: Needs assistance   Sitting balance-Leahy Scale: Poor Sitting balance - Comments: Reliant on UE support while sitting in Stedy frame; sat in stedy in front of mirror for pt to use visual feedback to achieve midline posture/realize when he is leaning       Standing balance comment: Reliant on BUE support standing in Stedy frame with knees blocked, improved ability to maintain midline posture with multimodal cues                            Cognition Arousal/Alertness: Awake/alert Behavior During Therapy: WFL for tasks assessed/performed;Flat affect                                   General Comments: Pt seemingly more alert this session, smiled at joke. Increased verbalizations (although difficult to understand), still mainly communicating with head nods. Following majority  of one-step commands with increased time; continues to initiate well. Some increased cues for initiation this session      Exercises      General Comments General comments (skin integrity, edema, etc.): Soft BP stable during session (post-mobility BP 94/71, HR 76)      Pertinent Vitals/Pain Pain Assessment: No/denies pain    Home Living                      Prior Function            PT Goals (current goals can now be found in the  care plan section) Progress towards PT goals: Progressing toward goals    Frequency    Min 3X/week      PT Plan Current plan remains appropriate    Co-evaluation              AM-PAC PT "6 Clicks" Mobility   Outcome Measure  Help needed turning from your back to your side while in a flat bed without using bedrails?: A Lot Help needed moving from lying on your back to sitting on the side of a flat bed without using bedrails?: A Lot Help needed moving to and from a bed to a chair (including a wheelchair)?: A Lot Help needed standing up from a chair using your arms (e.g., wheelchair or bedside chair)?: A Lot Help needed to walk in hospital room?: Total Help needed climbing 3-5 steps with a railing? : Total 6 Click Score: 10    End of Session Equipment Utilized During Treatment: Gait belt Activity Tolerance: Patient tolerated treatment well Patient left: in bed;with call bell/phone within reach;with bed alarm set Nurse Communication: Mobility status;Need for lift equipment PT Visit Diagnosis: Other abnormalities of gait and mobility (R26.89);Muscle weakness (generalized) (M62.81)     Time: 3818-2993 PT Time Calculation (min) (ACUTE ONLY): 25 min  Charges:  $Therapeutic Activity: 8-22 mins $Neuromuscular Re-education: 8-22 mins                    Ina Homes, PT, DPT Acute Rehabilitation Services  Pager [phone] Office [phone]  Malachy Chamber 08/29/2019, 12:58 PM

## 2019-08-29 NOTE — Progress Notes (Signed)
Nutrition Follow-up  DOCUMENTATION CODES:   Severe malnutrition in context of chronic illness, Underweight  INTERVENTION:   Increase tube feeding via small-bore NGT: - Osmolite 1.5 @ 65 ml/hr (1560 ml/day) - Pro-stat 30 ml daily - Free water per MD, currently 100 ml TID  Tube feeding regimen and current free water flushes provide 2440 kcal, 113 grams of protein, and 1489 ml of H2O (meeting >100% of kcal needs, 100% of protein needs).  NUTRITION DIAGNOSIS:   Severe Malnutrition related to chronic illness as evidenced by severe muscle depletion, severe fat depletion.  Ongoing, being addressed via TF  GOAL:   Patient will meet greater than or equal to 90% of their needs  Met via TF  MONITOR:   TF tolerance, Diet advancement, Labs, Weight trends  REASON FOR ASSESSMENT:   Consult Enteral/tube feeding initiation and management  ASSESSMENT:   38 y.o. male with medical history significant of untreated HIV, anemia, depression, GERD, substance abuse  Admitted for intracerebral opportunistic infection  11/10 - s/p Cortrak placement 11/18 - s/p small bore feeding tube placement by diagnostic radiology 11/27 - MBS with recommendations for NPO  RD was notified that pt's mother had questions regarding pt's TF regimen. Spoke with pt's mother at length via phone call. Pt's mother asking why she hears pt's stomach growling, why pt is reporting he is hungry, how we know pt is receiving adequate nutrition, etc. All questions answered to the best of this RD's ability. Pt's mother reports she plans to pursue PEG option and has questions regarding how pt's TF will change once PEG is placed. Discussed transition from continuous to bolus tube feeds.  RD will plan to increase pt's continuous tube feeds to promote continued weight maintenance and also promote weight gain which is favorable given severe malnutrition.  Pt remains NPO. Per SLP notes, pt making slow progress towards diet  advancement.  Weight stable over the last 2 weeks with fluctuations between 109-113 lbs. Will continue to monitor trends.  Discussed change in TF order with RN.  Reached out to MD regarding Remeron and purpose for this medication given one of the side effects is increased appetite and pt's mother reports pt has been saying he is hungry.  Current TF: Osmolite 1.5 @ 60 ml/hr, Pro-stat 30 ml daily, free water 100 ml TID  Medications reviewed and include: Pepcid, SSI, Remeron, liquid MVI  Labs reviewed. CBG's: 50-132 x 24 hours  Diet Order:   Diet Order            Diet NPO time specified  Diet effective now              EDUCATION NEEDS:   Not appropriate for education at this time  Skin:  Skin Assessment: Reviewed RN Assessment  Last BM:  08/29/19 large type 6  Height:   Ht Readings from Last 1 Encounters:  07/29/19 5' 11"  (1.803 m)    Weight:   Wt Readings from Last 1 Encounters:  08/29/19 50.4 kg    Ideal Body Weight:  78.1 kg  BMI:  Body mass index is 15.5 kg/m.  Estimated Nutritional Needs:   Kcal:  2000-2200  Protein:  100-115g  Fluid:  Fluid Restricted    Gaynell Face, MS, RD, LDN Inpatient Clinical Dietitian Pager: [phone] Weekend/After Hours: [phone]

## 2019-08-29 NOTE — Progress Notes (Signed)
  Speech Language Pathology Treatment: Dysphagia  Patient Details Name: Arthur [name] MRN: [id number] DOB: [birth year] Today's Date: 08/29/2019 Time: 4098-1191 SLP Time Calculation (min) (ACUTE ONLY): 20 min  Assessment / Plan / Recommendation Clinical Impression  Pt was seen for dysphagia treatment and family education with mother, Arthur [name], present again today for session.  Asked his mother to review with me her understanding of her son's swallowing.  She stated that did not realize that he has been aspirating (chart review reveals that this information and the severity of Arthur [name]'s dysphagia has been reviewed with her by SLP multiple times.) We pulled up images from his MBS and looked at the videos revealing the widespread weakness and ultimate aspiration.  She acknowledged understanding.  Arthur [name] was repositioned - showed his mother how to facilitate mandibular opening, tongue extension, and exhalation through pursed lips so that she has a few more exercises she can help him with in the evenings.  She demonstrated teach-back. We discussed that these movements were some of the early, fundamental steps necessary to resume swallowing, but that he is still in the early phases of recovery.  Trials of ice chips were attempted with consistent performance as other sessions - assist was required to hold head in neutral position; pt able to retrieve ice from spoon. There was anterior, passive spillage. He masticated the ice with effort, and eventually achieved a palpable swallow, followed by consistent coughing - these incidents were identified to his mother as examples of likely aspiration.  She verbalized that she had recently decided to pursue a PEG, and that she intends to take Arthur [name] home.    SLP will continue to follow and work toward Arthur [name].     HPI HPI: Pt is a 38 y.o. male admitted with neurosyphillis, CNS toxoplasmosis. MRI 11/1 showed widespread areas concerning for infection, with most dominant  abnormality in the L anterior frontal white matter. Hospital course was complicated by hyponatremia and respiratory decline, transferred to ICU on 11/14 but not requiring intubation. Multiple CXRs this admission without acute findings. PMH: substance abuse, HIV, GERD, depression, anxiety, anemia      SLP Plan  Continue with current plan of care       Recommendations  Diet recommendations: NPO Medication Administration: Via alternative means                Oral Care Recommendations: Oral care QID Follow up Recommendations: Inpatient Rehab SLP Visit Diagnosis: Dysphagia, oropharyngeal phase (R13.12) Plan: Continue with current plan of care       GO                Juan Quam Laurice 08/29/2019, 4:48 PM  Keya Wynes L. Tivis Ringer, [place] Office number [phone] Pager [phone]

## 2019-08-29 NOTE — Progress Notes (Signed)
Patient ID: Arthur [name], male   DOB: 05/05/1981, 38 y.o.   MRN: [id number]  PROGRESS NOTE    Arthur [name]  [id number] DOB: 12/01/1980 DOA: 07/28/2019 PCP: Randall HissVan Dam, Cornelius N, MD   Brief Narrative:  38 year old male with history of HIV, substance abuse, depression and GERD presented on 07/28/2019 with dizziness, weakness and intermittent headache with noncompliance to his HIV medications for about 1 year due to affordability issue.  MRI of the brain in the ED showed widespread areas of abnormal low level restricted diffusion and postcontrast enhancement throughout both cerebral hemispheres, predominantly infratentorial with the dominant abnormality in the left anterior frontal white matter consistent with an opportunistic infection, such as toxoplasmosis, tuberculosis or fungal cerebritis/meningitis, or parasitic infection.  He was admitted; ID was consulted.  He then revealed that he had lost his vision in his right eye.  He was started on penicillin and ganciclovir for possible CMV retinitis versus synovitis.  Ophthalmology was consulted.  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  Assessment & Plan:   CNS toxoplasmosis AIDS/untreated HIV Oral thrush -ID following.  Currently on pyrimethamine-leucovorin, sulfadiazine.  Continue induction phase treatment for total of 6 weeks, then switch to chronic phase as per ID. -Patient has been started on antiretroviral therapy: Continue tivicay plus Descovy -Continue high-dose Diflucan for 7 days -Continue Bactrim for prophylaxis -CSF was positive for VDRL.  Patient has completed penicillin treatment -Positive for CMV: Initially treated with ganciclovir which has subsequently been discontinued with negative CNS PCR -TB Gold plus negative.  MTB probe negative.  RIPE treatment stopped  Acute toxic/metabolic encephalopathy -Multifactorial including CNS infection and SIADH -Repeat CT head without new process, evolving lesions  of cerebellum and left frontal lobe -MRI brain with multifocal signal abnormality and abnormal contrast-enhancement, most consistent with infectious cerebritis, consistent with cerebral toxoplasmosis, no discrete abscess -Monitor mental status.  Right eye blindness, optic retinitis/neuritis -Patient has been evaluated by ophthalmology.  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  Was treated with 3% saline and IV Lasix.  Nephrology consulted and subsequently signed off on 08/16/2019 -Resolved.  Sodium stable.  Acute kidney injury -Resolved.  Stable  Dysphagia Severe protein calorie malnutrition/AIDS wasting syndrome -Tube feeding managed by dietitian.  SLP following and currently recommending n.p.o. -Patient/mother still hesitant about PEG tube so far.  Will follow further recommendations from SLP.  Eventually will need PEG tube probably   DVT prophylaxis: Lovenox Code Status: Full Family Communication: Spoke to mother on phone on 08/28/2019  disposition Plan: Depends on clinical outcome  Consultants: ID/palliative care/ophthalmology/critical care/nephrology  Procedures: LP  Antimicrobials:  Anti-infectives (From admission, onward)   Start     Dose/Rate Route Frequency Ordered Stop   08/26/19 1600  dolutegravir (TIVICAY) tablet 50 mg     50 mg Oral Daily 08/26/19 1426     08/26/19 1600  emtricitabine-tenofovir AF (DESCOVY) 200-25 MG per tablet 1 tablet     1 tablet Oral Daily 08/26/19 1426     08/23/19 0900  sulfamethoxazole-trimethoprim (BACTRIM) 200-40 MG/5ML suspension 20 mL  Status:  Discontinued     20 mL Oral 3 times weekly 08/21/19 0950 08/21/19 1311   08/23/19 0900  sulfamethoxazole-trimethoprim (BACTRIM) 200-40 MG/5ML suspension 20 mL     20 mL Per Tube 3 times weekly 08/21/19 1311     08/21/19 2000  Pyrimethamine-Leucovorin  50-25 MG CAPS  50 mg     50 mg Per Tube Daily 08/21/19 1312     08/21/19 1100  fluconazole (DIFLUCAN) IVPB 400 mg     400 mg 100 mL/hr over 120 Minutes Intravenous Every 24 hours 08/21/19 1009     08/20/19 1200  sulfaDIAZINE tablet 1,000 mg     1,000 mg Per Tube Every 6 hours 08/20/19 1118     08/19/19 1015  sulfamethoxazole-trimethoprim (BACTRIM DS) 800-160 MG per tablet 1 tablet  Status:  Discontinued     1 tablet Oral 3 times weekly 08/19/19 1002 08/21/19 0950   08/16/19 2000  Pyrimethamine-Leucovorin 50-25 MG CAPS 50 mg  Status:  Discontinued     50 mg Oral Daily 08/15/19 1337 08/15/19 1357   08/16/19 2000  Pyrimethamine-Leucovorin 50-25 MG CAPS 50 mg  Status:  Discontinued     50 mg Oral Daily 08/15/19 1357 08/21/19 1312   08/15/19 2000  Pyrimethamine-Leucovorin 50-25 MG CAPS 200 mg  Status:  Discontinued     200 mg Oral  Once 08/15/19 1337 08/15/19 1357   08/15/19 2000  Pyrimethamine-Leucovorin 50-25 MG CAPS 200 mg     200 mg Oral  Once 08/15/19 1357 08/15/19 2114   08/15/19 1800  sulfaDIAZINE tablet 1,000 mg  Status:  Discontinued     1,000 mg Oral Every 6 hours 08/15/19 1337 08/20/19 1118   08/14/19 1000  demeclocycline (DECLOMYCIN) tablet 150 mg  Status:  Discontinued     150 mg Oral Every 12 hours 08/14/19 0848 08/14/19 0907   08/14/19 1000  demeclocycline (DECLOMYCIN) tablet 150 mg  Status:  Discontinued     150 mg Per Tube Every 12 hours 08/14/19 0907 08/19/19 1030   08/14/19 1000  fluconazole (DIFLUCAN) tablet 200 mg     200 mg Per Tube Daily 08/14/19 0907 08/18/19 1055   08/13/19 1015  fluconazole (DIFLUCAN) tablet 200 mg  Status:  Discontinued     200 mg Oral Daily 08/13/19 1009 08/14/19 0907   08/13/19 1000  penicillin g benzathine (BICILLIN LA) 1200000 UNIT/2ML injection 2.4 Million Units     2.4 Million Units Intramuscular  Once 08/08/19 1019 08/13/19 1222   08/12/19 1400  sulfamethoxazole-trimethoprim (BACTRIM) 272.48 mg in dextrose 5 % 500 mL IVPB  Status:   Discontinued     10 mg/kg/day  54.5 kg 344.7 mL/hr over 90 Minutes Intravenous Every 12 hours 08/12/19 0854 08/15/19 1337   08/11/19 1400  sulfamethoxazole-trimethoprim (BACTRIM) 400-80 MG per tablet 3 tablet  Status:  Discontinued     3 tablet Oral Every 8 hours 08/11/19 1349 08/12/19 0854   08/10/19 1000  abacavir-dolutegravir-lamiVUDine (TRIUMEQ) 814-48-185 MG per tablet 1 tablet  Status:  Discontinued     1 tablet Oral Daily 08/09/19 1514 08/12/19 0848   08/10/19 1000  sulfamethoxazole-trimethoprim (BACTRIM) 160 mg in dextrose 5 % 250 mL IVPB  Status:  Discontinued     160 mg 260 mL/hr over 60 Minutes Intravenous Daily 08/09/19 1514 08/11/19 1324   08/09/19 1730  metroNIDAZOLE (FLAGYL) IVPB 500 mg     500 mg 100 mL/hr over 60 Minutes Intravenous Every 8 hours 08/09/19 1636 08/13/19 1848   08/08/19 1000  rifampin (RIFADIN) 60 mg/mL oral suspension 600 mg  Status:  Discontinued     600 mg Per Tube Daily 08/07/19 1106 08/07/19 1319   08/08/19 1000  bictegravir-emtricitabine-tenofovir AF (BIKTARVY) 50-200-25 MG per tablet 1 tablet  Status:  Discontinued     1 tablet Oral Daily 08/07/19 1419 08/09/19 1514  08/07/19 1330  bictegravir-emtricitabine-tenofovir AF (BIKTARVY) 50-200-25 MG per tablet 1 tablet  Status:  Discontinued     1 tablet Oral Daily 08/07/19 1319 08/07/19 1419   08/07/19 1000  ethambutol (MYAMBUTOL) tablet 1,200 mg  Status:  Discontinued     1,200 mg Per Tube Daily 08/06/19 1423 08/07/19 1319   08/07/19 1000  isoniazid (NYDRAZID) tablet 300 mg  Status:  Discontinued     300 mg Per Tube Daily 08/06/19 1423 08/07/19 1319   08/07/19 1000  rifampin (RIFADIN) 60 mg/mL oral suspension 600 mg  Status:  Discontinued     600 mg Oral Daily 08/06/19 1423 08/07/19 1106   08/07/19 1000  sulfamethoxazole-trimethoprim (BACTRIM DS) 800-160 MG per tablet 1 tablet  Status:  Discontinued     1 tablet Per Tube Daily 08/06/19 1423 08/09/19 1514   08/07/19 1000  pyrazinamide tablet 1,500 mg   Status:  Discontinued     1,500 mg Per Tube Daily 08/06/19 1423 08/07/19 1319   08/03/19 1600  fluconazole (DIFLUCAN) IVPB 200 mg  Status:  Discontinued     200 mg 100 mL/hr over 60 Minutes Intravenous Every 24 hours 08/03/19 1548 08/13/19 1009   07/30/19 1700  rifampin (RIFADIN) capsule 600 mg  Status:  Discontinued     600 mg Oral Daily 07/30/19 1604 08/06/19 1423   07/30/19 1700  isoniazid (NYDRAZID) tablet 300 mg  Status:  Discontinued     300 mg Oral Daily 07/30/19 1604 08/06/19 1423   07/30/19 1700  pyrazinamide tablet 1,500 mg  Status:  Discontinued     1,500 mg Oral Daily 07/30/19 1604 08/06/19 1423   07/30/19 1700  ethambutol (MYAMBUTOL) tablet 1,200 mg  Status:  Discontinued     1,200 mg Oral Daily 07/30/19 1604 08/06/19 1423   07/29/19 1700  sulfamethoxazole-trimethoprim (BACTRIM DS) 800-160 MG per tablet 1 tablet  Status:  Discontinued     1 tablet Oral Daily 07/29/19 1601 08/06/19 1423   07/29/19 1700  ganciclovir (CYTOVENE) 285 mg in sodium chloride 0.9 % 100 mL IVPB  Status:  Discontinued     5 mg/kg  57 kg 100 mL/hr over 60 Minutes Intravenous Every 12 hours 07/29/19 1603 08/07/19 1526   07/29/19 1630  penicillin G potassium 12 Million Units in dextrose 5 % 250 mL IVPB  Status:  Discontinued     12 Million Units 250 mL/hr over 60 Minutes Intravenous Every 12 hours 07/29/19 1552 07/29/19 1559   07/29/19 1630  penicillin G potassium 12 Million Units in dextrose 5 % 500 mL continuous infusion     12 Million Units 41.7 mL/hr over 12 Hours Intravenous Every 12 hours 07/29/19 1559 08/12/19 2359       Subjective: Patient seen and examined at bedside.  He is awake but hardly answers any questions.  Very poor historian.  No overnight vomiting or fevers reported.  Objective: Vitals:   08/28/19 1700 08/28/19 1944 08/28/19 2333 08/29/19 0336  BP:   (!) 87/59 (!) 89/64  Pulse:  91 77 69  Resp:  (!) 30 (!) 25 20  Temp: 99.7 F (37.6 C)  97.6 F (36.4 C) 98 F (36.7 C)   TempSrc: Oral  Axillary Oral  SpO2:  98%    Weight:    50.4 kg  Height:        Intake/Output Summary (Last 24 hours) at 08/29/2019 0725 Last data filed at 08/29/2019 0400 Gross per 24 hour  Intake 1320 ml  Output 300 ml  Net 1020  ml   Filed Weights   08/27/19 0357 08/28/19 0447 08/29/19 0336  Weight: 51.5 kg 51 kg 50.4 kg    Examination:  General exam: No distress.  Awake, hardly answers any questions.  Does not follow command much  today.  Cortrak in place Respiratory system: Bilateral decreased breath sounds at bases with some scattered crackles.  Intermittent tachypnea. Cardiovascular system: Rate controlled, S1-S2 heard Gastrointestinal system: Abdomen is nondistended, soft and nontender. Normal bowel sounds heard. Extremities: No cyanosis, edema   Data Reviewed: I have personally reviewed following labs and imaging studies  CBC: Recent Labs  Lab 08/25/19 0225 08/27/19 0248 08/28/19 0205  WBC 2.0* 1.8* 2.0*  NEUTROABS  --  0.8* 1.0*  HGB 11.3* 10.9* 11.3*  HCT 35.8* 34.3* 33.8*  MCV 96.0 96.3 92.6  PLT 235 199 240   Basic Metabolic Panel: Recent Labs  Lab 08/23/19 0229 08/24/19 0222 08/25/19 0225 08/27/19 0248 08/28/19 0205  NA 145 145 143 139 138  K 4.3 4.3 4.7 4.7 4.3  CL 113* 116* 116* 111 109  CO2 23 24 21* 19* 22  GLUCOSE 105* 120* 113* 103* 100*  BUN 40* 31* 26* 22* 20  CREATININE 1.05 0.95 0.84 0.80 0.93  CALCIUM 9.3 9.0 8.9 8.6* 8.8*  MG  --   --   --  1.9 2.0  PHOS  --   --   --  3.4 3.2   GFR: Estimated Creatinine Clearance: 78.3 mL/min (by C-G formula based on SCr of 0.93 mg/dL). Liver Function Tests: Recent Labs  Lab 08/27/19 0248 08/28/19 0205  ALBUMIN 2.6* 2.5*   No results for input(s): LIPASE, AMYLASE in the last 168 hours. No results for input(s): AMMONIA in the last 168 hours. Coagulation Profile: No results for input(s): INR, PROTIME in the last 168 hours. Cardiac Enzymes: No results for input(s): CKTOTAL, CKMB,  CKMBINDEX, TROPONINI in the last 168 hours. BNP (last 3 results) No results for input(s): PROBNP in the last 8760 hours. HbA1C: No results for input(s): HGBA1C in the last 72 hours. CBG: Recent Labs  Lab 08/28/19 1646 08/28/19 1734 08/28/19 2031 08/28/19 2355 08/29/19 0338  GLUCAP 50* 132* 88 101* 128*   Lipid Profile: No results for input(s): CHOL, HDL, LDLCALC, TRIG, CHOLHDL, LDLDIRECT in the last 72 hours. Thyroid Function Tests: No results for input(s): TSH, T4TOTAL, FREET4, T3FREE, THYROIDAB in the last 72 hours. Anemia Panel: No results for input(s): VITAMINB12, FOLATE, FERRITIN, TIBC, IRON, RETICCTPCT in the last 72 hours. Sepsis Labs: No results for input(s): PROCALCITON, LATICACIDVEN in the last 168 hours.  No results found for this or any previous visit (from the past 240 hour(s)).       Radiology Studies: Dg Chest Port 1 View  Result Date: 08/28/2019 CLINICAL DATA:  Dyspnea, cough, congestion EXAM: PORTABLE CHEST 1 VIEW COMPARISON:  November 27 20 FINDINGS: The heart size and mediastinal contours are within normal limits. Mildly increased patchy density at the right lung base. No pleural effusion or pneumothorax. The visualized skeletal structures are unremarkable. Enteric tube passes into the stomach. IMPRESSION: Small patchy atelectasis/consolidation at the right base. Electronically Signed   By: Guadlupe Spanish M.D.   On: 08/28/2019 12:18        Scheduled Meds: . dolutegravir  50 mg Oral Daily  . emtricitabine-tenofovir AF  1 tablet Oral Daily  . enoxaparin (LOVENOX) injection  40 mg Subcutaneous Q24H  . famotidine  20 mg Per Tube Daily  . feeding supplement (PRO-STAT SUGAR FREE 64)  30 mL Per Tube Daily  . free water  100 mL Per Tube Q8H  . insulin aspart  0-9 Units Subcutaneous Q4H  . magic mouthwash w/lidocaine  10 mL Oral TID AC & HS  . mouth rinse  15 mL Mouth Rinse BID  . mirtazapine  15 mg Per Tube QHS  . multivitamin with minerals  1 tablet Per  Tube Daily  . Pyrimethamine-Leucovorin  50 mg Per Tube Daily  . sulfaDIAZINE  1,000 mg Per Tube Q6H  . sulfamethoxazole-trimethoprim  20 mL Per Tube 3 times weekly  . terbinafine   Topical Daily   Continuous Infusions: . chlorproMAZINE (THORAZINE) IV 12.5 mg (08/09/19 1135)  . feeding supplement (OSMOLITE 1.5 CAL) 1,000 mL (08/29/19 0336)  . fluconazole (DIFLUCAN) IV 400 mg (08/28/19 1035)          Glade Lloyd, MD [facility] 08/29/2019, 7:25 AM

## 2019-08-30 DIAGNOSIS — M6258 Muscle wasting and atrophy, not elsewhere classified, other site: Secondary | ICD-10-CM

## 2019-08-30 DIAGNOSIS — B379 Candidiasis, unspecified: Secondary | ICD-10-CM

## 2019-08-30 DIAGNOSIS — R64 Cachexia: Secondary | ICD-10-CM

## 2019-08-30 LAB — COMPREHENSIVE METABOLIC PANEL
ALT: 38 U/L (ref 0–44)
AST: 40 U/L (ref 15–41)
Albumin: 2.5 g/dL — ABNORMAL LOW (ref 3.5–5.0)
Alkaline Phosphatase: 77 U/L (ref 38–126)
Anion gap: 7 (ref 5–15)
BUN: 19 mg/dL (ref 6–20)
CO2: 20 mmol/L — ABNORMAL LOW (ref 22–32)
Calcium: 8.6 mg/dL — ABNORMAL LOW (ref 8.9–10.3)
Chloride: 105 mmol/L (ref 98–111)
Creatinine, Ser: 0.84 mg/dL (ref 0.61–1.24)
GFR calc Af Amer: >60 mL/min (ref >60)
GFR calc non Af Amer: >60 mL/min (ref >60)
Glucose, Bld: 108 mg/dL — ABNORMAL HIGH (ref 70–99)
Potassium: 4.4 mmol/L (ref 3.5–5.1)
Sodium: 132 mmol/L — ABNORMAL LOW (ref 135–145)
Total Bilirubin: 0.3 mg/dL (ref 0.3–1.2)
Total Protein: 7 g/dL (ref 6.5–8.1)

## 2019-08-30 LAB — GLUCOSE, CAPILLARY
Glucose-Capillary: 116 mg/dL — ABNORMAL HIGH (ref 70–99)
Glucose-Capillary: 77 mg/dL (ref 70–99)
Glucose-Capillary: 77 mg/dL (ref 70–99)
Glucose-Capillary: 94 mg/dL (ref 70–99)

## 2019-08-30 LAB — CBC WITH DIFFERENTIAL/PLATELET
Abs Immature Granulocytes: 0.23 10*3/uL — ABNORMAL HIGH (ref 0.00–0.07)
Basophils Absolute: 0 10*3/uL (ref 0.0–0.1)
Basophils Relative: 1 %
Eosinophils Absolute: 0 10*3/uL (ref 0.0–0.5)
Eosinophils Relative: 1 %
HCT: 31.5 % — ABNORMAL LOW (ref 39.0–52.0)
Hemoglobin: 10.6 g/dL — ABNORMAL LOW (ref 13.0–17.0)
Immature Granulocytes: 11 %
Lymphocytes Relative: 21 %
Lymphs Abs: 0.4 10*3/uL — ABNORMAL LOW (ref 0.7–4.0)
MCH: 30.6 pg (ref 26.0–34.0)
MCHC: 33.7 g/dL (ref 30.0–36.0)
MCV: 91 fL (ref 80.0–100.0)
Monocytes Absolute: 0.2 10*3/uL (ref 0.1–1.0)
Monocytes Relative: 10 %
Neutro Abs: 1.2 10*3/uL — ABNORMAL LOW (ref 1.7–7.7)
Neutrophils Relative %: 56 %
Platelets: 253 10*3/uL (ref 150–400)
RBC: 3.46 MIL/uL — ABNORMAL LOW (ref 4.22–5.81)
RDW: 15.3 % (ref 11.5–15.5)
WBC: 2.1 10*3/uL — ABNORMAL LOW (ref 4.0–10.5)
nRBC: 0.9 % — ABNORMAL HIGH (ref 0.0–0.2)

## 2019-08-30 LAB — MAGNESIUM: Magnesium: 1.8 mg/dL (ref 1.7–2.4)

## 2019-08-30 NOTE — Progress Notes (Signed)
Palliative: Chart reviewed for declines. Goals are set. Continues to consider PEG placement.  PMT to shadow for declines.  Plan:Continue full scope/full code. Awaiting patient/family choice for discharge. Anticipate home with [facility] under mother's care.   No charge.  Quinn Axe, NP Palliative Medicine Team Team Phone # [phone] Greater than 50% of this time was spent counseling and coordinating care related to the above assessment and plan

## 2019-08-30 NOTE — Progress Notes (Signed)
Occupational Therapy Treatment Patient Details Name: Arthur [name] MRN: [id number] DOB: 03-18-1981 Today's Date: 08/30/2019    History of present illness Pt is a 38 y.o. male admitted 07/28/19 with c/o worsening dizziness, headaches; has not taken HIV medications for ~1 yr due to afforability issues. Brain MRI consistent with opportunistic infection throughout both cerebral hemispheres. Pt also with lost R eye vision. Lumbar puncture 11/3. 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 Transfer to ICU 11/15 with worsening mental and respiratory status. PMH includes untreated HIV, depression, substance abuse.   OT comments  This 38 yo male admitted with above seen today to work on LUE movement and strengthening. He remains weak in LUE with really needing to focus on the activties I was having him do. 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 We will continue to follow.  Follow Up Recommendations  CIR;Supervision/Assistance - 24 hour    Equipment Recommendations  3 in 1 bedside commode       Precautions / Restrictions Precautions Precautions: Fall Precaution Comments: Cortrak, soft BP Restrictions Weight Bearing Restrictions: No                     Vision   Additional Comments: Pt reports that when right eye is covered he sees fine with left eye, but when left eye is covered he does not see anything (shapes, shadows, light, dark). Both eyes track together but at times it is noticed that right eye drifts right at rest          Cognition Arousal/Alertness: Awake/alert Behavior During Therapy: [facility] for tasks assessed/performed Overall Cognitive Status: Impaired/Different from baseline Area of Impairment: Following commands                       Following Commands: Follows one step commands with increased time                Exercises Other Exercises Other  Exercises: Has patient work on LUE with A/AAROM, SROM using LUE (for shoulder flexion), opening and closing containers--sometimes using LUE as stablizer and sometimes as active in the opening of them           Pertinent Vitals/ Pain       Pain Assessment: No/denies pain(with ROM on LUE)         Frequency  Min 2X/week        Progress Toward Goals  OT Goals(current goals can now be found in the care plan section)  Progress towards OT goals: Progressing toward goals  ADL Goals Pt/caregiver will Perform Home Exercise Program: (P) Increased ROM;Increased strength;Left upper extremity;With minimal assist;With written HEP provided(A/AROM, activities)  Plan Discharge plan remains appropriate       AM-PAC OT "6 Clicks" Daily Activity     Outcome Measure   Help from another person eating meals?: Total(NPO) Help from another person taking care of personal grooming?: A Lot Help from another person toileting, which includes using toliet, bedpan, or urinal?: Total Help from another person bathing (including washing, rinsing, drying)?: A Lot Help from another person to put on and taking off regular upper body clothing?: A Lot Help from another person to put on and taking off regular lower body clothing?: Total 6 Click Score: 9    End of Session    OT Visit Diagnosis: Muscle weakness (generalized) (M62.81);Other (comment)(blind right eye)   Activity Tolerance  Patient tolerated treatment well   Patient Left in bed;with call bell/phone within reach;with bed alarm set   Nurse Communication (feeding tube feeds alarming--she came in and fixed it)        Time: 1421-1440 OT Time Calculation (min): 19 min  Charges: OT General Charges $OT Visit: 1 Visit OT Treatments $Therapeutic Exercise: 8-22 mins  [place] [address], OTR/L Acute [employer] Pager [phone] Office [phone]     Almon Register 08/30/2019, 3:20 PM

## 2019-08-30 NOTE — Progress Notes (Signed)
[facility]    Date of Admission:  07/28/2019   Total days of antibiotics 29        Day 9 high dose Fluconazole        Day 15 Pyrimethamine + sulfadiazine         Bactrim ppx          Day 5 Tivicay + Descovy    ID: Arthur [name] is a 38 y.o. 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     Medications:  . dolutegravir  50 mg Oral Daily  . emtricitabine-tenofovir AF  1 tablet Oral Daily  . enoxaparin (LOVENOX) injection  40 mg Subcutaneous Q24H  . famotidine  20 mg Per Tube Daily  . feeding supplement (PRO-STAT SUGAR FREE 64)  30 mL Per Tube Daily  . free water  100 mL Per Tube Q8H  . insulin aspart  0-9 Units Subcutaneous Q4H  . mouth rinse  15 mL Mouth Rinse BID  . multivitamin  15 mL Per Tube Daily  . Pyrimethamine-Leucovorin  50 mg Per Tube Daily  . sulfaDIAZINE  1,000 mg Per Tube Q6H  . sulfamethoxazole-trimethoprim  20 mL Per Tube 3 times weekly  . terbinafine   Topical Daily    Objective: Vital signs in last 24 hours: Temp:  [97.1 F (36.2 C)-98.5 F (36.9 C)] 97.1 F (36.2 C) (12/04 1158) Pulse Rate:  [74-82] 81 (12/04 1158) Resp:  [18-24] 22 (12/04 1158) BP: (90-98)/(56-69) 90/69 (12/04 1158) SpO2:  [93 %-98 %] 93 % (12/04 1158) Weight:  [50.7 kg] 50.7 kg (12/04 0343)   Physical Exam   Constitutional: Sleeping in recliner difficult to wake and keep awake. Cachectic with facial wasting.  HENT: eyes remain closed  Mouth/Throat: unable to assess with lack of participation  Cardiovascular: Normal rate, regular rhythm and normal heart sounds. Exam reveals no gallop and no friction rub. No murmur heard.  Pulmonary/Chest: No audible rhonchi. Breathing effort normal. No respiratory distress. He has no wheezes.  Abdominal: Soft. scaphoid. No distension/tenderness Lymphadenopathy: He has no cervical adenopathy Skin: Skin is warm and dry. No rash noted. No erythema.    Lab Results Recent Labs    08/28/19 0205 08/30/19 0216  WBC 2.0* 2.1*  HGB 11.3* 10.6*  HCT 33.8* 31.5*  NA 138 132*  K 4.3 4.4  CL 109 105  CO2 22 20*  BUN 20 19  CREATININE 0.93 0.84    Microbiology: reviewed Studies/Results: Dg Chest Port 1 View  Result Date: 08/28/2019 CLINICAL DATA:  Dyspnea, cough, congestion EXAM: PORTABLE CHEST 1 VIEW COMPARISON:  November 27 20 FINDINGS: The heart size and mediastinal contours are within normal limits. Mildly increased patchy density at the right lung base. No pleural effusion or pneumothorax. The visualized skeletal structures are unremarkable. Enteric tube passes into the  stomach. IMPRESSION: Small patchy atelectasis/consolidation at the right base. Electronically Signed   By: Macy Mis M.D.   On: 08/28/2019 12:18     Assessment/Plan: CNS toxo = continue with current regimen of pyrimethamine and sulfaziadine, treatment day 15. He will require at least six weeks through 09/26/2019 prior to starting secondary prophylaxis until CD4 criteria met. He has been very slow to improve overall and uncertain as to what his long term deficits will be at this time.   Enteric Access = he has agreed to PEG and will be discharging home with his mother's care per chart review. In light of this upcoming procedure and realizing his immune function is very poor,  we feel this  is life saving and necessary to push forward with. He is back on his antiretrovirals and should illicit a quick reduction in viremia and allow him to heal following procedure.   R Vision Loss = reported on previous assessment that he can now see, although to what degree I could not ascertain.   Severe protein caloric malnutrition = on tube feedings via cortrak with transition to PEG after placement.   HIV Disease, +AIDS = Day 5 tivicay + descovy via NGT. Will reassess VL/CD4 at outpatient follow up. Continue Bactrim for ppx.   Thrush = improving with higher dose of Fluconazole. Would continue 400 mg daily through 12/09.   Will schedule a follow up with Dr. Tommy Medal in 3 weeks to ensure he is not lost to follow up. Please call back when nearing discharge, however so we may assist with dispensing his medications as they are difficult to arrange and assist with other discharge coordination.   Thank you   Janene Madeira, MSN, NP-C [facility] [email or web address] Pager: [phone] Office: [phone] [place]: [phone]

## 2019-08-30 NOTE — Progress Notes (Signed)
Physical Therapy Treatment Patient Details Name: Arthur [name] MRN: [id number] DOB: 1981/05/21 Today's Date: 08/30/2019    History of Present Illness Pt is a 38 y.o. male admitted 07/28/19 with c/o worsening dizziness, headaches; has not taken HIV medications for ~1 yr due to afforability issues. Brain MRI consistent with opportunistic infection throughout both cerebral hemispheres. Pt also with lost R eye vision. Lumbar puncture 11/3. 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 Transfer to ICU 11/15 with worsening mental and respiratory status. PMH includes untreated HIV, depression, substance abuse.    PT Comments    Patient seen for mobility progression. Pt is making progress toward PT goals and remains more alert and participatory throughout session. Continue to recommend CIR for further skilled PT services to maximize independence and safety with mobility.    Follow Up Recommendations  CIR;Supervision/Assistance - 24 hour(pt/family declined SNF)     Equipment Recommendations  (TBD)    Recommendations for Other Services       Precautions / Restrictions Precautions Precautions: Fall;Other (comment) Precaution Comments: Cortrak, soft BP Restrictions Weight Bearing Restrictions: No    Mobility  Bed Mobility Overal bed mobility: Needs Assistance Bed Mobility: Supine to Sit     Supine to sit: Max assist;Mod assist     General bed mobility comments: pt able to bring bilat LE to EOB; assistance required to elevate trunk into sitting  Transfers Overall transfer level: Needs assistance Equipment used: Ambulation equipment used Transfers: Sit to/from Stand Sit to Stand: Min assist;+2 physical assistance;+2 safety/equipment         General transfer comment: pt stood X 3 trials with +2 assist for power up and for balance once in standing due to L lateral bias; pt able to use R UE to bring L UE to [employer] bar with vc  Ambulation/Gait                 Stairs             Wheelchair Mobility    Modified Rankin (Stroke Patients Only)       Balance Overall balance assessment: Needs assistance Sitting-balance support: Feet supported;Bilateral upper extremity supported Sitting balance-Leahy Scale: Poor Sitting balance - Comments: assistance required for trunk support Postural control: Posterior lean;Left lateral lean Standing balance support: Bilateral upper extremity supported;During functional activity Standing balance-Leahy Scale: Poor Standing balance comment: working on standing balance with multimodal cues for weight shifting to midline                            Cognition Arousal/Alertness: Awake/alert Behavior During Therapy: WFL for tasks assessed/performed;Flat affect Overall Cognitive Status: Difficult to assess                                 General Comments: communicating with head nods; follows single step commands      Exercises      General Comments        Pertinent Vitals/Pain Pain Assessment: Faces Faces Pain Scale: Hurts little more Pain Location: L wrist and forearm with weight bearing  Pain Descriptors / Indicators: Guarding;Sore Pain Intervention(s): Monitored during session;Repositioned;Limited activity within patient's tolerance    Home Living                      Prior Function  PT Goals (current goals can now be found in the care plan section) Progress towards PT goals: Progressing toward goals    Frequency    Min 3X/week      PT Plan Current plan remains appropriate    Co-evaluation              AM-PAC PT "6 Clicks" Mobility   Outcome Measure  Help needed turning from your back to your side while in a flat bed without using bedrails?: A Lot Help needed moving from lying on your back to sitting on the side of a flat bed without using bedrails?: A Lot Help needed moving to and from a bed to a chair  (including a wheelchair)?: A Lot Help needed standing up from a chair using your arms (e.g., wheelchair or bedside chair)?: A Lot Help needed to walk in hospital room?: Total Help needed climbing 3-5 steps with a railing? : Total 6 Click Score: 10    End of Session Equipment Utilized During Treatment: Gait belt Activity Tolerance: Patient tolerated treatment well Patient left: with call bell/phone within reach;in chair;with chair alarm set Nurse Communication: Mobility status;Need for lift equipment PT Visit Diagnosis: Other abnormalities of gait and mobility (R26.89);Muscle weakness (generalized) (M62.81)     Time: 7017-7939 PT Time Calculation (min) (ACUTE ONLY): 33 min  Charges:  $Gait Training: 8-22 mins $Neuromuscular Re-education: 8-22 mins                     Earney Navy, PTA Acute Rehabilitation Services Pager: [phone] Office: [phone]     Darliss Cheney 08/30/2019, 10:57 AM

## 2019-08-30 NOTE — Progress Notes (Signed)
Patient ID: RIDER ERMIS, male   DOB: 01/24/1981, 38 y.o.   MRN: [id number]  PROGRESS NOTE    Arthur [name]  TTS:[id number] DOB: 03/13/1981 DOA: 07/28/2019 PCP: Randall Hiss, MD   Brief Narrative:  38 year old male with history of HIV, substance abuse, depression and GERD presented on 07/28/2019 with dizziness, weakness and intermittent headache with noncompliance to his HIV medications for about 1 year due to affordability issue.  MRI of the brain in the ED showed widespread areas of abnormal low level restricted diffusion and postcontrast enhancement throughout both cerebral hemispheres, predominantly infratentorial with the dominant abnormality in the left anterior frontal white matter consistent with an opportunistic infection, such as toxoplasmosis, tuberculosis or fungal cerebritis/meningitis, or parasitic infection.  He was admitted; ID was consulted.  He then revealed that he had lost his vision in his right eye.  He was started on penicillin and ganciclovir for possible CMV retinitis versus synovitis.  Ophthalmology was consulted.  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  Assessment & Plan:   CNS toxoplasmosis AIDS/untreated HIV Oral thrush -ID following.  Currently on pyrimethamine-leucovorin, sulfadiazine.  Continue induction phase treatment for total of 6 weeks, then switch to chronic phase as per ID. -Patient has been started on antiretroviral therapy: Continue tivicay plus Descovy -Continue high-dose Diflucan for 7 days -Continue Bactrim for prophylaxis -CSF was positive for VDRL.  Patient has completed penicillin treatment -Positive for CMV: Initially treated with ganciclovir which has subsequently been discontinued with negative CNS PCR -TB Gold plus negative.  MTB probe negative.  RIPE treatment stopped  Acute toxic/metabolic encephalopathy -Multifactorial including CNS infection and SIADH -Repeat CT head without new process, evolving lesions  of cerebellum and left frontal lobe -MRI brain with multifocal signal abnormality and abnormal contrast-enhancement, most consistent with infectious cerebritis, consistent with cerebral toxoplasmosis, no discrete abscess -Monitor mental status.  Right eye blindness, optic retinitis/neuritis -Patient has been evaluated by ophthalmology.  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  Was treated with 3% saline and IV Lasix.  Nephrology consulted and subsequently signed off on 08/16/2019 -Resolved.  Sodium stable.  Acute kidney injury -Resolved.  Stable  Dysphagia Severe protein calorie malnutrition/AIDS wasting syndrome -Tube feeding managed by dietitian.  SLP following and currently recommending n.p.o. -Patient/mother still hesitant about PEG tube so far.  SLP following and is still recommending n.p.o. and PEG tube placement.  We will have to talk to patient and mother regarding regarding the same.  DVT prophylaxis: Lovenox Code Status: Full Family Communication: Spoke to mother on phone on 08/28/2019.  Called mother on 08/30/2019 on phone but she did not pick up disposition Plan: Depends on clinical outcome  Consultants: ID/palliative care/ophthalmology/critical care/nephrology  Procedures: LP  Antimicrobials:  Anti-infectives (From admission, onward)   Start     Dose/Rate Route Frequency Ordered Stop   08/26/19 1600  dolutegravir (TIVICAY) tablet 50 mg     50 mg Oral Daily 08/26/19 1426     08/26/19 1600  emtricitabine-tenofovir AF (DESCOVY) 200-25 MG per tablet 1 tablet     1 tablet Oral Daily 08/26/19 1426     08/23/19 0900  sulfamethoxazole-trimethoprim (BACTRIM) 200-40 MG/5ML suspension 20 mL  Status:  Discontinued     20 mL Oral 3 times weekly 08/21/19 0950 08/21/19 1311   08/23/19 0900  sulfamethoxazole-trimethoprim (BACTRIM) 200-40  MG/5ML suspension 20 mL  20 mL Per Tube 3 times weekly 08/21/19 1311     08/21/19 2000  Pyrimethamine-Leucovorin 50-25 MG CAPS 50 mg     50 mg Per Tube Daily 08/21/19 1312     08/21/19 1100  fluconazole (DIFLUCAN) IVPB 400 mg     400 mg 100 mL/hr over 120 Minutes Intravenous Every 24 hours 08/21/19 1009     08/20/19 1200  sulfaDIAZINE tablet 1,000 mg     1,000 mg Per Tube Every 6 hours 08/20/19 1118     08/19/19 1015  sulfamethoxazole-trimethoprim (BACTRIM DS) 800-160 MG per tablet 1 tablet  Status:  Discontinued     1 tablet Oral 3 times weekly 08/19/19 1002 08/21/19 0950   08/16/19 2000  Pyrimethamine-Leucovorin 50-25 MG CAPS 50 mg  Status:  Discontinued     50 mg Oral Daily 08/15/19 1337 08/15/19 1357   08/16/19 2000  Pyrimethamine-Leucovorin 50-25 MG CAPS 50 mg  Status:  Discontinued     50 mg Oral Daily 08/15/19 1357 08/21/19 1312   08/15/19 2000  Pyrimethamine-Leucovorin 50-25 MG CAPS 200 mg  Status:  Discontinued     200 mg Oral  Once 08/15/19 1337 08/15/19 1357   08/15/19 2000  Pyrimethamine-Leucovorin 50-25 MG CAPS 200 mg     200 mg Oral  Once 08/15/19 1357 08/15/19 2114   08/15/19 1800  sulfaDIAZINE tablet 1,000 mg  Status:  Discontinued     1,000 mg Oral Every 6 hours 08/15/19 1337 08/20/19 1118   08/14/19 1000  demeclocycline (DECLOMYCIN) tablet 150 mg  Status:  Discontinued     150 mg Oral Every 12 hours 08/14/19 0848 08/14/19 0907   08/14/19 1000  demeclocycline (DECLOMYCIN) tablet 150 mg  Status:  Discontinued     150 mg Per Tube Every 12 hours 08/14/19 0907 08/19/19 1030   08/14/19 1000  fluconazole (DIFLUCAN) tablet 200 mg     200 mg Per Tube Daily 08/14/19 0907 08/18/19 1055   08/13/19 1015  fluconazole (DIFLUCAN) tablet 200 mg  Status:  Discontinued     200 mg Oral Daily 08/13/19 1009 08/14/19 0907   08/13/19 1000  penicillin g benzathine (BICILLIN LA) 1200000 UNIT/2ML injection 2.4 Million Units     2.4 Million Units Intramuscular  Once 08/08/19 1019 08/13/19 1222    08/12/19 1400  sulfamethoxazole-trimethoprim (BACTRIM) 272.48 mg in dextrose 5 % 500 mL IVPB  Status:  Discontinued     10 mg/kg/day  54.5 kg 344.7 mL/hr over 90 Minutes Intravenous Every 12 hours 08/12/19 0854 08/15/19 1337   08/11/19 1400  sulfamethoxazole-trimethoprim (BACTRIM) 400-80 MG per tablet 3 tablet  Status:  Discontinued     3 tablet Oral Every 8 hours 08/11/19 1349 08/12/19 0854   08/10/19 1000  abacavir-dolutegravir-lamiVUDine (TRIUMEQ) 600-50-300 MG per tablet 1 tablet  Status:  Discontinued     1 tablet Oral Daily 08/09/19 1514 08/12/19 0848   08/10/19 1000  sulfamethoxazole-trimethoprim (BACTRIM) 160 mg in dextrose 5 % 250 mL IVPB  Status:  Discontinued     160 mg 260 mL/hr over 60 Minutes Intravenous Daily 08/09/19 1514 08/11/19 1324   08/09/19 1730  metroNIDAZOLE (FLAGYL) IVPB 500 mg     500 mg 100 mL/hr over 60 Minutes Intravenous Every 8 hours 08/09/19 1636 08/13/19 1848   08/08/19 1000  rifampin (RIFADIN) 60 mg/mL oral suspension 600 mg  Status:  Discontinued     600 mg Per Tube Daily 08/07/19 1106 08/07/19 1319   08/08/19 1000  bictegravir-emtricitabine-tenofovir AF (BIKTARVY) 50-200-25 MG per tablet  1 tablet  Status:  Discontinued     1 tablet Oral Daily 08/07/19 1419 08/09/19 1514   08/07/19 1330  bictegravir-emtricitabine-tenofovir AF (BIKTARVY) 50-200-25 MG per tablet 1 tablet  Status:  Discontinued     1 tablet Oral Daily 08/07/19 1319 08/07/19 1419   08/07/19 1000  ethambutol (MYAMBUTOL) tablet 1,200 mg  Status:  Discontinued     1,200 mg Per Tube Daily 08/06/19 1423 08/07/19 1319   08/07/19 1000  isoniazid (NYDRAZID) tablet 300 mg  Status:  Discontinued     300 mg Per Tube Daily 08/06/19 1423 08/07/19 1319   08/07/19 1000  rifampin (RIFADIN) 60 mg/mL oral suspension 600 mg  Status:  Discontinued     600 mg Oral Daily 08/06/19 1423 08/07/19 1106   08/07/19 1000  sulfamethoxazole-trimethoprim (BACTRIM DS) 800-160 MG per tablet 1 tablet  Status:  Discontinued      1 tablet Per Tube Daily 08/06/19 1423 08/09/19 1514   08/07/19 1000  pyrazinamide tablet 1,500 mg  Status:  Discontinued     1,500 mg Per Tube Daily 08/06/19 1423 08/07/19 1319   08/03/19 1600  fluconazole (DIFLUCAN) IVPB 200 mg  Status:  Discontinued     200 mg 100 mL/hr over 60 Minutes Intravenous Every 24 hours 08/03/19 1548 08/13/19 1009   07/30/19 1700  rifampin (RIFADIN) capsule 600 mg  Status:  Discontinued     600 mg Oral Daily 07/30/19 1604 08/06/19 1423   07/30/19 1700  isoniazid (NYDRAZID) tablet 300 mg  Status:  Discontinued     300 mg Oral Daily 07/30/19 1604 08/06/19 1423   07/30/19 1700  pyrazinamide tablet 1,500 mg  Status:  Discontinued     1,500 mg Oral Daily 07/30/19 1604 08/06/19 1423   07/30/19 1700  ethambutol (MYAMBUTOL) tablet 1,200 mg  Status:  Discontinued     1,200 mg Oral Daily 07/30/19 1604 08/06/19 1423   07/29/19 1700  sulfamethoxazole-trimethoprim (BACTRIM DS) 800-160 MG per tablet 1 tablet  Status:  Discontinued     1 tablet Oral Daily 07/29/19 1601 08/06/19 1423   07/29/19 1700  ganciclovir (CYTOVENE) 285 mg in sodium chloride 0.9 % 100 mL IVPB  Status:  Discontinued     5 mg/kg  57 kg 100 mL/hr over 60 Minutes Intravenous Every 12 hours 07/29/19 1603 08/07/19 1526   07/29/19 1630  penicillin G potassium 12 Million Units in dextrose 5 % 250 mL IVPB  Status:  Discontinued     12 Million Units 250 mL/hr over 60 Minutes Intravenous Every 12 hours 07/29/19 1552 07/29/19 1559   07/29/19 1630  penicillin G potassium 12 Million Units in dextrose 5 % 500 mL continuous infusion     12 Million Units 41.7 mL/hr over 12 Hours Intravenous Every 12 hours 07/29/19 1559 08/12/19 2359       Subjective: Patient seen and examined at bedside.  He is awake but hardly engages in any conversation.  Very poor historian.  No overnight fever, vomiting, worsening shortness of breath reported. Objective: Vitals:   08/29/19 1540 08/29/19 2053 08/29/19 2305 08/30/19 0343   BP: 92/65 91/67 98/61  (!) 94/56  Pulse: 75 78 74 82  Resp: (!) 22 (!) 24 20 18   Temp: 98.2 F (36.8 C) 98.5 F (36.9 C) 97.8 F (36.6 C) 97.8 F (36.6 C)  TempSrc: Axillary Oral Oral Oral  SpO2: 96% 98% 97% 98%  Weight:    50.7 kg  Height:        Intake/Output Summary (Last 24  hours) at 08/30/2019 0728 Last data filed at 08/30/2019 0400 Gross per 24 hour  Intake 1630 ml  Output 1025 ml  Net 605 ml   Filed Weights   08/28/19 0447 08/29/19 0336 08/30/19 0343  Weight: 51 kg 50.4 kg 50.7 kg    Examination:  General exam: No acute distress.  Awake, hardly answers any questions.  Does not follow command.  Cortrak in place Respiratory system: Bilateral decreased breath sounds at bases with scattered crackles.  Intermittently tachypneic.   Cardiovascular system: S1-S2 heard, rate controlled Gastrointestinal system: Abdomen is nondistended, soft and nontender. Normal bowel sounds heard. Extremities: No cyanosis, edema   Data Reviewed: I have personally reviewed following labs and imaging studies  CBC: Recent Labs  Lab 08/25/19 0225 08/27/19 0248 08/28/19 0205 08/30/19 0216  WBC 2.0* 1.8* 2.0* 2.1*  NEUTROABS  --  0.8* 1.0* 1.2*  HGB 11.3* 10.9* 11.3* 10.6*  HCT 35.8* 34.3* 33.8* 31.5*  MCV 96.0 96.3 92.6 91.0  PLT 235 199 240 253   Basic Metabolic Panel: Recent Labs  Lab 08/24/19 0222 08/25/19 0225 08/27/19 0248 08/28/19 0205 08/30/19 0216  NA 145 143 139 138 132*  K 4.3 4.7 4.7 4.3 4.4  CL 116* 116* 111 109 105  CO2 24 21* 19* 22 20*  GLUCOSE 120* 113* 103* 100* 108*  BUN 31* 26* 22* 20 19  CREATININE 0.95 0.84 0.80 0.93 0.84  CALCIUM 9.0 8.9 8.6* 8.8* 8.6*  MG  --   --  1.9 2.0 1.8  PHOS  --   --  3.4 3.2  --    GFR: Estimated Creatinine Clearance: 87.2 mL/min (by C-G formula based on SCr of 0.84 mg/dL). Liver Function Tests: Recent Labs  Lab 08/27/19 0248 08/28/19 0205 08/30/19 0216  AST  --   --  40  ALT  --   --  38  ALKPHOS  --   --  77   BILITOT  --   --  0.3  PROT  --   --  7.0  ALBUMIN 2.6* 2.5* 2.5*   No results for input(s): LIPASE, AMYLASE in the last 168 hours. No results for input(s): AMMONIA in the last 168 hours. Coagulation Profile: No results for input(s): INR, PROTIME in the last 168 hours. Cardiac Enzymes: No results for input(s): CKTOTAL, CKMB, CKMBINDEX, TROPONINI in the last 168 hours. BNP (last 3 results) No results for input(s): PROBNP in the last 8760 hours. HbA1C: No results for input(s): HGBA1C in the last 72 hours. CBG: Recent Labs  Lab 08/29/19 0846 08/29/19 1116 08/29/19 1538 08/29/19 1935 08/29/19 2302  GLUCAP 81 110* 88 122* 103*   Lipid Profile: No results for input(s): CHOL, HDL, LDLCALC, TRIG, CHOLHDL, LDLDIRECT in the last 72 hours. Thyroid Function Tests: No results for input(s): TSH, T4TOTAL, FREET4, T3FREE, THYROIDAB in the last 72 hours. Anemia Panel: No results for input(s): VITAMINB12, FOLATE, FERRITIN, TIBC, IRON, RETICCTPCT in the last 72 hours. Sepsis Labs: No results for input(s): PROCALCITON, LATICACIDVEN in the last 168 hours.  No results found for this or any previous visit (from the past 240 hour(s)).       Radiology Studies: Dg Chest Port 1 View  Result Date: 08/28/2019 CLINICAL DATA:  Dyspnea, cough, congestion EXAM: PORTABLE CHEST 1 VIEW COMPARISON:  November 27 20 FINDINGS: The heart size and mediastinal contours are within normal limits. Mildly increased patchy density at the right lung base. No pleural effusion or pneumothorax. The visualized skeletal structures are unremarkable. Enteric tube passes  into the stomach. IMPRESSION: Small patchy atelectasis/consolidation at the right base. Electronically Signed   By: Guadlupe Spanish M.D.   On: 08/28/2019 12:18        Scheduled Meds: . dolutegravir  50 mg Oral Daily  . emtricitabine-tenofovir AF  1 tablet Oral Daily  . enoxaparin (LOVENOX) injection  40 mg Subcutaneous Q24H  . famotidine  20 mg Per Tube  Daily  . feeding supplement (PRO-STAT SUGAR FREE 64)  30 mL Per Tube Daily  . free water  100 mL Per Tube Q8H  . insulin aspart  0-9 Units Subcutaneous Q4H  . mouth rinse  15 mL Mouth Rinse BID  . multivitamin  15 mL Per Tube Daily  . Pyrimethamine-Leucovorin  50 mg Per Tube Daily  . sulfaDIAZINE  1,000 mg Per Tube Q6H  . sulfamethoxazole-trimethoprim  20 mL Per Tube 3 times weekly  . terbinafine   Topical Daily   Continuous Infusions: . chlorproMAZINE (THORAZINE) IV 12.5 mg (08/09/19 1135)  . feeding supplement (OSMOLITE 1.5 CAL) 1,000 mL (08/29/19 2301)  . fluconazole (DIFLUCAN) IV 400 mg (08/29/19 1017)          Glade Lloyd, MD [facility] 08/30/2019, 7:28 AM

## 2019-08-31 ENCOUNTER — Inpatient Hospital Stay (HOSPITAL_COMMUNITY): Payer: Medicaid Other

## 2019-08-31 LAB — GLUCOSE, CAPILLARY
Glucose-Capillary: 106 mg/dL — ABNORMAL HIGH (ref 70–99)
Glucose-Capillary: 107 mg/dL — ABNORMAL HIGH (ref 70–99)
Glucose-Capillary: 108 mg/dL — ABNORMAL HIGH (ref 70–99)
Glucose-Capillary: 115 mg/dL — ABNORMAL HIGH (ref 70–99)
Glucose-Capillary: 91 mg/dL (ref 70–99)
Glucose-Capillary: 93 mg/dL (ref 70–99)
Glucose-Capillary: 97 mg/dL (ref 70–99)

## 2019-08-31 IMAGING — CT CT ABDOMEN W/O CM
2 of 4 series · 16 of 46 positions shown, 18 images · non-contrast
Comparison: None.

CLINICAL DATA: Evaluate anatomy for potential percutaneous
gastrostomy tube placement.

EXAM:
CT ABDOMEN WITHOUT CONTRAST
TECHNIQUE: Multidetector CT imaging of the abdomen was performed following the
standard protocol without IV contrast.

[Series 3: abd/ pelvis 5.0 i30f 2 · axial · 0.72mm/px · z∈[-436,-150]mm · 13 of 63 slices shown, 15 images]
[im 3/63  soft-tissue]
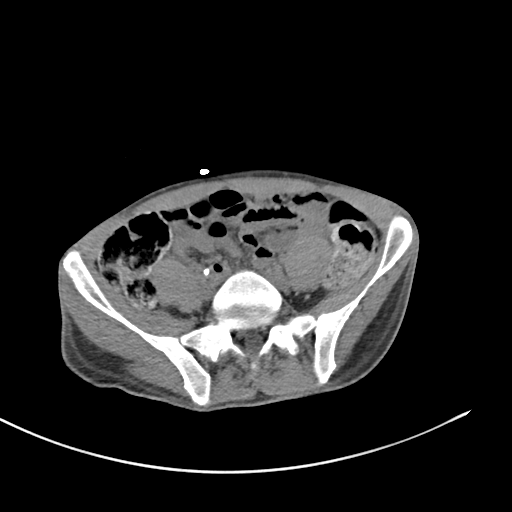
[im 3/63  bone]
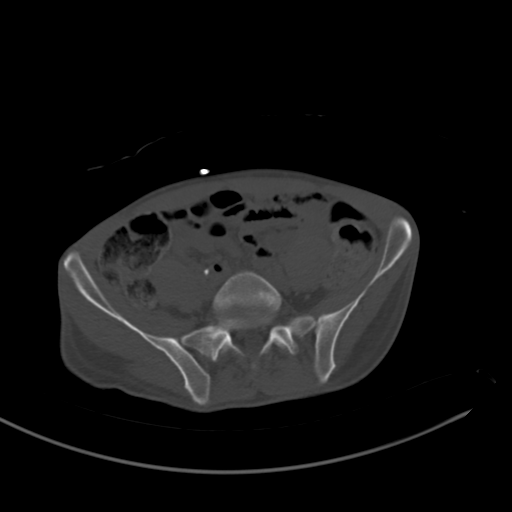
[im 9/63  soft-tissue]
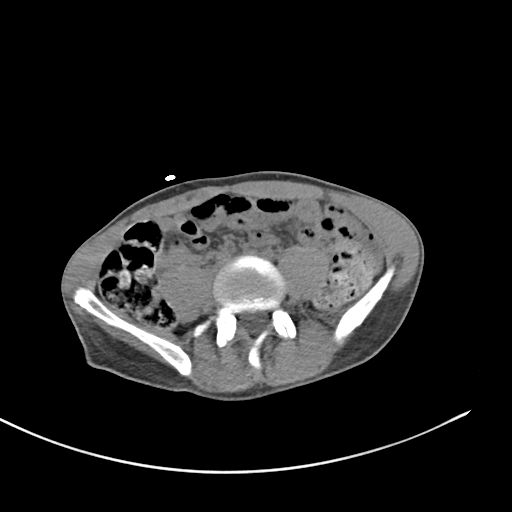
[im 15/63  soft-tissue]
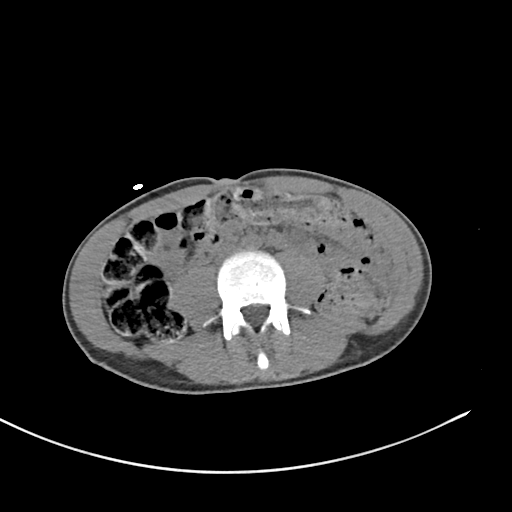
[im 17/63  soft-tissue]
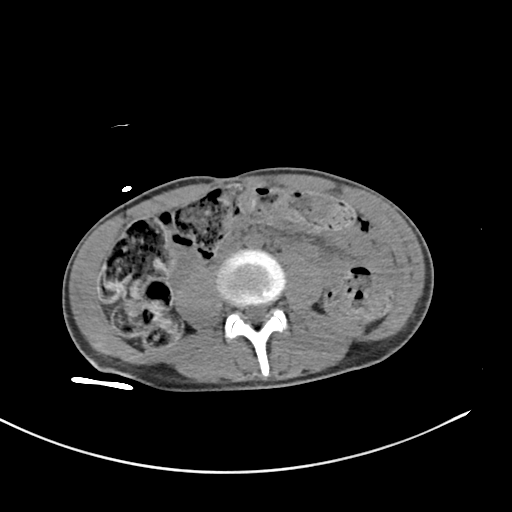
[im 23/63  soft-tissue]
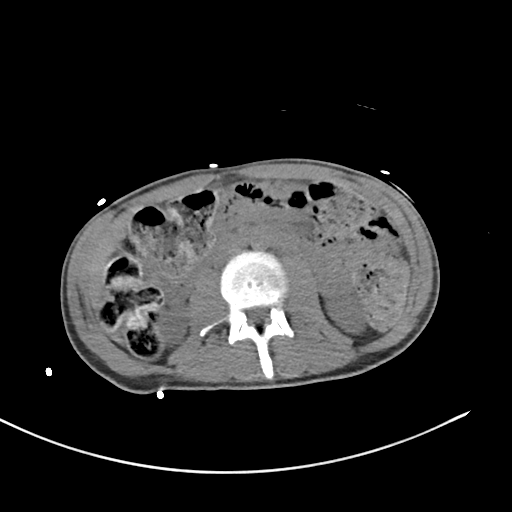
[im 26/63  soft-tissue]
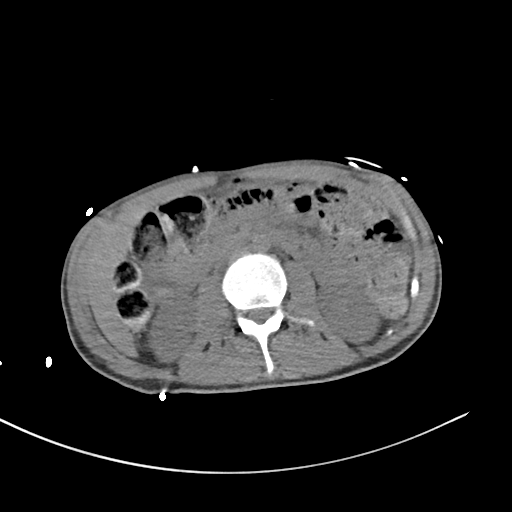
[im 32/63  soft-tissue]
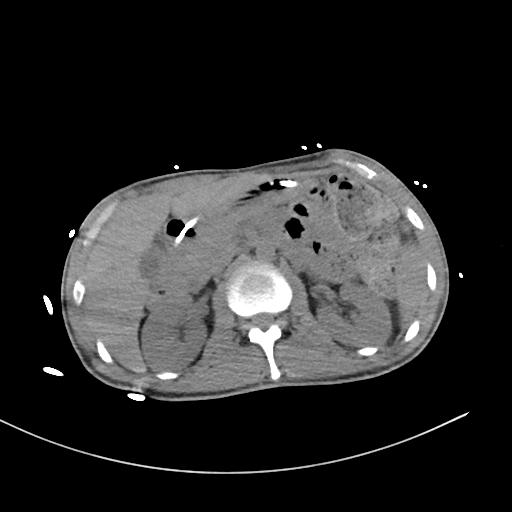
[im 37/63  soft-tissue]
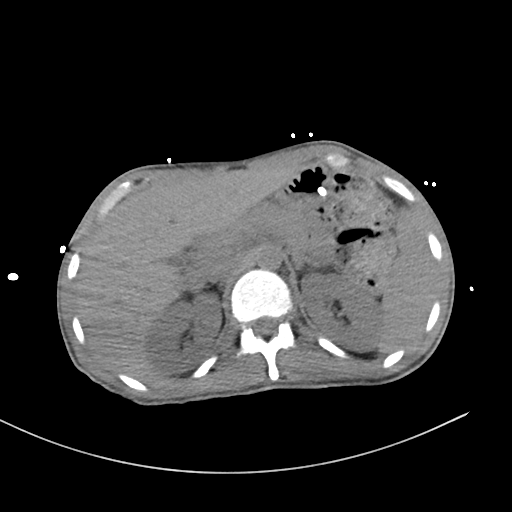
[im 40/63  soft-tissue]
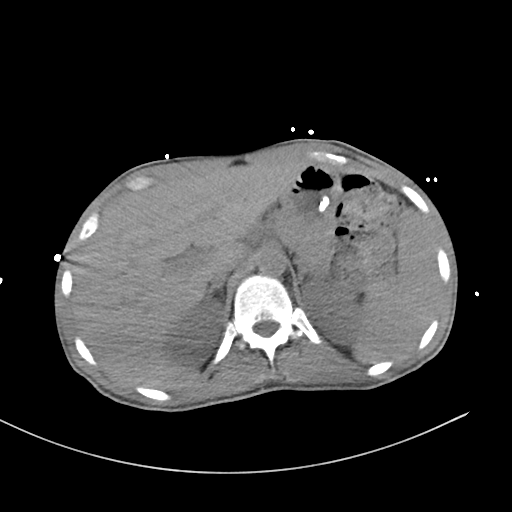
[im 40/63  bone]
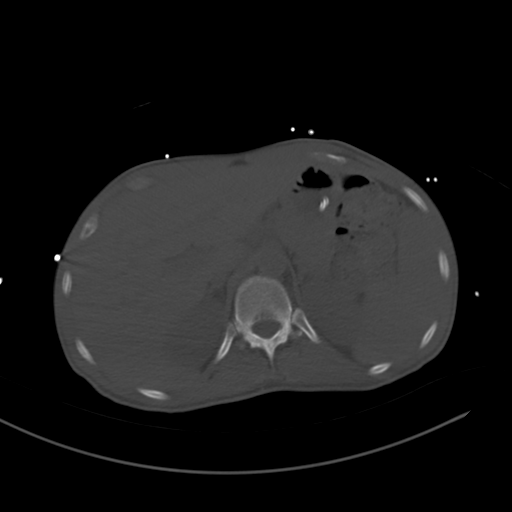
[im 46/63  soft-tissue]
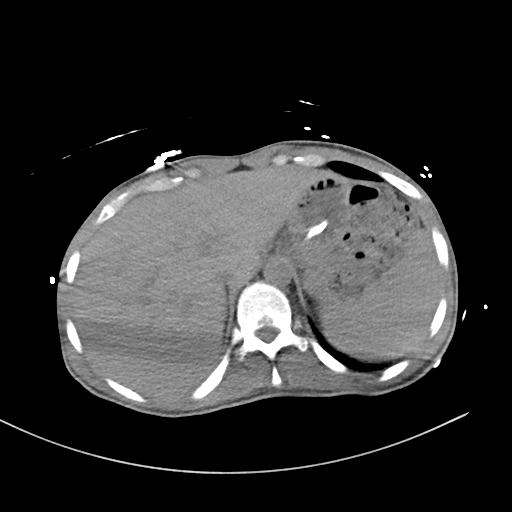
[im 48/63  soft-tissue]
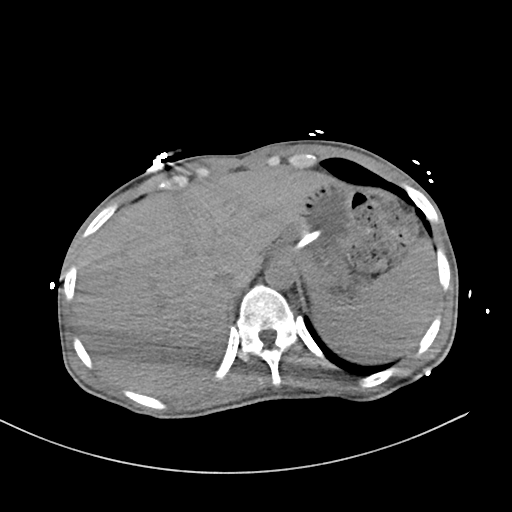
[im 54/63  soft-tissue]
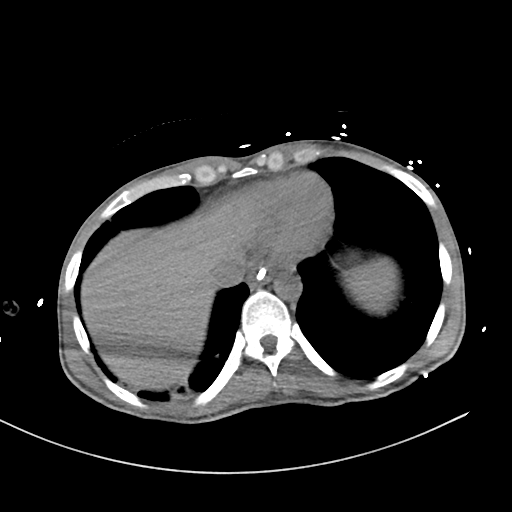
[im 60/63  soft-tissue]
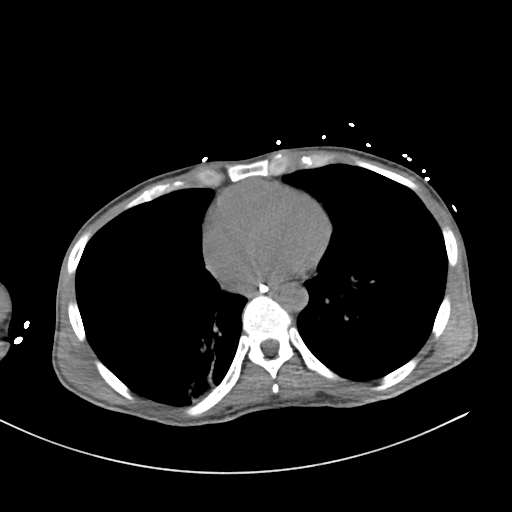

[Series 6: cor st · coronal · 0.59mm/px · 3 of 96 slices shown]
[im 32/96  soft-tissue]
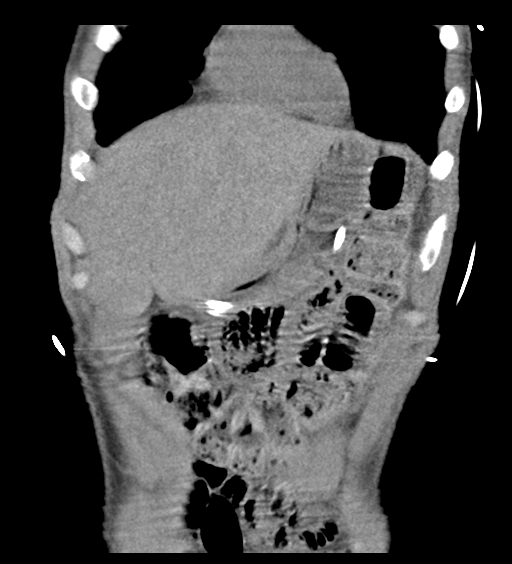
[im 43/96  soft-tissue]
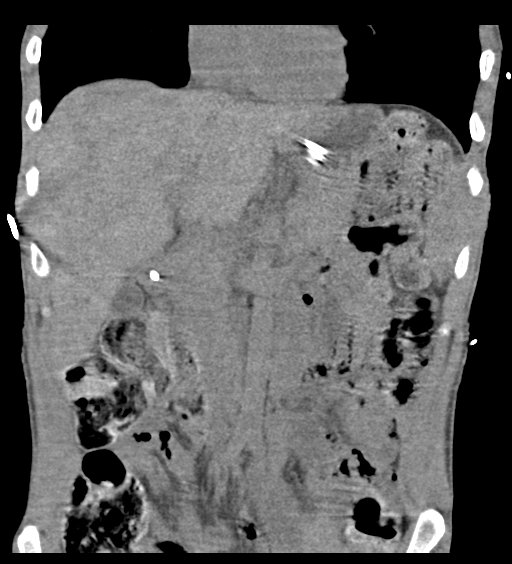
[im 53/96  soft-tissue]
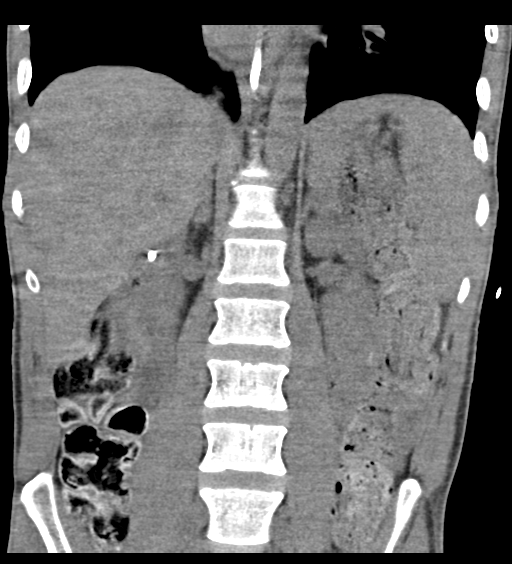

[16 of 46 positions shown; findings below may reference images not displayed]

FINDINGS: Lack of intravenous contrast limits the ability to evaluate solid
abdominal organs.

Lower chest: Limited visualization of the lower thorax demonstrates
consolidative opacities within the image right lower lobe with
associated air bronchograms (image 7, series 4). Ill-defined slight
tree-in-bud nodular opacities are seen within the contralateral left
lower lobe (image 8, series 4). No pleural effusion.

Normal heart size. No pericardial effusion. There is diffuse
decreased attenuation of the intra cardiac blood pool as could be
seen in the setting of anemia.

Hepatobiliary: Normal hepatic contour. The gallbladder is
underdistended. No ascites.

Pancreas: Suboptimally evaluated on this noncontrast examination.

Spleen: Normal noncontrast appearance of the spleen.

Adrenals/Urinary Tract: Normal noncontrast appearance of the
bilateral kidneys. No renal stones. No urine obstruction.

Normal noncontrast appearance the bilateral adrenal glands. Urinary
bladder was not imaged.

Stomach/Bowel: The anterior wall of the stomach appears well apposed
against the ventral abdominal wall without interposed hepatic
parenchyma or transverse colon, likely suspected improvement in
potential percutaneous access window with gastric insufflation.

Enteric tube tip terminates within the proximal duodenum. Large
colonic stool burden without evidence of enteric obstruction. No
pneumoperitoneum, pneumatosis or portal venous gas.

Vascular/Lymphatic: Normal caliber of the abdominal aorta.

No bulky retroperitoneal or mesenteric adenopathy on this
noncontrast examination.

Other: Regional soft tissues appear normal.

Musculoskeletal: No acute or aggressive osseous abnormalities.
IMPRESSION: 1. Gastric anatomy amenable to potential percutaneous gastrostomy
tube placement as indicated.
2. Consolidative opacities with associated air bronchograms within
the image right lower lobe with scattered ill-defined tree-in-bud
opacities within the imaged left, nonspecific though could be seen
in the setting infection and/or aspiration.

## 2019-08-31 NOTE — Progress Notes (Addendum)
Patient ID: Arthur [name], male   DOB: 02/11/1981, 38 y.o.   MRN: [id number]  PROGRESS NOTE    Arthur [name]  [id number] DOB: 07/23/1981 DOA: 07/28/2019 PCP: Randall HissVan Dam, Cornelius N, MD   Brief Narrative:  38 year old male with history of HIV, substance abuse, depression and GERD presented on 07/28/2019 with dizziness, weakness and intermittent headache with noncompliance to his HIV medications for about 1 year due to affordability issue.  MRI of the brain in the ED showed widespread areas of abnormal low level restricted diffusion and postcontrast enhancement throughout both cerebral hemispheres, predominantly infratentorial with the dominant abnormality in the left anterior frontal white matter consistent with an opportunistic infection, such as toxoplasmosis, tuberculosis or fungal cerebritis/meningitis, or parasitic infection.  He was admitted; ID was consulted.  He then revealed that he had lost his vision in his right eye.  He was started on penicillin and ganciclovir for possible CMV retinitis versus synovitis.  Ophthalmology was consulted.  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  Assessment & Plan:   CNS toxoplasmosis AIDS/untreated HIV Oral thrush -Currently on pyrimethamine-leucovorin, sulfadiazine.  Continue induction phase treatment for total of 6 weeks, then switch to chronic phase as per ID.  ID signed off on 08/30/2019 and recommend reconsulting ID as patient gets closer to discharge. -Patient has been started on antiretroviral therapy: Continue tivicay plus Descovy -Continue high-dose Diflucan for 7 days till 09/04/2019. -Continue Bactrim for prophylaxis -CSF was positive for VDRL.  Patient has completed penicillin treatment -Positive for CMV: Initially treated with ganciclovir which has subsequently been discontinued with negative CNS PCR -TB Gold plus negative.  MTB probe negative.  RIPE treatment stopped  Acute toxic/metabolic encephalopathy  -Multifactorial including CNS infection and SIADH -Repeat CT head without new process, evolving lesions of cerebellum and left frontal lobe -MRI brain with multifocal signal abnormality and abnormal contrast-enhancement, most consistent with infectious cerebritis, consistent with cerebral toxoplasmosis, no discrete abscess -Monitor mental status.  Right eye blindness, optic retinitis/neuritis -Patient has been evaluated by ophthalmology.  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  Was treated with 3% saline and IV Lasix.  Nephrology consulted and subsequently signed off on 08/16/2019 -Resolved.  Sodium stable.  Acute kidney injury -Resolved.  Stable  Dysphagia Severe protein calorie malnutrition/AIDS wasting syndrome -Tube feeding managed by dietitian.   -SLP following and is still recommending n.p.o. and PEG tube placement.  It looks like patient mother has agreed for PEG tube placement.  Will request IR for the same.  DVT prophylaxis: Lovenox Code Status: Full Family Communication: Spoke to mother on phone on 08/31/2019. disposition Plan: Depends on clinical outcome  Consultants: ID/palliative care/ophthalmology/critical care/nephrology  Procedures: LP  Antimicrobials:  Anti-infectives (From admission, onward)   Start     Dose/Rate Route Frequency Ordered Stop   08/26/19 1600  dolutegravir (TIVICAY) tablet 50 mg     50 mg Oral Daily 08/26/19 1426     08/26/19 1600  emtricitabine-tenofovir AF (DESCOVY) 200-25 MG per tablet 1 tablet     1 tablet Oral Daily 08/26/19 1426     08/23/19 0900  sulfamethoxazole-trimethoprim (BACTRIM) 200-40 MG/5ML suspension 20 mL  Status:  Discontinued     20 mL Oral 3 times weekly 08/21/19 0950 08/21/19 1311   08/23/19 0900  sulfamethoxazole-trimethoprim (BACTRIM) 200-40 MG/5ML suspension 20 mL     20 mL  Per Tube  3 times weekly 08/21/19 1311     08/21/19 2000  Pyrimethamine-Leucovorin 50-25 MG CAPS 50 mg     50 mg Per Tube Daily 08/21/19 1312     08/21/19 1100  fluconazole (DIFLUCAN) IVPB 400 mg     400 mg 100 mL/hr over 120 Minutes Intravenous Every 24 hours 08/21/19 1009     08/20/19 1200  sulfaDIAZINE tablet 1,000 mg     1,000 mg Per Tube Every 6 hours 08/20/19 1118     08/19/19 1015  sulfamethoxazole-trimethoprim (BACTRIM DS) 800-160 MG per tablet 1 tablet  Status:  Discontinued     1 tablet Oral 3 times weekly 08/19/19 1002 08/21/19 0950   08/16/19 2000  Pyrimethamine-Leucovorin 50-25 MG CAPS 50 mg  Status:  Discontinued     50 mg Oral Daily 08/15/19 1337 08/15/19 1357   08/16/19 2000  Pyrimethamine-Leucovorin 50-25 MG CAPS 50 mg  Status:  Discontinued     50 mg Oral Daily 08/15/19 1357 08/21/19 1312   08/15/19 2000  Pyrimethamine-Leucovorin 50-25 MG CAPS 200 mg  Status:  Discontinued     200 mg Oral  Once 08/15/19 1337 08/15/19 1357   08/15/19 2000  Pyrimethamine-Leucovorin 50-25 MG CAPS 200 mg     200 mg Oral  Once 08/15/19 1357 08/15/19 2114   08/15/19 1800  sulfaDIAZINE tablet 1,000 mg  Status:  Discontinued     1,000 mg Oral Every 6 hours 08/15/19 1337 08/20/19 1118   08/14/19 1000  demeclocycline (DECLOMYCIN) tablet 150 mg  Status:  Discontinued     150 mg Oral Every 12 hours 08/14/19 0848 08/14/19 0907   08/14/19 1000  demeclocycline (DECLOMYCIN) tablet 150 mg  Status:  Discontinued     150 mg Per Tube Every 12 hours 08/14/19 0907 08/19/19 1030   08/14/19 1000  fluconazole (DIFLUCAN) tablet 200 mg     200 mg Per Tube Daily 08/14/19 0907 08/18/19 1055   08/13/19 1015  fluconazole (DIFLUCAN) tablet 200 mg  Status:  Discontinued     200 mg Oral Daily 08/13/19 1009 08/14/19 0907   08/13/19 1000  penicillin g benzathine (BICILLIN LA) 1200000 UNIT/2ML injection 2.4 Million Units     2.4 Million Units Intramuscular  Once 08/08/19 1019 08/13/19 1222   08/12/19 1400   sulfamethoxazole-trimethoprim (BACTRIM) 272.48 mg in dextrose 5 % 500 mL IVPB  Status:  Discontinued     10 mg/kg/day  54.5 kg 344.7 mL/hr over 90 Minutes Intravenous Every 12 hours 08/12/19 0854 08/15/19 1337   08/11/19 1400  sulfamethoxazole-trimethoprim (BACTRIM) 400-80 MG per tablet 3 tablet  Status:  Discontinued     3 tablet Oral Every 8 hours 08/11/19 1349 08/12/19 0854   08/10/19 1000  abacavir-dolutegravir-lamiVUDine (TRIUMEQ) 600-50-300 MG per tablet 1 tablet  Status:  Discontinued     1 tablet Oral Daily 08/09/19 1514 08/12/19 0848   08/10/19 1000  sulfamethoxazole-trimethoprim (BACTRIM) 160 mg in dextrose 5 % 250 mL IVPB  Status:  Discontinued     160 mg 260 mL/hr over 60 Minutes Intravenous Daily 08/09/19 1514 08/11/19 1324   08/09/19 1730  metroNIDAZOLE (FLAGYL) IVPB 500 mg     500 mg 100 mL/hr over 60 Minutes Intravenous Every 8 hours 08/09/19 1636 08/13/19 1848   08/08/19 1000  rifampin (RIFADIN) 60 mg/mL oral suspension 600 mg  Status:  Discontinued     600 mg Per Tube Daily 08/07/19 1106 08/07/19 1319   08/08/19 1000  bictegravir-emtricitabine-tenofovir AF (BIKTARVY) 50-200-25 MG per tablet 1 tablet  Status:  Discontinued     1 tablet Oral Daily 08/07/19 1419 08/09/19 1514   08/07/19 1330  bictegravir-emtricitabine-tenofovir AF (BIKTARVY) 50-200-25 MG per tablet 1 tablet  Status:  Discontinued     1 tablet Oral Daily 08/07/19 1319 08/07/19 1419   08/07/19 1000  ethambutol (MYAMBUTOL) tablet 1,200 mg  Status:  Discontinued     1,200 mg Per Tube Daily 08/06/19 1423 08/07/19 1319   08/07/19 1000  isoniazid (NYDRAZID) tablet 300 mg  Status:  Discontinued     300 mg Per Tube Daily 08/06/19 1423 08/07/19 1319   08/07/19 1000  rifampin (RIFADIN) 60 mg/mL oral suspension 600 mg  Status:  Discontinued     600 mg Oral Daily 08/06/19 1423 08/07/19 1106   08/07/19 1000  sulfamethoxazole-trimethoprim (BACTRIM DS) 800-160 MG per tablet 1 tablet  Status:  Discontinued     1 tablet Per  Tube Daily 08/06/19 1423 08/09/19 1514   08/07/19 1000  pyrazinamide tablet 1,500 mg  Status:  Discontinued     1,500 mg Per Tube Daily 08/06/19 1423 08/07/19 1319   08/03/19 1600  fluconazole (DIFLUCAN) IVPB 200 mg  Status:  Discontinued     200 mg 100 mL/hr over 60 Minutes Intravenous Every 24 hours 08/03/19 1548 08/13/19 1009   07/30/19 1700  rifampin (RIFADIN) capsule 600 mg  Status:  Discontinued     600 mg Oral Daily 07/30/19 1604 08/06/19 1423   07/30/19 1700  isoniazid (NYDRAZID) tablet 300 mg  Status:  Discontinued     300 mg Oral Daily 07/30/19 1604 08/06/19 1423   07/30/19 1700  pyrazinamide tablet 1,500 mg  Status:  Discontinued     1,500 mg Oral Daily 07/30/19 1604 08/06/19 1423   07/30/19 1700  ethambutol (MYAMBUTOL) tablet 1,200 mg  Status:  Discontinued     1,200 mg Oral Daily 07/30/19 1604 08/06/19 1423   07/29/19 1700  sulfamethoxazole-trimethoprim (BACTRIM DS) 800-160 MG per tablet 1 tablet  Status:  Discontinued     1 tablet Oral Daily 07/29/19 1601 08/06/19 1423   07/29/19 1700  ganciclovir (CYTOVENE) 285 mg in sodium chloride 0.9 % 100 mL IVPB  Status:  Discontinued     5 mg/kg  57 kg 100 mL/hr over 60 Minutes Intravenous Every 12 hours 07/29/19 1603 08/07/19 1526   07/29/19 1630  penicillin G potassium 12 Million Units in dextrose 5 % 250 mL IVPB  Status:  Discontinued     12 Million Units 250 mL/hr over 60 Minutes Intravenous Every 12 hours 07/29/19 1552 07/29/19 1559   07/29/19 1630  penicillin G potassium 12 Million Units in dextrose 5 % 500 mL continuous infusion     12 Million Units 41.7 mL/hr over 12 Hours Intravenous Every 12 hours 07/29/19 1559 08/12/19 2359       Subjective: Patient seen and examined at bedside.  He is awake but hardly engages in any conversation.  Very withdrawn.  Follows some commands.  Very poor historian.  No fever or vomiting reported by nursing staff.   Objective: Vitals:   08/30/19 2000 08/30/19 2329 08/31/19 0337 08/31/19 0500   BP: 91/64 (!) 88/55 (!) 89/58   Pulse: 65 64 65   Resp: Temp: 97.6 F (36.4 C) (!) 97.2 F (36.2 C) (!) 97.3 F (36.3 C)   TempSrc: Oral Axillary Axillary   SpO2: 100% 100% 98%   Weight:    51.2 kg  Height:        Intake/Output Summary (Last  24 hours) at 08/31/2019 0810 Last data filed at 08/31/2019 0629 Gross per 24 hour  Intake 776.42 ml  Output 1302 ml  Net -525.58 ml   Filed Weights   08/29/19 0336 08/30/19 0343 08/31/19 0500  Weight: 50.4 kg 50.7 kg 51.2 kg    Examination:  General exam: No distress.  Awake, hardly answers any questions.  Follows some commands.  Cortrak in place Respiratory system: Bilateral decreased breath sounds at bases with some crackles.   Cardiovascular system: Rate controlled, S1-S2 heard Gastrointestinal system: Abdomen is nondistended, soft and nontender. Normal bowel sounds heard. Extremities: No cyanosis, edema   Data Reviewed: I have personally reviewed following labs and imaging studies  CBC: Recent Labs  Lab 08/25/19 0225 08/27/19 0248 08/28/19 0205 08/30/19 0216  WBC 2.0* 1.8* 2.0* 2.1*  NEUTROABS  --  0.8* 1.0* 1.2*  HGB 11.3* 10.9* 11.3* 10.6*  HCT 35.8* 34.3* 33.8* 31.5*  MCV 96.0 96.3 92.6 91.0  PLT 235 199 240 253   Basic Metabolic Panel: Recent Labs  Lab 08/25/19 0225 08/27/19 0248 08/28/19 0205 08/30/19 0216  NA 143 139 138 132*  K 4.7 4.7 4.3 4.4  CL 116* 111 109 105  CO2 21* 19* 22 20*  GLUCOSE 113* 103* 100* 108*  BUN 26* 22* 20 19  CREATININE 0.84 0.80 0.93 0.84  CALCIUM 8.9 8.6* 8.8* 8.6*  MG  --  1.9 2.0 1.8  PHOS  --  3.4 3.2  --    GFR: Estimated Creatinine Clearance: 88 mL/min (by C-G formula based on SCr of 0.84 mg/dL). Liver Function Tests: Recent Labs  Lab 08/27/19 0248 08/28/19 0205 08/30/19 0216  AST  --   --  40  ALT  --   --  38  ALKPHOS  --   --  77  BILITOT  --   --  0.3  PROT  --   --  7.0  ALBUMIN 2.6* 2.5* 2.5*   No results for input(s): LIPASE, AMYLASE in  the last 168 hours. No results for input(s): AMMONIA in the last 168 hours. Coagulation Profile: No results for input(s): INR, PROTIME in the last 168 hours. Cardiac Enzymes: No results for input(s): CKTOTAL, CKMB, CKMBINDEX, TROPONINI in the last 168 hours. BNP (last 3 results) No results for input(s): PROBNP in the last 8760 hours. HbA1C: No results for input(s): HGBA1C in the last 72 hours. CBG: Recent Labs  Lab 08/29/19 2302 08/30/19 0803 08/30/19 1159 08/30/19 1618 08/30/19 2331  GLUCAP 103* 77 77 94 116*   Lipid Profile: No results for input(s): CHOL, HDL, LDLCALC, TRIG, CHOLHDL, LDLDIRECT in the last 72 hours. Thyroid Function Tests: No results for input(s): TSH, T4TOTAL, FREET4, T3FREE, THYROIDAB in the last 72 hours. Anemia Panel: No results for input(s): VITAMINB12, FOLATE, FERRITIN, TIBC, IRON, RETICCTPCT in the last 72 hours. Sepsis Labs: No results for input(s): PROCALCITON, LATICACIDVEN in the last 168 hours.  No results found for this or any previous visit (from the past 240 hour(s)).       Radiology Studies: No results found.      Scheduled Meds: . dolutegravir  50 mg Oral Daily  . emtricitabine-tenofovir AF  1 tablet Oral Daily  . enoxaparin (LOVENOX) injection  40 mg Subcutaneous Q24H  . famotidine  20 mg Per Tube Daily  . feeding supplement (PRO-STAT SUGAR FREE 64)  30 mL Per Tube Daily  . free water  100 mL Per Tube Q8H  . insulin aspart  0-9 Units Subcutaneous Q4H  .  mouth rinse  15 mL Mouth Rinse BID  . multivitamin  15 mL Per Tube Daily  . Pyrimethamine-Leucovorin  50 mg Per Tube Daily  . sulfaDIAZINE  1,000 mg Per Tube Q6H  . sulfamethoxazole-trimethoprim  20 mL Per Tube 3 times weekly  . terbinafine   Topical Daily   Continuous Infusions: . chlorproMAZINE (THORAZINE) IV 12.5 mg (08/09/19 1135)  . feeding supplement (OSMOLITE 1.5 CAL) 1,000 mL (08/31/19 0629)  . fluconazole (DIFLUCAN) IV 400 mg (08/30/19 1123)           Aline August, MD [facility] 08/31/2019, 8:10 AM

## 2019-09-01 LAB — GLUCOSE, CAPILLARY
Glucose-Capillary: 117 mg/dL — ABNORMAL HIGH (ref 70–99)
Glucose-Capillary: 118 mg/dL — ABNORMAL HIGH (ref 70–99)
Glucose-Capillary: 99 mg/dL (ref 70–99)
Glucose-Capillary: 125 mg/dL — ABNORMAL HIGH (ref 70–99)
Glucose-Capillary: 120 mg/dL — ABNORMAL HIGH (ref 70–99)

## 2019-09-01 MED ORDER — LOPERAMIDE HCL 1 MG/7.5ML PO SUSP
2.0000 mg | ORAL | Status: DC | PRN
  Administered 2019-09-01: 2 mg
  Filled 2019-09-01 (×3): qty 15

## 2019-09-01 MED ORDER — CEFAZOLIN SODIUM-DEXTROSE 2-4 GM/100ML-% IV SOLN
2.0000 g | INTRAVENOUS | Status: AC
  Administered 2019-09-02: 14:00:00 2 g via INTRAVENOUS
  Filled 2019-09-01: qty 100

## 2019-09-01 MED ORDER — ENOXAPARIN SODIUM 40 MG/0.4ML ~~LOC~~ SOLN
40.0000 mg | SUBCUTANEOUS | Status: DC
  Administered 2019-09-03 – 2019-09-07 (×5): 40 mg via SUBCUTANEOUS
  Filled 2019-09-01 (×5): qty 0.4

## 2019-09-01 NOTE — Plan of Care (Signed)

## 2019-09-01 NOTE — Consult Note (Signed)
Chief Complaint: Patient was seen in consultation today for possible percutaneous gastrostomy tube placement Chief Complaint  Patient presents with   Dizziness   Weakness    Referring Physician(s): Alekh,K  Supervising Physician: Gilmer Mor   Patient Status: [facility] - In-pt  History of Present Illness: Arthur [name] is a 38 y.o. male with hx of advanced HIV and wasting syndrome, prior substance abuse, CNS syphilis/toxoplasmosis with associated optic neuritis, GERD, encephalopathy, severe protein calorie malnutrition and dysphagia currently with NG tube in place.  Request now received from primary team for percutaneous gastrostomy tube placement.  Patient was Covid- 19 negative on 07/28/19. Palliative care involved and anticipate home with [facility] under mother's care at discharge.    Past Medical History:  Diagnosis Date   Anemia    Anxiety    Depression    GERD (gastroesophageal reflux disease)    HIV infection (HCC)    Substance abuse (HCC)     Past Surgical History:  Procedure Laterality Date   TOOTH EXTRACTION      Allergies: Patient has no known allergies.  Medications: Prior to Admission medications   Medication Sig Start Date End Date Taking? Authorizing Provider  ondansetron (ZOFRAN ODT) 8 MG disintegrating tablet Take 1 tablet (8 mg total) by mouth every 8 (eight) hours as needed for nausea or vomiting. 07/26/19   Margarita Grizzle, MD  ritonavir (NORVIR) 100 MG TABS tablet Take 1 tablet (100 mg total) by mouth daily. Patient not taking: Reported on 01/22/2018 02/06/14   [name], [name], MD  TRUVADA 200-300 MG per tablet TAKE 1 TABLET BY MOUTH DAILY Patient not taking: Reported on 01/22/2018 05/12/14   [name], [name], MD     Family History  Problem Relation Age of Onset   Diabetes Neg Hx    CAD Neg Hx    Hypertension Neg Hx     Social History   Socioeconomic History   Marital status: Single    Spouse name: Not on file   Number of  children: Not on file   Years of education: Not on file   Highest education level: Not on file  Occupational History   Not on file  Social Needs   Financial resource strain: Not on file   Food insecurity    Worry: Not on file    Inability: Not on file   Transportation needs    Medical: Not on file    Non-medical: Not on file  Tobacco Use   Smoking status: Current Every Day Smoker    Packs/day: 0.40    Years: 17.00    Pack years: 6.80   Smokeless tobacco: Never Used  Substance and Sexual Activity   Alcohol use: No    Comment: occassionally   Drug use: Yes    Types: Marijuana   Sexual activity: Not Currently    Partners: Male    Comment: pt. declined condoms  Lifestyle   Physical activity    Days per week: Not on file    Minutes per session: Not on file   Stress: Not on file  Relationships   Social connections    Talks on phone: Not on file    Gets together: Not on file    Attends religious service: Not on file    Active member of club or organization: Not on file    Attends meetings of clubs or organizations: Not on file    Relationship status: Not on file  Other Topics Concern  Not on file  Social History Narrative   Not on file     Review of Systems difficult to elicit due to altered mental state; currently without fever, abd pain,N/V or bleeding; has had some dyspnea, cough  Vital Signs: BP (!) 84/55 (BP Location: Left Arm)    Pulse 67    Temp 98.6 F (37 C) (Axillary)    Resp 18    Ht  (1.803 m)    Wt 111 lb 1.8 oz (50.4 kg)    SpO2 96%    BMI 15.50 kg/m   Physical Exam pt awake, eyes open; will follow some very simple commands but not engaging in conversation; chest- scatt coarse BS bilat; heart- RRR; abd- soft,+BS,NT; no LE edema; NG in place  Imaging: Ct Abdomen Wo Contrast  Result Date: 08/31/2019 CLINICAL DATA:  Evaluate anatomy for potential percutaneous gastrostomy tube placement. EXAM: CT ABDOMEN WITHOUT CONTRAST TECHNIQUE:  Multidetector CT imaging of the abdomen was performed following the standard protocol without IV contrast. COMPARISON:  None. FINDINGS: Lack of intravenous contrast limits the ability to evaluate solid abdominal organs. Lower chest: Limited visualization of the lower thorax demonstrates consolidative opacities within the image right lower lobe with associated air bronchograms (image 7, series 4). Ill-defined slight tree-in-bud nodular opacities are seen within the contralateral left lower lobe (image 8, series 4). No pleural effusion. Normal heart size. No pericardial effusion. There is diffuse decreased attenuation of the intra cardiac blood pool as could be seen in the setting of anemia. Hepatobiliary: Normal hepatic contour. The gallbladder is underdistended. No ascites. Pancreas: Suboptimally evaluated on this noncontrast examination. Spleen: Normal noncontrast appearance of the spleen. Adrenals/Urinary Tract: Normal noncontrast appearance of the bilateral kidneys. No renal stones. No urine obstruction. Normal noncontrast appearance the bilateral adrenal glands. Urinary bladder was not imaged. Stomach/Bowel: The anterior wall of the stomach appears well apposed against the ventral abdominal wall without interposed hepatic parenchyma or transverse colon, likely suspected improvement in potential percutaneous access window with gastric insufflation. Enteric tube tip terminates within the proximal duodenum. Large colonic stool burden without evidence of enteric obstruction. No pneumoperitoneum, pneumatosis or portal venous gas. Vascular/Lymphatic: Normal caliber of the abdominal aorta. No bulky retroperitoneal or mesenteric adenopathy on this noncontrast examination. Other: Regional soft tissues appear normal. Musculoskeletal: No acute or aggressive osseous abnormalities. IMPRESSION: 1. Gastric anatomy amenable to potential percutaneous gastrostomy tube placement as indicated. 2. Consolidative opacities with  associated air bronchograms within the image right lower lobe with scattered ill-defined tree-in-bud opacities within the imaged left, nonspecific though could be seen in the setting infection and/or aspiration. Electronically Signed   By: Simonne Come M.D.   On: 08/31/2019 12:17   Dg Chest 1 View  Result Date: 08/03/2019 CLINICAL DATA:  SOB EXAM: CHEST  1 VIEW COMPARISON:  Chest radiograph 07/22/2019 FINDINGS: The heart size and mediastinal contours are within normal limits. The lungs are clear. No pneumothorax or pleural effusion. The visualized skeletal structures are unremarkable. IMPRESSION: No acute cardiopulmonary process. Electronically Signed   By: Emmaline Kluver M.D.   On: 08/03/2019 16:50   Dg Abd 1 View  Result Date: 08/14/2019 CLINICAL DATA:  38 year old male status post feeding tube placement. EXAM: ABDOMEN - 1 VIEW COMPARISON:  Radiograph dated 08/05/2019. FINDINGS: Feeding tube with tip in the distal second portion of the duodenum. Contrast injected through the tube opacifies the second and third portions of the duodenum. IMPRESSION: Feeding tube with tip in the second portion of the duodenum. Electronically Signed  By: Elgie Collard M.D.   On: 08/14/2019 09:27   Dg Abd 1 View  Result Date: 08/05/2019 CLINICAL DATA:  Dysphagia. NG tube placement. EXAM: ABDOMEN - 1 VIEW COMPARISON:  None. FLUOROSCOPY TIME:  Fluoroscopy Time: 2 minutes 42 seconds Radiation Exposure Index: 8.20 mGy FINDINGS: A single fluoroscopic image demonstrates the tip and side hole of an enteric tube in the left upper quadrant in the expected location of the gastric fundus. IMPRESSION: Enteric tube in the proximal stomach. Electronically Signed   By: Sebastian Ache M.D.   On: 08/05/2019 16:35   Ct Head Wo Contrast  Result Date: 08/17/2019 CLINICAL DATA:  Weakness.  Encephalopathy. EXAM: CT HEAD WITHOUT CONTRAST TECHNIQUE: Contiguous axial images were obtained from the base of the skull through the vertex  without intravenous contrast. COMPARISON:  Head CT 20 days ago.  Interval brain MRI FINDINGS: Brain: High-density areas within the bilateral cerebellum have resolved. In place are newly seen low-density areas with patchy distribution on both sides. The extent is similar to signal abnormality in the bilateral cerebellum on interval brain MRI. At the site of a large anterior subcortical left frontal lesion there is new high density which could be petechial hemorrhage or mineralization. Suspect there is decreased mass effect at this level. Age advanced brain atrophy in the setting of HIV. No hematoma, hydrocephalus, or collection. Vascular: Negative Skull: Negative Sinuses/Orbits: Negative IMPRESSION: 1. No new process. 2. Evolving lesions of the cerebellum and left frontal lobe, extent of disease is underestimated relative to prior MRI. A Left frontal lesion is now mildly high density which could be from mineralization or petechial hemorrhage. 3. Atrophy. Electronically Signed   By: Marnee Spring M.D.   On: 08/17/2019 12:14   Mr Laqueta Jean ZO Contrast  Result Date: 08/21/2019 CLINICAL DATA:  Dizziness, weakness and headache. History of HIV. EXAM: MRI HEAD WITHOUT AND WITH CONTRAST TECHNIQUE: Multiplanar, multiecho pulse sequences of the brain and surrounding structures were obtained without and with intravenous contrast. CONTRAST:  4.44mL GADAVIST GADOBUTROL 1 MMOL/ML IV SOLN COMPARISON:  None. FINDINGS: BRAIN: There is multifocal abnormal diffusion restriction within both cerebral hemispheres, greatest along the cortices of the medial frontal lobes. There is widespread hyperintense T2-weighted signal abnormality, greatest in the cerebellum. These findings are new compared to the prior MRI. There is abnormal contrast enhancement in the bilateral frontal and left temporal lobes. There is no extra-axial collection. No midline shift or other mass effect. No hydrocephalus. VASCULAR: The major intracranial arterial and  venous sinus flow voids are normal. Susceptibility-sensitive sequences show no chronic microhemorrhage or superficial siderosis. SKULL AND UPPER CERVICAL SPINE: Calvarial bone marrow signal is normal. There is no skull base mass. The visualized upper cervical spine and soft tissues are normal. SINUSES/ORBITS: There are no fluid levels or advanced mucosal thickening. The mastoid air cells and middle ear cavities are free of fluid. The orbits are normal. IMPRESSION: 1. Multifocal signal abnormality and abnormal contrast enhancement within both cerebral and cerebellar hemispheres. In this clinical scenario, the findings are most consistent with infectious cerebritis, and the pattern is compatible with cerebral toxoplasmosis. 2. No discrete abscess, though the largest area of contrast enhancement in the left frontal lobe may be in the early stages of abscess formation. 3. No hemorrhage or mass effect. Electronically Signed   By: Deatra Robinson M.D.   On: 08/21/2019 22:33   Mr Laqueta Jean XW Contrast  Result Date: 08/05/2019 CLINICAL DATA:  Optic neuritis. Possible intracranial infection. Compromised immune status. EXAM:  MRI HEAD AND ORBITS WITHOUT AND WITH CONTRAST TECHNIQUE: Multiplanar, multiecho pulse sequences of the brain and surrounding structures were obtained without and with intravenous contrast. Multiplanar, multiecho pulse sequences of the orbits and surrounding structures were obtained including fat saturation techniques, before and after intravenous contrast administration. CONTRAST:  5mL GADAVIST GADOBUTROL 1 MMOL/ML IV SOLN COMPARISON:  Brain MRI 07/28/2019 FINDINGS: MRI HEAD FINDINGS Brain: Improved appearance of the brain on diffusion-weighted imaging compared to 08/05/2019. There are still areas of residual diffusion abnormality scattered throughout the supratentorial brain and brainstem, but these are decreased from the prior study. Cerebellar abnormalities have substantially resolved. Areas of  hyperintense T2-weighted signal on FLAIR imaging are unchanged. The areas of abnormal contrast enhancement have also decreased. There is some persistent abnormal leptomeningeal enhancement in the cerebellum and both posterior parietal lobes. Vascular: Normal flow voids Skull and upper cervical spine: Normal bone marrow signal Other: None MRI ORBITS FINDINGS Orbits: There is abnormal contrast enhancement along the cisternal segments of optic nerves (series 41 images 9 and 10). Extraocular muscles are symmetric and normal. Flow voids within the superior ophthalmic veins are normal. Lacrimal glands and fossa are normal. The globes appear normal. Visualized sinuses: Clear Soft tissues: Normal IMPRESSION: 1. Decreased areas of diffusion abnormality, hyperintense T2-weighted signal and abnormal contrast enhancement within the cerebellum and both cerebral hemispheres, consistent with response to antibiotic therapy. 2. Abnormal contrast enhancement of the cisternal segments of both optic nerves is consistent with infectious optic neuropathy. Electronically Signed   By: Deatra Robinson M.D.   On: 08/05/2019 20:01   Dg Chest Port 1 View  Result Date: 08/28/2019 CLINICAL DATA:  Dyspnea, cough, congestion EXAM: PORTABLE CHEST 1 VIEW COMPARISON:  November 27 20 FINDINGS: The heart size and mediastinal contours are within normal limits. Mildly increased patchy density at the right lung base. No pleural effusion or pneumothorax. The visualized skeletal structures are unremarkable. Enteric tube passes into the stomach. IMPRESSION: Small patchy atelectasis/consolidation at the right base. Electronically Signed   By: Guadlupe Spanish M.D.   On: 08/28/2019 12:18   Dg Chest Port 1 View  Result Date: 08/18/2019 CLINICAL DATA:  Aspiration EXAM: PORTABLE CHEST 1 VIEW COMPARISON:  08/09/2019 chest radiograph. FINDINGS: Enteric tube enters stomach with the tip not seen on this image. stable cardiomediastinal silhouette with normal  heart size. No pneumothorax. No pleural effusion. Lungs appear clear, with no acute consolidative airspace disease and no pulmonary edema. IMPRESSION: 1. Enteric tube enters stomach with the tip not seen on this image. 2. No active cardiopulmonary disease. Electronically Signed   By: Delbert Phenix M.D.   On: 08/18/2019 10:26   Dg Chest Port 1 View  Result Date: 08/09/2019 CLINICAL DATA:  Low oxygen saturation. Possible aspiration. NG tube placement. EXAM: PORTABLE CHEST 1 VIEW COMPARISON:  08/03/2019 FINDINGS: Enteric tube in place with tip below the diaphragm, not included in the field of view. Normal heart size and mediastinal contours. No focal airspace disease, pulmonary edema, or pleural effusion. No pneumothorax. No acute osseous abnormalities are seen. Patient is slightly rotated. IMPRESSION: 1. Enteric tube in place with tip below the diaphragm, not included in the field of view. 2. No acute pulmonary findings. Electronically Signed   By: Narda Rutherford M.D.   On: 08/09/2019 15:14   Dg Vangie Bicker G Tube Plc W/fl-no Rad  Result Date: 08/26/2019 OB/GYN ULTRASOUND: Please refer to the "Notes" tab to see imaging impression.   Dg Naso G Tube Plc W/fl-no Rad  Result Date: 08/26/2019  Fluoroscopy was utilized by the requesting physician.  No radiographic interpretation.   Dg Swallowing Func-speech Pathology  Result Date: 08/23/2019 Objective Swallowing Evaluation: Type of Study: MBS-Modified Barium Swallow Study  Patient Details Name: Arthur [name] MRN: [id number] Date of Birth: 10-19-1980 Today's Date: 08/23/2019 Time: SLP Start Time (ACUTE ONLY): 79 -SLP Stop Time (ACUTE ONLY): 1400 SLP Time Calculation (min) (ACUTE ONLY): 25 min Past Medical History: Past Medical History: Diagnosis Date  Anemia   Anxiety   Depression   GERD (gastroesophageal reflux disease)   HIV infection ([place])   Substance abuse ([place])  Past Surgical History: Past Surgical History: Procedure Laterality Date  TOOTH  EXTRACTION   HPI: Pt is a 38 y.o. male admitted with neurosyphillis, CNS toxoplasmosis. MRI 11/1 showed widespread areas concerning for infection, with most dominant abnormality in the L anterior frontal white matter. Hospital course was complicated by hyponatremia and respiratory decline, transferred to ICU on 11/14 but not requiring intubation. Multiple CXRs this admission without acute findings. PMH: substance abuse, HIV, GERD, depression, anxiety, anemia  Subjective: pt upset after swallow study Assessment / Plan / Recommendation CHL IP CLINICAL IMPRESSIONS 08/23/2019 Clinical Impression Pt presents with a severe oral dysphagia with resultant aspiration of thin liquids. Pt has incomplete labial seal, resulting in anterior bolus loss even when his head is in an upright position. SLP provided cues for increased lip seal and tried to administer boluses as far back in his oral cavity as possible in light of reduced mandibular opening. Although pt does have lingual movement, he does not have any posterior propulsion. In fact, at times when he was trying to transit the bolus posteriorly, his lingual movement would push the bolus more anteriorly to his lips and anterior sulcus. SLP provided larger boluses and various delivery methods to assist. Attempted reclining the chair slightly to allow for gravity to assist him with oral transit. Although this did help a small amount of thin liquid get to his pharynx, the liquid spilled passively and directly into his airway without eliciting a swallow. Pt did have a weak and congested cough that occurred spontaneously, suggestive of sensation of aspiration. Aside from the small amount that was aspirated, the remaining boluses, regardless of consistency, were either spilled passively out of his mouth or removed via suction. SLP reviewed results with patient and per his request, also called to share results with his mother. She would like to be present for future sessions whenever  possible. I shared that we will try to coordinate this for this upcoming week. She is motivated to help him so I encouraged her to provide cues focusing on secretion management at this time, including trying to swallow some of his saliva, closing his lips to keep it in his mouth, and trying to cough and use the yankuaer himself.  Will continue to follow and progress as able, but at the moment recommend that pt remain NPO with frequent oral care.  SLP Visit Diagnosis Dysphagia, oropharyngeal phase (R13.12) Attention and concentration deficit following -- Frontal lobe and executive function deficit following -- Impact on safety and function Severe aspiration risk;Risk for inadequate nutrition/hydration   CHL IP TREATMENT RECOMMENDATION 08/23/2019 Treatment Recommendations Therapy as outlined in treatment plan below   Prognosis 08/23/2019 Prognosis for Safe Diet Advancement Good Barriers to Reach Goals Severity of deficits Barriers/Prognosis Comment -- CHL IP DIET RECOMMENDATION 08/23/2019 SLP Diet Recommendations NPO Liquid Administration via -- Medication Administration Via alternative means Compensations -- Postural Changes --   CHL IP OTHER  RECOMMENDATIONS 08/23/2019 Recommended Consults -- Oral Care Recommendations Oral care QID Other Recommendations Have oral suction available   CHL IP FOLLOW UP RECOMMENDATIONS 08/23/2019 Follow up Recommendations Inpatient Rehab   CHL IP FREQUENCY AND DURATION 08/23/2019 Speech Therapy Frequency (ACUTE ONLY) min 2x/week Treatment Duration 2 weeks      CHL IP ORAL PHASE 08/23/2019 Oral Phase Impaired Oral - Pudding Teaspoon -- Oral - Pudding Cup -- Oral - Honey Teaspoon -- Oral - Honey Cup -- Oral - Nectar Teaspoon Weak lingual manipulation;Reduced posterior propulsion;Decreased bolus cohesion;Other (Comment) Oral - Nectar Cup -- Oral - Nectar Straw -- Oral - Thin Teaspoon Weak lingual manipulation;Reduced posterior propulsion;Decreased bolus cohesion;Other (Comment) Oral - Thin  Cup -- Oral - Thin Straw Weak lingual manipulation;Reduced posterior propulsion;Decreased bolus cohesion;Other (Comment) Oral - Puree Weak lingual manipulation;Reduced posterior propulsion;Decreased bolus cohesion;Other (Comment) Oral - Mech Soft -- Oral - Regular -- Oral - Multi-Consistency -- Oral - Pill -- Oral Phase - Comment --  CHL IP PHARYNGEAL PHASE 08/23/2019 Pharyngeal Phase Impaired Pharyngeal- Pudding Teaspoon -- Pharyngeal -- Pharyngeal- Pudding Cup -- Pharyngeal -- Pharyngeal- Honey Teaspoon -- Pharyngeal -- Pharyngeal- Honey Cup -- Pharyngeal -- Pharyngeal- Nectar Teaspoon -- Pharyngeal -- Pharyngeal- Nectar Cup -- Pharyngeal -- Pharyngeal- Nectar Straw -- Pharyngeal -- Pharyngeal- Thin Teaspoon Penetration/Aspiration before swallow Pharyngeal -- Pharyngeal- Thin Cup -- Pharyngeal -- Pharyngeal- Thin Straw -- Pharyngeal -- Pharyngeal- Puree -- Pharyngeal -- Pharyngeal- Mechanical Soft -- Pharyngeal -- Pharyngeal- Regular -- Pharyngeal -- Pharyngeal- Multi-consistency -- Pharyngeal -- Pharyngeal- Pill -- Pharyngeal -- Pharyngeal Comment --  No flowsheet data found. Virl AxeLaura P Nix 08/23/2019, 3:23 PM  Ivar DrapeLaura Nix, M.A. CCC-SLP Acute Rehabilitation Services Pager [phone] Office [phone]             Mr Rockwell GermanyOrbits W GNWo Contrast  Result Date: 08/05/2019 CLINICAL DATA:  Optic neuritis. Possible intracranial infection. Compromised immune status. EXAM: MRI HEAD AND ORBITS WITHOUT AND WITH CONTRAST TECHNIQUE: Multiplanar, multiecho pulse sequences of the brain and surrounding structures were obtained without and with intravenous contrast. Multiplanar, multiecho pulse sequences of the orbits and surrounding structures were obtained including fat saturation techniques, before and after intravenous contrast administration. CONTRAST:  5mL GADAVIST GADOBUTROL 1 MMOL/ML IV SOLN COMPARISON:  Brain MRI 07/28/2019 FINDINGS: MRI HEAD FINDINGS Brain: Improved appearance of the brain on diffusion-weighted imaging  compared to 08/05/2019. There are still areas of residual diffusion abnormality scattered throughout the supratentorial brain and brainstem, but these are decreased from the prior study. Cerebellar abnormalities have substantially resolved. Areas of hyperintense T2-weighted signal on FLAIR imaging are unchanged. The areas of abnormal contrast enhancement have also decreased. There is some persistent abnormal leptomeningeal enhancement in the cerebellum and both posterior parietal lobes. Vascular: Normal flow voids Skull and upper cervical spine: Normal bone marrow signal Other: None MRI ORBITS FINDINGS Orbits: There is abnormal contrast enhancement along the cisternal segments of optic nerves (series 41 images 9 and 10). Extraocular muscles are symmetric and normal. Flow voids within the superior ophthalmic veins are normal. Lacrimal glands and fossa are normal. The globes appear normal. Visualized sinuses: Clear Soft tissues: Normal IMPRESSION: 1. Decreased areas of diffusion abnormality, hyperintense T2-weighted signal and abnormal contrast enhancement within the cerebellum and both cerebral hemispheres, consistent with response to antibiotic therapy. 2. Abnormal contrast enhancement of the cisternal segments of both optic nerves is consistent with infectious optic neuropathy. Electronically Signed   By: Deatra RobinsonKevin  Herman M.D.   On: 08/05/2019 20:01    Labs:  CBC: Recent Labs  08/25/19 0225 08/27/19 0248 08/28/19 0205 08/30/19 0216  WBC 2.0* 1.8* 2.0* 2.1*  HGB 11.3* 10.9* 11.3* 10.6*  HCT 35.8* 34.3* 33.8* 31.5*  PLT 235 199 240 253    COAGS: Recent Labs    07/30/19 0852  INR 1.1    BMP: Recent Labs    08/25/19 0225 08/27/19 0248 08/28/19 0205 08/30/19 0216  NA 143 139 138 132*  K 4.7 4.7 4.3 4.4  CL 116* 111 109 105  CO2 21* 19* 22 20*  GLUCOSE 113* 103* 100* 108*  BUN 26* 22* 20 19  CALCIUM 8.9 8.6* 8.8* 8.6*  CREATININE 0.84 0.80 0.93 0.84  GFRNONAA >60 >60 >60 >60  GFRAA  >60 >60 >60 >60    LIVER FUNCTION TESTS: Recent Labs    08/12/19 1020 08/13/19 0932 08/20/19 0249 08/27/19 0248 08/28/19 0205 08/30/19 0216  BILITOT 0.3 0.4 0.6  --   --  0.3  AST 25 29 39  --   --  40  ALT 18 18 26   --   --  38  ALKPHOS 45 48 51  --   --  77  PROT 8.7* 8.4* 8.7*  --   --  7.0  ALBUMIN 3.1* 2.9* 3.2* 2.6* 2.5* 2.5*    TUMOR MARKERS: No results for input(s): AFPTM, CEA, CA199, CHROMGRNA in the last 8760 hours.  Assessment and Plan: 38 y.o. male with hx of advanced HIV and wasting syndrome, prior substance abuse, CNS syphilis/toxoplasmosis with associated optic neuritis, GERD, encephalopathy, severe protein calorie malnutrition and dysphagia currently with NG tube in place.  Request now received from primary team for percutaneous gastrostomy tube placement.  Patient was Covid- 19 negative on 07/28/19. Palliative care involved and anticipate home with [facility] under mother's care at discharge. Pt afebrile but with very soft BP's; no labs today; latest CT abdomen has been reviewed by Dr. [facility] and anatomy appears amenable to gastrostomy tube placement.Risks and benefits image guided gastrostomy tube placement was discussed with the patient's mother Arthur [name]  including, but not limited to the need for a barium enema during the procedure, bleeding, infection, peritonitis and/or damage to adjacent structures.  All of the patient's mother's questions were answered, patient's mother is agreeable to proceed with placement of G tube in son  Consent signed and in chart.  Will check f/u labs in am and proceed with tube placement next week if hemodynamically stable; ideally would like systolic BP's over 100 before administering IV conscious sedation to patient for procedure.   Thank you for this interesting consult.  I greatly enjoyed meeting Arthur [name] and look forward to participating in their care.  A copy of this report was sent to the requesting provider on this  date.  Electronically Signed: D. [name], PA-C 09/01/2019, 9:40 AM   I spent a total of  30 minutes   in face to face in clinical consultation, greater than 50% of which was counseling/coordinating care for possible percutaneous gastrostomy tube placement

## 2019-09-01 NOTE — Progress Notes (Signed)
Patient ID: Arthur [name], male   DOB: [birth year], 38 y.o.   MRN: [id number]  PROGRESS NOTE    Arthur [name]  PJA:[id number] DOB: 1981/09/19 DOA: 07/28/2019 PCP: Truman Hayward, MD   Brief Narrative:  38 year old male with history of HIV, substance abuse, depression and GERD presented on 07/28/2019 with dizziness, weakness and intermittent headache with noncompliance to his HIV medications for about 1 year due to affordability issue.  MRI of the brain in the ED showed widespread areas of abnormal low level restricted diffusion and postcontrast enhancement throughout both cerebral hemispheres, predominantly infratentorial with the dominant abnormality in the left anterior frontal white matter consistent with an opportunistic infection, such as toxoplasmosis, tuberculosis or fungal cerebritis/meningitis, or parasitic infection.  He was admitted; ID was consulted.  He then revealed that he had lost his vision in his right eye.  He was started on penicillin and ganciclovir for possible CMV retinitis versus synovitis.  Ophthalmology was consulted.  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  Assessment & Plan:   CNS toxoplasmosis AIDS/untreated HIV Oral thrush -Currently on pyrimethamine-leucovorin, sulfadiazine.  Continue induction phase treatment for total of 6 weeks, then switch to chronic phase as per ID.  ID signed off on 08/30/2019 and recommend reconsulting ID as patient gets closer to discharge. -Continue tivicay plus Descovy -Continue high-dose Diflucan for 7 days till 09/04/2019. -Continue Bactrim for prophylaxis -CSF was positive for VDRL.  Patient has completed penicillin treatment -Positive for CMV: Initially treated with ganciclovir which has subsequently been discontinued with negative CNS PCR -TB Gold plus negative.  MTB probe negative.  RIPE treatment stopped  Acute toxic/metabolic encephalopathy -Multifactorial including CNS infection and SIADH -Repeat  CT head without new process, evolving lesions of cerebellum and left frontal lobe -MRI brain with multifocal signal abnormality and abnormal contrast-enhancement, most consistent with infectious cerebritis, consistent with cerebral toxoplasmosis, no discrete abscess -Monitor mental status.  Right eye blindness, optic retinitis/neuritis -Patient has been evaluated by ophthalmology.  He received steroid injection on 08/02/2019 -Repeat MRI brain and orbits show improvement of brain lesions and optic neuritis, retinitis  Anemia of chronic disease -Hemoglobin stable. 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  Was treated with 3% saline and IV Lasix.  Nephrology consulted and subsequently signed off on 08/16/2019 -Resolved.  Sodium stable.  Acute kidney injury -Resolved.  Stable  Dysphagia Severe protein calorie malnutrition/AIDS wasting syndrome -Tube feeding managed by dietitian.   -SLP following and is still recommending n.p.o. and PEG tube placement.  Patient/patient's mother have agreed for PEG tube placement.  IR has been consulted for the same.  DVT prophylaxis: Lovenox Code Status: Full Family Communication: Spoke to mother on phone on 08/31/2019. disposition Plan: Depends on clinical outcome  Consultants: ID/palliative care/ophthalmology/critical care/nephrology  Procedures: LP  Antimicrobials:  Anti-infectives (From admission, onward)   Start     Dose/Rate Route Frequency Ordered Stop   08/26/19 1600  dolutegravir (TIVICAY) tablet 50 mg     50 mg Oral Daily 08/26/19 1426     08/26/19 1600  emtricitabine-tenofovir AF (DESCOVY) 200-25 MG per tablet 1 tablet     1 tablet Oral Daily 08/26/19 1426     08/23/19 0900  sulfamethoxazole-trimethoprim (BACTRIM) 200-40 MG/5ML suspension 20 mL  Status:  Discontinued     20 mL Oral 3 times weekly 08/21/19 0950 08/21/19 1311   08/23/19 0900  sulfamethoxazole-trimethoprim (BACTRIM) 200-40 MG/5ML suspension 20 mL     20  mL Per Tube  3 times weekly 08/21/19 1311     08/21/19 2000  Pyrimethamine-Leucovorin 50-25 MG CAPS 50 mg     50 mg Per Tube Daily 08/21/19 1312     08/21/19 1100  fluconazole (DIFLUCAN) IVPB 400 mg     400 mg 100 mL/hr over 120 Minutes Intravenous Every 24 hours 08/21/19 1009     08/20/19 1200  sulfaDIAZINE tablet 1,000 mg     1,000 mg Per Tube Every 6 hours 08/20/19 1118     08/19/19 1015  sulfamethoxazole-trimethoprim (BACTRIM DS) 800-160 MG per tablet 1 tablet  Status:  Discontinued     1 tablet Oral 3 times weekly 08/19/19 1002 08/21/19 0950   08/16/19 2000  Pyrimethamine-Leucovorin 50-25 MG CAPS 50 mg  Status:  Discontinued     50 mg Oral Daily 08/15/19 1337 08/15/19 1357   08/16/19 2000  Pyrimethamine-Leucovorin 50-25 MG CAPS 50 mg  Status:  Discontinued     50 mg Oral Daily 08/15/19 1357 08/21/19 1312   08/15/19 2000  Pyrimethamine-Leucovorin 50-25 MG CAPS 200 mg  Status:  Discontinued     200 mg Oral  Once 08/15/19 1337 08/15/19 1357   08/15/19 2000  Pyrimethamine-Leucovorin 50-25 MG CAPS 200 mg     200 mg Oral  Once 08/15/19 1357 08/15/19 2114   08/15/19 1800  sulfaDIAZINE tablet 1,000 mg  Status:  Discontinued     1,000 mg Oral Every 6 hours 08/15/19 1337 08/20/19 1118   08/14/19 1000  demeclocycline (DECLOMYCIN) tablet 150 mg  Status:  Discontinued     150 mg Oral Every 12 hours 08/14/19 0848 08/14/19 0907   08/14/19 1000  demeclocycline (DECLOMYCIN) tablet 150 mg  Status:  Discontinued     150 mg Per Tube Every 12 hours 08/14/19 0907 08/19/19 1030   08/14/19 1000  fluconazole (DIFLUCAN) tablet 200 mg     200 mg Per Tube Daily 08/14/19 0907 08/18/19 1055   08/13/19 1015  fluconazole (DIFLUCAN) tablet 200 mg  Status:  Discontinued     200 mg Oral Daily 08/13/19 1009 08/14/19 0907   08/13/19 1000  penicillin g benzathine (BICILLIN LA) 1200000 UNIT/2ML injection 2.4 Million Units     2.4 Million Units Intramuscular  Once 08/08/19 1019 08/13/19 1222   08/12/19 1400   sulfamethoxazole-trimethoprim (BACTRIM) 272.48 mg in dextrose 5 % 500 mL IVPB  Status:  Discontinued     10 mg/kg/day  54.5 kg 344.7 mL/hr over 90 Minutes Intravenous Every 12 hours 08/12/19 0854 08/15/19 1337   08/11/19 1400  sulfamethoxazole-trimethoprim (BACTRIM) 400-80 MG per tablet 3 tablet  Status:  Discontinued     3 tablet Oral Every 8 hours 08/11/19 1349 08/12/19 0854   08/10/19 1000  abacavir-dolutegravir-lamiVUDine (TRIUMEQ) 600-50-300 MG per tablet 1 tablet  Status:  Discontinued     1 tablet Oral Daily 08/09/19 1514 08/12/19 0848   08/10/19 1000  sulfamethoxazole-trimethoprim (BACTRIM) 160 mg in dextrose 5 % 250 mL IVPB  Status:  Discontinued     160 mg 260 mL/hr over 60 Minutes Intravenous Daily 08/09/19 1514 08/11/19 1324   08/09/19 1730  metroNIDAZOLE (FLAGYL) IVPB 500 mg     500 mg 100 mL/hr over 60 Minutes Intravenous Every 8 hours 08/09/19 1636 08/13/19 1848   08/08/19 1000  rifampin (RIFADIN) 60 mg/mL oral suspension 600 mg  Status:  Discontinued     600 mg Per Tube Daily 08/07/19 1106 08/07/19 1319   08/08/19 1000  bictegravir-emtricitabine-tenofovir AF (BIKTARVY) 50-200-25 MG per tablet 1 tablet  Status:  Discontinued     1 tablet Oral Daily 08/07/19 1419 08/09/19 1514   08/07/19 1330  bictegravir-emtricitabine-tenofovir AF (BIKTARVY) 50-200-25 MG per tablet 1 tablet  Status:  Discontinued     1 tablet Oral Daily 08/07/19 1319 08/07/19 1419   08/07/19 1000  ethambutol (MYAMBUTOL) tablet 1,200 mg  Status:  Discontinued     1,200 mg Per Tube Daily 08/06/19 1423 08/07/19 1319   08/07/19 1000  isoniazid (NYDRAZID) tablet 300 mg  Status:  Discontinued     300 mg Per Tube Daily 08/06/19 1423 08/07/19 1319   08/07/19 1000  rifampin (RIFADIN) 60 mg/mL oral suspension 600 mg  Status:  Discontinued     600 mg Oral Daily 08/06/19 1423 08/07/19 1106   08/07/19 1000  sulfamethoxazole-trimethoprim (BACTRIM DS) 800-160 MG per tablet 1 tablet  Status:  Discontinued     1 tablet Per  Tube Daily 08/06/19 1423 08/09/19 1514   08/07/19 1000  pyrazinamide tablet 1,500 mg  Status:  Discontinued     1,500 mg Per Tube Daily 08/06/19 1423 08/07/19 1319   08/03/19 1600  fluconazole (DIFLUCAN) IVPB 200 mg  Status:  Discontinued     200 mg 100 mL/hr over 60 Minutes Intravenous Every 24 hours 08/03/19 1548 08/13/19 1009   07/30/19 1700  rifampin (RIFADIN) capsule 600 mg  Status:  Discontinued     600 mg Oral Daily 07/30/19 1604 08/06/19 1423   07/30/19 1700  isoniazid (NYDRAZID) tablet 300 mg  Status:  Discontinued     300 mg Oral Daily 07/30/19 1604 08/06/19 1423   07/30/19 1700  pyrazinamide tablet 1,500 mg  Status:  Discontinued     1,500 mg Oral Daily 07/30/19 1604 08/06/19 1423   07/30/19 1700  ethambutol (MYAMBUTOL) tablet 1,200 mg  Status:  Discontinued     1,200 mg Oral Daily 07/30/19 1604 08/06/19 1423   07/29/19 1700  sulfamethoxazole-trimethoprim (BACTRIM DS) 800-160 MG per tablet 1 tablet  Status:  Discontinued     1 tablet Oral Daily 07/29/19 1601 08/06/19 1423   07/29/19 1700  ganciclovir (CYTOVENE) 285 mg in sodium chloride 0.9 % 100 mL IVPB  Status:  Discontinued     5 mg/kg  57 kg 100 mL/hr over 60 Minutes Intravenous Every 12 hours 07/29/19 1603 08/07/19 1526   07/29/19 1630  penicillin G potassium 12 Million Units in dextrose 5 % 250 mL IVPB  Status:  Discontinued     12 Million Units 250 mL/hr over 60 Minutes Intravenous Every 12 hours 07/29/19 1552 07/29/19 1559   07/29/19 1630  penicillin G potassium 12 Million Units in dextrose 5 % 500 mL continuous infusion     12 Million Units 41.7 mL/hr over 12 Hours Intravenous Every 12 hours 07/29/19 1559 08/12/19 2359       Subjective: Patient seen and examined at bedside.  Very poor historian.  Awake, follows some simple commands by nodding head.  No overnight fever, vomiting noted by nursing staff.   Objective: Vitals:   08/31/19 1937 08/31/19 2333 09/01/19 0300 09/01/19 0722  BP: (!) 85/54 98/61 (!) 91/58    Pulse: 76 73 69 71  Resp: 18 (!) Temp: 97.8 F (36.6 C) (!) 97.5 F (36.4 C) (!) 97.3 F (36.3 C)   TempSrc: Axillary Oral Axillary   SpO2: 94% 98% 95% 91%  Weight:   50.4 kg   Height:        Intake/Output Summary (Last 24 hours) at 09/01/2019 0749 Last data  filed at 09/01/2019 0700 Gross per 24 hour  Intake 1456.75 ml  Output 2025 ml  Net -568.25 ml   Filed Weights   08/30/19 0343 08/31/19 0500 09/01/19 0300  Weight: 50.7 kg 51.2 kg 50.4 kg    Examination:  General exam: No acute distress.  Awake, hardly answers any questions.  Follows some commands.  Cortrak in place Respiratory system: Bilateral decreased breath sounds at bases with scattered crackles.  No wheezing  cardiovascular system: S1-S2 heard, rate controlled Gastrointestinal system: Abdomen is nondistended, soft and nontender. Normal bowel sounds heard. Extremities: No cyanosis, edema   Data Reviewed: I have personally reviewed following labs and imaging studies  CBC: Recent Labs  Lab 08/27/19 0248 08/28/19 0205 08/30/19 0216  WBC 1.8* 2.0* 2.1*  NEUTROABS 0.8* 1.0* 1.2*  HGB 10.9* 11.3* 10.6*  HCT 34.3* 33.8* 31.5*  MCV 96.3 92.6 91.0  PLT 199 240 253   Basic Metabolic Panel: Recent Labs  Lab 08/27/19 0248 08/28/19 0205 08/30/19 0216  NA 139 138 132*  K 4.7 4.3 4.4  CL 111 109 105  CO2 19* 22 20*  GLUCOSE 103* 100* 108*  BUN 22* 20 19  CREATININE 0.80 0.93 0.84  CALCIUM 8.6* 8.8* 8.6*  MG 1.9 2.0 1.8  PHOS 3.4 3.2  --    GFR: Estimated Creatinine Clearance: 86.7 mL/min (by C-G formula based on SCr of 0.84 mg/dL). Liver Function Tests: Recent Labs  Lab 08/27/19 0248 08/28/19 0205 08/30/19 0216  AST  --   --  40  ALT  --   --  38  ALKPHOS  --   --  77  BILITOT  --   --  0.3  PROT  --   --  7.0  ALBUMIN 2.6* 2.5* 2.5*   No results for input(s): LIPASE, AMYLASE in the last 168 hours. No results for input(s): AMMONIA in the last 168 hours. Coagulation Profile: No  results for input(s): INR, PROTIME in the last 168 hours. Cardiac Enzymes: No results for input(s): CKTOTAL, CKMB, CKMBINDEX, TROPONINI in the last 168 hours. BNP (last 3 results) No results for input(s): PROBNP in the last 8760 hours. HbA1C: No results for input(s): HGBA1C in the last 72 hours. CBG: Recent Labs  Lab 08/31/19 0902 08/31/19 1644 08/31/19 1957 08/31/19 2358 09/01/19 0405  GLUCAP 107* 93 106* 115* 117*   Lipid Profile: No results for input(s): CHOL, HDL, LDLCALC, TRIG, CHOLHDL, LDLDIRECT in the last 72 hours. Thyroid Function Tests: No results for input(s): TSH, T4TOTAL, FREET4, T3FREE, THYROIDAB in the last 72 hours. Anemia Panel: No results for input(s): VITAMINB12, FOLATE, FERRITIN, TIBC, IRON, RETICCTPCT in the last 72 hours. Sepsis Labs: No results for input(s): PROCALCITON, LATICACIDVEN in the last 168 hours.  No results found for this or any previous visit (from the past 240 hour(s)).       Radiology Studies: Ct Abdomen Wo Contrast  Result Date: 08/31/2019 CLINICAL DATA:  Evaluate anatomy for potential percutaneous gastrostomy tube placement. EXAM: CT ABDOMEN WITHOUT CONTRAST TECHNIQUE: Multidetector CT imaging of the abdomen was performed following the standard protocol without IV contrast. COMPARISON:  None. FINDINGS: Lack of intravenous contrast limits the ability to evaluate solid abdominal organs. Lower chest: Limited visualization of the lower thorax demonstrates consolidative opacities within the image right lower lobe with associated air bronchograms (image 7, series 4). Ill-defined slight tree-in-bud nodular opacities are seen within the contralateral left lower lobe (image 8, series 4). No pleural effusion. Normal heart size. No pericardial effusion. There is  diffuse decreased attenuation of the intra cardiac blood pool as could be seen in the setting of anemia. Hepatobiliary: Normal hepatic contour. The gallbladder is underdistended. No ascites.  Pancreas: Suboptimally evaluated on this noncontrast examination. Spleen: Normal noncontrast appearance of the spleen. Adrenals/Urinary Tract: Normal noncontrast appearance of the bilateral kidneys. No renal stones. No urine obstruction. Normal noncontrast appearance the bilateral adrenal glands. Urinary bladder was not imaged. Stomach/Bowel: The anterior wall of the stomach appears well apposed against the ventral abdominal wall without interposed hepatic parenchyma or transverse colon, likely suspected improvement in potential percutaneous access window with gastric insufflation. Enteric tube tip terminates within the proximal duodenum. Large colonic stool burden without evidence of enteric obstruction. No pneumoperitoneum, pneumatosis or portal venous gas. Vascular/Lymphatic: Normal caliber of the abdominal aorta. No bulky retroperitoneal or mesenteric adenopathy on this noncontrast examination. Other: Regional soft tissues appear normal. Musculoskeletal: No acute or aggressive osseous abnormalities. IMPRESSION: 1. Gastric anatomy amenable to potential percutaneous gastrostomy tube placement as indicated. 2. Consolidative opacities with associated air bronchograms within the image right lower lobe with scattered ill-defined tree-in-bud opacities within the imaged left, nonspecific though could be seen in the setting infection and/or aspiration. Electronically Signed   By: Simonne ComeJohn  Watts M.D.   On: 08/31/2019 12:17        Scheduled Meds: . dolutegravir  50 mg Oral Daily  . emtricitabine-tenofovir AF  1 tablet Oral Daily  . enoxaparin (LOVENOX) injection  40 mg Subcutaneous Q24H  . famotidine  20 mg Per Tube Daily  . feeding supplement (PRO-STAT SUGAR FREE 64)  30 mL Per Tube Daily  . free water  100 mL Per Tube Q8H  . insulin aspart  0-9 Units Subcutaneous Q4H  . mouth rinse  15 mL Mouth Rinse BID  . multivitamin  15 mL Per Tube Daily  . Pyrimethamine-Leucovorin  50 mg Per Tube Daily  . sulfaDIAZINE   1,000 mg Per Tube Q6H  . sulfamethoxazole-trimethoprim  20 mL Per Tube 3 times weekly  . terbinafine   Topical Daily   Continuous Infusions: . chlorproMAZINE (THORAZINE) IV 12.5 mg (08/09/19 1135)  . feeding supplement (OSMOLITE 1.5 CAL) 65 mL/hr at 09/01/19 0700  . fluconazole (DIFLUCAN) IV 400 mg (08/31/19 1050)          Glade LloydKshitiz Caydence Enck, MD [facility] 09/01/2019, 7:49 AM

## 2019-09-02 ENCOUNTER — Encounter (HOSPITAL_COMMUNITY): Payer: Self-pay | Admitting: Interventional Radiology

## 2019-09-02 ENCOUNTER — Inpatient Hospital Stay (HOSPITAL_COMMUNITY): Payer: Medicaid Other

## 2019-09-02 HISTORY — PX: IR GASTROSTOMY TUBE MOD SED: IMG625

## 2019-09-02 LAB — CBC WITH DIFFERENTIAL/PLATELET
Abs Immature Granulocytes: 0.19 10*3/uL — ABNORMAL HIGH (ref 0.00–0.07)
Basophils Absolute: 0 10*3/uL (ref 0.0–0.1)
Basophils Relative: 1 %
Eosinophils Absolute: 0 10*3/uL (ref 0.0–0.5)
Eosinophils Relative: 1 %
HCT: 31.9 % — ABNORMAL LOW (ref 39.0–52.0)
Hemoglobin: 10.8 g/dL — ABNORMAL LOW (ref 13.0–17.0)
Immature Granulocytes: 10 %
Lymphocytes Relative: 25 %
Lymphs Abs: 0.5 10*3/uL — ABNORMAL LOW (ref 0.7–4.0)
MCH: 30.9 pg (ref 26.0–34.0)
MCHC: 33.9 g/dL (ref 30.0–36.0)
MCV: 91.1 fL (ref 80.0–100.0)
Monocytes Absolute: 0.3 10*3/uL (ref 0.1–1.0)
Monocytes Relative: 17 %
Neutro Abs: 0.9 10*3/uL — ABNORMAL LOW (ref 1.7–7.7)
Neutrophils Relative %: 46 %
Platelets: 246 10*3/uL (ref 150–400)
RBC: 3.5 MIL/uL — ABNORMAL LOW (ref 4.22–5.81)
RDW: 15.9 % — ABNORMAL HIGH (ref 11.5–15.5)
WBC: 1.9 10*3/uL — ABNORMAL LOW (ref 4.0–10.5)
nRBC: 0 % (ref 0.0–0.2)

## 2019-09-02 LAB — COMPREHENSIVE METABOLIC PANEL
ALT: 34 U/L (ref 0–44)
AST: 37 U/L (ref 15–41)
Albumin: 2.4 g/dL — ABNORMAL LOW (ref 3.5–5.0)
Alkaline Phosphatase: 80 U/L (ref 38–126)
Anion gap: 5 (ref 5–15)
BUN: 16 mg/dL (ref 6–20)
CO2: 24 mmol/L (ref 22–32)
Calcium: 8.8 mg/dL — ABNORMAL LOW (ref 8.9–10.3)
Chloride: 106 mmol/L (ref 98–111)
Creatinine, Ser: 0.74 mg/dL (ref 0.61–1.24)
GFR calc Af Amer: >60 mL/min (ref >60)
GFR calc non Af Amer: >60 mL/min (ref >60)
Glucose, Bld: 88 mg/dL (ref 70–99)
Potassium: 4.5 mmol/L (ref 3.5–5.1)
Sodium: 135 mmol/L (ref 135–145)
Total Bilirubin: 0.2 mg/dL — ABNORMAL LOW (ref 0.3–1.2)
Total Protein: 7.2 g/dL (ref 6.5–8.1)

## 2019-09-02 LAB — GLUCOSE, CAPILLARY
Glucose-Capillary: 124 mg/dL — ABNORMAL HIGH (ref 70–99)
Glucose-Capillary: 72 mg/dL (ref 70–99)
Glucose-Capillary: 74 mg/dL (ref 70–99)
Glucose-Capillary: 81 mg/dL (ref 70–99)
Glucose-Capillary: 92 mg/dL (ref 70–99)
Glucose-Capillary: 94 mg/dL (ref 70–99)

## 2019-09-02 LAB — PROTIME-INR
INR: 1 (ref 0.8–1.2)
Prothrombin Time: 13.1 seconds (ref 11.4–15.2)

## 2019-09-02 IMAGING — XA IR PERC PLACEMENT GASTROSTOMY
1 series · 4 of 4 positions shown · non-contrast
Comparison: none

INDICATION: 38-year-old male with dysphagia

[Series 1: fl angio · 4 of 43 frames shown]
[frame 7/43]
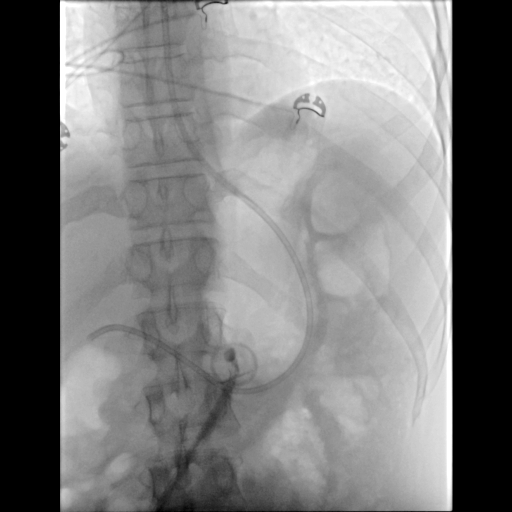
[frame 17/43]
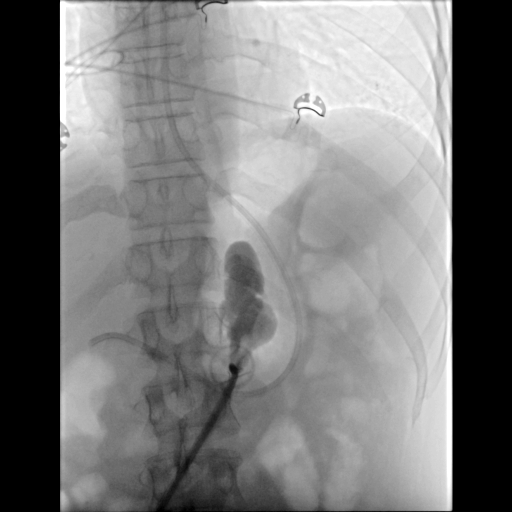
[frame 22/43]
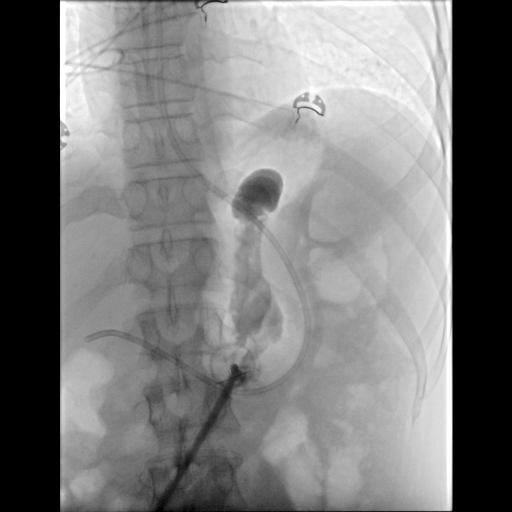
[frame 37/43]
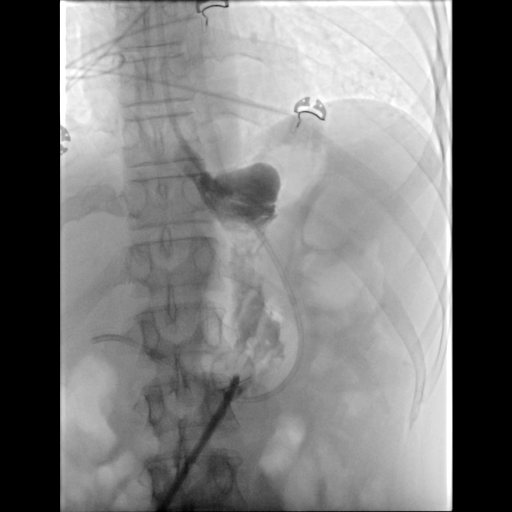

[4 of 4 positions shown; findings below may reference images not displayed]

EXAM:
PERC PLACEMENT GASTROSTOMY

MEDICATIONS:
2 g Ancef; Antibiotics were administered within 1 hour of the
procedure. Scratch

ANESTHESIA/SEDATION:
Versed 0.5 mg IV; Fentanyl 25 mcg IV

Moderate Sedation Time:  10 minutes

The patient was continuously monitored during the procedure by the
interventional radiology nurse under my direct supervision.

CONTRAST:  5mL OMNIPAQUE IOHEXOL 300 MG/ML SOLN - administered into
the gastric lumen.

FLUOROSCOPY TIME:  Fluoroscopy Time: 1 minutes 6 seconds (6 mGy).

COMPLICATIONS:
None

PROCEDURE:
Informed written consent was obtained from the patient and the
patient's family after a thorough discussion of the procedural
risks, benefits and alternatives. All questions were addressed.
Maximal Sterile Barrier Technique was utilized including caps, mask,
sterile gowns, sterile gloves, sterile drape, hand hygiene and skin
antiseptic. A timeout was performed prior to the initiation of the
procedure.

The epigastrium was prepped with Betadine in a sterile fashion, and
a sterile drape was applied covering the operative field. A sterile
gown and sterile gloves were used for the procedure.

A 5-French orogastric tube is placed under fluoroscopic guidance.
Scout imaging of the abdomen confirms barium within the transverse
colon.

The stomach was distended with gas. Under fluoroscopic guidance, an
18 gauge needle was utilized to puncture the anterior wall of the
body of the stomach. An Amplatz wire was advanced through the needle
passing a T fastener into the lumen of the stomach. The T fastener
was secured for gastropexy. A 9-French sheath was inserted.

A snare was advanced through the 9-French sheath. MANGE was
advanced through the orogastric tube. It was snared then pulled out
the oral cavity, pulling the snare, as well. The leading edge of the
gastrostomy was attached to the snare. It was then pulled down the
esophagus and out the percutaneous site. Tube secured in place.
Contrast was injected.

Patient tolerated the procedure well and remained hemodynamically
stable throughout.

No complications were encountered and no significant blood loss
encountered.
IMPRESSION: Status post fluoroscopic placed percutaneous gastrostomy tube, with
20 French pull-through.

## 2019-09-02 MED ORDER — LIDOCAINE HCL 1 % IJ SOLN
INTRAMUSCULAR | Status: AC | PRN
  Administered 2019-09-02: 8 mL

## 2019-09-02 MED ORDER — MIDAZOLAM HCL 2 MG/2ML IJ SOLN
INTRAMUSCULAR | Status: AC | PRN
  Administered 2019-09-02: 0.5 mg via INTRAVENOUS

## 2019-09-02 MED ORDER — MIDAZOLAM HCL 2 MG/2ML IJ SOLN
INTRAMUSCULAR | Status: AC
  Filled 2019-09-02: qty 2

## 2019-09-02 MED ORDER — LIDOCAINE HCL 1 % IJ SOLN
INTRAMUSCULAR | Status: AC
  Filled 2019-09-02: qty 20

## 2019-09-02 MED ORDER — FENTANYL CITRATE (PF) 100 MCG/2ML IJ SOLN
INTRAMUSCULAR | Status: AC
  Filled 2019-09-02: qty 2

## 2019-09-02 MED ORDER — GLUCAGON HCL RDNA (DIAGNOSTIC) 1 MG IJ SOLR
INTRAMUSCULAR | Status: AC
  Filled 2019-09-02: qty 1

## 2019-09-02 MED ORDER — FENTANYL CITRATE (PF) 100 MCG/2ML IJ SOLN
INTRAMUSCULAR | Status: AC | PRN
  Administered 2019-09-02: 25 ug via INTRAVENOUS

## 2019-09-02 MED ORDER — CEFAZOLIN SODIUM-DEXTROSE 2-4 GM/100ML-% IV SOLN
INTRAVENOUS | Status: AC
  Administered 2019-09-02: 2 g via INTRAVENOUS
  Filled 2019-09-02: qty 100

## 2019-09-02 MED ORDER — TRAMADOL HCL 50 MG PO TABS
50.0000 mg | ORAL_TABLET | Freq: Four times a day (QID) | ORAL | Status: DC | PRN
  Administered 2019-09-02 – 2019-09-06 (×4): 50 mg
  Filled 2019-09-02 (×4): qty 1

## 2019-09-02 MED ORDER — IOHEXOL 300 MG/ML  SOLN
50.0000 mL | Freq: Once | INTRAMUSCULAR | Status: AC | PRN
  Administered 2019-09-02: 5 mL

## 2019-09-02 NOTE — Procedures (Signed)
Interventional Radiology Procedure Note  Procedure: Placement of percutaneous 35F pull-through gastrostomy tube. Complications: None  Findings: Last contrast injection shows frank GE reflux, which manifested by expectoration/cough/emesis by the patient.   Recommendations: - NPO except for sips and chips remainder of today and overnight - Maintain G-tube to LWS until tomorrow morning  - May advance diet as tolerated and begin using tube tomorrow morning - Note reflux that was observed  Signed,   Dulcy Fanny. Earleen Newport, DO

## 2019-09-02 NOTE — Progress Notes (Signed)
Patient ID: Arthur [name], male   DOB: [birth year], 38 y.o.   MRN: [id number]  PROGRESS NOTE    Arthur [name]  JXB:[id number] DOB: December 22, 1980 DOA: 07/28/2019 PCP: Randall Hiss, MD   Brief Narrative:  38 year old male with history of HIV, substance abuse, depression and GERD presented on 07/28/2019 with dizziness, weakness and intermittent headache with noncompliance to his HIV medications for about 1 year due to affordability issue.  MRI of the brain in the ED showed widespread areas of abnormal low level restricted diffusion and postcontrast enhancement throughout both cerebral hemispheres, predominantly infratentorial with the dominant abnormality in the left anterior frontal white matter consistent with an opportunistic infection, such as toxoplasmosis, tuberculosis or fungal cerebritis/meningitis, or parasitic infection.  He was admitted; ID was consulted.  He then revealed that he had lost his vision in his right eye.  He was started on penicillin and ganciclovir for possible CMV retinitis versus synovitis.  Ophthalmology was consulted.  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  Assessment & Plan:   CNS toxoplasmosis AIDS/untreated HIV Oral thrush -Currently on pyrimethamine-leucovorin, sulfadiazine.  Continue induction phase treatment for total of 6 weeks, then switch to chronic phase as per ID.  ID signed off on 08/30/2019 and recommend reconsulting ID as patient gets closer to discharge. -Continue tivicay plus Descovy -Continue high-dose Diflucan for 7 days till 09/04/2019. -Continue Bactrim for prophylaxis -CSF was positive for VDRL.  Patient has completed penicillin treatment -Positive for CMV: Initially treated with ganciclovir which has subsequently been discontinued with negative CNS PCR -TB Gold plus negative.  MTB probe negative.  RIPE treatment stopped  Acute toxic/metabolic encephalopathy -Multifactorial including CNS infection and SIADH -Repeat  CT head without new process, evolving lesions of cerebellum and left frontal lobe -MRI brain with multifocal signal abnormality and abnormal contrast-enhancement, most consistent with infectious cerebritis, consistent with cerebral toxoplasmosis, no discrete abscess -Monitor mental status.  Right eye blindness, optic retinitis/neuritis -Patient has been evaluated by ophthalmology.  He received steroid injection on 08/02/2019 -Repeat MRI brain and orbits show improvement of brain lesions and optic neuritis, retinitis  Anemia of chronic disease -Hemoglobin stable. will monitor  Leukopenia-probably from AIDS.  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  Was treated with 3% saline and IV Lasix.  Nephrology consulted and subsequently signed off on 08/16/2019 -Resolved.  Sodium stable.  Acute kidney injury -Resolved.  Stable  Dysphagia Severe protein calorie malnutrition/AIDS wasting syndrome -Tube feeding managed by dietitian.   -SLP following and is still recommending n.p.o. and PEG tube placement.  Patient/patient's mother have agreed for PEG tube placement.  For possible IR guided PEG tube patient today.   DVT prophylaxis: Lovenox Code Status: Full Family Communication: Spoke to mother on phone on 08/31/2019. disposition Plan: Depends on clinical outcome  Consultants: ID/palliative care/ophthalmology/critical care/nephrology  Procedures: LP  Antimicrobials:  Anti-infectives (From admission, onward)   Start     Dose/Rate Route Frequency Ordered Stop   09/01/19 1500  ceFAZolin (ANCEF) IVPB 2g/100 mL premix     2 g 200 mL/hr over 30 Minutes Intravenous To Radiology 09/01/19 0958 09/02/19 1500   08/26/19 1600  dolutegravir (TIVICAY) tablet 50 mg     50 mg Oral Daily 08/26/19 1426     08/26/19 1600  emtricitabine-tenofovir AF (DESCOVY) 200-25 MG per tablet 1 tablet     1 tablet Oral Daily 08/26/19 1426     08/23/19 0900  sulfamethoxazole-trimethoprim  (BACTRIM) 200-40 MG/5ML suspension 20 mL  Status:  Discontinued     20 mL Oral 3 times weekly 08/21/19 0950 08/21/19 1311   08/23/19 0900  sulfamethoxazole-trimethoprim (BACTRIM) 200-40 MG/5ML suspension 20 mL     20 mL Per Tube 3 times weekly 08/21/19 1311     08/21/19 2000  Pyrimethamine-Leucovorin 50-25 MG CAPS 50 mg     50 mg Per Tube Daily 08/21/19 1312     08/21/19 1100  fluconazole (DIFLUCAN) IVPB 400 mg     400 mg 100 mL/hr over 120 Minutes Intravenous Every 24 hours 08/21/19 1009     08/20/19 1200  sulfaDIAZINE tablet 1,000 mg     1,000 mg Per Tube Every 6 hours 08/20/19 1118     08/19/19 1015  sulfamethoxazole-trimethoprim (BACTRIM DS) 800-160 MG per tablet 1 tablet  Status:  Discontinued     1 tablet Oral 3 times weekly 08/19/19 1002 08/21/19 0950   08/16/19 2000  Pyrimethamine-Leucovorin 50-25 MG CAPS 50 mg  Status:  Discontinued     50 mg Oral Daily 08/15/19 1337 08/15/19 1357   08/16/19 2000  Pyrimethamine-Leucovorin 50-25 MG CAPS 50 mg  Status:  Discontinued     50 mg Oral Daily 08/15/19 1357 08/21/19 1312   08/15/19 2000  Pyrimethamine-Leucovorin 50-25 MG CAPS 200 mg  Status:  Discontinued     200 mg Oral  Once 08/15/19 1337 08/15/19 1357   08/15/19 2000  Pyrimethamine-Leucovorin 50-25 MG CAPS 200 mg     200 mg Oral  Once 08/15/19 1357 08/15/19 2114   08/15/19 1800  sulfaDIAZINE tablet 1,000 mg  Status:  Discontinued     1,000 mg Oral Every 6 hours 08/15/19 1337 08/20/19 1118   08/14/19 1000  demeclocycline (DECLOMYCIN) tablet 150 mg  Status:  Discontinued     150 mg Oral Every 12 hours 08/14/19 0848 08/14/19 0907   08/14/19 1000  demeclocycline (DECLOMYCIN) tablet 150 mg  Status:  Discontinued     150 mg Per Tube Every 12 hours 08/14/19 0907 08/19/19 1030   08/14/19 1000  fluconazole (DIFLUCAN) tablet 200 mg     200 mg Per Tube Daily 08/14/19 0907 08/18/19 1055   08/13/19 1015  fluconazole (DIFLUCAN) tablet 200 mg  Status:  Discontinued     200 mg Oral Daily 08/13/19  1009 08/14/19 0907   08/13/19 1000  penicillin g benzathine (BICILLIN LA) 1200000 UNIT/2ML injection 2.4 Million Units     2.4 Million Units Intramuscular  Once 08/08/19 1019 08/13/19 1222   08/12/19 1400  sulfamethoxazole-trimethoprim (BACTRIM) 272.48 mg in dextrose 5 % 500 mL IVPB  Status:  Discontinued     10 mg/kg/day  54.5 kg 344.7 mL/hr over 90 Minutes Intravenous Every 12 hours 08/12/19 0854 08/15/19 1337   08/11/19 1400  sulfamethoxazole-trimethoprim (BACTRIM) 400-80 MG per tablet 3 tablet  Status:  Discontinued     3 tablet Oral Every 8 hours 08/11/19 1349 08/12/19 0854   08/10/19 1000  abacavir-dolutegravir-lamiVUDine (TRIUMEQ) 600-50-300 MG per tablet 1 tablet  Status:  Discontinued     1 tablet Oral Daily 08/09/19 1514 08/12/19 0848   08/10/19 1000  sulfamethoxazole-trimethoprim (BACTRIM) 160 mg in dextrose 5 % 250 mL IVPB  Status:  Discontinued     160 mg 260 mL/hr over 60 Minutes Intravenous Daily 08/09/19 1514 08/11/19 1324   08/09/19 1730  metroNIDAZOLE (FLAGYL) IVPB 500 mg     500 mg 100 mL/hr over 60 Minutes Intravenous Every 8 hours 08/09/19 1636 08/13/19 1848   08/08/19 1000  rifampin (RIFADIN) 60 mg/mL  oral suspension 600 mg  Status:  Discontinued     600 mg Per Tube Daily 08/07/19 1106 08/07/19 1319   08/08/19 1000  bictegravir-emtricitabine-tenofovir AF (BIKTARVY) 50-200-25 MG per tablet 1 tablet  Status:  Discontinued     1 tablet Oral Daily 08/07/19 1419 08/09/19 1514   08/07/19 1330  bictegravir-emtricitabine-tenofovir AF (BIKTARVY) 50-200-25 MG per tablet 1 tablet  Status:  Discontinued     1 tablet Oral Daily 08/07/19 1319 08/07/19 1419   08/07/19 1000  ethambutol (MYAMBUTOL) tablet 1,200 mg  Status:  Discontinued     1,200 mg Per Tube Daily 08/06/19 1423 08/07/19 1319   08/07/19 1000  isoniazid (NYDRAZID) tablet 300 mg  Status:  Discontinued     300 mg Per Tube Daily 08/06/19 1423 08/07/19 1319   08/07/19 1000  rifampin (RIFADIN) 60 mg/mL oral suspension 600 mg   Status:  Discontinued     600 mg Oral Daily 08/06/19 1423 08/07/19 1106   08/07/19 1000  sulfamethoxazole-trimethoprim (BACTRIM DS) 800-160 MG per tablet 1 tablet  Status:  Discontinued     1 tablet Per Tube Daily 08/06/19 1423 08/09/19 1514   08/07/19 1000  pyrazinamide tablet 1,500 mg  Status:  Discontinued     1,500 mg Per Tube Daily 08/06/19 1423 08/07/19 1319   08/03/19 1600  fluconazole (DIFLUCAN) IVPB 200 mg  Status:  Discontinued     200 mg 100 mL/hr over 60 Minutes Intravenous Every 24 hours 08/03/19 1548 08/13/19 1009   07/30/19 1700  rifampin (RIFADIN) capsule 600 mg  Status:  Discontinued     600 mg Oral Daily 07/30/19 1604 08/06/19 1423   07/30/19 1700  isoniazid (NYDRAZID) tablet 300 mg  Status:  Discontinued     300 mg Oral Daily 07/30/19 1604 08/06/19 1423   07/30/19 1700  pyrazinamide tablet 1,500 mg  Status:  Discontinued     1,500 mg Oral Daily 07/30/19 1604 08/06/19 1423   07/30/19 1700  ethambutol (MYAMBUTOL) tablet 1,200 mg  Status:  Discontinued     1,200 mg Oral Daily 07/30/19 1604 08/06/19 1423   07/29/19 1700  sulfamethoxazole-trimethoprim (BACTRIM DS) 800-160 MG per tablet 1 tablet  Status:  Discontinued     1 tablet Oral Daily 07/29/19 1601 08/06/19 1423   07/29/19 1700  ganciclovir (CYTOVENE) 285 mg in sodium chloride 0.9 % 100 mL IVPB  Status:  Discontinued     5 mg/kg  57 kg 100 mL/hr over 60 Minutes Intravenous Every 12 hours 07/29/19 1603 08/07/19 1526   07/29/19 1630  penicillin G potassium 12 Million Units in dextrose 5 % 250 mL IVPB  Status:  Discontinued     12 Million Units 250 mL/hr over 60 Minutes Intravenous Every 12 hours 07/29/19 1552 07/29/19 1559   07/29/19 1630  penicillin G potassium 12 Million Units in dextrose 5 % 500 mL continuous infusion     12 Million Units 41.7 mL/hr over 12 Hours Intravenous Every 12 hours 07/29/19 1559 08/12/19 2359       Subjective: Patient seen and examined at bedside.  Sleepy, hardly wakes up on calling his  name.  No overnight fever or vomiting reported by nursing staff.   Objective: Vitals:   09/01/19 2100 09/01/19 2340 09/02/19 0355 09/02/19 0500  BP: 106/75 90/71 (!) 84/57   Pulse: 73 75 66   Resp: 17 19 17    Temp: 97.8 F (36.6 C) 97.7 F (36.5 C) (!) 97.3 F (36.3 C)   TempSrc: Oral Oral Axillary  SpO2: 99% 95% 92%   Weight:    54.4 kg  Height:        Intake/Output Summary (Last 24 hours) at 09/02/2019 0722 Last data filed at 09/01/2019 1500 Gross per 24 hour  Intake 970 ml  Output -  Net 970 ml   Filed Weights   08/31/19 0500 09/01/19 0300 09/02/19 0500  Weight: 51.2 kg 50.4 kg 54.4 kg    Examination:  General exam: No distress.  Sleepy, hardly wakes up on calling his name.Beola Cord.  Cortrak in place Respiratory system: Bilateral decreased breath sounds at bases with some crackles  cardiovascular system: Rate controlled, S1-S2 heard Gastrointestinal system: Abdomen is nondistended, soft and nontender. Normal bowel sounds heard. Extremities: No cyanosis, edema   Data Reviewed: I have personally reviewed following labs and imaging studies  CBC: Recent Labs  Lab 08/27/19 0248 08/28/19 0205 08/30/19 0216 09/02/19 0209  WBC 1.8* 2.0* 2.1* 1.9*  NEUTROABS 0.8* 1.0* 1.2* 0.9*  HGB 10.9* 11.3* 10.6* 10.8*  HCT 34.3* 33.8* 31.5* 31.9*  MCV 96.3 92.6 91.0 91.1  PLT 199 240 253 246   Basic Metabolic Panel: Recent Labs  Lab 08/27/19 0248 08/28/19 0205 08/30/19 0216 09/02/19 0209  NA 139 138 132* 135  K 4.7 4.3 4.4 4.5  CL 111 109 105 106  CO2 19* 22 20* 24  GLUCOSE 103* 100* 108* 88  BUN 22* 20 19 16   CREATININE 0.80 0.93 0.84 0.74  CALCIUM 8.6* 8.8* 8.6* 8.8*  MG 1.9 2.0 1.8  --   PHOS 3.4 3.2  --   --    GFR: Estimated Creatinine Clearance: 98.2 mL/min (by C-G formula based on SCr of 0.74 mg/dL). Liver Function Tests: Recent Labs  Lab 08/27/19 0248 08/28/19 0205 08/30/19 0216 09/02/19 0209  AST  --   --  40 37  ALT  --   --  38 34  ALKPHOS  --   --   77 80  BILITOT  --   --  0.3 0.2*  PROT  --   --  7.0 7.2  ALBUMIN 2.6* 2.5* 2.5* 2.4*   No results for input(s): LIPASE, AMYLASE in the last 168 hours. No results for input(s): AMMONIA in the last 168 hours. Coagulation Profile: Recent Labs  Lab 09/02/19 0209  INR 1.0   Cardiac Enzymes: No results for input(s): CKTOTAL, CKMB, CKMBINDEX, TROPONINI in the last 168 hours. BNP (last 3 results) No results for input(s): PROBNP in the last 8760 hours. HbA1C: No results for input(s): HGBA1C in the last 72 hours. CBG: Recent Labs  Lab 09/01/19 1102 09/01/19 1716 09/01/19 2110 09/02/19 0011 09/02/19 0443  GLUCAP 99 125* 120* 124* 94   Lipid Profile: No results for input(s): CHOL, HDL, LDLCALC, TRIG, CHOLHDL, LDLDIRECT in the last 72 hours. Thyroid Function Tests: No results for input(s): TSH, T4TOTAL, FREET4, T3FREE, THYROIDAB in the last 72 hours. Anemia Panel: No results for input(s): VITAMINB12, FOLATE, FERRITIN, TIBC, IRON, RETICCTPCT in the last 72 hours. Sepsis Labs: No results for input(s): PROCALCITON, LATICACIDVEN in the last 168 hours.  No results found for this or any previous visit (from the past 240 hour(s)).       Radiology Studies: Ct Abdomen Wo Contrast  Result Date: 08/31/2019 CLINICAL DATA:  Evaluate anatomy for potential percutaneous gastrostomy tube placement. EXAM: CT ABDOMEN WITHOUT CONTRAST TECHNIQUE: Multidetector CT imaging of the abdomen was performed following the standard protocol without IV contrast. COMPARISON:  None. FINDINGS: Lack of intravenous contrast limits the ability to  evaluate solid abdominal organs. Lower chest: Limited visualization of the lower thorax demonstrates consolidative opacities within the image right lower lobe with associated air bronchograms (image 7, series 4). Ill-defined slight tree-in-bud nodular opacities are seen within the contralateral left lower lobe (image 8, series 4). No pleural effusion. Normal heart size. No  pericardial effusion. There is diffuse decreased attenuation of the intra cardiac blood pool as could be seen in the setting of anemia. Hepatobiliary: Normal hepatic contour. The gallbladder is underdistended. No ascites. Pancreas: Suboptimally evaluated on this noncontrast examination. Spleen: Normal noncontrast appearance of the spleen. Adrenals/Urinary Tract: Normal noncontrast appearance of the bilateral kidneys. No renal stones. No urine obstruction. Normal noncontrast appearance the bilateral adrenal glands. Urinary bladder was not imaged. Stomach/Bowel: The anterior wall of the stomach appears well apposed against the ventral abdominal wall without interposed hepatic parenchyma or transverse colon, likely suspected improvement in potential percutaneous access window with gastric insufflation. Enteric tube tip terminates within the proximal duodenum. Large colonic stool burden without evidence of enteric obstruction. No pneumoperitoneum, pneumatosis or portal venous gas. Vascular/Lymphatic: Normal caliber of the abdominal aorta. No bulky retroperitoneal or mesenteric adenopathy on this noncontrast examination. Other: Regional soft tissues appear normal. Musculoskeletal: No acute or aggressive osseous abnormalities. IMPRESSION: 1. Gastric anatomy amenable to potential percutaneous gastrostomy tube placement as indicated. 2. Consolidative opacities with associated air bronchograms within the image right lower lobe with scattered ill-defined tree-in-bud opacities within the imaged left, nonspecific though could be seen in the setting infection and/or aspiration. Electronically Signed   By: Simonne Come M.D.   On: 08/31/2019 12:17        Scheduled Meds: . dolutegravir  50 mg Oral Daily  . emtricitabine-tenofovir AF  1 tablet Oral Daily  . [START ON 09/03/2019] enoxaparin (LOVENOX) injection  40 mg Subcutaneous Q24H  . famotidine  20 mg Per Tube Daily  . feeding supplement (PRO-STAT SUGAR FREE 64)  30 mL  Per Tube Daily  . free water  100 mL Per Tube Q8H  . insulin aspart  0-9 Units Subcutaneous Q4H  . mouth rinse  15 mL Mouth Rinse BID  . multivitamin  15 mL Per Tube Daily  . Pyrimethamine-Leucovorin  50 mg Per Tube Daily  . sulfaDIAZINE  1,000 mg Per Tube Q6H  . sulfamethoxazole-trimethoprim  20 mL Per Tube 3 times weekly  . terbinafine   Topical Daily   Continuous Infusions: .  ceFAZolin (ANCEF) IV    . chlorproMAZINE (THORAZINE) IV 50 mL/hr at 09/01/19 1500  . feeding supplement (OSMOLITE 1.5 CAL) 1,000 mL (09/01/19 1603)  . fluconazole (DIFLUCAN) IV 100 mL/hr at 09/01/19 1500          Glade Lloyd, MD [facility] 09/02/2019, 7:22 AM

## 2019-09-02 NOTE — Progress Notes (Signed)
Occupational Therapy Treatment Patient Details Name: Arthur [name] MRN: [id number] DOB: [birth year] Today's Date: 09/02/2019    History of present illness Pt is a 38 y.o. male admitted 07/28/19 with c/o worsening dizziness, headaches; has not taken HIV medications for ~1 yr due to afforability issues. Brain MRI consistent with opportunistic infection throughout both cerebral hemispheres. Pt also with lost R eye vision. Lumbar puncture 11/3. 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 Transfer to ICU 11/15 with worsening mental and respiratory status. PMH includes untreated HIV, depression, substance abuse.   OT comments  Patient supine in bed and agreeable to OT.  OT initiated bed level self care activities, completing suction oral care after setup with min assist for thoroughness using R UE (but pt able to sequence task with supervision), donning socks using figure 4 technique with mod assist (given increased time).  PT present for bed mobility, and pt able to complete EOB sitting with min assist to supervision self correcting to midline with multimodal cueing.  He continues to fatigue easily, but completed transfers with min assist +2 using RW today.  Motivated by music, and eager to participation today with increased verbalizations! Continue to recommend CIR.     Follow Up Recommendations  CIR;Supervision/Assistance - 24 hour    Equipment Recommendations  3 in 1 bedside commode    Recommendations for Other Services      Precautions / Restrictions Precautions Precautions: Fall Precaution Comments: Cortrak, soft BP Restrictions Weight Bearing Restrictions: No       Mobility Bed Mobility Overal bed mobility: Needs Assistance             General bed mobility comments: see PT note, completed with PT   Transfers Overall transfer level: Needs assistance Equipment used: Rolling walker (2 wheeled) Transfers: Sit to/from Stand Sit to Stand: Min  assist;+2 physical assistance;+2 safety/equipment         General transfer comment: min assist +2 to power up and steady from EOB     Balance Overall balance assessment: Needs assistance Sitting-balance support: Feet supported;Bilateral upper extremity supported Sitting balance-Leahy Scale: Fair Sitting balance - Comments: min guard/min assist for safety, but able to self correct to midline with mulitmodal cueing; at times close supervision    Standing balance support: Bilateral upper extremity supported;During functional activity Standing balance-Leahy Scale: Poor Standing balance comment: relaint on BUE and external support                           ADL either performed or assessed with clinical judgement   ADL Overall ADL's : Needs assistance/impaired     Grooming: Oral care;Sitting Grooming Details (indicate cue type and reason): seated in chair position in bed supported, using suction oral care with min assist for thoroughness with R UE              Lower Body Dressing: Maximal assistance;+2 for physical assistance Lower Body Dressing Details (indicate cue type and reason): bed level chair position to don socks with mod assist (requires thearpist to don over toes, then pt able to complete task using figure 4 technique) Toilet Transfer: Minimal assistance;+2 for physical assistance;+2 for safety/equipment;RW;Ambulation Toilet Transfer Details (indicate cue type and reason): simulated to recliner          Functional mobility during ADLs: Minimal assistance;+2 for physical assistance;+2 for safety/equipment;Rolling walker;Cueing for safety;Cueing for sequencing       Vision  Perception     Praxis      Cognition Arousal/Alertness: Awake/alert Behavior During Therapy: WFL for tasks assessed/performed Overall Cognitive Status: Impaired/Different from baseline Area of Impairment: Following commands;Problem solving;Awareness;Attention                    Current Attention Level: Selective   Following Commands: Follows one step commands consistently;Follows one step commands with increased time;Follows multi-step commands inconsistently   Awareness: Emergent Problem Solving: Slow processing;Requires verbal cues;Requires tactile cues;Decreased initiation;Difficulty sequencing General Comments: patient verbalizing more today, able to follow commands with decreased delay and improved attention to tasks         Exercises Exercises: Other exercises Other Exercises Other Exercises: Completed BUE exercises: AROM (to AAROM for full range) FF x 10 reps BUEs, squeeze washcloth (in ball shape) x 10 reps each UE    Shoulder Instructions       General Comments stable, but soft BP     Pertinent Vitals/ Pain       Pain Assessment: No/denies pain  Home Living                                          Prior Functioning/Environment              Frequency  Min 2X/week        Progress Toward Goals  OT Goals(current goals can now be found in the care plan section)  Progress towards OT goals: Progressing toward goals  Acute Rehab OT Goals Patient Stated Goal: If not CIR, home with family assist OT Goal Formulation: With patient  Plan Discharge plan remains appropriate;Frequency remains appropriate    Co-evaluation                 AM-PAC OT "6 Clicks" Daily Activity     Outcome Measure   Help from another person eating meals?: Total(NPO) Help from another person taking care of personal grooming?: A Little Help from another person toileting, which includes using toliet, bedpan, or urinal?: Total Help from another person bathing (including washing, rinsing, drying)?: A Lot Help from another person to put on and taking off regular upper body clothing?: A Lot Help from another person to put on and taking off regular lower body clothing?: Total 6 Click Score: 10    End of Session Equipment  Utilized During Treatment: Rolling walker;Gait belt  OT Visit Diagnosis: Muscle weakness (generalized) (M62.81);Other (comment)(blind R eye )   Activity Tolerance Patient tolerated treatment well   Patient Left in chair;with call bell/phone within reach;Other (comment)(PT in room)   Nurse Communication Mobility status        Time: 8338-2505 OT Time Calculation (min): 18 min  Charges: OT General Charges $OT Visit: 1 Visit OT Treatments $Self Care/Home Management : 8-22 mins  Delight Stare, OT [place] Pager [phone] Office [phone]    Delight Stare 09/02/2019, 12:06 PM

## 2019-09-02 NOTE — Progress Notes (Signed)
Physical Therapy Treatment Patient Details Name: Arthur [name] MRN: [id number] DOB: September 04, 1981 Today's Date: 09/02/2019    History of Present Illness Pt is a 38 y.o. male admitted 07/28/19 with c/o worsening dizziness, headaches; has not taken HIV medications for ~1 yr due to afforability issues. Brain MRI consistent with opportunistic infection throughout both cerebral hemispheres. Pt also with lost R eye vision. Lumbar puncture 11/3. 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 Transfer to ICU 11/15 with worsening mental and respiratory status. PMH includes untreated HIV, depression, substance abuse.    PT Comments    Patient progressing slowly towards PT goals. Tolerated gait training with mod A of 2 for sequencing, stabilization/placement of LLE, RW management, and overall balance. Pt noted to have narrow BoS, some scissoring and worsening trunk/hip flexion with fatigue and exertion needing to reset posture twice during gait training. Enjoyed rap music being played during session. Attempting to verbalize more today but not intelligible. Continue to recommend CIR. Will follow.   Follow Up Recommendations  CIR;Supervision/Assistance - 24 hour     Equipment Recommendations  Other (comment)(defer)    Recommendations for Other Services       Precautions / Restrictions Precautions Precautions: Fall Precaution Comments: Cortrak, soft BP Restrictions Weight Bearing Restrictions: No    Mobility  Bed Mobility Overal bed mobility: Needs Assistance         Sit to supine: Mod assist;HOB elevated   General bed mobility comments: Assist to bring LEs into bed to return to supine.  Transfers Overall transfer level: Needs assistance Equipment used: Rolling walker (2 wheeled) Transfers: Sit to/from Stand Sit to Stand: Min assist;+2 physical assistance;+2 safety/equipment Stand pivot transfers: Mod assist;+2 safety/equipment       General  transfer comment: min assist +2 to power up and steady from EOB x2, from chair x2. SPT chair to bed with Mod A of 2 for sequencing, RW management and balance.  Ambulation/Gait Ambulation/Gait assistance: Mod assist;+2 physical assistance;+2 safety/equipment Gait Distance (Feet): 12 Feet Assistive device: Rolling walker (2 wheeled) Gait Pattern/deviations: Step-to pattern;Decreased step length - right;Decreased step length - left;Narrow base of support;Step-through pattern;Trunk flexed Gait velocity: decreased   General Gait Details: Slow, unsteady gait with incoordination LLE, narrow boS, assist with knee stabilization/placement of LLE, RW management and cues for upright as pt tended to have icnreased hip/trunk flexion when fatigued or with exertion. VSS.   Stairs             Wheelchair Mobility    Modified Rankin (Stroke Patients Only)       Balance Overall balance assessment: Needs assistance Sitting-balance support: Feet supported;Bilateral upper extremity supported Sitting balance-Leahy Scale: Fair Sitting balance - Comments: min guard/min assist for safety, but able to self correct to midline with mulitmodal cueing; at times close supervision    Standing balance support: During functional activity;Bilateral upper extremity supported Standing balance-Leahy Scale: Poor Standing balance comment: relaint on BUE and external support, needs max cues to widen BoS and for upright posture, extension. Able to initiate but not sustain.                            Cognition Arousal/Alertness: Awake/alert Behavior During Therapy: WFL for tasks assessed/performed Overall Cognitive Status: Impaired/Different from baseline Area of Impairment: Following commands;Problem solving;Awareness;Attention                   Current Attention Level: Selective   Following Commands:  Follows one step commands consistently;Follows one step commands with increased time;Follows  multi-step commands inconsistently Safety/Judgement: Decreased awareness of safety Awareness: Emergent Problem Solving: Slow processing;Requires verbal cues;Requires tactile cues;Decreased initiation;Difficulty sequencing General Comments: Attempting to verbalize more today; follows commands with increased time. Improved attention to tasks. perks up with rap music.      Exercises General Exercises - Lower Extremity Short Arc Quad: Both;10 reps;Supine Hip ABduction/ADduction: Both;15 reps;Seated Hip Flexion/Marching: Both;10 reps;Seated Heel Raises: Both;15 reps;Seated Other Exercises Other Exercises: Completed BUE exercises: AROM (to AAROM for full range) FF x 10 reps BUEs, squeeze washcloth (in ball shape) x 10 reps each UE     General Comments General comments (skin integrity, edema, etc.): VSS with soft BP but asymptomatic.      Pertinent Vitals/Pain Pain Assessment: Faces Faces Pain Scale: No hurt    Home Living                      Prior Function            PT Goals (current goals can now be found in the care plan section) Acute Rehab PT Goals Patient Stated Goal: If not CIR, home with family assist Progress towards PT goals: Progressing toward goals    Frequency    Min 3X/week      PT Plan Current plan remains appropriate    Co-evaluation              AM-PAC PT "6 Clicks" Mobility   Outcome Measure  Help needed turning from your back to your side while in a flat bed without using bedrails?: A Little Help needed moving from lying on your back to sitting on the side of a flat bed without using bedrails?: A Lot Help needed moving to and from a bed to a chair (including a wheelchair)?: A Lot Help needed standing up from a chair using your arms (e.g., wheelchair or bedside chair)?: A Lot Help needed to walk in hospital room?: A Lot Help needed climbing 3-5 steps with a railing? : Total 6 Click Score: 12    End of Session Equipment Utilized  During Treatment: Gait belt Activity Tolerance: Patient tolerated treatment well Patient left: in bed;with call bell/phone within reach;with bed alarm set Nurse Communication: Mobility status PT Visit Diagnosis: Other abnormalities of gait and mobility (R26.89);Muscle weakness (generalized) (M62.81);Unsteadiness on feet (R26.81)     Time: 3557-3220 PT Time Calculation (min) (ACUTE ONLY): 21 min  Charges:  $Gait Training: 8-22 mins                     Vale Haven, PT, DPT Acute Rehabilitation Services Pager [phone] Office [phone]       Blake Divine A Lanier Ensign 09/02/2019, 12:38 PM

## 2019-09-03 LAB — GLUCOSE, CAPILLARY
Glucose-Capillary: 62 mg/dL — ABNORMAL LOW (ref 70–99)
Glucose-Capillary: 151 mg/dL — ABNORMAL HIGH (ref 70–99)
Glucose-Capillary: 64 mg/dL — ABNORMAL LOW (ref 70–99)
Glucose-Capillary: 120 mg/dL — ABNORMAL HIGH (ref 70–99)
Glucose-Capillary: 57 mg/dL — ABNORMAL LOW (ref 70–99)
Glucose-Capillary: 108 mg/dL — ABNORMAL HIGH (ref 70–99)
Glucose-Capillary: 72 mg/dL (ref 70–99)

## 2019-09-03 MED ORDER — DEXTROSE 50 % IV SOLN
INTRAVENOUS | Status: AC
  Filled 2019-09-03: qty 50

## 2019-09-03 MED ORDER — DEXTROSE 50 % IV SOLN
INTRAVENOUS | Status: AC
  Administered 2019-09-03: 25 mL
  Filled 2019-09-03: qty 50

## 2019-09-03 MED ORDER — DEXTROSE 50 % IV SOLN
INTRAVENOUS | Status: AC
  Administered 2019-09-03: 50 mL
  Filled 2019-09-03: qty 50

## 2019-09-03 NOTE — Plan of Care (Signed)
  Problem: Education: Goal: Knowledge of General Education information will improve Description: Including pain rating scale, medication(s)/side effects and non-pharmacologic comfort measures Outcome: Progressing   Problem: Nutrition: Goal: Adequate nutrition will be maintained Outcome: Progressing   

## 2019-09-03 NOTE — Progress Notes (Signed)
Inpatient Rehabilitation-Admissions Coordinator   Noted pt continues to have improved endurance and tolerance with therapies. Feel he is appropriate for CIR. I have spoken to his mom who plans to meet with me this afternoon to continue to discuss CIR program and determine if he is a candidate.   Please call if questions.   Jhonnie Garner, OTR/L  Rehab Admissions Coordinator  [phone] 09/03/2019 11:11 AM

## 2019-09-03 NOTE — Progress Notes (Addendum)
Arthur [name], male   DOB: 07-23-1981, 38 y.o.   MRN: [id number]  PROGRESS NOTE    Arthur [name]  FAO:[id number] DOB: 1981/04/26 DOA: 07/28/2019 PCP: Randall Hiss, MD   Brief Narrative:  38 year old male with history of HIV, substance abuse, depression and GERD presented on 07/28/2019 with dizziness, weakness and intermittent headache with noncompliance to his HIV medications for about 1 year due to affordability issue.  MRI of the brain in the ED showed widespread areas of abnormal low level restricted diffusion and postcontrast enhancement throughout both cerebral hemispheres, predominantly infratentorial with the dominant abnormality in the left anterior frontal white matter consistent with an opportunistic infection, such as toxoplasmosis, tuberculosis or fungal cerebritis/meningitis, or parasitic infection.  He was admitted; ID was consulted.  He then revealed that he had lost his vision in his right eye.  He was started on penicillin and ganciclovir for possible CMV retinitis versus synovitis.  Ophthalmology was consulted.  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  Assessment & Plan:   CNS toxoplasmosis AIDS/untreated HIV Oral thrush -Currently on pyrimethamine-leucovorin, sulfadiazine.  Continue induction phase treatment for total of 6 weeks, then switch to chronic phase as per ID.  ID signed off on 08/30/2019 and recommend reconsulting ID as patient gets closer to discharge. -Continue tivicay plus Descovy -Continue high-dose Diflucan for 7 days till 09/04/2019. -Continue Bactrim for prophylaxis -CSF was positive for VDRL.  Patient has completed penicillin treatment -Positive for CMV: Initially treated with ganciclovir which has subsequently been discontinued with negative CNS PCR -TB Gold plus negative.  MTB probe negative.  RIPE treatment stopped  Acute toxic/metabolic encephalopathy -Multifactorial including CNS infection and SIADH -Repeat  CT head without new process, evolving lesions of cerebellum and left frontal lobe -MRI brain with multifocal signal abnormality and abnormal contrast-enhancement, most consistent with infectious cerebritis, consistent with cerebral toxoplasmosis, no discrete abscess -Monitor mental status.  Right eye blindness, optic retinitis/neuritis -Patient has been evaluated by ophthalmology.  He received steroid injection on 08/02/2019 -Repeat MRI brain and orbits show improvement of brain lesions and optic neuritis, retinitis  Anemia of chronic disease -Hemoglobin stable. will monitor  Leukopenia-probably from AIDS.  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  Was treated with 3% saline and IV Lasix.  Nephrology consulted and subsequently signed off on 08/16/2019 -Resolved.  Sodium stable.  Acute kidney injury -Resolved.  Stable  Dysphagia Severe protein calorie malnutrition/AIDS wasting syndrome -Tube feeding managed by dietitian.   -SLP following and is still recommending n.p.o. -Status post PEG tube placement by IR on 09/02/2019.  Will request dietitian to initiate PEG tube feedings.  DVT prophylaxis: Lovenox Code Status: Full Family Communication: Spoke to mother on phone on 09/03/2019. disposition Plan: Depends on clinical outcome  Consultants: ID/palliative care/ophthalmology/critical care/nephrology  Procedures: LP  Antimicrobials:  Anti-infectives (From admission, onward)   Start     Dose/Rate Route Frequency Ordered Stop   09/01/19 1500  ceFAZolin (ANCEF) IVPB 2g/100 mL premix     2 g 200 mL/hr over 30 Minutes Intravenous To Radiology 09/01/19 0958 09/02/19 1415   08/26/19 1600  dolutegravir (TIVICAY) tablet 50 mg     50 mg Oral Daily 08/26/19 1426     08/26/19 1600  emtricitabine-tenofovir AF (DESCOVY) 200-25 MG per tablet 1 tablet     1 tablet Oral Daily 08/26/19 1426     08/23/19 0900  sulfamethoxazole-trimethoprim (BACTRIM) 200-40 MG/5ML  suspension 20 mL  Status:  Discontinued  20 mL Oral 3 times weekly 08/21/19 0950 08/21/19 1311   08/23/19 0900  sulfamethoxazole-trimethoprim (BACTRIM) 200-40 MG/5ML suspension 20 mL     20 mL Per Tube 3 times weekly 08/21/19 1311     08/21/19 2000  Pyrimethamine-Leucovorin 50-25 MG CAPS 50 mg     50 mg Per Tube Daily 08/21/19 1312     08/21/19 1100  fluconazole (DIFLUCAN) IVPB 400 mg     400 mg 100 mL/hr over 120 Minutes Intravenous Every 24 hours 08/21/19 1009 09/04/19 2359   08/20/19 1200  sulfaDIAZINE tablet 1,000 mg     1,000 mg Per Tube Every 6 hours 08/20/19 1118     08/19/19 1015  sulfamethoxazole-trimethoprim (BACTRIM DS) 800-160 MG per tablet 1 tablet  Status:  Discontinued     1 tablet Oral 3 times weekly 08/19/19 1002 08/21/19 0950   08/16/19 2000  Pyrimethamine-Leucovorin 50-25 MG CAPS 50 mg  Status:  Discontinued     50 mg Oral Daily 08/15/19 1337 08/15/19 1357   08/16/19 2000  Pyrimethamine-Leucovorin 50-25 MG CAPS 50 mg  Status:  Discontinued     50 mg Oral Daily 08/15/19 1357 08/21/19 1312   08/15/19 2000  Pyrimethamine-Leucovorin 50-25 MG CAPS 200 mg  Status:  Discontinued     200 mg Oral  Once 08/15/19 1337 08/15/19 1357   08/15/19 2000  Pyrimethamine-Leucovorin 50-25 MG CAPS 200 mg     200 mg Oral  Once 08/15/19 1357 08/15/19 2114   08/15/19 1800  sulfaDIAZINE tablet 1,000 mg  Status:  Discontinued     1,000 mg Oral Every 6 hours 08/15/19 1337 08/20/19 1118   08/14/19 1000  demeclocycline (DECLOMYCIN) tablet 150 mg  Status:  Discontinued     150 mg Oral Every 12 hours 08/14/19 0848 08/14/19 0907   08/14/19 1000  demeclocycline (DECLOMYCIN) tablet 150 mg  Status:  Discontinued     150 mg Per Tube Every 12 hours 08/14/19 0907 08/19/19 1030   08/14/19 1000  fluconazole (DIFLUCAN) tablet 200 mg     200 mg Per Tube Daily 08/14/19 0907 08/18/19 1055   08/13/19 1015  fluconazole (DIFLUCAN) tablet 200 mg  Status:  Discontinued     200 mg Oral Daily 08/13/19 1009 08/14/19  0907   08/13/19 1000  penicillin g benzathine (BICILLIN LA) 1200000 UNIT/2ML injection 2.4 Million Units     2.4 Million Units Intramuscular  Once 08/08/19 1019 08/13/19 1222   08/12/19 1400  sulfamethoxazole-trimethoprim (BACTRIM) 272.48 mg in dextrose 5 % 500 mL IVPB  Status:  Discontinued     10 mg/kg/day  54.5 kg 344.7 mL/hr over 90 Minutes Intravenous Every 12 hours 08/12/19 0854 08/15/19 1337   08/11/19 1400  sulfamethoxazole-trimethoprim (BACTRIM) 400-80 MG per tablet 3 tablet  Status:  Discontinued     3 tablet Oral Every 8 hours 08/11/19 1349 08/12/19 0854   08/10/19 1000  abacavir-dolutegravir-lamiVUDine (TRIUMEQ) 600-50-300 MG per tablet 1 tablet  Status:  Discontinued     1 tablet Oral Daily 08/09/19 1514 08/12/19 0848   08/10/19 1000  sulfamethoxazole-trimethoprim (BACTRIM) 160 mg in dextrose 5 % 250 mL IVPB  Status:  Discontinued     160 mg 260 mL/hr over 60 Minutes Intravenous Daily 08/09/19 1514 08/11/19 1324   08/09/19 1730  metroNIDAZOLE (FLAGYL) IVPB 500 mg     500 mg 100 mL/hr over 60 Minutes Intravenous Every 8 hours 08/09/19 1636 08/13/19 1848   08/08/19 1000  rifampin (RIFADIN) 60 mg/mL oral suspension 600 mg  Status:  Discontinued     600 mg Per Tube Daily 08/07/19 1106 08/07/19 1319   08/08/19 1000  bictegravir-emtricitabine-tenofovir AF (BIKTARVY) 50-200-25 MG per tablet 1 tablet  Status:  Discontinued     1 tablet Oral Daily 08/07/19 1419 08/09/19 1514   08/07/19 1330  bictegravir-emtricitabine-tenofovir AF (BIKTARVY) 50-200-25 MG per tablet 1 tablet  Status:  Discontinued     1 tablet Oral Daily 08/07/19 1319 08/07/19 1419   08/07/19 1000  ethambutol (MYAMBUTOL) tablet 1,200 mg  Status:  Discontinued     1,200 mg Per Tube Daily 08/06/19 1423 08/07/19 1319   08/07/19 1000  isoniazid (NYDRAZID) tablet 300 mg  Status:  Discontinued     300 mg Per Tube Daily 08/06/19 1423 08/07/19 1319   08/07/19 1000  rifampin (RIFADIN) 60 mg/mL oral suspension 600 mg  Status:   Discontinued     600 mg Oral Daily 08/06/19 1423 08/07/19 1106   08/07/19 1000  sulfamethoxazole-trimethoprim (BACTRIM DS) 800-160 MG per tablet 1 tablet  Status:  Discontinued     1 tablet Per Tube Daily 08/06/19 1423 08/09/19 1514   08/07/19 1000  pyrazinamide tablet 1,500 mg  Status:  Discontinued     1,500 mg Per Tube Daily 08/06/19 1423 08/07/19 1319   08/03/19 1600  fluconazole (DIFLUCAN) IVPB 200 mg  Status:  Discontinued     200 mg 100 mL/hr over 60 Minutes Intravenous Every 24 hours 08/03/19 1548 08/13/19 1009   07/30/19 1700  rifampin (RIFADIN) capsule 600 mg  Status:  Discontinued     600 mg Oral Daily 07/30/19 1604 08/06/19 1423   07/30/19 1700  isoniazid (NYDRAZID) tablet 300 mg  Status:  Discontinued     300 mg Oral Daily 07/30/19 1604 08/06/19 1423   07/30/19 1700  pyrazinamide tablet 1,500 mg  Status:  Discontinued     1,500 mg Oral Daily 07/30/19 1604 08/06/19 1423   07/30/19 1700  ethambutol (MYAMBUTOL) tablet 1,200 mg  Status:  Discontinued     1,200 mg Oral Daily 07/30/19 1604 08/06/19 1423   07/29/19 1700  sulfamethoxazole-trimethoprim (BACTRIM DS) 800-160 MG per tablet 1 tablet  Status:  Discontinued     1 tablet Oral Daily 07/29/19 1601 08/06/19 1423   07/29/19 1700  ganciclovir (CYTOVENE) 285 mg in sodium chloride 0.9 % 100 mL IVPB  Status:  Discontinued     5 mg/kg  57 kg 100 mL/hr over 60 Minutes Intravenous Every 12 hours 07/29/19 1603 08/07/19 1526   07/29/19 1630  penicillin G potassium 12 Million Units in dextrose 5 % 250 mL IVPB  Status:  Discontinued     12 Million Units 250 mL/hr over 60 Minutes Intravenous Every 12 hours 07/29/19 1552 07/29/19 1559   07/29/19 1630  penicillin G potassium 12 Million Units in dextrose 5 % 500 mL continuous infusion     12 Million Units 41.7 mL/hr over 12 Hours Intravenous Every 12 hours 07/29/19 1559 08/12/19 2359       Subjective: Patient seen and examined at bedside.  No overnight fever or vomiting reported by  nursing staff.  Poor historian.  Awake, nods his head to some questions.  Objective: Vitals:   09/02/19 1511 09/02/19 1852 09/02/19 2010 09/03/19 0441  BP: (!) 86/51 90/63 93/69  (!) 85/60  Pulse: 73 68 62 (!) 58  Resp: 16  14 14   Temp: 97.9 F (36.6 C) 98.2 F (36.8 C) 98.2 F (36.8 C) (!) 97.4 F (36.3 C)  TempSrc: Oral Oral Oral Oral  SpO2: 97% 92% 94% 98%  Weight:    49.8 kg  Height:        Intake/Output Summary (Last 24 hours) at 09/03/2019 0749 Last data filed at 09/03/2019 0540 Gross per 24 hour  Intake 0 ml  Output 700 ml  Net -700 ml   Filed Weights   09/01/19 0300 09/02/19 0500 09/03/19 0441  Weight: 50.4 kg 54.4 kg 49.8 kg    Examination:  General exam: No acute distress.  Awake, nods his head to some questions.  Cortrak in place.  Poor historian Respiratory system: Bilateral decreased breath sounds at bases with scattered crackles  cardiovascular system: S1-S2 heard, rate controlled Gastrointestinal system: Abdomen is nondistended, soft and mildly tender around PEG tube site. Normal bowel sounds heard.  PEG tube present Extremities: No cyanosis, edema   Data Reviewed: I have personally reviewed following labs and imaging studies  CBC: Recent Labs  Lab 08/28/19 0205 08/30/19 0216 09/02/19 0209  WBC 2.0* 2.1* 1.9*  NEUTROABS 1.0* 1.2* 0.9*  HGB 11.3* 10.6* 10.8*  HCT 33.8* 31.5* 31.9*  MCV 92.6 91.0 91.1  PLT 240 253 246   Basic Metabolic Panel: Recent Labs  Lab 08/28/19 0205 08/30/19 0216 09/02/19 0209  NA 138 132* 135  K 4.3 4.4 4.5  CL 109 105 106  CO2 22 20* 24  GLUCOSE 100* 108* 88  BUN 20 19 16   CREATININE 0.93 0.84 0.74  CALCIUM 8.8* 8.6* 8.8*  MG 2.0 1.8  --   PHOS 3.2  --   --    GFR: Estimated Creatinine Clearance: 89.9 mL/min (by C-G formula based on SCr of 0.74 mg/dL). Liver Function Tests: Recent Labs  Lab 08/28/19 0205 08/30/19 0216 09/02/19 0209  AST  --  40 37  ALT  --  38 34  ALKPHOS  --  77 80  BILITOT  --  0.3  0.2*  PROT  --  7.0 7.2  ALBUMIN 2.5* 2.5* 2.4*   No results for input(s): LIPASE, AMYLASE in the last 168 hours. No results for input(s): AMMONIA in the last 168 hours. Coagulation Profile: Recent Labs  Lab 09/02/19 0209  INR 1.0   Cardiac Enzymes: No results for input(s): CKTOTAL, CKMB, CKMBINDEX, TROPONINI in the last 168 hours. BNP (last 3 results) No results for input(s): PROBNP in the last 8760 hours. HbA1C: No results for input(s): HGBA1C in the last 72 hours. CBG: Recent Labs  Lab 09/02/19 1109 09/02/19 2010 09/02/19 2352 09/03/19 0433 09/03/19 0737  GLUCAP 92 74 72 62* 151*   Lipid Profile: No results for input(s): CHOL, HDL, LDLCALC, TRIG, CHOLHDL, LDLDIRECT in the last 72 hours. Thyroid Function Tests: No results for input(s): TSH, T4TOTAL, FREET4, T3FREE, THYROIDAB in the last 72 hours. Anemia Panel: No results for input(s): VITAMINB12, FOLATE, FERRITIN, TIBC, IRON, RETICCTPCT in the last 72 hours. Sepsis Labs: No results for input(s): PROCALCITON, LATICACIDVEN in the last 168 hours.  No results found for this or any previous visit (from the past 240 hour(s)).       Radiology Studies: Ir Gastrostomy Tube Mod Sed  Result Date: 09/02/2019 INDICATION: 38 year old male with dysphagia EXAM: PERC PLACEMENT GASTROSTOMY MEDICATIONS: 2 g Ancef; Antibiotics were administered within 1 hour of the procedure. Scratch ANESTHESIA/SEDATION: Versed 0.5 mg IV; Fentanyl 25 mcg IV Moderate Sedation Time:  10 minutes The patient was continuously monitored during the procedure by the interventional radiology nurse under my direct supervision. CONTRAST:  94mL OMNIPAQUE IOHEXOL 300 MG/ML SOLN - administered into the gastric  lumen. FLUOROSCOPY TIME:  Fluoroscopy Time: 1 minutes 6 seconds (6 mGy). COMPLICATIONS: None PROCEDURE: Informed written consent was obtained from the patient and the patient's family after a thorough discussion of the procedural risks, benefits and alternatives.  All questions were addressed. Maximal Sterile Barrier Technique was utilized including caps, mask, sterile gowns, sterile gloves, sterile drape, hand hygiene and skin antiseptic. A timeout was performed prior to the initiation of the procedure. The epigastrium was prepped with Betadine in a sterile fashion, and a sterile drape was applied covering the operative field. A sterile gown and sterile gloves were used for the procedure. A 5-French orogastric tube is placed under fluoroscopic guidance. Scout imaging of the abdomen confirms barium within the transverse colon. The stomach was distended with gas. Under fluoroscopic guidance, an 18 gauge needle was utilized to puncture the anterior wall of the body of the stomach. An Amplatz wire was advanced through the needle passing a T fastener into the lumen of the stomach. The T fastener was secured for gastropexy. A 9-French sheath was inserted. A snare was advanced through the 9-French sheath. A Teena Dunk was advanced through the orogastric tube. It was snared then pulled out the oral cavity, pulling the snare, as well. The leading edge of the gastrostomy was attached to the snare. It was then pulled down the esophagus and out the percutaneous site. Tube secured in place. Contrast was injected. Patient tolerated the procedure well and remained hemodynamically stable throughout. No complications were encountered and no significant blood loss encountered. IMPRESSION: Status post fluoroscopic placed percutaneous gastrostomy tube, with 20 [place] pull-through. Signed, Yvone Neu. Loreta Ave, DO [facility] Radiology Electronically Signed   By: Gilmer Mor D.O.   On: 09/02/2019 16:56        Scheduled Meds: . dextrose      . dolutegravir  50 mg Oral Daily  . emtricitabine-tenofovir AF  1 tablet Oral Daily  . enoxaparin (LOVENOX) injection  40 mg Subcutaneous Q24H  . famotidine  20 mg Per Tube Daily  . feeding supplement  (PRO-STAT SUGAR FREE 64)  30 mL Per Tube Daily  . free water  100 mL Per Tube Q8H  . insulin aspart  0-9 Units Subcutaneous Q4H  . mouth rinse  15 mL Mouth Rinse BID  . multivitamin  15 mL Per Tube Daily  . Pyrimethamine-Leucovorin  50 mg Per Tube Daily  . sulfaDIAZINE  1,000 mg Per Tube Q6H  . sulfamethoxazole-trimethoprim  20 mL Per Tube 3 times weekly  . terbinafine   Topical Daily   Continuous Infusions: . chlorproMAZINE (THORAZINE) IV 50 mL/hr at 09/01/19 1500  . feeding supplement (OSMOLITE 1.5 CAL) 1,000 mL (09/01/19 1603)  . fluconazole (DIFLUCAN) IV 400 mg (09/02/19 1106)          Glade Lloyd, MD [facility] 09/03/2019, 7:49 AM

## 2019-09-03 NOTE — Progress Notes (Signed)
Patient s/p 20 Fr pull through gastrostomy placement yesterday by Dr. Earleen Newport in IR seen today for site check.   Patient laying in bed, alert and nods to some questions. When asked if his abdomen is sore he nods his head yes. G tube insertion site clean, dry, dressed appropriately without leakage or active bleeding. Mild pain to palpation which is to be expected, no abdominal distention noted. G tube is not to suction on my exam. NG in place.  Ok to begin using G tube for feeds, medications and free water.  Please call IR with questions or concerns.   Candiss Norse, PA-C

## 2019-09-03 NOTE — TOC Progression Note (Signed)
Transition of Care [facility]) - Progression Note    Patient Details  Name: Arthur [name] MRN: [id number] Date of Birth: 1980-11-26  Transition of Care [facility]) CM/SW Contact  Bartholomew Crews, RN Phone Number: [phone] 09/03/2019, 2:06 PM  Clinical Narrative:    [facility] team following for transition needs. Disposition is CIR vs home with mom and HH. Noted patient is now medicaid pending. Received message that mom was wanting to discuss DME needs, however, CIR now in conversations with mom d/t progressing endurance. Will continue to follow.    Expected Discharge Plan: [place] Barriers to Discharge: Continued Medical Work up  Expected Discharge Plan and Services Expected Discharge Plan: [place] In-house Referral: NA   Post Acute Care Choice: [place] arrangements for the past 2 months: Single Family Home                           HH Arranged: PT, OT HH Agency: [facility] Date [place]: 07/30/19 Time [place]: 1442 Representative spoke with at [place]: [name]   Social Determinants of Health ([place]) Interventions    Readmission Risk Interventions No flowsheet data found.

## 2019-09-03 NOTE — PMR Pre-admission (Signed)
PMR Admission Coordinator Pre-Admission Assessment  Patient: Arthur [name] is an 38 y.o., male MRN: [id number] DOB: 02/12/1981 Height: (Could not obtain weight, pt on new bed that was not zeroed) Weight: 48.3 kg              Insurance Information HMO:     PPO:      PCP:      IPA:      80/20:     OTHER:  PRIMARY: uninsured (self pay)      Policy#:       Subscriber:  CM Name:       Phone#:      Fax#:  Pre-Cert#:       Employer:  Benefits:  Phone #:      Name:  Eff. Date:      Deduct:       Out of Pocket Max:       Life Max:  CIR: pt/mother is aware of estimated daily cost of care ($3,500).       SNF:  Outpatient:      Co-Pay:  Home Health:       Co-Pay:  DME:      Co-Pay:  Providers:   Pt/mother has been in contact with financial counseling and MAD application has been initiated. They are aware that if he is not approved for Medicaid he will be responsible for the bill for CIR.   SECONDARY:  None     Policy#:       Subscriber:  CM Name:       Phone#:      Fax#:  Pre-Cert#:       Employer:  Benefits:  Phone #:      Name:  Eff. Date:      Deduct      Out of Pocket Max:       Life Max:  CIR:       SNF:  Outpatient:      Co-Pay:  Home Health:       Co-Pay:  DME:      Co-Pay:   Medicaid application on file. 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 He presented to the hospital on 07/28/2019 with dizziness, weakness, and intermittent headache. He also reported noncompliance to his HIV medications for approx 1 year due to financial issues. An MRI was completed which suggested an opportunistic infection, such as toxoplasmosis, tuberculosis or fungal cerebritis/meningitis, or parasitic infection. Pt was stated on penicillin and ganciclovir for possible CMV retinitis verses synovitis. ID has been following regarding his CNS toxoplasmosis. Related to his CNS infection has been acute toxic/metabolic encephalopathy, which has gradually improved. Pt's course has been complicated by anemia of chronic disease, leukopenia, which has most likely been linked to his AIDS, severe hyponatremia which has resolved since being treated with 3% saline and IV lasix, AKI, and dysphasia. Pt has had severe protein calorie malnutrition/AIDS wasting syndrome and  required PEG placement by IR on 09/02/2019 due to dysphagia. Pt has also been having hypogylcemia, bradycardia, and low BP-though he has not been symptomatic of his low BP.   Complete NIHSS TOTAL: 2 Glasgow Coma Scale Score: 15  Past Medical History  Past Medical History:  Diagnosis Date  . Anemia   . Anxiety   . Depression   . GERD (gastroesophageal reflux disease)   . HIV infection ([place])   . Substance abuse ([place])     Family History  family history is not on file.  Prior Rehab/Hospitalizations:  Has the patient had prior rehab or hospitalizations prior to admission? No  Has the patient had major surgery during 100 days prior to admission? Yes  Current Medications   Current Facility-Administered Medications:  .  acetaminophen (TYLENOL) tablet 650 mg, 650 mg, Oral, Q6H PRN, 650 mg at 09/02/19 1453 **OR** acetaminophen (TYLENOL) suppository 650 mg, 650 mg, Rectal, Q6H PRN, Doutova, Anastassia, MD .  chlorproMAZINE (THORAZINE) 12.5 mg in sodium chloride 0.9 % 25 mL IVPB, 12.5 mg, Intravenous, Q8H PRN,  Dellinger, Marianne L, PA-C, Last Rate: 50 mL/hr at 09/01/19 1500, Rate Verify at 09/01/19 1500 .  dolutegravir (TIVICAY) tablet 50 mg, 50 mg, Oral, Daily, Carlyle Basques, MD, 50 mg at 09/06/19 0842 .  emtricitabine-tenofovir AF (DESCOVY) 200-25 MG per tablet 1 tablet, 1 tablet, Oral, Daily, Carlyle Basques, MD, 1 tablet at 09/06/19 0843 .  enoxaparin (LOVENOX) injection 40 mg, 40 mg, Subcutaneous, Q24H, Allred, Darrell K, PA-C, 40 mg at 09/06/19 1046 .  famotidine (PEPCID) 40 MG/5ML suspension 20 mg, 20 mg, Per Tube, Daily, Dessa Phi, DO, 20 mg at 09/06/19 0845 .  feeding supplement (OSMOLITE 1.5 CAL) liquid 1,000 mL, 1,000 mL, Per Tube, Continuous, Kyle, Tyrone A, DO, Last Rate: 65 mL/hr at 09/06/19 0638, 1,000 mL at 09/06/19 0638 .  feeding supplement (PRO-STAT SUGAR FREE 64) liquid 30 mL, 30 mL, Per Tube, Daily, Kyle, Tyrone A, DO, 30 mL at 09/06/19 0844 .  free water 100 mL, 100 mL, Per Tube, Q8H, Dessa Phi, DO, 100 mL at 09/06/19 0642 .  guaiFENesin-dextromethorphan (ROBITUSSIN DM) 100-10 MG/5ML syrup 5 mL, 5 mL, Oral, Q4H PRN, Blount, Xenia T, NP, 5 mL at 08/18/19 1736 .  haloperidol lactate (HALDOL) injection 1 mg, 1 mg, Intravenous, Q6H PRN, Shahmehdi, Seyed A, MD, 1 mg at 08/27/19 1954 .  insulin aspart (novoLOG) injection 0-9 Units, 0-9 Units, Subcutaneous, Q4H, Dessa Phi, DO, 1 Units at 09/02/19 0059 .  ipratropium-albuterol (DUONEB) 0.5-2.5 (3) MG/3ML nebulizer solution 3 mL, 3 mL, Nebulization, Q4H PRN, Alekh, Kshitiz, MD .  loperamide HCl (IMODIUM) 1 MG/7.5ML suspension 2 mg, 2 mg, Per Tube, PRN, Starla Link, Kshitiz, MD, 2 mg at 09/01/19 1619 .  MEDLINE mouth rinse, 15 mL, Mouth Rinse, BID, Shahmehdi, Seyed A, MD, 15 mL at 09/06/19 1047 .  midodrine (PROAMATINE) tablet 2.5 mg, 2.5 mg, Oral, TID WC, Kyle, Tyrone A, DO, 2.5 mg at 09/06/19 0840 .  multivitamin liquid 15 mL, 15 mL, Per Tube, Daily, Alekh, Kshitiz, MD, 15 mL at 09/06/19 0844 .  ondansetron (ZOFRAN) tablet 4 mg, 4  mg, Oral, Q6H PRN **OR** ondansetron (ZOFRAN) injection 4 mg, 4 mg, Intravenous, Q6H PRN, Doutova, Anastassia, MD, 4 mg at 09/03/19 1635 .  phenol (CHLORASEPTIC) mouth spray 1 spray, 1 spray, Mouth/Throat, PRN, Ghimire, Kuber, MD .  Pyrimethamine-Leucovorin 50-25 MG CAPS 50 mg, 50 mg, Per Tube, Daily, Susa Raring, RPH, 50 mg at 09/05/19 2100 .  sulfaDIAZINE tablet 1,000  mg, 1,000 mg, Per Tube, Q6H, Kyle, Tyrone A, DO, 1,000 mg at 09/06/19 9604 .  sulfamethoxazole-trimethoprim (BACTRIM) 200-40 MG/5ML suspension 20 mL, 20 mL, Per Tube, 3 times weekly, Della Goo, RPH, 20 mL at 09/06/19 0844 .  terbinafine (LAMISIL) 1 % cream, , Topical, Daily, Noralee Stain, DO, Given at 09/06/19 1045 .  traMADol (ULTRAM) tablet 50 mg, 50 mg, Per Tube, Q6H PRN, Hanley Ben, Kshitiz, MD, 50 mg at 09/04/19 1951  Patients Current Diet:  Diet Order            Diet NPO time specified  Diet effective midnight              Precautions / Restrictions Precautions Precautions: Fall Precaution Comments: soft BP, Peg tube Restrictions Weight Bearing Restrictions: No   Has the patient had 2 or more falls or a fall with injury in the past year?No  Prior Activity Level Community (5-7x/wk): working at [employer], independent without AD use, did drive PTA  Prior Functional Level Prior Function Level of Independence: Independent  Self Care: Did the patient need help bathing, dressing, using the toilet or eating?  Independent  Indoor Mobility: Did the patient need assistance with walking from room to room (with or without device)? Independent  Stairs: Did the patient need assistance with internal or external stairs (with or without device)? Independent  Functional Cognition: Did the patient need help planning regular tasks such as shopping or remembering to take medications? Independent  Home Assistive Devices / Equipment Home Assistive Devices/Equipment: None Home Equipment: None  Prior Device  Use: Indicate devices/aids used by the patient prior to current illness, exacerbation or injury? None of the above  Current Functional Level Cognition  Overall Cognitive Status: Impaired/Different from baseline Difficult to assess due to: Impaired communication(pt encouraged to verbalize & was able to do so (short phrases) 3x during session) Current Attention Level: Selective Orientation Level: Oriented X4 Following Commands: Follows one step commands consistently, Follows one step commands with increased time, Follows multi-step commands inconsistently Safety/Judgement: Decreased awareness of safety General Comments: pt able to verbalize when prompted, soft volume    Extremity Assessment (includes Sensation/Coordination)  Upper Extremity Assessment: LUE deficits/detail LUE Deficits / Details: weakness, falls off L handle of walker during ambulation, can bring to mouth to wipe saliva LUE Coordination: decreased fine motor, decreased gross motor  Lower Extremity Assessment: Generalized weakness    ADLs  Overall ADL's : Needs assistance/impaired Eating/Feeding: NPO Grooming: Oral care, Sitting, Moderate assistance Grooming Details (indicate cue type and reason): difficulty spitting, used suction Upper Body Bathing: Moderate assistance, Bed level Upper Body Bathing Details (indicate cue type and reason): pt using R hand to wash chest and L underarm. pt using bil Ue to apply deordorant to each arm pit. pt total (A) for R arm and under arm. Pt with deep breath after task  Lower Body Bathing: Minimal assistance, Sit to/from stand Upper Body Dressing : Moderate assistance, Sitting Upper Body Dressing Details (indicate cue type and reason): to don front opening gown Lower Body Dressing: Total assistance, Sit to/from stand Lower Body Dressing Details (indicate cue type and reason): socks Toilet Transfer: Minimal assistance, +2 for physical assistance, +2 for safety/equipment, RW,  Ambulation Toilet Transfer Details (indicate cue type and reason): simulated to recliner  Toileting- Clothing Manipulation and Hygiene: Minimal assistance Functional mobility during ADLs: Minimal assistance, +2 for physical assistance, +2 for safety/equipment, Rolling walker, Cueing for safety, Cueing for sequencing General ADL Comments: pt limited by impaired  cognition, weakness, balance and activity tolerance    Mobility  Overal bed mobility: Needs Assistance Bed Mobility: Supine to Sit Rolling: Mod assist Sidelying to sit: Max assist Supine to sit: Min assist, HOB elevated(bed rails) Sit to supine: Mod assist, HOB elevated General bed mobility comments: max cuing & extra time with some assist to upright self vs R lateral lean    Transfers  Overall transfer level: Needs assistance Equipment used: Rolling walker (2 wheeled) Transfer via Lift Equipment: Stedy Transfers: Sit to/from Stand Sit to Stand: Min assist, +2 safety/equipment Stand pivot transfers: Mod assist, +2 safety/equipment General transfer comment: assist to place L hand on walker, min assist to rise and steady    Ambulation / Gait / Stairs / Wheelchair Mobility  Ambulation/Gait Ambulation/Gait assistance: +2 physical assistance, +2 safety/equipment, Min assist Gait Distance (Feet): 40 Feet Assistive device: Rolling walker (2 wheeled) Gait Pattern/deviations: Decreased step length - right, Decreased step length - left, Narrow base of support, Trunk flexed, Scissoring, Decreased stride length General Gait Details: intermittent scissoring gait, up to heavy assistance at times for managing RW & turning with AD Gait velocity: decreased Gait velocity interpretation: <1.31 ft/sec, indicative of household ambulator    Posture / Balance Dynamic Sitting Balance Sitting balance - Comments: R lateral lean in sitting, requires assistance to upright himself but then able to sit with min guard assist Balance Overall balance  assessment: Needs assistance Sitting-balance support: Feet supported, Bilateral upper extremity supported Sitting balance-Leahy Scale: Fair Sitting balance - Comments: R lateral lean in sitting, requires assistance to upright himself but then able to sit with min guard assist Postural control: Posterior lean, Left lateral lean Standing balance support: During functional activity, Bilateral upper extremity supported Standing balance-Leahy Scale: Poor Standing balance comment: BUE on RW and min assist during standing/gait    Special needs/care consideration BiPAP/CPAP: no CPM: no Continuous Drip IV: feeding supplement per tube.  Dialysis: no        Days: no Life Vest: no Oxygen: no Special Bed: no Trach Size: no Wound Vac (area): no      Location: no Skin: abrasion to left knee, cracking location to both feet, new gastrostomy tube -location: LUQ   Bowel mgmt:continent, last BM 09/02/2019 Bladder mgmt: incontinent Diabetic mgmt: no Behavioral consideration : refers to his mother to speak for him.  Chemo/radiation : no Designated visitor is Mom, [name]    Previous Home Environment  Living Arrangements: Other relatives Available Help at Discharge: Family, Available PRN/intermittently Type of Home: Apartment Home Layout: One level Home Access: Level entry Bathroom Shower/Tub: Armed forces training and education officer: Yes Home Care Services: No  Discharge Living Setting Plans for Discharge Living Setting: Patient's home, Lives with (comment), Apartment(sister) Type of Home at Discharge: Apartment Discharge Home Layout: Two level Alternate Level Stairs-Rails: Can reach both Alternate Level Stairs-Number of Steps: 12 Discharge Home Access: Stairs to enter Entrance Stairs-Rails: Can reach both Entrance Stairs-Number of Steps: 6 Discharge Bathroom Shower/Tub: Tub/shower unit Discharge Bathroom Toilet: Standard Discharge Bathroom Accessibility: Yes How Accessible: Accessible via  walker Does the patient have any problems obtaining your medications?: Yes (Describe)(has not been able to afford HIV medications)  Social/Family/Support Systems Patient Roles: Other (Comment)(employee at [employer], has supportive mother and sister) Contact Information: mother [name]): [phone]; sister Rozanna Box): [phone] Anticipated Caregiver: sister (during the day) as she works from home, mother to assist as fill in, and they have a sitter who can assist as well Anticipated Caregiver's Contact Information: see above Ability/Limitations of Caregiver:  Min/Mod A (sister is CNA) Caregiver Availability: 24/7 Discharge Plan Discussed with Primary Caregiver: Yes(sister and mother, and patient) Is Caregiver In Agreement with Plan?: Yes Does Caregiver/Family have Issues with Lodging/Transportation while Pt is in Rehab?: No   Goals/Additional Needs Patient/Family Goal for Rehab: PT: supervision; OT: Supervision/Min A; SLP: Min A Expected length of stay: 12-16 days Cultural Considerations: NA Dietary Needs: NPO Equipment Needs: TBD Special Service Needs: has cortrack and new PEG just placed 12/7;  Pt/Family Agrees to Admission and willing to participate: Yes Program Orientation Provided & Reviewed with Pt/Caregiver Including Roles  & Responsibilities: Yes(pt and mother)  Barriers to Discharge: Medical stability, Home environment access/layout, Incontinence, Insurance for SNF coverage  Barriers to Discharge Comments: medicaid pending. steps to enter home. will need to be 1 person assist for most of the day.    Decrease burden of Care through IP rehab admission: NA   Possible need for SNF placement upon discharge:Not anticipated; pt has great family support at DC from his mother, sister, other family members, and a family friend who is a Comptroller. Pt's sister is a CNA and his mother is very involved in his care. He will have 24/7 A at DC. If pt can progress to 1 person assist level he will  be able to transition home safely.    Patient Condition: This patient's medical and functional status has changed since the consult dated 11/27 in which the Rehabilitation Physician documented that the patient was not appropriate for intensive rehabilitative care in an inpatient rehabilitation facility. Due to change in status and issues being addressed, patient's case has been discussed with Dr. Riley Kill and patient now appropriate for inpatient rehabilitation. Pt has now been more consistent with his efforts with therapy, progressing to standing and ambulating, and is much more vocal and makes more attempts to guide his care. Will admit to inpatient rehab tomorrow 09/07/2019 when bed is available.  Preadmission Screen Completed By: Cheri Rous  With brief updates by Clois Dupes, RN, 09/06/2019 12:14 PM ______________________________________________________________________   Discussed status with Dr. Riley Kill on 09/06/2019 at 1215 pm and received approval for admission tomorrow 09/07/2019 when bed is available.  Admission Coordinator: Cheri Rous with brief updates by  Clois Dupes, time 1214 pm Date 09/06/2019.

## 2019-09-03 NOTE — Progress Notes (Signed)
Inpatient Rehabilitation-Admissions Coordinator   Met with pt and mom at the bedside this afternoon. Both are now in agreement for CIR. We reviewed cost of care, anticipated length of stay, assistance required at DC. I have confirmed DC support from the patient's mother and his sister. Anticipate possible admit to CIR tomorrow, pending medical readiness.   Jhonnie Garner, OTR/L  Rehab Admissions Coordinator  [phone] 09/03/2019 4:48 PM

## 2019-09-04 LAB — GLUCOSE, CAPILLARY
Glucose-Capillary: 157 mg/dL — ABNORMAL HIGH (ref 70–99)
Glucose-Capillary: 55 mg/dL — ABNORMAL LOW (ref 70–99)
Glucose-Capillary: 66 mg/dL — ABNORMAL LOW (ref 70–99)
Glucose-Capillary: 67 mg/dL — ABNORMAL LOW (ref 70–99)
Glucose-Capillary: 68 mg/dL — ABNORMAL LOW (ref 70–99)
Glucose-Capillary: 68 mg/dL — ABNORMAL LOW (ref 70–99)
Glucose-Capillary: 77 mg/dL (ref 70–99)
Glucose-Capillary: 80 mg/dL (ref 70–99)
Glucose-Capillary: 95 mg/dL (ref 70–99)
Glucose-Capillary: 97 mg/dL (ref 70–99)

## 2019-09-04 MED ORDER — DEXTROSE 50 % IV SOLN: 12.5000 g | INTRAVENOUS | Status: AC

## 2019-09-04 MED ORDER — DEXTROSE 50 % IV SOLN
INTRAVENOUS | Status: AC
  Administered 2019-09-04: 50 mL
  Filled 2019-09-04: qty 50

## 2019-09-04 MED ORDER — DEXTROSE 50 % IV SOLN
12.5000 g | INTRAVENOUS | Status: AC
  Administered 2019-09-04: 12.5 g via INTRAVENOUS

## 2019-09-04 MED ORDER — DEXTROSE 50 % IV SOLN
INTRAVENOUS | Status: AC
  Administered 2019-09-04: 25 mL
  Filled 2019-09-04: qty 50

## 2019-09-04 MED ORDER — OSMOLITE 1.5 CAL PO LIQD
1000.0000 mL | ORAL | Status: DC
  Administered 2019-09-04 – 2019-09-07 (×4): 1000 mL
  Filled 2019-09-04 (×7): qty 1000

## 2019-09-04 NOTE — Progress Notes (Signed)
Physical Therapy Treatment Patient Details Name: Arthur [name] MRN: [id number] DOB: 06-Jan-1981 Today's Date: 09/04/2019    History of Present Illness Pt is a 38 y.o. male admitted 07/28/19 with c/o worsening dizziness, headaches; has not taken HIV medications for ~1 yr due to afforability issues. Brain MRI consistent with opportunistic infection throughout both cerebral hemispheres. Pt also with lost R eye vision. Lumbar puncture 11/3. 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 Transfer to ICU 11/15 with worsening mental and respiratory status. PMH includes untreated HIV, depression, substance abuse.    PT Comments    Pt is making progress with functional mobility as he was able to ambulate up to 40 ft today with RW & min assist +2 with focus on gait pattern & balance during all functional mobility. Pt presents with a R gaze preference & head rotated to R during session but is able to scan to L upon command. Pt would benefit from ongoing skilled PT treatment to focus on transfers, bed mobility, and gait with LRAD & to increase safety awareness & independence with tasks. Pt will require 24 hr supervision/assist upon d/c.   Follow Up Recommendations  CIR;Supervision/Assistance - 24 hour     Equipment Recommendations  (TBD in next venue)    Recommendations for Other Services       Precautions / Restrictions Precautions Precautions: Fall Precaution Comments: soft BP, Peg tube Restrictions Weight Bearing Restrictions: No    Mobility  Bed Mobility Overal bed mobility: Needs Assistance Bed Mobility: Supine to Sit     Supine to sit: Min assist;HOB elevated(bed rails)     General bed mobility comments: max cuing & extra time with some assist to upright self vs R lateral lean  Transfers Overall transfer level: Needs assistance Equipment used: Rolling walker (2 wheeled) Transfers: Sit to/from Stand Sit to Stand: Min assist;+2 safety/equipment            Ambulation/Gait Ambulation/Gait assistance: +2 physical assistance;+2 safety/equipment;Min assist Gait Distance (Feet): 40 Feet Assistive device: Rolling walker (2 wheeled) Gait Pattern/deviations: Decreased step length - right;Decreased step length - left;Narrow base of support;Trunk flexed;Scissoring;Decreased stride length Gait velocity: decreased   General Gait Details: intermittent scissoring gait, up to heavy assistance at times for managing RW & turning with AD   Stairs             Wheelchair Mobility    Modified Rankin (Stroke Patients Only)       Balance Overall balance assessment: Needs assistance Sitting-balance support: Feet supported;Bilateral upper extremity supported Sitting balance-Leahy Scale: Fair Sitting balance - Comments: R lateral lean in sitting, requires assistance to upright himself but then able to sit with min guard assist   Standing balance support: During functional activity;Bilateral upper extremity supported Standing balance-Leahy Scale: Poor Standing balance comment: BUE on RW and min assist during standing/gait                            Cognition Arousal/Alertness: Awake/alert Behavior During Therapy: WFL for tasks assessed/performed Overall Cognitive Status: Impaired/Different from baseline Area of Impairment: Following commands;Problem solving;Awareness;Attention;Safety/judgement                       Following Commands: Follows one step commands consistently;Follows one step commands with increased time;Follows multi-step commands inconsistently Safety/Judgement: Decreased awareness of safety Awareness: Emergent Problem Solving: Slow processing;Requires verbal cues;Requires tactile cues;Decreased initiation;Difficulty sequencing General Comments: (P) pt able to  verbalize when prompted, soft volume      Exercises      General Comments        Pertinent Vitals/Pain Pain Assessment:  No/denies pain Faces Pain Scale: (P) No hurt    Home Living                      Prior Function            PT Goals (current goals can now be found in the care plan section) Acute Rehab PT Goals Patient Stated Goal: none stated today PT Goal Formulation: With patient Time For Goal Achievement: 09/11/19 Potential to Achieve Goals: Fair Progress towards PT goals: PT to reassess next treatment    Frequency    Min 3X/week      PT Plan Current plan remains appropriate    Co-evaluation PT/OT/SLP Co-Evaluation/Treatment: Yes Reason for Co-Treatment: For patient/therapist safety;To address functional/ADL transfers PT goals addressed during session: Mobility/safety with mobility;Balance;Proper use of DME OT goals addressed during session: (P) ADL's and self-care;Proper use of Adaptive equipment and DME      AM-PAC PT "6 Clicks" Mobility   Outcome Measure  Help needed turning from your back to your side while in a flat bed without using bedrails?: A Little Help needed moving from lying on your back to sitting on the side of a flat bed without using bedrails?: A Little Help needed moving to and from a bed to a chair (including a wheelchair)?: A Lot Help needed standing up from a chair using your arms (e.g., wheelchair or bedside chair)?: A Little Help needed to walk in hospital room?: A Lot Help needed climbing 3-5 steps with a railing? : Total 6 Click Score: 14    End of Session   Activity Tolerance: Patient tolerated treatment well Patient left: in chair;with call bell/phone within reach;with chair alarm set Nurse Communication: Mobility status PT Visit Diagnosis: Other abnormalities of gait and mobility (R26.89);Muscle weakness (generalized) (M62.81);Unsteadiness on feet (R26.81)     Time:  -     Charges:                            Sandi Mariscal, PT, DPT 09/04/2019, 1:35 PM

## 2019-09-04 NOTE — Progress Notes (Signed)
Occupational Therapy Treatment Patient Details Name: Arthur [name] MRN: [id number] DOB: 1980/12/25 Today's Date: 09/04/2019    History of present illness Pt is a 38 y.o. male admitted 07/28/19 with c/o worsening dizziness, headaches; has not taken HIV medications for ~1 yr due to afforability issues. Brain MRI consistent with opportunistic infection throughout both cerebral hemispheres. Pt also with lost R eye vision. Lumbar puncture 11/3. 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 Transfer to ICU 11/15 with worsening mental and respiratory status. PMH includes untreated HIV, depression, substance abuse.   OT comments  Pt progressing in bed mobility, sitting balance and ability to ambulate. Pt verbalizing when requested. Performed grooming and UB dressing with mod assist, total assist for LB dressing and pericare in standing. Continue to recommend intensive rehab in CIR.  Follow Up Recommendations  CIR;Supervision/Assistance - 24 hour    Equipment Recommendations  3 in 1 bedside commode    Recommendations for Other Services      Precautions / Restrictions Precautions Precautions: Fall Precaution Comments: soft BP, Peg tube Restrictions Weight Bearing Restrictions: No       Mobility Bed Mobility Overal bed mobility: Needs Assistance Bed Mobility: Supine to Sit     Supine to sit: Min assist;HOB elevated(bed rails)     General bed mobility comments: max cuing & extra time with some assist to upright self vs R lateral lean  Transfers Overall transfer level: Needs assistance Equipment used: Rolling walker (2 wheeled) Transfers: Sit to/from Stand Sit to Stand: Min assist;+2 safety/equipment         General transfer comment: assist to place L hand on walker, min assist to rise and steady    Balance Overall balance assessment: Needs assistance Sitting-balance support: Feet supported;Bilateral upper extremity supported Sitting  balance-Leahy Scale: Fair Sitting balance - Comments: R lateral lean in sitting, requires assistance to upright himself but then able to sit with min guard assist   Standing balance support: During functional activity;Bilateral upper extremity supported Standing balance-Leahy Scale: Poor Standing balance comment: BUE on RW and min assist during standing/gait                           ADL either performed or assessed with clinical judgement   ADL Overall ADL's : Needs assistance/impaired     Grooming: Oral care;Sitting;Moderate assistance Grooming Details (indicate cue type and reason): difficulty spitting, used suction         Upper Body Dressing : Moderate assistance;Sitting Upper Body Dressing Details (indicate cue type and reason): to don front opening gown Lower Body Dressing: Total assistance;Sit to/from stand Lower Body Dressing Details (indicate cue type and reason): socks             Functional mobility during ADLs: Minimal assistance;+2 for physical assistance;+2 for safety/equipment;Rolling walker;Cueing for safety;Cueing for sequencing       Vision       Perception     Praxis      Cognition Arousal/Alertness: Awake/alert Behavior During Therapy: WFL for tasks assessed/performed Overall Cognitive Status: Impaired/Different from baseline Area of Impairment: Following commands;Problem solving;Awareness;Attention;Safety/judgement                       Following Commands: Follows one step commands consistently;Follows one step commands with increased time;Follows multi-step commands inconsistently Safety/Judgement: Decreased awareness of safety Awareness: Emergent Problem Solving: Slow processing;Requires verbal cues;Requires tactile cues;Decreased initiation;Difficulty sequencing General Comments: pt able to verbalize  when prompted, soft volume        Exercises     Shoulder Instructions       General Comments      Pertinent  Vitals/ Pain       Pain Assessment: No/denies pain Faces Pain Scale: No hurt  Home Living                                          Prior Functioning/Environment              Frequency  Min 2X/week        Progress Toward Goals  OT Goals(current goals can now be found in the care plan section)  Progress towards OT goals: Progressing toward goals  Acute Rehab OT Goals Patient Stated Goal: none stated today OT Goal Formulation: With patient Time For Goal Achievement: 09/18/19 Potential to Achieve Goals: Good  Plan Discharge plan remains appropriate;Frequency remains appropriate    Co-evaluation    PT/OT/SLP Co-Evaluation/Treatment: Yes Reason for Co-Treatment: For patient/therapist safety;To address functional/ADL transfers PT goals addressed during session: Mobility/safety with mobility;Balance;Proper use of DME OT goals addressed during session: ADL's and self-care;Proper use of Adaptive equipment and DME      AM-PAC OT "6 Clicks" Daily Activity     Outcome Measure   Help from another person eating meals?: Total Help from another person taking care of personal grooming?: A Little Help from another person toileting, which includes using toliet, bedpan, or urinal?: Total Help from another person bathing (including washing, rinsing, drying)?: A Lot Help from another person to put on and taking off regular upper body clothing?: A Lot Help from another person to put on and taking off regular lower body clothing?: Total 6 Click Score: 10    End of Session    OT Visit Diagnosis: Muscle weakness (generalized) (M62.81);Other (comment);Other abnormalities of gait and mobility (R26.89);Unsteadiness on feet (R26.81)(impaired vision in R eye)   Activity Tolerance Patient tolerated treatment well   Patient Left in chair;with call bell/phone within reach;with chair alarm set   Nurse Communication Mobility status;Other (comment)(needs condom cath  replaced)        Time: 4196-2229 OT Time Calculation (min): 23 min  Charges: OT General Charges $OT Visit: 1 Visit OT Treatments $Self Care/Home Management : 8-22 mins  Martie Round, OTR/L Acute Rehabilitation Services Pager: [phone] Office: [phone]   Evern Bio 09/04/2019, 1:35 PM

## 2019-09-04 NOTE — Progress Notes (Signed)
Hypoglycemic Event  CBG: 67  Treatment: D50 25 mL (12.5 gm)  Symptoms: None  Follow-up CBG: Time:20:30 CBG Result:157  Possible Reasons for Event: Inadequate meal intake  Comments/MD notified:Per hypoglycemia protocol    Valine Drozdowski Joselita

## 2019-09-04 NOTE — Progress Notes (Signed)
    [facility] for Infectious Disease  Date of Admission:  07/28/2019            ID BRIEF PROGRESS NOTE:  Received notification today that Arthur [name] will be transitioning to CIR. He will need to continue current dose of pyrimethamine-leucovorin, sulfadiazine, Bactrim, and Tivicay and Descovy indefinitely. His initial treatment course is scheduled to end on 09/26/19 and will continue with maintenance at that point. Spoke with ID pharmacy and medications have been ordered for duration of hospitalization and rehabilitation. Please notify ID pharmacy ~ 1 week prior to discharge from CIR to coordinate set up of home medications. He has follow up in the ID office already scheduled on 09/30/19 with Dr. Tommy Medal.   Principal Problem:   Toxoplasmosis Active Problems:   Neurosyphilis   Acquired immunodeficiency syndrome ([place])   Nausea and vomiting   Bradycardia   Hyponatremia   Vision loss   Abnormal brain MRI   Palliative care encounter   Thrush of mouth and esophagus ([place])   Goals of care, counseling/discussion   Protein-calorie malnutrition, severe   Weakness generalized   Dysphagia   Aspiration into airway   Palliative care by specialist   DNR (do not resuscitate) discussion   . dolutegravir  50 mg Oral Daily  . emtricitabine-tenofovir AF  1 tablet Oral Daily  . enoxaparin (LOVENOX) injection  40 mg Subcutaneous Q24H  . famotidine  20 mg Per Tube Daily  . feeding supplement (PRO-STAT SUGAR FREE 64)  30 mL Per Tube Daily  . free water  100 mL Per Tube Q8H  . insulin aspart  0-9 Units Subcutaneous Q4H  . mouth rinse  15 mL Mouth Rinse BID  . multivitamin  15 mL Per Tube Daily  . Pyrimethamine-Leucovorin  50 mg Per Tube Daily  . sulfaDIAZINE  1,000 mg Per Tube Q6H  . sulfamethoxazole-trimethoprim  20 mL Per Tube 3 times weekly  . terbinafine   Topical Daily    Terri Piedra, NP [facility] Pager  09/04/2019   3:30 PM

## 2019-09-04 NOTE — Progress Notes (Signed)
Arthur [name]  PROGRESS NOTE    Arthur [name]  ZOX:[id number] DOB: [birth year] DOA: 07/28/2019 PCP: Arthur Hiss, MD   Brief Narrative:   38 year old male with history of HIV, substance abuse, depression and GERD presented on 07/28/2019 with dizziness, weakness and intermittent headache with noncompliance to his HIV medications for about 1 year due to affordability issue.  MRI of the brain in the ED showed widespread areas of abnormal low level restricted diffusion and postcontrast enhancement throughout both cerebral hemispheres, predominantly infratentorial with the dominant abnormality in the left anterior frontal white matter consistent with an opportunistic infection, such as toxoplasmosis, tuberculosis or fungal cerebritis/meningitis, or parasitic infection.  He was admitted; ID was consulted.  He then revealed that he had lost his vision in his right eye.  He was started on penicillin and ganciclovir for possible CMV retinitis versus synovitis.  Ophthalmology was consulted.  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  09/04/19: hypoglycemic this AM. TFs started per PEG   Assessment & Plan:   Principal Problem:   Toxoplasmosis Active Problems:   Neurosyphilis   Nausea and vomiting   Acquired immunodeficiency syndrome ([facility])   Bradycardia   Hyponatremia   Vision loss   Abnormal brain MRI   Palliative care encounter   Thrush of mouth and esophagus ([facility])   Goals of care, counseling/discussion   Protein-calorie malnutrition, severe   Weakness generalized   Dysphagia   Aspiration into airway   Palliative care by specialist   DNR (do not resuscitate) discussion  CNS toxoplasmosis AIDS/untreated HIV Oral thrush     - on pyrimethamine-leucovorin, sulfadiazine.       - continue induction phase treatment for total of 6 weeks, then switch to chronic phase as per ID.     - pt close to d/c; have reached out to ID for follow up instructions, appreciate assistance     -  continue tivicay plus Descovy     - continue Diflucan through today.     - continue Bactrim for prophylaxis     - CSF was positive for VDRL.  Patient has completed penicillin treatment     - Positive for CMV: Initially treated with ganciclovir which has subsequently been discontinued with negative CNS PCR     -TB Gold plus negative.  MTB probe negative.  RIPE treatment stopped  Acute toxic/metabolic encephalopathy     - Multifactorial including CNS infection and SIADH     - Repeat CT head without new process, evolving lesions of cerebellum and left frontal lobe     - MRI brain with multifocal signal abnormality and abnormal contrast-enhancement, most consistent with infectious cerebritis, consistent with cerebral toxoplasmosis, no discrete abscess     - More interactive than the last time I saw him; says he's thirsty, following commands  Right eye blindness, optic retinitis/neuritis     - Patient has been evaluated by ophthalmology.       - He received steroid injection on 08/02/2019     - Repeat MRI brain and orbits show improvement of brain lesions and optic neuritis, retinitis  Anemia of chronic disease     - Hemoglobin stable. will monitor  Leukopenia     - probably from AIDS; monitor for now  Severe hyponatremia     - Likely secondary to SIADH.       - Was treated with 3% saline and IV Lasix.      - Nephrology consulted; appreciate assistance, have now s/o'd     -  Resolved  Acute kidney injury     - Resolved; monitor  Dysphagia Severe protein calorie malnutrition/AIDS wasting syndrome     - TF per dietitian.       - SLP following and is still recommending n.p.o.     - s/p PEG tube placement by IR on 09/02/2019; TF per dietician  Hypoglycemic this AM. Start TF. Get him stable and then to CIR. Likely in AM.  DVT prophylaxis: lovenox Code Status: FULL   Disposition Plan: TBD  Consultants:   Nephrology  ID  Ophthalmology  Palliative Care  PCCM   Antimicrobials:  . descovy, tivicay, diflucan, pyrimethamine-leucovorin, bactrim, lamisil, sulfadiazine   ROS:  Denies CP, N, V, ab pain . Remainder 10-pt ROS is negative for all not previously mentioned.  Subjective: "Can I have water?"  Objective: Vitals:   09/03/19 1545 09/03/19 2002 09/04/19 0413 09/04/19 0844  BP: 93/62 92/61 93/70  90/60  Pulse: 70 62 (!) 56 (!) 57  Resp: 16 15 14 18   Temp: 97.9 F (36.6 C) 97.9 F (36.6 C) 97.8 F (36.6 C)   TempSrc: Oral Oral Oral   SpO2: 92% 99% 95% 93%  Weight:   48.3 kg   Height:        Intake/Output Summary (Last 24 hours) at 09/04/2019 1324 Last data filed at 09/04/2019 0557 Gross per 24 hour  Intake 0 ml  Output 50 ml  Net -50 ml   Filed Weights   09/02/19 0500 09/03/19 0441 09/04/19 0413  Weight: 54.4 kg 49.8 kg 48.3 kg    Examination:  General: 38 y.o. ill appearing male resting in bed in NAD Cardiovascular: RRR, +S1, S2, no m/g/r, equal pulses throughout Respiratory: CTABL, no w/r/r, normal WOB GI: BS+, NDNT, no masses noted, PEG noted MSK: No e/c/c Neuro: alert to name, follows commands   Data Reviewed: I have personally reviewed following labs and imaging studies.  CBC: Recent Labs  Lab 08/30/19 0216 09/02/19 0209  WBC 2.1* 1.9*  NEUTROABS 1.2* 0.9*  HGB 10.6* 10.8*  HCT 31.5* 31.9*  MCV 91.0 91.1  PLT 253 246   Basic Metabolic Panel: Recent Labs  Lab 08/30/19 0216 09/02/19 0209  NA 132* 135  K 4.4 4.5  CL 105 106  CO2 20* 24  GLUCOSE 108* 88  BUN 19 16  CREATININE 0.84 0.74  CALCIUM 8.6* 8.8*  MG 1.8  --    GFR: Estimated Creatinine Clearance: 87.2 mL/min (by C-G formula based on SCr of 0.74 mg/dL). Liver Function Tests: Recent Labs  Lab 08/30/19 0216 09/02/19 0209  AST 40 37  ALT 38 34  ALKPHOS 77 80  BILITOT 0.3 0.2*  PROT 7.0 7.2  ALBUMIN 2.5* 2.4*   No results for input(s): LIPASE, AMYLASE in the last 168 hours. No results for input(s): AMMONIA in the last 168 hours.  Coagulation Profile: Recent Labs  Lab 09/02/19 0209  INR 1.0   Cardiac Enzymes: No results for input(s): CKTOTAL, CKMB, CKMBINDEX, TROPONINI in the last 168 hours. BNP (last 3 results) No results for input(s): PROBNP in the last 8760 hours. HbA1C: No results for input(s): HGBA1C in the last 72 hours. CBG: Recent Labs  Lab 09/04/19 0000 09/04/19 0414 09/04/19 0653 09/04/19 0750 09/04/19 1126  GLUCAP 77 68* 97 66* 55*   Lipid Profile: No results for input(s): CHOL, HDL, LDLCALC, TRIG, CHOLHDL, LDLDIRECT in the last 72 hours. Thyroid Function Tests: No results for input(s): TSH, T4TOTAL, FREET4, T3FREE, THYROIDAB in the last 72 hours. Anemia Panel:  No results for input(s): VITAMINB12, FOLATE, FERRITIN, TIBC, IRON, RETICCTPCT in the last 72 hours. Sepsis Labs: No results for input(s): PROCALCITON, LATICACIDVEN in the last 168 hours.  No results found for this or any previous visit (from the past 240 hour(s)).    Radiology Studies: Ir Gastrostomy Tube Mod Sed  Result Date: 09/02/2019 INDICATION: 38 year old male with dysphagia EXAM: PERC PLACEMENT GASTROSTOMY MEDICATIONS: 2 g Ancef; Antibiotics were administered within 1 hour of the procedure. Scratch ANESTHESIA/SEDATION: Versed 0.5 mg IV; Fentanyl 25 mcg IV Moderate Sedation Time:  10 minutes The patient was continuously monitored during the procedure by the interventional radiology nurse under my direct supervision. CONTRAST:  5mL OMNIPAQUE IOHEXOL 300 MG/ML SOLN - administered into the gastric lumen. FLUOROSCOPY TIME:  Fluoroscopy Time: 1 minutes 6 seconds (6 mGy). COMPLICATIONS: None PROCEDURE: Informed written consent was obtained from the patient and the patient's family after a thorough discussion of the procedural risks, benefits and alternatives. All questions were addressed. Maximal Sterile Barrier Technique was utilized including caps, mask, sterile gowns, sterile gloves, sterile drape, hand hygiene and skin antiseptic. A  timeout was performed prior to the initiation of the procedure. The epigastrium was prepped with Betadine in a sterile fashion, and a sterile drape was applied covering the operative field. A sterile gown and sterile gloves were used for the procedure. A 5-French orogastric tube is placed under fluoroscopic guidance. Scout imaging of the abdomen confirms barium within the transverse colon. The stomach was distended with gas. Under fluoroscopic guidance, an 18 gauge needle was utilized to puncture the anterior wall of the body of the stomach. An Amplatz wire was advanced through the needle passing a T fastener into the lumen of the stomach. The T fastener was secured for gastropexy. A 9-French sheath was inserted. A snare was advanced through the 9-French sheath. A Teena Dunk was advanced through the orogastric tube. It was snared then pulled out the oral cavity, pulling the snare, as well. The leading edge of the gastrostomy was attached to the snare. It was then pulled down the esophagus and out the percutaneous site. Tube secured in place. Contrast was injected. Patient tolerated the procedure well and remained hemodynamically stable throughout. No complications were encountered and no significant blood loss encountered. IMPRESSION: Status post fluoroscopic placed percutaneous gastrostomy tube, with 20 [place] pull-through. Signed, Yvone Neu. Loreta Ave, DO [facility] Electronically Signed   By: Gilmer Mor D.O.   On: 09/02/2019 16:56     Scheduled Meds: . dextrose  12.5 g Intravenous STAT  . dolutegravir  50 mg Oral Daily  . emtricitabine-tenofovir AF  1 tablet Oral Daily  . enoxaparin (LOVENOX) injection  40 mg Subcutaneous Q24H  . famotidine  20 mg Per Tube Daily  . feeding supplement (PRO-STAT SUGAR FREE 64)  30 mL Per Tube Daily  . free water  100 mL Per Tube Q8H  . insulin aspart  0-9 Units Subcutaneous Q4H  . mouth rinse  15 mL Mouth Rinse BID   . multivitamin  15 mL Per Tube Daily  . Pyrimethamine-Leucovorin  50 mg Per Tube Daily  . sulfaDIAZINE  1,000 mg Per Tube Q6H  . sulfamethoxazole-trimethoprim  20 mL Per Tube 3 times weekly  . terbinafine   Topical Daily   Continuous Infusions: . chlorproMAZINE (THORAZINE) IV 50 mL/hr at 09/01/19 1500  . feeding supplement (OSMOLITE 1.5 CAL)    . fluconazole (DIFLUCAN) IV 400 mg (09/03/19 1039)     LOS: 37 days  Time spent: 25 minutes spent in the coordination of care today.    Jonnie Finner, DO [facility] Pager [phone]  If 7PM-7AM, please contact night-coverage [email or web address] Password [email or web address] 09/04/2019, 1:24 PM

## 2019-09-04 NOTE — Progress Notes (Signed)
Inpatient Rehabilitation-Admissions Coordinator   Spoke with Dr. Marylyn Ishihara regarding pt's readiness for CIR. Per Dr. Marylyn Ishihara, pt not quite medically ready for DC to IP Rehab today. Anticipate he may be ready tomorrow.   Will follow up tomorrow.   Pt's mother made aware.   Raechel Ache, OTR/L  Rehab Admissions Coordinator  [phone] 09/04/2019 2:02 PM

## 2019-09-04 NOTE — Progress Notes (Signed)
  Speech Language Pathology Treatment: Dysphagia  Patient Details Name: Arthur [name] MRN: [id number] DOB: 1980-10-31 Today's Date: 09/04/2019 Time: 2542-7062 SLP Time Calculation (min) (ACUTE ONLY): 16 min  Assessment / Plan / Recommendation Clinical Impression  Pt is moving more in the bed and is holding his head more upright than the last time he was seen by this SLP. Although he does not have active anterior spillage of his saliva, there is evidence of it in the bed and he has audible secretions at baseline. Throughout session, SLP provided Mod cues for pt to cough and try to expectorate secretions whenever his voice sounded wet, working on raising his awareness and facilitating overall management. Limited amounts of ice chips were given to practice labial closure and lingual manipulation, as resultant coughing was noted - concerning for ongoing aspiration. Pt says that he is sore at the site of his PEG tube, making it more difficult to cough effectively. He is using the yankauer with Mod cues from SLP to coordinate placement with coughing to better retrieve secretions. He remains a great candidate for CIR to maximize swallowing function and safety. I would recommend investigating his speech and mentation more once he gets there as well.    HPI HPI: Pt is a 38 y.o. male admitted with neurosyphillis, CNS toxoplasmosis. MRI 11/1 showed widespread areas concerning for infection, with most dominant abnormality in the L anterior frontal white matter. Hospital course was complicated by hyponatremia and respiratory decline, transferred to ICU on 11/14 but not requiring intubation. Multiple CXRs this admission without acute findings. PMH: substance abuse, HIV, GERD, depression, anxiety, anemia      SLP Plan  Continue with current plan of care       Recommendations  Diet recommendations: NPO Medication Administration: Via alternative means                Oral Care Recommendations: Oral care  QID Follow up Recommendations: Inpatient Rehab SLP Visit Diagnosis: Dysphagia, oropharyngeal phase (R13.12) Plan: Continue with current plan of care       GO                Venita Sheffield Jesscia Imm 09/04/2019, 12:25 PM  Pollyann Glen, M.A. [place] Acute Environmental education officer [phone] Office [phone]

## 2019-09-04 NOTE — Progress Notes (Signed)
Nutrition Follow-up  DOCUMENTATION CODES:   Severe malnutrition in context of chronic illness, Underweight  INTERVENTION:   Tube feeding:  -Osmolite 1.5 @ 20 ml/hr via PEG  -Increase by 10 ml Q4 hours to goal rate of 75ml/hr (1560 ml/day) -Pro-stat 30 ml daily -Free water per MD, currently 100 ml TID  Tube feeding regimen and current free water flushes provide 2440 kcal, 113 grams of protein, and 1489 ml of H2O (meeting >100% of kcal needs, 100% of protein needs).  NUTRITION DIAGNOSIS:   Severe Malnutrition related to chronic illness as evidenced by severe muscle depletion, severe fat depletion.  Ongoing  GOAL:   Patient will meet greater than or equal to 90% of their needs  Addressed via TF  MONITOR:   TF tolerance, Diet advancement, Labs, Weight trends  REASON FOR ASSESSMENT:   Consult Enteral/tube feeding initiation and management  ASSESSMENT:   38 y.o. male with medical history significant of untreated HIV, anemia, depression, GERD, substance abuse  Admitted for intracerebral opportunistic infection  11/10 - s/p Cortrak placement 11/18 - s/p small bore feeding tube placement by diagnostic radiology 11/27 - MBS with recommendations for NPO 12/7- s/p PEG placement   Pt working with PT during visit. RD pulled small bore tube at bedside as he is now s/p PEG. Worked with SLP this am and remains NPO. Will re-start Osmolite 1.5 and titrate to goal. Of note TF was increased per mother request as pt complained he was hungry. Will advance to same rate that he received previously. CBGs low- suspect resolution with initiation of TF.   Admission weight: 57 kg Current weight:48.3 kg (down 6 kg over the last week)   I/O: +6,226 ml since 11/25 UOP: 50 ml x 24 hrs   Medications: SS novolog, MVI Labs: CBG 55-120     Diet Order:   Diet Order            Diet NPO time specified  Diet effective midnight              EDUCATION NEEDS:   Not appropriate for  education at this time  Skin:  Skin Assessment: Reviewed RN Assessment  Last BM:  12/7  Height:   Ht Readings from Last 1 Encounters:  07/29/19 5\' 11"  (1.803 m)    Weight:   Wt Readings from Last 1 Encounters:  09/04/19 48.3 kg    Ideal Body Weight:  78.1 kg  BMI:  Body mass index is 14.85 kg/m.  Estimated Nutritional Needs:   Kcal:  2000-2200  Protein:  100-115g  Fluid:  Fluid Restricted   Mariana Single RD, LDN Clinical Nutrition Pager # [phone]

## 2019-09-05 LAB — CBC WITH DIFFERENTIAL/PLATELET
Abs Immature Granulocytes: 0.01 10*3/uL (ref 0.00–0.07)
Basophils Absolute: 0 10*3/uL (ref 0.0–0.1)
Basophils Relative: 1 %
Eosinophils Absolute: 0 10*3/uL (ref 0.0–0.5)
Eosinophils Relative: 2 %
HCT: 33.5 % — ABNORMAL LOW (ref 39.0–52.0)
Hemoglobin: 10.9 g/dL — ABNORMAL LOW (ref 13.0–17.0)
Immature Granulocytes: 1 %
Lymphocytes Relative: 32 %
Lymphs Abs: 0.6 10*3/uL — ABNORMAL LOW (ref 0.7–4.0)
MCH: 31.3 pg (ref 26.0–34.0)
MCHC: 32.5 g/dL (ref 30.0–36.0)
MCV: 96.3 fL (ref 80.0–100.0)
Monocytes Absolute: 0.4 10*3/uL (ref 0.1–1.0)
Monocytes Relative: 21 %
Neutro Abs: 0.9 10*3/uL — ABNORMAL LOW (ref 1.7–7.7)
Neutrophils Relative %: 43 %
Platelets: 216 10*3/uL (ref 150–400)
RBC: 3.48 MIL/uL — ABNORMAL LOW (ref 4.22–5.81)
RDW: 17 % — ABNORMAL HIGH (ref 11.5–15.5)
WBC: 1.9 10*3/uL — ABNORMAL LOW (ref 4.0–10.5)
nRBC: 1 % — ABNORMAL HIGH (ref 0.0–0.2)

## 2019-09-05 LAB — GLUCOSE, CAPILLARY
Glucose-Capillary: 89 mg/dL (ref 70–99)
Glucose-Capillary: 64 mg/dL — ABNORMAL LOW (ref 70–99)
Glucose-Capillary: 98 mg/dL (ref 70–99)
Glucose-Capillary: 133 mg/dL — ABNORMAL HIGH (ref 70–99)
Glucose-Capillary: 102 mg/dL — ABNORMAL HIGH (ref 70–99)
Glucose-Capillary: 108 mg/dL — ABNORMAL HIGH (ref 70–99)

## 2019-09-05 LAB — RENAL FUNCTION PANEL
Albumin: 2.2 g/dL — ABNORMAL LOW (ref 3.5–5.0)
Anion gap: 10 (ref 5–15)
BUN: 12 mg/dL (ref 6–20)
CO2: 23 mmol/L (ref 22–32)
Calcium: 8.8 mg/dL — ABNORMAL LOW (ref 8.9–10.3)
Chloride: 104 mmol/L (ref 98–111)
Creatinine, Ser: 0.95 mg/dL (ref 0.61–1.24)
GFR calc Af Amer: >60 mL/min (ref >60)
GFR calc non Af Amer: >60 mL/min (ref >60)
Glucose, Bld: 104 mg/dL — ABNORMAL HIGH (ref 70–99)
Phosphorus: 3.3 mg/dL (ref 2.5–4.6)
Potassium: 4 mmol/L (ref 3.5–5.1)
Sodium: 137 mmol/L (ref 135–145)

## 2019-09-05 LAB — MAGNESIUM: Magnesium: 1.5 mg/dL — ABNORMAL LOW (ref 1.7–2.4)

## 2019-09-05 MED ORDER — MIDODRINE HCL 5 MG PO TABS
2.5000 mg | ORAL_TABLET | Freq: Three times a day (TID) | ORAL | Status: DC
  Administered 2019-09-05 – 2019-09-07 (×6): 2.5 mg via ORAL
  Filled 2019-09-05 (×5): qty 1

## 2019-09-05 MED ORDER — LACTATED RINGERS IV BOLUS
500.0000 mL | Freq: Once | INTRAVENOUS | Status: AC
  Administered 2019-09-05: 500 mL via INTRAVENOUS

## 2019-09-05 NOTE — H&P (Signed)
Physical Medicine and Rehabilitation Admission H&P    Chief Complaint  Patient presents with   Functional deficits due to toxoplasmosis    Weakness    HPI:  Arthur [name] is a 38 year old male with history of HIV, substance abuse, depression, GERD who presented to ED on 07/28/19 with dizziness, weakness and intermittent headaches. Patient had been noncompliant with his HIV drugs due to lack of insurance/affordibility issues. MRI brain done revealing revealing widespread areas of diffusion abnormal contrast in both cerebral hemispheres predominantly left anterior frontal matter concerning for opportunistic infection as well as premature generalized atrophy. 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  ID consulted for input and patient started on  pyrimethamine-Leucovorin as well as sulfadiazine for 6 weeks treatment--end date 09/26/19.  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  Dr. Sherrine Maples consulted for input and felt that patient with optic neuropathy with retinitis OD    Follow up MRI brain showing slow response to treatment.  Esophageal candidiasis treated with IV fluconazole and ART added 11/30 without signs of IRIS.  Palliative care consulted to discuss goals of care and mother elected on aggressive treatment.  He has been maintained on tube feeds with NPO due to severe dysphagia with difficulty handing oral secretions.  PEG recommended for nutritional support and as mother was agreeable,  this was placed on 12/07 by Dr. Loreta Ave. 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 Encephalopathy with confusion resolving and he is showing improvement in activity tolerance. CIR recommended due to  functional deficits.    Review of Systems  Constitutional: Negative for chills and fever.  HENT: Negative for hearing loss and tinnitus.   Eyes: Positive for blurred vision.  Respiratory: Positive for cough and sputum production.   Cardiovascular: Negative for chest pain and palpitations.  Gastrointestinal: Positive for abdominal pain and diarrhea. Negative for heartburn and nausea.  Musculoskeletal: Negative for back pain and myalgias.  Skin: Negative for itching and rash.  Neurological: Positive for speech change and focal weakness. Negative for dizziness and headaches.     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 he reports that he has been smoking. He has a 6.80 pack-year smoking history. He has never used smokeless tobacco. He reports current drug use. Drug: Marijuana. He reports that he does not drink alcohol.    Allergies: No Known Allergies    Medications Prior to Admission  Medication Sig Dispense Refill   ondansetron (ZOFRAN ODT) 8 MG disintegrating tablet Take 1 tablet (8 mg total) by mouth every 8 (eight) hours as needed for nausea or vomiting. 20 tablet 0   ritonavir (NORVIR) 100 MG TABS tablet Take 1 tablet (100 mg  total) by mouth daily. (Patient not taking: Reported on 01/22/2018) 30 tablet 5   TRUVADA 200-300 MG per tablet TAKE 1 TABLET BY MOUTH DAILY (Patient not taking: Reported on 01/22/2018) 30 tablet 0    Drug Regimen Review  Drug regimen was reviewed and remains appropriate with no significant issues identified  Home: Home  Living Family/patient expects to be discharged to:: Private residence Living Arrangements: Other relatives Available Help at Discharge: Family, Available PRN/intermittently Type of Home: Apartment Home Access: Level entry Home Layout: One level Bathroom Shower/Tub: Nurse, adultTub/shower unit Bathroom Accessibility: Yes Home Equipment: None   Functional History: Prior Function Level of Independence: Independent  Functional Status:  Mobility: Bed Mobility Overal bed mobility: Needs Assistance Bed Mobility: Supine to Sit Rolling: Mod assist Sidelying to sit: Max assist Supine to sit: Min assist, HOB elevated(bed rails) Sit to supine: Mod assist, HOB elevated General bed mobility comments: max cuing & extra time with some assist to upright self vs R lateral lean Transfers Overall transfer level: Needs assistance Equipment used: Rolling walker (2 wheeled) Transfer via Lift Equipment: Stedy Transfers: Sit to/from Stand Sit to Stand: Min assist, +2 safety/equipment Stand pivot transfers: Mod assist, +2 safety/equipment General transfer comment: assist to place L hand on walker, min assist to rise and steady Ambulation/Gait Ambulation/Gait assistance: +2 physical assistance, +2 safety/equipment, Min assist Gait Distance (Feet): 40 Feet Assistive device: Rolling walker (2 wheeled) Gait Pattern/deviations: Decreased step length - right, Decreased step length - left, Narrow base of support, Trunk flexed, Scissoring, Decreased stride length General Gait Details: intermittent scissoring gait, up to heavy assistance at times for managing RW & turning with AD Gait velocity: decreased Gait velocity interpretation: <1.31 ft/sec, indicative of household ambulator    ADL: ADL Overall ADL's : Needs assistance/impaired Eating/Feeding: NPO Grooming: Oral care, Sitting, Moderate assistance Grooming Details (indicate cue type and reason): difficulty spitting, used suction Upper Body Bathing: Moderate  assistance, Bed level Upper Body Bathing Details (indicate cue type and reason): pt using R hand to wash chest and L underarm. pt using bil Ue to apply deordorant to each arm pit. pt total (A) for R arm and under arm. Pt with deep breath after task  Lower Body Bathing: Minimal assistance, Sit to/from stand Upper Body Dressing : Moderate assistance, Sitting Upper Body Dressing Details (indicate cue type and reason): to don front opening gown Lower Body Dressing: Total assistance, Sit to/from stand Lower Body Dressing Details (indicate cue type and reason): socks Toilet Transfer: Minimal assistance, +2 for physical assistance, +2 for safety/equipment, RW, Ambulation Toilet Transfer Details (indicate cue type and reason): simulated to recliner  Toileting- Clothing Manipulation and Hygiene: Minimal assistance Functional mobility during ADLs: Minimal assistance, +2 for physical assistance, +2 for safety/equipment, Rolling walker, Cueing for safety, Cueing for sequencing General ADL Comments: pt limited by impaired cognition, weakness, balance and activity tolerance  Cognition: Cognition Overall Cognitive Status: Impaired/Different from baseline Orientation Level: Oriented X4 Cognition Arousal/Alertness: Awake/alert Behavior During Therapy: WFL for tasks assessed/performed Overall Cognitive Status: Impaired/Different from baseline Area of Impairment: Following commands, Problem solving, Awareness, Attention, Safety/judgement Current Attention Level: Selective Following Commands: Follows one step commands consistently, Follows one step commands with increased time, Follows multi-step commands inconsistently Safety/Judgement: Decreased awareness of safety Awareness: Emergent Problem Solving: Slow processing, Requires verbal cues, Requires tactile cues, Decreased initiation, Difficulty sequencing General Comments: pt able to verbalize when prompted, soft volume Difficult to assess due to: Impaired  communication(pt encouraged to verbalize & was able to do so (short  phrases) 3x during session)   Blood pressure 92/64, pulse (!) 51, temperature (!) 97.5 F (36.4 C), resp. rate 14, height  (1.803 m), weight 48.3 kg, SpO2 96 %. Physical Exam  Nursing note and vitals reviewed. Constitutional: He is oriented to person, place, and time. He appears cachectic.  Frail appearing. Lying in bed  HENT:  Head: Normocephalic and atraumatic.  Eyes: Pupils are equal, round, and reactive to light. Left eye exhibits no discharge.  Neck: No tracheal deviation present. No thyromegaly present.  Cardiovascular: Normal rate and regular rhythm.  Respiratory: Effort normal and breath sounds normal.  A few rhonchi and upper airway sounds  GI: Soft. There is abdominal tenderness.  PEG  Musculoskeletal:        General: No edema.     Cervical back: Normal range of motion.  Neurological: He is alert and oriented to person, place, and time.  Dysarthric speech, dysphonic. Delayed processing. Minimal verbalization. Moves all 4's. Delayed processing. Senses pain. Limited insight and awareness  Skin: Skin is warm and dry.  Bilateral feet with onychomycotic toe nails and bilateral feet with ichthyosis.   Psychiatric:  Flat, withdrawn    Results for orders placed or performed during the hospital encounter of 07/28/19 (from the past 48 hour(s))  Glucose, capillary     Status: Abnormal   Collection Time: 09/03/19 11:57 AM  Result Value Ref Range   Glucose-Capillary 64 (L) 70 - 99 mg/dL  Glucose, capillary     Status: Abnormal   Collection Time: 09/03/19  1:00 PM  Result Value Ref Range   Glucose-Capillary 120 (H) 70 - 99 mg/dL  Glucose, capillary     Status: Abnormal   Collection Time: 09/03/19  3:41 PM  Result Value Ref Range   Glucose-Capillary 57 (L) 70 - 99 mg/dL  Glucose, capillary     Status: Abnormal   Collection Time: 09/03/19  4:41 PM  Result Value Ref Range   Glucose-Capillary 108 (H) 70 -  99 mg/dL  Glucose, capillary     Status: None   Collection Time: 09/03/19  8:03 PM  Result Value Ref Range   Glucose-Capillary 72 70 - 99 mg/dL  Glucose, capillary     Status: None   Collection Time: 09/04/19 12:00 AM  Result Value Ref Range   Glucose-Capillary 77 70 - 99 mg/dL  Glucose, capillary     Status: Abnormal   Collection Time: 09/04/19  4:14 AM  Result Value Ref Range   Glucose-Capillary 68 (L) 70 - 99 mg/dL  Glucose, capillary     Status: None   Collection Time: 09/04/19  6:53 AM  Result Value Ref Range   Glucose-Capillary 97 70 - 99 mg/dL  Glucose, capillary     Status: Abnormal   Collection Time: 09/04/19  7:50 AM  Result Value Ref Range   Glucose-Capillary 66 (L) 70 - 99 mg/dL  Glucose, capillary     Status: Abnormal   Collection Time: 09/04/19 11:26 AM  Result Value Ref Range   Glucose-Capillary 55 (L) 70 - 99 mg/dL  Glucose, capillary     Status: Abnormal   Collection Time: 09/04/19  4:11 PM  Result Value Ref Range   Glucose-Capillary 68 (L) 70 - 99 mg/dL  Glucose, capillary     Status: Abnormal   Collection Time: 09/04/19  7:55 PM  Result Value Ref Range   Glucose-Capillary 67 (L) 70 - 99 mg/dL  Glucose, capillary     Status: Abnormal   Collection Time: 09/04/19  8:30 PM  Result Value Ref Range   Glucose-Capillary 157 (H) 70 - 99 mg/dL  Glucose, capillary     Status: None   Collection Time: 09/04/19  8:53 PM  Result Value Ref Range   Glucose-Capillary 95 70 - 99 mg/dL  Glucose, capillary     Status: None   Collection Time: 09/04/19 11:45 PM  Result Value Ref Range   Glucose-Capillary 80 70 - 99 mg/dL  Glucose, capillary     Status: None   Collection Time: 09/05/19  3:45 AM  Result Value Ref Range   Glucose-Capillary 89 70 - 99 mg/dL  CBC with Differential/Platelet     Status: Abnormal   Collection Time: 09/05/19  5:11 AM  Result Value Ref Range   WBC 1.9 (L) 4.0 - 10.5 K/uL   RBC 3.48 (L) 4.22 - 5.81 MIL/uL   Hemoglobin 10.9 (L) 13.0 - 17.0 g/dL     HCT 10.2 (L) 72.5 - 52.0 %   MCV 96.3 80.0 - 100.0 fL   MCH 31.3 26.0 - 34.0 pg   MCHC 32.5 30.0 - 36.0 g/dL   RDW 36.6 (H) 44.0 - 34.7 %   Platelets 216 150 - 400 K/uL   nRBC 1.0 (H) 0.0 - 0.2 %   Neutrophils Relative % 43 %   Neutro Abs 0.9 (L) 1.7 - 7.7 K/uL   Lymphocytes Relative 32 %   Lymphs Abs 0.6 (L) 0.7 - 4.0 K/uL   Monocytes Relative 21 %   Monocytes Absolute 0.4 0.1 - 1.0 K/uL   Eosinophils Relative 2 %   Eosinophils Absolute 0.0 0.0 - 0.5 K/uL   Basophils Relative 1 %   Basophils Absolute 0.0 0.0 - 0.1 K/uL   Immature Granulocytes 1 %   Abs Immature Granulocytes 0.01 0.00 - 0.07 K/uL    Comment: Performed at [facility], [address], [place], [place] [address]  Magnesium     Status: Abnormal   Collection Time: 09/05/19  5:11 AM  Result Value Ref Range   Magnesium 1.5 (L) 1.7 - 2.4 mg/dL    Comment: Performed at [facility], [address], [place], [place] [address]  Renal function panel     Status: Abnormal   Collection Time: 09/05/19  5:11 AM  Result Value Ref Range   Sodium 137 135 - 145 mmol/L   Potassium 4.0 3.5 - 5.1 mmol/L   Chloride 104 98 - 111 mmol/L   CO2 23 22 - 32 mmol/L   Glucose, Bld 104 (H) 70 - 99 mg/dL   BUN 12 6 - 20 mg/dL   Creatinine, Ser 6.43 0.61 - 1.24 mg/dL   Calcium 8.8 (L) 8.9 - 10.3 mg/dL   Phosphorus 3.3 2.5 - 4.6 mg/dL   Albumin 2.2 (L) 3.5 - 5.0 g/dL   GFR calc non Af Amer >60 >60 mL/min   GFR calc Af Amer >60 >60 mL/min   Anion gap 10 5 - 15    Comment: Performed at [facility], [address], [place], [place] [address]  Glucose, capillary     Status: Abnormal   Collection Time: 09/05/19  7:53 AM  Result Value Ref Range   Glucose-Capillary 64 (L) 70 - 99 mg/dL  Glucose, capillary     Status: None   Collection Time: 09/05/19  9:28 AM  Result Value Ref Range   Glucose-Capillary 98 70 - 99 mg/dL   No results found.     Medical Problem List and  Plan: 1.  Functional and cognitive deficits  secondary to CNS toxoplasmosis  -patient may may shower  -ELOS/Goals: 12-16 days supervision to min assist 2.  Antithrombotics: -DVT/anticoagulation:  Pharmaceutical: Lovenox  -antiplatelet therapy: N/A 3. Pain Management: Tramadol or Tylenol prn for abdominal pain.  4. Mood: LCSW to follow for evaluation and support  -antipsychotic agents: N/A 5. Neuropsych: This patient is not fully capable of making decisions on his own behalf. 6. Skin/Wound Care: routine pressure relief measures 7. Fluids/Electrolytes/Nutrition: Monitor I/O. Check lytes on 11/14   8. 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  9. CNS toxoplasmosis: On Pyrimethamine-Leucovorin and Sulfadiazine thorough 12/30. 10. Malnutrition/Severe dysphagia: Continue NPO. ON Osmolite with prostat--RD to follow for input.  11. Hypomagnesemia: IV supplement as needed. Will add oral supplement. Recheck levels.       Bary Leriche, PA-C 09/05/2019

## 2019-09-05 NOTE — Progress Notes (Signed)
Inpatient Rehabilitation-Admissions Coordinator   Will hold on admit to CIR today as pt continues to exhibit low BP, blood sugar, and HR and had a Yellow MEWS score last night and early this AM. Dr. Marylyn Ishihara aware that we will reassess tomorrow.   Will follow up tomorrow for possible admit.   Jhonnie Garner, OTR/L  Rehab Admissions Coordinator  [phone] 09/05/2019 12:40 PM

## 2019-09-05 NOTE — Progress Notes (Signed)
Patient's BP 73/55 HR 89,recheck manual BP 70/46. Patient denies any c/o. Text paged Kirby,NP. Order received in epic. Will continue to monitor. Murriel Holwerda, Wonda Cheng, Therapist, sports

## 2019-09-05 NOTE — Progress Notes (Signed)
[name]  PROGRESS NOTE    Arthur [name]  ENI:[id number] DOB: 1981-09-10 DOA: 07/28/2019 PCP: Randall Hiss, MD   Brief Narrative:   38 year old male with history of HIV, substance abuse, depression and GERD presented on 07/28/2019 with dizziness, weakness and intermittent headache with noncompliance to his HIV medications for about 1 year due to affordability issue. MRI of the brain in the ED showed widespread areas of abnormal low level restricted diffusion and postcontrast enhancement throughout both cerebral hemispheres, predominantly infratentorial with the dominant abnormality in the left anterior frontal white matter consistent with an opportunistic infection, such as toxoplasmosis, tuberculosis or fungal cerebritis/meningitis, or parasitic infection. He was admitted; ID was consulted. He then revealed that he had lost his vision in his right eye. He was started on penicillin and ganciclovir for possible CMV retinitis versus synovitis. Ophthalmology was consulted. 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  09/04/19: hypoglycemic this AM. TFs started per PEG 09/05/19: hypotensive ON. hypopglycemic ON   Assessment & Plan:   Principal Problem:   Toxoplasmosis Active Problems:   Neurosyphilis   Nausea and vomiting   Acquired immunodeficiency syndrome (HCC)   Bradycardia   Hyponatremia   Vision loss   Abnormal brain MRI   Palliative care encounter   Thrush of mouth and esophagus (HCC)   Goals of care, counseling/discussion   Protein-calorie malnutrition, severe   Weakness generalized   Dysphagia   Aspiration into airway   Palliative care by specialist   DNR (do not resuscitate) discussion  CNS toxoplasmosis AIDS/untreated HIV Oral thrush     - on pyrimethamine-leucovorin, sulfadiazine.       - continue induction phase treatment for total of 6 weeks, then switch to chronic phase as per ID.     - pt close to d/c; have reached out to ID for follow  up instructions, appreciate assistance     - continue tivicay plus Descovy     - continue Diflucan through today.     - continue Bactrim for prophylaxis     - CSF was positive for VDRL.  Patient has completed penicillin treatment     - Positive for CMV: Initially treated with ganciclovir which has subsequently been discontinued with negative CNS PCR     -TB Gold plus negative.  MTB probe negative.  RIPE treatment stopped     - 09/15/19: no changes for today  Acute toxic/metabolic encephalopathy     - Multifactorial including CNS infection and SIADH     - Repeat CT head without new process, evolving lesions of cerebellum and left frontal lobe     - MRI brain with multifocal signal abnormality and abnormal contrast-enhancement, most consistent with infectious cerebritis, consistent with cerebral toxoplasmosis, no discrete abscess     - More interactive than the last time I saw him; says he's thirsty, following commands     - 09/05/19: no changes for today  Right eye blindness, optic retinitis/neuritis     - Patient has been evaluated by ophthalmology.       - He received steroid injection on 08/02/2019     - Repeat MRI brain and orbits show improvement of brain lesions and optic neuritis, retinitis  Anemia of chronic disease     - Hemoglobin stable. will monitor  Leukopenia     - probably from AIDS; monitor for now  Severe hyponatremia     - Likely secondary to SIADH.       - Was treated with  3% saline and IV Lasix.      - Nephrology consulted; appreciate assistance, have now s/o'd     - Resolved  Hypotension     - asymptomatic     - SBPs down into the 70's ON     - no signs of new infection     - will add midodrine; monitor  Acute kidney injury     - Resolved; monitor  Dysphagia Severe protein calorie malnutrition/AIDS wasting syndrome Hypoglycemia     - TF per dietitian.       - SLP following and is still recommending n.p.o.     - s/p PEG tube placement by IR on  09/02/2019; TF per dietician     - 09/05/19: PEG feeds on continuous; glucose starting to pick up, monitor  Was hypoglycemic and hypotensive ON. Will hold off CIR admit another day to get him more stable.   DVT prophylaxis: lovenox Code Status: FULL   Disposition Plan: To CIR when stable  Consultants:   PCCM  Palliative Care  Ophthalmology  ID  Nephrology  Antimicrobials:  . descovy, tivicay, diflucan, pyrimethamine-leucovorin, bactrim, lamisil, sulfadiazine   Subjective: "See you later!"  Objective: Vitals:   09/05/19 0154 09/05/19 0341 09/05/19 0556 09/05/19 0926  BP: (!) 82/64 94/74 96/66  92/64  Pulse: (!) 52 (!) 47 (!) 58 (!) 51  Resp: 17 16 14 14   Temp: 97.8 F (36.6 C) 98 F (36.7 C) 98 F (36.7 C) (!) 97.5 F (36.4 C)  TempSrc: Oral Oral Oral   SpO2: 94% 100% 97% 96%  Weight:      Height:        Intake/Output Summary (Last 24 hours) at 09/05/2019 1621 Last data filed at 09/05/2019 3235 Gross per 24 hour  Intake 6355.22 ml  Output 300 ml  Net 6055.22 ml   Filed Weights   09/02/19 0500 09/03/19 0441 09/04/19 0413  Weight: 54.4 kg 49.8 kg 48.3 kg    Examination:  General: 38 y.o. male resting in bed in NAD Cardiovascular: RRR, +S1, S2, no m/g/r Respiratory: CTABL, no w/r/r, normal WOB GI: BS+, NDNT, soft, PEG in place MSK: No e/c/c Neuro: alert, following commands Psyc: calm/cooperative   Data Reviewed: I have personally reviewed following labs and imaging studies.  CBC: Recent Labs  Lab 08/30/19 0216 09/02/19 0209 09/05/19 0511  WBC 2.1* 1.9* 1.9*  NEUTROABS 1.2* 0.9* 0.9*  HGB 10.6* 10.8* 10.9*  HCT 31.5* 31.9* 33.5*  MCV 91.0 91.1 96.3  PLT 253 246 573   Basic Metabolic Panel: Recent Labs  Lab 08/30/19 0216 09/02/19 0209 09/05/19 0511  NA 132* 135 137  K 4.4 4.5 4.0  CL 105 106 104  CO2 20* 24 23  GLUCOSE 108* 88 104*  BUN 19 16 12   CREATININE 0.84 0.74 0.95  CALCIUM 8.6* 8.8* 8.8*  MG 1.8  --  1.5*  PHOS  --   --   3.3   GFR: Estimated Creatinine Clearance: 73.4 mL/min (by C-G formula based on SCr of 0.95 mg/dL). Liver Function Tests: Recent Labs  Lab 08/30/19 0216 09/02/19 0209 09/05/19 0511  AST 40 37  --   ALT 38 34  --   ALKPHOS 77 80  --   BILITOT 0.3 0.2*  --   PROT 7.0 7.2  --   ALBUMIN 2.5* 2.4* 2.2*   No results for input(s): LIPASE, AMYLASE in the last 168 hours. No results for input(s): AMMONIA in the last 168 hours. Coagulation Profile: Recent Labs  Lab 09/02/19 0209  INR 1.0   Cardiac Enzymes: No results for input(s): CKTOTAL, CKMB, CKMBINDEX, TROPONINI in the last 168 hours. BNP (last 3 results) No results for input(s): PROBNP in the last 8760 hours. HbA1C: No results for input(s): HGBA1C in the last 72 hours. CBG: Recent Labs  Lab 09/04/19 2345 09/05/19 0345 09/05/19 0753 09/05/19 0928 09/05/19 1151  GLUCAP 80 89 64* 98 133*   Lipid Profile: No results for input(s): CHOL, HDL, LDLCALC, TRIG, CHOLHDL, LDLDIRECT in the last 72 hours. Thyroid Function Tests: No results for input(s): TSH, T4TOTAL, FREET4, T3FREE, THYROIDAB in the last 72 hours. Anemia Panel: No results for input(s): VITAMINB12, FOLATE, FERRITIN, TIBC, IRON, RETICCTPCT in the last 72 hours. Sepsis Labs: No results for input(s): PROCALCITON, LATICACIDVEN in the last 168 hours.  No results found for this or any previous visit (from the past 240 hour(s)).    Radiology Studies: No results found.   Scheduled Meds: . dolutegravir  50 mg Oral Daily  . emtricitabine-tenofovir AF  1 tablet Oral Daily  . enoxaparin (LOVENOX) injection  40 mg Subcutaneous Q24H  . famotidine  20 mg Per Tube Daily  . feeding supplement (PRO-STAT SUGAR FREE 64)  30 mL Per Tube Daily  . free water  100 mL Per Tube Q8H  . insulin aspart  0-9 Units Subcutaneous Q4H  . mouth rinse  15 mL Mouth Rinse BID  . midodrine  2.5 mg Oral TID WC  . multivitamin  15 mL Per Tube Daily  . Pyrimethamine-Leucovorin  50 mg Per Tube  Daily  . sulfaDIAZINE  1,000 mg Per Tube Q6H  . sulfamethoxazole-trimethoprim  20 mL Per Tube 3 times weekly  . terbinafine   Topical Daily   Continuous Infusions: . chlorproMAZINE (THORAZINE) IV 50 mL/hr at 09/01/19 1500  . feeding supplement (OSMOLITE 1.5 CAL) 1,000 mL (09/05/19 1358)     LOS: 38 days    Time spent: 25 minutes spent in the coordination of care today.    Arthur Spikeyrone A Berdella Bacot, DO [facility] Pager [phone]  If 7PM-7AM, please contact night-coverage [email or web address] Password [email or web address] 09/05/2019, 4:21 PM

## 2019-09-06 LAB — RENAL FUNCTION PANEL
Albumin: 2.3 g/dL — ABNORMAL LOW (ref 3.5–5.0)
Anion gap: 8 (ref 5–15)
BUN: 13 mg/dL (ref 6–20)
CO2: 22 mmol/L (ref 22–32)
Calcium: 8.5 mg/dL — ABNORMAL LOW (ref 8.9–10.3)
Chloride: 104 mmol/L (ref 98–111)
Creatinine, Ser: 0.76 mg/dL (ref 0.61–1.24)
GFR calc Af Amer: >60 mL/min (ref >60)
GFR calc non Af Amer: >60 mL/min (ref >60)
Glucose, Bld: 107 mg/dL — ABNORMAL HIGH (ref 70–99)
Phosphorus: 2.8 mg/dL (ref 2.5–4.6)
Potassium: 4.2 mmol/L (ref 3.5–5.1)
Sodium: 134 mmol/L — ABNORMAL LOW (ref 135–145)

## 2019-09-06 LAB — CBC WITH DIFFERENTIAL/PLATELET
Abs Immature Granulocytes: 0 10*3/uL (ref 0.00–0.07)
Basophils Absolute: 0 10*3/uL (ref 0.0–0.1)
Basophils Relative: 0 %
Eosinophils Absolute: 0 10*3/uL (ref 0.0–0.5)
Eosinophils Relative: 0 %
HCT: 32.3 % — ABNORMAL LOW (ref 39.0–52.0)
Hemoglobin: 10.6 g/dL — ABNORMAL LOW (ref 13.0–17.0)
Lymphocytes Relative: 24 %
Lymphs Abs: 0.4 10*3/uL — ABNORMAL LOW (ref 0.7–4.0)
MCH: 31 pg (ref 26.0–34.0)
MCHC: 32.8 g/dL (ref 30.0–36.0)
MCV: 94.4 fL (ref 80.0–100.0)
Monocytes Absolute: 0.2 10*3/uL (ref 0.1–1.0)
Monocytes Relative: 9 %
Neutro Abs: 1.2 10*3/uL — ABNORMAL LOW (ref 1.7–7.7)
Neutrophils Relative %: 67 %
Platelets: 215 10*3/uL (ref 150–400)
RBC: 3.42 MIL/uL — ABNORMAL LOW (ref 4.22–5.81)
RDW: 17 % — ABNORMAL HIGH (ref 11.5–15.5)
WBC: 1.8 10*3/uL — ABNORMAL LOW (ref 4.0–10.5)
nRBC: 0 % (ref 0.0–0.2)
nRBC: 0 /100 WBC

## 2019-09-06 LAB — GLUCOSE, CAPILLARY
Glucose-Capillary: 101 mg/dL — ABNORMAL HIGH (ref 70–99)
Glucose-Capillary: 85 mg/dL (ref 70–99)
Glucose-Capillary: 88 mg/dL (ref 70–99)
Glucose-Capillary: 91 mg/dL (ref 70–99)
Glucose-Capillary: 92 mg/dL (ref 70–99)
Glucose-Capillary: 93 mg/dL (ref 70–99)

## 2019-09-06 LAB — MAGNESIUM: Magnesium: 1.6 mg/dL — ABNORMAL LOW (ref 1.7–2.4)

## 2019-09-06 MED ORDER — SULFADIAZINE 500 MG PO TABS: 1000.0000 mg | ORAL_TABLET | Freq: Four times a day (QID) | ORAL | Status: DC

## 2019-09-06 MED ORDER — OSMOLITE 1.5 CAL PO LIQD: 1000.0000 mL | ORAL | 0 refills | Status: DC

## 2019-09-06 MED ORDER — MIDODRINE HCL 2.5 MG PO TABS: 2.5000 mg | ORAL_TABLET | Freq: Three times a day (TID) | ORAL | Status: DC

## 2019-09-06 MED ORDER — PRO-STAT SUGAR FREE PO LIQD: 30.0000 mL | Freq: Every day | ORAL | 0 refills | Status: DC

## 2019-09-06 MED ORDER — FAMOTIDINE 40 MG/5ML PO SUSR: 20.0000 mg | Freq: Every day | ORAL | 0 refills | Status: DC

## 2019-09-06 MED ORDER — PYRIMETHAMINE-LEUCOVORIN 50-25 MG PO CAPS: 50.0000 mg | ORAL_CAPSULE | Freq: Every day | ORAL | Status: DC

## 2019-09-06 MED ORDER — ACETAMINOPHEN 325 MG PO TABS: 650.0000 mg | ORAL_TABLET | Freq: Four times a day (QID) | ORAL | Status: DC | PRN

## 2019-09-06 MED ORDER — TERBINAFINE HCL 1 % EX CREA: TOPICAL_CREAM | Freq: Every day | CUTANEOUS | 0 refills | Status: DC

## 2019-09-06 MED ORDER — TRAMADOL HCL 50 MG PO TABS: 50.0000 mg | ORAL_TABLET | Freq: Four times a day (QID) | ORAL | Status: DC | PRN

## 2019-09-06 MED ORDER — DOLUTEGRAVIR SODIUM 50 MG PO TABS: 50.0000 mg | ORAL_TABLET | Freq: Every day | ORAL | Status: DC

## 2019-09-06 MED ORDER — EMTRICITABINE-TENOFOVIR AF 200-25 MG PO TABS: 1.0000 | ORAL_TABLET | Freq: Every day | ORAL | Status: DC

## 2019-09-06 MED ORDER — SULFAMETHOXAZOLE-TRIMETHOPRIM 200-40 MG/5ML PO SUSP: 20.0000 mL | ORAL | 0 refills | Status: DC

## 2019-09-06 MED ORDER — FREE WATER: 100.0000 mL | Freq: Three times a day (TID) | Status: DC

## 2019-09-06 MED ORDER — ADULT MULTIVITAMIN LIQUID CH: 15.0000 mL | Freq: Every day | ORAL | Status: DC

## 2019-09-06 MED ORDER — MAGNESIUM SULFATE 2 GM/50ML IV SOLN
2.0000 g | Freq: Once | INTRAVENOUS | Status: AC
  Administered 2019-09-06: 2 g via INTRAVENOUS
  Filled 2019-09-06: qty 50

## 2019-09-06 NOTE — Discharge Summary (Signed)
. Physician Discharge Summary  Arthur [name] ZOX:[id number] DOB: 1981-09-19 DOA: 07/28/2019  PCP: Randall Hiss, MD  Admit date: 07/28/2019 Discharge date: 09/07/2019  Admitted From: Home Disposition:  Discharged to CIR  Recommendations for Outpatient Follow-up:  1. Follow up with PCP in 1-2 weeks 2. Please obtain BMP/CBC in one week 3. Follow up with ID as scheduled.  Discharge Condition: Stable  CODE STATUS: FULL   Brief/Interim Summary: 38 year old male with history of HIV, substance abuse, depression and GERD presented on 07/28/2019 with dizziness, weakness and intermittent headache with noncompliance to his HIV medications for about 1 year due to affordability issue. MRI of the brain in the ED showed widespread areas of abnormal low level restricted diffusion and postcontrast enhancement throughout both cerebral hemispheres, predominantly infratentorial with the dominant abnormality in the left anterior frontal white matter consistent with an opportunistic infection, such as toxoplasmosis, tuberculosis or fungal cerebritis/meningitis, or parasitic infection. He was admitted; ID was consulted. He then revealed that he had lost his vision in his right eye. He was started on penicillin and ganciclovir for possible CMV retinitis versus synovitis. Ophthalmology was consulted. 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  09/04/19: hypoglycemic this AM. TFs started per PEG 09/05/19: hypotensive ON. hypopglycemic ON 09/06/19: BP ok ON and glucose was stable. Ok for transfer to [facility].  Discharge Diagnoses:  Principal Problem:   Toxoplasmosis Active Problems:   Neurosyphilis   Nausea and vomiting   Acquired immunodeficiency syndrome (HCC)   Bradycardia   Hyponatremia   Vision loss   Abnormal brain MRI   Palliative care encounter   Thrush of mouth and esophagus (HCC)   Goals of care, counseling/discussion   Protein-calorie malnutrition, severe    Weakness generalized   Dysphagia   Aspiration into airway   Palliative care by specialist   DNR (do not resuscitate) discussion  CNS toxoplasmosis AIDS/untreated HIV Oral thrush - on pyrimethamine-leucovorin, sulfadiazine.  - continue induction phase treatment for total of 6 weeks, then switch to chronic phase as per ID. - pt close to d/c; have reached out to ID for follow up instructions, appreciate assistance - continue tivicay plus Descovy - continue Diflucan through today. - continue Bactrim for prophylaxis - CSF was positive for VDRL. Patient has completed penicillin treatment - Positive for CMV: Initially treated with ganciclovir which has subsequently been discontinued with negative CNS PCR -TB Gold plus negative. MTB probe negative. RIPE treatment stopped     - Per ID: Received notification today that Arthur [name] will be transitioning to CIR. He will need to continue current dose of pyrimethamine-leucovorin, sulfadiazine, Bactrim, and Tivicay and Descovy indefinitely. His initial treatment course is scheduled to end on 09/26/19 and will continue with maintenance at that point. Spoke with ID pharmacy and medications have been ordered for duration of hospitalization and rehabilitation. Please notify ID pharmacy ~ 1 week prior to discharge from CIR to coordinate set up of home medications. He has follow up in the ID office already scheduled on 09/30/19 with Dr. [name].     - 09/07/19: He is stable for transfer to CIR  Acute toxic/metabolic encephalopathy - Multifactorial including CNS infection and SIADH - Repeat CT head without new process, evolving lesions of cerebellum and left frontal lobe - MRI brain with multifocal signal abnormality and abnormal contrast-enhancement, most consistent with infectious cerebritis, consistent with cerebral toxoplasmosis, no discrete abscess - More interactive than the last time I saw him; says  he's thirsty, following  commands     - 09/07/19: He is stable for transfer to CIR  Right eye blindness, optic retinitis/neuritis - Patient has been evaluated by ophthalmology.  - He received steroid injection on 08/02/2019 - Repeat MRI brain and orbits show improvement of brain lesions and optic neuritis, retinitis  Anemia of chronic disease - Hemoglobin stable. 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  - Was treated with 3% saline and IV Lasix.  - Nephrology consulted; appreciate assistance, have now s/o'd - Resolved  Hypotension     - asymptomatic     - SBPs down into the 70's ON     - no signs of new infection     - will add midodrine; monitor     - 09/07/19: BP good ON; he is stable for transfer to CIR  Acute kidney injury - Resolved; monitor  Dysphagia Severe protein calorie malnutrition/AIDS wasting syndrome Hypoglycemia - TF per dietitian.  - SLP following and is still recommending n.p.o. - s/p PEG tube placement by IR on 09/02/2019; TF per dietician     - 09/05/19: PEG feeds on continuous; glucose starting to pick up, monitor     - 09/06/19: His glucose was good ON; ok for transfer to CIR     - RD recommendations: Tube feeding:          - Osmolite 1.5 @ 20 ml/hr via PEG          - Increase by 10 ml Q4 hours to goal rate of 5ml/hr (1560 ml/day)         - Pro-stat 30 ml daily         - Free water per MD, currently 100 ml TID         - Tube feeding regimenand current free waterflushes provide 2440kcal, 113grams of protein, and of H2O (meeting >100% of kcal needs, 100% of protein needs).     - one hypoglycemic event ON; can continue to have TF adjusted with RD in CIR  Discharge Instructions   Allergies as of 09/06/2019   No Known Allergies     Medication List    STOP taking these medications   ondansetron 8 MG  disintegrating tablet Commonly known as: Zofran ODT   ritonavir 100 MG Tabs tablet Commonly known as: Norvir   Truvada 200-300 MG tablet Generic drug: emtricitabine-tenofovir     TAKE these medications   acetaminophen 325 MG tablet Commonly known as: TYLENOL Take 2 tablets (650 mg total) by mouth every 6 (six) hours as needed for mild pain (or Fever >/= 101).   dolutegravir 50 MG tablet Commonly known as: TIVICAY Take 1 tablet (50 mg total) by mouth daily.   emtricitabine-tenofovir AF 200-25 MG tablet Commonly known as: DESCOVY Take 1 tablet by mouth daily.   famotidine 40 MG/5ML suspension Commonly known as: PEPCID Place 2.5 mLs (20 mg total) into feeding tube daily.   feeding supplement (OSMOLITE 1.5 CAL) Liqd Place 1,000 mLs into feeding tube continuous.   feeding supplement (PRO-STAT SUGAR FREE 64) Liqd Place 30 mLs into feeding tube daily.   free water Soln Place 100 mLs into feeding tube every 8 (eight) hours.   midodrine 2.5 MG tablet Commonly known as: PROAMATINE Take 1 tablet (2.5 mg total) by mouth 3 (three) times daily with meals.   multivitamin Liqd Place 15 mLs into feeding tube daily.   Pyrimethamine-Leucovorin 50-25 MG Caps Place 50 mg  into feeding tube daily.   sulfaDIAZINE 500 MG tablet Place 2 tablets (1,000 mg total) into feeding tube every 6 (six) hours.   sulfamethoxazole-trimethoprim 200-40 MG/5ML suspension Commonly known as: BACTRIM Place 20 mLs into feeding tube 3 (three) times a week.   terbinafine 1 % cream Commonly known as: LAMISIL Apply topically daily.   traMADol 50 MG tablet Commonly known as: ULTRAM Place 1 tablet (50 mg total) into feeding tube every 6 (six) hours as needed for moderate pain.      Follow-up Information    Daiva EvesVan Dam, Lisette Grinderornelius N, MD Follow up on 09/30/2019.   Specialty: Infectious Diseases Why: 11:15 a.m. appointment.  Please arrive 15 minutes prior to appointment time. Please call the clinic 48 hours in  advance if you will have transportation difficulty getting to this appointment.  Contact information: [address] [place] [id number] [phone]          No Known Allergies  Consultations:  ID  Palliative Care  Ophthalmology  ID  Nephrology   Procedures/Studies: CT ABDOMEN WO CONTRAST  Result Date: 08/31/2019 CLINICAL DATA:  Evaluate anatomy for potential percutaneous gastrostomy tube placement. EXAM: CT ABDOMEN WITHOUT CONTRAST TECHNIQUE: Multidetector CT imaging of the abdomen was performed following the standard protocol without IV contrast. COMPARISON:  None. FINDINGS: Lack of intravenous contrast limits the ability to evaluate solid abdominal organs. Lower chest: Limited visualization of the lower thorax demonstrates consolidative opacities within the image right lower lobe with associated air bronchograms (image 7, series 4). Ill-defined slight tree-in-bud nodular opacities are seen within the contralateral left lower lobe (image 8, series 4). No pleural effusion. Normal heart size. No pericardial effusion. There is diffuse decreased attenuation of the intra cardiac blood pool as could be seen in the setting of anemia. Hepatobiliary: Normal hepatic contour. The gallbladder is underdistended. No ascites. Pancreas: Suboptimally evaluated on this noncontrast examination. Spleen: Normal noncontrast appearance of the spleen. Adrenals/Urinary Tract: Normal noncontrast appearance of the bilateral kidneys. No renal stones. No urine obstruction. Normal noncontrast appearance the bilateral adrenal glands. Urinary bladder was not imaged. Stomach/Bowel: The anterior wall of the stomach appears well apposed against the ventral abdominal wall without interposed hepatic parenchyma or transverse colon, likely suspected improvement in potential percutaneous access window with gastric insufflation. Enteric tube tip terminates within the proximal duodenum. Large colonic stool burden  without evidence of enteric obstruction. No pneumoperitoneum, pneumatosis or portal venous gas. Vascular/Lymphatic: Normal caliber of the abdominal aorta. No bulky retroperitoneal or mesenteric adenopathy on this noncontrast examination. Other: Regional soft tissues appear normal. Musculoskeletal: No acute or aggressive osseous abnormalities. IMPRESSION: 1. Gastric anatomy amenable to potential percutaneous gastrostomy tube placement as indicated. 2. Consolidative opacities with associated air bronchograms within the image right lower lobe with scattered ill-defined tree-in-bud opacities within the imaged left, nonspecific though could be seen in the setting infection and/or aspiration. Electronically Signed   By: Simonne ComeJohn  Watts M.D.   On: 08/31/2019 12:17   DG Abd 1 View  Result Date: 08/14/2019 CLINICAL DATA:  38 year old male status post feeding tube placement. EXAM: ABDOMEN - 1 VIEW COMPARISON:  Radiograph dated 08/05/2019. FINDINGS: Feeding tube with tip in the distal second portion of the duodenum. Contrast injected through the tube opacifies the second and third portions of the duodenum. IMPRESSION: Feeding tube with tip in the second portion of the duodenum. Electronically Signed   By: Elgie CollardArash  Radparvar M.D.   On: 08/14/2019 09:27   CT HEAD WO CONTRAST  Result Date: 08/17/2019 CLINICAL  DATA:  Weakness.  Encephalopathy. EXAM: CT HEAD WITHOUT CONTRAST TECHNIQUE: Contiguous axial images were obtained from the base of the skull through the vertex without intravenous contrast. COMPARISON:  Head CT 20 days ago.  Interval brain MRI FINDINGS: Brain: High-density areas within the bilateral cerebellum have resolved. In place are newly seen low-density areas with patchy distribution on both sides. The extent is similar to signal abnormality in the bilateral cerebellum on interval brain MRI. At the site of a large anterior subcortical left frontal lesion there is new high density which could be petechial  hemorrhage or mineralization. Suspect there is decreased mass effect at this level. Age advanced brain atrophy in the setting of HIV. No hematoma, hydrocephalus, or collection. Vascular: Negative Skull: Negative Sinuses/Orbits: Negative IMPRESSION: 1. No new process. 2. Evolving lesions of the cerebellum and left frontal lobe, extent of disease is underestimated relative to prior MRI. A Left frontal lesion is now mildly high density which could be from mineralization or petechial hemorrhage. 3. Atrophy. Electronically Signed   By: Marnee Spring M.D.   On: 08/17/2019 12:14   MR BRAIN W WO CONTRAST  Result Date: 08/21/2019 CLINICAL DATA:  Dizziness, weakness and headache. History of HIV. EXAM: MRI HEAD WITHOUT AND WITH CONTRAST TECHNIQUE: Multiplanar, multiecho pulse sequences of the brain and surrounding structures were obtained without and with intravenous contrast. CONTRAST:  4.52mL GADAVIST GADOBUTROL 1 MMOL/ML IV SOLN COMPARISON:  None. FINDINGS: BRAIN: There is multifocal abnormal diffusion restriction within both cerebral hemispheres, greatest along the cortices of the medial frontal lobes. There is widespread hyperintense T2-weighted signal abnormality, greatest in the cerebellum. These findings are new compared to the prior MRI. There is abnormal contrast enhancement in the bilateral frontal and left temporal lobes. There is no extra-axial collection. No midline shift or other mass effect. No hydrocephalus. VASCULAR: The major intracranial arterial and venous sinus flow voids are normal. Susceptibility-sensitive sequences show no chronic microhemorrhage or superficial siderosis. SKULL AND UPPER CERVICAL SPINE: Calvarial bone marrow signal is normal. There is no skull base mass. The visualized upper cervical spine and soft tissues are normal. SINUSES/ORBITS: There are no fluid levels or advanced mucosal thickening. The mastoid air cells and middle ear cavities are free of fluid. The orbits are normal.  IMPRESSION: 1. Multifocal signal abnormality and abnormal contrast enhancement within both cerebral and cerebellar hemispheres. In this clinical scenario, the findings are most consistent with infectious cerebritis, and the pattern is compatible with cerebral toxoplasmosis. 2. No discrete abscess, though the largest area of contrast enhancement in the left frontal lobe may be in the early stages of abscess formation. 3. No hemorrhage or mass effect. Electronically Signed   By: Deatra Robinson M.D.   On: 08/21/2019 22:33   IR GASTROSTOMY TUBE MOD SED  Result Date: 09/02/2019 INDICATION: 38 year old male with dysphagia EXAM: PERC PLACEMENT GASTROSTOMY MEDICATIONS: 2 g Ancef; Antibiotics were administered within 1 hour of the procedure. Scratch ANESTHESIA/SEDATION: Versed 0.5 mg IV; Fentanyl 25 mcg IV Moderate Sedation Time:  10 minutes The patient was continuously monitored during the procedure by the interventional radiology nurse under my direct supervision. CONTRAST:  5mL OMNIPAQUE IOHEXOL 300 MG/ML SOLN - administered into the gastric lumen. FLUOROSCOPY TIME:  Fluoroscopy Time: 1 minutes 6 seconds (6 mGy). COMPLICATIONS: None PROCEDURE: Informed written consent was obtained from the patient and the patient's family after a thorough discussion of the procedural risks, benefits and alternatives. All questions were addressed. Maximal Sterile Barrier Technique was utilized including caps, mask, sterile gowns,  sterile gloves, sterile drape, hand hygiene and skin antiseptic. A timeout was performed prior to the initiation of the procedure. The epigastrium was prepped with Betadine in a sterile fashion, and a sterile drape was applied covering the operative field. A sterile gown and sterile gloves were used for the procedure. A 5-French orogastric tube is placed under fluoroscopic guidance. Scout imaging of the abdomen confirms barium within the transverse colon. The stomach was distended with gas. Under fluoroscopic  guidance, an 18 gauge needle was utilized to puncture the anterior wall of the body of the stomach. An Amplatz wire was advanced through the needle passing a T fastener into the lumen of the stomach. The T fastener was secured for gastropexy. A 9-French sheath was inserted. A snare was advanced through the 9-French sheath. A Teena Dunk was advanced through the orogastric tube. It was snared then pulled out the oral cavity, pulling the snare, as well. The leading edge of the gastrostomy was attached to the snare. It was then pulled down the esophagus and out the percutaneous site. Tube secured in place. Contrast was injected. Patient tolerated the procedure well and remained hemodynamically stable throughout. No complications were encountered and no significant blood loss encountered. IMPRESSION: Status post fluoroscopic placed percutaneous gastrostomy tube, with 20 [place] pull-through. Signed, Yvone Neu. Loreta Ave, DO [facility] Rad[facility] Electronically Signed   By: Gilmer Mor D.O.   On: 09/02/2019 16:56   DG CHEST PORT 1 VIEW  Result Date: 08/28/2019 CLINICAL DATA:  Dyspnea, cough, congestion EXAM: PORTABLE CHEST 1 VIEW COMPARISON:  November 27 20 FINDINGS: The heart size and mediastinal contours are within normal limits. Mildly increased patchy density at the right lung base. No pleural effusion or pneumothorax. The visualized skeletal structures are unremarkable. Enteric tube passes into the stomach. IMPRESSION: Small patchy atelectasis/consolidation at the right base. Electronically Signed   By: Guadlupe Spanish M.D.   On: 08/28/2019 12:18   DG CHEST PORT 1 VIEW  Result Date: 08/18/2019 CLINICAL DATA:  Aspiration EXAM: PORTABLE CHEST 1 VIEW COMPARISON:  08/09/2019 chest radiograph. FINDINGS: Enteric tube enters stomach with the tip not seen on this image. stable cardiomediastinal silhouette with normal heart size. No pneumothorax. No pleural effusion. Lungs  appear clear, with no acute consolidative airspace disease and no pulmonary edema. IMPRESSION: 1. Enteric tube enters stomach with the tip not seen on this image. 2. No active cardiopulmonary disease. Electronically Signed   By: Delbert Phenix M.D.   On: 08/18/2019 10:26   DG CHEST PORT 1 VIEW  Result Date: 08/09/2019 CLINICAL DATA:  Low oxygen saturation. Possible aspiration. NG tube placement. EXAM: PORTABLE CHEST 1 VIEW COMPARISON:  08/03/2019 FINDINGS: Enteric tube in place with tip below the diaphragm, not included in the field of view. Normal heart size and mediastinal contours. No focal airspace disease, pulmonary edema, or pleural effusion. No pneumothorax. No acute osseous abnormalities are seen. Patient is slightly rotated. IMPRESSION: 1. Enteric tube in place with tip below the diaphragm, not included in the field of view. 2. No acute pulmonary findings. Electronically Signed   By: Narda Rutherford M.D.   On: 08/09/2019 15:14   DG Naso G Tube Plc W/Fl-No Rad  Result Date: 08/26/2019 OB/GYN ULTRASOUND: Please refer to the "Notes" tab to see imaging impression.   DG Swallowing Func-Speech Pathology  Result Date: 08/23/2019 Objective Swallowing Evaluation: Type of Study: MBS-Modified Barium Swallow Study  Patient Details Name: Arthur [name] MRN: [id number] Date of Birth: 08/13/1981 Today's Date:  08/23/2019 Time: SLP Start Time (ACUTE ONLY): 1335 -SLP Stop Time (ACUTE ONLY): 1400 SLP Time Calculation (min) (ACUTE ONLY): 25 min Past Medical History: Past Medical History: Diagnosis Date . Anemia  . Anxiety  . Depression  . GERD (gastroesophageal reflux disease)  . HIV infection (HCC)  . Substance abuse [facility])  Past Surgical History: Past Surgical History: Procedure Laterality Date . TOOTH EXTRACTION   HPI: Pt is a 38 y.o. male admitted with neurosyphillis, CNS toxoplasmosis. MRI 11/1 showed widespread areas concerning for infection, with most dominant abnormality in the L anterior frontal white  matter. Hospital course was complicated by hyponatremia and respiratory decline, transferred to ICU on 11/14 but not requiring intubation. Multiple CXRs this admission without acute findings. PMH: substance abuse, HIV, GERD, depression, anxiety, anemia  Subjective: pt upset after swallow study Assessment / Plan / Recommendation [facility] IP CLINICAL IMPRESSIONS 08/23/2019 Clinical Impression Pt presents with a severe oral dysphagia with resultant aspiration of thin liquids. Pt has incomplete labial seal, resulting in anterior bolus loss even when his head is in an upright position. SLP provided cues for increased lip seal and tried to administer boluses as far back in his oral cavity as possible in light of reduced mandibular opening. Although pt does have lingual movement, he does not have any posterior propulsion. In fact, at times when he was trying to transit the bolus posteriorly, his lingual movement would push the bolus more anteriorly to his lips and anterior sulcus. SLP provided larger boluses and various delivery methods to assist. Attempted reclining the chair slightly to allow for gravity to assist him with oral transit. Although this did help a small amount of thin liquid get to his pharynx, the liquid spilled passively and directly into his airway without eliciting a swallow. Pt did have a weak and congested cough that occurred spontaneously, suggestive of sensation of aspiration. Aside from the small amount that was aspirated, the remaining boluses, regardless of consistency, were either spilled passively out of his mouth or removed via suction. SLP reviewed results with patient and per his request, also called to share results with his mother. She would like to be present for future sessions whenever possible. I shared that we will try to coordinate this for this upcoming week. She is motivated to help him so I encouraged her to provide cues focusing on secretion management at this time, including trying  to swallow some of his saliva, closing his lips to keep it in his mouth, and trying to cough and use the yankuaer himself.  Will continue to follow and progress as able, but at the moment recommend that pt remain NPO with frequent oral care.  SLP Visit Diagnosis Dysphagia, oropharyngeal phase (R13.12) Attention and concentration deficit following -- Frontal lobe and executive function deficit following -- Impact on safety and function Severe aspiration risk;Risk for inadequate nutrition/hydration   [facility] IP TREATMENT RECOMMENDATION 08/23/2019 Treatment Recommendations Therapy as outlined in treatment plan below   Prognosis 08/23/2019 Prognosis for Safe Diet Advancement Good Barriers to Reach Goals Severity of deficits Barriers/Prognosis Comment -- [facility] IP DIET RECOMMENDATION 08/23/2019 SLP Diet Recommendations NPO Liquid Administration via -- Medication Administration Via alternative means Compensations -- Postural Changes --   [facility] IP OTHER RECOMMENDATIONS 08/23/2019 Recommended Consults -- Oral Care Recommendations Oral care QID Other Recommendations Have oral suction available   [facility] IP FOLLOW UP RECOMMENDATIONS 08/23/2019 Follow up Recommendations Inpatient Rehab   [facility] IP FREQUENCY AND DURATION 08/23/2019 Speech Therapy Frequency (ACUTE ONLY) min 2x/week Treatment  Duration 2 weeks      [facility] IP ORAL PHASE 08/23/2019 Oral Phase Impaired Oral - Pudding Teaspoon -- Oral - Pudding Cup -- Oral - Honey Teaspoon -- Oral - Honey Cup -- Oral - Nectar Teaspoon Weak lingual manipulation;Reduced posterior propulsion;Decreased bolus cohesion;Other (Comment) Oral - Nectar Cup -- Oral - Nectar Straw -- Oral - Thin Teaspoon Weak lingual manipulation;Reduced posterior propulsion;Decreased bolus cohesion;Other (Comment) Oral - Thin Cup -- Oral - Thin Straw Weak lingual manipulation;Reduced posterior propulsion;Decreased bolus cohesion;Other (Comment) Oral - Puree Weak lingual manipulation;Reduced posterior propulsion;Decreased bolus  cohesion;Other (Comment) Oral - Mech Soft -- Oral - Regular -- Oral - Multi-Consistency -- Oral - Pill -- Oral Phase - Comment --  [facility] IP PHARYNGEAL PHASE 08/23/2019 Pharyngeal Phase Impaired Pharyngeal- Pudding Teaspoon -- Pharyngeal -- Pharyngeal- Pudding Cup -- Pharyngeal -- Pharyngeal- Honey Teaspoon -- Pharyngeal -- Pharyngeal- Honey Cup -- Pharyngeal -- Pharyngeal- Nectar Teaspoon -- Pharyngeal -- Pharyngeal- Nectar Cup -- Pharyngeal -- Pharyngeal- Nectar Straw -- Pharyngeal -- Pharyngeal- Thin Teaspoon Penetration/Aspiration before swallow Pharyngeal -- Pharyngeal- Thin Cup -- Pharyngeal -- Pharyngeal- Thin Straw -- Pharyngeal -- Pharyngeal- Puree -- Pharyngeal -- Pharyngeal- Mechanical Soft -- Pharyngeal -- Pharyngeal- Regular -- Pharyngeal -- Pharyngeal- Multi-consistency -- Pharyngeal -- Pharyngeal- Pill -- Pharyngeal -- Pharyngeal Comment --  No flowsheet data found. Venita Sheffield Nix 08/23/2019, 3:23 PM  Pollyann Glen, M.A. [facility] Pager [phone] Office [phone]               Subjective: No acute events ON.  Discharge Exam: Vitals:   09/05/19 2022 09/06/19 0409  BP: 90/63 108/61  Pulse: 68 73  Resp: 18 18  Temp: 98 F (36.7 C) 97.8 F (36.6 C)  SpO2:  93%   Vitals:   09/05/19 0926 09/05/19 1703 09/05/19 2022 09/06/19 0409  BP: 92/64 (!) 86/59 90/63 108/61  Pulse: (!) 51 62 68 73  Resp: 14 16 18 18   Temp: (!) 97.5 F (36.4 C) 97.8 F (36.6 C) 98 F (36.7 C) 97.8 F (36.6 C)  TempSrc:  Oral Oral   SpO2: 96% 100%  93%  Weight:      Height:        General: 38 y.o. male resting in bed in NAD Cardiovascular: RRR, +s1s2, no m/g/r Respiratory: CTABL, no w/r/r, normal WOB GI: BS+, NDNT, PEG in place MSK: No e/c/c Neuro: alert, follows commands   The results of significant diagnostics from this hospitalization (including imaging, microbiology, ancillary and laboratory) are listed below for reference.     Microbiology: No results found for  this or any previous visit (from the past 240 hour(s)).   Labs: BNP (last 3 results) No results for input(s): BNP in the last 8760 hours. Basic Metabolic Panel: Recent Labs  Lab 09/02/19 0209 09/05/19 0511 09/06/19 0403  NA 135 137 134*  K 4.5 4.0 4.2  CL 106 104 104  CO2 24 23 22   GLUCOSE 88 104* 107*  BUN 16 12 13   CREATININE 0.74 0.95 0.76  CALCIUM 8.8* 8.8* 8.5*  MG  --  1.5* 1.6*  PHOS  --  3.3 2.8   Liver Function Tests: Recent Labs  Lab 09/02/19 0209 09/05/19 0511 09/06/19 0403  AST 37  --   --   ALT 34  --   --   ALKPHOS 80  --   --   BILITOT 0.2*  --   --   PROT 7.2  --   --   ALBUMIN 2.4* 2.2*  2.3*   No results for input(s): LIPASE, AMYLASE in the last 168 hours. No results for input(s): AMMONIA in the last 168 hours. CBC: Recent Labs  Lab 09/02/19 0209 09/05/19 0511 09/06/19 0403  WBC 1.9* 1.9* 1.8*  NEUTROABS 0.9* 0.9* 1.2*  HGB 10.8* 10.9* 10.6*  HCT 31.9* 33.5* 32.3*  MCV 91.1 96.3 94.4  PLT 246 216 215   Cardiac Enzymes: No results for input(s): CKTOTAL, CKMB, CKMBINDEX, TROPONINI in the last 168 hours. BNP: Invalid input(s): POCBNP CBG: Recent Labs  Lab 09/05/19 1151 09/05/19 1701 09/05/19 2022 09/06/19 0005 09/06/19 0407  GLUCAP 133* 102* 108* 91 101*   D-Dimer No results for input(s): DDIMER in the last 72 hours. Hgb A1c No results for input(s): HGBA1C in the last 72 hours. Lipid Profile No results for input(s): CHOL, HDL, LDLCALC, TRIG, CHOLHDL, LDLDIRECT in the last 72 hours. Thyroid function studies No results for input(s): TSH, T4TOTAL, T3FREE, THYROIDAB in the last 72 hours.  Invalid input(s): FREET3 Anemia work up No results for input(s): VITAMINB12, FOLATE, FERRITIN, TIBC, IRON, RETICCTPCT in the last 72 hours. Urinalysis    Component Value Date/Time   COLORURINE AMBER (A) 07/26/2019 1317   APPEARANCEUR CLEAR 07/26/2019 1317   LABSPEC 1.029 07/26/2019 1317   PHURINE 6.0 07/26/2019 1317   GLUCOSEU NEGATIVE  07/26/2019 1317   HGBUR SMALL (A) 07/26/2019 1317   BILIRUBINUR NEGATIVE 07/26/2019 1317   KETONESUR 20 (A) 07/26/2019 1317   PROTEINUR 30 (A) 07/26/2019 1317   UROBILINOGEN 1.0 03/06/2011 1601   NITRITE NEGATIVE 07/26/2019 1317   LEUKOCYTESUR NEGATIVE 07/26/2019 1317   Sepsis Labs Invalid input(s): PROCALCITONIN,  WBC,  LACTICIDVEN Microbiology No results found for this or any previous visit (from the past 240 hour(s)).   Time coordinating discharge: 35 minutes  SIGNED:   Teddy Spike, DO  [facility] 09/07/2019, 8:28 AM Pager   If 7PM-7AM, please contact night-coverage [email or web address] Password [email or web address]

## 2019-09-06 NOTE — Progress Notes (Signed)
Physical Therapy Treatment Patient Details Name: Arthur [name] MRN: [id number] DOB: 12/22/1980 Today's Date: 09/06/2019    History of Present Illness Pt is a 38 y.o. male admitted 07/28/19 with c/o worsening dizziness, headaches; has not taken HIV medications for ~1 yr due to afforability issues. Brain MRI consistent with opportunistic infection throughout both cerebral hemispheres. Pt also with lost R eye vision. Lumbar puncture 11/3. 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 Transfer to ICU 11/15 with worsening mental and respiratory status. PMH includes untreated HIV, depression, substance abuse.    PT Comments    Patient received in bed, reports he just threw up. Reports not feeling well. Agrees to bed exercises and sitting up on side of bed. Performed LE strengthening exercises with min guard and cues. Performed supine to sidelying to sit with min assist to raise trunk to seated position. Once seated patient able to maintain sitting independently with close supervision, cues to raise head to neutral. Had one lob to his right in sitting requiring mod assist to regain balance. Patient declined further activity at this time. He will continue to benefit from skilled PT while here to improve strength and functional independence.        Follow Up Recommendations  CIR;Supervision/Assistance - 24 hour     Equipment Recommendations  None recommended by PT    Recommendations for Other Services       Precautions / Restrictions Precautions Precautions: Fall Precaution Comments: Peg tube Restrictions Weight Bearing Restrictions: No    Mobility  Bed Mobility Overal bed mobility: Needs Assistance Bed Mobility: Supine to Sit;Sit to Sidelying     Supine to sit: Min guard;HOB elevated   Sit to sidelying: Min guard;HOB elevated    Transfers                 General transfer comment: not attempted today as patient declines. Reports he is not  feeling well and just vomited  Ambulation/Gait             General Gait Details: deferred today   Stairs             Wheelchair Mobility    Modified Rankin (Stroke Patients Only)       Balance Overall balance assessment: Needs assistance Sitting-balance support: Feet supported;Bilateral upper extremity supported Sitting balance-Leahy Scale: Fair Sitting balance - Comments: one lob falling to right in sitting, required mod assist to recover Postural control: Right lateral lean                                  Cognition Arousal/Alertness: Lethargic Behavior During Therapy: [facility] for tasks assessed/performed;Flat affect Overall Cognitive Status: No family/caregiver present to determine baseline cognitive functioning Area of Impairment: Following commands;Problem solving;Awareness;Attention;Safety/judgement                   Current Attention Level: Selective   Following Commands: Follows one step commands with increased time Safety/Judgement: Decreased awareness of safety Awareness: Emergent Problem Solving: Slow processing;Requires verbal cues;Requires tactile cues;Decreased initiation;Difficulty sequencing General Comments: pt able to verbalize when prompted, soft volume      Exercises Other Exercises Other Exercises: B LE exercises in supine: heel slides, hip abd/add, SLR x5-10 reps, min guidance for proper technique    General Comments        Pertinent Vitals/Pain Pain Assessment: No/denies pain    Home Living  Prior Function            PT Goals (current goals can now be found in the care plan section) Acute Rehab PT Goals Patient Stated Goal: none stated today Progress towards PT goals: Progressing toward goals    Frequency    Min 3X/week      PT Plan Current plan remains appropriate    Co-evaluation              AM-PAC PT "6 Clicks" Mobility   Outcome Measure  Help needed  turning from your back to your side while in a flat bed without using bedrails?: A Little Help needed moving from lying on your back to sitting on the side of a flat bed without using bedrails?: A Little Help needed moving to and from a bed to a chair (including a wheelchair)?: A Lot Help needed standing up from a chair using your arms (e.g., wheelchair or bedside chair)?: A Little Help needed to walk in hospital room?: A Lot Help needed climbing 3-5 steps with a railing? : Total 6 Click Score: 14    End of Session   Activity Tolerance: Patient limited by fatigue;Other (comment)(not feeling well) Patient left: in bed;with bed alarm set;with call bell/phone within reach Nurse Communication: Mobility status PT Visit Diagnosis: Muscle weakness (generalized) (M62.81);Other abnormalities of gait and mobility (R26.89);Difficulty in walking, not elsewhere classified (R26.2)     Time: 1250-1305 PT Time Calculation (min) (ACUTE ONLY): 15 min  Charges:  $Therapeutic Activity: 8-22 mins                     Victor Granados, PT, GCS 09/06/19,1:17 PM

## 2019-09-06 NOTE — TOC Progression Note (Signed)
Transition of Care [facility]) - Progression Note    Patient Details  Name: Arthur [name] MRN: [id number] Date of Birth: 1981/01/20  Transition of Care [facility]) CM/SW [place] MSN, RN, NCM-BC, [place] [phone] Phone Number: 09/06/2019, 2:45 PM  Clinical Narrative:    Patient will have a bed available at [facility] on 12/12. The rehab [facility] following to coordinate admission.    Expected Discharge Plan: [place] Services Barriers to Discharge: Other (comment)(CIR bed available on 12/12)  Expected Discharge Plan and Services Expected Discharge Plan: [place] In-house Referral: NA   Post Acute Care Choice: IP Rehab Living arrangements for the past 2 months: Single Family Home                           HH Arranged: PT, OT HH Agency: [facility] Date [place]: 07/30/19 Time [place]: 1442 Representative spoke with at [place]: [name]   Social Determinants of Health ([place]) Interventions    Readmission Risk Interventions No flowsheet data found.

## 2019-09-06 NOTE — Progress Notes (Signed)
Inpatient Rehabilitation Admissions Coordinator  Discussed with Dr. Marylyn Ishihara. Inpatient rehab bed is available to admit pt to on Saturday. I spoke with patient at bedside and then contacted his Mom, [name]. They are in agreement. Dr. Naaman Plummer, rehab MD, will assess patient in the morning and then 31M RN can call report to unit 4000 Charge RN at [phone] at noon. Pt can then admit to Inpt rehab/CIR when bed available on Saturday. I will make the arrangements.  Danne Baxter, RN, MSN Rehab Admissions Coordinator [phone] 09/06/2019 12:09 PM

## 2019-09-06 NOTE — Progress Notes (Signed)
[name]  PROGRESS NOTE    Arthur [name]  OMV:[id number] DOB: [birth year] DOA: 07/28/2019 PCP: Randall Hiss, MD   Brief Narrative:   38 year old male with history of HIV, substance abuse, depression and GERD presented on 07/28/2019 with dizziness, weakness and intermittent headache with noncompliance to his HIV medications for about 1 year due to affordability issue. MRI of the brain in the ED showed widespread areas of abnormal low level restricted diffusion and postcontrast enhancement throughout both cerebral hemispheres, predominantly infratentorial with the dominant abnormality in the left anterior frontal white matter consistent with an opportunistic infection, such as toxoplasmosis, tuberculosis or fungal cerebritis/meningitis, or parasitic infection. He was admitted; ID was consulted. He then revealed that he had lost his vision in his right eye. He was started on penicillin and ganciclovir for possible CMV retinitis versus synovitis. Ophthalmology was consulted. 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  09/04/19: hypoglycemic this AM. TFs started per PEG 09/05/19: hypotensive ON. hypopglycemic ON 09/06/19: BP ok ON and glucose was stable. Ok for transfer to [facility].No beds available today. Will try again tomorrow.    Assessment & Plan:   Principal Problem:   Toxoplasmosis Active Problems:   Neurosyphilis   Nausea and vomiting   Acquired immunodeficiency syndrome (HCC)   Bradycardia   Hyponatremia   Vision loss   Abnormal brain MRI   Palliative care encounter   Thrush of mouth and esophagus (HCC)   Goals of care, counseling/discussion   Protein-calorie malnutrition, severe   Weakness generalized   Dysphagia   Aspiration into airway   Palliative care by specialist   DNR (do not resuscitate) discussion  CNS toxoplasmosis AIDS/untreated HIV Oral thrush - on pyrimethamine-leucovorin, sulfadiazine.  - continue induction phase  treatment for total of 6 weeks, then switch to chronic phase as per ID. - pt close to d/c; have reached out to ID for follow up instructions, appreciate assistance - continue tivicay plus Descovy - continue Diflucan through today. - continue Bactrim for prophylaxis - CSF was positive for VDRL. Patient has completed penicillin treatment - Positive for CMV: Initially treated with ganciclovir which has subsequently been discontinued with negative CNS PCR -TB Gold plus negative. MTB probe negative. RIPE treatment stopped     - Per ID: Received notification today that Mr. Bernier will be transitioning to CIR. He will need to continue current dose of pyrimethamine-leucovorin, sulfadiazine, Bactrim, and Tivicay and Descovy indefinitely. His initial treatment course is scheduled to end on 09/26/19 and will continue with maintenance at that point. Spoke with ID pharmacy and medications have been ordered for duration of hospitalization and rehabilitation. Please notify ID pharmacy ~ 1 week prior to discharge from CIR to coordinate set up of home medications. He has follow up in the ID office already scheduled on 09/30/19 with Dr. [name].     - 09/06/19: He is stable for transfer to CIR  Acute toxic/metabolic encephalopathy - Multifactorial including CNS infection and SIADH - Repeat CT head without new process, evolving lesions of cerebellum and left frontal lobe - MRI brain with multifocal signal abnormality and abnormal contrast-enhancement, most consistent with infectious cerebritis, consistent with cerebral toxoplasmosis, no discrete abscess - More interactive than the last time I saw him; says he's thirsty, following commands - 09/06/19: He is stable for transfer to CIR  Right eye blindness, optic retinitis/neuritis - Patient has been evaluated by ophthalmology.  - He received steroid injection on 08/02/2019 - Repeat MRI brain and orbits  show improvement of brain lesions and optic neuritis, retinitis  Anemia of chronic disease - Hemoglobin stable. 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  - Was treated with 3% saline and IV Lasix.  - Nephrology consulted; appreciate assistance, have now s/o'd - Resolved  Hypotension - asymptomatic - SBPs down into the 70's ON - no signs of new infection - will add midodrine; monitor     - 09/06/19: BP good ON; he is stable for transfer to CIR  Acute kidney injury - Resolved; monitor  Dysphagia Severe protein calorie malnutrition/AIDS wasting syndrome Hypoglycemia - TF per dietitian.  - SLP following and is still recommending n.p.o. - s/p PEG tube placement by IR on 09/02/2019; TF per dietician - 09/05/19: PEG feeds on continuous; glucose starting to pick up, monitor     - 09/06/19: His glucose was good ON; ok for transfer to CIR     - RD recommendations: Tube feeding:         - Osmolite 1.5 @20  ml/hr via PEG          - Increase by 10 ml Q4 hours to goal rate of 6365ml/hr (1560 ml/day)         - Pro-stat 30 ml daily         - Free water per MD, currently 100 ml TID         - Tube feeding regimenand current free waterflushes provide 2440kcal, 113grams of protein, and 1489ml of H2O (meeting >100% of kcal needs, 100% of protein needs).  No beds in CIR. Will try again tomorrow.   DVT prophylaxis: lovenox Code Status: FULL   Disposition Plan: To CIR when bed available  Consultants:   PCCM  Palliative Care  Ophthalmology  ID  Nephrology  Antimicrobials:  . descovy, tivicay, diflucan, pyrimethamine-leucovorin, bactrim, lamisil, sulfadiazine   Subjective: No acute events ON.   Objective: Vitals:   09/05/19 1703 09/05/19 2022 09/06/19 0409 09/06/19 0932  BP: (!) 86/59 90/63 108/61 90/60  Pulse: 62 68 73 78  Resp: 16  18 18 18   Temp: 97.8 F (36.6 C) 98 F (36.7 C) 97.8 F (36.6 C) 97.9 F (36.6 C)  TempSrc: Oral Oral  Oral  SpO2: 100%  93% 98%  Weight:      Height:        Intake/Output Summary (Last 24 hours) at 09/06/2019 1320 Last data filed at 09/06/2019 [id number] Gross per 24 hour  Intake 0 ml  Output 1050 ml  Net -1050 ml   Filed Weights   09/02/19 0500 09/03/19 0441 09/04/19 0413  Weight: 54.4 kg 49.8 kg 48.3 kg    Examination:  General: 38 y.o. male resting in bed in NAD Cardiovascular: regular, +s1s1, no m/g/r, equal pulses throughout Respiratory: CTABL, no w/r/r GI: BS+, NDNT, no masses noted, no organomegaly noted, PEG noted MSK: No e/c/c Neuro: somnolent   Data Reviewed: I have personally reviewed following labs and imaging studies.  CBC: Recent Labs  Lab 09/02/19 0209 09/05/19 0511 09/06/19 0403  WBC 1.9* 1.9* 1.8*  NEUTROABS 0.9* 0.9* 1.2*  HGB 10.8* 10.9* 10.6*  HCT 31.9* 33.5* 32.3*  MCV 91.1 96.3 94.4  PLT 246 216 215   Basic Metabolic Panel: Recent Labs  Lab 09/02/19 0209 09/05/19 0511 09/06/19 0403  NA 135 137 134*  K 4.5 4.0 4.2  CL 106 104 104  CO2 24 23 22   GLUCOSE 88 104* 107*  BUN  16 12 13   CREATININE 0.74 0.95 0.76  CALCIUM 8.8* 8.8* 8.5*  MG  --  1.5* 1.6*  PHOS  --  3.3 2.8   GFR: Estimated Creatinine Clearance: 87.2 mL/min (by C-G formula based on SCr of 0.76 mg/dL). Liver Function Tests: Recent Labs  Lab 09/02/19 0209 09/05/19 0511 09/06/19 0403  AST 37  --   --   ALT 34  --   --   ALKPHOS 80  --   --   BILITOT 0.2*  --   --   PROT 7.2  --   --   ALBUMIN 2.4* 2.2* 2.3*   No results for input(s): LIPASE, AMYLASE in the last 168 hours. No results for input(s): AMMONIA in the last 168 hours. Coagulation Profile: Recent Labs  Lab 09/02/19 0209  INR 1.0   Cardiac Enzymes: No results for input(s): CKTOTAL, CKMB, CKMBINDEX, TROPONINI in the last 168 hours. BNP (last 3 results) No results for input(s): PROBNP in the last  8760 hours. HbA1C: No results for input(s): HGBA1C in the last 72 hours. CBG: Recent Labs  Lab 09/05/19 2022 09/06/19 0005 09/06/19 0407 09/06/19 0751 09/06/19 1126  GLUCAP 108* 91 101* 92 88   Lipid Profile: No results for input(s): CHOL, HDL, LDLCALC, TRIG, CHOLHDL, LDLDIRECT in the last 72 hours. Thyroid Function Tests: No results for input(s): TSH, T4TOTAL, FREET4, T3FREE, THYROIDAB in the last 72 hours. Anemia Panel: No results for input(s): VITAMINB12, FOLATE, FERRITIN, TIBC, IRON, RETICCTPCT in the last 72 hours. Sepsis Labs: No results for input(s): PROCALCITON, LATICACIDVEN in the last 168 hours.  No results found for this or any previous visit (from the past 240 hour(s)).    Radiology Studies: No results found.   Scheduled Meds: . dolutegravir  50 mg Oral Daily  . emtricitabine-tenofovir AF  1 tablet Oral Daily  . enoxaparin (LOVENOX) injection  40 mg Subcutaneous Q24H  . famotidine  20 mg Per Tube Daily  . feeding supplement (PRO-STAT SUGAR FREE 64)  30 mL Per Tube Daily  . free water  100 mL Per Tube Q8H  . insulin aspart  0-9 Units Subcutaneous Q4H  . mouth rinse  15 mL Mouth Rinse BID  . midodrine  2.5 mg Oral TID WC  . multivitamin  15 mL Per Tube Daily  . Pyrimethamine-Leucovorin  50 mg Per Tube Daily  . sulfaDIAZINE  1,000 mg Per Tube Q6H  . sulfamethoxazole-trimethoprim  20 mL Per Tube 3 times weekly  . terbinafine   Topical Daily   Continuous Infusions: . chlorproMAZINE (THORAZINE) IV 50 mL/hr at 09/01/19 1500  . feeding supplement (OSMOLITE 1.5 CAL) 1,000 mL (09/06/19 0638)     LOS: 39 days    Time spent: 25 minutes spent in the coordination of care today.    Jonnie Finner, DO [facility] Pager [phone]  If 7PM-7AM, please contact night-coverage [email or web address] Password [email or web address] 09/06/2019, 1:20 PM

## 2019-09-06 NOTE — Progress Notes (Signed)
  Speech Language Pathology Treatment: Dysphagia  Patient Details Name: Arthur [name] MRN: [id number] DOB: [birth year] Today's Date: 09/06/2019 Time: 5465-6812 SLP Time Calculation (min) (ACUTE ONLY): 18 min  Assessment / Plan / Recommendation Clinical Impression  Pt was seen for skilled ST targeting dysphagia goals.  Pt was able to reposition himself to a more upright seated position in bed using bed rails prior to initiation of PO trials with SLP assist to reposition hips to prevent further leaning.  Oral care was provided to minimize bacterial load and infection risk during PO trials.  Vocal quality was clear and strong with prompting although pt's spontaneous verbalizations remained very quiet and difficult to understand.  Baseline cough prior to POs was congested and guarded due to ongoing soreness around PEG site.  Following oral care, pt consumed 4 ice chips with delayed coughing noted after the third trial.  No overt s/s of aspiration were evident across 6 small teaspoons of jello.  Recommend that pt remain NPO with alternative means of nutrition while also undergoing intensive dysphagia treatment targeting remediation.  Pt was left in bed with bed alarm set and call bell within reach.  Continue per current plan of care.     HPI HPI: Pt is a 38 y.o. male admitted with neurosyphillis, CNS toxoplasmosis. MRI 11/1 showed widespread areas concerning for infection, with most dominant abnormality in the L anterior frontal white matter. Hospital course was complicated by hyponatremia and respiratory decline, transferred to ICU on 11/14 but not requiring intubation. Multiple CXRs this admission without acute findings. PMH: substance abuse, HIV, GERD, depression, anxiety, anemia      SLP Plan  Continue with current plan of care       Recommendations  Diet recommendations: NPO Medication Administration: Via alternative means                Oral Care Recommendations: Oral care QID Follow  up Recommendations: Inpatient Rehab SLP Visit Diagnosis: Dysphagia, oropharyngeal phase (R13.12) Plan: Continue with current plan of care       GO                PageSelinda Orion 09/06/2019, 9:56 AM

## 2019-09-07 ENCOUNTER — Inpatient Hospital Stay (HOSPITAL_COMMUNITY)
Admission: RE | Admit: 2019-09-07 | Discharge: 2019-09-27 | DRG: 977 | Disposition: A | Discharge status: Home Health | Payer: Medicaid Other | Source: Intra-hospital | Attending: Physical Medicine & Rehabilitation | Admitting: Physical Medicine & Rehabilitation

## 2019-09-07 ENCOUNTER — Other Ambulatory Visit: Payer: Self-pay

## 2019-09-07 ENCOUNTER — Encounter (HOSPITAL_COMMUNITY): Payer: Self-pay | Admitting: Physical Medicine & Rehabilitation

## 2019-09-07 DIAGNOSIS — K5900 Constipation, unspecified: Secondary | ICD-10-CM | POA: Diagnosis present

## 2019-09-07 DIAGNOSIS — R5381 Other malaise: Principal | ICD-10-CM | POA: Diagnosis present

## 2019-09-07 DIAGNOSIS — Z9114 Patient's other noncompliance with medication regimen: Secondary | ICD-10-CM

## 2019-09-07 DIAGNOSIS — I959 Hypotension, unspecified: Secondary | ICD-10-CM | POA: Diagnosis present

## 2019-09-07 DIAGNOSIS — D72819 Decreased white blood cell count, unspecified: Secondary | ICD-10-CM | POA: Diagnosis not present

## 2019-09-07 DIAGNOSIS — R1312 Dysphagia, oropharyngeal phase: Secondary | ICD-10-CM | POA: Diagnosis present

## 2019-09-07 DIAGNOSIS — D702 Other drug-induced agranulocytosis: Secondary | ICD-10-CM | POA: Diagnosis not present

## 2019-09-07 DIAGNOSIS — B582 Toxoplasma meningoencephalitis: Secondary | ICD-10-CM | POA: Diagnosis not present

## 2019-09-07 DIAGNOSIS — B20 Human immunodeficiency virus [HIV] disease: Secondary | ICD-10-CM | POA: Diagnosis not present

## 2019-09-07 DIAGNOSIS — F418 Other specified anxiety disorders: Secondary | ICD-10-CM | POA: Diagnosis present

## 2019-09-07 DIAGNOSIS — F0280 Dementia in other diseases classified elsewhere without behavioral disturbance: Secondary | ICD-10-CM | POA: Diagnosis not present

## 2019-09-07 DIAGNOSIS — B589 Toxoplasmosis, unspecified: Secondary | ICD-10-CM | POA: Diagnosis present

## 2019-09-07 DIAGNOSIS — Z931 Gastrostomy status: Secondary | ICD-10-CM

## 2019-09-07 DIAGNOSIS — K5901 Slow transit constipation: Secondary | ICD-10-CM | POA: Diagnosis not present

## 2019-09-07 DIAGNOSIS — R0989 Other specified symptoms and signs involving the circulatory and respiratory systems: Secondary | ICD-10-CM

## 2019-09-07 DIAGNOSIS — E43 Unspecified severe protein-calorie malnutrition: Secondary | ICD-10-CM | POA: Diagnosis present

## 2019-09-07 DIAGNOSIS — I952 Hypotension due to drugs: Secondary | ICD-10-CM | POA: Diagnosis not present

## 2019-09-07 DIAGNOSIS — I9589 Other hypotension: Secondary | ICD-10-CM | POA: Diagnosis not present

## 2019-09-07 DIAGNOSIS — R7309 Other abnormal glucose: Secondary | ICD-10-CM | POA: Diagnosis not present

## 2019-09-07 DIAGNOSIS — F17210 Nicotine dependence, cigarettes, uncomplicated: Secondary | ICD-10-CM | POA: Diagnosis present

## 2019-09-07 DIAGNOSIS — R918 Other nonspecific abnormal finding of lung field: Secondary | ICD-10-CM | POA: Diagnosis not present

## 2019-09-07 DIAGNOSIS — Z681 Body mass index (BMI) 19 or less, adult: Secondary | ICD-10-CM | POA: Diagnosis not present

## 2019-09-07 DIAGNOSIS — K219 Gastro-esophageal reflux disease without esophagitis: Secondary | ICD-10-CM | POA: Diagnosis present

## 2019-09-07 DIAGNOSIS — J181 Lobar pneumonia, unspecified organism: Secondary | ICD-10-CM | POA: Diagnosis not present

## 2019-09-07 DIAGNOSIS — E162 Hypoglycemia, unspecified: Secondary | ICD-10-CM | POA: Diagnosis present

## 2019-09-07 DIAGNOSIS — R1310 Dysphagia, unspecified: Secondary | ICD-10-CM | POA: Diagnosis not present

## 2019-09-07 DIAGNOSIS — R4189 Other symptoms and signs involving cognitive functions and awareness: Secondary | ICD-10-CM | POA: Diagnosis present

## 2019-09-07 DIAGNOSIS — E871 Hypo-osmolality and hyponatremia: Secondary | ICD-10-CM | POA: Diagnosis not present

## 2019-09-07 DIAGNOSIS — A523 Neurosyphilis, unspecified: Secondary | ICD-10-CM | POA: Diagnosis not present

## 2019-09-07 DIAGNOSIS — D62 Acute posthemorrhagic anemia: Secondary | ICD-10-CM | POA: Diagnosis not present

## 2019-09-07 DIAGNOSIS — D72818 Other decreased white blood cell count: Secondary | ICD-10-CM | POA: Diagnosis not present

## 2019-09-07 LAB — GLUCOSE, CAPILLARY
Glucose-Capillary: 102 mg/dL — ABNORMAL HIGH (ref 70–99)
Glucose-Capillary: 112 mg/dL — ABNORMAL HIGH (ref 70–99)
Glucose-Capillary: 135 mg/dL — ABNORMAL HIGH (ref 70–99)
Glucose-Capillary: 67 mg/dL — ABNORMAL LOW (ref 70–99)
Glucose-Capillary: 87 mg/dL (ref 70–99)
Glucose-Capillary: 94 mg/dL (ref 70–99)

## 2019-09-07 LAB — MAGNESIUM: Magnesium: 1.8 mg/dL (ref 1.7–2.4)

## 2019-09-07 MED ORDER — PRO-STAT SUGAR FREE PO LIQD: 30.0000 mL | Freq: Every day | ORAL | Status: DC

## 2019-09-07 MED ORDER — DOLUTEGRAVIR SODIUM 50 MG PO TABS
50.0000 mg | ORAL_TABLET | Freq: Every day | ORAL | Status: DC
  Administered 2019-09-08 – 2019-09-27 (×20): 50 mg via ORAL
  Filled 2019-09-07 (×20): qty 1

## 2019-09-07 MED ORDER — LOPERAMIDE HCL 1 MG/7.5ML PO SUSP
2.0000 mg | ORAL | Status: DC | PRN
  Filled 2019-09-07: qty 15

## 2019-09-07 MED ORDER — PROCHLORPERAZINE EDISYLATE 10 MG/2ML IJ SOLN: 5.0000 mg | Freq: Four times a day (QID) | INTRAMUSCULAR | Status: DC | PRN

## 2019-09-07 MED ORDER — SULFADIAZINE 500 MG PO TABS
1000.0000 mg | ORAL_TABLET | Freq: Four times a day (QID) | ORAL | Status: DC
  Administered 2019-09-07 – 2019-09-25 (×72): 1000 mg
  Filled 2019-09-07 (×73): qty 2

## 2019-09-07 MED ORDER — FAMOTIDINE 40 MG/5ML PO SUSR
20.0000 mg | Freq: Every day | ORAL | Status: DC
  Administered 2019-09-08 – 2019-09-27 (×20): 20 mg
  Filled 2019-09-07 (×20): qty 2.5

## 2019-09-07 MED ORDER — ENOXAPARIN SODIUM 40 MG/0.4ML ~~LOC~~ SOLN
40.0000 mg | SUBCUTANEOUS | Status: DC
  Administered 2019-09-08 – 2019-09-27 (×20): 40 mg via SUBCUTANEOUS
  Filled 2019-09-07 (×20): qty 0.4

## 2019-09-07 MED ORDER — TERBINAFINE HCL 1 % EX CREA
1.0000 "application " | TOPICAL_CREAM | Freq: Every day | CUTANEOUS | Status: DC
  Administered 2019-09-08 – 2019-09-27 (×19): 1 via TOPICAL
  Filled 2019-09-07 (×4): qty 12

## 2019-09-07 MED ORDER — BISACODYL 10 MG RE SUPP: 10.0000 mg | Freq: Every day | RECTAL | Status: DC | PRN

## 2019-09-07 MED ORDER — INSULIN ASPART 100 UNIT/ML ~~LOC~~ SOLN
0.0000 [IU] | SUBCUTANEOUS | Status: DC
  Administered 2019-09-07: 1 [IU] via SUBCUTANEOUS

## 2019-09-07 MED ORDER — PROCHLORPERAZINE MALEATE 5 MG PO TABS: 5.0000 mg | ORAL_TABLET | Freq: Four times a day (QID) | ORAL | Status: DC | PRN

## 2019-09-07 MED ORDER — FREE WATER
100.0000 mL | Freq: Three times a day (TID) | Status: DC
  Administered 2019-09-07 – 2019-09-27 (×58): 100 mL

## 2019-09-07 MED ORDER — DEXTROSE 50 % IV SOLN
INTRAVENOUS | Status: AC
  Filled 2019-09-07: qty 50

## 2019-09-07 MED ORDER — ADULT MULTIVITAMIN LIQUID CH
15.0000 mL | Freq: Every day | ORAL | Status: DC
  Administered 2019-09-08 – 2019-09-26 (×20): 15 mL
  Filled 2019-09-07 (×20): qty 15

## 2019-09-07 MED ORDER — PROCHLORPERAZINE 25 MG RE SUPP: 12.5000 mg | Freq: Four times a day (QID) | RECTAL | Status: DC | PRN

## 2019-09-07 MED ORDER — ALUM & MAG HYDROXIDE-SIMETH 200-200-20 MG/5ML PO SUSP: 30.0000 mL | ORAL | Status: DC | PRN

## 2019-09-07 MED ORDER — IPRATROPIUM-ALBUTEROL 0.5-2.5 (3) MG/3ML IN SOLN: 3.0000 mL | RESPIRATORY_TRACT | Status: DC | PRN

## 2019-09-07 MED ORDER — TRAZODONE HCL 50 MG PO TABS
25.0000 mg | ORAL_TABLET | Freq: Every evening | ORAL | Status: DC | PRN
  Administered 2019-09-08: 50 mg via ORAL
  Filled 2019-09-07: qty 1

## 2019-09-07 MED ORDER — FLEET ENEMA 7-19 GM/118ML RE ENEM: 1.0000 | ENEMA | Freq: Once | RECTAL | Status: DC | PRN

## 2019-09-07 MED ORDER — MIDODRINE HCL 5 MG PO TABS
2.5000 mg | ORAL_TABLET | Freq: Three times a day (TID) | ORAL | Status: DC
  Administered 2019-09-07 – 2019-09-11 (×11): 2.5 mg
  Filled 2019-09-07 (×12): qty 1

## 2019-09-07 MED ORDER — MAGNESIUM OXIDE 400 (241.3 MG) MG PO TABS
200.0000 mg | ORAL_TABLET | Freq: Two times a day (BID) | ORAL | Status: DC
  Administered 2019-09-07 – 2019-09-13 (×12): 200 mg
  Filled 2019-09-07 (×12): qty 1

## 2019-09-07 MED ORDER — GLUCAGON HCL RDNA (DIAGNOSTIC) 1 MG IJ SOLR
1.0000 mg | INTRAMUSCULAR | Status: AC
  Administered 2019-09-07: 1 mg via INTRAMUSCULAR
  Filled 2019-09-07: qty 1

## 2019-09-07 MED ORDER — ONDANSETRON HCL 4 MG/2ML IJ SOLN
4.0000 mg | Freq: Four times a day (QID) | INTRAMUSCULAR | Status: DC | PRN
  Administered 2019-09-08 – 2019-09-09 (×2): 4 mg via INTRAVENOUS
  Filled 2019-09-07 (×3): qty 2

## 2019-09-07 MED ORDER — SODIUM CHLORIDE 0.9 % IV SOLN
12.5000 mg | Freq: Three times a day (TID) | INTRAVENOUS | Status: DC | PRN
  Filled 2019-09-07: qty 0.5

## 2019-09-07 MED ORDER — PRO-STAT SUGAR FREE PO LIQD
30.0000 mL | Freq: Every day | ORAL | Status: DC
  Administered 2019-09-08 – 2019-09-09 (×2): 30 mL
  Filled 2019-09-07 (×2): qty 30

## 2019-09-07 MED ORDER — POLYETHYLENE GLYCOL 3350 17 G PO PACK: 17.0000 g | PACK | Freq: Every day | ORAL | Status: DC | PRN

## 2019-09-07 MED ORDER — GUAIFENESIN-DM 100-10 MG/5ML PO SYRP: 5.0000 mL | ORAL_SOLUTION | Freq: Four times a day (QID) | ORAL | Status: DC | PRN

## 2019-09-07 MED ORDER — ONDANSETRON HCL 4 MG PO TABS
4.0000 mg | ORAL_TABLET | Freq: Four times a day (QID) | ORAL | Status: DC | PRN
  Administered 2019-09-09 – 2019-09-26 (×3): 4 mg via ORAL
  Filled 2019-09-07 (×3): qty 1

## 2019-09-07 MED ORDER — ACETAMINOPHEN 325 MG PO TABS
325.0000 mg | ORAL_TABLET | ORAL | Status: DC | PRN
  Administered 2019-09-09 – 2019-09-23 (×2): 650 mg via ORAL
  Filled 2019-09-07 (×4): qty 2

## 2019-09-07 MED ORDER — PYRIMETHAMINE-LEUCOVORIN 50-25 MG PO CAPS
50.0000 mg | ORAL_CAPSULE | Freq: Every day | ORAL | Status: DC
  Administered 2019-09-07 – 2019-09-26 (×20): 50 mg
  Filled 2019-09-07 (×22): qty 1

## 2019-09-07 MED ORDER — PHENOL 1.4 % MT LIQD: 1.0000 | OROMUCOSAL | Status: DC | PRN

## 2019-09-07 MED ORDER — TRAMADOL HCL 50 MG PO TABS
50.0000 mg | ORAL_TABLET | Freq: Four times a day (QID) | ORAL | Status: DC | PRN
  Administered 2019-09-07 – 2019-09-26 (×9): 50 mg
  Filled 2019-09-07 (×9): qty 1

## 2019-09-07 MED ORDER — DIPHENHYDRAMINE HCL 12.5 MG/5ML PO ELIX: 12.5000 mg | ORAL_SOLUTION | Freq: Four times a day (QID) | ORAL | Status: DC | PRN

## 2019-09-07 MED ORDER — ORAL CARE MOUTH RINSE
15.0000 mL | Freq: Two times a day (BID) | OROMUCOSAL | Status: DC
  Administered 2019-09-07 – 2019-09-27 (×25): 15 mL via OROMUCOSAL

## 2019-09-07 MED ORDER — SULFAMETHOXAZOLE-TRIMETHOPRIM 200-40 MG/5ML PO SUSP
20.0000 mL | ORAL | Status: DC
  Administered 2019-09-09 – 2019-09-27 (×9): 20 mL
  Filled 2019-09-07 (×9): qty 20

## 2019-09-07 MED ORDER — OSMOLITE 1.5 CAL PO LIQD
1000.0000 mL | ORAL | Status: DC
  Administered 2019-09-07 – 2019-09-08 (×3): 1000 mL
  Filled 2019-09-07 (×4): qty 1000

## 2019-09-07 MED ORDER — EMTRICITABINE-TENOFOVIR AF 200-25 MG PO TABS
1.0000 | ORAL_TABLET | Freq: Every day | ORAL | Status: DC
  Administered 2019-09-08 – 2019-09-27 (×20): 1 via ORAL
  Filled 2019-09-07 (×20): qty 1

## 2019-09-07 NOTE — Progress Notes (Addendum)
Patient arrived to unit approximately 1530. Patient's mother at bedside.   Skin issues noted:  Possible stage 2 to Rt side of sacrum with peeling skin around edges. New foam applied. Measuring 1cmX0.5cm  Small raised bump "crater" to right forearm with black scab at center 0.5cmx0.5cm. No drainage-open to air  Bilateral feet with hard dry and thick cracked skin covering bottom of feet up over heels.

## 2019-09-07 NOTE — Progress Notes (Signed)
Messaged NP on call making aware of CBG of 67. Pt has lost both IVs and is NPO. RN getting a new IV and will give half amp of D 50 and recheck CBG.   Eleanora Neighbor, RN

## 2019-09-07 NOTE — H&P (Addendum)
Physical Medicine and Rehabilitation Admission H&P        Chief Complaint  Patient presents with  . Functional deficits due to toxoplasmosis   . Weakness      HPI:  Arthur [name] is a 38 year old male with history of HIV, substance abuse, depression, GERD who presented to ED on 07/28/19 with dizziness, weakness and intermittent headaches. Patient had been noncompliant with his HIV drugs due to lack of insurance/affordibility issues. MRI brain done revealing revealing widespread areas of diffusion abnormal contrast in both cerebral hemispheres predominantly left anterior frontal matter concerning for opportunistic infection as well as premature generalized atrophy. 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  ID consulted for input and patient started on  pyrimethamine-Leucovorin as well as sulfadiazine for 6 weeks treatment--end date 09/26/19.  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  Dr. Sherrine Maples consulted for input and felt that patient with optic neuropathy with retinitis OD     Follow up MRI brain showing slow response to treatment.  Esophageal candidiasis treated with IV fluconazole and ART added 11/30 without signs of IRIS.  Palliative care consulted to discuss goals of care and mother elected on aggressive treatment.  He has been maintained on tube feeds with NPO due to severe dysphagia with difficulty handing oral secretions.  PEG recommended for nutritional support and as mother was agreeable,  this was placed on 12/07 by Dr. Loreta Ave. 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 Encephalopathy with confusion resolving and he is showing improvement in activity tolerance. CIR recommended  due to functional deficits.      Review of Systems  Constitutional: Negative for chills and fever.  HENT: Negative for hearing loss and tinnitus.   Eyes: Positive for blurred vision.  Respiratory: Positive for cough and sputum production.   Cardiovascular: Negative for chest pain and palpitations.  Gastrointestinal: Positive for abdominal pain and diarrhea. Negative for heartburn and nausea.  Musculoskeletal: Negative for back pain and myalgias.  Skin: Negative for itching and rash.  Neurological: Positive for speech change and focal weakness. Negative for dizziness and headaches.          Past Medical History:  Diagnosis Date  . Anemia    . Anxiety    . Depression    . GERD (gastroesophageal reflux disease)    . HIV infection (HCC)    . Substance abuse [facility])             Past Surgical History:  Procedure Laterality Date  . IR GASTROSTOMY TUBE MOD SED   09/02/2019  . TOOTH EXTRACTION               Family History  Problem Relation Age of Onset  . Diabetes Neg Hx    . CAD Neg Hx    . 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 he reports that he has been smoking. He has a 6.80 pack-year smoking history. He has never used smokeless tobacco. He reports current drug use. Drug: Marijuana. He reports that he does not drink alcohol.      Allergies: No Known Allergies  Medications Prior to Admission  Medication Sig Dispense Refill  . ondansetron (ZOFRAN ODT) 8 MG disintegrating tablet Take 1 tablet (8 mg total) by mouth every 8 (eight) hours as needed for nausea or vomiting. 20 tablet 0  . ritonavir (NORVIR) 100 MG TABS tablet Take 1 tablet (100 mg total) by mouth daily. (Patient not taking: Reported on 01/22/2018) 30 tablet 5  . TRUVADA 200-300 MG per tablet TAKE 1 TABLET BY MOUTH DAILY (Patient not taking: Reported on 01/22/2018) 30 tablet 0      Drug Regimen Review  Drug regimen was reviewed and remains  appropriate with no significant issues identified   Home: Home Living Family/patient expects to be discharged to:: Private residence Living Arrangements: Other relatives Available Help at Discharge: Family, Available PRN/intermittently Type of Home: Apartment Home Access: Level entry Home Layout: One level Bathroom Shower/Tub: Nurse, adult Accessibility: Yes Home Equipment: None   Functional History: Prior Function Level of Independence: Independent   Functional Status:  Mobility: Bed Mobility Overal bed mobility: Needs Assistance Bed Mobility: Supine to Sit Rolling: Mod assist Sidelying to sit: Max assist Supine to sit: Min assist, HOB elevated(bed rails) Sit to supine: Mod assist, HOB elevated General bed mobility comments: max cuing & extra time with some assist to upright self vs R lateral lean Transfers Overall transfer level: Needs assistance Equipment used: Rolling walker (2 wheeled) Transfer via Lift Equipment: Stedy Transfers: Sit to/from Stand Sit to Stand: Min assist, +2 safety/equipment Stand pivot transfers: Mod assist, +2 safety/equipment General transfer comment: assist to place L hand on walker, min assist to rise and steady Ambulation/Gait Ambulation/Gait assistance: +2 physical assistance, +2 safety/equipment, Min assist Gait Distance (Feet): 40 Feet Assistive device: Rolling walker (2 wheeled) Gait Pattern/deviations: Decreased step length - right, Decreased step length - left, Narrow base of support, Trunk flexed, Scissoring, Decreased stride length General Gait Details: intermittent scissoring gait, up to heavy assistance at times for managing RW & turning with AD Gait velocity: decreased Gait velocity interpretation: <1.31 ft/sec, indicative of household ambulator   ADL: ADL Overall ADL's : Needs assistance/impaired Eating/Feeding: NPO Grooming: Oral care, Sitting, Moderate assistance Grooming Details (indicate cue type and reason):  difficulty spitting, used suction Upper Body Bathing: Moderate assistance, Bed level Upper Body Bathing Details (indicate cue type and reason): pt using R hand to wash chest and L underarm. pt using bil Ue to apply deordorant to each arm pit. pt total (A) for R arm and under arm. Pt with deep breath after task  Lower Body Bathing: Minimal assistance, Sit to/from stand Upper Body Dressing : Moderate assistance, Sitting Upper Body Dressing Details (indicate cue type and reason): to don front opening gown Lower Body Dressing: Total assistance, Sit to/from stand Lower Body Dressing Details (indicate cue type and reason): socks Toilet Transfer: Minimal assistance, +2 for physical assistance, +2 for safety/equipment, RW, Ambulation Toilet Transfer Details (indicate cue type and reason): simulated to recliner  Toileting- Clothing Manipulation and Hygiene: Minimal assistance Functional mobility during ADLs: Minimal assistance, +2 for physical assistance, +2 for safety/equipment, Rolling walker, Cueing for safety, Cueing for sequencing General ADL Comments: pt limited by impaired cognition, weakness, balance and activity tolerance   Cognition: Cognition Overall Cognitive Status: Impaired/Different from baseline Orientation Level: Oriented X4 Cognition Arousal/Alertness: Awake/alert Behavior During Therapy: WFL for tasks assessed/performed Overall Cognitive Status: Impaired/Different from baseline Area of Impairment: Following commands, Problem solving, Awareness, Attention, Safety/judgement Current Attention Level: Selective Following Commands: Follows one step commands consistently, Follows one step commands  with increased time, Follows multi-step commands inconsistently Safety/Judgement: Decreased awareness of safety Awareness: Emergent Problem Solving: Slow processing, Requires verbal cues, Requires tactile cues, Decreased initiation, Difficulty sequencing General Comments: pt able to verbalize  when prompted, soft volume Difficult to assess due to: Impaired communication(pt encouraged to verbalize & was able to do so (short phrases) 3x during session)     Blood pressure 92/64, pulse (!) 51, temperature (!) 97.5 F (36.4 C), resp. rate 14, height 5\' 11"  (1.803 m), weight 48.3 kg, SpO2 96 %. Physical Exam  Nursing note and vitals reviewed. Constitutional: He is oriented to person, place, and time. He appears cachectic.  Frail appearing. Lying in bed  HENT:  Head: Normocephalic and atraumatic.  Eyes: Pupils are equal, round, and reactive to light. Left eye exhibits no discharge.  Neck: No tracheal deviation present. No thyromegaly present.  Cardiovascular: Normal rate and regular rhythm.  Respiratory: Effort normal and breath sounds normal.  A few rhonchi and upper airway sounds  GI: Soft. There is abdominal tenderness.  PEG  Musculoskeletal:        General: No edema.     Cervical back: Normal range of motion.  Neurological: He is alert and oriented to person, place, and time.  Dysarthric speech, dysphonic. Delayed processing. Minimal verbalization. Moves all 4's. Delayed processing. Senses pain. Limited insight and awareness  Skin: Skin is warm and dry.  Bilateral feet with onychomycotic toe nails and bilateral feet with ichthyosis.   Psychiatric:  Flat, withdrawn      Lab Results Last 48 Hours        Results for orders placed or performed during the hospital encounter of 07/28/19 (from the past 48 hour(s))  Glucose, capillary     Status: Abnormal    Collection Time: 09/03/19 11:57 AM  Result Value Ref Range    Glucose-Capillary 64 (L) 70 - 99 mg/dL  Glucose, capillary     Status: Abnormal    Collection Time: 09/03/19  1:00 PM  Result Value Ref Range    Glucose-Capillary 120 (H) 70 - 99 mg/dL  Glucose, capillary     Status: Abnormal    Collection Time: 09/03/19  3:41 PM  Result Value Ref Range    Glucose-Capillary 57 (L) 70 - 99 mg/dL  Glucose, capillary      Status: Abnormal    Collection Time: 09/03/19  4:41 PM  Result Value Ref Range    Glucose-Capillary 108 (H) 70 - 99 mg/dL  Glucose, capillary     Status: None    Collection Time: 09/03/19  8:03 PM  Result Value Ref Range    Glucose-Capillary 72 70 - 99 mg/dL  Glucose, capillary     Status: None    Collection Time: 09/04/19 12:00 AM  Result Value Ref Range    Glucose-Capillary 77 70 - 99 mg/dL  Glucose, capillary     Status: Abnormal    Collection Time: 09/04/19  4:14 AM  Result Value Ref Range    Glucose-Capillary 68 (L) 70 - 99 mg/dL  Glucose, capillary     Status: None    Collection Time: 09/04/19  6:53 AM  Result Value Ref Range    Glucose-Capillary 97 70 - 99 mg/dL  Glucose, capillary     Status: Abnormal    Collection Time: 09/04/19  7:50 AM  Result Value Ref Range    Glucose-Capillary 66 (L) 70 - 99 mg/dL  Glucose, capillary     Status: Abnormal    Collection Time: 09/04/19 11:26  AM  Result Value Ref Range    Glucose-Capillary 55 (L) 70 - 99 mg/dL  Glucose, capillary     Status: Abnormal    Collection Time: 09/04/19  4:11 PM  Result Value Ref Range    Glucose-Capillary 68 (L) 70 - 99 mg/dL  Glucose, capillary     Status: Abnormal    Collection Time: 09/04/19  7:55 PM  Result Value Ref Range    Glucose-Capillary 67 (L) 70 - 99 mg/dL  Glucose, capillary     Status: Abnormal    Collection Time: 09/04/19  8:30 PM  Result Value Ref Range    Glucose-Capillary 157 (H) 70 - 99 mg/dL  Glucose, capillary     Status: None    Collection Time: 09/04/19  8:53 PM  Result Value Ref Range    Glucose-Capillary 95 70 - 99 mg/dL  Glucose, capillary     Status: None    Collection Time: 09/04/19 11:45 PM  Result Value Ref Range    Glucose-Capillary 80 70 - 99 mg/dL  Glucose, capillary     Status: None    Collection Time: 09/05/19  3:45 AM  Result Value Ref Range    Glucose-Capillary 89 70 - 99 mg/dL  CBC with Differential/Platelet     Status: Abnormal    Collection Time: 09/05/19   5:11 AM  Result Value Ref Range    WBC 1.9 (L) 4.0 - 10.5 K/uL    RBC 3.48 (L) 4.22 - 5.81 MIL/uL    Hemoglobin 10.9 (L) 13.0 - 17.0 g/dL    HCT 16.133.5 (L) 09.639.0 - 52.0 %    MCV 96.3 80.0 - 100.0 fL    MCH 31.3 26.0 - 34.0 pg    MCHC 32.5 30.0 - 36.0 g/dL    RDW 04.517.0 (H) 40.911.5 - 15.5 %    Platelets 216 150 - 400 K/uL    nRBC 1.0 (H) 0.0 - 0.2 %    Neutrophils Relative % 43 %    Neutro Abs 0.9 (L) 1.7 - 7.7 K/uL    Lymphocytes Relative 32 %    Lymphs Abs 0.6 (L) 0.7 - 4.0 K/uL    Monocytes Relative 21 %    Monocytes Absolute 0.4 0.1 - 1.0 K/uL    Eosinophils Relative 2 %    Eosinophils Absolute 0.0 0.0 - 0.5 K/uL    Basophils Relative 1 %    Basophils Absolute 0.0 0.0 - 0.1 K/uL    Immature Granulocytes 1 %    Abs Immature Granulocytes 0.01 0.00 - 0.07 K/uL      Comment: Performed at [facility], [address] [place], [place] [address]  Magnesium     Status: Abnormal    Collection Time: 09/05/19  5:11 AM  Result Value Ref Range    Magnesium 1.5 (L) 1.7 - 2.4 mg/dL      Comment: Performed at [facility], [address] [place], [place] [address]  Renal function panel     Status: Abnormal    Collection Time: 09/05/19  5:11 AM  Result Value Ref Range    Sodium 137 135 - 145 mmol/L    Potassium 4.0 3.5 - 5.1 mmol/L    Chloride 104 98 - 111 mmol/L    CO2 23 22 - 32 mmol/L    Glucose, Bld 104 (H) 70 - 99 mg/dL    BUN 12 6 - 20 mg/dL    Creatinine, Ser 5.620.95 0.61 - 1.24 mg/dL  Calcium 8.8 (L) 8.9 - 10.3 mg/dL    Phosphorus 3.3 2.5 - 4.6 mg/dL    Albumin 2.2 (L) 3.5 - 5.0 g/dL    GFR calc non Af Amer >60 >60 mL/min    GFR calc Af Amer >60 >60 mL/min    Anion gap 10 5 - 15      Comment: Performed at [facility], [address] [place], [place] [address]  Glucose, capillary     Status: Abnormal    Collection Time: 09/05/19  7:53 AM  Result Value Ref Range    Glucose-Capillary 64 (L) 70 - 99 mg/dL  Glucose, capillary     Status: None    Collection Time:  09/05/19  9:28 AM  Result Value Ref Range    Glucose-Capillary 98 70 - 99 mg/dL      Imaging Results (Last 48 hours)  No results found.           Medical Problem List and Plan: 1.  Functional and cognitive deficits secondary to CNS toxoplasmosis             -patient may may shower             -ELOS/Goals: 12-16 days supervision to min assist 2.  Antithrombotics: -DVT/anticoagulation:  Pharmaceutical: Lovenox             -antiplatelet therapy: N/A 3. Pain Management: Tramadol or Tylenol prn for abdominal pain.  4. Mood: LCSW to follow for evaluation and support             -antipsychotic agents: N/A 5. Neuropsych: This patient is not fully capable of making decisions on his own behalf. 6. Skin/Wound Care: routine pressure relief measures 7. Fluids/Electrolytes/Nutrition: Monitor I/O. Check lytes on 11/14             -osmolite plus supps thru PEG  -NPO 8. 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  9. CNS toxoplasmosis: On Pyrimethamine-Leucovorin and Sulfadiazine thorough 12/30. 10. Malnutrition/Severe dysphagia: Continue NPO. ON Osmolite with prostat--RD to follow for input.  11. Hypomagnesemia: IV supplement as needed. Will add supplement thru peg. Recheck levels.           Jacquelynn Cree, PA-C 09/05/2019  I have personally performed a face to face diagnostic evaluation of this patient and formulated the key components of the plan.  Additionally, I have personally reviewed laboratory data, imaging studies, as well as relevant notes and concur with the physician assistant's documentation above.  The patient's status has not changed from the original H&P.  Any changes in documentation from the acute care chart have been noted above.  Ranelle Oyster, MD, [place] [facility]

## 2019-09-08 ENCOUNTER — Inpatient Hospital Stay (HOSPITAL_COMMUNITY): Payer: Self-pay | Admitting: Speech Pathology

## 2019-09-08 ENCOUNTER — Inpatient Hospital Stay (HOSPITAL_COMMUNITY): Payer: Self-pay

## 2019-09-08 ENCOUNTER — Inpatient Hospital Stay (HOSPITAL_COMMUNITY): Payer: Self-pay | Admitting: Occupational Therapy

## 2019-09-08 LAB — COMPREHENSIVE METABOLIC PANEL
ALT: 34 U/L (ref 0–44)
AST: 35 U/L (ref 15–41)
Albumin: 2.4 g/dL — ABNORMAL LOW (ref 3.5–5.0)
Alkaline Phosphatase: 80 U/L (ref 38–126)
Anion gap: 8 (ref 5–15)
BUN: 14 mg/dL (ref 6–20)
CO2: 24 mmol/L (ref 22–32)
Calcium: 8.8 mg/dL — ABNORMAL LOW (ref 8.9–10.3)
Chloride: 102 mmol/L (ref 98–111)
Creatinine, Ser: 0.88 mg/dL (ref 0.61–1.24)
GFR calc Af Amer: >60 mL/min (ref >60)
GFR calc non Af Amer: >60 mL/min (ref >60)
Glucose, Bld: 107 mg/dL — ABNORMAL HIGH (ref 70–99)
Potassium: 4.5 mmol/L (ref 3.5–5.1)
Sodium: 134 mmol/L — ABNORMAL LOW (ref 135–145)
Total Bilirubin: 0.5 mg/dL (ref 0.3–1.2)
Total Protein: 6.9 g/dL (ref 6.5–8.1)

## 2019-09-08 LAB — GLUCOSE, CAPILLARY
Glucose-Capillary: 105 mg/dL — ABNORMAL HIGH (ref 70–99)
Glucose-Capillary: 105 mg/dL — ABNORMAL HIGH (ref 70–99)
Glucose-Capillary: 110 mg/dL — ABNORMAL HIGH (ref 70–99)
Glucose-Capillary: 116 mg/dL — ABNORMAL HIGH (ref 70–99)
Glucose-Capillary: 118 mg/dL — ABNORMAL HIGH (ref 70–99)
Glucose-Capillary: 67 mg/dL — ABNORMAL LOW (ref 70–99)
Glucose-Capillary: 73 mg/dL (ref 70–99)
Glucose-Capillary: 82 mg/dL (ref 70–99)

## 2019-09-08 LAB — CBC WITH DIFFERENTIAL/PLATELET
Abs Immature Granulocytes: 0.12 10*3/uL — ABNORMAL HIGH (ref 0.00–0.07)
Basophils Absolute: 0 10*3/uL (ref 0.0–0.1)
Basophils Relative: 1 %
Eosinophils Absolute: 0 10*3/uL (ref 0.0–0.5)
Eosinophils Relative: 0 %
HCT: 35.1 % — ABNORMAL LOW (ref 39.0–52.0)
Hemoglobin: 11.5 g/dL — ABNORMAL LOW (ref 13.0–17.0)
Immature Granulocytes: 5 %
Lymphocytes Relative: 49 %
Lymphs Abs: 1.1 10*3/uL (ref 0.7–4.0)
MCH: 31.6 pg (ref 26.0–34.0)
MCHC: 32.8 g/dL (ref 30.0–36.0)
MCV: 96.4 fL (ref 80.0–100.0)
Monocytes Absolute: 0.3 10*3/uL (ref 0.1–1.0)
Monocytes Relative: 15 %
Neutro Abs: 0.7 10*3/uL — ABNORMAL LOW (ref 1.7–7.7)
Neutrophils Relative %: 30 %
Platelets: 207 10*3/uL (ref 150–400)
RBC: 3.64 MIL/uL — ABNORMAL LOW (ref 4.22–5.81)
RDW: 17.3 % — ABNORMAL HIGH (ref 11.5–15.5)
WBC: 2.2 10*3/uL — ABNORMAL LOW (ref 4.0–10.5)
nRBC: 0 % (ref 0.0–0.2)

## 2019-09-08 MED ORDER — DEXTROSE 50 % IV SOLN: 12.5000 g | INTRAVENOUS | Status: AC

## 2019-09-08 MED ORDER — DEXTROSE 50 % IV SOLN
INTRAVENOUS | Status: AC
  Administered 2019-09-08: 12.5 g via INTRAVENOUS
  Filled 2019-09-08: qty 50

## 2019-09-08 NOTE — Evaluation (Addendum)
Occupational Therapy Assessment and Plan  Patient Details  Name: Arthur [name] MRN: [id number] Date of Birth: 1980-10-23  OT Diagnosis: abnormal posture, ataxia, cognitive deficits, disturbance of vision, hemiplegia affecting non-dominant side and muscle weakness (generalized) Rehab Potential: Rehab Potential (ACUTE ONLY): Good ELOS: 14-16 days   Today's Date: 09/08/2019 OT Individual Time: 1100-1200 OT Individual Time Calculation (min): 60 min     Problem List:  Patient Active Problem List   Diagnosis Date Noted  . Debility 09/07/2019  . Aspiration into airway   . Palliative care by specialist   . DNR (do not resuscitate) discussion   . Weakness generalized   . Dysphagia   . Protein-calorie malnutrition, severe 08/19/2019  . 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 Goals of care, counseling/discussion   . Bacterial meningitis   . Thrush of mouth and esophagus ([place])   . Palliative care encounter   . HIV (human immunodeficiency virus infection) ([place]) 07/29/2019  . Vision loss 07/29/2019  . Abnormal brain MRI 07/29/2019  . Acquired immunodeficiency syndrome ([place]) 07/28/2019  . Bradycardia 07/28/2019  . Hyponatremia 07/28/2019  . Nausea and vomiting 03/25/2013  . Muscle spasm 03/25/2013  . Drug use 01/11/2012  . 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 COUGH 06/23/2009  . CHEST PAIN, PLEURITIC 06/23/2009  . FUNGAL DERMATITIS 05/28/2008  . GERD 05/28/2008  . DENTAL CARIES 02/13/2008  . NICOTINE ADDICTION 11/14/2007  . BACK STRAIN, LUMBAR 08/22/2007  . 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 WEIGHT LOSS, RECENT 05/01/2007  . ANXIETY DEPRESSION 02/14/2007  . HIV DISEASE 02/13/2007  . CERVICAL LYMPHADENOPATHY, ANTERIOR, RIGHT 02/13/2007    Past Medical History:  Past Medical History:  Diagnosis Date  . Anemia   . Anxiety   . Depression   . GERD (gastroesophageal reflux disease)   . HIV infection ([place])   . Substance abuse [facility])    Past Surgical History:  Past Surgical History:   Procedure Laterality Date  . IR GASTROSTOMY TUBE MOD SED  09/02/2019  . TOOTH EXTRACTION      Assessment & Plan Clinical Impression:  Arthur [name] is a 38 year old male with history of HIV, substance abuse, depression, GERD who presented to ED on 07/28/19 with dizziness, weakness and intermittent headaches. Patient had been noncompliant with his HIV drugs due to lack of insurance/affordibility issues. 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 Dr. 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 Esophageal candidiasis treated with IV fluconazole and ART added 11/30 without signs of IRIS. Palliative care consulted to discuss goals of care and mother elected on aggressive treatment. He has been maintained on tube feeds with NPO due to severe dysphagia with difficulty handing oral secretions. PEG recommended for nutritional support and as motherwasagreeable,this was placed on 12/07by Dr. Earleen Newport. 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 Encephalopathy with confusion resolving and he is showing improvement in activity tolerance. CIR recommended  due to functional deficits  Patient  currently requires mod with basic self-care skills secondary to muscle weakness, decreased cardiorespiratoy endurance, unbalanced muscle activation, ataxia and decreased coordination, visual field loss in Rt eye, decreased problem solving, decreased safety awareness, decreased memory and delayed processing and decreased sitting balance, decreased standing balance, decreased postural control and hemiplegia.  Prior to hospitalization, patient could complete BADLs with independent .  Patient will benefit from skilled intervention to increase independence with basic self-care skills prior to discharge home with sister.  Anticipate patient will require 24 hour supervision and minimal physical assistance and follow up home health.  OT - End of Session Endurance Deficit: Yes Endurance Deficit Description: Pt initiated several semi-reclined rest breaks in bed during session OT Assessment Rehab Potential (ACUTE ONLY): Good OT Barriers to Discharge: Home environment access/layout;Weight;Medical stability;Other (comments) OT Barriers to Discharge Comments: PEG OT Patient demonstrates impairments in the following area(s): Balance;Nutrition;Safety;Cognition;Skin Integrity;Endurance;Vision;Motor OT Basic ADL's Functional Problem(s): Eating;Grooming;Bathing;Dressing;Toileting OT Advanced ADL's Functional Problem(s): Simple Meal Preparation OT Transfers Functional Problem(s): Toilet;Tub/Shower OT Additional Impairment(s): Fuctional Use of Upper Extremity OT Plan OT Intensity: Minimum of 1-2 x/day, 45 to 90 minutes OT Frequency: 5 out of 7 days OT Duration/Estimated Length of Stay: 14-16 days OT Treatment/Interventions: Balance/vestibular training;Discharge planning;Pain management;Self Care/advanced ADL retraining;Therapeutic Activities;UE/LE Coordination activities;Visual/perceptual remediation/compensation;Therapeutic Exercise;Skin care/wound managment;Patient/family education;Functional mobility  training;Disease mangement/prevention;Cognitive remediation/compensation;Community reintegration;Neuromuscular re-education;DME/adaptive equipment instruction;Psychosocial support;UE/LE Strength taining/ROM;Wheelchair propulsion/positioning OT Self Feeding Anticipated Outcome(s): no goal OT Basic Self-Care Anticipated Outcome(s): Supervision OT Toileting Anticipated Outcome(s): Min A OT Bathroom Transfers Anticipated Outcome(s): Supervision-Min A OT Recommendation Recommendations for Other Services: Therapeutic Recreation consult Therapeutic Recreation Interventions: Other (comment)(leisure pursuits/poetry writing) Patient destination: Home Follow Up Recommendations: Home health OT Equipment Recommended: To be determined   Skilled Therapeutic Intervention Skilled OT session completed with focus on initial evaluation, education on OT role/POC, and establishment of patient-centered goals.   Pt greeted in bed, denying pain. Agreeable to sit EOB to engage in BADLs. Supervision for supine<sit with HOB elevated. Note that pt had trunk sway and a few Rt LOBs onto the bed while sitting unsupported. Decreased proprioceptive awareness with L UE during UB self care and grooming tasks. Min A for sitting balance while he oriented and donned overhead shirt. Mod A for sit<stand and Min-Mod A for dynamic standing balance during LB self care. Pt problem solved while donning gripper socks by propping feet up on the bed. He also initiated pulling pants up via bridging but ultimately needed A for full elevation. Stand pivot<BSC completed using RW with Mod A for power up and Min A for stepping with vcs for widening BOS due to ataxia. At end of tx pt returned to bed and was left with all needs within reach, 4 bedrails up per request, bed alarm set.   OT Evaluation Precautions/Restrictions  Precautions Precautions: Fall Precaution Comments: PEG, foley, visual field loss Rt eye Restrictions Weight Bearing  Restrictions: No General Chart Reviewed: Yes Family/Caregiver Present: No Pain Pain Assessment Pain Scale: 0-10 Pain Score: 0-No pain Home Living/Prior Functioning Home Living Family/patient expects to be discharged to:: Private residence Living Arrangements: Other relatives Available Help at Discharge: Family, Friend(s), Available 24 hours/day Type of Home: Apartment Home Access: Stairs to enter Technical brewer of Steps: 6 Home Layout: Two level Alternate Level Stairs-Number of Steps: 12 Bathroom Shower/Tub: Government social research officer Accessibility: Yes  Lives With: Other (Comment)(will d/c home with sister) IADL History Homemaking Responsibilities: Yes(Pt reports he took care of all IADL responsibilities  PTA, was living alone) 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 Function Level of Independence: Independent with basic ADLs, Independent with homemaking with ambulation Driving: No(per pt, his sister took care of driving needs) Vocation: Full time employment([employer]) ADL ADL Eating: NPO Grooming: Moderate assistance Where Assessed-Grooming: Bed level, Edge of bed Upper Body Bathing: Supervision/safety Where Assessed-Upper Body Bathing: Edge of bed Lower Body Bathing: Moderate assistance Where Assessed-Lower Body Bathing: Edge of bed Upper Body Dressing: Minimal assistance Where Assessed-Upper Body Dressing: Edge of bed Lower Body Dressing: Maximal assistance Where Assessed-Lower Body Dressing: Edge of bed Toileting: Not assessed Toilet Transfer: Moderate assistance Toilet Transfer Method: Stand pivot Toilet Transfer Equipment: Radiographer, therapeutic: Not assessed Vision Baseline Vision/History: No visual deficits Patient Visual Report: Other (comment)(Pt reports complete visual field loss in Rt eye) Vision Assessment?: Vision impaired- to be further tested in functional  context Perception  Perception: Within Functional Limits Praxis Praxis: Intact Cognition Overall Cognitive Status: Impaired/Different from baseline Arousal/Alertness: Awake/alert Orientation Level: Person;Place Year: 2016 Month: December Day of Week: Correct Memory: Impaired Memory Impairment: Decreased recall of new information;Retrieval deficit Immediate Memory Recall: Sock;Blue;Bed Memory Recall Sock: With Cue Memory Recall Blue: Without Cue Memory Recall Bed: Without Cue Attention: Alternating Awareness: Impaired Awareness Impairment: Emergent impairment;Anticipatory impairment Problem Solving: Impaired Problem Solving Impairment: Functional complex Executive Function: Initiating Initiating: Impaired Initiating Impairment: Functional basic;Verbal basic Safety/Judgment: Impaired Sensation Coordination Gross Motor Movements are Fluid and Coordinated: No Fine Motor Movements are Fluid and Coordinated: No Coordination and Movement Description: Ataxic with deficits in [facility] + hand strength bilaterally Finger Nose Finger Test: Ataxic Lt>Rt Motor  Motor Motor: Hemiplegia;Ataxia;Abnormal postural alignment and control Mobility    Mod A stand pivot<BSC using RW Trunk/Postural Assessment  Cervical Assessment Cervical Assessment: (forward head) Thoracic Assessment Thoracic Assessment: Exceptions to WFL(rounded shoulders, scapular protrusion bilaterally) Postural Control Postural Control: Deficits on evaluation(impaired during bathing/dressing tasks EOB, trunk sway with LOBs towards the Rt. Note Lt lean while semi-reclined in bed)  Balance Balance Balance Assessed: Yes Dynamic Sitting Balance Dynamic Sitting - Balance Support: No upper extremity supported;During functional activity Dynamic Sitting - Level of Assistance: 4: Min assist(attempting to don gripper socks EOB) Dynamic Standing Balance Dynamic Standing - Level of Assistance: 4: Min assist Dynamic Standing - Balance  Activities: Lateral lean/weight shifting;Forward lean/weight shifting(elevating pants over hips) Extremity/Trunk Assessment RUE Assessment RUE Assessment: Within Functional Limits Active Range of Motion (AROM) Comments: WNL General Strength Comments: 4-/5 grossly LUE Assessment LUE Assessment: Exceptions to [facility] Active Range of Motion (AROM) Comments: <150 degrees shoulder flexion + abduction General Strength Comments: 3+/5 grossly     Refer to Care Plan for Long Term Goals  Recommendations for other services: Neuropsych and Therapeutic Recreation  Other leisure pursuits/poetry writing   Discharge Criteria: Patient will be discharged from OT if patient refuses treatment 3 consecutive times without medical reason, if treatment goals not met, if there is a change in medical status, if patient makes no progress towards goals or if patient is discharged from hospital.  The above assessment, treatment plan, treatment alternatives and goals were discussed and mutually agreed upon: by patient  Skeet Simmer 09/08/2019, 12:42 PM

## 2019-09-08 NOTE — Plan of Care (Signed)
  Problem: Consults Goal: RH GENERAL PATIENT EDUCATION Description: See Patient Education module for education specifics. Outcome: Progressing   Problem: RH BOWEL ELIMINATION Goal: RH STG MANAGE BOWEL WITH ASSISTANCE Description: STG Manage Bowel with min Assistance. Outcome: Progressing   Problem: RH BLADDER ELIMINATION Goal: RH STG MANAGE BLADDER WITH ASSISTANCE Description: STG Manage Bladder With min Assistance Outcome: Progressing   Problem: RH SKIN INTEGRITY Goal: RH STG SKIN FREE OF INFECTION/BREAKDOWN Description: No new skin breakdown Outcome: Progressing Goal: RH STG MAINTAIN SKIN INTEGRITY WITH ASSISTANCE Description: STG Maintain Skin Integrity With min Assistance. Outcome: Progressing Goal: RH STG ABLE TO PERFORM INCISION/WOUND CARE W/ASSISTANCE Description: STG Able To Perform Incision/Wound Care With min Assistance. Outcome: Progressing   Problem: RH SAFETY Goal: RH STG ADHERE TO SAFETY PRECAUTIONS W/ASSISTANCE/DEVICE Description: STG Adhere to Safety Precautions With min Assistance/Device. Outcome: Progressing   Problem: RH KNOWLEDGE DEFICIT GENERAL Goal: RH STG INCREASE KNOWLEDGE OF SELF CARE AFTER HOSPITALIZATION Outcome: Progressing

## 2019-09-08 NOTE — Progress Notes (Addendum)
Hypoglycemic Event  CBG: 67  Treatment: IV dextrose 50% solution 12.5g  Symptoms: asymptomatic   Follow-up CBG: Time: 0412 CBG Result: 118  Possible Reasons for Event:   Comments/MD notified: followed hypoglycemic protocol     Chilton Si

## 2019-09-08 NOTE — Progress Notes (Signed)
[facility] PHYSICAL MEDICINE & REHABILITATION PROGRESS NOTE   Subjective/Complaints: No issues overnight. cbg low this morning though  ROS: limited d/t cognition.    Objective:   No results found. Recent Labs    09/06/19 0403 09/08/19 0533  WBC 1.8* 2.2*  HGB 10.6* 11.5*  HCT 32.3* 35.1*  PLT 215 207   Recent Labs    09/06/19 0403 09/08/19 0533  NA 134* 134*  K 4.2 4.5  CL 104 102  CO2 22 24  GLUCOSE 107* 107*  BUN 13 14  CREATININE 0.76 0.88  CALCIUM 8.5* 8.8*    Intake/Output Summary (Last 24 hours) at 09/08/2019 1015 Last data filed at 09/08/2019 0800 Gross per 24 hour  Intake --  Output 1025 ml  Net -1025 ml     Physical Exam: Vital Signs Blood pressure 92/67, pulse 68, temperature 97.8 F (36.6 C), temperature source Oral, resp. rate 18, SpO2 96 %. Constitutional: No distress . Vital signs reviewed. Frail HEENT: EOMI, oral membranes moist Neck: supple Cardiovascular: RRR without murmur. No JVD    Respiratory: a few scattered rhonchi. Normal effort    GI: BS +, non-tender, non-distended, PEG Musculoskeletal:  General: No edema.  Cervical back: Normal range of motion.  Neurological: He isalertand oriented to person, place, and time. Dysarthric speech. Delayed processing. Minimal verbalization. Moves all 4's 3-4/5. Delayed processing. Senses pain. Limited insight and awareness Skin: Skin iswarmand dry. Bilateral feet with onychomycotic toe nails and bilateral feet with ichthyosis. Psychiatric:  Flat, doesn't initiate much    Assessment/Plan: 1. 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  Physiatrist is providing close team supervision and 24 hour management of active medical problems listed below.  Physiatrist and rehab team continue to assess barriers to discharge/monitor patient progress toward functional and  medical goals  Care Tool:  Bathing        Body parts bathed by helper: Buttocks, Front perineal area     Bathing assist Assist Level: Moderate Assistance - Patient 50 - 74%     Upper Body Dressing/Undressing Upper body dressing        Upper body assist      Lower Body Dressing/Undressing Lower body dressing      What is the patient wearing?: Hospital gown only     Lower body assist       Toileting Toileting    Toileting assist       Transfers Chair/bed transfer  Transfers assist           Locomotion Ambulation   Ambulation assist              Walk 10 feet activity   Assist           Walk 50 feet activity   Assist           Walk 150 feet activity   Assist           Walk 10 feet on uneven surface  activity   Assist           Wheelchair     Assist               Wheelchair 50 feet with 2 turns activity    Assist            Wheelchair 150 feet activity     Assist          Blood pressure 92/67, pulse  68, temperature 97.8 F (36.6 C), temperature source Oral, resp. rate 18, SpO2 96 %.  Medical Problem List and Plan: 1.Functional and cognitive deficitssecondary to CNS toxoplasmosis -patient maymayshower -ELOS/Goals: 12-16 days supervision to min assist  -Patient is beginning CIR therapies today including PT, OT, and SLP  2. Antithrombotics: -DVT/anticoagulation:Pharmaceutical:Lovenox -antiplatelet therapy: N/A 3. Pain Management:Tramadol or Tylenol prn for abdominal pain. 4. Mood:LCSW to follow for evaluation and support -antipsychotic agents: N/A 5. Neuropsych: This patientis not fullycapable of making decisions on hisown behalf. 6. Skin/Wound Care:routine pressure relief measures 7. Fluids/Electrolytes/Nutrition:  -osmolite plus supps thru PEG             -NPO  -I personally reviewed the patient's  labs today.    -protein supp for low albumin 8. 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  9. CNS toxoplasmosis: On Pyrimethamine-Leucovorin and Sulfadiazine thorough 12/30. 10. Malnutrition/Severe dysphagia: see above. 11. Hypomagnesemia: IV supplement as needed. added supplement thru peg. Recheck levels this week.     LOS: 1 days A FACE TO FACE EVALUATION WAS PERFORMED  Ranelle Oyster 09/08/2019, 10:15 AM

## 2019-09-08 NOTE — Evaluation (Signed)
Speech Language Pathology Assessment and Plan  Patient Details  Name: Arthur [name] MRN: [id number] Date of Birth: June 03, 1981  SLP Diagnosis: Dysarthria;Cognitive Impairments;Dysphagia  Rehab Potential: Good ELOS: 16-18 days    Today's Date: 09/08/2019 SLP Individual Time: 0804-0900 SLP Individual Time Calculation (min): 56 min   Problem List:  Patient Active Problem List   Diagnosis Date Noted  . Debility 09/07/2019  . Aspiration into airway   . Palliative care by specialist   . DNR (do not resuscitate) discussion   . Weakness generalized   . Dysphagia   . Protein-calorie malnutrition, severe 08/19/2019  . 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 Goals of care, counseling/discussion   . Bacterial meningitis   . Thrush of mouth and esophagus ([place])   . Palliative care encounter   . HIV (human immunodeficiency virus infection) ([place]) 07/29/2019  . Vision loss 07/29/2019  . Abnormal brain MRI 07/29/2019  . Acquired immunodeficiency syndrome ([place]) 07/28/2019  . Bradycardia 07/28/2019  . Hyponatremia 07/28/2019  . Nausea and vomiting 03/25/2013  . Muscle spasm 03/25/2013  . Drug use 01/11/2012  . 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 COUGH 06/23/2009  . CHEST PAIN, PLEURITIC 06/23/2009  . FUNGAL DERMATITIS 05/28/2008  . GERD 05/28/2008  . DENTAL CARIES 02/13/2008  . NICOTINE ADDICTION 11/14/2007  . BACK STRAIN, LUMBAR 08/22/2007  . 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 WEIGHT LOSS, RECENT 05/01/2007  . ANXIETY DEPRESSION 02/14/2007  . HIV DISEASE 02/13/2007  . CERVICAL LYMPHADENOPATHY, ANTERIOR, RIGHT 02/13/2007   Past Medical History:  Past Medical History:  Diagnosis Date  . Anemia   . Anxiety   . Depression   . GERD (gastroesophageal reflux disease)   . HIV infection ([place])   . Substance abuse [facility])    Past Surgical History:  Past Surgical History:  Procedure Laterality Date  . IR GASTROSTOMY TUBE MOD SED  09/02/2019  . TOOTH EXTRACTION      Assessment / Plan /  Recommendation Clinical Impression   Arthur [name] is a 38 year old male with history of HIV, substance abuse, depression, GERD who presented to ED on 07/28/19 with dizziness, weakness and intermittent headaches. MRI brain done revealingrevealing widespread areas of diffusion abnormal contrast in both cerebral hemispheres predominantly left anterior frontal matter concerning for opportunistic infection as well as premature generalized atrophy.He also reported right visual loss and work-up revealed CNS toxoplasmosis complicated with neurosyphilis,optic retinitis/neuritis, advanced HIV and severe dysphagia.Follow up MRI brain showing slow response to treatment. Esophageal candidiasis treated with IV fluconazole and ART added 11/30 without signs of IRIS. Palliative care consulted to discuss goals of care and mother elected on aggressive treatment. He has been maintained on tube feeds with NPO due to severe dysphagia with difficulty handing oral secretions. PEG recommended for nutritional support and was placed on 12/07by Dr. Earleen Newport.  CIR recommended due to functional deficits. SLP evaluation completed on 09/08/2019 with the following results:  At baseline, pt presents with decreased management of his secretions as evidenced by wet vocal quality and respirations as well as congested cough.  Mod-max cues were needed for pt to attempt to clear audible wetness.  Cough does not yet appear to be strong enough to orally expectorate secretions.  During trials of ice chips, pt had immediate coughing in 100% of trials.  No overt s/s of aspiration were evident with teaspoons of honey thick liquids.  For now, I would recommend that pt remain NPO with alternative means of nutrition/PEG and ongoing trials of POs to assess readiness for  repeat instrumental assessment.  Pt also presents with a severe dysarthria characterized by imprecise articulation of consonants resulting from generalized oral motor weakness.  Pt  also has significantly weakened vocal intensity which leads to decreased intelligibility at the word level despite max assist multimodal cues for use of compensatory strategies.   Cognitively, pt presents with mild-moderate deficits in the areas of orientation, memory, problem solving, and reasoning per results from [facility] standardized assessment.  Informally, pt also presents with slowed processing and increased response latency.   Given the abovementioned deficits, pt would benefit from skilled ST while inpatient in order to maximize functional independence and reduce burden of care prior to discharge.  Anticipate that pt will need 24/7 supervision at discharge in addition to [place] follow up at next level of care.     Skilled Therapeutic Interventions          Cognitive-linguistic evaluation completed with results and recommendations reviewed with family.     SLP Assessment  Patient will need skilled Speech Lanaguage Pathology Services during CIR admission    Recommendations  SLP Diet Recommendations: NPO;Alternative means - temporary Medication Administration: Via alternative means Oral Care Recommendations: Oral care QID Recommendations for Other Services: Neuropsych consult Patient destination: Home Follow up Recommendations: Home Health SLP;24 hour supervision/assistance Equipment Recommended: To be determined    SLP Frequency 3 to 5 out of 7 days   SLP Duration  SLP Intensity  SLP Treatment/Interventions 16-18 days  Minumum of 1-2 x/day, 30 to 90 minutes  Cognitive remediation/compensation;Dysphagia/aspiration precaution training;Internal/external aids;Cueing hierarchy;Environmental controls;Functional tasks;Patient/family education;Speech/Language facilitation    Pain Pain Assessment Pain Scale: 0-10 Pain Score: 0-No pain  Prior Functioning Type of Home: Apartment  Lives With: Other (Comment)(sister) Available Help at Discharge: Family;Friend(s);Available  PRN/intermittently Vocation: Full time employment([employer])  SLP Evaluation Cognition Overall Cognitive Status: Impaired/Different from baseline Arousal/Alertness: Awake/alert Orientation Level: Oriented to person;Oriented to place Attention: Alternating Alternating Attention: Appears intact Memory: Impaired Memory Impairment: Decreased recall of new information;Retrieval deficit Awareness: Impaired Awareness Impairment: Emergent impairment;Anticipatory impairment Problem Solving: Impaired Problem Solving Impairment: Functional complex Executive Function: Initiating Initiating: Impaired Initiating Impairment: Functional basic;Verbal basic Safety/Judgment: Impaired  Comprehension Auditory Comprehension Overall Auditory Comprehension: Appears within functional limits for tasks assessed Expression Expression Primary Mode of Expression: Verbal Verbal Expression Overall Verbal Expression: Appears within functional limits for tasks assessed Oral Motor Oral Motor/Sensory Function Overall Oral Motor/Sensory Function: Generalized oral weakness Motor Speech Overall Motor Speech: Impaired Respiration: Impaired Level of Impairment: Word Phonation: Low vocal intensity;Wet Resonance: Hyponasality Articulation: Impaired Level of Impairment: Word Intelligibility: Intelligibility reduced Word: 25-49% accurate Phrase: 0-24% accurate Motor Planning: Witnin functional limits     Bedside Swallowing Assessment General Previous Swallow Assessment: MBS 11/27 Diet Prior to this Study: NPO;PEG tube Temperature Spikes Noted: No Respiratory Status: Room air History of Recent Intubation: No Behavior/Cognition: Alert;Cooperative Oral Cavity - Dentition: Missing dentition;Poor condition Self-Feeding Abilities: Able to feed self;Needs set up Patient Positioning: Upright in bed Baseline Vocal Quality: Low vocal intensity;Wet Volitional Cough: Wet  Oral Care Assessment   Ice Chips Ice  chips: Impaired Presentation: Spoon Oral Phase Impairments: Poor awareness of bolus;Impaired mastication Oral Phase Functional Implications: Prolonged oral transit;Oral holding;Right anterior spillage;Left anterior spillage Pharyngeal Phase Impairments: Cough - Immediate Thin Liquid   Nectar Thick   Honey Thick Honey Thick Liquid: Within functional limits Presentation: Spoon Puree   Solid   BSE Assessment Risk for Aspiration Impact on safety and function: Severe aspiration risk;Risk for inadequate nutrition/hydration Other Related Risk Factors: Cognitive  impairment;Deconditioning;Decreased management of secretions  Short Term Goals: Week 1: SLP Short Term Goal 1 (Week 1): Pt will consume therapeutic trials of ice chips with minimal overt s/s of aspiration and min cues for use of swallowing precautions over 3 consecutive sessions prior to repeat instrumental assessment. SLP Short Term Goal 2 (Week 1): Pt will use a volitional cough or throat clear to clear his secretions and vocal wetness with mod verbal cues in 75% of opportunities. SLP Short Term Goal 3 (Week 1): Pt will increase vocal intensity and overarticulate to achieve speech intelligibility at the word level with mod assist verbal cues. SLP Short Term Goal 4 (Week 1): Pt will use external aids for recall of daily information with mod assist verbal and visual cues. SLP Short Term Goal 5 (Week 1): Pt will complete mildly complex tasks with min assist verbal cues for functional problem solving.  Refer to Care Plan for Long Term Goals  Recommendations for other services: Neuropsych  Discharge Criteria: Patient will be discharged from SLP if patient refuses treatment 3 consecutive times without medical reason, if treatment goals not met, if there is a change in medical status, if patient makes no progress towards goals or if patient is discharged from hospital.  The above assessment, treatment plan, treatment alternatives and  goals were discussed and mutually agreed upon: by patient  Emilio Math 09/08/2019, 10:56 AM

## 2019-09-08 NOTE — Evaluation (Signed)
Physical Therapy Assessment and Plan  Patient Details  Name: Arthur [name] MRN: [id number] Date of Birth: 1980-12-11  PT Diagnosis: Abnormal posture, Ataxia, Difficulty walking, Impaired sensation and Muscle weakness Rehab Potential: Good ELOS: 2.5- 3 weeks   Today's Date: 09/08/2019 PT Individual Time: 1402-1500 PT Individual Time Calculation (min): 58 min     Problem List:  Patient Active Problem List   Diagnosis Date Noted   Debility 09/07/2019   Aspiration into airway    Palliative care by specialist    DNR (do not resuscitate) discussion    Weakness generalized    Dysphagia    Protein-calorie malnutrition, severe 08/19/2019   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   Goals of care, counseling/discussion    Bacterial meningitis    Thrush of mouth and esophagus ([place])    Palliative care encounter    HIV (human immunodeficiency virus infection) ([place]) 07/29/2019   Vision loss 07/29/2019   Abnormal brain MRI 07/29/2019   Acquired immunodeficiency syndrome ([place]) 07/28/2019   Bradycardia 07/28/2019   Hyponatremia 07/28/2019   Nausea and vomiting 03/25/2013   Muscle spasm 03/25/2013   Drug use 01/11/2012   Neurosyphilis 11/23/2011   COUGH 06/23/2009   CHEST PAIN, PLEURITIC 06/23/2009   FUNGAL DERMATITIS 05/28/2008   GERD 05/28/2008   DENTAL CARIES 02/13/2008   NICOTINE ADDICTION 11/14/2007   BACK STRAIN, LUMBAR 08/22/2007   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 year old male with history of HIV, substance abuse, depression, GERD who presented to ED on 07/28/19 with dizziness, weakness and intermittent headaches. Patient had been noncompliant with his HIV drugs due to lack of insurance/affordibility issues. MRI brain done revealing revealing widespread areas of diffusion abnormal contrast in both cerebral hemispheres predominantly left anterior frontal matter concerning for opportunistic infection as well as premature generalized atrophy. 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  ID consulted for input and patient started on  pyrimethamine-Leucovorin as well as sulfadiazine for 6 weeks treatment--end date 09/26/19.  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  Dr. 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  Esophageal candidiasis treated with IV fluconazole and ART added 11/30 without signs of IRIS.  Palliative care consulted to discuss goals of care and mother elected on aggressive treatment.  He has been maintained on tube feeds with NPO due to severe dysphagia with difficulty handing oral secretions.  PEG recommended for nutritional support and as mother was agreeable,  this was placed on 12/07 by Dr. Earleen Newport. 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 Encephalopathy with confusion resolving and he is  showing improvement in activity tolerance. CIR recommended due to functional deficits.   Patient currently requires mod with mobility secondary to muscle weakness, impaired timing and sequencing, ataxia and decreased coordination,  decreased attention and decreased awareness and decreased standing balance, decreased postural control and decreased balance strategies.  Prior to hospitalization, patient was independent  with mobility and lived with Family(sister and family friend to provide assist at d/c) in a [place] home.  Home access is 6Stairs to enter.  Patient will benefit from skilled PT intervention to maximize safe functional mobility, minimize fall risk and decrease caregiver burden for planned discharge home with 24 hour supervision.  Anticipate patient will benefit from follow up [place] at discharge.  PT - End of Session Activity Tolerance: Tolerates 30+ min activity with multiple rests Endurance Deficit: Yes PT Assessment Rehab Potential (ACUTE/IP ONLY): Good PT Barriers to Discharge: Inaccessible home environment PT Barriers to Discharge Comments: 6 STE PT Patient demonstrates impairments in the following area(s): Balance;Safety;Behavior;Edema;Endurance;Motor;Perception;Pain;Sensory PT Transfers Functional Problem(s): Bed Mobility;Bed to Chair;Car;Furniture;Floor PT Locomotion Functional Problem(s): Stairs;Wheelchair Mobility;Ambulation PT Plan PT Intensity: Minimum of 1-2 x/day ,45 to 90 minutes PT Frequency: 5 out of 7 days PT Duration Estimated Length of Stay: 2.5- 3 weeks PT Treatment/Interventions: Ambulation/gait training;Community reintegration;DME/adaptive equipment instruction;Neuromuscular re-education;Stair training;UE/LE Strength taining/ROM;Psychosocial support;Therapeutic Activities;Functional electrical stimulation;Discharge planning;Balance/vestibular training;UE/LE Coordination activities;Therapeutic Exercise;Patient/family education;Functional mobility training;Cognitive remediation/compensation;Splinting/orthotics;Visual/perceptual remediation/compensation;Wheelchair propulsion/positioning;Skin care/wound management;Pain management;Disease management/prevention PT Transfers Anticipated Outcome(s):  supervision PT Locomotion Anticipated Outcome(s): supervision PT Recommendation Recommendations for Other Services: Neuropsych consult Follow Up Recommendations: Home health PT Patient destination: Home Equipment Recommended: To be determined  Skilled Therapeutic Intervention Evaluation completed (see details above and below) with education on PT POC and goals and individual treatment initiated with focus on functional mobility, safety, transfers, education, and gait. Pt supine in bed upon PT arrival, agreeable to therapy tx and denies pain. Pt reports having to use the bathroom. Pt transferred to sitting EOB with min assist. Pt ambulated from bed<>toilet this session 2 x 10 ft with RW and mod assist, pt performed clothing management and pericare with min-mod assist for standing balance. Pt standing at sink with min assist for balance while washing hands. Pt transferred bed>w/c with mod assist and transported to the gym . Pt ascended/descended 4 steps this session with mod assist, B rails and step to pattern, pt reports increased fatigue following activity. Pt performed stand pivot car transfer this session with RW and mod assist. Pt ambulated x 52 ft with RW and mod assist this session, pt with narrow BOS and scissoring at times, cues for foot placement, distance limited by fatigue. Pt transported back to room and transferred to bed with mod assist. Sit>supine min assist, left with needs in reach, chair alarm set and family member present.   PT Evaluation Precautions/Restrictions Precautions Precautions: Fall Precaution Comments: PEG, foley, visual field loss Rt eye Restrictions Weight Bearing Restrictions: No General   Vital SignsTherapy Vitals Temp: 98 F (36.7 C) Pulse Rate: (!) 56 Resp: 18 BP: (!) 83/53 Patient Position (if appropriate): Lying Oxygen Therapy SpO2: 100 % O2 Device: Room Air Pain   Denies pain  Home Living/Prior Functioning Home Living Available Help at Discharge:  Family;Friend(s);Available 24 hours/day Type of Home: Apartment Home Access: Stairs to enter Entrance Stairs-Number of Steps: 6 Entrance Stairs-Rails: Left;Right;Can reach both Home Layout: Two level(pt able to live on the main floor, planning to get bed on this floor) Alternate Level Stairs-Number of Steps: 12 Bathroom Shower/Tub: Chiropodist: Standard Bathroom  Accessibility: Yes  Lives With: Family(sister and family friend to provide assist at d/c) Prior Function Level of Independence: Independent with basic ADLs;Independent with homemaking with ambulation Driving: No Vocation: Full time employment([employer])  Cognition Overall Cognitive Status: Impaired/Different from baseline Arousal/Alertness: Awake/alert Orientation Level: Oriented to person;Oriented to place Attention: Alternating Alternating Attention: Appears intact Memory: Impaired Memory Impairment: Decreased recall of new information;Retrieval deficit Awareness: Impaired Problem Solving: Impaired Safety/Judgment: Impaired Comments: mild impulsivity Sensation Sensation Light Touch: Appears Intact Proprioception: Impaired by gross assessment Coordination Gross Motor Movements are Fluid and Coordinated: No Fine Motor Movements are Fluid and Coordinated: No Coordination and Movement Description: coordination impaired by ataxia and weakness Motor  Motor Motor: Hemiplegia;Ataxia;Abnormal postural alignment and control  Mobility Bed Mobility Bed Mobility: Rolling Right;Rolling Left;Supine to Sit;Sit to Supine Rolling Right: Contact Guard/Touching assist Rolling Left: Contact Guard/Touching assist Supine to Sit: Minimal Assistance - Patient > 75% Sit to Supine: Minimal Assistance - Patient > 75% Transfers Transfers: Sit to [employer] Transfers Sit to Stand: Moderate Assistance - Patient 50-74%;Minimal Assistance - Patient > 75% Stand Pivot Transfers: Moderate Assistance - Patient 50 -  74% Stand Pivot Transfer Details: Verbal cues for technique;Verbal cues for precautions/safety;Manual facilitation for weight shifting;Verbal cues for safe use of DME/AE Locomotion  Gait Ambulation: Yes Gait Assistance: Moderate Assistance - Patient 50-74% Gait Distance (Feet): 52 Feet Assistive device: Rolling walker Gait Assistance Details: Tactile cues for initiation;Tactile cues for posture;Visual cues for safe use of DME/AE;Verbal cues for safe use of DME/AE;Manual facilitation for weight shifting Gait Gait: Yes Gait Pattern: Impaired Gait Pattern: Ataxic;Scissoring;Narrow base of support Gait velocity: decreased Stairs / Additional Locomotion Stairs: Yes Stairs Assistance: Moderate Assistance - Patient 50 - 74% Stair Management Technique: Two rails Number of Stairs: 4 Height of Stairs: 6  Trunk/Postural Assessment  Cervical Assessment Cervical Assessment: Exceptions to WFL(forward head posture) Thoracic Assessment Thoracic Assessment: Exceptions to WFL(rounded shoulders) Lumbar Assessment Lumbar Assessment: Exceptions to WFL(posterior pelvic tilt) Postural Control Postural Control: Deficits on evaluation  Balance Balance Balance Assessed: Yes Static Sitting Balance Static Sitting - Level of Assistance: 5: Stand by assistance Dynamic Sitting Balance Dynamic Sitting - Level of Assistance: 5: Stand by assistance Static Standing Balance Static Standing - Level of Assistance: 4: Min assist;3: Mod assist Dynamic Standing Balance Dynamic Standing - Level of Assistance: 3: Mod assist Extremity Assessment  RLE Assessment General Strength Comments: grossly 3- to 4/5 throughout LLE Assessment General Strength Comments: grossly 3- to 4/5 throughout    Refer to Care Plan for Long Term Goals  Recommendations for other services: None   Discharge Criteria: Patient will be discharged from PT if patient refuses treatment 3 consecutive times without medical reason, if treatment  goals not met, if there is a change in medical status, if patient makes no progress towards goals or if patient is discharged from hospital.  The above assessment, treatment plan, treatment alternatives and goals were discussed and mutually agreed upon: by patient  Netta Corrigan, PT, DPT, CSRS 09/08/2019, 5:15 PM

## 2019-09-09 ENCOUNTER — Inpatient Hospital Stay (HOSPITAL_COMMUNITY): Payer: Self-pay | Admitting: Physical Therapy

## 2019-09-09 ENCOUNTER — Inpatient Hospital Stay (HOSPITAL_COMMUNITY): Payer: Self-pay | Admitting: Occupational Therapy

## 2019-09-09 ENCOUNTER — Inpatient Hospital Stay (HOSPITAL_COMMUNITY): Payer: Self-pay

## 2019-09-09 DIAGNOSIS — D72818 Other decreased white blood cell count: Secondary | ICD-10-CM

## 2019-09-09 DIAGNOSIS — D62 Acute posthemorrhagic anemia: Secondary | ICD-10-CM

## 2019-09-09 DIAGNOSIS — E871 Hypo-osmolality and hyponatremia: Secondary | ICD-10-CM

## 2019-09-09 DIAGNOSIS — B20 Human immunodeficiency virus [HIV] disease: Secondary | ICD-10-CM | POA: Insufficient documentation

## 2019-09-09 DIAGNOSIS — R0989 Other specified symptoms and signs involving the circulatory and respiratory systems: Secondary | ICD-10-CM | POA: Insufficient documentation

## 2019-09-09 DIAGNOSIS — D72819 Decreased white blood cell count, unspecified: Secondary | ICD-10-CM

## 2019-09-09 LAB — GLUCOSE, CAPILLARY
Glucose-Capillary: 82 mg/dL (ref 70–99)
Glucose-Capillary: 96 mg/dL (ref 70–99)
Glucose-Capillary: 78 mg/dL (ref 70–99)
Glucose-Capillary: 76 mg/dL (ref 70–99)
Glucose-Capillary: 92 mg/dL (ref 70–99)

## 2019-09-09 LAB — CBC
HCT: 30.8 % — ABNORMAL LOW (ref 39.0–52.0)
Hemoglobin: 10.5 g/dL — ABNORMAL LOW (ref 13.0–17.0)
MCH: 32 pg (ref 26.0–34.0)
MCHC: 34.1 g/dL (ref 30.0–36.0)
MCV: 93.9 fL (ref 80.0–100.0)
Platelets: 210 10*3/uL (ref 150–400)
RBC: 3.28 MIL/uL — ABNORMAL LOW (ref 4.22–5.81)
RDW: 17.2 % — ABNORMAL HIGH (ref 11.5–15.5)
WBC: 1.9 10*3/uL — ABNORMAL LOW (ref 4.0–10.5)
nRBC: 1 % — ABNORMAL HIGH (ref 0.0–0.2)

## 2019-09-09 LAB — BASIC METABOLIC PANEL
Anion gap: 7 (ref 5–15)
BUN: 13 mg/dL (ref 6–20)
CO2: 25 mmol/L (ref 22–32)
Calcium: 9 mg/dL (ref 8.9–10.3)
Chloride: 101 mmol/L (ref 98–111)
Creatinine, Ser: 0.78 mg/dL (ref 0.61–1.24)
GFR calc Af Amer: >60 mL/min (ref >60)
GFR calc non Af Amer: >60 mL/min (ref >60)
Glucose, Bld: 91 mg/dL (ref 70–99)
Potassium: 4.2 mmol/L (ref 3.5–5.1)
Sodium: 133 mmol/L — ABNORMAL LOW (ref 135–145)

## 2019-09-09 MED ORDER — PRO-STAT SUGAR FREE PO LIQD
30.0000 mL | Freq: Two times a day (BID) | ORAL | Status: DC
  Administered 2019-09-09 – 2019-09-12 (×6): 30 mL
  Filled 2019-09-09 (×5): qty 30

## 2019-09-09 MED ORDER — OSMOLITE 1.5 CAL PO LIQD
355.0000 mL | Freq: Four times a day (QID) | ORAL | Status: DC
  Administered 2019-09-09: 237 mL
  Administered 2019-09-09: 100 mL
  Administered 2019-09-09: 355 mL
  Administered 2019-09-10: 200 mL
  Filled 2019-09-09: qty 474

## 2019-09-09 NOTE — IPOC Note (Signed)
Individualized overall Plan of Care [facility]) Patient Details Name: Arthur Arthur [name] MRN: [id number] DOB: 12-15-1980  Admitting Diagnosis: CNS toxoplasmosis [facility])  Hospital Problems: Principal Problem:   CNS toxoplasmosis (HCC) Active Problems:   Debility   Acute blood loss anemia   Leucopenia   Labile blood pressure   HIV disease (HCC)     Functional Problem List: Nursing Bladder, Bowel, Nutrition, Skin Integrity  PT Balance, Safety, Behavior, Edema, Endurance, Motor, Perception, Pain, Sensory  OT Balance, Nutrition, Safety, Cognition, Skin Integrity, Endurance, Vision, Motor  SLP Cognition, Linguistic, Nutrition  TR         Basic ADL's: OT Eating, Grooming, Bathing, Dressing, Toileting     Advanced  ADL's: OT Simple Meal Preparation     Transfers: PT Bed Mobility, Bed to Chair, Car, Furniture, Floor  OT Toilet, Tub/Shower     Locomotion: PT Stairs, Psychologist, prison and probation services, Ambulation     Additional Impairments: OT Fuctional Use of Upper Extremity  SLP Swallowing, Communication, Social Cognition expression Memory, Problem Solving, Awareness  TR      Anticipated Outcomes Item Anticipated Outcome  Self Feeding No goal  Swallowing  Min assist   Basic self-care  Supervision  Toileting  Min A   Bathroom Transfers Supervision-Min A  Bowel/Bladder  min assist  Transfers  supervision  Locomotion  supervision  Communication  Min assist  Cognition  Supervision  Pain  less than 3 out of 10  Safety/Judgment  mod I   Therapy Plan: PT Intensity: Minimum of 1-2 x/day ,45 to 90 minutes PT Frequency: 5 out of 7 days PT Duration Estimated Length of Stay: 2.5- 3 weeks OT Intensity: Minimum of 1-2 x/day, 45 to 90 minutes OT Frequency: 5 out of 7 days OT Duration/Estimated Length of Stay: 14-16 days SLP Intensity: Minumum of 1-2 x/day, 30 to 90 minutes SLP Frequency: 3 to 5 out of 7 days SLP Duration/Estimated Length of Stay: 16-18 days    Team  Interventions: Nursing Interventions Patient/Family Education, Bladder Management, Bowel Management, Pain Management, Medication Management, Skin Care/Wound Management  PT interventions Ambulation/gait training, Community reintegration, DME/adaptive equipment instruction, Neuromuscular re-education, Stair training, UE/LE Strength taining/ROM, Psychosocial support, Therapeutic Activities, Functional electrical stimulation, Discharge planning, Warden/ranger, UE/LE Coordination activities, Therapeutic Exercise, Patient/family education, Functional mobility training, Cognitive remediation/compensation, Splinting/orthotics, Visual/perceptual remediation/compensation, Wheelchair propulsion/positioning, Skin care/wound management, Pain management, Disease management/prevention  OT Interventions Balance/vestibular training, Discharge planning, Pain management, Self Care/advanced ADL retraining, Therapeutic Activities, UE/LE Coordination activities, Visual/perceptual remediation/compensation, Therapeutic Exercise, Skin care/wound managment, Patient/family education, Functional mobility training, Disease mangement/prevention, Cognitive remediation/compensation, Community reintegration, Neuromuscular re-education, DME/adaptive equipment instruction, Psychosocial support, UE/LE Strength taining/ROM, Wheelchair propulsion/positioning  SLP Interventions Cognitive remediation/compensation, Dysphagia/aspiration precaution training, Internal/external aids, Financial trader, Environmental controls, Functional tasks, Patient/family education, Speech/Language facilitation  TR Interventions    SW/CM Interventions Discharge Planning, Psychosocial Support, Patient/Family Education   Barriers to Discharge MD  Medical stability, Wound care, Medication compliance and Nutritional means  Nursing Wound Care, Incontinence    PT Inaccessible home environment 6 STE  OT Home environment access/layout, Weight, Medical  stability, Other (comments) PEG  SLP      SW Medication compliance, Other (comments) Non-compliant with medications and uninsured-was not seeing MD prior to admission   Team Discharge Planning: Destination: PT-Home ,OT- Home , SLP-Home Projected Follow-up: PT-Home health PT, OT-  Home health OT, SLP-Home Health SLP, 24 hour supervision/assistance Projected Equipment Needs: PT-To be determined, OT- To be determined, SLP-To be determined Equipment Details: PT- , OT-  Patient/family involved in  discharge planning: PT- Patient, Family member/caregiver,  OT-Patient, SLP-Patient  MD ELOS: 15-19 days. Medical Rehab Prognosis:  Good Assessment: 38 year Arthur Arthur [name] with history of HIV, substance abuse, depression, GERD who presented to ED on 07/28/19 with dizziness, weakness and intermittent headaches. Patient had been noncompliant with his HIV drugs due to lack of insurance/affordibility issues. 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 Dr. Eulas Post consulted for input and felt that patient with optic neuropathy with retinitis OD.  Follow up MRI brain showing slow response to treatment. Esophageal candidiasis treated with IV fluconazole and ART added 11/30 without signs of IRIS. Palliative care consulted to discuss goals of care and mother elected on aggressive treatment. He has been maintained on tube feeds with NPO due to severe dysphagia with difficulty handing oral  secretions. PEG recommended for nutritional support and as motherwasagreeable,this was placed on 12/07by Dr. Earleen Newport. 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 Encephalopathy with confusion resolving and he is showing improvement in activity tolerance.  Patient with resulting functional deficit with mobility, cognition, swallowing, self-care.  We will set goals for supervision/min a with PT/OT/SLP.  Due to the current state of emergency, patients may not be receiving their 3-hours of Medicare-mandated therapy.  See Team Conference Notes for weekly updates to the plan of care

## 2019-09-09 NOTE — Progress Notes (Signed)
Cristina Gong, RN  Rehab Admission Coordinator  Physical Medicine and Rehabilitation  PMR Pre-admission  Signed  Date of Service:  09/03/2019  4:59 PM      Related encounter: ED to Hosp-Admission (Discharged) from 07/28/2019 in [facility]        PMR Admission [place] Assessment   Patient: Arthur [name] is an 38 y.o., male MRN: [id number] DOB: Aug 03, 1981 Height: (Could not obtain weight, pt on new bed that was not zeroed) Weight: 48.3 kg                                                                                                                                                  Insurance Information HMO:     PPO:      PCP:      IPA:      80/20:     OTHER:  PRIMARY: uninsured (self pay)      Policy#:       Subscriber:  CM Name:       Phone#:      Fax#:  Pre-Cert#:       Employer:  Benefits:  Phone #:      Name:  Eff. Date:      Deduct:       Out of Pocket Max:       Life Max:  CIR: pt/mother is aware of estimated daily cost of care ($3,500).       SNF:  Outpatient:      Co-Pay:  Home Health:       Co-Pay:  DME:      Co-Pay:  Providers:    Pt/mother has been in contact with financial counseling and MAD application has been initiated. They are aware that if he is not approved for Medicaid he will be responsible for the bill for CIR.   SECONDARY:  None     Policy#:       Subscriber:  CM Name:       Phone#:      Fax#:  Pre-Cert#:       Employer:  Benefits:  Phone #:      Name:  Eff. Date:      Deduct      Out of Pocket Max:       Life Max:  CIR:       SNF:  Outpatient:      Co-Pay:  Home Health:       Co-Pay:  DME:      Co-Pay:    Medicaid application on file. County: [place] Caseworker: Seabron Spates Ph #: [phone] Program: MAD Date of Application: 70-35-00 Due Date: 11-24-19 Retro/Ongoing   The "Data Collection Information Summary" for patients in Inpatient Rehabilitation Facilities with attached "Privacy Act  [place] Records" was provided and verbally  reviewed with: N/A   Emergency Contact Information         Contact Information     Name Relation Home Work Mobile    Moore,[name] Mother [phone]   [phone]       Current Medical History  Patient Admitting Diagnosis: Debility due to CNS toxoplasmosis in setting of advanced HIV   History of Present Illness: Pt is a 38 yo Male with history of HIV, substance abuse, depression, and GERD. He presented to the hospital on 07/28/2019 with dizziness, weakness, and intermittent headache. He also reported noncompliance to his HIV medications for approx 1 year due to financial issues. An MRI was completed which suggested an opportunistic infection, such as toxoplasmosis, tuberculosis or fungal cerebritis/meningitis, or parasitic infection. Pt was stated on penicillin and ganciclovir for possible CMV retinitis verses synovitis. ID has been following regarding his CNS toxoplasmosis. Related to his CNS infection has been acute toxic/metabolic encephalopathy, which has gradually improved. Pt's course has been complicated by anemia of chronic disease, leukopenia, which has most likely been linked to his AIDS, severe hyponatremia which has resolved since being treated with 3% saline and IV lasix, AKI, and dysphasia. Pt has had severe protein calorie malnutrition/AIDS wasting syndrome and required PEG placement by IR on 09/02/2019 due to dysphagia. Pt has also been having hypogylcemia, bradycardia, and low BP-though he has not been symptomatic of his low BP.    Complete NIHSS TOTAL: 2 Glasgow Coma Scale Score: 15   Past Medical History      Past Medical History:  Diagnosis Date  . Anemia    . Anxiety    . Depression    . GERD (gastroesophageal reflux disease)    . HIV infection (HCC)    . Substance abuse (HCC)        Family History  family history is not on file.   Prior Rehab/Hospitalizations:  Has the patient had prior rehab or  hospitalizations prior to admission? No   Has the patient had major surgery during 100 days prior to admission? Yes   Current Medications    Current Facility-Administered Medications:  .  acetaminophen (TYLENOL) tablet 650 mg, 650 mg, Oral, Q6H PRN, 650 mg at 09/02/19 1453 **OR** acetaminophen (TYLENOL) suppository 650 mg, 650 mg, Rectal, Q6H PRN, Doutova, Anastassia, MD .  chlorproMAZINE (THORAZINE) 12.5 mg in sodium chloride 0.9 % 25 mL IVPB, 12.5 mg, Intravenous, Q8H PRN, Dellinger, Marianne L, PA-C, Last Rate: 50 mL/hr at 09/01/19 1500, Rate Verify at 09/01/19 1500 .  dolutegravir (TIVICAY) tablet 50 mg, 50 mg, Oral, Daily, Judyann MunsonSnider, Cynthia, MD, 50 mg at 09/06/19 0842 .  emtricitabine-tenofovir AF (DESCOVY) 200-25 MG per tablet 1 tablet, 1 tablet, Oral, Daily, Judyann MunsonSnider, Cynthia, MD, 1 tablet at 09/06/19 0843 .  enoxaparin (LOVENOX) injection 40 mg, 40 mg, Subcutaneous, Q24H, Allred, Darrell K, PA-C, 40 mg at 09/06/19 1046 .  famotidine (PEPCID) 40 MG/5ML suspension 20 mg, 20 mg, Per Tube, Daily, Noralee Stainhoi, Jennifer, DO, 20 mg at 09/06/19 0845 .  feeding supplement (OSMOLITE 1.5 CAL) liquid 1,000 mL, 1,000 mL, Per Tube, Continuous, Kyle, Tyrone A, DO, Last Rate: 65 mL/hr at 09/06/19 0638, 1,000 mL at 09/06/19 0638 .  feeding supplement (PRO-STAT SUGAR FREE 64) liquid 30 mL, 30 mL, Per Tube, Daily, Kyle, Tyrone A, DO, 30 mL at 09/06/19 0844 .  free water 100 mL, 100 mL, Per Tube, Q8H, Noralee Stainhoi, Jennifer, DO, 100 mL at 09/06/19 0642 .  guaiFENesin-dextromethorphan (ROBITUSSIN DM) 100-10 MG/5ML syrup 5 mL, 5 mL, Oral,  Q4H PRN, Audrea Muscat T, NP, 5 mL at 08/18/19 1736 .  haloperidol lactate (HALDOL) injection 1 mg, 1 mg, Intravenous, Q6H PRN, Shahmehdi, Seyed A, MD, 1 mg at 08/27/19 1954 .  insulin aspart (novoLOG) injection 0-9 Units, 0-9 Units, Subcutaneous, Q4H, Noralee Stain, DO, 1 Units at 09/02/19 0059 .  ipratropium-albuterol (DUONEB) 0.5-2.5 (3) MG/3ML nebulizer solution 3 mL, 3 mL, Nebulization,  Q4H PRN, Alekh, Kshitiz, MD .  loperamide HCl (IMODIUM) 1 MG/7.5ML suspension 2 mg, 2 mg, Per Tube, PRN, Hanley Ben, Kshitiz, MD, 2 mg at 09/01/19 1619 .  MEDLINE mouth rinse, 15 mL, Mouth Rinse, BID, Shahmehdi, Seyed A, MD, 15 mL at 09/06/19 1047 .  midodrine (PROAMATINE) tablet 2.5 mg, 2.5 mg, Oral, TID WC, Kyle, Tyrone A, DO, 2.5 mg at 09/06/19 0840 .  multivitamin liquid 15 mL, 15 mL, Per Tube, Daily, Alekh, Kshitiz, MD, 15 mL at 09/06/19 0844 .  ondansetron (ZOFRAN) tablet 4 mg, 4 mg, Oral, Q6H PRN **OR** ondansetron (ZOFRAN) injection 4 mg, 4 mg, Intravenous, Q6H PRN, Doutova, Anastassia, MD, 4 mg at 09/03/19 1635 .  phenol (CHLORASEPTIC) mouth spray 1 spray, 1 spray, Mouth/Throat, PRN, Ghimire, Kuber, MD .  Pyrimethamine-Leucovorin 50-25 MG CAPS 50 mg, 50 mg, Per Tube, Daily, Della Goo, RPH, 50 mg at 09/05/19 2100 .  sulfaDIAZINE tablet 1,000 mg, 1,000 mg, Per Tube, Q6H, Kyle, Tyrone A, DO, 1,000 mg at 09/06/19 1610 .  sulfamethoxazole-trimethoprim (BACTRIM) 200-40 MG/5ML suspension 20 mL, 20 mL, Per Tube, 3 times weekly, Della Goo, RPH, 20 mL at 09/06/19 0844 .  terbinafine (LAMISIL) 1 % cream, , Topical, Daily, Noralee Stain, DO, Given at 09/06/19 1045 .  traMADol (ULTRAM) tablet 50 mg, 50 mg, Per Tube, Q6H PRN, Hanley Ben, Kshitiz, MD, 50 mg at 09/04/19 1951   Patients Current Diet:     Diet Order                      Diet NPO time specified  Diet effective midnight                   Precautions / Restrictions Precautions Precautions: Fall Precaution Comments: soft BP, Peg tube Restrictions Weight Bearing Restrictions: No    Has the patient had 2 or more falls or a fall with injury in the past year?No   Prior Activity Level Community (5-7x/wk): working at [employer], independent without AD use, did drive PTA   Prior Functional Level Prior Function Level of Independence: Independent   Self Care: Did the patient need help bathing, dressing, using the toilet  or eating?  Independent   Indoor Mobility: Did the patient need assistance with walking from room to room (with or without device)? Independent   Stairs: Did the patient need assistance with internal or external stairs (with or without device)? Independent   Functional Cognition: Did the patient need help planning regular tasks such as shopping or remembering to take medications? Independent   Home Assistive Devices / Equipment Home Assistive Devices/Equipment: None Home Equipment: None   Prior Device Use: Indicate devices/aids used by the patient prior to current illness, exacerbation or injury? None of the above   Current Functional Level Cognition   Overall Cognitive Status: Impaired/Different from baseline Difficult to assess due to: Impaired communication(pt encouraged to verbalize & was able to do so (short phrases) 3x during session) Current Attention Level: Selective Orientation Level: Oriented X4 Following Commands: Follows one step commands consistently, Follows one step commands with  increased time, Follows multi-step commands inconsistently Safety/Judgement: Decreased awareness of safety General Comments: pt able to verbalize when prompted, soft volume    Extremity Assessment (includes Sensation/Coordination)   Upper Extremity Assessment: LUE deficits/detail LUE Deficits / Details: weakness, falls off L handle of walker during ambulation, can bring to mouth to wipe saliva LUE Coordination: decreased fine motor, decreased gross motor  Lower Extremity Assessment: Generalized weakness     ADLs   Overall ADL's : Needs assistance/impaired Eating/Feeding: NPO Grooming: Oral care, Sitting, Moderate assistance Grooming Details (indicate cue type and reason): difficulty spitting, used suction Upper Body Bathing: Moderate assistance, Bed level Upper Body Bathing Details (indicate cue type and reason): pt using R hand to wash chest and L underarm. pt using bil Ue to apply  deordorant to each arm pit. pt total (A) for R arm and under arm. Pt with deep breath after task  Lower Body Bathing: Minimal assistance, Sit to/from stand Upper Body Dressing : Moderate assistance, Sitting Upper Body Dressing Details (indicate cue type and reason): to don front opening gown Lower Body Dressing: Total assistance, Sit to/from stand Lower Body Dressing Details (indicate cue type and reason): socks Toilet Transfer: Minimal assistance, +2 for physical assistance, +2 for safety/equipment, RW, Ambulation Toilet Transfer Details (indicate cue type and reason): simulated to recliner  Toileting- Clothing Manipulation and Hygiene: Minimal assistance Functional mobility during ADLs: Minimal assistance, +2 for physical assistance, +2 for safety/equipment, Rolling walker, Cueing for safety, Cueing for sequencing General ADL Comments: pt limited by impaired cognition, weakness, balance and activity tolerance     Mobility   Overal bed mobility: Needs Assistance Bed Mobility: Supine to Sit Rolling: Mod assist Sidelying to sit: Max assist Supine to sit: Min assist, HOB elevated(bed rails) Sit to supine: Mod assist, HOB elevated General bed mobility comments: max cuing & extra time with some assist to upright self vs R lateral lean     Transfers   Overall transfer level: Needs assistance Equipment used: Rolling walker (2 wheeled) Transfer via Lift Equipment: Stedy Transfers: Sit to/from Stand Sit to Stand: Min assist, +2 safety/equipment Stand pivot transfers: Mod assist, +2 safety/equipment General transfer comment: assist to place L hand on walker, min assist to rise and steady     Ambulation / Gait / Stairs / Wheelchair Mobility   Ambulation/Gait Ambulation/Gait assistance: +2 physical assistance, +2 safety/equipment, Min assist Gait Distance (Feet): 40 Feet Assistive device: Rolling walker (2 wheeled) Gait Pattern/deviations: Decreased step length - right, Decreased step length  - left, Narrow base of support, Trunk flexed, Scissoring, Decreased stride length General Gait Details: intermittent scissoring gait, up to heavy assistance at times for managing RW & turning with AD Gait velocity: decreased Gait velocity interpretation: <1.31 ft/sec, indicative of household ambulator     Posture / Balance Dynamic Sitting Balance Sitting balance - Comments: R lateral lean in sitting, requires assistance to upright himself but then able to sit with min guard assist Balance Overall balance assessment: Needs assistance Sitting-balance support: Feet supported, Bilateral upper extremity supported Sitting balance-Leahy Scale: Fair Sitting balance - Comments: R lateral lean in sitting, requires assistance to upright himself but then able to sit with min guard assist Postural control: Posterior lean, Left lateral lean Standing balance support: During functional activity, Bilateral upper extremity supported Standing balance-Leahy Scale: Poor Standing balance comment: BUE on RW and min assist during standing/gait     Special needs/care consideration BiPAP/CPAP: no CPM: no Continuous Drip IV: feeding supplement per tube.  Dialysis: no  Days: no Life Vest: no Oxygen: no Special Bed: no Trach Size: no Wound Vac (area): no      Location: no Skin: abrasion to left knee, cracking location to both feet, new gastrostomy tube -location: LUQ   Bowel mgmt:continent, last BM 09/02/2019 Bladder mgmt: incontinent Diabetic mgmt: no Behavioral consideration : refers to his mother to speak for him.  Chemo/radiation : no Designated visitor is Mom, [name]      Previous Home Environment  Living Arrangements: Other relatives Available Help at Discharge: Family, Available PRN/intermittently Type of Home: Apartment Home Layout: One level Home Access: Level entry Bathroom Shower/Tub: Armed forces training and education officer: Yes Home Care Services: No   Discharge Living Setting Plans  for Discharge Living Setting: Patient's home, Lives with (comment), Apartment(sister) Type of Home at Discharge: Apartment Discharge Home Layout: Two level Alternate Level Stairs-Rails: Can reach both Alternate Level Stairs-Number of Steps: 12 Discharge Home Access: Stairs to enter Entrance Stairs-Rails: Can reach both Entrance Stairs-Number of Steps: 6 Discharge Bathroom Shower/Tub: Tub/shower unit Discharge Bathroom Toilet: Standard Discharge Bathroom Accessibility: Yes How Accessible: Accessible via walker Does the patient have any problems obtaining your medications?: Yes (Describe)(has not been able to afford HIV medications)   Social/Family/Support Systems Patient Roles: Other (Comment)(employee at [employer], has supportive mother and sister) Contact Information: mother [name]): [phone]; sister Rozanna Box): [phone] Anticipated Caregiver: sister (during the day) as she works from home, mother to assist as fill in, and they have a Comptroller who can assist as well Anticipated Caregiver's Contact Information: see above Ability/Limitations of Caregiver: Min/Mod A (sister is CNA) Caregiver Availability: 24/7 Discharge Plan Discussed with Primary Caregiver: Yes(sister and mother, and patient) Is Caregiver In Agreement with Plan?: Yes Does Caregiver/Family have Issues with Lodging/Transportation while Pt is in Rehab?: No     Goals/Additional Needs Patient/Family Goal for Rehab: PT: supervision; OT: Supervision/Min A; SLP: Min A Expected length of stay: 12-16 days Cultural Considerations: NA Dietary Needs: NPO Equipment Needs: TBD Special Service Needs: has cortrack and new PEG just placed 12/7;  Pt/Family Agrees to Admission and willing to participate: Yes Program Orientation Provided & Reviewed with Pt/Caregiver Including Roles  & Responsibilities: Yes(pt and mother)  Barriers to Discharge: Medical stability, Home environment access/layout, Incontinence, Insurance for SNF  coverage  Barriers to Discharge Comments: medicaid pending. steps to enter home. will need to be 1 person assist for most of the day.      Decrease burden of Care through IP rehab admission: NA     Possible need for SNF placement upon discharge:Not anticipated; pt has great family support at DC from his mother, sister, other family members, and a family friend who is a Comptroller. Pt's sister is a CNA and his mother is very involved in his care. He will have 24/7 A at DC. If pt can progress to 1 person assist level he will be able to transition home safely.      Patient Condition: This patient's medical and functional status has changed since the consult dated 11/27 in which the Rehabilitation Physician documented that the patient was not appropriate for intensive rehabilitative care in an inpatient rehabilitation facility. Due to change in status and issues being addressed, patient's case has been discussed with Dr. Riley Kill and patient now appropriate for inpatient rehabilitation. Pt has now been more consistent with his efforts with therapy, progressing to standing and ambulating, and is much more vocal and makes more attempts to guide his care. Will admit to inpatient rehab tomorrow 09/07/2019 when  bed is available.   Preadmission Screen Completed By: Cheri Rous  With brief updates by Clois Dupes, RN, 09/06/2019 12:14 PM ______________________________________________________________________   Discussed status with Dr. Riley Kill on 09/06/2019 at 1215 pm and received approval for admission tomorrow 09/07/2019 when bed is available.   Admission Coordinator: Cheri Rous with brief updates by  Clois Dupes, time 1214 pm Date 09/06/2019.          Cosigned by: Ranelle Oyster, MD at 09/06/2019  1:06 PM  Revision History Date/Time User Provider Type Action  09/06/2019  1:06 PM Ranelle Oyster, MD Physician Cosign  09/06/2019 12:34 PM Standley Brooking, RN Rehab Admission  Coordinator Sign  09/05/2019  6:33 PM Cheri Rous, OT Rehab Admission Coordinator Share   View Details Report

## 2019-09-09 NOTE — Progress Notes (Signed)
Physical Therapy Session Note  Patient Details  Name: Arthur [name] MRN: [id number] Date of Birth: 07/06/1981  Today's Date: 09/09/2019 PT Individual Time: 1120-1200 PT Individual Time Calculation (min): 40 min   Short Term Goals: Week 1:  PT Short Term Goal 1 (Week 1): Pt will ambulate 100 ft with LRAD and min assist PT Short Term Goal 2 (Week 1): Pt will ascend/descend 8 steps with rails and min assist PT Short Term Goal 3 (Week 1): Pt will perform sit<>stand with CGA  Skilled Therapeutic Interventions/Progress Updates: Pt presented in bed sleeping but easily aroused and agreeable to therapy. Pt denies pain during session. Performed supine to sit at EOB with minA and cues for sequencing with use of bed features. Pt performed stand pivot transfer to w/c with RW and minA with verbal cues for BLE placement due to scissoring steps. Pt transported to day room and performed stand pivot transfer in same manner. Pt then performed STS x 4 no AD for BLE strengthening and static balance. Pt noted with each stand to have significant posterior bias which pt was able to correct with mod multimodal cues. On final stand pt indicated need to use bathroom. Performed stand pivot to w/c min A and pt transported back to room. In room pt ambulated approx 22f to toilet with modA due to scissoring and poor management of RW. Pt noted to be incontinent of bladder and changed brief total A. Pt stood from toilet minA and required maxA for LB clothing management. Once completed pt ambulated back to w/c in same manner as prior. Pt transported back to day room and participated in gait training approx 574fwith RW and modA fading to minA with PTA providing verbal cues for stepping towards wheel of RW to decrease scissoring with fair carryover. Pt then transported back to room and performed ambulatory transfer back to bed with RW and minA. Performed sit to supine on flat bed with CGA and use of bed rail. Pt required minA to  reposition to comfort and left with bed alarm on, call bell within reach and needs met.      Therapy Documentation Precautions:  Precautions Precautions: Fall Precaution Comments: PEG, foley, visual field loss Rt eye Restrictions Weight Bearing Restrictions: No General:   Vital Signs: Therapy Vitals Temp: 98.4 F (36.9 C) Temp Source: Oral Pulse Rate: (!) 54 Resp: 13 BP: (!) 83/57(LPN notified ) Patient Position (if appropriate): Lying Oxygen Therapy SpO2: 95 % O2 Device: Room Air  Therapy/Group: Individual Therapy  Adalis Gatti  Hellena Pridgen, PTA  09/09/2019, 4:11 PM

## 2019-09-09 NOTE — Progress Notes (Signed)
Inpatient Rehabilitation  Patient information reviewed and entered into eRehab system by Kendarius Vigen M. Chariah Bailey, M.A., CCC/SLP, PPS Coordinator.  Information including medical coding, functional ability and quality indicators will be reviewed and updated through discharge.    

## 2019-09-09 NOTE — Care Management Note (Signed)
[facility] Individual Statement of Services  Patient Name:  EZRA DENNE  Date:  09/09/2019  Welcome to the [facility].  Our goal is to provide you with an individualized program based on your diagnosis and situation, designed to meet your specific needs.  With this comprehensive rehabilitation program, you will be expected to participate in at least 3 hours of rehabilitation therapies Monday-Friday, with modified therapy programming on the weekends.  Your rehabilitation program will include the following services:  Physical Therapy (PT), Occupational Therapy (OT), Speech Therapy (ST), 24 hour per day rehabilitation nursing, Neuropsychology, Case Management (Social Worker), Rehabilitation Medicine, Nutrition Services and Pharmacy Services  Weekly team conferences will be held on Wednesday to discuss your progress.  Your Social Worker will talk with you frequently to get your input and to update you on team discussions.  Team conferences with you and your family in attendance may also be held.  Expected length of stay: 2.5-3 weeks  Overall anticipated outcome: supervision-CGA level  Depending on your progress and recovery, your program may change. Your Social Worker will coordinate services and will keep you informed of any changes. Your Social Worker's name and contact numbers are listed  below.  The following services may also be recommended but are not provided by the [facility] will be made to provide these services after discharge if needed.  Arrangements include referral to agencies that provide these services.  Your insurance has been verified to be:  None-applying for Medicaid Your primary doctor is:  [facility]  Pertinent information will be shared with your doctor and  your insurance company.  Social Worker:  Ovidio Kin, [place] or ([phone]  Information discussed with and copy given to patient by: Elease Hashimoto, 09/09/2019, 10:11 AM

## 2019-09-09 NOTE — Progress Notes (Signed)
Speech Language Pathology Daily Session Note  Patient Details  Name: Arthur [name] MRN: [id number] Date of Birth: [birth year]  Today's Date: 09/09/2019 SLP Individual Time: 1400-1445 SLP Individual Time Calculation (min): 45 min  Short Term Goals: Week 1: SLP Short Term Goal 1 (Week 1): Pt will consume therapeutic trials of ice chips with minimal overt s/s of aspiration and min cues for use of swallowing precautions over 3 consecutive sessions prior to repeat instrumental assessment. SLP Short Term Goal 2 (Week 1): Pt will use a volitional cough or throat clear to clear his secretions and vocal wetness with mod verbal cues in 75% of opportunities. SLP Short Term Goal 3 (Week 1): Pt will increase vocal intensity and overarticulate to achieve speech intelligibility at the word level with mod assist verbal cues. SLP Short Term Goal 4 (Week 1): Pt will use external aids for recall of daily information with mod assist verbal and visual cues. SLP Short Term Goal 5 (Week 1): Pt will complete mildly complex tasks with min assist verbal cues for functional problem solving.  Skilled Therapeutic Interventions: Skilled ST services focused on swallow and speech skills. NTs were in room assisting pt in undressing and redressing following vomiting episode, NT and Nurse suggest due to bolus feed which occurred immediately before episode. SLP facilitated oral care with suction toothbrush and provided ice chip trials x6, pt demonstrated wet vocal quality with and without PO consumption. Pt produced delayed throat clear/cough 2 out 6 appeared to be productive, but only able to initiate re-swallow in 3 out 6 trials. Pt demonstrated constant anterior spillage of salvia throughout session. Pt demonstrated ability to produce intelligible speech at word level in responsive naming task with min A verbal cues, however given picture cards to list action pt required max A verbal cues for increase vocal intensity and  articulation. Pt began vomiting while sitting in chair leaning to right and sliding out of chair, SLP provided assistance to slowly and softly sit on the floor as pt continued to vomit in bag. SLP called for assisted as soon as pt began vomit but assistance did not arrive until after he was seated on the floor. Nurse and SLP assisted in transferring pt to bed and changing shirt. Nurse verbalized she would document assisted fall. Pt was left in room with call bell within reach and bed alarm set. ST recommends to continue skilled ST services.      Pain Pain Assessment Pain Score: 0-No pain  Therapy/Group: Individual Therapy  Quentyn Kolbeck  [facility] 09/09/2019, 3:03 PM

## 2019-09-09 NOTE — Progress Notes (Signed)
[facility] PHYSICAL MEDICINE & REHABILITATION PROGRESS NOTE   Subjective/Complaints: Patient seen sitting up in bed this morning.  He indicates he slept well overnight, slept fairly per sleep chart.  Limited engagement.  ROS: limited due to cognition/engagement.    Objective:   No results found. Recent Labs    09/08/19 0533 09/09/19 0648  WBC 2.2* 1.9*  HGB 11.5* 10.5*  HCT 35.1* 30.8*  PLT 207 210   Recent Labs    09/08/19 0533 09/09/19 0648  NA 134* 133*  K 4.5 4.2  CL 102 101  CO2 24 25  GLUCOSE 107* 91  BUN 14 13  CREATININE 0.88 0.78  CALCIUM 8.8* 9.0    Intake/Output Summary (Last 24 hours) at 09/09/2019 1310 Last data filed at 09/09/2019 0004 Gross per 24 hour  Intake 1000 ml  Output 400 ml  Net 600 ml     Physical Exam: Vital Signs Blood pressure 109/68, pulse 61, temperature 97.7 F (36.5 C), temperature source Oral, resp. rate 18, SpO2 99 %. Constitutional: No distress . Vital signs reviewed. HENT: Normocephalic.  Atraumatic. Eyes: EOMI. No discharge. Cardiovascular: No JVD. Respiratory: Normal effort.  No stridor. GI: Non-distended.  + PEG. Skin: Left forearm with dressing C/d/I Psych: Flat.  Disengaged. Musc: No edema in extremities.  No tenderness in extremities. Neurological: Alert  Minimal verbalization.  Motor: Grossly 4 -/5 throughout  Assessment/Plan: 1. 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  Physiatrist is providing close team supervision and 24 hour management of active medical problems listed below.  Physiatrist and rehab team continue to assess barriers to discharge/monitor patient progress toward functional and medical goals  Care Tool:  Bathing    Body parts bathed by patient: Right arm, Left arm, Chest, Abdomen, Front perineal area, Right upper leg, Left upper leg, Face   Body parts bathed by helper:  Buttocks, Right lower leg, Left lower leg     Bathing assist Assist Level: Moderate Assistance - Patient 50 - 74%     Upper Body Dressing/Undressing Upper body dressing   What is the patient wearing?: Pull over shirt    Upper body assist Assist Level: Moderate Assistance - Patient 50 - 74%    Lower Body Dressing/Undressing Lower body dressing      What is the patient wearing?: Pants, Incontinence brief     Lower body assist Assist for lower body dressing: Maximal Assistance - Patient 25 - 49%     Toileting Toileting Toileting Activity did not occur (Clothing management and hygiene only): Refused  Toileting assist Assist for toileting: Independent with assistive device Assistive Device Comment: urinal   Transfers Chair/bed transfer  Transfers assist     Chair/bed transfer assist level: Moderate Assistance - Patient 50 - 74%     Locomotion Ambulation   Ambulation assist      Assist level: Moderate Assistance - Patient 50 - 74% Assistive device: Walker-rolling Max distance: 52 ft   Walk 10 feet activity   Assist     Assist level: Moderate Assistance - Patient - 50 - 74% Assistive device: Walker-rolling   Walk 50 feet activity   Assist    Assist level: Moderate Assistance - Patient - 50 - 74% Assistive device: Walker-rolling    Walk 150 feet activity   Assist           Walk 10 feet on uneven surface  activity   Assist Walk 10  feet on uneven surfaces activity did not occur: Safety/medical concerns         Wheelchair     Assist Will patient use wheelchair at discharge?: No             Wheelchair 50 feet with 2 turns activity    Assist            Wheelchair 150 feet activity     Assist          Blood pressure 109/68, pulse 61, temperature 97.7 F (36.5 C), temperature source Oral, resp. rate 18, SpO2 99 %.  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 Antithrombotics: -DVT/anticoagulation:Pharmaceutical:Lovenox -antiplatelet therapy: N/A 3. Pain Management:Tramadol or Tylenol prn for abdominal pain. 4. Mood:LCSW to follow for evaluation and support -antipsychotic agents: N/A 5. Neuropsych: This patientis not fullycapable of making decisions on hisown behalf. 6. Skin/Wound Care:routine pressure relief measures 7. Fluids/Electrolytes/Nutrition:  -osmolite plus supps thru PEG             -NPO 8. 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 10. Malnutrition/Severe dysphagia: see above. 11. Hypomagnesemia: IV supplement as needed. added supplement thru peg.   Mag 1.8 12.  Labile blood pressure  Continue to monitor 13.  Hyponatremia  Sodium 133 on 12/14  Continue to monitor 14.  Leukopenia-secondary to medications  WBCs 1.9 on 12/14  Continue to monitor 15.  Acute blood loss anemia  Hemoglobin 10.5 on 12/14  Continue to monitor    LOS: 2 days A FACE TO FACE EVALUATION WAS PERFORMED  Jessic Standifer Karis Juba 09/09/2019, 1:10 PM

## 2019-09-09 NOTE — Plan of Care (Signed)
  Problem: Consults Goal: RH GENERAL PATIENT EDUCATION Description: See Patient Education module for education specifics. Outcome: Progressing   Problem: RH BOWEL ELIMINATION Goal: RH STG MANAGE BOWEL WITH ASSISTANCE Description: STG Manage Bowel with min Assistance. Outcome: Progressing   Problem: RH BLADDER ELIMINATION Goal: RH STG MANAGE BLADDER WITH ASSISTANCE Description: STG Manage Bladder With min Assistance Outcome: Progressing   Problem: RH SKIN INTEGRITY Goal: RH STG SKIN FREE OF INFECTION/BREAKDOWN Description: No new skin breakdown Outcome: Progressing Goal: RH STG MAINTAIN SKIN INTEGRITY WITH ASSISTANCE Description: STG Maintain Skin Integrity With min Assistance. Outcome: Progressing Goal: RH STG ABLE TO PERFORM INCISION/WOUND CARE W/ASSISTANCE Description: STG Able To Perform Incision/Wound Care With min Assistance. Outcome: Progressing   Problem: RH SAFETY Goal: RH STG ADHERE TO SAFETY PRECAUTIONS W/ASSISTANCE/DEVICE Description: STG Adhere to Safety Precautions With min Assistance/Device. Outcome: Progressing   Problem: RH KNOWLEDGE DEFICIT GENERAL Goal: RH STG INCREASE KNOWLEDGE OF SELF CARE AFTER HOSPITALIZATION Outcome: Progressing   

## 2019-09-09 NOTE — Progress Notes (Signed)
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 Alvino Chapelhoi     HPI: Albertina ParrMichael J Dzik is a 38 y.o. male with history of HIV, depression, anxiety, substance abuse, recent positive RPR,  who was admitted on 07/28/19 with dizziness, headaches and weakness. Patient had not taken his HIV meds X 1 year and reported loss of vision in right eye. and MRI brain done revealing widespread  areas of diffusion with abnormal contrast both cerebral hemispheres predominately in left anterior frontal matter concerning for opportunistic infection as well as premature generalized atrophy. 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 ID following for input on treatment and on pyrimethamine-Leucovorin and sulfadiazine with fluconazole for oral candidiasis. Follow up MRI showing response to treatment.  PEG recommended due to severe protein calorie malnutrition.  Palliative care consulted to discuss goals of care--mother has elected on aggressive treatment and has refused SNF.   Patient limited by significant debility and able to communicate with multimodal cues and follow 1 step commands with 75% accuracy. CIR recommended due to functional decline.      ROS: Unable to obtain due to cognitive/mental status issues.         Past Medical History:  Diagnosis Date  . Anemia    . Anxiety    . Depression    . GERD (gastroesophageal reflux disease)    . HIV infection (HCC)    . Substance abuse [facility](HCC)           Past Surgical History:  Procedure  Laterality Date  . TOOTH EXTRACTION               Family History  Problem Relation Age of Onset  . Diabetes Neg Hx    . CAD Neg Hx    . Hypertension Neg Hx        Social History:  reports that he has been smoking. He has a 6.80 pack-year smoking history. He has never used smokeless tobacco. He reports current drug use. Drug: Marijuana. He reports that he does not drink alcohol.      Allergies: No Known Allergies            Medications Prior to Admission  Medication Sig Dispense Refill  . ondansetron (ZOFRAN ODT) 8 MG disintegrating tablet Take 1 tablet (8 mg total) by mouth every 8 (eight) hours as needed for nausea or vomiting. 20 tablet 0  . ritonavir (NORVIR) 100 MG TABS tablet Take 1 tablet (100 mg total) by mouth daily. (Patient not taking: Reported on 01/22/2018) 30 tablet 5  . TRUVADA 200-300 MG per tablet TAKE 1 TABLET BY MOUTH DAILY (Patient not taking: Reported on 01/22/2018) 30 tablet 0      Home: Home Living Family/patient expects to be discharged to:: Private residence Living Arrangements: Other relatives Available Help at Discharge: Family, Available PRN/intermittently Type of Home: Apartment Home Access: Level entry Home Layout: One level Bathroom Shower/Tub: Nurse, adultTub/shower unit Bathroom Accessibility: Yes Home Equipment: None  Functional History:  Prior Function Level of Independence: Independent Functional Status:  Mobility: Bed Mobility Overal bed mobility: Needs Assistance Bed Mobility: Supine to Sit Supine to sit: Max assist, +2 for physical assistance, +2 for safety/equipment Sit to supine: Total assist, +2 for physical assistance General bed mobility comments: assist for mgmt of LEs/trunk; increased time required to initate and process tasks  Transfers Overall transfer level: Needs assistance Equipment used: Ambulation equipment used Transfer via Lift Equipment: Stedy Transfers: Sit to/from Stand, Pharmacologist Sit to Stand: Max assist, Mod  assist, +2 physical assistance, +2 safety/equipment Stand pivot transfers: Total assist, +2 physical assistance, +2 safety/equipment General transfer comment: max assist +2 from EOB into steady, min-mod assist +2 standing from elevated Stedy seat and tactile cues for L hip extension; pivot to recliner using stedy; pt initiated RUE grip on Stedy well with cues, requiring assist to manage LLE Ambulation/Gait Ambulation/Gait assistance: +2 physical assistance, Max assist, +2 safety/equipment Gait Distance (Feet): (4-5) Assistive device: Rolling walker (2 wheeled) Gait Pattern/deviations: Step-to pattern, Decreased step length - right, Decreased step length - left, Narrow base of support, Scissoring General Gait Details: assistance required for balance/weight shifting and LE placement; pt with tendency to scissor adn with R lateral bias Gait velocity: decr Gait velocity interpretation: <1.31 ft/sec, indicative of household ambulator   ADL: ADL Overall ADL's : Needs assistance/impaired Eating/Feeding: NPO Grooming: Wash/dry face, Set up, Bed level, Moderate assistance Grooming Details (indicate cue type and reason): setup with wet washcloth to wipe lips with supervision; seated in recliner mod assist to suction mouth to support R elbow to bring hand to mouth Upper Body Bathing: Moderate assistance, Bed level Upper Body Bathing Details (indicate cue type and reason): pt using R hand to wash chest and L underarm. pt using bil Ue to apply deordorant to each arm pit. pt total (A) for R arm and under arm. Pt with deep breath after task  Lower Body Bathing: Minimal assistance, Sit to/from stand Upper Body Dressing : Maximal assistance, Sitting Lower Body Dressing: Maximal assistance, Bed level Toilet Transfer: Maximal assistance, +2 for physical assistance, +2 for safety/equipment Toilet Transfer Details (indicate cue type and reason): using stedy, simulated to recliner  Toileting- Clothing  Manipulation and Hygiene: Minimal assistance Functional mobility during ADLs: Moderate assistance, Maximal assistance, +2 for physical assistance, +2 for safety/equipment General ADL Comments: pt limited by impaired cognition, weakness, balance and activity tolerance   Cognition: Cognition Overall Cognitive Status: Difficult to assess Orientation Level: Other (comment) Cognition Arousal/Alertness: Awake/alert Behavior During Therapy: Flat affect Overall Cognitive Status: Difficult to assess Area of Impairment: Problem solving, Safety/judgement, Following commands, Attention, Awareness Current Attention Level: Sustained Following Commands: Follows one step commands with increased time, Follows one step commands inconsistently Safety/Judgement: Decreased awareness of deficits, Decreased awareness of safety Awareness: Intellectual Problem Solving: Slow processing, Requires verbal cues, Requires tactile cues, Decreased initiation, Difficulty sequencing General Comments: pt is nonverbal during session, commuincating with hand gestures and head nods; pt following approx 75% of commands with increased time (fading when fatigued), requires multimodal cueing; mother reports pt talking some words to her, baseline ADHD and not very social Difficult to assess due to: Impaired communication     Blood pressure 107/75, pulse (!) 52, temperature 97.8 F (36.6 C), temperature source Axillary, resp. rate 18, height  (1.803 m), weight 50.9 kg, SpO2 97 %. Physical Exam Gen: no distress, ill appearing, lying in bed, tries to take NGT out.  HEENT: oral mucosa pink and moist, NCAT Cardio: Reg  rate Chest: normal effort, normal rate of breathing Abd: soft, non-distended Ext: no edema Skin: intact Neuro: Unable to answer orientation questions. Unable to focus gaze on me. Can follow simple commands such as raising his legs and arms but unable to meaningfully participate in musculoskeletal and neurological  examination.  Psych: Flat affect.    Lab Results Last 24 Hours       Results for orders placed or performed during the hospital encounter of 07/28/19 (from the past 24 hour(s))  Glucose, capillary     Status: Abnormal    Collection Time: 08/22/19 11:13 AM  Result Value Ref Range    Glucose-Capillary 112 (H) 70 - 99 mg/dL  Glucose, capillary     Status: Abnormal    Collection Time: 08/22/19  4:31 PM  Result Value Ref Range    Glucose-Capillary 109 (H) 70 - 99 mg/dL  Glucose, capillary     Status: Abnormal    Collection Time: 08/22/19  7:58 PM  Result Value Ref Range    Glucose-Capillary 118 (H) 70 - 99 mg/dL  Glucose, capillary     Status: Abnormal    Collection Time: 08/22/19 11:50 PM  Result Value Ref Range    Glucose-Capillary 141 (H) 70 - 99 mg/dL    Comment 1 Notify RN      Comment 2 Document in Chart    Basic metabolic panel daily     Status: Abnormal    Collection Time: 08/23/19  2:29 AM  Result Value Ref Range    Sodium 145 135 - 145 mmol/L    Potassium 4.3 3.5 - 5.1 mmol/L    Chloride 113 (H) 98 - 111 mmol/L    CO2 23 22 - 32 mmol/L    Glucose, Bld 105 (H) 70 - 99 mg/dL    BUN 40 (H) 6 - 20 mg/dL    Creatinine, Ser 6.25 0.61 - 1.24 mg/dL    Calcium 9.3 8.9 - 63.8 mg/dL    GFR calc non Af Amer >60 >60 mL/min    GFR calc Af Amer >60 >60 mL/min    Anion gap 9 5 - 15  Glucose, capillary     Status: Abnormal    Collection Time: 08/23/19  3:46 AM  Result Value Ref Range    Glucose-Capillary 108 (H) 70 - 99 mg/dL    Comment 1 Notify RN      Comment 2 Document in Chart         Imaging Results (Last 48 hours)  Mr Laqueta Jean Wo Contrast   Result Date: 08/21/2019 CLINICAL DATA:  Dizziness, weakness and headache. History of HIV. EXAM: MRI HEAD WITHOUT AND WITH CONTRAST TECHNIQUE: Multiplanar, multiecho pulse sequences of the brain and surrounding structures were obtained without and with intravenous contrast. CONTRAST:  4.51mL GADAVIST GADOBUTROL 1 MMOL/ML IV SOLN  COMPARISON:  None. FINDINGS: BRAIN: There is multifocal abnormal diffusion restriction within both cerebral hemispheres, greatest along the cortices of the medial frontal lobes. There is widespread hyperintense T2-weighted signal abnormality, greatest in the cerebellum. These findings are new compared to the prior MRI. There is abnormal contrast enhancement in the bilateral frontal and left temporal lobes. There is no extra-axial collection. No midline shift or other mass effect. No hydrocephalus. VASCULAR: The major intracranial arterial and venous sinus flow voids are normal. Susceptibility-sensitive sequences show no chronic microhemorrhage or superficial siderosis. SKULL AND UPPER CERVICAL SPINE: Calvarial bone marrow signal is normal. There is no skull base mass. The visualized upper cervical spine and  soft tissues are normal. SINUSES/ORBITS: There are no fluid levels or advanced mucosal thickening. The mastoid air cells and middle ear cavities are free of fluid. The orbits are normal. IMPRESSION: 1. Multifocal signal abnormality and abnormal contrast enhancement within both cerebral and cerebellar hemispheres. In this clinical scenario, the findings are most consistent with infectious cerebritis, and the pattern is compatible with cerebral toxoplasmosis. 2. No discrete abscess, though the largest area of contrast enhancement in the left frontal lobe may be in the early stages of abscess formation. 3. No hemorrhage or mass effect. Electronically Signed   By: Ulyses Jarred M.D.   On: 08/21/2019 22:33     Assessment/Plan: Diagnosis: Impaired mobility and ADLs secondary to toxoplasmosis 1. Does the need for close, 24 hr/day medical supervision in concert with the patient's rehab needs make it unreasonable for this patient to be served in a less intensive setting? No.  2. Co-Morbidities requiring supervision/potential complications: HIV, substance abuse, depression, GERD 3. Due to safety, skin/wound care,  disease management, medication administration and patient education, does the patient require 24 hr/day rehab nursing? Yes 4. Does the patient require coordinated care of a physician, rehab nurse, therapy disciplines of OT, PT, SLP to address physical and functional deficits in the context of the above medical diagnosis(es)? Yes Addressing deficits in the following areas: balance, endurance, locomotion, strength, transferring, bowel/bladder control, bathing, dressing, feeding, grooming, toileting, cognition, speech, language, swallowing and psychosocial support 5. Can the patient actively participate in an intensive therapy program of at least 3 hrs of therapy per day at least 5 days per week? No 6. The potential for patient to make measurable gains while on inpatient rehab is poor 7. Anticipated functional outcomes upon discharge from inpatient rehab are mod assist  with PT, mod assist with OT, mod assist with SLP. 8. Estimated rehab length of stay to reach the above functional goals is: 10-14 days 9. Anticipated discharge destination: Home 10. Overall Rehab/Functional Prognosis: poor   RECOMMENDATIONS: This patient's condition is appropriate for continued rehabilitative care in the following setting: SNF Patient has agreed to participate in recommended program. N/A Note that insurance prior authorization may be required for reimbursement for recommended care.   Comment: Mr. Gallick is currently MaxA to total assist for transfers and remains very weak. He has been unable to attempt standing thus far. He would be more appropriately served by SNF where he can receive a longer duration of care.    I have personally performed a face to face diagnostic evaluation, including, but not limited to relevant history and physical exam findings, of this patient and developed relevant assessment and plan.  Additionally, I have reviewed and concur with the physician assistant's documentation above.   Leeroy Cha, MD     Bary Leriche, PA-C 08/23/2019        Revision History Date/Time User Provider Type Action  08/26/2019 12:38 PM Ranell Patrick Clide Deutscher, MD Physician Sign  08/23/2019 10:41 AM Love, Ivan Anchors, PA-C Physician Assistant Share   View Details Report     Routing History

## 2019-09-09 NOTE — Progress Notes (Signed)
Initial Nutrition Assessment  RD working remotely.  DOCUMENTATION CODES:   Underweight, suspect malnutrition persists but RD unable to confirm at this time without NFPE  INTERVENTION:   Initiate bolus tube feeding regimen via PEG: - 1.5 cans (355 ml) of Osmolite 1.5 cal formula 4 times daily (total of 6 cans daily) - Pro-stat 30 ml BID per tube - Free water per MD/PA, currently 300 ml TID  Tube feeding regimen provides 2333 kcal, 119 grams of protein, and 1984 ml of H2O.   - Continue liquid MVI daily per tube  NUTRITION DIAGNOSIS:   Inadequate oral intake related to dysphagia as evidenced by NPO status.  GOAL:   Patient will meet greater than or equal to 90% of their needs  MONITOR:   Diet advancement, Labs, Weight trends, TF tolerance, Skin, I & O's  REASON FOR ASSESSMENT:   Consult Enteral/tube feeding initiation and management  ASSESSMENT:   38 year old male with PMH of HIV, substance abuse, depression, GERD. Pt presented to ED on 07/28/19 with dizziness, weakness, and intermittent headaches. Pt had been noncompliant with his HIV drugs due to lack of insurance/affordibility issues. MRI brain done revealing widespread areas of diffusion abnormal contrast in both cerebral hemispheres predominantly left anterior frontal matter concerning for opportunistic infection as well as premature generalized atrophy. 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 Palliative care consulted to discuss [place] and mother elected on aggressive treatment.  He has been maintained on tube feeds with NPO due to severe dysphagia with difficulty handing oral secretions. PEG placed on 12/07. Pt admitted to CIR on 09/07/19.   Reviewed weights in chart. Pt with a 12.9 kg weight loss since 07/22/19. This is a 21% weight loss which is severe and significant for timeframe. No CIR admission  weight noted.  Pt with previous diagnosis of severe malnutrition. Suspect malnutrition persists but RD unable to confirm without NFPE.  Discussed plan with PA. Will transition to bolus TF regimen.  Discussed plan with RN via phone call.  Current TF: Osmolite 1.5 @ 65 ml/hr, Pro-stat 30 ml daily, free water 100 ml TID  Medications reviewed and include: Pepcid, SSI, magnesium oxide 200 mg BID, liquid MVI  Labs reviewed: sodium 133 CBG's: 73-116 x 24 hours  UOP: 525 ml x 24 hours  NUTRITION - FOCUSED PHYSICAL EXAM:  Unable to complete at this time. RD working remotely.  Diet Order:   Diet Order            Diet NPO time specified  Diet effective midnight              EDUCATION NEEDS:   No education needs have been identified at this time  Skin:  Skin Assessment: Skin Integrity Issues: Stage II: sacrum  Last BM:  09/08/19  Height:   Ht Readings from Last 1 Encounters:  07/29/19 5\' 11"  (1.803 m)    Weight:   Wt Readings from Last 1 Encounters:  09/04/19 48.3 kg    Ideal Body Weight:  78.2 kg  BMI:  14.86 kg/m^2  Estimated Nutritional Needs:   Kcal:  2200-2400  Protein:  105-120 grams  Fluid:  >/= 1.8 L    Gaynell Face, MS, RD, LDN Inpatient Clinical Dietitian Pager: [phone] Weekend/After Hours: [phone]

## 2019-09-09 NOTE — Progress Notes (Signed)
Physical Therapy Session Note  Patient Details  Name: Arthur [name] MRN: [id number] Date of Birth: 1981/05/13  Today's Date: 09/09/2019 PT Individual Time: 1300-1340 PT Individual Time Calculation (min): 40 min   Short Term Goals: Week 1:  PT Short Term Goal 1 (Week 1): Pt will ambulate 100 ft with LRAD and min assist PT Short Term Goal 2 (Week 1): Pt will ascend/descend 8 steps with rails and min assist PT Short Term Goal 3 (Week 1): Pt will perform sit<>stand with CGA  Skilled Therapeutic Interventions/Progress Updates:    Pt asleep and reports fatigue from earlier sessions. Agreeable to get up to recliner and sit up OOB to increase tolerance but declines leaving room this session. Focused on NMR during bed mobility, bed -> chair transfer, blocked practice sit <> stands, and coordination and balance exercises/pre-gait activities. CGA for supine to sit with use of bed rails for support. Mod assist for stand step transfer to recliner with cues for safe technique and decreased postural control noted with posterior bias. Blocked practice sit <> stands with focus on technique and hand placement to increase anterior weightshift and transitional movement with min to mod assist (poor controlled descent with tendency for trunk LOB to the R in the chair) x 5 reps with rest breaks due to fatigue. In standing engaged in marching with focus on coordination of foot placement and balance during single limb stance (min to mod assist), heel and toe raises x 10 reps each, forward and retro gait x 10' each trial x 3 reps with extended rest break. RN present in room at end of session.   Therapy Documentation Precautions:  Precautions Precautions: Fall Precaution Comments: PEG, foley, visual field loss Rt eye Restrictions Weight Bearing Restrictions: No Pain: Denies pain. Reports fatigue from therapies today   Therapy/Group: Individual Therapy  Canary Brim Ivory Broad, PT, DPT,  CBIS  09/09/2019, 3:01 PM

## 2019-09-09 NOTE — Progress Notes (Signed)
Social Work Assessment and Plan   Patient Details  Name: Arthur [name] MRN: [id number] Date of Birth: 28-Sep-1980  Today's Date: 09/09/2019  Problem List:  Patient Active Problem List   Diagnosis Date Noted  . Debility 09/07/2019  . Aspiration into airway   . Palliative care by specialist   . DNR (do not resuscitate) discussion   . Weakness generalized   . Dysphagia   . Protein-calorie malnutrition, severe 08/19/2019  . 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 Goals of care, counseling/discussion   . Bacterial meningitis   . Thrush of mouth and esophagus (HCC)   . Palliative care encounter   . HIV (human immunodeficiency virus infection) (HCC) 07/29/2019  . Vision loss 07/29/2019  . Abnormal brain MRI 07/29/2019  . Acquired immunodeficiency syndrome (HCC) 07/28/2019  . Bradycardia 07/28/2019  . Hyponatremia 07/28/2019  . Nausea and vomiting 03/25/2013  . Muscle spasm 03/25/2013  . Drug use 01/11/2012  . 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 COUGH 06/23/2009  . CHEST PAIN, PLEURITIC 06/23/2009  . FUNGAL DERMATITIS 05/28/2008  . GERD 05/28/2008  . DENTAL CARIES 02/13/2008  . NICOTINE ADDICTION 11/14/2007  . BACK STRAIN, LUMBAR 08/22/2007  . 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 WEIGHT LOSS, RECENT 05/01/2007  . ANXIETY DEPRESSION 02/14/2007  . HIV DISEASE 02/13/2007  . CERVICAL LYMPHADENOPATHY, ANTERIOR, RIGHT 02/13/2007   Past Medical History:  Past Medical History:  Diagnosis Date  . Anemia   . Anxiety   . Depression   . GERD (gastroesophageal reflux disease)   . HIV infection (HCC)   . Substance abuse [facility])    Past Surgical History:  Past Surgical History:  Procedure Laterality Date  . IR GASTROSTOMY TUBE MOD SED  09/02/2019  . TOOTH EXTRACTION     Social History:  reports that he has been smoking. He has a 6.80 pack-year smoking history. He has never used smokeless tobacco. He reports current drug use. Drug: Marijuana. He reports that he does not drink  alcohol.  Family / Support Systems Marital Status: Single Patient Roles: Other (Comment)(Son and sibling) Other Supports: Meredith Leeds [phone]  Tamie[place]-sister [phone] Anticipated Caregiver: Sister to assist during the day-working from home and Mom to fill in also works days. Their is a friend who is a Comptroller and willing to help Ability/Limitations of Caregiver: All together will work on 24 hr care plan Caregiver Availability: 24/7 Family Dynamics: Close knit with one another, sister, Mom and friend. All care for one another and will do what is needed to assist pt as he would them if they were in the situation. Mom is the visitor and is here daily to provide support to pt  Social History Preferred language: English Religion: Non-Denominational Cultural Background: No issues Education: HS Read: Yes Write: Yes Employment Status: Unemployed Date Retired/Disabled/Unemployed: Applying for SSD and medicaid Legal History/Current Legal Issues: No issues Guardian/Conservator: None-according to MD pt is not fully capable of making his own decisions while here. Will look toward his Mom since she is next of kin and Mom will involve sister all three are his main supports.   Abuse/Neglect Abuse/Neglect Assessment Can Be Completed: Yes Physical Abuse: Denies Verbal Abuse: Denies Sexual Abuse: Denies Exploitation of patient/patient's resources: Denies Self-Neglect: Denies  Emotional Status Pt's affect, behavior and adjustment status: Pt is oriented to self and place, he is quiet and also has dysphagia. He defers to his Mom to talk and answer questions. He was independent prior to admission and wants to be again. He has not been getting  the medical care and medicaitons he needed due to lack of insurance prior to admission. Recent Psychosocial Issues: other health issues and lack of income and insurance Psychiatric History: No history deferred depression screen due to pt exhausted from  therapies this am. Do feel he would benefit from seeing neuro-psych while here for coping due to the losses he has endured-loss of vision and function and severity of his illness. Will make referral when appropriate Substance Abuse History: marijuana-tobacco but no other drugs. Doesn't feel an issue can stop when wants too. Will continue to discuss while here  Patient / Family Perceptions, Expectations & Goals Pt/Family understanding of illness & functional limitations: Pt has a basic understanding of his deficits, Mom can explain his issues and medical issues, she is here daily and talks with the staff and MD to find out his daily medical issues and treatments. She feels she has a good understanding of his treatment and plans. Premorbid pt/family roles/activities: Son, Sibling, friend, etc Anticipated changes in roles/activities/participation: resume Pt/family expectations/goals: Pt states: " I want to get better."  Mom states: " He is doing so well from where he started from, and I continue to be hopeful he will do well and recover, no one knows how well he will do here."  [employer]: None Premorbid Home Care/DME Agencies: None Transportation available at discharge: Family members Resource referrals recommended: Neuropsychology, Support group (specify)  Discharge Planning Living Arrangements: Other relatives Support Systems: Parent, Other relatives, Friends/neighbors Type of Residence: Private residence Insurance Resources: Self-pay(Pending Medicaid-has applied while here) Museum/gallery curator Resources: Family Support(was not employed prior to admission) Museum/gallery curator Screen Referred: Yes Living Expenses: Lives with family Money Management: Family Does the patient have any problems obtaining your medications?: Yes (Describe)(uninsured) Home Management: Sister Patient/Family Preliminary Plans: Return home with sister who is currenlty working from home and is also a Quarry manager. His  Mom works but will be assisting after work and also a friend who is a Actuary and can assist. All are aware he will require 24 hr care at discharge from Rehab. Mom is pleased with the progress he has made already and feels he will surprise [place] and do better than people think. Will work on discharge needs. Sw Barriers to Discharge: Medication compliance, Other (comments) Sw Barriers to Discharge Comments: Non-compliant with medications and uninsured-was not seeing MD prior to admission Social Work Anticipated Follow Up Needs: HH/OP, Support Group  Clinical Impression Pleasant gentleman who has multiple medical issues, but doing better and improving. His Mom and sister are very involved and willing to assist him at discharge. Aware he will require 24 hr care upon discharge. Will make neuro-psych referral when appropriate do feel he would benefit from talking with him. Has applied for SSD and Medicaid while here. Will work on getting a him a PCP for follow up from here. Work on discharge needs.  Elease Hashimoto 09/09/2019, 12:36 PM

## 2019-09-09 NOTE — Progress Notes (Signed)
Occupational Therapy Session Note  Patient Details  Name: Arthur [name] MRN: [id number] Date of Birth: 1980/10/04  Today's Date: 09/09/2019 OT Individual Time: 1443-1540 OT Individual Time Calculation (min): 55 min   Short Term Goals: Week 1:  OT Short Term Goal 1 (Week 1): Pt will don footwear with Mod A OT Short Term Goal 2 (Week 1): Pt will complete toilet transfer with Min A using LRAD OT Short Term Goal 3 (Week 1): Pt will complete 1 grooming task while standing at the sink to improve balance  Skilled Therapeutic Interventions/Progress Updates:    Pt greeted in bed with no c/o pain. Stated his mom called him this morning which he was glad about. Min A for supine<sit. Mod A for ambulatory transfer from bed (towards windows) to w/c placed at sink. Pt used RW at this time with max vcs to "keep feet apart" due to scissoring. He then completed bathing/dressing tasks sit<stand at the sink. Able to utilize figure 4 but required A to doff gripper socks and don Rt sock. Vcs for wringing out wash cloths and being mindful to keep PEG dressings dry. Mod A for sit<stand at the sink with facilitation for anterior weight shift. Pt with posterior bias/LOBs in standing when completing pericare and elevating pants over hips, needing Mod A for balance. Mod A for donning overhead shirt as well. He had a tough time using the sanitizer to wash his hands due to FM deficits bilaterally. Note he also dropped the bottle and needed help to open cap. Pt required vcs and assist to don 1 pillowcase for his bed due to problem solving deficits and decreased proprioceptive awareness with Lt UE. At end of session he completed squat pivot<bed with Mod A using the bedrail. Pt needed vcs to obtain neutral alignment in bed due to Lt lean. Left him with all needs within reach, bed alarm set, and HOB at 30 degrees.    Therapy Documentation Precautions:  Precautions Precautions: Fall Precaution Comments: PEG, foley, visual field  loss Rt eye Restrictions Weight Bearing Restrictions: No ADL: ADL Eating: NPO Grooming: Moderate assistance Where Assessed-Grooming: Bed level, Edge of bed Upper Body Bathing: Supervision/safety Where Assessed-Upper Body Bathing: Edge of bed Lower Body Bathing: Moderate assistance Where Assessed-Lower Body Bathing: Edge of bed Upper Body Dressing: Minimal assistance Where Assessed-Upper Body Dressing: Edge of bed Lower Body Dressing: Maximal assistance Where Assessed-Lower Body Dressing: Edge of bed Toileting: Not assessed Toilet Transfer: Moderate assistance Toilet Transfer Method: Stand pivot Toilet Transfer Equipment: Engineer, technical sales Transfer: Not assessed     Therapy/Group: Individual Therapy  Jeremy Ditullio A Arthur [name] 09/09/2019, 12:20 PM

## 2019-09-10 ENCOUNTER — Inpatient Hospital Stay (HOSPITAL_COMMUNITY): Payer: Self-pay

## 2019-09-10 ENCOUNTER — Inpatient Hospital Stay (HOSPITAL_COMMUNITY): Payer: Self-pay | Admitting: Occupational Therapy

## 2019-09-10 ENCOUNTER — Inpatient Hospital Stay (HOSPITAL_COMMUNITY): Payer: Self-pay | Admitting: Physical Therapy

## 2019-09-10 DIAGNOSIS — B582 Toxoplasma meningoencephalitis: Secondary | ICD-10-CM

## 2019-09-10 DIAGNOSIS — R0989 Other specified symptoms and signs involving the circulatory and respiratory systems: Secondary | ICD-10-CM

## 2019-09-10 DIAGNOSIS — D62 Acute posthemorrhagic anemia: Secondary | ICD-10-CM

## 2019-09-10 DIAGNOSIS — B20 Human immunodeficiency virus [HIV] disease: Secondary | ICD-10-CM

## 2019-09-10 DIAGNOSIS — D72819 Decreased white blood cell count, unspecified: Secondary | ICD-10-CM

## 2019-09-10 LAB — GLUCOSE, CAPILLARY
Glucose-Capillary: 122 mg/dL — ABNORMAL HIGH (ref 70–99)
Glucose-Capillary: 69 mg/dL — ABNORMAL LOW (ref 70–99)
Glucose-Capillary: 72 mg/dL (ref 70–99)
Glucose-Capillary: 73 mg/dL (ref 70–99)
Glucose-Capillary: 73 mg/dL (ref 70–99)
Glucose-Capillary: 75 mg/dL (ref 70–99)
Glucose-Capillary: 77 mg/dL (ref 70–99)
Glucose-Capillary: 88 mg/dL (ref 70–99)

## 2019-09-10 MED ORDER — DEXTROSE 50 % IV SOLN
INTRAVENOUS | Status: AC
  Administered 2019-09-10: 01:00:00 25 mL
  Filled 2019-09-10: qty 50

## 2019-09-10 MED ORDER — OSMOLITE 1.5 CAL PO LIQD
355.0000 mL | ORAL | Status: DC
  Administered 2019-09-10 (×2): 355 mL
  Filled 2019-09-10 (×2): qty 474

## 2019-09-10 MED ORDER — OSMOLITE 1.5 CAL PO LIQD
355.0000 mL | Freq: Four times a day (QID) | ORAL | Status: DC
  Administered 2019-09-10 – 2019-09-11 (×3): 355 mL

## 2019-09-10 NOTE — Progress Notes (Signed)
Physical Therapy Session Note  Patient Details  Name: Arthur [name] MRN: [id number] Date of Birth: 06/28/1981  Today's Date: 09/10/2019 PT Individual Time: 1000-1100 PT Individual Time Calculation (min): 60 min   Short Term Goals: Week 1:  PT Short Term Goal 1 (Week 1): Pt will ambulate 100 ft with LRAD and min assist PT Short Term Goal 2 (Week 1): Pt will ascend/descend 8 steps with rails and min assist PT Short Term Goal 3 (Week 1): Pt will perform sit<>stand with CGA  Skilled Therapeutic Interventions/Progress Updates: Pt presented in bed agreeable to therapy. Pt denies pain during session. Performed supine to sit at EOB minA with use of bed features. Pt noted to be incontinent of urine and had also leaked through onto bed. PTA doffed brief and pt performed STS with minA to allow PTA to perform peri-care for time management. Upon standing pt with heavy posterior lean requiring mod multimodal cues for correction. Once PTA completed peri-care and donned new brief pt returned to sitting to allow PTA to don pants maxA for time management. Pt returned to standing minA for STS modA for maintaining static balance initially to correct lean pt was able to pull pants over hips with minA and increased time. Pt then performed ambulatory transfer to w/c and transported to rehab gym. Performed squat pivot to mat minA and participated in standing balance activities. Participated in toe taps to 4in step 2 x 10 bilaterally. PTA placed yoga block between pt's feet to provide feedback to increase BOS as pt would scissor to tap step then place foot almost on top of other foot. Pt was able to improve BOS with yoga block placed. PTA provided mod/max multimodal cues to correct posterior lean during toe taps with pt able to correct but unable to maintain neural position. Pt also participated in x 2 rounds of horseshoes in standing with emphasis on forward lean. Pt was able to demonstrate improved midline in standing  during second bout. Participated in STS x 5 with red wedge placed behind pt's heels to encourage posterior lean and for BLE strengthening. Pt required consistently minA and verbal cues for hand placement as pt consistently attempted to pull up with RW. After last STS performed ambulatory transfer to w/c minA with mod cues for sequencing during turn. Pt transported back to room and agreeable to sit in recliner. Pt ambulated x 37f to recliner with minA and scissoring gait. Pt left in recliner at end of session with seat alarm on, call bell within reach and current needs met.      Therapy Documentation Precautions:  Precautions Precautions: Fall Precaution Comments: PEG, foley, visual field loss Rt eye Restrictions Weight Bearing Restrictions: No General:   Vital Signs: Therapy Vitals Temp: (!) 97.5 F (36.4 C) Temp Source: Oral Pulse Rate: 64 Resp: 16 BP: 93/61 Oxygen Therapy SpO2: 100 %   Therapy/Group: Individual Therapy  Hy Swiatek  Aryel Edelen, PTA  09/10/2019, 4:02 PM

## 2019-09-10 NOTE — Progress Notes (Signed)
Speech Language Pathology Daily Session Note  Patient Details  Name: Arthur [name] MRN: [id number] Date of Birth: 1981/03/26  Today's Date: 09/10/2019 SLP Individual Time: 2423-5361 SLP Individual Time Calculation (min): 56 min  Short Term Goals: Week 1: SLP Short Term Goal 1 (Week 1): Pt will consume therapeutic trials of ice chips with minimal overt s/s of aspiration and min cues for use of swallowing precautions over 3 consecutive sessions prior to repeat instrumental assessment. SLP Short Term Goal 2 (Week 1): Pt will use a volitional cough or throat clear to clear his secretions and vocal wetness with mod verbal cues in 75% of opportunities. SLP Short Term Goal 3 (Week 1): Pt will increase vocal intensity and overarticulate to achieve speech intelligibility at the word level with mod assist verbal cues. SLP Short Term Goal 4 (Week 1): Pt will use external aids for recall of daily information with mod assist verbal and visual cues. SLP Short Term Goal 5 (Week 1): Pt will complete mildly complex tasks with min assist verbal cues for functional problem solving.  Skilled Therapeutic Interventions: Skilled ST services focused on swallow and cognitive skills. SLP provided assistance repositioning in bed for PO trials. SLP facilitated oral care prior to PO consumption trials of ice chips, pt demonstrated delayed wet cough on 5 out 10 ice chips and ability to re-swallow following cough in 5 out 5 opportunities with max A verbal cues. Pt demonstrated improvement of salvia management during PO trials and throughout session with mild anterior spillage, along with wet vocal quality mainly noted when pt was verbalizing. SLP also facilitated basic problem solving and recall skills in novel card task, pt required supervision A verbal cues fade mod I for problem solving and min A verbal cues for use of visual aid for recall of three rules. Pt required mod A verbal cues for speech intelligibility strategies,  over articulation and increase vocal intensity at the word level. Pt was left in room with call bell within reach and bed alarm set. ST recommends to continue skilled ST services.   As of note SLP observed pt in PT session and noted poor salvia management during ambulation. PT expressed total-max A verbal cues required to initiate swallow of secretions during session.     Pain Pain Assessment Pain Scale: 0-10 Pain Score: 0-No pain  Therapy/Group: Individual Therapy  Raechal Raben  [facility] 09/10/2019, 12:18 PM

## 2019-09-10 NOTE — Progress Notes (Signed)
Occupational Therapy Session Note  Patient Details  Name: BOLUWATIFE FLIGHT MRN: [id number] Date of Birth: 1980-12-03  Today's Date: 09/10/2019 OT Individual Time: 1345-1500 OT Individual Time Calculation (min): 75 min    Short Term Goals: Week 1:  OT Short Term Goal 1 (Week 1): Pt will don footwear with Mod A OT Short Term Goal 2 (Week 1): Pt will complete toilet transfer with Min A using LRAD OT Short Term Goal 3 (Week 1): Pt will complete 1 grooming task while standing at the sink to improve balance  Skilled Therapeutic Interventions/Progress Updates:    Patient in bed, alert and pleasant.  Supine to SSP at edge of bed with mod A.  Min A for upright posture and seated balance.  Mod A SPT bed to/from w/c.  Mod A SPT to/from toilet with max A for hygiene and to change soiled brief after incontinent episode.  Ongoing cues needed t/o session to manage oral secretions.  Washing hands at sink with set up and cues.   Completed dynavision activity for trunk control, UB coordination and visual motor challenge - trial 1 & 2 seated for 2 minutes (1st attempt = 9.23 sec, 2nd attempt = 7.06 sec)   Trial 3 & 4 in stance using bilateral UEs for 2 minutes (3rd attempt = 6.05 sec, 4th attempt = 5.71 sec)  He required min cues for all attempts for initiation and mod A to maintain stance in trial 3 & 4.    Completed unsupported sitting activities with focus on trunk control/upright posture and seated balance with cues and occ min A.  He tolerated light dowel but fatigues quickly, weight bearing and lateral lean activities with CGA.  He returned to bed at close of session with mod A.  Bed alarm set and call bell in hand.    Therapy Documentation Precautions:  Precautions Precautions: Fall Precaution Comments: PEG, foley, visual field loss Rt eye Restrictions Weight Bearing Restrictions: No General:   Vital Signs: Therapy Vitals Temp: (!) 97.5 F (36.4 C) Temp Source: Oral Pulse Rate: 64 Resp: 16 BP:  93/61 Oxygen Therapy SpO2: 100 % Pain: Pain Assessment Pain Scale: 0-10 Pain Score: 0-No pain Other Treatments:     Therapy/Group: Individual Therapy  Carlos Levering 09/10/2019, 3:55 PM

## 2019-09-10 NOTE — Progress Notes (Addendum)
Hypoglycemic Event  CBG: 69 (23:57)  Treatment: 12.5g D50 IV (9ml) (00:36)  Symptoms: asymptomatic  Follow-up CBG: Time: 01:02 CBG Result:122  Possible Reasons for Event: unkown Comments/MD notified: Progress notes    Neysa Hotter

## 2019-09-10 NOTE — Progress Notes (Signed)
[facility] PHYSICAL MEDICINE & REHABILITATION PROGRESS NOTE   Subjective/Complaints: Patient seen laying in bed this morning.  He indicates he slept well overnight, sleep chart not updated.  Discussed with nursing.  ROS: limited due to cognition/language.    Objective:   No results found. Recent Labs    09/08/19 0533 09/09/19 0648  WBC 2.2* 1.9*  HGB 11.5* 10.5*  HCT 35.1* 30.8*  PLT 207 210   Recent Labs    09/08/19 0533 09/09/19 0648  NA 134* 133*  K 4.5 4.2  CL 102 101  CO2 24 25  GLUCOSE 107* 91  BUN 14 13  CREATININE 0.88 0.78  CALCIUM 8.8* 9.0   No intake or output data in the 24 hours ending 09/10/19 0911   Physical Exam: Vital Signs Blood pressure (!) 89/56, pulse 62, temperature 97.9 F (36.6 C), temperature source Oral, resp. rate 18, SpO2 97 %. Constitutional: No distress . Vital signs reviewed.  Cachectic. HENT: Normocephalic.  Atraumatic. Eyes: EOMI. No discharge. Cardiovascular: No JVD. Respiratory: Normal effort.  No stridor. GI: Non-distended.  + PEG. Skin: Warm and dry.  Intact.  Left forearm IV site with dressing. Psych: Flat. Musc: No edema in extremities.  No tenderness in extremities. Neurological: Alert Dysarthria Motor: Grossly 4 -/5 throughout, unchanged  Assessment/Plan: 1. 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  Physiatrist is providing close team supervision and 24 hour management of active medical problems listed below.  Physiatrist and rehab team continue to assess barriers to discharge/monitor patient progress toward functional and medical goals  Care Tool:  Bathing    Body parts bathed by patient: Right arm, Left arm, Chest, Abdomen, Front perineal area, Right upper leg, Left upper leg, Face   Body parts bathed by helper: Buttocks, Right lower leg, Left lower leg     Bathing assist Assist Level: Moderate  Assistance - Patient 50 - 74%     Upper Body Dressing/Undressing Upper body dressing   What is the patient wearing?: Hospital gown only    Upper body assist Assist Level: Moderate Assistance - Patient 50 - 74%    Lower Body Dressing/Undressing Lower body dressing      What is the patient wearing?: Incontinence brief     Lower body assist Assist for lower body dressing: Moderate Assistance - Patient 50 - 74%     Toileting Toileting Toileting Activity did not occur Press photographer and hygiene only): Refused  Toileting assist Assist for toileting: Moderate Assistance - Patient 50 - 74% Assistive Device Comment: (urinal)   Transfers Chair/bed transfer  Transfers assist     Chair/bed transfer assist level: Minimal Assistance - Patient > 75%     Locomotion Ambulation   Ambulation assist      Assist level: Moderate Assistance - Patient 50 - 74% Assistive device: Walker-rolling Max distance: 10'   Walk 10 feet activity   Assist     Assist level: Moderate Assistance - Patient - 50 - 74% Assistive device: Walker-rolling   Walk 50 feet activity   Assist    Assist level: Moderate Assistance - Patient - 50 - 74% Assistive device: Walker-rolling    Walk 150 feet activity   Assist           Walk 10 feet on uneven surface  activity   Assist Walk 10 feet on uneven surfaces activity did not occur: Safety/medical concerns  Wheelchair     Assist Will patient use wheelchair at discharge?: No             Wheelchair 50 feet with 2 turns activity    Assist            Wheelchair 150 feet activity     Assist          Blood pressure (!) 89/56, pulse 62, temperature 97.9 F (36.6 C), temperature source Oral, resp. rate 18, SpO2 97 %.  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  Antithrombotics: -DVT/anticoagulation:Pharmaceutical:Lovenox -antiplatelet therapy: N/A 3. Pain Management:Tramadol or Tylenol prn for abdominal pain. 4. Mood:LCSW to follow for evaluation and support -antipsychotic agents: N/A 5. Neuropsych: This patientis not fullycapable of making decisions on hisown behalf. 6. Skin/Wound Care:routine pressure relief measures 7. Fluids/Electrolytes/Nutrition:  -osmolite plus supps thru PEG             -NPO 8. 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 10. Malnutrition/Severe dysphagia: see above. 11. Hypomagnesemia: IV supplement as needed. added supplement thru peg.   Mag 1.8 on 12/12 12.  Labile blood pressure  Soft on 12/15 13.  Hyponatremia  Sodium 133 on 12/14  Continue to monitor 14.  Leukopenia-secondary to medications  WBCs 1.9 on 12/14, labs ordered for tomorrow  Continue to monitor 15.  Acute blood loss anemia  Hemoglobin 10.5 on 12/14, labs ordered for tomorrow  Continue to monitor 16.  Hypoglycemia  Required dextrose on 12/15    LOS: 3 days A FACE TO FACE EVALUATION WAS PERFORMED  Brihanna Devenport Lorie Phenix 09/10/2019, 9:11 AM

## 2019-09-11 ENCOUNTER — Inpatient Hospital Stay (HOSPITAL_COMMUNITY): Payer: Self-pay | Admitting: Occupational Therapy

## 2019-09-11 ENCOUNTER — Inpatient Hospital Stay (HOSPITAL_COMMUNITY): Payer: Self-pay

## 2019-09-11 ENCOUNTER — Inpatient Hospital Stay (HOSPITAL_COMMUNITY): Payer: Self-pay | Admitting: Physical Therapy

## 2019-09-11 DIAGNOSIS — R7309 Other abnormal glucose: Secondary | ICD-10-CM | POA: Insufficient documentation

## 2019-09-11 LAB — CBC WITH DIFFERENTIAL/PLATELET
Abs Immature Granulocytes: 0.02 10*3/uL (ref 0.00–0.07)
Basophils Absolute: 0 10*3/uL (ref 0.0–0.1)
Basophils Relative: 1 %
Eosinophils Absolute: 0 10*3/uL (ref 0.0–0.5)
Eosinophils Relative: 0 %
HCT: 33 % — ABNORMAL LOW (ref 39.0–52.0)
Hemoglobin: 11 g/dL — ABNORMAL LOW (ref 13.0–17.0)
Immature Granulocytes: 1 %
Lymphocytes Relative: 44 %
Lymphs Abs: 0.9 10*3/uL (ref 0.7–4.0)
MCH: 31.3 pg (ref 26.0–34.0)
MCHC: 33.3 g/dL (ref 30.0–36.0)
MCV: 94 fL (ref 80.0–100.0)
Monocytes Absolute: 0.3 10*3/uL (ref 0.1–1.0)
Monocytes Relative: 16 %
Neutro Abs: 0.8 10*3/uL — ABNORMAL LOW (ref 1.7–7.7)
Neutrophils Relative %: 38 %
Platelets: 215 10*3/uL (ref 150–400)
RBC: 3.51 MIL/uL — ABNORMAL LOW (ref 4.22–5.81)
RDW: 17.4 % — ABNORMAL HIGH (ref 11.5–15.5)
WBC: 2.1 10*3/uL — ABNORMAL LOW (ref 4.0–10.5)
nRBC: 0 % (ref 0.0–0.2)

## 2019-09-11 LAB — GLUCOSE, CAPILLARY
Glucose-Capillary: 119 mg/dL — ABNORMAL HIGH (ref 70–99)
Glucose-Capillary: 133 mg/dL — ABNORMAL HIGH (ref 70–99)
Glucose-Capillary: 136 mg/dL — ABNORMAL HIGH (ref 70–99)
Glucose-Capillary: 148 mg/dL — ABNORMAL HIGH (ref 70–99)
Glucose-Capillary: 65 mg/dL — ABNORMAL LOW (ref 70–99)
Glucose-Capillary: 72 mg/dL (ref 70–99)
Glucose-Capillary: 81 mg/dL (ref 70–99)
Glucose-Capillary: 83 mg/dL (ref 70–99)

## 2019-09-11 MED ORDER — POLYETHYLENE GLYCOL 3350 17 G PO PACK
17.0000 g | PACK | Freq: Every day | ORAL | Status: DC
  Administered 2019-09-11 – 2019-09-15 (×3): 17 g via ORAL
  Filled 2019-09-11 (×6): qty 1

## 2019-09-11 MED ORDER — SENNA 8.6 MG PO TABS
2.0000 | ORAL_TABLET | Freq: Every day | ORAL | Status: DC
  Administered 2019-09-11 – 2019-09-16 (×6): 17.2 mg via ORAL
  Filled 2019-09-11 (×6): qty 2

## 2019-09-11 MED ORDER — MIDODRINE HCL 5 MG PO TABS
5.0000 mg | ORAL_TABLET | Freq: Three times a day (TID) | ORAL | Status: DC
  Administered 2019-09-11 – 2019-09-17 (×18): 5 mg
  Filled 2019-09-11 (×18): qty 1

## 2019-09-11 MED ORDER — OSMOLITE 1.5 CAL PO LIQD
200.0000 mL | Freq: Every day | ORAL | Status: DC
  Administered 2019-09-11 – 2019-09-12 (×3): 200 mL
  Filled 2019-09-11: qty 237

## 2019-09-11 NOTE — Progress Notes (Signed)
[facility] PHYSICAL MEDICINE & REHABILITATION PROGRESS NOTE   Subjective/Complaints: Patient seen laying in bed this AM.  He states she slept well overnight, confirmed with sleep chart.  He states he wants to drink.    ROS: Denies CP, SOB, N/V/D  Objective:   No results found. Recent Labs    09/09/19 0648 09/11/19 0459  WBC 1.9* 2.1*  HGB 10.5* 11.0*  HCT 30.8* 33.0*  PLT 210 215   Recent Labs    09/09/19 0648  NA 133*  K 4.2  CL 101  CO2 25  GLUCOSE 91  BUN 13  CREATININE 0.78  CALCIUM 9.0    Intake/Output Summary (Last 24 hours) at 09/11/2019 1031 Last data filed at 09/11/2019 0706 Gross per 24 hour  Intake --  Output 350 ml  Net -350 ml     Physical Exam: Vital Signs Blood pressure 93/62, pulse 62, temperature 97.9 F (36.6 C), temperature source Oral, resp. rate 18, SpO2 100 %. Constitutional: No distress . Vital signs reviewed. Cachectic. HENT: Normocephalic.  Atraumatic. Eyes: EOMI. No discharge. Cardiovascular: No JVD. Respiratory: Normal effort.  No stridor. GI: Non-distended. +PEG. Skin: Warm and dry.  Intact. Psych: Flat. Musc: No edema in extremities.  No tenderness in extremities. Neurological: Alert Dysarthria Motor: Grossly 4 -/5 throughout, improving  Assessment/Plan: 1. 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  Physiatrist is providing close team supervision and 24 hour management of active medical problems listed below.  Physiatrist and rehab team continue to assess barriers to discharge/monitor patient progress toward functional and medical goals  Care Tool:  Bathing    Body parts bathed by patient: Right arm, Left arm, Chest, Abdomen, Front perineal area, Right upper leg, Left upper leg, Face   Body parts bathed by helper: Buttocks, Right lower leg, Left lower leg     Bathing assist Assist Level: Moderate  Assistance - Patient 50 - 74%     Upper Body Dressing/Undressing Upper body dressing   What is the patient wearing?: Hospital gown only    Upper body assist Assist Level: Moderate Assistance - Patient 50 - 74%    Lower Body Dressing/Undressing Lower body dressing      What is the patient wearing?: Incontinence brief     Lower body assist Assist for lower body dressing: Moderate Assistance - Patient 50 - 74%     Toileting Toileting Toileting Activity did not occur Press photographer and hygiene only): Refused  Toileting assist Assist for toileting: Moderate Assistance - Patient 50 - 74% Assistive Device Comment: urinal   Transfers Chair/bed transfer  Transfers assist     Chair/bed transfer assist level: Minimal Assistance - Patient > 75%     Locomotion Ambulation   Ambulation assist      Assist level: Moderate Assistance - Patient 50 - 74% Assistive device: Walker-rolling Max distance: 10'   Walk 10 feet activity   Assist     Assist level: Moderate Assistance - Patient - 50 - 74% Assistive device: Walker-rolling   Walk 50 feet activity   Assist    Assist level: Moderate Assistance - Patient - 50 - 74% Assistive device: Walker-rolling    Walk 150 feet activity   Assist Walk 150 feet activity did not occur: Safety/medical concerns  Assist level: (per report)      Walk 10 feet on uneven surface  activity   Assist Walk 10 feet on uneven surfaces activity  did not occur: Safety/medical concerns         Wheelchair     Assist Will patient use wheelchair at discharge?: No             Wheelchair 50 feet with 2 turns activity    Assist            Wheelchair 150 feet activity     Assist          Blood pressure 93/62, pulse 62, temperature 97.9 F (36.6 C), temperature source Oral, resp. rate 18, SpO2 100 %.  Medical Problem List and Plan: 1.Functional and cognitive deficitssecondary to CNS  toxoplasmosis  Continue CIR  Team conference today to discuss current and goals and coordination of care, home and environmental barriers, and discharge planning with nursing, case manager, and therapies.  2. Antithrombotics: -DVT/anticoagulation:Pharmaceutical:Lovenox -antiplatelet therapy: N/A 3. Pain Management:Tramadol or Tylenol prn for abdominal pain. 4. Mood:LCSW to follow for evaluation and support -antipsychotic agents: N/A 5. Neuropsych: This patientis not fullycapable of making decisions on hisown behalf. 6. Skin/Wound Care:routine pressure relief measures 7. Fluids/Electrolytes/Nutrition: -osmolite plus supps thru PEG             -NPO 8. 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 10. Malnutrition/Severe dysphagia: see above. 11. Hypomagnesemia: IV supplement as needed. added supplement thru peg.   Mag 1.8 on 12/12, plan to order labs later this week 12.  Labile blood pressure  Soft on 12/16  Midodrine increased to 5 TID on 09/11/2019, communicated with pharmacy 13.  Hyponatremia  Sodium 133 on 12/14  Continue to monitor 14.  Leukopenia-secondary to medications  WBCs 2.1 on 09/11/2019  Continue to monitor 15.  Acute blood loss anemia  Hemoglobin 11.0 on 12/16  Continue to monitor 16.  Labile blood glucose  Labile on 12/16   LOS: 4 days A FACE TO FACE EVALUATION WAS PERFORMED  Jariana Shumard Lorie Phenix 09/11/2019, 10:31 AM

## 2019-09-11 NOTE — Patient Care Conference (Signed)
Inpatient RehabilitationTeam Conference and Plan of Care Update Date: 09/11/2019   Time: 11:45 AM   Patient Name: Arthur [name]      Medical Record Number: [id number]  Date of Birth: 07-31-1981 Sex: Male         Room/Bed: 4W54O/2V03J-00 Payor Info: Payor: /    Admit Date/Time:  09/07/2019  3:20 PM  Primary Diagnosis:  CNS toxoplasmosis [facility])  Patient Active Problem List   Diagnosis Date Noted  . Labile blood glucose   . Hypomagnesemia   . Acute blood loss anemia   . Leucopenia   . Labile blood pressure   . HIV disease ([place])   . Debility 09/07/2019  . Aspiration into airway   . Palliative care by specialist   . DNR (do not resuscitate) discussion   . Weakness generalized   . Dysphagia   . Protein-calorie malnutrition, severe 08/19/2019  . 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 Goals of care, counseling/discussion   . Bacterial meningitis   . Thrush of mouth and esophagus ([place])   . Palliative care encounter   . HIV (human immunodeficiency virus infection) ([place]) 07/29/2019  . Vision loss 07/29/2019  . Abnormal brain MRI 07/29/2019  . Acquired immunodeficiency syndrome ([place]) 07/28/2019  . Bradycardia 07/28/2019  . Hyponatremia 07/28/2019  . Nausea and vomiting 03/25/2013  . Muscle spasm 03/25/2013  . Drug use 01/11/2012  . 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 COUGH 06/23/2009  . CHEST PAIN, PLEURITIC 06/23/2009  . FUNGAL DERMATITIS 05/28/2008  . GERD 05/28/2008  . DENTAL CARIES 02/13/2008  . NICOTINE ADDICTION 11/14/2007  . BACK STRAIN, LUMBAR 08/22/2007  . 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 WEIGHT LOSS, RECENT 05/01/2007  . ANXIETY DEPRESSION 02/14/2007  . HIV DISEASE 02/13/2007  . CERVICAL LYMPHADENOPATHY, ANTERIOR, RIGHT 02/13/2007    Expected Discharge Date: Expected Discharge Date: 09/27/19  Team Members Present: Physician leading conference: Dr. Delice Lesch Social Worker Present: Ovidio Kin, LCSW Nurse Present: Suella Grove, RN Case Manager: Karene Fry,  RN PT Present: Becky Sax, PT OT Present: Laverle Hobby, OT SLP Present: Jettie Booze, CF-SLP PPS Coordinator present : Gunnar Fusi, SLP     Current Status/Progress Goal Weekly Team Focus  Bowel/Bladder   incontinent of B&B. LBM 12/13  attempt to train bladder/bowel  assess q shift/prn   Swallow/Nutrition/ Hydration   Max A, salvia management and thin/ice chip trials  MIn A  management of salvia, ice chip trials and repeat MBS   ADL's   Mod A stand pivot and ambulatory transfers using RW, Mod A UB/LB dressing, Mod A bathing w/c level at sink  Supervision-steady assist overall  Sitting/standing balance, cognitive remediation, ADL retraining, Lt NMR   Mobility   MinA bed mobiltiy, modA stand pivot transfer, modA gait with RW due to ataxia and sciccoring, strong posterior lean with standing activities  supervision  standing balance, NMR, transfers, gait, d/c planning   Communication   Max A, word level for vocal intensity and over articulation  Min A  speech intelligibility strategies and salvia management   Safety/Cognition/ Behavioral Observations  Mod A  Min-Supervision A  basic to semi-complex problem solving, recall strategies and error awareness   Pain   no complaints of pain  remain free of pain  assess q shift/prn and medicate as needed   Skin   stage II pressure injury to sacrum covered with foam  prevent further breakdown and remain free of infection  assess q shift/prn and change dressing as needed      *See Care  Plan and progress notes for long and short-term goals.     Barriers to Discharge  Current Status/Progress Possible Resolutions Date Resolved   Nursing                  PT  Inaccessible home environment  6 STE              OT Home environment access/layout;Weight;Medical stability;Other (comments)  PEG             SLP                SW Medication compliance;Other (comments) Non-compliant with medications and uninsured-was not seeing MD prior to admission             Discharge Planning/Teaching Needs:  Home with sister and Mom and friend-sitter can assist. Between all of them can provide 24 hr supervision level.      Team Discussion: Monitoring labs, CBG up/down, TF, adjusted BP meds.  RN on TF, not tolerating well, vomited, inc bowel, stage 2 on sacrum, no pain.  OT mod A transfers, mod A LB bathing and dressing, goals S/CGA.  PT bed min A, mod A transfers and gait, posterior lean, goals S.  SLP min A swallow, speech S goals, cognition currently max A, ?clearance to do RMT training.  Will go home with help of sister, mom and friend.   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 Weekly Focus/Goal: Improve mobility, endurance, electrolytes, WBCs, BP, swallowing  Barriers to Discharge: Medical stability;Nutrition means;Medication compliance;Wound care   Possible Resolutions to Barriers: Therapies, follow labs, optimize BP meds, advance diet as tolerated   Continued Need for Acute Rehabilitation Level of Care: The patient requires daily medical management by a physician with specialized training in physical medicine and rehabilitation for the following reasons: Direction of a multidisciplinary physical rehabilitation program to maximize functional independence : Yes Medical management of patient stability for increased activity during participation in an intensive rehabilitation regime.: Yes Analysis of laboratory values and/or radiology reports with any subsequent need for medication adjustment and/or medical intervention. : Yes   I attest that I was present, lead the team conference, and concur with the assessment and plan of the team.   Trish Mage 09/11/2019, 8:38 PM  Team conference was held via web/ teleconference due to COVID - 19

## 2019-09-11 NOTE — Progress Notes (Signed)
Physical Therapy Session Note  Patient Details  Name: Arthur [name] MRN: [id number] Date of Birth: 1980-12-24  Today's Date: 09/11/2019 PT Individual Time: 0900-1000 PT Individual Time Calculation (min): 60 min   Short Term Goals: Week 1:  PT Short Term Goal 1 (Week 1): Pt will ambulate 100 ft with LRAD and min assist PT Short Term Goal 2 (Week 1): Pt will ascend/descend 8 steps with rails and min assist PT Short Term Goal 3 (Week 1): Pt will perform sit<>stand with CGA  Skilled Therapeutic Interventions/Progress Updates: Pt presented in bed agreeable to therapy. Pt denies pain during session. Performed supine to sit with minA and use of bed features. Pt donned shirt and shoes with modA and minA to maintain neutral sitting balance. Performed STS with minA and pt pulled pants over hips min/modA. Pt then performed stand pivot transfer to w/c with minA. Pt propelled approx 171f with use fo 4 extremities for general conditioning. Pt transported remaining distance to ortho gym. PTA placed 1lb wts on pt and participated in gait training 540fx 2 with RW and modA due to ataxia, scissoring, and poor righting reactions. Pt was able to follow verbal cues to step towards RW wheel however unable to perform consistently. Pt then transferred to mat via squat pivot CGA and participated in STS with manual facilitation at hips to promote anterior pelvic tilt and minimize "sitting on the Y". Pt was able to correct with tactile cues but was difficulty for pt to maintain. Pt participated in standing balance reaching and placing cards on board with pt requiring x2 corrections for correct placement. Returned to w/c via squat pivot CGA with verbal cues. Pt transported to nsg station and ambulated approx 7526fith RW and wts at ankles with min/modA due to increased scissoring noted with fatigue. Pt returned to bed at end of session and was able to transfer to supine with bed flat with supervision. Pt left in bed at end of  session and left with call bell within reach, bed alarm on, and needs met.      Therapy Documentation Precautions:  Precautions Precautions: Fall Precaution Comments: PEG, foley, visual field loss Rt eye Restrictions Weight Bearing Restrictions: No General:   Vital Signs:   Pain: Pain Assessment Pain Scale: 0-10 Pain Score: 0-No pain    Therapy/Group: Individual Therapy  Sweet Jarvis  Diamond Jentz, PTA  09/11/2019, 12:44 PM

## 2019-09-11 NOTE — Progress Notes (Signed)
Patient told family member that he is vomiting the tube feed because of the milk taste and family member also confirm that patient doesn't drink milk because he hate the milk taste and smell.

## 2019-09-11 NOTE — Progress Notes (Signed)
Speech Language Pathology Daily Session Note  Patient Details  Name: Arthur [name] MRN: [id number] Date of Birth: 1981-02-13  Today's Date: 09/11/2019 SLP Individual Time: 8127-5170 SLP Individual Time Calculation (min): 57 min  Short Term Goals: Week 1: SLP Short Term Goal 1 (Week 1): Pt will consume therapeutic trials of ice chips with minimal overt s/s of aspiration and min cues for use of swallowing precautions over 3 consecutive sessions prior to repeat instrumental assessment. SLP Short Term Goal 2 (Week 1): Pt will use a volitional cough or throat clear to clear his secretions and vocal wetness with mod verbal cues in 75% of opportunities. SLP Short Term Goal 3 (Week 1): Pt will increase vocal intensity and overarticulate to achieve speech intelligibility at the word level with mod assist verbal cues. SLP Short Term Goal 4 (Week 1): Pt will use external aids for recall of daily information with mod assist verbal and visual cues. SLP Short Term Goal 5 (Week 1): Pt will complete mildly complex tasks with min assist verbal cues for functional problem solving.  Skilled Therapeutic Interventions: Skilled ST services focused on swallow and speech skills. Pt completed oral care piror to PO consumption of ice chip trials, pt produced delayed wet cough/throat clear on 2 out 10 trials with mild anterior spillage of secretions. Pt demonstrated ability to initiate re-swallow following s/s aspiration and ability to initiate swallow, when voice appeared wet, given verbal cues. Pt demonstrate anterior spillage, reduced control of salvia management in 2-3 minute intervals when verbalizing and increase spillage during ambulation without cuing to swallow. SLP facilitated speech intelligibility repeating at the word level 80% intelligibility, word level in picture description task 70% intelligibility and phrase level 50% intelligibility. Pt requested to use bathroom, Nurse and SLP assisted via [place], pt void BM  and was transferred back into bed. Pt demonstrated recall of yesterday's ST session events with mod A verbal cues. Pt was left in room with call bell within reach and bed alarm set. ST recommends to continue skilled ST services.     Pain Pain Assessment Pain Score: 0-No pain  Therapy/Group: Individual Therapy  Akiera Allbaugh  [facility] 09/11/2019, 3:27 PM

## 2019-09-11 NOTE — Progress Notes (Signed)
Occupational Therapy Session Note  Patient Details  Name: Arthur [name] MRN: [id number] Date of Birth: Mar 01, 1981  Today's Date: 09/11/2019 OT Individual Time: 1115-1200 OT Individual Time Calculation (min): 45 min    Short Term Goals: Week 1:  OT Short Term Goal 1 (Week 1): Pt will don footwear with Mod A OT Short Term Goal 2 (Week 1): Pt will complete toilet transfer with Min A using LRAD OT Short Term Goal 3 (Week 1): Pt will complete 1 grooming task while standing at the sink to improve balance  Skilled Therapeutic Interventions/Progress Updates:    1:1 Pt had just finished receiving his tube feed and as this therapist and pt was discussing plans for session; pt vomited out all of his tube feed. Assisted pt into fully upright sitting position in the bed with min A and provide comfort. Pt transitioned to EOb with min A. Total A was required to doff all clothing. Pt had become incontinent of urine and some stool during vomiting. Transferred stand pivot into the w/c and then into shower. Pt able to maintain sitting balance on the wide BSC with min guard to min A during bathing. Pt able to bathe Ues and thighs but required A for thoroughness. Max A needed for bathing feet buttocks and trunk. Mod cues provided to encourage upright trunk and head position. Mod A stand pivot transfer back into w/c. Pt choose to don a gown and get back into bed. Total A for brief and max for gown. In standing, pt required mod A at the hips and trunk to maintain balance at midline. Pt with strong posterior trunk lean. Pt with difficulty opening deodorant   containers and A needed for end range to Reach arm pit.   Therapy Documentation Precautions:  Precautions Precautions: Fall Precaution Comments: PEG, foley, visual field loss Rt eye Restrictions Weight Bearing Restrictions: No Pain: Pain Assessment Pain Score: 0-No pain   Therapy/Group: Individual Therapy  Willeen Cass [facility] 09/11/2019, 5:48 PM

## 2019-09-12 ENCOUNTER — Inpatient Hospital Stay (HOSPITAL_COMMUNITY): Payer: Self-pay | Admitting: Physical Therapy

## 2019-09-12 ENCOUNTER — Inpatient Hospital Stay (HOSPITAL_COMMUNITY): Payer: Self-pay | Admitting: Occupational Therapy

## 2019-09-12 ENCOUNTER — Inpatient Hospital Stay (HOSPITAL_COMMUNITY): Payer: Self-pay | Admitting: Speech Pathology

## 2019-09-12 DIAGNOSIS — R5381 Other malaise: Principal | ICD-10-CM

## 2019-09-12 DIAGNOSIS — K5901 Slow transit constipation: Secondary | ICD-10-CM

## 2019-09-12 DIAGNOSIS — I9589 Other hypotension: Secondary | ICD-10-CM

## 2019-09-12 DIAGNOSIS — R1310 Dysphagia, unspecified: Secondary | ICD-10-CM

## 2019-09-12 DIAGNOSIS — R7309 Other abnormal glucose: Secondary | ICD-10-CM

## 2019-09-12 DIAGNOSIS — I959 Hypotension, unspecified: Secondary | ICD-10-CM

## 2019-09-12 LAB — GLUCOSE, CAPILLARY
Glucose-Capillary: 84 mg/dL (ref 70–99)
Glucose-Capillary: 71 mg/dL (ref 70–99)
Glucose-Capillary: 107 mg/dL — ABNORMAL HIGH (ref 70–99)
Glucose-Capillary: 104 mg/dL — ABNORMAL HIGH (ref 70–99)
Glucose-Capillary: 103 mg/dL — ABNORMAL HIGH (ref 70–99)

## 2019-09-12 LAB — ACID FAST CULTURE WITH REFLEXED SENSITIVITIES (MYCOBACTERIA): Acid Fast Culture: NEGATIVE

## 2019-09-12 MED ORDER — OSMOLITE 1.5 CAL PO LIQD: 237.0000 mL | Freq: Every day | ORAL | Status: DC

## 2019-09-12 MED ORDER — VITAL 1.5 CAL PO LIQD
1000.0000 mL | ORAL | Status: DC
  Administered 2019-09-12: 1000 mL
  Filled 2019-09-12 (×3): qty 1000

## 2019-09-12 NOTE — Plan of Care (Signed)
  Problem: Consults Goal: RH GENERAL PATIENT EDUCATION Description: See Patient Education module for education specifics. Outcome: Progressing   Problem: RH BOWEL ELIMINATION Goal: RH STG MANAGE BOWEL WITH ASSISTANCE Description: STG Manage Bowel with min Assistance. Outcome: Progressing   Problem: RH BLADDER ELIMINATION Goal: RH STG MANAGE BLADDER WITH ASSISTANCE Description: STG Manage Bladder With min Assistance Outcome: Progressing   Problem: RH SKIN INTEGRITY Goal: RH STG SKIN FREE OF INFECTION/BREAKDOWN Description: No new skin breakdown Outcome: Progressing Goal: RH STG MAINTAIN SKIN INTEGRITY WITH ASSISTANCE Description: STG Maintain Skin Integrity With min Assistance. Outcome: Progressing Goal: RH STG ABLE TO PERFORM INCISION/WOUND CARE W/ASSISTANCE Description: STG Able To Perform Incision/Wound Care With min Assistance. Outcome: Progressing   Problem: RH SAFETY Goal: RH STG ADHERE TO SAFETY PRECAUTIONS W/ASSISTANCE/DEVICE Description: STG Adhere to Safety Precautions With min Assistance/Device. Outcome: Progressing   Problem: RH KNOWLEDGE DEFICIT GENERAL Goal: RH STG INCREASE KNOWLEDGE OF SELF CARE AFTER HOSPITALIZATION Outcome: Progressing   

## 2019-09-12 NOTE — Progress Notes (Signed)
Nutrition Follow-up  DOCUMENTATION CODES:   Underweight, Severe malnutrition in context of chronic illness  INTERVENTION:   Transition back to continuous tube feeding via PEG: - Vital 1.5 @ 80 ml/hr (tube feeding can be held for up to 4 hours for therapies) - Free water per MD/PA, currently 100 ml q 8 hours  Tube feeding regimen and current free water provides 2400 kcal, 108 grams of protein, and 1522 ml of H2O.   - Continue liquid MVI daily per tube  NUTRITION DIAGNOSIS:   Severe Malnutrition related to chronic illness (HIV, dysphagia) as evidenced by severe fat depletion, severe muscle depletion, percent weight loss (19.2% weight loss in 3 months).  New diagnosis after completion of NFPE  GOAL:   Patient will meet greater than or equal to 90% of their needs  Met via TF  MONITOR:   Labs, Weight trends, TF tolerance, Skin, I & O's  REASON FOR ASSESSMENT:   Consult Enteral/tube feeding initiation and management  ASSESSMENT:   38 year old male with PMH of HIV, substance abuse, depression, GERD. Pt presented to ED on 07/28/19 with dizziness, weakness, and intermittent headaches. Pt had been noncompliant with his HIV drugs due to lack of insurance/affordibility issues. MRI brain done revealing widespread areas of diffusion abnormal contrast in both cerebral hemispheres predominantly left anterior frontal matter concerning for opportunistic infection as well as premature generalized atrophy. 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 Palliative care consulted to discuss [place] and mother elected on aggressive treatment.  He has been maintained on tube feeds with NPO due to severe dysphagia with difficulty handing oral secretions. PEG placed on 12/07. Pt admitted to CIR on 09/07/19.  RD was made aware that pt has not been tolerating his bolus tube feeds.  Discussed with RN and PA. Plan is to transition back to continuous tube feeds using an elemental formula to promote toleration.  Discussed issues with tube feeds with pt at bedside. Pt reporting that he "tastes" the tube feeding when he burps and that this makes him nauseous. Pt and RN report pt has vomited bolus tube feeds a few times.  Reviewed weights in chart. CIR admit weight obtained this AM: 51.7 kg. Pt with a weight loss of 12.3 kg since 04/15/19. This is a 19.2% weight loss in 3 months which is significant for timeframe.  Medications reviewed and include: Pepcid, magnesium oxide, liquid MVI, Miralax, senna  Labs reviewed: sodium 133 CBG's: 65-148 x 24 hours  NUTRITION - FOCUSED PHYSICAL EXAM:    Most Recent Value  Orbital Region  Severe depletion  Upper Arm Region  Severe depletion  Thoracic and Lumbar Region  Severe depletion  Buccal Region  Severe depletion  Temple Region  Severe depletion  Clavicle Bone Region  Severe depletion  Clavicle and Acromion Bone Region  Severe depletion  Scapular Bone Region  Severe depletion  Dorsal Hand  Moderate depletion  Patellar Region  Severe depletion  Anterior Thigh Region  Severe depletion  Posterior Calf Region  Severe depletion  Edema (RD Assessment)  None  Hair  Reviewed  Eyes  Reviewed  Mouth  Reviewed  Skin  Reviewed  Nails  Reviewed       Diet Order:   Diet Order            Diet NPO time specified  Diet effective midnight              EDUCATION  NEEDS:   No education needs have been identified at this time  Skin:  Skin Assessment: Skin Integrity Issues: Stage II: sacrum  Last BM:  09/12/19 medium type 5  Height:   Ht Readings from Last 1 Encounters:  07/29/19 5' 11"  (1.803 m)    Weight:   Wt Readings from Last 1 Encounters:  09/12/19 51.7 kg    Ideal Body Weight:  78.2 kg  BMI:  Body mass index is 15.9 kg/m.  Estimated Nutritional Needs:   Kcal:  2200-2400  Protein:  105-120  grams  Fluid:  >/= 1.8 L    Gaynell Face, MS, RD, LDN Inpatient Clinical Dietitian Pager: [phone] Weekend/After Hours: [phone]

## 2019-09-12 NOTE — Progress Notes (Signed)
Speech Language Pathology Daily Session Note  Patient Details  Name: WESSON STITH MRN: [id number] Date of Birth: 04-02-1981  Today's Date: 09/12/2019 SLP Individual Time: 7408-1448 SLP Individual Time Calculation (min): 58 min  Short Term Goals: Week 1: SLP Short Term Goal 1 (Week 1): Pt will consume therapeutic trials of ice chips with minimal overt s/s of aspiration and min cues for use of swallowing precautions over 3 consecutive sessions prior to repeat instrumental assessment. SLP Short Term Goal 2 (Week 1): Pt will use a volitional cough or throat clear to clear his secretions and vocal wetness with mod verbal cues in 75% of opportunities. SLP Short Term Goal 3 (Week 1): Pt will increase vocal intensity and overarticulate to achieve speech intelligibility at the word level with mod assist verbal cues. SLP Short Term Goal 4 (Week 1): Pt will use external aids for recall of daily information with mod assist verbal and visual cues. SLP Short Term Goal 5 (Week 1): Pt will complete mildly complex tasks with min assist verbal cues for functional problem solving.  Skilled Therapeutic Interventions:  Pt able to reposition himself upright and hold his head upright for trials of ice chips. Pt with salvia visibly present in mouth and pt required verbal cues to produce swallow and clear secretions. Pt able to produce swift swallow in response to cues and demonstrated hyolaryngeal movement to palpation. After oral care, pt consumed ice chips with continued presence of swift swallow response. Throughout session pt was audibly congested, had wet vocal quality and coughed frequently, making it difficult to differentiate aspiration of ice chips. Recommend repeat instrumental assessment of his swallowing for assessment of airway protection and to assess for any possibility of returning to PO intake. Education provided to pt regarding recommended plan, pt in agreement. SLP also facilitated session by  providing written words for pt to read aloud to practice use of speech intelligibility strategies. During blind task, pt's speech was < 25% intelligible to this listener. With Mod verbal cues to make big speech movements, pt able to improve speech intelligibility at 50% at the word level. Pt became emotional at the end of the session, therapist provided a listening ear and support.      Pain    Therapy/Group: Individual Therapy  Katlin Ciszewski 09/12/2019, 2:47 PM

## 2019-09-12 NOTE — Progress Notes (Signed)
Physical Therapy Session Note  Patient Details  Name: Arthur [name] MRN: [id number] Date of Birth: 20-May-1981  Today's Date: 09/12/2019 PT Individual Time: 0805-0905 AND 1300-1330 PT Individual Time Calculation (min): 60 min and 30 min   Short Term Goals: Week 1:  PT Short Term Goal 1 (Week 1): Pt will ambulate 100 ft with LRAD and min assist PT Short Term Goal 2 (Week 1): Pt will ascend/descend 8 steps with rails and min assist PT Short Term Goal 3 (Week 1): Pt will perform sit<>stand with CGA  Skilled Therapeutic Interventions/Progress Updates:  Session 1  Pt received supine in bed and agreeable to PT. Supine>sit transfer with CGA assist and cues for posture to improve anterior LOB. Squat pivot transfer to [facility] with min   Oral care at sink with toothbrush and oral rinse. Moderate-max cues for safety and small amount of water. Also instructed to improve oral control and prevent swallowing as well as encouragement to increase frequency of spitting to prevent chance of reflexive swallowing. PT instructed upper and lower body dressing with mod assist to prevent lateral LOB while management pants.   WC mobility through hall of rehab unit iwht BLE and BUE for overall conditioning and improved coordination of all 4 extermities.   Stand pivot transfer to bed with min assist and RW. Sit<>supine with CGA for safety and cues for imrpoved UE placement to prevnet lateral LOB Supine NMR: PNF D1 and D2 for BLE x 30 with manual resistance as tolerated with occasional guidance into abduction. Hip abduction x 12 BLE. Bridges with sustained hip abduction x 10 .   Sit<>stand from EOB with min assist. Standing balance training with RW to perform forward/laterla reach to place horse shoe on basketball goal x 5 BUE. Mod assist overall to improve anterior weight shift, maintain wide BOS, and improve awareness of LOB.   Gait training 2 x 63f with min-mod assist overall for safety due to scissoring. On second  bout, External visual cue to keep feet on either side of line in tile; noted to have significant improvement in step width with visual cues for step width.    Pt returned to room and performed stand pivot  transfer to bed with min assist and UE support on bed rail. Sit>supine completed with supervision assist.  and left supine in bed with call bell in reach and all needs met.      Session 2.  Pt received supine in bed and agreeable to PT. Supine>sit transfer with CGA assist and min cues for UE placement to improve success of transfer. [facility]  Stand pivot transfer to and from Nustep with RW and min assist for safety. With min cues for decreased impulsivity and gait pattern. nustep reciprocal movement training x 6 min with moderate cues for symmetry of movement improved posture in seat.   Pt then instructed in Gait training x 834fwith mod assist for safety due to increasing scissoring gait as pt fatigue and RW management to prevent hitting wall on the L. Pt returned to room and performed ambulatory transfer to bed with RW and Mod assist. Sit>supine completed with supervision assist, and left supine in bed with call bell in reach and all needs met.        Therapy Documentation Precautions:  Precautions Precautions: Fall Precaution Comments: PEG, foley, visual field loss Rt eye Restrictions Weight Bearing Restrictions: No Pain:   denies  throughout therapy.     Therapy/Group: Individual Therapy  Lorie Phenix 09/12/2019, 9:33 AM

## 2019-09-12 NOTE — Progress Notes (Signed)
Occupational Therapy Session Note  Patient Details  Name: Arthur [name] MRN: [id number] Date of Birth: [birth year]  Today's Date: 09/12/2019 OT Individual Time: 1445-1533 OT Individual Time Calculation (min): 48 min    Short Term Goals: Week 1:  OT Short Term Goal 1 (Week 1): Pt will don footwear with Mod A OT Short Term Goal 2 (Week 1): Pt will complete toilet transfer with Min A using LRAD OT Short Term Goal 3 (Week 1): Pt will complete 1 grooming task while standing at the sink to improve balance  Skilled Therapeutic Interventions/Progress Updates:    Patient in bed, nursing had assisted him onto bed pan, max A to remove bed pan, complete hygiene and assist with changing brief, patient able to pull pants over hips with min A.  Supine to SSP with min A.  SPT bed to/from w/c with min/mod A due to poor trunk control.  Noted improved vocal volume and control of oral secretions today.  Completed seated trunk control activities at edge of mat with min A and cues for upright posture.  Completed standing activity - he is able to use bilateral hands to construct pipe design copy task with increased time, min A to maintain standing balance with cues for abdominal control and posture.  Completed reach and place task to challenge trunk control/balance and use of left UE with min A.  He is pleasant and cooperative t/o session - states that he enjoys the challenges.  Returned to bed at close of session, bed > 30 degrees as tube feed is running, bed alarm set and call bell in reach.    Therapy Documentation Precautions:  Precautions Precautions: Fall Precaution Comments: PEG, foley, visual field loss Rt eye Restrictions Weight Bearing Restrictions: No General:   Vital Signs: Therapy Vitals Temp: 97.6 F (36.4 C) Temp Source: Oral Pulse Rate: (!) 58 Resp: 18 BP: 92/64 Patient Position (if appropriate): Lying Oxygen Therapy SpO2: 99 % O2 Device: Room Air Pain: Pain Assessment Pain Scale:  0-10 Pain Score: 0-No pain   Other Treatments:     Therapy/Group: Individual Therapy  Carlos Levering 09/12/2019, 4:40 PM

## 2019-09-12 NOTE — Progress Notes (Signed)
Social Work Patient ID: Arthur [name], male   DOB: 1981-07-07, 39 y.o.   MRN: [id number]  Met with pt and spoke with Mom via telephone to discuss team conference goals of supervision-CGA level and target discharge date 09/27/2019. She voiced she will let his sister know and they will work toward this plan. Aware will have another repeat MBS when Speech feels it is appropriate. Pt is verbalizing he wants to eat now. Will ask neruo-psych to see when appropriate. Work on discharge needs.

## 2019-09-12 NOTE — Progress Notes (Signed)
[place] PHYSICAL MEDICINE & REHABILITATION PROGRESS NOTE   Subjective/Complaints: Patient seen sitting up in bed this AM.  He states he slept well overnight, confirmed with sleep chart.  He engages more this AM and asks when he doesn't have to be "NPO".  Discussed his frustrations with diet with PA as well.  ROS: Denies CP, SOB, N/V/D  Objective:   No results found. Recent Labs    09/11/19 0459  WBC 2.1*  HGB 11.0*  HCT 33.0*  PLT 215   No results for input(s): NA, K, CL, CO2, GLUCOSE, BUN, CREATININE, CALCIUM in the last 72 hours.  Intake/Output Summary (Last 24 hours) at 09/12/2019 1004 Last data filed at 09/11/2019 2219 Gross per 24 hour  Intake 1155 ml  Output --  Net 1155 ml     Physical Exam: Vital Signs Blood pressure (!) 94/58, pulse (!) 55, temperature 97.9 F (36.6 C), temperature source Oral, resp. rate 16, weight 51.7 kg, SpO2 95 %. Constitutional: No distress . Vital signs reviewed. Cachectic.  HENT: Normocephalic.  Atraumatic. Eyes: EOMI. No discharge. Cardiovascular: No JVD. Respiratory: Normal effort.  No stridor. GI: Non-distended. +PEG. Skin: Warm and dry.  Intact. Psych: Flat.  Musc: No edema in extremities.  No tenderness in extremities. Neurological: Alert Dysarthria Motor: Grossly 4/5 throughout  Assessment/Plan: 1. 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  Physiatrist is providing close team supervision and 24 hour management of active medical problems listed below.  Physiatrist and rehab team continue to assess barriers to discharge/monitor patient progress toward functional and medical goals  Care Tool:  Bathing    Body parts bathed by patient: Right arm, Left arm, Chest, Abdomen, Front perineal area, Right upper leg, Left upper leg, Face   Body parts bathed by helper: Buttocks, Right lower leg, Left lower leg      Bathing assist Assist Level: Moderate Assistance - Patient 50 - 74%     Upper Body Dressing/Undressing Upper body dressing   What is the patient wearing?: Hospital gown only    Upper body assist Assist Level: Moderate Assistance - Patient 50 - 74%    Lower Body Dressing/Undressing Lower body dressing      What is the patient wearing?: Incontinence brief     Lower body assist Assist for lower body dressing: Moderate Assistance - Patient 50 - 74%     Toileting Toileting Toileting Activity did not occur Press photographer and hygiene only): Refused  Toileting assist Assist for toileting: Moderate Assistance - Patient 50 - 74% Assistive Device Comment: urinal   Transfers Chair/bed transfer  Transfers assist     Chair/bed transfer assist level: Minimal Assistance - Patient > 75%     Locomotion Ambulation   Ambulation assist      Assist level: Moderate Assistance - Patient 50 - 74% Assistive device: Walker-rolling Max distance: 10'   Walk 10 feet activity   Assist     Assist level: Moderate Assistance - Patient - 50 - 74% Assistive device: Walker-rolling   Walk 50 feet activity   Assist    Assist level: Moderate Assistance - Patient - 50 - 74% Assistive device: Walker-rolling    Walk 150 feet activity   Assist Walk 150 feet activity did not occur: Safety/medical concerns  Assist level: (per report)      Walk 10 feet on uneven surface  activity   Assist Walk 10 feet on uneven surfaces activity  did not occur: Safety/medical concerns         Wheelchair     Assist Will patient use wheelchair at discharge?: No             Wheelchair 50 feet with 2 turns activity    Assist            Wheelchair 150 feet activity     Assist          Blood pressure (!) 94/58, pulse (!) 55, temperature 97.9 F (36.6 C), temperature source Oral, resp. rate 16, weight 51.7 kg, SpO2 95 %.  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 Antithrombotics: -DVT/anticoagulation:Pharmaceutical:Lovenox -antiplatelet therapy: N/A 3. Pain Management:Tramadol or Tylenol prn for abdominal pain. 4. Mood:LCSW to follow for evaluation and support -antipsychotic agents: N/A 5. Neuropsych: This patientis not fullycapable of making decisions on hisown behalf. 6. Skin/Wound Care:routine pressure relief measures 7. Fluids/Electrolytes/Nutrition: 8. 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 10. Malnutrition/Severe dysphagia: see above.  Unable to tolerate bolus tube feeds, will convert back to continuous and consider trial of bolus feeds next week  N.p.o.  Formula changed to nondairy on 12/17 11. Hypomagnesemia: IV supplement as needed. added supplement thru peg.   Mag 1.8 on 12/12, labs ordered for tomorrow 12.  Hypotension  Remain soft on 12/17  Midodrine increased to 5 TID on 09/11/2019, communicated with pharmacy 13.  Hyponatremia  Sodium 133 on 12/14, labs ordered for tomorrow  Continue to monitor 14.  Leukopenia-secondary to medications  WBCs 2.1 on 09/11/2019  Continue to monitor 15.  Acute blood loss anemia  Hemoglobin 11.0 on 12/16  Continue to monitor 16.  Labile blood glucose  Labile on 12/17 17.  Slow transit constipation  Bowel meds increased on 12/16  LOS: 5 days A FACE TO FACE EVALUATION WAS PERFORMED  Tres Grzywacz Lorie Phenix 09/12/2019, 10:04 AM

## 2019-09-13 ENCOUNTER — Encounter (HOSPITAL_COMMUNITY): Payer: Self-pay | Admitting: Speech Pathology

## 2019-09-13 ENCOUNTER — Inpatient Hospital Stay (HOSPITAL_COMMUNITY): Payer: Self-pay | Admitting: Physical Therapy

## 2019-09-13 ENCOUNTER — Inpatient Hospital Stay (HOSPITAL_COMMUNITY): Payer: Self-pay | Admitting: Occupational Therapy

## 2019-09-13 ENCOUNTER — Inpatient Hospital Stay (HOSPITAL_COMMUNITY): Payer: Medicaid Other

## 2019-09-13 DIAGNOSIS — D702 Other drug-induced agranulocytosis: Secondary | ICD-10-CM

## 2019-09-13 DIAGNOSIS — I952 Hypotension due to drugs: Secondary | ICD-10-CM

## 2019-09-13 DIAGNOSIS — E871 Hypo-osmolality and hyponatremia: Secondary | ICD-10-CM

## 2019-09-13 LAB — BASIC METABOLIC PANEL
Anion gap: 7 (ref 5–15)
BUN: 9 mg/dL (ref 6–20)
CO2: 25 mmol/L (ref 22–32)
Calcium: 8.9 mg/dL (ref 8.9–10.3)
Chloride: 101 mmol/L (ref 98–111)
Creatinine, Ser: 0.79 mg/dL (ref 0.61–1.24)
GFR calc Af Amer: >60 mL/min (ref >60)
GFR calc non Af Amer: >60 mL/min (ref >60)
Glucose, Bld: 107 mg/dL — ABNORMAL HIGH (ref 70–99)
Potassium: 4.2 mmol/L (ref 3.5–5.1)
Sodium: 133 mmol/L — ABNORMAL LOW (ref 135–145)

## 2019-09-13 LAB — GLUCOSE, CAPILLARY
Glucose-Capillary: 103 mg/dL — ABNORMAL HIGH (ref 70–99)
Glucose-Capillary: 106 mg/dL — ABNORMAL HIGH (ref 70–99)
Glucose-Capillary: 107 mg/dL — ABNORMAL HIGH (ref 70–99)
Glucose-Capillary: 118 mg/dL — ABNORMAL HIGH (ref 70–99)
Glucose-Capillary: 69 mg/dL — ABNORMAL LOW (ref 70–99)
Glucose-Capillary: 72 mg/dL (ref 70–99)
Glucose-Capillary: 90 mg/dL (ref 70–99)

## 2019-09-13 LAB — MAGNESIUM: Magnesium: 1.7 mg/dL (ref 1.7–2.4)

## 2019-09-13 MED ORDER — MAGNESIUM OXIDE 400 (241.3 MG) MG PO TABS
400.0000 mg | ORAL_TABLET | Freq: Two times a day (BID) | ORAL | Status: DC
  Administered 2019-09-13 – 2019-09-17 (×8): 400 mg
  Filled 2019-09-13 (×8): qty 1

## 2019-09-13 MED ORDER — VITAL 1.5 CAL PO LIQD
1000.0000 mL | ORAL | Status: DC
  Administered 2019-09-13 (×2): 1000 mL
  Filled 2019-09-13 (×4): qty 1000

## 2019-09-13 NOTE — Progress Notes (Signed)
Physical Therapy Session Note  Patient Details  Name: Arthur [name] MRN: [id number] Date of Birth: 1981/02/28  Today's Date: 09/13/2019 PT Individual Time: 1100-1130 PT Individual Time Calculation (min): 30 min   Short Term Goals: Week 1:  PT Short Term Goal 1 (Week 1): Pt will ambulate 100 ft with LRAD and min assist PT Short Term Goal 2 (Week 1): Pt will ascend/descend 8 steps with rails and min assist PT Short Term Goal 3 (Week 1): Pt will perform sit<>stand with CGA  Skilled Therapeutic Interventions/Progress Updates:  Session 1  Pt received supine in bed and agreeable to PT. Supine>sit transfer with supevision with cues for use of bed rails. Sitting balance EOB with supervision assist on this day for PT set up RW for transfer and tighten gown. Stand pivot transfer to [facility] with min assist and RW. Pt reports incontinent bowel movement. Sit<>stand from [facility] with min assist and min  Assist from PT to prevent posterior LOB while PT performed pericare and managed clothing to replace clean brief. Pt then requesting to return to bed. Pt performed ambulatory transfer to bed with min assist and cues for wide BOS, which is noted to be greatly improved from previous session. Sit>supine completed with supervision assist, and left supine in bed with call bell in reach and all needs met.     Session 2.  Per RN, pt's Blood Glucose level in the 60's and she will be providing him with juice and crackers in the near future. PT Will re-attempt at later time/date.      Therapy Documentation Precautions:  Precautions Precautions: Fall Precaution Comments: PEG, foley, visual field loss Rt eye Restrictions Weight Bearing Restrictions: No General: PT Amount of Missed Time (min): 30 Minutes +30 min  PT Missed Treatment Reason: Patient fatigue Pt ill ( low blood glucose)    Pain:   denies   Therapy/Group: Individual Therapy  Lorie Phenix 09/13/2019, 3:02 PM

## 2019-09-13 NOTE — Plan of Care (Signed)
Goal added to POC due to progress with swallowing  Problem: RH Eating Goal: LTG Patient will perform eating w/assist, cues/equip (OT) Description: LTG: Patient will perform eating with assist, with/without cues using equipment (OT) Flowsheets (Taken 09/13/2019 1553) LTG: Pt will perform eating with assistance level of: Supervision/Verbal cueing

## 2019-09-13 NOTE — Progress Notes (Signed)
Hypoglycemic Event  CBG: 69     Treatment: 4 oz juice/soda  Symptoms: None  Follow-up CBG: Time:1750 CBG Result:118  Possible Reasons for Event: Unknown  Comments/MD notified: No    Arthur [name]

## 2019-09-13 NOTE — Progress Notes (Signed)
Nutrition Follow-up  DOCUMENTATION CODES:   Underweight, Severe malnutrition in context of chronic illness  INTERVENTION:   Continuous tube feeding via PEG: - Vital 1.5 @ 40 ml/hr (tube feeding can be held for up to 4 hours for therapies) - Free water per MD/PA, currently 100 ml q 8 hours  Tube feeding regimen and current free water provides 1200 kcal, 54 grams of protein, and 611 ml of H2O (55% kcal needs, 51% protein needs).  - Continue liquid MVI daily per tube  - Vital Cuisine Shake TID with meals, each supplement provides 520 kcal and 22 grams of protein  NUTRITION DIAGNOSIS:   Severe Malnutrition related to chronic illness (HIV, dysphagia) as evidenced by severe fat depletion, severe muscle depletion, percent weight loss (19.2% weight loss in 3 months).  Ongoing, being addressed via TF, diet advancement, and oral nutrition supplements  GOAL:   Patient will meet greater than or equal to 90% of their needs  Progressing  MONITOR:   PO intake, Supplement acceptance, Diet advancement, Labs, Weight trends, TF tolerance, Skin, I & O's  REASON FOR ASSESSMENT:   Consult Enteral/tube feeding initiation and management  ASSESSMENT:   38 year old male with PMH of HIV, substance abuse, depression, GERD. Pt presented to ED on 07/28/19 with dizziness, weakness, and intermittent headaches. Pt had been noncompliant with his HIV drugs due to lack of insurance/affordibility issues. MRI brain done revealing widespread areas of diffusion abnormal contrast in both cerebral hemispheres predominantly left anterior frontal matter concerning for opportunistic infection as well as premature generalized atrophy. 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 Palliative care consulted to discuss GOC and mother elected on aggressive treatment.  He has been maintained on tube feeds  with NPO due to severe dysphagia with difficulty handing oral secretions. PEG placed on 12/07. Pt admitted to CIR on 09/07/19.  Discussed pt with SLP. Diet advanced to Dysphagia 2 diet with nectar-thick liquids after MBSS. Per SLP, pt still vomiting tube feeds and had a urinal filled with tube feeding formula when he arrived for MBSS. Discussed with PA. Plan to decrease TF rate to 40 ml/hr to promote tolerance and promote PO intake. Discussed plan with RN.  Discussed plan with pt at bedside. Pt states he is no longer vomiting. However, noted some tube feeding formula in urinal at bedside.  Weight up 0.2 lbs today. Will continue to monitor trends.  Current TF: Vital 1.5 @ 80 ml/hr (tube feeding can be held for up to 4 hours for therapies), free water 100 ml q 8 hours  Medications reviewed and include: Pepcid, magnesium oxide, liquid MVI, Miralax, senna  Labs reviewed: sodium 133 CBG's: 72-107 x 24 hours  Diet Order:   Diet Order            DIET DYS 2 Room service appropriate? Yes; Fluid consistency: Nectar Thick  Diet effective now              EDUCATION NEEDS:   No education needs have been identified at this time  Skin:  Skin Assessment: Skin Integrity Issues: Stage II: sacrum  Last BM:  09/12/19 medium type 5  Height:   Ht Readings from Last 1 Encounters:  07/29/19 5\' 11"  (1.803 m)    Weight:   Wt Readings from Last 1 Encounters:  09/13/19 51.8 kg    Ideal Body Weight:  78.2 kg  BMI:  Body mass index is 15.93 kg/m.  Estimated Nutritional Needs:   Kcal:  2200-2400  Protein:  105-120 grams  Fluid:  >/= 1.8 L    Gaynell Face, MS, RD, LDN Inpatient Clinical Dietitian Pager: [phone] Weekend/After Hours: [phone]

## 2019-09-13 NOTE — Progress Notes (Signed)
Occupational Therapy Session Note  Patient Details  Name: Arthur [name] MRN: [id number] Date of Birth: [birth year]  Today's Date: 09/13/2019 OT Individual Time: 1300-1414 OT Individual Time Calculation (min): 74 min   Short Term Goals: Week 1:  OT Short Term Goal 1 (Week 1): Pt will don footwear with Mod A OT Short Term Goal 2 (Week 1): Pt will complete toilet transfer with Min A using LRAD OT Short Term Goal 3 (Week 1): Pt will complete 1 grooming task while standing at the sink to improve balance  Skilled Therapeutic Interventions/Progress Updates:    Pt greeted in bed, requesting to eat his lunch and very eager to eat after passing his MBS(!). Supervision for supine<sit and Mod A for stand pivot<w/c. OT performed oral care using suction toothbrush. Pt then ate his lunch with supervision assist, vcs for slowing pace and consuming small bites. OT gave him nectar-thickened beverages via spoon. Note min-mod spillage due to FM deficits bilaterally and some impulsivity when reaching for/scooping food. He ate every bite on his plate and all of his magic cup. Afterwards pt completed oral care using suction toothbrush as well as hand washing and face washing while sitting at the sink. We changed his hospital gown as it had quite a bit of food on it. Vcs for threading Lt arm. Had pt fold 1 hand towel, focusing on Lt coordination. It took him several minutes and several attempts to fold evenly after OT provided step by step demonstration. Pt reported the hardest part about the activity was coordinating his Lt hand. Worked on increasing visual attendance to limb to address this during task. Afterwards he wanted to return to bed due to fatigue. Squat pivot<bed completed with Min A and vcs for awareness of tube feed safety. He was able to boost himself up with instruction. Pt remained in bed with HOB >30 degrees, bed alarm set, and call bell within reach.     Therapy Documentation Precautions:   Precautions Precautions: Fall Precaution Comments: PEG, foley, visual field loss Rt eye Restrictions Weight Bearing Restrictions: No Vital Signs: Therapy Vitals Temp: 98.7 F (37.1 C) Pulse Rate: 72 Resp: 18 BP: 91/60 Patient Position (if appropriate): Lying Oxygen Therapy SpO2: 98 % O2 Device: Room Air Pain: No c/o pain during session    ADL: ADL Eating: NPO Grooming: Moderate assistance Where Assessed-Grooming: Bed level, Edge of bed Upper Body Bathing: Supervision/safety Where Assessed-Upper Body Bathing: Edge of bed Lower Body Bathing: Moderate assistance Where Assessed-Lower Body Bathing: Edge of bed Upper Body Dressing: Minimal assistance Where Assessed-Upper Body Dressing: Edge of bed Lower Body Dressing: Maximal assistance Where Assessed-Lower Body Dressing: Edge of bed Toileting: Not assessed Toilet Transfer: Moderate assistance Toilet Transfer Method: Stand pivot Toilet Transfer Equipment: Engineer, technical sales Transfer: Not assessed    Therapy/Group: Individual Therapy  Esaul Dorwart A Alvenia Treese 09/13/2019, 3:55 PM

## 2019-09-13 NOTE — Progress Notes (Signed)
Modified Barium Swallow Progress Note  Patient Details  Name: Arthur [name] MRN: [id number] Date of Birth: Oct 08, 1980  Today's Date: 09/13/2019  Modified Barium Swallow completed.  Full report located under Chart Review in the Imaging Section.  Brief recommendations include the following:  Clinical Impression  Pt presents with moderate oropharyngeal dysphagia. Pt's oral phase appears much improved over previous study. During this study he demonstrated improved oral containment of all boluses, active lingual movement and mastication of boluses. This results in improved bolus cohesion and timely posterior propulsion of boluses.  He continues to exhibit base of tongue weakness that results in premature spillage of bolus. Pt's pharyngeal phase is c/b swallow initiation at the vallecula with puree and granola bar. When consuming honey thick liquids, nectar thick liquids and thin liquids, his swallow initiation is delayed to pyriform sinuses. This results in aspiration before and during the swallow of thin liquids and when consuming larger boluses of nectar thick liquids via cup.  When consuming honey thick liquids and nectar thick liquids via spooon, pt is able to contain the bolus within his pyriform sinuses and protect his airway. Trace reside within laryngeal vestible following aspiration of thin liquids was later silently aspirated with next bolus. Pt intermittently sensed aspiration but intermittent coughing, he was not effective in clearing aspirates.  Recommend dysphagia 2 diet with nectar thick liquids via spoon, oral care before and after PO intake, medicine crushed in puree with full nursing supervision during consumption.   Swallow Evaluation Recommendations       SLP Diet Recommendations: Dysphagia 2 (Fine chop) solids;Nectar thick liquid   Liquid Administration via: Spoon   Medication Administration: Crushed with puree   Supervision: Staff to assist with self feeding;Full  supervision/cueing for compensatory strategies   Compensations: Minimize environmental distractions;Slow rate;Small sips/bites   Postural Changes: Seated upright at 90 degrees   Oral Care Recommendations: Oral care before and after PO(With suction toothbrush)   Other Recommendations: Have oral suction available    Arthur [name] 09/13/2019,12:38 PM

## 2019-09-13 NOTE — Plan of Care (Signed)
  Problem: Consults Goal: RH GENERAL PATIENT EDUCATION Description: See Patient Education module for education specifics. Outcome: Progressing   Problem: RH BOWEL ELIMINATION Goal: RH STG MANAGE BOWEL WITH ASSISTANCE Description: STG Manage Bowel with min Assistance. Outcome: Progressing   Problem: RH BLADDER ELIMINATION Goal: RH STG MANAGE BLADDER WITH ASSISTANCE Description: STG Manage Bladder With min Assistance Outcome: Progressing   Problem: RH SKIN INTEGRITY Goal: RH STG SKIN FREE OF INFECTION/BREAKDOWN Description: No new skin breakdown Outcome: Progressing Goal: RH STG MAINTAIN SKIN INTEGRITY WITH ASSISTANCE Description: STG Maintain Skin Integrity With min Assistance. Outcome: Progressing Goal: RH STG ABLE TO PERFORM INCISION/WOUND CARE W/ASSISTANCE Description: STG Able To Perform Incision/Wound Care With min Assistance. Outcome: Progressing   Problem: RH SAFETY Goal: RH STG ADHERE TO SAFETY PRECAUTIONS W/ASSISTANCE/DEVICE Description: STG Adhere to Safety Precautions With min Assistance/Device. Outcome: Progressing   Problem: RH KNOWLEDGE DEFICIT GENERAL Goal: RH STG INCREASE KNOWLEDGE OF SELF CARE AFTER HOSPITALIZATION Outcome: Progressing   

## 2019-09-13 NOTE — Progress Notes (Signed)
[facility] PHYSICAL MEDICINE & REHABILITATION PROGRESS NOTE   Subjective/Complaints: Patient seen laying in bed this morning.  He states he slept well overnight, confirmed with sleep chart.  He states he is tolerating the need to feed formula better.  ROS: Denies CP, SOB, N/V/D  Objective:   No results found. Recent Labs    09/11/19 0459  WBC 2.1*  HGB 11.0*  HCT 33.0*  PLT 215   Recent Labs    09/13/19 0637  NA 133*  K 4.2  CL 101  CO2 25  GLUCOSE 107*  BUN 9  CREATININE 0.79  CALCIUM 8.9    Intake/Output Summary (Last 24 hours) at 09/13/2019 1003 Last data filed at 09/13/2019 0651 Gross per 24 hour  Intake 1000 ml  Output 450 ml  Net 550 ml     Physical Exam: Vital Signs Blood pressure (!) 90/56, pulse (!) 56, temperature 98 F (36.7 C), temperature source Oral, resp. rate 18, weight 51.8 kg, SpO2 100 %. Constitutional: No distress . Vital signs reviewed.  Cachectic. HENT: Normocephalic.  Atraumatic. Eyes: EOMI. No discharge. Cardiovascular: No JVD. Respiratory: Normal effort.  No stridor. GI: Non-distended.  + PEG. Skin: Warm and dry.  Intact. Psych: Flat. Musc: No edema in extremities.  No tenderness in extremities. Neurological: Alert Dysarthria Motor: Grossly 4/5 throughout, stable  Assessment/Plan: 1. 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  Physiatrist is providing close team supervision and 24 hour management of active medical problems listed below.  Physiatrist and rehab team continue to assess barriers to discharge/monitor patient progress toward functional and medical goals  Care Tool:  Bathing    Body parts bathed by patient: Right arm, Left arm, Chest, Abdomen, Front perineal area, Right upper leg, Left upper leg, Face   Body parts bathed by helper: Buttocks, Right lower leg, Left lower leg     Bathing assist Assist  Level: Moderate Assistance - Patient 50 - 74%     Upper Body Dressing/Undressing Upper body dressing   What is the patient wearing?: Hospital gown only    Upper body assist Assist Level: Moderate Assistance - Patient 50 - 74%    Lower Body Dressing/Undressing Lower body dressing      What is the patient wearing?: Incontinence brief     Lower body assist Assist for lower body dressing: Moderate Assistance - Patient 50 - 74%     Toileting Toileting Toileting Activity did not occur Landscape architect and hygiene only): Refused  Toileting assist Assist for toileting: Moderate Assistance - Patient 50 - 74% Assistive Device Comment: urinal   Transfers Chair/bed transfer  Transfers assist     Chair/bed transfer assist level: Minimal Assistance - Patient > 75%     Locomotion Ambulation   Ambulation assist      Assist level: Moderate Assistance - Patient 50 - 74% Assistive device: Walker-rolling Max distance: 10'   Walk 10 feet activity   Assist     Assist level: Moderate Assistance - Patient - 50 - 74% Assistive device: Walker-rolling   Walk 50 feet activity   Assist    Assist level: Moderate Assistance - Patient - 50 - 74% Assistive device: Walker-rolling    Walk 150 feet activity   Assist Walk 150 feet activity did not occur: Safety/medical concerns  Assist level: (per report)      Walk 10 feet on uneven surface  activity   Assist Walk 10  feet on uneven surfaces activity did not occur: Safety/medical concerns         Wheelchair     Assist Will patient use wheelchair at discharge?: No             Wheelchair 50 feet with 2 turns activity    Assist            Wheelchair 150 feet activity     Assist          Blood pressure (!) 90/56, pulse (!) 56, temperature 98 F (36.7 C), temperature source Oral, resp. rate 18, weight 51.8 kg, SpO2 100 %.  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 Antithrombotics: -DVT/anticoagulation:Pharmaceutical:Lovenox -antiplatelet therapy: N/A 3. Pain Management:Tramadol or Tylenol prn for abdominal pain. 4. Mood:LCSW to follow for evaluation and support -antipsychotic agents: N/A 5. Neuropsych: This patientis not fullycapable of making decisions on hisown behalf. 6. Skin/Wound Care:routine pressure relief measures 7. Fluids/Electrolytes/Nutrition: 8. 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 10. Malnutrition/Severe dysphagia: see above.  Unable to tolerate bolus tube feeds, converted back to continuous.  Plan for trial bolus feeds early next week  N.p.o. we will plan for MBS today  Formula changed to nondairy on 12/17 11. Hypomagnesemia: IV supplement as needed. added supplement thru peg, increased on 12/18.   Mag 1.7 on 12/18 12.  Hypotension  Remain soft on 12/18  Midodrine increased to 5 TID on 09/11/2019, communicated with pharmacy 13.  Hyponatremia  Sodium 132 on 12/18  Continue to monitor 14.  Leukopenia-secondary to medications  WBCs 2.1 on 09/11/2019, labs ordered for Monday  Continue to monitor 15.  Acute blood loss anemia  Hemoglobin 11.0 on 12/16, labs ordered for Monday  Continue to monitor 16.  Labile blood glucose  Controlled on 12/18 17.  Slow transit constipation  Bowel meds increased on 12/16  LOS: 6 days A FACE TO FACE EVALUATION WAS PERFORMED  Susie Ehresman Karis Juba 09/13/2019, 10:03 AM

## 2019-09-14 ENCOUNTER — Inpatient Hospital Stay (HOSPITAL_COMMUNITY): Payer: Self-pay | Admitting: Speech Pathology

## 2019-09-14 DIAGNOSIS — Z931 Gastrostomy status: Secondary | ICD-10-CM

## 2019-09-14 LAB — GLUCOSE, CAPILLARY
Glucose-Capillary: 104 mg/dL — ABNORMAL HIGH (ref 70–99)
Glucose-Capillary: 112 mg/dL — ABNORMAL HIGH (ref 70–99)
Glucose-Capillary: 83 mg/dL (ref 70–99)
Glucose-Capillary: 84 mg/dL (ref 70–99)
Glucose-Capillary: 86 mg/dL (ref 70–99)
Glucose-Capillary: 96 mg/dL (ref 70–99)

## 2019-09-14 NOTE — Progress Notes (Signed)
Speech Language Pathology Daily Session Note  Patient Details  Name: Arthur [name] MRN: [id number] Date of Birth: May 07, 1981  Today's Date: 09/14/2019 SLP Individual Time: 0940-1020 SLP Individual Time Calculation (min): 40 min  Short Term Goals: Week 1: SLP Short Term Goal 1 (Week 1): Pt will consume therapeutic trials of ice chips with minimal overt s/s of aspiration and min cues for use of swallowing precautions over 3 consecutive sessions prior to repeat instrumental assessment. SLP Short Term Goal 2 (Week 1): Pt will use a volitional cough or throat clear to clear his secretions and vocal wetness with mod verbal cues in 75% of opportunities. SLP Short Term Goal 3 (Week 1): Pt will increase vocal intensity and overarticulate to achieve speech intelligibility at the word level with mod assist verbal cues. SLP Short Term Goal 4 (Week 1): Pt will use external aids for recall of daily information with mod assist verbal and visual cues. SLP Short Term Goal 5 (Week 1): Pt will complete mildly complex tasks with min assist verbal cues for functional problem solving.  Skilled Therapeutic Interventions: Skilled treatment session focused on dysphagia and speech goals. SLP facilitated session by providing supervision verbal cues for problem solving and thoroughness with oral care via the suction toothbrush prior to PO intake.  Patient consumed nectar-thick liquids via tsp with poor lip seal and decreased oral control with use of multiple swallows with an intermittent delayed cough. Patient with an intermittent wet, congested voice/cough at baseline so difficult to differientiate between possible s/s of aspiration. Recommend patient continue current diet with full supervision. Patient unable to recall his speech intelligibility strategies but educated on importance of "talking big." Patient was ~80% intelligible at the phrase and sentence level during an informal conversation. Patient left upright in bed  with alarm on and all needs within reach. Continue with current plan of care.      Pain Pain Assessment Pain Scale: 0-10 Pain Score: 0-No pain  Therapy/Group: Individual Therapy  Wren Gallaga 09/14/2019, 12:38 PM

## 2019-09-14 NOTE — Progress Notes (Signed)
[facility] PHYSICAL MEDICINE & REHABILITATION PROGRESS NOTE   Subjective/Complaints: Patient seen sitting up in bed this morning.  He states he slept well overnight.  He notes improvement in strength.  ROS: Denies CP, SOB, N/V/D  Objective:   DG Swallowing Func-Speech Pathology  Result Date: 09/13/2019 Objective Swallowing Evaluation: Type of Study: MBS-Modified Barium Swallow Study  Patient Details Name: ALESSIO BOGAN MRN: [id number] Date of Birth: [birth year] Today's Date: 09/13/2019 Time: SLP Start Time (ACUTE ONLY): 4098 -SLP Stop Time (ACUTE ONLY): 0952 SLP Time Calculation (min) (ACUTE ONLY): 18 min Past Medical History: Past Medical History: Diagnosis Date . Anemia  . Anxiety  . Depression  . GERD (gastroesophageal reflux disease)  . HIV infection (HCC)  . Substance abuse [facility])  Past Surgical History: Past Surgical History: Procedure Laterality Date . IR GASTROSTOMY TUBE MOD SED  09/02/2019 . TOOTH EXTRACTION   HPI: Pt is a 38 y.o. male admitted with neurosyphillis, CNS toxoplasmosis. MRI 11/1 showed widespread areas concerning for infection, with most dominant abnormality in the L anterior frontal white matter. Hospital course was complicated by hyponatremia and respiratory decline, transferred to ICU on 11/14 but not requiring intubation. Multiple CXRs this admission without acute findings. PMH: substance abuse, HIV, GERD, depression, anxiety, anemia  Subjective: pt upset after swallow study Assessment / Plan / Recommendation CHL IP CLINICAL IMPRESSIONS 09/13/2019 Clinical Impression Pt presents with moderate oropharyngeal dysphagia. Pt's oral phase appears much improved over previous study. During this study he demonstrated improved oral containment of all boluses, active lingual movement and mastication of boluses. This results in improved bolus cohesion and timely posterior propulsion of boluses.  He continues to exhibit base of tongue weakness that results in premature spillage of bolus.  Pt's pharyngeal phase is c/b swallow initiation at the vallecula with puree and granola bar. When consuming honey thick liquids, nectar thick liquids and thin liquids, his swallow initiation is delayed to pyriform sinuses. This results in aspiration before and during the swallow of thin liquids and when consuming larger boluses of nectar thick liquids via cup.  When consuming honey thick liquids and nectar thick liquids via spooon, pt is able to contain the bolus within his pyriform sinuses and protect his airway. Trace reside within laryngeal vestible following aspiration of thin liquids was later silently aspirated with next bolus. Pt intermittently sensed aspiration but intermittent coughing, he was not effective in clearing aspirates.  Recommend dysphagia 2 diet with nectar thick liquids via spoon, oral care before and after PO intake, medicine crushed in puree with full nursing supervision during consumption. SLP Visit Diagnosis Dysphagia, oropharyngeal phase (R13.12) Attention and concentration deficit following -- Frontal lobe and executive function deficit following -- Impact on safety and function Moderate aspiration risk   CHL IP TREATMENT RECOMMENDATION 09/13/2019 Treatment Recommendations Therapy as outlined in treatment plan below   Prognosis 08/23/2019 Prognosis for Safe Diet Advancement Good Barriers to Reach Goals Severity of deficits Barriers/Prognosis Comment -- CHL IP DIET RECOMMENDATION 09/13/2019 SLP Diet Recommendations Dysphagia 2 (Fine chop) solids;Nectar thick liquid Liquid Administration via Spoon Medication Administration Crushed with puree Compensations Minimize environmental distractions;Slow rate;Small sips/bites Postural Changes Seated upright at 90 degrees   CHL IP OTHER RECOMMENDATIONS 09/13/2019 Recommended Consults -- Oral Care Recommendations Oral care before and after PO Other Recommendations Have oral suction available   CHL IP FOLLOW UP RECOMMENDATIONS 09/13/2019 Follow up  Recommendations Inpatient Rehab   CHL IP FREQUENCY AND DURATION 08/23/2019 Speech Therapy Frequency (ACUTE ONLY) min 2x/week Treatment Duration 2 weeks  CHL IP ORAL PHASE 09/13/2019 Oral Phase Impaired Oral - Pudding Teaspoon -- Oral - Pudding Cup -- Oral - Honey Teaspoon Premature spillage Oral - Honey Cup -- Oral - Nectar Teaspoon Premature spillage Oral - Nectar Cup Premature spillage Oral - Nectar Straw -- Oral - Thin Teaspoon Premature spillage Oral - Thin Cup Premature spillage Oral - Thin Straw NT Oral - Puree WFL Oral - Mech Soft WFL Oral - Regular -- Oral - Multi-Consistency -- Oral - Pill -- Oral Phase - Comment --  CHL IP PHARYNGEAL PHASE 09/13/2019 Pharyngeal Phase Impaired Pharyngeal- Pudding Teaspoon -- Pharyngeal -- Pharyngeal- Pudding Cup -- Pharyngeal -- Pharyngeal- Honey Teaspoon Delayed swallow initiation-pyriform sinuses Pharyngeal -- Pharyngeal- Honey Cup Delayed swallow initiation-pyriform sinuses Pharyngeal -- Pharyngeal- Nectar Teaspoon Delayed swallow initiation-pyriform sinuses Pharyngeal -- Pharyngeal- Nectar Cup Delayed swallow initiation-pyriform sinuses;Penetration/Aspiration during swallow;Penetration/Aspiration before swallow;Moderate aspiration Pharyngeal Material enters airway, passes BELOW cords without attempt by patient to eject out (silent aspiration);Material enters airway, passes BELOW cords and not ejected out despite cough attempt by patient Pharyngeal- Nectar Straw -- Pharyngeal -- Pharyngeal- Thin Teaspoon Delayed swallow initiation-pyriform sinuses;Penetration/Aspiration before swallow;Penetration/Aspiration during swallow;Moderate aspiration Pharyngeal Material enters airway, passes BELOW cords without attempt by patient to eject out (silent aspiration);Material enters airway, passes BELOW cords and not ejected out despite cough attempt by patient Pharyngeal- Thin Cup Penetration/Aspiration during swallow;Penetration/Aspiration before swallow;Moderate aspiration  Pharyngeal Material enters airway, passes BELOW cords without attempt by patient to eject out (silent aspiration);Material enters airway, passes BELOW cords and not ejected out despite cough attempt by patient Pharyngeal- Thin Straw -- Pharyngeal -- Pharyngeal- Puree Delayed swallow initiation-vallecula Pharyngeal -- Pharyngeal- Mechanical Soft Delayed swallow initiation-vallecula Pharyngeal -- Pharyngeal- Regular -- Pharyngeal -- Pharyngeal- Multi-consistency -- Pharyngeal -- Pharyngeal- Pill -- Pharyngeal -- Pharyngeal Comment --  CHL IP CERVICAL ESOPHAGEAL PHASE 09/13/2019 Cervical Esophageal Phase WFL Pudding Teaspoon -- Pudding Cup -- Honey Teaspoon -- Honey Cup -- Nectar Teaspoon -- Nectar Cup -- Nectar Straw -- Thin Teaspoon -- Thin Cup -- Thin Straw -- Puree -- Mechanical Soft -- Regular -- Multi-consistency -- Pill -- Cervical Esophageal Comment -- Happi Overton 09/13/2019, 12:39 PM              No results for input(s): WBC, HGB, HCT, PLT in the last 72 hours. Recent Labs    09/13/19 0637  NA 133*  K 4.2  CL 101  CO2 25  GLUCOSE 107*  BUN 9  CREATININE 0.79  CALCIUM 8.9    Intake/Output Summary (Last 24 hours) at 09/14/2019 1138 Last data filed at 09/14/2019 7425 Gross per 24 hour  Intake 356 ml  Output -  Net 356 ml     Physical Exam: Vital Signs Blood pressure (!) 88/61, pulse (!) 59, temperature 98 F (36.7 C), resp. rate 18, weight 53.1 kg, SpO2 100 %. Constitutional: No distress . Vital signs reviewed.  Cachectic. HENT: Normocephalic.  Atraumatic. Eyes: EOMI. No discharge. Cardiovascular: No JVD. Respiratory: Normal effort.  No stridor. GI: Non-distended. +PEG Skin: Warm and dry.  Intact. Psych: Flat. Musc: No edema in extremities.  No tenderness in extremities. Neurological: Alert Dysarthria Motor: Grossly 4/5 throughout, improving  Assessment/Plan: 1. 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  Physiatrist is providing close team supervision and 24 hour management of active medical problems listed below.  Physiatrist and rehab team continue to assess barriers to discharge/monitor patient progress toward functional and medical goals  Care Tool:  Bathing  Bathing activity did not occur: Refused Body parts bathed by patient: Right arm, Left arm, Chest, Abdomen, Front perineal area, Right upper leg, Left upper leg, Face   Body parts bathed by helper: Buttocks, Right lower leg, Left lower leg     Bathing assist Assist Level: Moderate Assistance - Patient 50 - 74%     Upper Body Dressing/Undressing Upper body dressing   What is the patient wearing?: Hospital gown only    Upper body assist Assist Level: Moderate Assistance - Patient 50 - 74%    Lower Body Dressing/Undressing Lower body dressing      What is the patient wearing?: Incontinence brief     Lower body assist Assist for lower body dressing: Moderate Assistance - Patient 50 - 74%     Toileting Toileting Toileting Activity did not occur Press photographer(Clothing management and hygiene only): Refused  Toileting assist Assist for toileting: Moderate Assistance - Patient 50 - 74% Assistive Device Comment: urinal   Transfers Chair/bed transfer  Transfers assist     Chair/bed transfer assist level: Minimal Assistance - Patient > 75%     Locomotion Ambulation   Ambulation assist      Assist level: Minimal Assistance - Patient > 75% Assistive device: Walker-rolling Max distance: 8010ft   Walk 10 feet activity   Assist     Assist level: Minimal Assistance - Patient > 75% Assistive device: Walker-rolling   Walk 50 feet activity   Assist    Assist level: Moderate Assistance - Patient - 50 - 74% Assistive device: Walker-rolling    Walk 150 feet activity   Assist Walk 150 feet activity did not occur: Safety/medical concerns  Assist level: (per  report)      Walk 10 feet on uneven surface  activity   Assist Walk 10 feet on uneven surfaces activity did not occur: Safety/medical concerns         Wheelchair     Assist Will patient use wheelchair at discharge?: No      Wheelchair assist level: Supervision/Verbal cueing Max wheelchair distance: 15900ft    Wheelchair 50 feet with 2 turns activity    Assist        Assist Level: Supervision/Verbal cueing   Wheelchair 150 feet activity     Assist          Blood pressure (!) 88/61, pulse (!) 59, temperature 98 F (36.7 C), resp. rate 18, weight 53.1 kg, SpO2 100 %.  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 Antithrombotics: -DVT/anticoagulation:Pharmaceutical:Lovenox -antiplatelet therapy: N/A 3. Pain Management:Tramadol or Tylenol prn for abdominal pain. 4. Mood:LCSW to follow for evaluation and support -antipsychotic agents: N/A 5. Neuropsych: This patientis not fullycapable of making decisions on hisown behalf. 6. Skin/Wound Care:routine pressure relief measures 7. Fluids/Electrolytes/Nutrition: 8. 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 10. Malnutrition/Severe dysphagia: see above.  Unable to tolerate bolus tube feeds, converted back to continuous.  Plan for trial bolus feeds early next week  D2 Nectars started on 12/19  Formula changed to nondairy on 12/17 with improvement 11. Hypomagnesemia: IV supplement as needed. added supplement thru peg, increased on 12/18.   Mag 1.7 on 12/18 12.  Hypotension  Remain soft on 12/19   Midodrine increased to 5 TID on 09/11/2019, communicated with pharmacy 13.  Hyponatremia  Sodium 133 on 12/18  Continue to monitor 14.   Leukopenia-secondary to medications  WBCs 2.1 on 09/11/2019, labs ordered for Monday  Continue to monitor 15.  Acute blood loss anemia  Hemoglobin 11.0 on 12/16, labs ordered for Monday  Continue to monitor 16.  Labile blood glucose  Controlled on 12/19 17.  Slow transit constipation  Bowel meds increased on 12/16  LOS: 7 days A FACE TO FACE EVALUATION WAS PERFORMED  Kesha Hurrell Karis Juba 09/14/2019, 11:38 AM

## 2019-09-15 ENCOUNTER — Inpatient Hospital Stay (HOSPITAL_COMMUNITY): Payer: Self-pay

## 2019-09-15 LAB — GLUCOSE, CAPILLARY
Glucose-Capillary: 101 mg/dL — ABNORMAL HIGH (ref 70–99)
Glucose-Capillary: 101 mg/dL — ABNORMAL HIGH (ref 70–99)
Glucose-Capillary: 108 mg/dL — ABNORMAL HIGH (ref 70–99)
Glucose-Capillary: 91 mg/dL (ref 70–99)
Glucose-Capillary: 94 mg/dL (ref 70–99)
Glucose-Capillary: 97 mg/dL (ref 70–99)

## 2019-09-15 NOTE — Progress Notes (Signed)
[place] PHYSICAL MEDICINE & REHABILITATION PROGRESS NOTE   Subjective/Complaints: Patient seen laying in bed this morning.  He states he slept well overnight, confirmed with sleep chart.  He is looking forward to his day of relative rest.  ROS: Denies CP, SOB, N/V/D  Objective:   No results found. No results for input(s): WBC, HGB, HCT, PLT in the last 72 hours. Recent Labs    09/13/19 0637  NA 133*  K 4.2  CL 101  CO2 25  GLUCOSE 107*  BUN 9  CREATININE 0.79  CALCIUM 8.9    Intake/Output Summary (Last 24 hours) at 09/15/2019 1114 Last data filed at 09/15/2019 0450 Gross per 24 hour  Intake 580 ml  Output 350 ml  Net 230 ml     Physical Exam: Vital Signs Blood pressure 101/71, pulse (!) 57, temperature 98.2 F (36.8 C), resp. rate 16, weight 53 kg, SpO2 98 %. Constitutional: No distress . Vital signs reviewed.  Cachectic. HENT: Normocephalic.  Atraumatic. Eyes: EOMI. No discharge. Cardiovascular: No JVD. Respiratory: Normal effort.  No stridor. GI: Non-distended.  + PEG. Skin: Warm and dry.  Intact. Psych: Flat. Musc: No edema in extremities.  No tenderness in extremities. Neurological: Alert Dysarthria Motor: Grossly 4/5 throughout, stable  Assessment/Plan: 1. 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  Physiatrist is providing close team supervision and 24 hour management of active medical problems listed below.  Physiatrist and rehab team continue to assess barriers to discharge/monitor patient progress toward functional and medical goals  Care Tool:  Bathing  Bathing activity did not occur: Refused Body parts bathed by patient: Right arm, Left arm, Chest, Abdomen, Front perineal area, Right upper leg, Left upper leg, Face   Body parts bathed by helper: Buttocks, Right lower leg, Left lower leg     Bathing assist Assist Level: Moderate  Assistance - Patient 50 - 74%     Upper Body Dressing/Undressing Upper body dressing   What is the patient wearing?: Hospital gown only    Upper body assist Assist Level: Moderate Assistance - Patient 50 - 74%    Lower Body Dressing/Undressing Lower body dressing      What is the patient wearing?: Incontinence brief     Lower body assist Assist for lower body dressing: Moderate Assistance - Patient 50 - 74%     Toileting Toileting Toileting Activity did not occur Press photographer and hygiene only): Refused  Toileting assist Assist for toileting: Moderate Assistance - Patient 50 - 74% Assistive Device Comment: urinal   Transfers Chair/bed transfer  Transfers assist     Chair/bed transfer assist level: Minimal Assistance - Patient > 75%     Locomotion Ambulation   Ambulation assist      Assist level: Minimal Assistance - Patient > 75% Assistive device: Walker-rolling Max distance: 67ft   Walk 10 feet activity   Assist     Assist level: Minimal Assistance - Patient > 75% Assistive device: Walker-rolling   Walk 50 feet activity   Assist    Assist level: Moderate Assistance - Patient - 50 - 74% Assistive device: Walker-rolling    Walk 150 feet activity   Assist Walk 150 feet activity did not occur: Safety/medical concerns  Assist level: (per report)      Walk 10 feet on uneven surface  activity   Assist Walk 10 feet on uneven surfaces activity did not occur: Safety/medical concerns  Wheelchair     Assist Will patient use wheelchair at discharge?: No      Wheelchair assist level: Supervision/Verbal cueing Max wheelchair distance: 163ft    Wheelchair 50 feet with 2 turns activity    Assist        Assist Level: Supervision/Verbal cueing   Wheelchair 150 feet activity     Assist          Blood pressure 101/71, pulse (!) 57, temperature 98.2 F (36.8 C), resp. rate 16, weight 53 kg, SpO2 98  %.  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 Antithrombotics: -DVT/anticoagulation:Pharmaceutical:Lovenox -antiplatelet therapy: N/A 3. Pain Management:Tramadol or Tylenol prn for abdominal pain. 4. Mood:LCSW to follow for evaluation and support -antipsychotic agents: N/A 5. Neuropsych: This patientis not fullycapable of making decisions on hisown behalf. 6. Skin/Wound Care:routine pressure relief measures 7. Fluids/Electrolytes/Nutrition: 8. 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 10. Malnutrition/Severe dysphagia: see above.  Unable to tolerate bolus tube feeds, converted back to continuous.  Plan for trial bolus feeds tomorrow  D2 Nectars started on 12/19  Formula changed to nondairy on 12/17 with improvement 11. Hypomagnesemia: IV supplement as needed. added supplement thru peg, increased on 12/18.   Mag 1.7 on 12/18 12.  Hypotension  Soft, but asymptomatic on 12/20  Midodrine increased to 5 TID on 09/11/2019, communicated with pharmacy 13.  Hyponatremia  Sodium 133 on 12/18  Continue to monitor 14.  Leukopenia-secondary to medications  WBCs 2.1 on 09/11/2019, labs ordered for tomorrow  Continue to monitor 15.  Acute blood loss anemia  Hemoglobin 11.0 on 12/16, labs ordered for tomorrow  Continue to monitor 16.  Labile blood glucose-related to tube feeds  Controlled on 12/20 17.  Slow transit constipation  Bowel meds increased on 12/16  LOS: 8 days A FACE TO FACE EVALUATION WAS PERFORMED  Arthur [name] Lorie Phenix 09/15/2019, 11:14 AM

## 2019-09-15 NOTE — Progress Notes (Addendum)
Physical Therapy Session Note  Patient Details  Name: Arthur [name] MRN: [id number] Date of Birth: October 14, 1980  Today's Date: 09/15/2019 PT Individual Time: 0920-1000 PT Individual Time Calculation (min): 40 min   Short Term Goals: Week 1:  PT Short Term Goal 1 (Week 1): Pt will ambulate 100 ft with LRAD and min assist PT Short Term Goal 2 (Week 1): Pt will ascend/descend 8 steps with rails and min assist PT Short Term Goal 3 (Week 1): Pt will perform sit<>stand with CGA  Skilled Therapeutic Interventions/Progress Updates:     Patient in bed upon PT arrival. Patient alert and agreeable to PT session. Patient reported 6/10 stomach pain with nausea during session, RN aware. PT provided repositioning, rest breaks, and distraction as pain interventions throughout session. Vitals in supine at beginning of session: BP 81/46, HR 63. RN informed about low BP and took manual BP, 94/52. Preceded with bed level exercises to increase BP prior to sitting.   Therapeutic Exercise: Patient performed the following exercises with verbal and tactile cues for proper technique. -B ankle pumps x15 -B SLR with min A x8 -B hip and knee flexion/extension with manual resistance while extending x10 -Bridging x10 -B Isometric hip abduction with manual resistance x10 with 5 sec hold -B isometric hip adduction with pillow between knees x10 with 5 sec hold  Therapeutic Activity: Bed Mobility: Patient performed supine to/from sit with supervision with increased time to complete activity in a flat bed with min use of bed rails. Provided verbal cues for sliding LEs off the bed first before sitting up to reduce strain on his stomach. Patient sat EOB x5 min to work on sitting tolerance with supervision and min A x1 due to L lateral LOB. Provided cues for erect posture and activation of lateral trunk and back muscle for trunk control. Patient declined performing dynamic balance activities due to nausea. BP in sitting 97/67  HR 63, patient asymptomatic. Patient requested to return to lying in bed due to nausea. Patient missed 20 min of skilled PT due to nausea.  Patient in bed at end of session with breaks locked, bed alarm set, and all needs within reach.    Therapy Documentation Precautions:  Precautions Precautions: Fall Precaution Comments: PEG, foley, visual field loss Rt eye Restrictions Weight Bearing Restrictions: No    Therapy/Group: Individual Therapy  Anivea Velasques L Mayline Dragon PT, DPT  09/15/2019, 5:04 PM

## 2019-09-16 ENCOUNTER — Inpatient Hospital Stay (HOSPITAL_COMMUNITY): Payer: Self-pay | Admitting: Speech Pathology

## 2019-09-16 ENCOUNTER — Inpatient Hospital Stay (HOSPITAL_COMMUNITY): Payer: Self-pay

## 2019-09-16 ENCOUNTER — Inpatient Hospital Stay (HOSPITAL_COMMUNITY): Payer: Self-pay | Admitting: Occupational Therapy

## 2019-09-16 LAB — CBC
HCT: 32.5 % — ABNORMAL LOW (ref 39.0–52.0)
Hemoglobin: 11 g/dL — ABNORMAL LOW (ref 13.0–17.0)
MCH: 32.4 pg (ref 26.0–34.0)
MCHC: 33.8 g/dL (ref 30.0–36.0)
MCV: 95.9 fL (ref 80.0–100.0)
Platelets: 237 10*3/uL (ref 150–400)
RBC: 3.39 MIL/uL — ABNORMAL LOW (ref 4.22–5.81)
RDW: 17.7 % — ABNORMAL HIGH (ref 11.5–15.5)
WBC: 2.4 10*3/uL — ABNORMAL LOW (ref 4.0–10.5)
nRBC: 0 % (ref 0.0–0.2)

## 2019-09-16 LAB — BASIC METABOLIC PANEL
Anion gap: 8 (ref 5–15)
BUN: 10 mg/dL (ref 6–20)
CO2: 26 mmol/L (ref 22–32)
Calcium: 9 mg/dL (ref 8.9–10.3)
Chloride: 98 mmol/L (ref 98–111)
Creatinine, Ser: 0.76 mg/dL (ref 0.61–1.24)
GFR calc Af Amer: >60 mL/min (ref >60)
GFR calc non Af Amer: >60 mL/min (ref >60)
Glucose, Bld: 84 mg/dL (ref 70–99)
Potassium: 4 mmol/L (ref 3.5–5.1)
Sodium: 132 mmol/L — ABNORMAL LOW (ref 135–145)

## 2019-09-16 LAB — GLUCOSE, CAPILLARY
Glucose-Capillary: 109 mg/dL — ABNORMAL HIGH (ref 70–99)
Glucose-Capillary: 78 mg/dL (ref 70–99)
Glucose-Capillary: 89 mg/dL (ref 70–99)
Glucose-Capillary: 92 mg/dL (ref 70–99)
Glucose-Capillary: 98 mg/dL (ref 70–99)
Glucose-Capillary: 99 mg/dL (ref 70–99)

## 2019-09-16 MED ORDER — VITAL 1.5 CAL PO LIQD
1000.0000 mL | ORAL | Status: DC
  Administered 2019-09-16 (×2): 1000 mL
  Filled 2019-09-16: qty 1000

## 2019-09-16 NOTE — Progress Notes (Addendum)
Speech Language Pathology Daily Session Note  Patient Details  Name: Arthur [name] MRN: [id number] Date of Birth: May 13, 1981  Today's Date: 09/16/2019 SLP Individual Time: 1345-1430 SLP Individual Time Calculation (min): 45 min  Short Term Goals: Week 2: SLP Short Term Goal 1 (Week 2): Pt will consume current Dys 2/nectar diet with Min overt s/sx aspiration and efficient mastication and oral clearance with Mod A verbal cues for use of swallow strategies. SLP Short Term Goal 2 (Week 2): Pt will consume therapeutic trials of ice chips with Min overt s/sx aspiration to determine readiness or necessecity for another instrumental swallow evaluation. SLP Short Term Goal 3 (Week 2): Pt will increase vocal intensity and overarticulate to achieve speech intelligibility at the word level with Min assist verbal cues. SLP Short Term Goal 4 (Week 2): Pt will complete mildly complex tasks with Supervision assist verbal cues for functional problem solving. SLP Short Term Goal 5 (Week 2): Pt will use external aids for recall of daily information with Min assist verbal and visual cues.  Skilled Therapeutic Interventions: Pt was seen for skilled ST targeting dysphagia and speech goals. SLP facilitated session with overall Min A verbal cues for use of increased vocal intensity and overarticulation ("talking big") during a phrase level barrier speech task. He was ~75% intelligible during barrier task, intelligibility increased to ~80% in conversation in which SLP knew the context. Pt also received phone call from his sister during session and SLP noted 2 instances in which sister had to ask pt to repeat himself for clarification. SLP further facilitated session with therapeutic trials of ice chips to work toward liquid advancement. Pt had completed oral care ~25 minutes prior (and no additional PO intake occurred after oral care), therefore oral care was not completed again. Pt exhibited immediate or delayed throat  clearing in ~50% of ice chip trials. Vocal quality was wet throughout session and therefore not definitively tied to intake. Pt left laying in bed with alarm set and all needs within reach. Continue per current plan of care.       Pain Pain Assessment Pain Scale: 0-10 Pain Score: 0-No pain  Therapy/Group: Individual Therapy  Arbutus Leas 09/16/2019, 2:33 PM

## 2019-09-16 NOTE — Progress Notes (Signed)
Occupational Therapy Weekly Progress Note  Patient Details  Name: Arthur [name] MRN: [id number] Date of Birth: 1981-08-19  Beginning of progress report period: 09/08/2019 End of progress report period: 09/17/2019  Patient has met 2 of 3 short term goals.    Pt is making steady progress at time of report. At time of evaluation he required Mod A for ambulatory bathroom transfers using device, whereas now he requires Min A to complete stated transfers. At time of eval he also required Mod A for dynamic standing balance, progressing to now requiring Min-Mod A for balance. He continues to exhibit posterior lean and trunk sway in standing. He has also progressed with swallowing abilities and therefore an eating goal has been added to his OT POC.   Patient continues to demonstrate the following deficits: muscle weakness, decreased cardiorespiratoy endurance, unbalanced muscle activation, ataxia and decreased coordination, Rt eye blindness, decreased problem solving, decreased safety awareness and delayed processing and decreased sitting balance, decreased standing balance, decreased postural control, hemiplegia and decreased balance strategies and therefore will continue to benefit from skilled OT intervention to enhance overall performance with BADL.  Patient progressing toward long term goals..  Continue plan of care.  OT Short Term Goals Week 1:  OT Short Term Goal 1 (Week 1): Pt will don footwear with Mod A OT Short Term Goal 1 - Progress (Week 1): Progressing toward goal OT Short Term Goal 2 (Week 1): Pt will complete toilet transfer with Min A using LRAD OT Short Term Goal 2 - Progress (Week 1): Met OT Short Term Goal 3 (Week 1): Pt will complete 1 grooming task while standing at the sink to improve balance OT Short Term Goal 3 - Progress (Week 1): Met Week 2:  OT Short Term Goal 1 (Week 2): Pt will don footwear with Mod A OT Short Term Goal 2 (Week 2): Pt will elevate pants over hips with  no more than Min A for standing balance OT Short Term Goal 3 (Week 2): Pt will complete 3/3 toileting tasks with Min A overall   Therapy Documentation Precautions:  Precautions Precautions: Fall Precaution Comments: PEG, foley, visual field loss Rt eye Restrictions Weight Bearing Restrictions: No Vital Signs: Therapy Vitals Temp: 98.3 F (36.8 C) Pulse Rate: 60 Resp: 15 BP: (!) 94/58 Patient Position (if appropriate): Lying Oxygen Therapy SpO2: 100 % O2 Device: Room Air ADL: ADL Eating: NPO Grooming: Moderate assistance Where Assessed-Grooming: Bed level, Edge of bed Upper Body Bathing: Supervision/safety Where Assessed-Upper Body Bathing: Edge of bed Lower Body Bathing: Moderate assistance Where Assessed-Lower Body Bathing: Edge of bed Upper Body Dressing: Minimal assistance Where Assessed-Upper Body Dressing: Edge of bed Lower Body Dressing: Maximal assistance Where Assessed-Lower Body Dressing: Edge of bed Toileting: Not assessed Toilet Transfer: Moderate assistance Toilet Transfer Method: Stand pivot Toilet Transfer Equipment: Engineer, technical sales Transfer: Not assessed      Therapy/Group: Individual Therapy  Demitrus Francisco A Gunnison Chahal 09/16/2019, 7:28 AM

## 2019-09-16 NOTE — Plan of Care (Signed)
  Problem: Consults Goal: RH GENERAL PATIENT EDUCATION Description: See Patient Education module for education specifics. Outcome: Progressing   Problem: RH BOWEL ELIMINATION Goal: RH STG MANAGE BOWEL WITH ASSISTANCE Description: STG Manage Bowel with min Assistance. Outcome: Progressing   Problem: RH BLADDER ELIMINATION Goal: RH STG MANAGE BLADDER WITH ASSISTANCE Description: STG Manage Bladder With min Assistance Outcome: Progressing   Problem: RH SKIN INTEGRITY Goal: RH STG SKIN FREE OF INFECTION/BREAKDOWN Description: No new skin breakdown Outcome: Progressing Goal: RH STG MAINTAIN SKIN INTEGRITY WITH ASSISTANCE Description: STG Maintain Skin Integrity With min Assistance. Outcome: Progressing Goal: RH STG ABLE TO PERFORM INCISION/WOUND CARE W/ASSISTANCE Description: STG Able To Perform Incision/Wound Care With min Assistance. Outcome: Progressing   Problem: RH SAFETY Goal: RH STG ADHERE TO SAFETY PRECAUTIONS W/ASSISTANCE/DEVICE Description: STG Adhere to Safety Precautions With min Assistance/Device. Outcome: Progressing   Problem: RH KNOWLEDGE DEFICIT GENERAL Goal: RH STG INCREASE KNOWLEDGE OF SELF CARE AFTER HOSPITALIZATION Outcome: Progressing   

## 2019-09-16 NOTE — Progress Notes (Signed)
Physical Therapy Session Note  Patient Details  Name: Arthur [name] MRN: [id number] Date of Birth: [birth year]  Today's Date: 09/16/2019 PT Individual Time: 0930-1000 PT Individual Time Calculation (min): 30 min   Short Term Goals: Week 1:  PT Short Term Goal 1 (Week 1): Pt will ambulate 100 ft with LRAD and min assist PT Short Term Goal 2 (Week 1): Pt will ascend/descend 8 steps with rails and min assist PT Short Term Goal 3 (Week 1): Pt will perform sit<>stand with CGA  Skilled Therapeutic Interventions/Progress Updates:   Session focused on NMR to address postural control, motor control, coordination, and balance during functional transfers, dynamic standing balance activities, and gait with RW (during TUG assessment). Pt performed bed mobility at overall supervision level for safety due to impaired balance. Performed stand pivot transfers with overall min assist due to impaired balance, coordination of foot placement, and safe hand placement/technique throughout session. Dynamic standing balance activity to reach for horsehoes placed at various heights and across midline of body with overall min assist and tendency for posterior bias x 2 reps. Administered TUG with RW with average of 3 trials = 50 sec indicating fall risk (trial 1 = 53 sec, 2 = 51 sec, and #3 = 46 sec) requiring min assist during straight line gait with mod assist for turning with noted ataxia, poor trunk control and balance reactions, and decreased safe placement of RW to avoid obstacles. Pt able to propel w/c to and from therapy session with overall supervision using combination of BUE and BLE with focus on coordination and overall functional mobility. Handoff to ST at end of session.   Therapy Documentation Precautions:  Precautions Precautions: Fall Precaution Comments: PEG, foley, visual field loss Rt eye Restrictions Weight Bearing Restrictions: No  Pain: Reports some discomfort at PEG site but no intervention  needed.     Therapy/Group: Individual Therapy  Canary Brim Ivory Broad, PT, DPT, CBIS  09/16/2019, 10:12 AM

## 2019-09-16 NOTE — Progress Notes (Signed)
Speech Language Pathology Weekly Progress and Session Note  Patient Details  Name: Arthur [name] MRN: [id number] Date of Birth: 1980/10/18  Beginning of progress report period: September 08, 2019 End of progress report period: September 16, 2019  Today's Date: 09/16/2019 SLP Individual Time: 9983-3825 SLP Individual Time Calculation (min): 60 min  Short Term Goals: Week 1: SLP Short Term Goal 1 (Week 1): Pt will consume therapeutic trials of ice chips with minimal overt s/s of aspiration and min cues for use of swallowing precautions over 3 consecutive sessions prior to repeat instrumental assessment. SLP Short Term Goal 1 - Progress (Week 1): Met SLP Short Term Goal 2 (Week 1): Pt will use a volitional cough or throat clear to clear his secretions and vocal wetness with mod verbal cues in 75% of opportunities. SLP Short Term Goal 2 - Progress (Week 1): Progressing toward goal SLP Short Term Goal 3 (Week 1): Pt will increase vocal intensity and overarticulate to achieve speech intelligibility at the word level with mod assist verbal cues. SLP Short Term Goal 3 - Progress (Week 1): Met SLP Short Term Goal 4 (Week 1): Pt will use external aids for recall of daily information with mod assist verbal and visual cues. SLP Short Term Goal 4 - Progress (Week 1): Met SLP Short Term Goal 5 (Week 1): Pt will complete mildly complex tasks with min assist verbal cues for functional problem solving. SLP Short Term Goal 5 - Progress (Week 1): Met    New Short Term Goals: Week 2: SLP Short Term Goal 1 (Week 2): Pt will consume current Dys 2/nectar diet with Min overt s/sx aspiration and efficient mastication and oral clearance with Mod A verbal cues for use of swallow strategies. SLP Short Term Goal 2 (Week 2): Pt will consume therapeutic trials of ice chips with Min overt s/sx aspiration to determine readiness or necessecity for another instrumental swallow evaluation. SLP Short Term Goal 3 (Week 2): Pt  will increase vocal intensity and overarticulate to achieve speech intelligibility at the word level with Min assist verbal cues. SLP Short Term Goal 4 (Week 2): Pt will complete mildly complex tasks with Supervision assist verbal cues for functional problem solving. SLP Short Term Goal 5 (Week 2): Pt will use external aids for recall of daily information with Min assist verbal and visual cues.  Weekly Progress Updates: Pt has made functional gains and met 4 out of 5 short term goals this reporting period. Pt is currently Min-Mod assist due to cognitive impairments, oropharyngeal dysphagia, and dysarthria. Pt is consuming Dys 2 (minced/ground) diet with nectar thick liquids. Pt has demonstrated improved oropharyngeal swallow function (evidenced by recent MBSS) which resulted in initiation of current PO diet as well as improved problem solving, short term recall skills, and use of speech intelligibility strategies (specifically overarticulation and increased vocal intensity). Pt education is ongoing. Pt would continue to benefit from skilled ST while inpatient in order to maximize functional independence and reduce burden of care prior to discharge. Anticipate that pt will need 24/7 supervision at discharge in addition to [place] follow up at next level of care.     Intensity: Minumum of 1-2 x/day, 30 to 90 minutes Frequency: 3 to 5 out of 7 days Duration/Length of Stay: 09/27/19 Treatment/Interventions: Cognitive remediation/compensation;Dysphagia/aspiration precaution training;Internal/external aids;Cueing hierarchy;Environmental controls;Functional tasks;Patient/family education;Speech/Language facilitation   Daily Session  Skilled Therapeutic Interventions: Pt was seen for skilled ST targeting dysphagia and cognitive goals. Pt completed oral care with suction with 1 verbal  cue to brush tongue (in addition to teeth). Pt subsequently consumed 4oz of nectar thick juice with immediate cough X1 and immediate  throat clear X1. Pt also exhibited wet vocal quality both at baseline and during intake, therefore SLP unable to determine if vocal quality was tied to PO intake or not - suspect it is due to poor secretion management, as pt required Mod A verbal cues for use of cough/throat clear and suction to manage pooling saliva. Min anterior loss of Dys 2 solids also noted during intake, however efficient mastication and oral clearance achieved with Supervision A verbal cues for use of swallow strategies. Min A verbal cues required for slow rate during nectar intake. SLP further facilitated session with Mod A verbal cues and a visual aid for recall of instructions during a novel basic card task (11 up). Pt identified 2 cards to equal 11 with overall Min A for problem solving and error awareness. Pt expressed need to void, therefore SLP and NT transferred pt to toilet. Mod A verbal cues were required for safety awareness during transfer (need to lock breaks, hold on to rail in toilet, etc.). Pt was left sitting in wheelchair with NT still present to assist with transferring pt back to bed. Continue per current plan of care.            Pain Pain Assessment Pain Scale: 0-10 Pain Score: 0-No pain  Therapy/Group: Individual Therapy  Arbutus Leas 09/16/2019, 9:50 AM

## 2019-09-16 NOTE — Progress Notes (Signed)
Physical Therapy Weekly Progress Note  Patient Details  Name: Arthur [name] MRN: [id number] Date of Birth: 1981/07/17  Beginning of progress report period: September 08, 2019 End of progress report period: September 16, 2019  Today's Date: 09/16/2019 PT Individual Time: 4825-0037 PT Individual Time Calculation (min): 45 min   Patient has met 1 of 3 short term goals. Pt demonstrates improvements with seated trunk control and balance. Pt currently requires supervision for bed mobility with HOB elevated and use of bed rails. Pt transfers stand<>pivot without AD min A for posterior loss of balance. Pt able to perform sit<>stand with RW CGA. Pt currently ambulating 39f with RW mod A. Pt demonstrates bilateral LE and truncal ataxia and increased scissoring with fatigue during gait. Pt requires mod verbal and visual cues for step width.   Patient continues to demonstrate the following deficits muscle weakness, impaired timing and sequencing, abnormal tone, unbalanced muscle activation, ataxia, decreased coordination and decreased motor planning and decreased sitting balance, decreased standing balance, decreased postural control and decreased balance strategies and therefore will continue to benefit from skilled PT intervention to increase functional independence with mobility.  Patient progressing toward long term goals..  Continue plan of care.  PT Short Term Goals Week 1:  PT Short Term Goal 1 (Week 1): Pt will ambulate 100 ft with LRAD and min assist PT Short Term Goal 1 - Progress (Week 1): Progressing toward goal PT Short Term Goal 2 (Week 1): Pt will ascend/descend 8 steps with rails and min assist PT Short Term Goal 2 - Progress (Week 1): Progressing toward goal PT Short Term Goal 3 (Week 1): Pt will perform sit<>stand with CGA PT Short Term Goal 3 - Progress (Week 1): Met Week 2:  PT Short Term Goal 1 (Week 2): Pt will ambulate 100 ft with LRAD and min assist PT Short Term Goal 2 (Week  2): Pt will ascend/descend 8 steps with rails and min assist PT Short Term Goal 3 (Week 2): Pt will perform car transfer with LRAD CGA  Skilled Therapeutic Interventions/Progress Updates:  Ambulation/gait training;Community reintegration;DME/adaptive equipment instruction;Neuromuscular re-education;Stair training;UE/LE Strength taining/ROM;Psychosocial support;Therapeutic Activities;Functional electrical stimulation;Discharge planning;Balance/vestibular training;UE/LE Coordination activities;Therapeutic Exercise;Patient/family education;Functional mobility training;Cognitive remediation/compensation;Splinting/orthotics;Visual/perceptual remediation/compensation;Wheelchair propulsion/positioning;Skin care/wound management;Pain management;Disease management/prevention   Today's Interventions: Received pt semi reclined in bed eating breakfast with NT, pt agreeable to therapy, and denied any pain during session. Therapist took over with supervision while pt finished breakfast. Pt required mod verbal cues to take smaller bites, chew food completely, and swallow. While pt continues breakfast therapist discussed home setup, discharge planning, caregiver assistance, and goals prior to leaving hospital. Limited time due to MD present for assessment and pt's mother and sister calling for check up. BP in supine 93/67 and HR 70 bpm, but pt asymptomatic. Pt transferred supine<>sitting EOB with HOB elevated and use of bed rails with supervision. Pt worked on seated balance while speaking on telephone with supervision/CGA. Pt donned pants mod A and transferred sit<>stand with RW CGA to pull pants over hips with mod A. Pt performed stand<>pivot from bed<>WC without AD min A for posterior loss of balance. [facility]except required min A for toothpaste management). Pt required max verbal cues to swish water in mouth but not to swallow. However pt accidentally  swallowed some water as evidence by increased coughing. Pt educated on safe techniques when brushing teeth to avoid swallowing. Pt verbalized understanding. Pt performed sit<>stand x5 reps with RW  CGA. Pt with poor eccentric control requiring min verbal cues to correct. Concluded session with pt sitting in WC, needs within reach, and chair pad alarm on.   Therapy Documentation Precautions:  Precautions Precautions: Fall Precaution Comments: PEG, foley, visual field loss Rt eye Restrictions Weight Bearing Restrictions: No  Therapy/Group: Individual Therapy Alfonse Alpers PT, DPT   09/16/2019, 7:35 AM

## 2019-09-16 NOTE — Progress Notes (Signed)
Occupational Therapy Session Note  Patient Details  Name: Arthur [name] MRN: [id number] Date of Birth: June 07, 1981  Today's Date: 09/16/2019 OT Individual Time: 1300-1340 OT Individual Time Calculation (min): 40 min    Short Term Goals: Week 1:  OT Short Term Goal 1 (Week 1): Pt will don footwear with Mod A OT Short Term Goal 1 - Progress (Week 1): Progressing toward goal OT Short Term Goal 2 (Week 1): Pt will complete toilet transfer with Min A using LRAD OT Short Term Goal 2 - Progress (Week 1): Met OT Short Term Goal 3 (Week 1): Pt will complete 1 grooming task while standing at the sink to improve balance OT Short Term Goal 3 - Progress (Week 1): Met  Skilled Therapeutic Interventions/Progress Updates:    Treatment session with focus on functional mobility, dynamic sitting and standing balance, and awareness of swallow strategies during self-feeding and grooming tasks.  Pt received upright in chair position in bed finishing lunch.  Pt demonstrating good awareness of swallow strategies with some spillage.  Pt completed bed mobility with CGA and required CGA to min assist for sitting balance while attempting to don shoes.  Pt initially attempted to don by leaning forward and then attempted figure 4.  Pt ultimately required assistance to get foot fully in shoe and then assist to fasten shoes.  Pt ambulated to sink with RW with Min assist.  Engaged in oral care in standing with Min assist and tactile cues to facilitate upright standing posture/balance.  Pt with posterior lean, requiring tactile input at hips and upper torso.  Educated on focus on trunk control in sitting and standing, as pt stating he felt that he was "about to fall over" frequently during session.  Returned to bed and left semi-reclined with all needs in reach.  Therapy Documentation Precautions:  Precautions Precautions: Fall Precaution Comments: PEG, foley, visual field loss Rt eye Restrictions Weight Bearing  Restrictions: No General:   Vital Signs: Therapy Vitals Temp: 97.9 F (36.6 C) Temp Source: Oral Pulse Rate: 65 Resp: 18 BP: 95/62 Patient Position (if appropriate): Lying Oxygen Therapy SpO2: 99 % O2 Device: Room Air Pain: Pain Assessment Pain Scale: 0-10 Pain Score: 0-No pain   Therapy/Group: Individual Therapy  Simonne Come 09/16/2019, 3:09 PM

## 2019-09-16 NOTE — Progress Notes (Signed)
Nutrition Follow-up  DOCUMENTATION CODES:   Underweight, Severe malnutrition in context of chronic illness  INTERVENTION:   Nocturnal tube feeding via PEG: - Vital 1.5 @ 60 ml/hr to run for 10 hours from 1900 to 0500 - Free water per MD/PA, currently 100 ml q 8 hours  Nocturnal tube feeding regimen and current free water provides 900 kcal, 41 grams of protein, and 758 ml of H2O (41% of kcal needs, 39% of protein needs).  - Continue liquid MVI daily per tube  - Vital Cuisine Shake TID with meals, each supplement provides 520 kcal and 22 grams of protein  NUTRITION DIAGNOSIS:   Severe Malnutrition related to chronic illness (HIV, dysphagia) as evidenced by severe fat depletion, severe muscle depletion, percent weight loss (19.2% weight loss in 3 months).  Ongoing, being addressed via TF, diet advancement, and oral nutrition supplements  GOAL:   Patient will meet greater than or equal to 90% of their needs  Progressing  MONITOR:   PO intake, Supplement acceptance, Diet advancement, Labs, Weight trends, TF tolerance, Skin, I & O's  REASON FOR ASSESSMENT:   Consult Enteral/tube feeding initiation and management  ASSESSMENT:   38 year old male with PMH of HIV, substance abuse, depression, GERD. Pt presented to ED on 07/28/19 with dizziness, weakness, and intermittent headaches. Pt had been noncompliant with his HIV drugs due to lack of insurance/affordibility issues. MRI brain done revealing widespread areas of diffusion abnormal contrast in both cerebral hemispheres predominantly left anterior frontal matter concerning for opportunistic infection as well as premature generalized atrophy. 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 Palliative care consulted to discuss [facility] and mother elected on aggressive treatment.  He has been maintained on tube feeds with  NPO due to severe dysphagia with difficulty handing oral secretions. PEG placed on 12/07. Pt admitted to CIR on 09/07/19.  12/18 - MBSS with diet advanced to Dysphagia 2, nectar-thick liquids  Discussed pt with PA. Will transition to nocturnal TF given excellent PO intake at meals since diet advanced.  Discussed plan with RN. Per RN, pt not having any additional vomiting of TF.  Weight up 3 lbs compared to admit weight. Weight gain is favorable in a pt with severe malnutrition. Will continue to monitor trends.  Meal Completion: 90-100% x last 7 recorded meals  Current TF: Vital 1.5 @ 40 ml/hr (tube feeding can be held for up to 4 hours for therapies), free water 100 ml q 8 hours  Medications reviewed and include: Pepcid, magnesium oxide, liquid MVI, Miralax, senna  Labs reviewed. CBG's: 91-108 x 24 hours  Diet Order:   Diet Order            DIET DYS 2 Room service appropriate? Yes; Fluid consistency: Nectar Thick  Diet effective now              EDUCATION NEEDS:   No education needs have been identified at this time  Skin:  Skin Assessment: Skin Integrity Issues: Stage II: sacrum  Last BM:  09/16/19 large type 6  Height:   Ht Readings from Last 1 Encounters:  07/29/19 5\' 11"  (1.803 m)    Weight:   Wt Readings from Last 1 Encounters:  09/16/19 53 kg    Ideal Body Weight:  78.2 kg  BMI:  Body mass index is 16.3 kg/m.  Estimated Nutritional Needs:   Kcal:  2200-2400  Protein:  105-120 grams  Fluid:  >/=  1.8 L    Gaynell Face, MS, RD, LDN Inpatient Clinical Dietitian Pager: [phone] Weekend/After Hours: [phone]

## 2019-09-16 NOTE — Progress Notes (Signed)
[place] PHYSICAL MEDICINE & REHABILITATION PROGRESS NOTE   Subjective/Complaints:   Asking about feeding tube removal, ate 100% breakfast, pt oriented to person and place but not time (January)  ROS: Denies CP, SOB, N/V/D  Objective:   No results found. No results for input(s): WBC, HGB, HCT, PLT in the last 72 hours. No results for input(s): NA, K, CL, CO2, GLUCOSE, BUN, CREATININE, CALCIUM in the last 72 hours.  Intake/Output Summary (Last 24 hours) at 09/16/2019 0804 Last data filed at 09/15/2019 1851 Gross per 24 hour  Intake 474 ml  Output --  Net 474 ml     Physical Exam: Vital Signs Blood pressure (!) 94/58, pulse 60, temperature 98.3 F (36.8 C), resp. rate 15, weight 53 kg, SpO2 100 %. Constitutional: No distress . Vital signs reviewed.  Cachectic. HENT: Normocephalic.  Atraumatic. Eyes: EOMI. No discharge. Cardiovascular: No JVD. Respiratory: Normal effort.  No stridor. GI: Non-distended.  + PEG. Skin: Warm and dry.  Intact. Psych: Flat. Musc: No edema in extremities.  No tenderness in extremities. Neurological: Alert Dysarthria Motor: Grossly 4/5 throughout, stable  Assessment/Plan: 1. 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  Physiatrist is providing close team supervision and 24 hour management of active medical problems listed below.  Physiatrist and rehab team continue to assess barriers to discharge/monitor patient progress toward functional and medical goals  Care Tool:  Bathing  Bathing activity did not occur: Refused Body parts bathed by patient: Right arm, Left arm, Chest, Abdomen, Front perineal area, Right upper leg, Left upper leg, Face   Body parts bathed by helper: Buttocks, Right lower leg, Left lower leg     Bathing assist Assist Level: Moderate Assistance - Patient 50 - 74%     Upper Body Dressing/Undressing Upper  body dressing   What is the patient wearing?: Hospital gown only    Upper body assist Assist Level: Moderate Assistance - Patient 50 - 74%    Lower Body Dressing/Undressing Lower body dressing      What is the patient wearing?: Incontinence brief     Lower body assist Assist for lower body dressing: Moderate Assistance - Patient 50 - 74%     Toileting Toileting Toileting Activity did not occur Press photographer and hygiene only): Refused  Toileting assist Assist for toileting: Moderate Assistance - Patient 50 - 74% Assistive Device Comment: urinal   Transfers Chair/bed transfer  Transfers assist     Chair/bed transfer assist level: Minimal Assistance - Patient > 75%     Locomotion Ambulation   Ambulation assist      Assist level: Minimal Assistance - Patient > 75% Assistive device: Walker-rolling Max distance: 28ft   Walk 10 feet activity   Assist     Assist level: Minimal Assistance - Patient > 75% Assistive device: Walker-rolling   Walk 50 feet activity   Assist    Assist level: Moderate Assistance - Patient - 50 - 74% Assistive device: Walker-rolling    Walk 150 feet activity   Assist Walk 150 feet activity did not occur: Safety/medical concerns  Assist level: (per report)      Walk 10 feet on uneven surface  activity   Assist Walk 10 feet on uneven surfaces activity did not occur: Safety/medical concerns         Wheelchair     Assist Will patient use wheelchair at discharge?: No      Wheelchair assist  level: Supervision/Verbal cueing Max wheelchair distance: 120ft    Wheelchair 50 feet with 2 turns activity    Assist        Assist Level: Supervision/Verbal cueing   Wheelchair 150 feet activity     Assist          Blood pressure (!) 94/58, pulse 60, temperature 98.3 F (36.8 C), resp. rate 15, weight 53 kg, SpO2 100 %.  Medical Problem List and Plan: 1.Functional and cognitive  deficitssecondary to CNS toxoplasmosis  Continue CIR- PT, OT  2. Antithrombotics: -DVT/anticoagulation:Pharmaceutical:Lovenox -antiplatelet therapy: N/A 3. Pain Management:Tramadol or Tylenol prn for abdominal pain. 4. Mood:LCSW to follow for evaluation and support -antipsychotic agents: N/A 5. Neuropsych: This patientis not fullycapable of making decisions on hisown behalf. 6. Skin/Wound Care:routine pressure relief measures 7. Fluids/Electrolytes/Nutrition: 8. 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 10. Malnutrition/Severe dysphagia: see above.  Unable to tolerate bolus tube feeds, converted back to continuous.  Plan for trial bolus feeds tomorrow- PEG placed 12/7  D2 Nectars started on 12/19  Formula changed to nondairy on 12/17 with improvement 11. Hypomagnesemia: IV supplement as needed. added supplement thru peg, increased on 12/18.   Mag 1.7 on 12/18 12.  Hypotension  Soft, but asymptomatic on 12/20  Midodrine increased to 5 TID on 09/11/2019, communicated with pharmacy 13.  Hyponatremia  Sodium 133 on 12/18  Continue to monitor 14.  Leukopenia-secondary to medications  WBCs 2.1 on 09/11/2019,  Continue to monitor 15.  Acute blood loss anemia  Hemoglobin 11.0 on 12/16,  Continue to monitor 16. CBGs due to TF no hx DM   Controlled on 12/20 CBG (last 3)  Recent Labs    09/16/19 0015 09/16/19 0418 09/16/19 0802  GLUCAP 109* 92 99   17.  Slow transit constipation  Bowel meds increased on 12/16  LOS: 9 days A FACE TO [place] E Weber Monnier 09/16/2019, 8:04 AM

## 2019-09-17 ENCOUNTER — Inpatient Hospital Stay (HOSPITAL_COMMUNITY): Payer: Self-pay | Admitting: Occupational Therapy

## 2019-09-17 ENCOUNTER — Inpatient Hospital Stay (HOSPITAL_COMMUNITY): Payer: Self-pay

## 2019-09-17 ENCOUNTER — Inpatient Hospital Stay (HOSPITAL_COMMUNITY): Payer: Self-pay | Admitting: Speech Pathology

## 2019-09-17 LAB — GLUCOSE, CAPILLARY
Glucose-Capillary: 105 mg/dL — ABNORMAL HIGH (ref 70–99)
Glucose-Capillary: 69 mg/dL — ABNORMAL LOW (ref 70–99)
Glucose-Capillary: 69 mg/dL — ABNORMAL LOW (ref 70–99)
Glucose-Capillary: 78 mg/dL (ref 70–99)
Glucose-Capillary: 81 mg/dL (ref 70–99)
Glucose-Capillary: 84 mg/dL (ref 70–99)
Glucose-Capillary: 89 mg/dL (ref 70–99)
Glucose-Capillary: 98 mg/dL (ref 70–99)

## 2019-09-17 MED ORDER — SENNA 8.6 MG PO TABS
2.0000 | ORAL_TABLET | Freq: Two times a day (BID) | ORAL | Status: DC
  Administered 2019-09-17 – 2019-09-27 (×20): 17.2 mg via ORAL
  Filled 2019-09-17 (×20): qty 2

## 2019-09-17 MED ORDER — MIDODRINE HCL 5 MG PO TABS
5.0000 mg | ORAL_TABLET | Freq: Three times a day (TID) | ORAL | Status: DC
  Administered 2019-09-17 – 2019-09-27 (×30): 5 mg via ORAL
  Filled 2019-09-17 (×31): qty 1

## 2019-09-17 MED ORDER — MAGNESIUM OXIDE 400 (241.3 MG) MG PO TABS
400.0000 mg | ORAL_TABLET | Freq: Two times a day (BID) | ORAL | Status: DC
  Administered 2019-09-17 – 2019-09-27 (×20): 400 mg via ORAL
  Filled 2019-09-17 (×20): qty 1

## 2019-09-17 NOTE — Plan of Care (Signed)
  Problem: Consults Goal: RH GENERAL PATIENT EDUCATION Description: See Patient Education module for education specifics. Outcome: Progressing   Problem: RH BOWEL ELIMINATION Goal: RH STG MANAGE BOWEL WITH ASSISTANCE Description: STG Manage Bowel with min Assistance. Outcome: Progressing   Problem: RH BLADDER ELIMINATION Goal: RH STG MANAGE BLADDER WITH ASSISTANCE Description: STG Manage Bladder With min Assistance Outcome: Progressing   Problem: RH SKIN INTEGRITY Goal: RH STG SKIN FREE OF INFECTION/BREAKDOWN Description: No new skin breakdown Outcome: Progressing Goal: RH STG MAINTAIN SKIN INTEGRITY WITH ASSISTANCE Description: STG Maintain Skin Integrity With min Assistance. Outcome: Progressing Goal: RH STG ABLE TO PERFORM INCISION/WOUND CARE W/ASSISTANCE Description: STG Able To Perform Incision/Wound Care With min Assistance. Outcome: Progressing   Problem: RH SAFETY Goal: RH STG ADHERE TO SAFETY PRECAUTIONS W/ASSISTANCE/DEVICE Description: STG Adhere to Safety Precautions With min Assistance/Device. Outcome: Progressing   Problem: RH KNOWLEDGE DEFICIT GENERAL Goal: RH STG INCREASE KNOWLEDGE OF SELF CARE AFTER HOSPITALIZATION Outcome: Progressing   

## 2019-09-17 NOTE — Progress Notes (Signed)
[place] PHYSICAL MEDICINE & REHABILITATION PROGRESS NOTE   Subjective/Complaints: Patient seen sitting up in bed this morning.  He states he slept well overnight.  He states that his stomach hurts with liquids.  ROS: Denies CP, SOB, N/V/D  Objective:   No results found. Recent Labs    09/16/19 1145  WBC 2.4*  HGB 11.0*  HCT 32.5*  PLT 237   Recent Labs    09/16/19 1145  NA 132*  K 4.0  CL 98  CO2 26  GLUCOSE 84  BUN 10  CREATININE 0.76  CALCIUM 9.0    Intake/Output Summary (Last 24 hours) at 09/17/2019 1112 Last data filed at 09/17/2019 1000 Gross per 24 hour  Intake 480 ml  Output 650 ml  Net -170 ml     Physical Exam: Vital Signs Blood pressure (!) 92/50, pulse 67, temperature 97.6 F (36.4 C), temperature source Oral, resp. rate 16, weight 53.4 kg, SpO2 100 %. Constitutional: No distress . Vital signs reviewed.  Cachectic. HENT: Normocephalic.  Atraumatic. Eyes: EOMI. No discharge. Cardiovascular: No JVD. Respiratory: Normal effort.  No stridor. GI: Non-distended.  + PEG Skin: Warm and dry.  Intact. Psych: Normal mood.  Normal behavior. Musc: No edema in extremities.  No tenderness in extremities. Psych: Flat. Musc: No edema in extremities.  No tenderness in extremities. Neurological: Alert Dysarthria Motor: Grossly 4/5 throughout, improving  Assessment/Plan: 1. 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  Physiatrist is providing close team supervision and 24 hour management of active medical problems listed below.  Physiatrist and rehab team continue to assess barriers to discharge/monitor patient progress toward functional and medical goals  Care Tool:  Bathing  Bathing activity did not occur: Refused Body parts bathed by patient: Right arm, Left arm, Chest, Abdomen, Front perineal area, Right upper leg, Left upper leg, Face    Body parts bathed by helper: Buttocks, Right lower leg, Left lower leg     Bathing assist Assist Level: Moderate Assistance - Patient 50 - 74%     Upper Body Dressing/Undressing Upper body dressing   What is the patient wearing?: Hospital gown only    Upper body assist Assist Level: Moderate Assistance - Patient 50 - 74%    Lower Body Dressing/Undressing Lower body dressing      What is the patient wearing?: Pants     Lower body assist Assist for lower body dressing: Moderate Assistance - Patient 50 - 74%     Toileting Toileting Toileting Activity did not occur Landscape architect and hygiene only): Refused  Toileting assist Assist for toileting: Moderate Assistance - Patient 50 - 74% Assistive Device Comment: urinal   Transfers Chair/bed transfer  Transfers assist     Chair/bed transfer assist level: Minimal Assistance - Patient > 75%     Locomotion Ambulation   Ambulation assist      Assist level: Moderate Assistance - Patient 50 - 74% Assistive device: Walker-rolling Max distance: 25'(mod for turns, min assist straight line)   Walk 10 feet activity   Assist     Assist level: Minimal Assistance - Patient > 75% Assistive device: Walker-rolling   Walk 50 feet activity   Assist    Assist level: Moderate Assistance - Patient - 50 - 74% Assistive device: Walker-rolling    Walk 150 feet activity   Assist Walk 150 feet activity did not occur: Safety/medical concerns  Assist level: (per report)  Walk 10 feet on uneven surface  activity   Assist Walk 10 feet on uneven surfaces activity did not occur: Safety/medical concerns         Wheelchair     Assist Will patient use wheelchair at discharge?: No      Wheelchair assist level: Supervision/Verbal cueing Max wheelchair distance: 150'    Wheelchair 50 feet with 2 turns activity    Assist        Assist Level: Supervision/Verbal cueing   Wheelchair 150 feet  activity     Assist      Assist Level: Supervision/Verbal cueing   Blood pressure (!) 92/50, pulse 67, temperature 97.6 F (36.4 C), temperature source Oral, resp. rate 16, weight 53.4 kg, SpO2 100 %.  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 Antithrombotics: -DVT/anticoagulation:Pharmaceutical:Lovenox -antiplatelet therapy: N/A 3. Pain Management:Tramadol or Tylenol prn for abdominal pain. 4. Mood:LCSW to follow for evaluation and support -antipsychotic agents: N/A 5. Neuropsych: This patientis not fullycapable of making decisions on hisown behalf. 6. Skin/Wound Care:routine pressure relief measures 7. Fluids/Electrolytes/Nutrition: 8. 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 10. Malnutrition/Severe dysphagia: see above.  PEG placed on 09/02/2019  Unable to tolerate bolus tube feeds, converted back to continuous.  DC tube feeds  D2 Nectars started on 12/19, good p.o. intake on 12/22 11. Hypomagnesemia: IV supplement as needed. added supplement thru peg, increased on 12/18.   Mag 1.7 on 12/18 12.  Hypotension  Soft, but asymptomatic on 12/22  Midodrine increased to 5 TID on 09/11/2019, communicated with pharmacy 13.  Hyponatremia  Sodium 132 on 12/21  Continue to monitor 14.  Leukopenia-secondary to medications  WBCs 2.4 on 12/21  Continue to monitor 15.  Acute blood loss anemia  Hemoglobin 11.0 on 12/21  Continue to monitor 16. CBGs due to TF no hx DM  CBG (last 3)  Recent Labs    09/17/19 0042 09/17/19 0551 09/17/19 0804  GLUCAP 84 105* 78   Controlled on 12/22 17.  Slow transit constipation  Bowel meds increased on 12/16  LOS: 10 days A FACE TO FACE EVALUATION WAS PERFORMED  Arthur [name]  Arthur [name] 09/17/2019, 11:12 AM

## 2019-09-17 NOTE — Progress Notes (Signed)
Physical Therapy Session Note  Patient Details  Name: Arthur [name] MRN: [id number] Date of Birth: 1981/07/25  Today's Date: 09/17/2019 PT Individual Time: 0800-0915 PT Individual Time Calculation (min): 75 min   Short Term Goals: Week 1:  PT Short Term Goal 1 (Week 1): Pt will ambulate 100 ft with LRAD and min assist PT Short Term Goal 1 - Progress (Week 1): Progressing toward goal PT Short Term Goal 2 (Week 1): Pt will ascend/descend 8 steps with rails and min assist PT Short Term Goal 2 - Progress (Week 1): Progressing toward goal PT Short Term Goal 3 (Week 1): Pt will perform sit<>stand with CGA PT Short Term Goal 3 - Progress (Week 1): Met Week 2:  PT Short Term Goal 1 (Week 2): Pt will ambulate 100 ft with LRAD and min assist PT Short Term Goal 2 (Week 2): Pt will ascend/descend 8 steps with rails and min assist PT Short Term Goal 3 (Week 2): Pt will perform car transfer with LRAD CGA  Skilled Therapeutic Interventions/Progress Updates:   Received pt semi reclined in bed eating breakfast, pt agreeable to therapy, and denied any pain during today's session. PT took over with supervision with eating. Session focused on functional mobility/transfers, ambulation with RW, dynamic standing balance, stair navigation, LE strength, balance/coordination, motor control, and improved endurance with activity. Pt finished eating breakfast with supervision and verbal cues for smaller bites and to fully chew food. Pt performed oral hygiene in supine with swab with min A. Pt donned pants in supine with mod A. Pt performed bed mobility from flat bed with supervision and increased time. Pt donned pull over shirt sitting EOB with mod A. Pt donned shoes in sitting with mod A for time management purposes. Pt transferred stand<>pivot without AD min A x 4 trials throughout session. Pt performed WC mobility 144f with bilateral UE's and LE's and supervision. Pt ambulated 454fwith RW mod A for balance and  posterior lean. Pt demonstrated narrow BOS, scissoring with increased fatigue, and mild ataxia in bilateral LE's. Pt navigated 4 steps with 2 rails mod A ascending with a step through and descending with a step to pattern. Pt performed standing dyanmic balance playing cornhole x2 trials without AD mod A for posterior lean. Pt reported increased fatigue and requested to take rest break lying down. In supine pt performed bridging 2x12 with verbal cues for technique and increased hip extension and bilateral clamshells x12 with verbal cues for technique. Pt transferred supine<>sit with supervision and stand<>pivot to WC without AD min A. Pt extremely fatigued after session requesting to lay down in bed. Concluded session with pt supine in bed, needs within reach, and bed alarm on.   Therapy Documentation Precautions:  Precautions Precautions: Fall Precaution Comments: PEG, foley, visual field loss Rt eye Restrictions Weight Bearing Restrictions: No   Therapy/Group: Individual Therapy AnAlfonse AlpersT, DPT   09/17/2019, 7:31 AM

## 2019-09-17 NOTE — Progress Notes (Signed)
Speech Language Pathology Daily Session Note  Patient Details  Name: Arthur [name] MRN: [id number] Date of Birth: December 05, 1980  Today's Date: 09/17/2019 SLP Individual Time: 1400-1503 SLP Individual Time Calculation (min): 63 min  Short Term Goals: Week 2: SLP Short Term Goal 1 (Week 2): Pt will consume current Dys 2/nectar diet with Min overt s/sx aspiration and efficient mastication and oral clearance with Mod A verbal cues for use of swallow strategies. SLP Short Term Goal 2 (Week 2): Pt will consume therapeutic trials of ice chips with Min overt s/sx aspiration to determine readiness or necessecity for another instrumental swallow evaluation. SLP Short Term Goal 3 (Week 2): Pt will increase vocal intensity and overarticulate to achieve speech intelligibility at the word level with Min assist verbal cues. SLP Short Term Goal 4 (Week 2): Pt will complete mildly complex tasks with Supervision assist verbal cues for functional problem solving. SLP Short Term Goal 5 (Week 2): Pt will use external aids for recall of daily information with Min assist verbal and visual cues.  Skilled Therapeutic Interventions: Pt was seen for skilled ST targeting dysphagia and cognitive goals. Following oral care, pt accepted thin H2O trials via spoon with 1 immediate cough that appeared reflexive and likely tied to intake; otherwise pt's intermittent throat clearing remained consistent with baseline presentation of wet/congested vocal quality and consistent throat clearing. Pt also consumed 4 oz nectar juice via spoon with no overt s/sx aspiration that appeared linked to PO intake. Pt did not recall reasoning for thickened liquid recommendation, requiring Total A verbal explanation of swallow anatomy and results of previous MBSS to rationalize nectar recommendation. Also discussed likely potential for repeat MBSS prior to discharge home. SLP further facilitated session with Supervision A for problem solving and error  awareness, Min A for recall of instructions during a novel semi-complex card task (blink). In functional conversation regarding medications, pt required Total A to recall 5/5 medication names and functions. During transfers to and from bed-toilet, pt continues to require Min-Mod A verbal cues for safety awareness. Pt left laying in bed with alarm set and needs within reach. Continue per current plan of care.        Pain Pain Assessment Pain Scale: 0-10 Pain Score: 0-No pain  Therapy/Group: Individual Therapy  Arbutus Leas 09/17/2019, 3:06 PM

## 2019-09-17 NOTE — Progress Notes (Signed)
Occupational Therapy Session Note  Patient Details  Name: Arthur [name] MRN: [id number] Date of Birth: [birth year]  Today's Date: 09/17/2019 OT Individual Time: 1106-1200 OT Individual Time Calculation (min): 54 min    Short Term Goals: Week 2:  OT Short Term Goal 1 (Week 2): Pt will don footwear with Mod A OT Short Term Goal 2 (Week 2): Pt will elevate pants over hips with no more than Min A for standing balance OT Short Term Goal 3 (Week 2): Pt will complete 3/3 toileting tasks with Min A overall  Skilled Therapeutic Interventions/Progress Updates:    Treatment session with focus on dynamic sitting and standing balance.  Pt received supine in bed reporting that he may have been incontinent of both bowel and bladder.  Pt able to doff pants with bridging without assistance.  Therapist completed hygiene of incontinent bowel and bladder while pt maintained positioning in sideying.  Pt engaged in perineal hygiene.  Pt able to bridge to pull pants up over hips.  Completed bed mobility CGA and stand pivot transfer to w/c Min assist.  Engaged in Christmas ornament craft in standing with focus on maintaining standing balance during functional reaching.  Pt continues to demonstrate posterior posturing in standing, requiring tactile cues and manual facilitation for improved upright posture.  Engaged in [place] [address] in standing with improved standing tolerance, however continues to demonstrate "unsteadiness" in standing requiring min assist.  Pt tolerating standing 2-3 mins at a time before requiring seated rest break.  Returned to room and completed oral care in preparation for lunch.  Pt requested to return to bed at end of session, despite encouragement to remain up for lunch.  Therapy Documentation Precautions:  Precautions Precautions: Fall Precaution Comments: PEG, foley, visual field loss Rt eye Restrictions Weight Bearing Restrictions: No Pain:  Pt with no c/o pain   Therapy/Group:  Individual Therapy  Simonne Come 09/17/2019, 12:20 PM

## 2019-09-18 ENCOUNTER — Inpatient Hospital Stay (HOSPITAL_COMMUNITY): Payer: Self-pay | Admitting: Physical Therapy

## 2019-09-18 ENCOUNTER — Inpatient Hospital Stay (HOSPITAL_COMMUNITY): Payer: Self-pay | Admitting: Speech Pathology

## 2019-09-18 ENCOUNTER — Encounter (HOSPITAL_COMMUNITY): Payer: Self-pay | Admitting: Psychology

## 2019-09-18 ENCOUNTER — Inpatient Hospital Stay (HOSPITAL_COMMUNITY): Payer: Self-pay

## 2019-09-18 DIAGNOSIS — F0280 Dementia in other diseases classified elsewhere without behavioral disturbance: Secondary | ICD-10-CM

## 2019-09-18 DIAGNOSIS — A523 Neurosyphilis, unspecified: Secondary | ICD-10-CM

## 2019-09-18 DIAGNOSIS — B20 Human immunodeficiency virus [HIV] disease: Secondary | ICD-10-CM

## 2019-09-18 LAB — GLUCOSE, CAPILLARY
Glucose-Capillary: 100 mg/dL — ABNORMAL HIGH (ref 70–99)
Glucose-Capillary: 76 mg/dL (ref 70–99)
Glucose-Capillary: 82 mg/dL (ref 70–99)
Glucose-Capillary: 83 mg/dL (ref 70–99)
Glucose-Capillary: 94 mg/dL (ref 70–99)

## 2019-09-18 NOTE — Consult Note (Signed)
Neuropsychological Consultation   Patient:   Arthur [name]   DOB:   Aug 15, 1981  MR Number:  [id number]  Location:  [place] [facility] [place] [id number] [place] [address] Dept: [place]: [phone]           Date of Service:   09/18/2019  Start Time:   9 AM End Time:   10 AM  Provider/Observer:  Ilean Skill, Psy.D.       Clinical Neuropsychologist       Billing Code/Service: 69485  Chief Complaint:    Arthur [name] is a 38 year old male with history of HIV, substance abuse, depression and anxiety, and GERD.  The patient presented to the ED on 07/28/2019 with dizziness, weakness and intermittent headaches.  The patient had been noncompliant with his HIV drugs due to inability to afford medications and lack of insurance.  MRI revealed widespread areas of diffuse abnormal contrast in both cerebral hemispheres predominantly left anterior frontal regions that were concerning for opportunistic infection causing acute changes.  The patient also had indications of premature generalized atrophy.  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  The patient was started on medications and he has been showing slow response to treatment.  Other infections have also been treated as part of his HIV status/significantly impaired immune system.  While palliative care was consulted the patient's family elected for aggressive treatment.  The patient has a history of depression anxiety, polysubstance abuse.  Reason for Service:  Patient was referred for neuropsychological consultation due to coping and adjustment issues.  Below is the HPI for the current mission.  [name] is a 37 year old male with history of HIV, substance abuse, depression, GERD who presented to ED on 07/28/19 with dizziness, weakness and  intermittent headaches. Patient had been noncompliant with his HIV drugs due to lack of insurance/affordibility issues. 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 Dr. 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 Esophageal candidiasis treated with IV fluconazole and ART added 11/30 without signs of IRIS. Palliative care consulted to discuss goals of care and mother elected on aggressive treatment. He has been maintained on tube feeds with NPO due to severe dysphagia with difficulty handing oral secretions. PEG recommended for nutritional support and as motherwasagreeable,this was placed on 12/07by Dr. Earleen Newport. 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 Encephalopathy with confusion resolving and he is showing improvement in activity tolerance. CIR recommended due to functional deficits.   Current Status:  The patient is clearly having continued cognitive deficits.  The patient's vision remains impaired in his ability to maintain attention and concentration were impaired today.  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  Behavioral Observation: MIKLOS BIDINGER  presents as a 38 y.o.-year-old Right African American Male who appeared his stated age. his dress was Appropriate and he was Well Groomed and his manners were Appropriate to the situation.  his participation was indicative of Appropriate and Inattentive behaviors.  There were any physical disabilities noted.  he displayed an appropriate level of cooperation and motivation.     Interactions:    Active Appropriate and Inattentive  Attention:   abnormal and attention span appeared shorter than expected for age  Memory:   abnormal; global memory impairment noted  Visuo-spatial:  not examined  Speech (Volume):  low  Speech:   slurred; garbled  Thought Process:  Circumstantial and Tangential  Though Content:  WNL; not suicidal and not homicidal  Orientation:   person  Judgment:   Poor  Planning:   Poor  Affect:    Blunted, Depressed and Lethargic  Mood:    Dysphoric  Insight:   Shallow  Intelligence:   low    Medical History:   Past Medical History:  Diagnosis Date  . Anemia   . Anxiety   . Depression   . GERD (gastroesophageal reflux disease)   . HIV infection (HCC)   . Substance abuse (HCC)      Psychiatric History:  Longstanding history of depression anxiety and polysubstance abuse.  Family Med/Psych History:  Family History  Problem Relation Age of Onset  . Diabetes Neg Hx   . CAD Neg Hx   . Hypertension Neg Hx     Risk of Suicide/Violence: low patient denies any suicidal homicidal ideation.  Impression/DX:  Arthur [name] is a 38 year old male with history of HIV, substance abuse, depression and anxiety, and GERD.  The patient presented to the ED on 07/28/2019 with dizziness, weakness and intermittent headaches.  The patient  had been noncompliant with his HIV drugs due to inability to afford medications and lack of insurance.  MRI revealed widespread areas of diffuse abnormal contrast in both cerebral hemispheres predominantly left anterior frontal regions that were concerning for opportunistic infection causing acute changes.  The patient also had indications of premature generalized atrophy.  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  The patient was started on medications and he has been showing slow response to treatment.  Other infections have also been treated as part of his HIV status/significantly impaired immune system.  While palliative care was consulted the patient's family elected for aggressive treatment.  The patient has a history of depression anxiety, polysubstance abuse.  The patient is clearly having continued cognitive deficits.  The patient's vision remains impaired in his ability to maintain attention and concentration were impaired today.  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         Electronically Signed   _______________________ Arley Phenix, Psy.D.

## 2019-09-18 NOTE — Progress Notes (Signed)
Occupational Therapy Session Note  Patient Details  Name: Arthur [name] MRN: [id number] Date of Birth: 11-17-1980  Today's Date: 09/18/2019 OT Individual Time: 1000-1055 OT Individual Time Calculation (min): 55 min    Short Term Goals: Week 2:  OT Short Term Goal 1 (Week 2): Pt will don footwear with Mod A OT Short Term Goal 2 (Week 2): Pt will elevate pants over hips with no more than Min A for standing balance OT Short Term Goal 3 (Week 2): Pt will complete 3/3 toileting tasks with Min A overall  Skilled Therapeutic Interventions/Progress Updates:    Pt resting in bed upon arrival and agreeable to getting OOB. Pt sat EOB with min A and donned footwear while sitting EOB with min A. Pt transferred to w/c with min A. OT intervention with table and standing tasks-pipe tree and colored peg board. Pt completed two patterns with each task with min questioning cues to correct errors.  Pt able to correct errors when questioned. Pt also engaged in [employer] seated in w/c 3x15.  Pt propelled w/c back to room (50%). Pt transferred back to bed and removed shoes.  Pt commented that he was "really tired" after therapy.  Pt repositioned in bed without assistance.  Pt remained in bed with all needs within reach and bed alarm activated.   Therapy Documentation Precautions:  Precautions Precautions: Fall Precaution Comments: PEG, foley, visual field loss Rt eye Restrictions Weight Bearing Restrictions: No  Pain: Pain Assessment Pain Scale: 0-10 Pain Score: 0-No pain   Therapy/Group: Individual Therapy  Leroy Libman 09/18/2019, 10:59 AM

## 2019-09-18 NOTE — Progress Notes (Signed)
Social Work Patient ID: Arthur [name], male   DOB: [birth year], 38 y.o.   MRN: [id number]  Met with pt and spoke with Mom via telephone to inform of team conference progress in his therapies and discharge still 1/1. Discussed having caregiver come in for education next week. She will plan to come in Wed @ 2:00 and stay and learn his care and then show his sister who will be a caregiver also. Will work on discharge needs-ie equipment and home health services, along with PCP at discharge.

## 2019-09-18 NOTE — Progress Notes (Signed)
Physical Therapy Session Note  Patient Details  Name: Arthur [name] MRN: [id number] Date of Birth: Sep 19, 1981  Today's Date: 09/18/2019 PT Individual Time: 4098-1191 PT Individual Time Calculation (min): 53 min   Short Term Goals: Week 1:  PT Short Term Goal 1 (Week 1): Pt will ambulate 100 ft with LRAD and min assist PT Short Term Goal 1 - Progress (Week 1): Progressing toward goal PT Short Term Goal 2 (Week 1): Pt will ascend/descend 8 steps with rails and min assist PT Short Term Goal 2 - Progress (Week 1): Progressing toward goal PT Short Term Goal 3 (Week 1): Pt will perform sit<>stand with CGA PT Short Term Goal 3 - Progress (Week 1): Met Week 2:  PT Short Term Goal 1 (Week 2): Pt will ambulate 100 ft with LRAD and min assist PT Short Term Goal 2 (Week 2): Pt will ascend/descend 8 steps with rails and min assist PT Short Term Goal 3 (Week 2): Pt will perform car transfer with LRAD CGA  Skilled Therapeutic Interventions/Progress Updates:   Received pt supine in bed, pt agreeable to threapy, and denied any pain during today's session. Pt reported fatigue from previous therapy sessions today and wasn't as talkative this afternoon. Session focused on functional mobility/transfers, LE strength, balance/coordination, simulated car transfer, and improved endurance with activity. Pt performed bed mobility with supervision and increased time. PT donned shoes max A for time management. Pt transferred stand<>pivot bed<>WC min A without RW.  Pt performed WC mobility 140f with bilateral LE's and UE and supervision. Pt transferred stand<>pivot to Nustep without RW min A. Pt performed UE and LE strengthening on NuStep at workload 4 for 6 minutes for total of 167 steps. Pt required multiple rest breaks throughout session. Pt performed simulated car transfer without RW min A. Pt reported increased fatigue and declined ambulation during today's session. Pt required verbal cues for safety and technique.  In supine pt performed bilateral SLR x 8 reps. Pt required verbal cues for technique and reported fatigue after activity. Pt performed 2x8 bridges with verbal cues for hip extension and glute activation. Pt performed bilateral sidelying clamshells 2x8 with cues for technique. Pt performed bilateral sidelying hip flexion 2x8 reps. [facility] Pt transferred stand<>pivot WC<>bed min A without RW and sit<>supine with supervision. Concluded session with pt supine in bed, needs within reach, and bed alarm on.   Therapy Documentation Precautions:  Precautions Precautions: Fall Precaution Comments: PEG, foley, visual field loss Rt eye Restrictions Weight Bearing Restrictions: No   Therapy/Group: Individual Therapy AAlfonse AlpersPT, DPT   09/18/2019, 7:40 AM

## 2019-09-18 NOTE — Patient Care Conference (Signed)
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 S/P percutaneous endoscopic gastrostomy (PEG) tube placement ([place])   . Arterial hypotension   . Slow transit constipation   . Labile blood glucose   . Hypomagnesemia   . Acute blood loss anemia   . Leucopenia   . Labile blood pressure   . HIV disease ([place])   . Debility 09/07/2019  . Aspiration into airway   . Palliative care by specialist   . DNR (do not resuscitate) discussion   . Weakness generalized   . Dysphagia   . Protein-calorie malnutrition, severe 08/19/2019  . 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 Goals of care, counseling/discussion   . Bacterial meningitis   . Thrush of mouth and esophagus ([place])   . Palliative care encounter   . HIV (human immunodeficiency virus infection) ([place]) 07/29/2019  . Vision loss 07/29/2019  . Abnormal brain MRI 07/29/2019  . Acquired immunodeficiency syndrome ([place]) 07/28/2019  . Bradycardia 07/28/2019  . Hyponatremia 07/28/2019  . Nausea and vomiting 03/25/2013  . Muscle spasm 03/25/2013  . Drug use 01/11/2012  . 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 COUGH 06/23/2009  . CHEST PAIN, PLEURITIC 06/23/2009  . FUNGAL DERMATITIS 05/28/2008  . GERD 05/28/2008  . DENTAL CARIES 02/13/2008  . NICOTINE ADDICTION 11/14/2007  . BACK STRAIN, LUMBAR 08/22/2007  . 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 WEIGHT LOSS, RECENT 05/01/2007  . ANXIETY DEPRESSION 02/14/2007  . HIV DISEASE 02/13/2007  . CERVICAL LYMPHADENOPATHY, ANTERIOR, RIGHT 02/13/2007    Expected Discharge Date: Expected Discharge Date: 09/27/19  Team Members  Present: Physician leading conference: Dr. Delice Lesch Social Worker Present: Ovidio Kin, LCSW Nurse Present: Other (comment)(Blair Montanti-LPN) Case manager: Karene Fry, RN PT Present: Becky Sax, PT OT Present: Roanna Epley, COTA SLP Present: Jettie Booze, CF-SLP PPS Coordinator present : Gunnar Fusi, SLP     Current Status/Progress Goal Weekly Team Focus  Bowel/Bladder   Incontinent of bowel and bladder.LBM 12/21  attempt to train bladder/bowel  assess toileting needs qshift and PRN   Swallow/Nutrition/ Hydration   Mod A secretion management, Dys2/nectar Mod A swallow strategies, continuing therapeutic ice chip trials with SLP  Min A  secretion management, tolerance Dys 2/nectar, assess readiness for advancement/need for repeat MBSS/potential for water protocol   ADL's   Min A stand pivot transfers, Min-Mod A transfers/ambulating with RW, Min A LB dressing, Mod A bathing  Supervision-steady assist overall  ADL retraining, dynamic sitting/standing balance, cognitive remediation, pt education, d/c planning   Mobility   bed mobility supervision, min A stand<>pivot transfer, gait 7ft with RW mod A  supervision  transfers, dynamic standing balance, NMR, ambulation, LE strength, improved endurance   Communication   Min-Mod A increased vocal intensity and overarticulation phrase level  Min A  carryover speech intelligibility strategies   Safety/Cognition/ Behavioral Observations  Min-Mod A  Min-Supervision A  basic and semi-complex problem solving, short term recall, error awareness   Pain   no complaints of pain  remain free of pain  assess pain qshfit PRN   Skin  stage II pressure injury to sacrum covered with foam  prevent further breakdown and remain free of infection  assess skin qshift and PRN      *See Care Plan and progress notes for long and short-term goals.     Barriers to Discharge  Current Status/Progress Possible Resolutions Date Resolved   Nursing                   PT                    OT                  SLP                SW                Discharge Planning/Teaching Needs:  Home with sister and Mom along with sitter to be there. Aware he will require 24hr care and report can provide this. Encouraged by his progress in therapies.      Team Discussion: Eating well, TF stopped, monitoring BP and labs, CBGs stopped.  RN - eating well, improving, weak on transfers, pain with PEG tube, continent, MD says order double portions.  OT min A transfers, S goals.  PT S bed, min a transfers, mod A 50' RW, strong posterior lean, S goals.  Fam ed need to schedule for next Mon/Tues.  SLP mod A manage secretions, min a speech, memory min a, goals S/min A.  SW to call mom for fam ed.   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 Weekly Focus/Goal: Improve mobility, endurance, electrolytes, BP, swallowing, hyponatremia, hypomagnesemia  Barriers to Discharge: Medical stability;Nutrition means;Medication compliance;Wound care   Possible Resolutions to Barriers: Therapies, follow labs, optimize BP meds, continue to advance diet as tolerated, anti-virals   Continued Need for Acute Rehabilitation Level of Care: The patient requires daily medical management by a physician with specialized training in physical medicine and rehabilitation for the following reasons: Direction of a multidisciplinary physical rehabilitation program to maximize functional independence : Yes Medical management of patient stability for increased activity during participation in an intensive rehabilitation regime.: Yes Analysis of laboratory values and/or radiology reports with any subsequent need for medication adjustment and/or medical intervention. : Yes   I attest that I was present, lead the team conference, and concur with the assessment and plan of the team.   Trish Mage 09/18/2019, 6:41 PM  Team conference was held via web/ teleconference due to COVID - 19

## 2019-09-18 NOTE — Progress Notes (Signed)
Physical Therapy Session Note  Patient Details  Name: CORNEY KNIGHTON MRN: [id number] Date of Birth: Oct 31, 1980  Today's Date: 09/18/2019 PT Individual Time: 0802-0845 PT Individual Time Calculation (min): 43 min   Short Term Goals: Week 3:     Skilled Therapeutic Interventions/Progress Updates:  Patient presents supine in bed, finishing breakfast.  Patient performed bed mobility sup to sit w/ supervision.  Patient sat EOB to don pants, shirt.  Patient required min A for donning pants.  Patient had some LOB retropulsing.  Patient stood to pull up pants using RW and min A.  Patient    Performed SPT bed > w/c w/ min A and noted scissoring .  Patient performed multiple trials of standing to perform "Connect 4" game reaching to bilateral sides to challenge balance.  Patient reaching cross mid-line to increase difficulty w/ min A.  Patient performed w/ decreased BOS as well.  Patient amb w/ RW up to 65' w/ min A and noted decreased BOS and some scissoring.  Patient able to correct w/ verbal cues, but fatigues and increases.  Patient returned to bed after therapy w/ all needs close and bed alarm on.     Therapy Documentation Precautions:  Precautions Precautions: Fall Precaution Comments: PEG, foley, visual field loss Rt eye Restrictions Weight Bearing Restrictions: No General:   Vital Signs:  Pain: 0/10 per patient report Pain Assessment Pain Scale: 0-10 Pain Score: 0-No pain       Therapy/Group: Individual Therapy  Ladoris Gene 09/18/2019, 10:45 AM

## 2019-09-18 NOTE — Plan of Care (Signed)
  Problem: Consults Goal: RH GENERAL PATIENT EDUCATION Description: See Patient Education module for education specifics. Outcome: Progressing   Problem: RH BOWEL ELIMINATION Goal: RH STG MANAGE BOWEL WITH ASSISTANCE Description: STG Manage Bowel with min Assistance. Outcome: Progressing   Problem: RH BLADDER ELIMINATION Goal: RH STG MANAGE BLADDER WITH ASSISTANCE Description: STG Manage Bladder With min Assistance Outcome: Progressing   Problem: RH SKIN INTEGRITY Goal: RH STG SKIN FREE OF INFECTION/BREAKDOWN Description: No new skin breakdown Outcome: Progressing Goal: RH STG MAINTAIN SKIN INTEGRITY WITH ASSISTANCE Description: STG Maintain Skin Integrity With min Assistance. Outcome: Progressing Goal: RH STG ABLE TO PERFORM INCISION/WOUND CARE W/ASSISTANCE Description: STG Able To Perform Incision/Wound Care With min Assistance. Outcome: Progressing   Problem: RH SAFETY Goal: RH STG ADHERE TO SAFETY PRECAUTIONS W/ASSISTANCE/DEVICE Description: STG Adhere to Safety Precautions With min Assistance/Device. Outcome: Progressing   Problem: RH KNOWLEDGE DEFICIT GENERAL Goal: RH STG INCREASE KNOWLEDGE OF SELF CARE AFTER HOSPITALIZATION Outcome: Progressing   

## 2019-09-18 NOTE — Progress Notes (Signed)
[place] PHYSICAL MEDICINE & REHABILITATION PROGRESS NOTE   Subjective/Complaints: Patient seen laying in bed this AM.  He indicated he slept well overnight.  He notes improvement in abdominal pain now that tube feeds have been d/ced. Educated on importance of nutrition maintenance.   ROS: Denies CP, SOB, N/V/D  Objective:   No results found. Recent Labs    09/16/19 1145  WBC 2.4*  HGB 11.0*  HCT 32.5*  PLT 237   Recent Labs    09/16/19 1145  NA 132*  K 4.0  CL 98  CO2 26  GLUCOSE 84  BUN 10  CREATININE 0.76  CALCIUM 9.0    Intake/Output Summary (Last 24 hours) at 09/18/2019 0846 Last data filed at 09/18/2019 3474 Gross per 24 hour  Intake -  Output 650 ml  Net -650 ml     Physical Exam: Vital Signs Blood pressure (!) 86/56, pulse 66, temperature 98.2 F (36.8 C), resp. rate 18, weight 53.2 kg, SpO2 100 %. Constitutional: No distress . Vital signs reviewed. Cachectic.  HENT: Normocephalic.  Atraumatic. Eyes: EOMI. No discharge. Cardiovascular: No JVD. Respiratory: Normal effort.  No stridor. GI: Non-distended. Skin: Warm and dry.  Intact. Psych: +Flat. Musc: No edema in extremities.  No tenderness in extremities. GI: Non-distended.  +PEG. Neurological: Alert Dysarthria, improving Motor: Grossly 4/5 throughout, improving  Assessment/Plan: 1. 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  Physiatrist is providing close team supervision and 24 hour management of active medical problems listed below.  Physiatrist and rehab team continue to assess barriers to discharge/monitor patient progress toward functional and medical goals  Care Tool:  Bathing  Bathing activity did not occur: Refused Body parts bathed by patient: Right arm, Left arm, Chest, Abdomen, Front perineal area, Right upper leg, Left upper leg, Face   Body parts bathed by  helper: Buttocks, Right lower leg, Left lower leg     Bathing assist Assist Level: Moderate Assistance - Patient 50 - 74%     Upper Body Dressing/Undressing Upper body dressing   What is the patient wearing?: Pull over shirt    Upper body assist Assist Level: Moderate Assistance - Patient 50 - 74%    Lower Body Dressing/Undressing Lower body dressing      What is the patient wearing?: Pants     Lower body assist Assist for lower body dressing: Moderate Assistance - Patient 50 - 74%     Toileting Toileting Toileting Activity did not occur Landscape architect and hygiene only): Refused  Toileting assist Assist for toileting: Moderate Assistance - Patient 50 - 74% Assistive Device Comment: urinal   Transfers Chair/bed transfer  Transfers assist     Chair/bed transfer assist level: Minimal Assistance - Patient > 75%     Locomotion Ambulation   Ambulation assist      Assist level: Moderate Assistance - Patient 50 - 74% Assistive device: Walker-rolling Max distance: 3ft   Walk 10 feet activity   Assist     Assist level: Moderate Assistance - Patient - 50 - 74% Assistive device: Walker-rolling   Walk 50 feet activity   Assist    Assist level: Moderate Assistance - Patient - 50 - 74% Assistive device: Walker-rolling    Walk 150 feet activity   Assist Walk 150 feet activity did not occur: Safety/medical concerns  Assist level: (per report)      Walk 10 feet on uneven surface  activity  Assist Walk 10 feet on uneven surfaces activity did not occur: Safety/medical concerns         Wheelchair     Assist Will patient use wheelchair at discharge?: Yes Type of Wheelchair: Manual    Wheelchair assist level: Supervision/Verbal cueing Max wheelchair distance: 131ft    Wheelchair 50 feet with 2 turns activity    Assist        Assist Level: Supervision/Verbal cueing   Wheelchair 150 feet activity     Assist       Assist Level: Supervision/Verbal cueing   Blood pressure (!) 86/56, pulse 66, temperature 98.2 F (36.8 C), resp. rate 18, weight 53.2 kg, SpO2 100 %.  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  2. Antithrombotics: -DVT/anticoagulation:Pharmaceutical:Lovenox -antiplatelet therapy: N/A 3. Pain Management:Tramadol or Tylenol prn for abdominal pain. 4. Mood:LCSW to follow for evaluation and support -antipsychotic agents: N/A 5. Neuropsych: This patientis not fullycapable of making decisions on hisown behalf. 6. Skin/Wound Care:routine pressure relief measures 7. Fluids/Electrolytes/Nutrition: 8. 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 10. Malnutrition/Severe dysphagia: see above.  PEG placed on 09/02/2019  Unable to tolerate bolus tube feeds, converted back to continuous.  DCed tube feeds  D2 nectars started on 12/19, no documented po intake last 24 hours 11. Hypomagnesemia: IV supplement as needed. added supplement thru peg, increased on 12/18.   Mag 1.7 on 12/18, will reorder later this week 12.  Hypotension  Labile on 12/23  Midodrine increased to 5 TID on 09/11/2019, communicated with pharmacy 13.  Hyponatremia  Sodium 132 on 12/21, plan to order labs later this week  Will consider decreased free water flushes if trends down  Continue to monitor 14.  Leukopenia-secondary to medications  WBCs 2.4 on 12/21  Continue to monitor 15.  Acute blood loss anemia  Hemoglobin 11.0 on 12/21  Continue to monitor 16. CBGs   TF d/ced, CBGs  d/ced CBG (last 3)  Recent Labs    09/17/19 2047 09/18/19 0017 09/18/19 0414  GLUCAP 89 83 76   Controlled on 12/23 17.  Slow transit constipation  Bowel meds increased on 12/16  LOS: 11 days A FACE TO FACE EVALUATION WAS PERFORMED  Caris Cerveny Karis Juba 09/18/2019, 8:46 AM

## 2019-09-18 NOTE — Progress Notes (Signed)
Speech Language Pathology Daily Session Note  Patient Details  Name: Arthur [name] MRN: [id number] Date of Birth: 1981/08/10  Today's Date: 09/18/2019 SLP Individual Time: 0735-0802 SLP Individual Time Calculation (min): 27 min  Short Term Goals: Week 2: SLP Short Term Goal 1 (Week 2): Pt will consume current Dys 2/nectar diet with Min overt s/sx aspiration and efficient mastication and oral clearance with Mod A verbal cues for use of swallow strategies. SLP Short Term Goal 2 (Week 2): Pt will consume therapeutic trials of ice chips with Min overt s/sx aspiration to determine readiness or necessecity for another instrumental swallow evaluation. SLP Short Term Goal 3 (Week 2): Pt will increase vocal intensity and overarticulate to achieve speech intelligibility at the word level with Min assist verbal cues. SLP Short Term Goal 4 (Week 2): Pt will complete mildly complex tasks with Supervision assist verbal cues for functional problem solving. SLP Short Term Goal 5 (Week 2): Pt will use external aids for recall of daily information with Min assist verbal and visual cues.  Skilled Therapeutic Interventions:  Skilled treatment session focused on dysphagia and speech communication goals. At baseline, pt with evidenced of dried salvia on right buccal area but vocal quality was not wet. SLP provided skilled observation of pt consuming thin liquids via cup. With small controlled sips, pt was free of overt s/s of aspiration. However during last portion of consumption, pt with cough d/t large consecutive boluses. SLP also provided skilled observation of pt consuming dysphagia 2 breakfast tray with nectar thick liquids via spoon. Pt able to independently state need for use of spoon with nectar thick liquids. He did required Mod A verbal cues for smaller bolus size with dysphagia 2. Pt's speech intelligibility was much improved over last session with this Probation officer. Pt able to recall and utilize  over-articulation and increased vocal intensity to achieve ~ 80 to 90% speech intelligibility at the sentence level. As such, pt able to effectively communicate information to this writer about his family (specifically his children) and activities that he was performing in PT to help him. He was able to demonstrate intellectual awareness and list his current physical deficits. Pt handed off to PT.      Pain Pain Assessment Pain Scale: 0-10 Pain Score: 0-No pain  Therapy/Group: Individual Therapy  Sanayah Munro 09/18/2019, 12:43 PM

## 2019-09-18 NOTE — Progress Notes (Signed)
Hypoglycemic Event  CBG: 69  Treatment: 4 oz juice/soda  Symptoms: None  Follow-up CBG: Time:2047 CBG Result:89  Possible Reasons for Event: Vomiting  Comments/MD notified: Ankit Rich Fuchs

## 2019-09-19 ENCOUNTER — Encounter (HOSPITAL_COMMUNITY): Payer: Self-pay | Admitting: Speech Pathology

## 2019-09-19 ENCOUNTER — Inpatient Hospital Stay (HOSPITAL_COMMUNITY): Payer: Self-pay | Admitting: Speech Pathology

## 2019-09-19 ENCOUNTER — Inpatient Hospital Stay (HOSPITAL_COMMUNITY): Payer: Self-pay

## 2019-09-19 ENCOUNTER — Ambulatory Visit (HOSPITAL_COMMUNITY): Payer: Self-pay

## 2019-09-19 LAB — GLUCOSE, CAPILLARY
Glucose-Capillary: 77 mg/dL (ref 70–99)
Glucose-Capillary: 77 mg/dL (ref 70–99)
Glucose-Capillary: 85 mg/dL (ref 70–99)
Glucose-Capillary: 90 mg/dL (ref 70–99)

## 2019-09-19 NOTE — Progress Notes (Signed)
Speech Language Pathology Daily Session Note  Patient Details  Name: Arthur [name] MRN: [id number] Date of Birth: January 17, 1981  Today's Date: 09/19/2019 SLP Individual Time: 1100-1200 SLP Individual Time Calculation (min): 60 min  Short Term Goals: Week 2: SLP Short Term Goal 1 (Week 2): Pt will consume current Dys 2/nectar diet with Min overt s/sx aspiration and efficient mastication and oral clearance with Mod A verbal cues for use of swallow strategies. SLP Short Term Goal 2 (Week 2): Pt will consume therapeutic trials of ice chips with Min overt s/sx aspiration to determine readiness or necessecity for another instrumental swallow evaluation. SLP Short Term Goal 3 (Week 2): Pt will increase vocal intensity and overarticulate to achieve speech intelligibility at the word level with Min assist verbal cues. SLP Short Term Goal 4 (Week 2): Pt will complete mildly complex tasks with Supervision assist verbal cues for functional problem solving. SLP Short Term Goal 5 (Week 2): Pt will use external aids for recall of daily information with Min assist verbal and visual cues.  Skilled Therapeutic Interventions: Pt was seen for skilled ST targeting dysphagia and cognitive goals. Following pt's completion of oral care with Min A verbal cues for thoroughness, pt accepted ~8oz of thin H2O with 1 immediate cough following a large cup sip. Mod A verbal cues were required for pt to rationalize and implement SMALL sips as opposed to large gulps for swallow safety, however when implemented pt consumed without overt s/sx aspiration. Recommend initiation of water protocol (sign with protocol details listed at head of bed). Anticipate pt will be ready for repeat MBSS early next week. After completion of thin intake, SLP provided upgraded Dys 3 solid trial, during which pt exhibited slightly prolonged but functional mastication and oral clearance. Recommend continue current diet for now but provided at least 1  additional trial of Dys 3, pt may be appropriate to upgrade solids. SLP further facilitated session with complex medication management activity. Pt required Max A verbal cues in order to recall functions of medications. SLP introduced "chunking" strategy to promote better recall of current medications (ex: 2 HIV meds, 1 blood pressure, etc.). Pt organized a QID pill box according to his list of medications with Min A for organization and problem solving, Mod A for error awareness throughout activity. Pt left laying in bed with alarm set and needs within reach. Continue per current plan of care.    Pain Pain Assessment Pain Scale: 0-10 Pain Score: 0-No pain  Therapy/Group: Individual Therapy  Arbutus Leas 09/19/2019, 12:06 PM

## 2019-09-19 NOTE — Progress Notes (Signed)
Physical Therapy Session Note  Patient Details  Name: Arthur [name] MRN: [id number] Date of Birth: 06/04/1981  Today's Date: 09/19/2019 PT Individual Time: [phone] and 1430-1503 PT Individual Time Calculation (min): 40 min and 33 min  Short Term Goals: Week 1:  PT Short Term Goal 1 (Week 1): Pt will ambulate 100 ft with LRAD and min assist PT Short Term Goal 1 - Progress (Week 1): Progressing toward goal PT Short Term Goal 2 (Week 1): Pt will ascend/descend 8 steps with rails and min assist PT Short Term Goal 2 - Progress (Week 1): Progressing toward goal PT Short Term Goal 3 (Week 1): Pt will perform sit<>stand with CGA PT Short Term Goal 3 - Progress (Week 1): Met Week 2:  PT Short Term Goal 1 (Week 2): Pt will ambulate 100 ft with LRAD and min assist PT Short Term Goal 2 (Week 2): Pt will ascend/descend 8 steps with rails and min assist PT Short Term Goal 3 (Week 2): Pt will perform car transfer with LRAD CGA  Skilled Therapeutic Interventions/Progress Updates:   Treatment Session 1: [phone] 40 min Received pt supine in bed, pt agreeable to therapy, and denied any pain during today's session. Session focused on functional mobility/transfers, LE strength, balance/coordination, motor control and planning, ambulation, and improved tolerance to activity. Pt performed bed mobility with supervision. Pt donned pants with mod A in sitting and mod A in standing with RW to pull pants over hips. Therapist donned shoes max A for time management purposes. Pt transferred stand<>pivot without AD x 4 trials throughout session min A. Pt performed WC mobility 1675f with bilateral UE and LE supervision. Pt ambulated 82fwith RW mod A overall with 1 posterior LOB requiring max A to correct. Pt required verbal cues for wider BOS and RW safety. Pt demonstrated narrow BOS, and tends to loose balance with turns due to mild bilateral LE scissoring. Pt also requires verbal cues to avoid obstacles on both  sides. Pt ran into 2 wheelchairs and wall during gait training. Pt ambulated [id number] parallel bars with RW min A and performed bilateral standing hip abduction x8 with verbal cues for technique and bilateral UE support CGA. Pt performed bilateral hip flexion x8 with bilateral UE support on parallel bars CGA. Pt required multiple rest break throughout session due to fatigue. Pt declined practicing stairs this morning, stating he would rather wait until this afternoon with his family. Pt requested to return to bed at end of session. Pt transferred stand<>pivot WC<>bed min A without AD and performed bed mobility with supervision. Concluded session with pt supine in bed, needs within reach, and bed alarm on.   Treatment Session 2: 1430-1503 33 min  Received pt supine in bed, pt agreeable to therapy, and denied any pain during session. Session focused on functional mobility/trasnfers, stair navigation, LE strength, balance/coordination, motor control and planning, and improved tolerance to activity. Pt performed bed mobility with supervision. PT donned shoes max A for time management. Pt transferred bed<>WC stand<>pivot without AD CGA. [facility] purposes. Pt navigated 8 steps with 2 rails ascending and descending with a step to pattern. Pt required verbal cues for foot placement on step. Pt performed lateral side stepping with bilateral UE support on rail [id number] trials with min A. Pt required verbal cues for increased step length, foot placement, and technique. Pt performed LE strengthening on Kinetron at 10cm/sec 1x20 with back support targeting quadriceps and 1x20 without  back support targeting glutes. Pt reported need to have BM and was transported back to room in [facility]. Transferred stand<>pivot WC<>bedside commode min A without AD. Pt able to perform LE clothing management with mod A but required total assist for hygiene. Pt transferred stand<>pivot into bed. Concluded session  with pt supine in bed, needs within reach, and bed alarm on.   Therapy Documentation Precautions:  Precautions Precautions: Fall Precaution Comments: PEG, foley, visual field loss Rt eye Restrictions Weight Bearing Restrictions: No  Therapy/Group: Individual Therapy Alfonse Alpers PT, DPT   09/19/2019, 7:33 AM

## 2019-09-19 NOTE — Progress Notes (Signed)
Water protocol done. No coughing episodes noted. Patient reminded to drink small sips and to tuck chin. Able to drink / cup of water.

## 2019-09-19 NOTE — Progress Notes (Signed)
Speech Language Pathology Daily Session Note  Patient Details  Name: Arthur [name] MRN: [id number] Date of Birth: 01-24-1981  Today's Date: 09/19/2019 SLP Individual Time: 1540-0867 SLP Individual Time Calculation (min): 30 min  Short Term Goals: Week 2: SLP Short Term Goal 1 (Week 2): Pt will consume current Dys 2/nectar diet with Min overt s/sx aspiration and efficient mastication and oral clearance with Mod A verbal cues for use of swallow strategies. SLP Short Term Goal 2 (Week 2): Pt will consume therapeutic trials of ice chips with Min overt s/sx aspiration to determine readiness or necessecity for another instrumental swallow evaluation. SLP Short Term Goal 3 (Week 2): Pt will increase vocal intensity and overarticulate to achieve speech intelligibility at the word level with Min assist verbal cues. SLP Short Term Goal 4 (Week 2): Pt will complete mildly complex tasks with Supervision assist verbal cues for functional problem solving. SLP Short Term Goal 5 (Week 2): Pt will use external aids for recall of daily information with Min assist verbal and visual cues.  Skilled Therapeutic Interventions: Pt was seen for skilled ST targeting dysphagia and speech goals. During skilled observation of pt consuming nectar thick liquids via cup, no overt s/sx aspiration were observed. Pt required Min A verbal cues to use slow rate/small sips. SLP further facilitated session with Min A verbal cues for repetition using overarticulation and increased nasopharyngeal airflow; pt uses increased vocal intensity with Supervision A. Pt was ~85% intelligible at the sentence level during a barrier speech task. Pt left laying in bed with alarm set and all needs within reach. Continue per current plan of care.       Pain Pain Assessment Pain Scale: Faces Pain Score: 0-No pain Faces Pain Scale: No hurt  Therapy/Group: Individual Therapy  Arbutus Leas 09/19/2019, 3:16 PM

## 2019-09-19 NOTE — Progress Notes (Signed)
[place] PHYSICAL MEDICINE & REHABILITATION PROGRESS NOTE   Subjective/Complaints: Patient seen laying in bed this morning.  He states he slept well overnight.  Denies abdominal pain.  Double portions ordered for meals yesterday, patient states he is eating.  ROS: Denies CP, SOB, N/V/D  Objective:   No results found. Recent Labs    09/16/19 1145  WBC 2.4*  HGB 11.0*  HCT 32.5*  PLT 237   Recent Labs    09/16/19 1145  NA 132*  K 4.0  CL 98  CO2 26  GLUCOSE 84  BUN 10  CREATININE 0.76  CALCIUM 9.0    Intake/Output Summary (Last 24 hours) at 09/19/2019 0912 Last data filed at 09/19/2019 0820 Gross per 24 hour  Intake 250 ml  Output 400 ml  Net -150 ml     Physical Exam: Vital Signs Blood pressure 98/66, pulse (!) 55, temperature 98.2 F (36.8 C), resp. rate 18, weight 54.3 kg, SpO2 97 %. Constitutional: No distress . Vital signs reviewed.  + Cachectic. HENT: Normocephalic.  Atraumatic. Eyes: EOMI. No discharge. Cardiovascular: No JVD. Respiratory: Normal effort.  No stridor. GI: Non-distended.  + PEG. Skin: Warm and dry.  Intact. Psych: + Flat. Musc: No edema in extremities.  No tenderness in extremities. Neurological: Alert Dysarthria, stable Motor: Grossly 4/5 throughout, improving  Assessment/Plan: 1. 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  Physiatrist is providing close team supervision and 24 hour management of active medical problems listed below.  Physiatrist and rehab team continue to assess barriers to discharge/monitor patient progress toward functional and medical goals  Care Tool:  Bathing  Bathing activity did not occur: Refused Body parts bathed by patient: Right arm, Left arm, Chest, Abdomen, Front perineal area, Right upper leg, Left upper leg, Face   Body parts bathed by helper: Buttocks, Right lower leg, Left lower  leg     Bathing assist Assist Level: Moderate Assistance - Patient 50 - 74%     Upper Body Dressing/Undressing Upper body dressing   What is the patient wearing?: Pull over shirt    Upper body assist Assist Level: Moderate Assistance - Patient 50 - 74%    Lower Body Dressing/Undressing Lower body dressing      What is the patient wearing?: Pants     Lower body assist Assist for lower body dressing: Moderate Assistance - Patient 50 - 74%     Toileting Toileting Toileting Activity did not occur Press photographer and hygiene only): Refused  Toileting assist Assist for toileting: Moderate Assistance - Patient 50 - 74% Assistive Device Comment: urinal   Transfers Chair/bed transfer  Transfers assist     Chair/bed transfer assist level: Minimal Assistance - Patient > 75%     Locomotion Ambulation   Ambulation assist      Assist level: Moderate Assistance - Patient 50 - 74% Assistive device: Walker-rolling Max distance: 80   Walk 10 feet activity   Assist     Assist level: Moderate Assistance - Patient - 50 - 74% Assistive device: Walker-rolling   Walk 50 feet activity   Assist    Assist level: Moderate Assistance - Patient - 50 - 74% Assistive device: Walker-rolling    Walk 150 feet activity   Assist Walk 150 feet activity did not occur: Safety/medical concerns  Assist level: (per report)      Walk 10 feet on uneven surface  activity   Assist  Walk 10 feet on uneven surfaces activity did not occur: Safety/medical concerns         Wheelchair     Assist Will patient use wheelchair at discharge?: Yes Type of Wheelchair: Manual    Wheelchair assist level: Supervision/Verbal cueing Max wheelchair distance: 163ft    Wheelchair 50 feet with 2 turns activity    Assist        Assist Level: Supervision/Verbal cueing   Wheelchair 150 feet activity     Assist      Assist Level: Supervision/Verbal cueing   Blood  pressure 98/66, pulse (!) 55, temperature 98.2 F (36.8 C), resp. rate 18, weight 54.3 kg, SpO2 97 %.  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 Antithrombotics: -DVT/anticoagulation:Pharmaceutical:Lovenox -antiplatelet therapy: N/A 3. Pain Management:Tramadol or Tylenol prn for abdominal pain. 4. Mood:LCSW to follow for evaluation and support -antipsychotic agents: N/A 5. Neuropsych: This patientis not fullycapable of making decisions on hisown behalf. 6. Skin/Wound Care:routine pressure relief measures 7. Fluids/Electrolytes/Nutrition: 8. 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 10. Malnutrition/Severe dysphagia: see above.  PEG placed on 09/02/2019  Unable to tolerate bolus tube feeds, converted back to continuous.  DCed tube feeds  D2 nectars started on 12/19, double portions ordered for meals, eating 11. Hypomagnesemia: IV supplement as needed. added supplement thru peg, increased on 12/18.   Mag 1.7 on 12/18, labs ordered for tomorrow 12.  Hypotension  Soft, but asymptomatic on 12/24  Midodrine increased to 5 TID on 09/11/2019, communicated with pharmacy 13.  Hyponatremia  Sodium 132 on 12/21, labs ordered for tomorrow  Will consider decreased free water flushes if trends down  Continue to monitor 14.  Leukopenia-secondary to medications  WBCs 2.4 on 12/21  Continue to monitor 15.  Acute blood loss anemia  Hemoglobin 11.0 on 12/21  Continue to monitor 16.  Slow transit constipation  Bowel meds increased on 12/16  LOS: 12 days A FACE TO FACE EVALUATION WAS PERFORMED  Jesalyn Finazzo Lorie Phenix 09/19/2019, 9:12 AM

## 2019-09-19 NOTE — Progress Notes (Signed)
Occupational Therapy Session Note  Patient Details  Name: Arthur [name] MRN: [id number] Date of Birth: 11/26/1980  Today's Date: 09/19/2019 OT Individual Time: 1300-1355 OT Individual Time Calculation (min): 55 min    Short Term Goals: Week 2:  OT Short Term Goal 1 (Week 2): Pt will don footwear with Mod A OT Short Term Goal 2 (Week 2): Pt will elevate pants over hips with no more than Min A for standing balance OT Short Term Goal 3 (Week 2): Pt will complete 3/3 toileting tasks with Min A overall  Skilled Therapeutic Interventions/Progress Updates:    Pt received supine in bed, having just finished lunch. Pt requesting to change shirt. Min A to pull shirt overhead 2/2 hair. Pt completed stand/squat pivot transfer to the w/c with CGA. Pt was taken down to the therapy gym in w/c. Pt completed several sets of functional stepping with a 4in step with varying levels of UE assist on RW. CGA-min A provided throughout, with more assist needed with only 1 UE on RW. Pt required cueing throughout session for UE placement with sit <> stands. Pt required seated rest breaks between each set. Pt then completed squats and single leg isolated abduction within the parallel bars ,facing the mirror for visual cues. CGA provided throughout as well as verbal cueing for encouragement.  3x 10 each exercise with rest breaks between. Pt returned to his room and requested to complete water protocol. Pt required cueing to complete oral care prior. Pt returned to supine with all needs met.   Therapy Documentation Precautions:  Precautions Precautions: Fall Precaution Comments: PEG, foley, visual field loss Rt eye Restrictions Weight Bearing Restrictions: No  Therapy/Group: Individual Therapy  Curtis Sites 09/19/2019, 6:54 AM

## 2019-09-20 DIAGNOSIS — F0280 Dementia in other diseases classified elsewhere without behavioral disturbance: Secondary | ICD-10-CM

## 2019-09-20 LAB — BASIC METABOLIC PANEL
Anion gap: 10 (ref 5–15)
BUN: 13 mg/dL (ref 6–20)
CO2: 20 mmol/L — ABNORMAL LOW (ref 22–32)
Calcium: 8.9 mg/dL (ref 8.9–10.3)
Chloride: 105 mmol/L (ref 98–111)
Creatinine, Ser: 0.86 mg/dL (ref 0.61–1.24)
GFR calc Af Amer: >60 mL/min (ref >60)
GFR calc non Af Amer: >60 mL/min (ref >60)
Glucose, Bld: 78 mg/dL (ref 70–99)
Potassium: 4.6 mmol/L (ref 3.5–5.1)
Sodium: 135 mmol/L (ref 135–145)

## 2019-09-20 LAB — MAGNESIUM: Magnesium: 1.7 mg/dL (ref 1.7–2.4)

## 2019-09-20 NOTE — Plan of Care (Signed)
  Problem: Consults Goal: RH GENERAL PATIENT EDUCATION Description: See Patient Education module for education specifics. Outcome: Progressing   Problem: RH BOWEL ELIMINATION Goal: RH STG MANAGE BOWEL WITH ASSISTANCE Description: STG Manage Bowel with min Assistance. Outcome: Progressing   Problem: RH BLADDER ELIMINATION Goal: RH STG MANAGE BLADDER WITH ASSISTANCE Description: STG Manage Bladder With min Assistance Outcome: Progressing   Problem: RH SKIN INTEGRITY Goal: RH STG SKIN FREE OF INFECTION/BREAKDOWN Description: No new skin breakdown Outcome: Progressing Goal: RH STG MAINTAIN SKIN INTEGRITY WITH ASSISTANCE Description: STG Maintain Skin Integrity With min Assistance. Outcome: Progressing Goal: RH STG ABLE TO PERFORM INCISION/WOUND CARE W/ASSISTANCE Description: STG Able To Perform Incision/Wound Care With min Assistance. Outcome: Progressing   Problem: RH SAFETY Goal: RH STG ADHERE TO SAFETY PRECAUTIONS W/ASSISTANCE/DEVICE Description: STG Adhere to Safety Precautions With min Assistance/Device. Outcome: Progressing   Problem: RH KNOWLEDGE DEFICIT GENERAL Goal: RH STG INCREASE KNOWLEDGE OF SELF CARE AFTER HOSPITALIZATION Outcome: Progressing   

## 2019-09-20 NOTE — Progress Notes (Signed)
[place] PHYSICAL MEDICINE & REHABILITATION PROGRESS NOTE   Subjective/Complaints: Patient seen laying in bed this morning.  He states that well overnight.  He is happy about being able to drink water.  ROS: Denies CP, SOB, N/V/D  Objective:   No results found. No results for input(s): WBC, HGB, HCT, PLT in the last 72 hours. Recent Labs    09/20/19 0445  NA 135  K 4.6  CL 105  CO2 20*  GLUCOSE 78  BUN 13  CREATININE 0.86  CALCIUM 8.9    Intake/Output Summary (Last 24 hours) at 09/20/2019 1324 Last data filed at 09/20/2019 1140 Gross per 24 hour  Intake 480 ml  Output 1900 ml  Net -1420 ml     Physical Exam: Vital Signs Blood pressure (!) 93/59, pulse (!) 55, temperature 98 F (36.7 C), resp. rate 17, weight 54.8 kg, SpO2 100 %.  Constitutional: No distress . Vital signs reviewed.  + Cachectic. HENT: Normocephalic.  Atraumatic. Eyes: EOMI. No discharge. Cardiovascular: No JVD. Respiratory: Normal effort.  No stridor. GI: Non-distended.  + PEG. Skin: Warm and dry.  Intact. Psych: Normal mood.  Normal behavior. Musc: No edema in extremities.  No tenderness in extremities. Neurological: Alert Dysarthria, unchanged Motor: Grossly 4/5 throughout, stable  Assessment/Plan: 1. 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  Physiatrist is providing close team supervision and 24 hour management of active medical problems listed below.  Physiatrist and rehab team continue to assess barriers to discharge/monitor patient progress toward functional and medical goals  Care Tool:  Bathing  Bathing activity did not occur: Refused Body parts bathed by patient: Right arm, Left arm, Chest, Abdomen, Front perineal area, Right upper leg, Left upper leg, Face   Body parts bathed by helper: Buttocks, Right lower leg, Left lower leg     Bathing assist Assist Level:  Moderate Assistance - Patient 50 - 74%     Upper Body Dressing/Undressing Upper body dressing   What is the patient wearing?: Pull over shirt    Upper body assist Assist Level: Moderate Assistance - Patient 50 - 74%    Lower Body Dressing/Undressing Lower body dressing      What is the patient wearing?: Pants     Lower body assist Assist for lower body dressing: Moderate Assistance - Patient 50 - 74%     Toileting Toileting Toileting Activity did not occur Landscape architect and hygiene only): Refused  Toileting assist Assist for toileting: Moderate Assistance - Patient 50 - 74% Assistive Device Comment: bedside commode   Transfers Chair/bed transfer  Transfers assist     Chair/bed transfer assist level: Minimal Assistance - Patient > 75%     Locomotion Ambulation   Ambulation assist      Assist level: Moderate Assistance - Patient 50 - 74% Assistive device: Walker-rolling Max distance: 41ft   Walk 10 feet activity   Assist     Assist level: Moderate Assistance - Patient - 50 - 74% Assistive device: Walker-rolling   Walk 50 feet activity   Assist    Assist level: Moderate Assistance - Patient - 50 - 74% Assistive device: Walker-rolling    Walk 150 feet activity   Assist Walk 150 feet activity did not occur: Safety/medical concerns  Assist level: (per report)      Walk 10 feet on uneven surface  activity   Assist Walk 10 feet on uneven surfaces activity did not  occur: Safety/medical concerns         Wheelchair     Assist Will patient use wheelchair at discharge?: Yes Type of Wheelchair: Manual    Wheelchair assist level: Supervision/Verbal cueing Max wheelchair distance: 144ft    Wheelchair 50 feet with 2 turns activity    Assist        Assist Level: Supervision/Verbal cueing   Wheelchair 150 feet activity     Assist      Assist Level: Supervision/Verbal cueing   Blood pressure (!) 93/59, pulse (!)  55, temperature 98 F (36.7 C), resp. rate 17, weight 54.8 kg, SpO2 100 %.  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 Antithrombotics: -DVT/anticoagulation:Pharmaceutical:Lovenox -antiplatelet therapy: N/A 3. Pain Management:Tramadol or Tylenol prn for abdominal pain. 4. Mood:LCSW to follow for evaluation and support -antipsychotic agents: N/A 5. Neuropsych: This patientis not fullycapable of making decisions on hisown behalf. 6. Skin/Wound Care:routine pressure relief measures 7. Fluids/Electrolytes/Nutrition: 8. 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 10. Malnutrition/Severe dysphagia: see above.  PEG placed on 09/02/2019  Unable to tolerate bolus tube feeds, converted back to continuous.  DCed tube feeds  D2 nectars started on 12/19, double portions ordered for meals, eating well  Freewater protocol 11. Hypomagnesemia: Supplement as needed. added supplement thru peg, increased on 12/18.   Mag 1.7 on 12/25  Continue to monitor 12.  Hypotension  Labile and soft, but asymptomatic on 12/25  Midodrine increased to 5 TID on 09/11/2019, communicated with pharmacy 13.  Hyponatremia  Sodium 135 on 12/25  Continue to monitor 14.  Leukopenia-secondary to medications  WBCs 2.4 on 12/21, labs ordered for Monday  Continue to monitor 15.  Acute blood loss anemia  Hemoglobin 11.0 on 12/21, labs ordered for Monday  Continue to monitor 16.  Slow transit constipation  Bowel meds increased on 12/16  LOS: 13 days A FACE TO FACE EVALUATION WAS PERFORMED  Boni Maclellan Karis Juba 09/20/2019, 1:24 PM

## 2019-09-21 ENCOUNTER — Inpatient Hospital Stay (HOSPITAL_COMMUNITY): Payer: Self-pay | Admitting: Occupational Therapy

## 2019-09-21 ENCOUNTER — Inpatient Hospital Stay (HOSPITAL_COMMUNITY): Payer: Self-pay

## 2019-09-21 ENCOUNTER — Inpatient Hospital Stay (HOSPITAL_COMMUNITY): Payer: Self-pay | Admitting: Physical Therapy

## 2019-09-21 MED ORDER — MECLIZINE HCL 25 MG PO TABS
25.0000 mg | ORAL_TABLET | Freq: Every day | ORAL | Status: DC
  Administered 2019-09-21 – 2019-09-27 (×7): 25 mg via ORAL
  Filled 2019-09-21 (×7): qty 1

## 2019-09-21 NOTE — Progress Notes (Signed)
Occupational Therapy Session Note  Patient Details  Name: Arthur [name] MRN: [id number] Date of Birth: [birth year]  Today's Date: 09/21/2019 OT Individual Time: 1301-1330 OT Individual Time Calculation (min): 29 min   Short Term Goals: Week 2:  OT Short Term Goal 1 (Week 2): Pt will don footwear with Mod A OT Short Term Goal 2 (Week 2): Pt will elevate pants over hips with no more than Min A for standing balance OT Short Term Goal 3 (Week 2): Pt will complete 3/3 toileting tasks with Min A overall  Skilled Therapeutic Interventions/Progress Updates:    Pt greeted in bed with no c/o pain. Supine<sit from flat bed without bedrail completed with close supervision for balance and vcs. Once EOB, worked on ADL retraining by having pt don his sneakers. Vcs for using figure 4 bilaterally to meet demands of task, no issues with Rt shoe. Noted increased difficultly with donning the other shoe due to decreased functional mobility of the L LE, able to use figure 4 to don sneaker but unable to tie laces this way. OT placed a chair in front to prop up foot, with pt still unable to tie unassisted. Min A for balance due to LOBs backwards and towards the Rt when provided supervision alone. He then ambulated with RW down to the RN station with Min-Mod A overall. Vcs for keeping feet apart due to scissoring and slowing down pace. Note that he had frequent LOBs towards the Lt needing A to recover. After sitting in front of RN station to rest, he ambulated back to room in the same manner. He returned to bed with supervision, using the bedrails for repositioning. At end of session pt remained in bed with all needs within reach and bed alarm set.      Therapy Documentation  Precautions:  Precautions Precautions: Fall Precaution Comments: PEG, foley, visual field loss Rt eye Restrictions Weight Bearing Restrictions: No Vital Signs: Therapy Vitals Temp: 98.7 F (37.1 C) Pulse Rate: 78 Resp: 18 BP: (!)  95/59 Patient Position (if appropriate): Lying Oxygen Therapy SpO2: 99 % O2 Device: Room Air ADL: ADL Eating: NPO Grooming: Moderate assistance Where Assessed-Grooming: Bed level, Edge of bed Upper Body Bathing: Supervision/safety Where Assessed-Upper Body Bathing: Edge of bed Lower Body Bathing: Moderate assistance Where Assessed-Lower Body Bathing: Edge of bed Upper Body Dressing: Minimal assistance Where Assessed-Upper Body Dressing: Edge of bed Lower Body Dressing: Maximal assistance Where Assessed-Lower Body Dressing: Edge of bed Toileting: Not assessed Toilet Transfer: Moderate assistance Toilet Transfer Method: Stand pivot Toilet Transfer Equipment: Engineer, technical sales Transfer: Not assessed      Therapy/Group: Individual Therapy  [name] A [name] 09/21/2019, 3:53 PM

## 2019-09-21 NOTE — Progress Notes (Signed)
[place] PHYSICAL MEDICINE & REHABILITATION PROGRESS NOTE   Subjective/Complaints: Patient seen laying in bed this morning, sleepy.  Therapy yesterday was limited by dizziness, but patient performed ADLs with MinA.   ROS: Denies CP, SOB, N/V/D  Objective:   No results found. No results for input(s): WBC, HGB, HCT, PLT in the last 72 hours. Recent Labs    09/20/19 0445  NA 135  K 4.6  CL 105  CO2 20*  GLUCOSE 78  BUN 13  CREATININE 0.86  CALCIUM 8.9    Intake/Output Summary (Last 24 hours) at 09/21/2019 1047 Last data filed at 09/21/2019 0540 Gross per 24 hour  Intake --  Output 625 ml  Net -625 ml     Physical Exam: Vital Signs Blood pressure 98/68, pulse 61, temperature 97.9 F (36.6 C), temperature source Oral, resp. rate 16, weight 53.4 kg, SpO2 98 %.  Constitutional: No distress . Vital signs reviewed.  + Cachectic. HENT: Normocephalic.  Atraumatic. Eyes: EOMI. No discharge. Cardiovascular: No JVD. Respiratory: Normal effort.  No stridor. GI: Non-distended.  + PEG. Skin: Warm and dry.  Intact. Psych: Normal mood.  Normal behavior. Musc: No edema in extremities.  No tenderness in extremities. Neurological: Alert Dysarthria, unchanged Motor: Grossly 4/5 throughout, stable  Assessment/Plan: 1. 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  Physiatrist is providing close team supervision and 24 hour management of active medical problems listed below.  Physiatrist and rehab team continue to assess barriers to discharge/monitor patient progress toward functional and medical goals  Care Tool:  Bathing  Bathing activity did not occur: Refused Body parts bathed by patient: Right arm, Left arm, Chest, Abdomen, Front perineal area, Right upper leg, Left upper leg, Face   Body parts bathed by helper: Buttocks, Right lower leg, Left lower leg      Bathing assist Assist Level: Minimal Assistance - Patient > 75%     Upper Body Dressing/Undressing Upper body dressing   What is the patient wearing?: Pull over shirt    Upper body assist Assist Level: Minimal Assistance - Patient > 75%    Lower Body Dressing/Undressing Lower body dressing      What is the patient wearing?: Pants, Incontinence brief     Lower body assist Assist for lower body dressing: Minimal Assistance - Patient > 75%     Toileting Toileting Toileting Activity did not occur (Clothing management and hygiene only): Refused  Toileting assist Assist for toileting: Moderate Assistance - Patient 50 - 74% Assistive Device Comment: bedside commode   Transfers Chair/bed transfer  Transfers assist     Chair/bed transfer assist level: Minimal Assistance - Patient > 75%     Locomotion Ambulation   Ambulation assist      Assist level: Moderate Assistance - Patient 50 - 74% Assistive device: Walker-rolling Max distance: 34ft   Walk 10 feet activity   Assist     Assist level: Moderate Assistance - Patient - 50 - 74% Assistive device: Walker-rolling   Walk 50 feet activity   Assist    Assist level: Moderate Assistance - Patient - 50 - 74% Assistive device: Walker-rolling    Walk 150 feet activity   Assist Walk 150 feet activity did not occur: Safety/medical concerns  Assist level: (per report)      Walk 10 feet on uneven surface  activity   Assist Walk 10 feet on uneven surfaces activity did not occur: Safety/medical  concerns         Wheelchair     Assist Will patient use wheelchair at discharge?: Yes Type of Wheelchair: Manual    Wheelchair assist level: Supervision/Verbal cueing Max wheelchair distance: 157ft    Wheelchair 50 feet with 2 turns activity    Assist        Assist Level: Supervision/Verbal cueing   Wheelchair 150 feet activity     Assist      Assist Level: Supervision/Verbal cueing    Blood pressure 98/68, pulse 61, temperature 97.9 F (36.6 C), temperature source Oral, resp. rate 16, weight 53.4 kg, SpO2 98 %.  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 Antithrombotics: -DVT/anticoagulation:Pharmaceutical:Lovenox -antiplatelet therapy: N/A 3. Pain Management:Tramadol or Tylenol prn for abdominal pain. 4. Mood:LCSW to follow for evaluation and support -antipsychotic agents: N/A 5. Neuropsych: This patientis not fullycapable of making decisions on hisown behalf. 6. Skin/Wound Care:routine pressure relief measures 7. Fluids/Electrolytes/Nutrition: 8. 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 10. Malnutrition/Severe dysphagia: see above.  PEG placed on 09/02/2019  Unable to tolerate bolus tube feeds, converted back to continuous.  DCed tube feeds  D2 nectars started on 12/19, double portions ordered for meals, eating well  Freewater protocol 11. Hypomagnesemia: Supplement as needed. added supplement thru peg, increased on 12/18.   Mag 1.7 on 12/25  Continue to monitor 12.  Hypotension  Labile and soft, but asymptomatic on 12/25  Midodrine increased to 5 TID on 09/11/2019, communicated with pharmacy 13.  Hyponatremia  Sodium 135 on 12/25  Continue to monitor 14.  Leukopenia-secondary to medications  WBCs 2.4 on 12/21, labs ordered for Monday  Continue to monitor 15.  Acute blood loss anemia  Hemoglobin 11.0 on 12/21, labs ordered for Monday  Continue to monitor 16.  Slow transit constipation  Bowel meds increased on 12/16 17. Dizziness: 12/26: limiting therapy sessions and ability to perform ADLs. Occurs with standing, which may be related to hypotension (patient is still at 90s systolic at  rest--he is currently on Midodrine and we can consider increasing dose. Also may have vestibular component as is worse when patient moves head side to side. Will start Meclizine standing to see if this provides relief during therapy sessions; I am doubtful patient would ask for medication of prescribed prn.   LOS: 14 days A FACE TO FACE EVALUATION WAS PERFORMED  Arthur [name] 09/21/2019, 10:47 AM

## 2019-09-21 NOTE — Progress Notes (Signed)
Physical Therapy Session Note  Patient Details  Name: Arthur [name] MRN: [id number] Date of Birth: 1980/09/29  Today's Date: 09/21/2019 PT Individual Time: 7341-9379 PT Individual Time Calculation (min): 27 min   Short Term Goals: Week 3:  PT Short Term Goal 1 (Week 3): STG=LTG due to LOS  Skilled Therapeutic Interventions/Progress Updates:   Pt received supine in bed and agreeable to therapy session. Pt's mother present and inquiring about pt's need for power recliner to assist with sit<>stands at home - therapist educated her on pt's CLOF and anticipation for continued improvement to increase pt's independence with functional transfers. Supine>sit, HOB partially elevated and using bedrails with supervision. Sitting EOB donned B shoes mod assist for time management. Sit>stand EOB>RW with min assist/CGA for lifting/balance. Gait ~23ft to w/c using RW with CGA/min assist for steadying and cuing to turn fully prior to initiating sitting.  Transported to/from gym in w/c for energy conservation. Gait 162ft using RW with min assist for balance - continues to demonstrate intermittent increased hip adduction causing narrow BOS, decreased foot clearance (R more impaired than L), and decreased heel strike on initial contact (R more impaired than L). Repeated sit<>stands partially elevated EOM<>B HHA 2 sets of 4 reps with min assist for eccentric control towards end ROM and balance in standing - cuing for increased anterior weight shift upon coming to standing due to posterior lean. Dynamic standing balance task of alternate B LE heel taps on 4" step without UE support - min assist for balance due to posterior and varying R/L lateral lean - cuing throughout for improved midline orientation and decreasing speed of movement to recover balance between heel taps. Stand pivot EOM>w/c using RW with CGA/min assist for steadying. Transported back to room in w/c and pt agreeable to remain sitting up - left with needs in  reach, chair alarm on, and pt's mother present.   Therapy Documentation Precautions:  Precautions Precautions: Fall Precaution Comments: PEG, foley, visual field loss Rt eye Restrictions Weight Bearing Restrictions: No   Pain:   Denies pain during session.    Therapy/Group: Individual Therapy  Tawana Scale, PT, DPT 09/21/2019, 3:36 PM

## 2019-09-21 NOTE — Progress Notes (Signed)
Physical Therapy Weekly Progress Note  Patient Details  Name: Arthur [name] MRN: [id number] Date of Birth: 1981/05/15  Beginning of progress report period: September 08, 2019 End of progress report period: September 21, 2019  Today's Date: 09/21/2019 PT Individual Time: 3299-2426 PT Individual Time Calculation (min): 70 min   Patient has met 2 of 3 short term goals. Pt demonstrates improvements in LE strength, dynamic standing balance and motor control with ambulation, and endurance with activity. Pt currently requires supervision for bed mobility, min A for transfers without AD, min A for ambulation 130f with RW, min A to navigate 8 steps with 2 rails, and supervision for WC mobility 1559f However, pt continues to demonstrate decreased safety awareness with mobility, posterior lean when standing, poor eccentric control when sitting, and increased fatigue with activity.   Patient continues to demonstrate the following deficits muscle weakness, impaired timing and sequencing, ataxia, decreased coordination and decreased motor planning and decreased standing balance, decreased postural control and decreased balance strategies and therefore will continue to benefit from skilled PT intervention to increase functional independence with mobility.  Patient progressing toward long term goals.. Continue plan of care.  PT Short Term Goals Week 2:  PT Short Term Goal 1 (Week 2): Pt will ambulate 100 ft with LRAD and min assist PT Short Term Goal 1 - Progress (Week 2): Met PT Short Term Goal 2 (Week 2): Pt will ascend/descend 8 steps with rails and min assist PT Short Term Goal 2 - Progress (Week 2): Met PT Short Term Goal 3 (Week 2): Pt will perform car transfer with LRAD CGA PT Short Term Goal 3 - Progress (Week 2): Progressing toward goal Week 3:  PT Short Term Goal 1 (Week 3): STG=LTG due to LOS  Skilled Therapeutic Interventions/Progress Updates:  Ambulation/gait training;Community  reintegration;DME/adaptive equipment instruction;Neuromuscular re-education;Stair training;UE/LE Strength taining/ROM;Psychosocial support;Therapeutic Activities;Functional electrical stimulation;Discharge planning;Balance/vestibular training;UE/LE Coordination activities;Therapeutic Exercise;Patient/family education;Functional mobility training;Cognitive remediation/compensation;Splinting/orthotics;Visual/perceptual remediation/compensation;Wheelchair propulsion/positioning;Skin care/wound management;Pain management;Disease management/prevention   Today's Interventions: Received pt supine in bed, pt agreeable to therapy, and denied any pain during today's session. Session focused on functional mobility/transfers, LE strength, ambulation, simulated car transfer, stair navigation, balance/coordination/motor control and planning, and improved tolerance to activity. Pt reported fatigue throughout session and wasn't as talkative today. Pt performed bed mobility with supervision. Pt donned shoes mod A for time management purposes. Pt transferred stand<>pivot bed<>WC without AD min A. [facility] Pt performed car transfer without AD min A x 2 trials. Pt required verbal cues for technique to turn prior to sitting for safety. Pt with poor eccentric control with descent. Pt ambulated 1012fn uneven surfaces (ramp) with RW mod A. Pt demonstrated posterior lean and required verbal cues for RW safety and increased step width.  Pt ambulated [id number] RW min A. Pt required verbal cues for RW safety and increased step width. Pt navigated 8 steps with 2 rails min A ascending with a step through and descending with a step to pattern. Pt transferred stand<>pivot WC<> Nustep without AD CGA. Pt performed UE and LE strengthening on Nustep for 6 minutes at workload 4 for total of 140 steps. Pt reported 7/10 fatigue after activity and required increased seated rest breaks throughout  session. Pt transferred sit<>stand with RW CGA x5 reps with verbal cues to push from armrests. Pt performed seated LE strengthening including bilateral hip flexion, knee extension, hip abd and add against manual resistance provided by therapist x10 reps. Pt  performed seated LE strengthening on Kinetron 2x20 with back support focusing on glute activation and x20 without back support focusing on glute activation at 10cm/sec. Pt performed WC mobility 14f back to room using bilateral UE and LE's. Concluded session with pt supine in bed, needs within reach, and bed alarm on.   Therapy Documentation Precautions:  Precautions Precautions: Fall Precaution Comments: PEG, foley, visual field loss Rt eye Restrictions Weight Bearing Restrictions: No   Therapy/Group: Individual Therapy AAlfonse AlpersPT, DPT   09/21/2019, 7:42 AM

## 2019-09-21 NOTE — Progress Notes (Signed)
Occupational Therapy Session Note  Patient Details  Name: Arthur [name] MRN: [id number] Date of Birth: [birth year]  Today's Date: 09/21/2019 OT Individual Time: 7517-0017 OT Individual Time Calculation (min): 71 min    Short Term Goals: Week 2:  OT Short Term Goal 1 (Week 2): Pt will don footwear with Mod A OT Short Term Goal 2 (Week 2): Pt will elevate pants over hips with no more than Min A for standing balance OT Short Term Goal 3 (Week 2): Pt will complete 3/3 toileting tasks with Min A overall  Skilled Therapeutic Interventions/Progress Updates:    Treatment session with focus on sit <> stand, dynamic standing balance, and balance reactions during self-care tasks.  Pt received supine in bed agreeable to bathing/dressing at sink.  Completed bed mobility with supervision and stand pivot transfer to w/c Min assist.  Engaged in bathing with increased time due to frequent self-selected rest breaks.  Pt required assistance when pulling shirt overhead (due to BUE weakness and hair).  Pt declined washing buttocks, stating increased dizziness when looking side to side, therefore encouraged pt to focus on maintaining upright standing posture while therapist washed buttocks.  Pt continues to demonstrate posterior lean in standing, requiring cues to correct.  Educated on figure 4 positioning when donning pants, pt still requiring min assist to thread pant legs but overall improvement.  Engaged in sit > stand throughout session with cues for hand placement, as pt attempting to pull up on sink.  Pt able to pull pants over hips with Min assist for standing balance.  Pt declined changing socks or donning shoes this session.  Oral care completed in sitting as pt reports too fatigued to complete in standing at this time, again noting dizziness.  BP assessed, WNL.  Pt completed stand pivot transfer CGA w/c > bed and left supine in bed with all needs in reach.  Therapy Documentation Precautions:   Precautions Precautions: Fall Precaution Comments: PEG, foley, visual field loss Rt eye Restrictions Weight Bearing Restrictions: No General:   Vital Signs: Therapy Vitals BP: 98/68 Patient Position (if appropriate): Lying Pain:  Pt with no c/o pain   Therapy/Group: Individual Therapy  Simonne Come 09/21/2019, 9:24 AM

## 2019-09-22 ENCOUNTER — Inpatient Hospital Stay (HOSPITAL_COMMUNITY): Payer: Self-pay

## 2019-09-22 MED ORDER — FOOD THICKENER (SIMPLYTHICK)
1.0000 | ORAL | Status: DC | PRN
  Filled 2019-09-22 (×5): qty 1

## 2019-09-22 MED ORDER — POLYETHYLENE GLYCOL 3350 17 G PO PACK
17.0000 g | PACK | Freq: Every day | ORAL | Status: DC
  Administered 2019-09-22 – 2019-09-27 (×4): 17 g via ORAL
  Filled 2019-09-22 (×5): qty 1

## 2019-09-22 NOTE — Progress Notes (Signed)
Physical Therapy Session Note  Patient Details  Name: Arthur [name] MRN: [id number] Date of Birth: [birth year]  Today's Date: 09/22/2019 PT Individual Time: 1030-1055 PT Individual Time Calculation (min): 25 min   Short Term Goals: Week 2:  PT Short Term Goal 1 (Week 2): Pt will ambulate 100 ft with LRAD and min assist PT Short Term Goal 1 - Progress (Week 2): Met PT Short Term Goal 2 (Week 2): Pt will ascend/descend 8 steps with rails and min assist PT Short Term Goal 2 - Progress (Week 2): Met PT Short Term Goal 3 (Week 2): Pt will perform car transfer with LRAD CGA PT Short Term Goal 3 - Progress (Week 2): Progressing toward goal Week 3:  PT Short Term Goal 1 (Week 3): STG=LTG due to LOS  Skilled Therapeutic Interventions/Progress Updates:   Received pt supine in bed, pt agreeable to therapy, and denied any pain throughout session. Session focused on functional mobility/transfers, LE strength, balance/coordination, ambulation, and improved endurance with activity. Pt requested water. Therapist provided water and supervision with pt seated EOB. Pt required verbal cues for small slow sips. Pt donned pull over shirt min A and pants mod A in sitting and mod A in standing to pull over hips. PT donned shoes max A for time management. Pt performed stand<>pivot with RW min A from bed<>WC. Pt transported to gym in [facility] for time management purposes. Pt ambulated 29f with RW mod A. Pt continues to demonstrate mild LE scissoring and LOB with turns, however when ambulating straight requires only min A. Pt reported he is unable to see out of his R eye, making ambulation more difficult. [facility] without AD CGA. Concluded session with pt supine in bed, needs within reach, and bed alarm on.   Therapy Documentation Precautions:  Precautions Precautions: Fall Precaution Comments: PEG, foley, visual field loss Rt eye Restrictions Weight Bearing  Restrictions: No   Therapy/Group: Individual Therapy AAlfonse AlpersPT, DPT   09/22/2019, 7:32 AM

## 2019-09-22 NOTE — Progress Notes (Signed)
[facility] [place] PHYSICAL MEDICINE & REHABILITATION PROGRESS NOTE   Subjective/Complaints: No complaints this morning.  Denies pain. Does feel constipated.  Dizziness improved in therapy.   ROS: Denies CP, SOB, N/V/D  Objective:   No results found. No results for input(s): WBC, HGB, HCT, PLT in the last 72 hours. Recent Labs    09/20/19 0445  NA 135  K 4.6  CL 105  CO2 20*  GLUCOSE 78  BUN 13  CREATININE 0.86  CALCIUM 8.9    Intake/Output Summary (Last 24 hours) at 09/22/2019 1334 Last data filed at 09/22/2019 1003 Gross per 24 hour  Intake 240 ml  Output 1500 ml  Net -1260 ml     Physical Exam: Vital Signs Blood pressure 100/61, pulse (!) 57, temperature 98.1 F (36.7 C), resp. rate 15, weight 54.4 kg, SpO2 99 %.  Constitutional: No distress . Vital signs reviewed.  + Cachectic. HENT: Normocephalic.  Atraumatic. Eyes: EOMI. No discharge. Cardiovascular: No JVD. Respiratory: Normal effort.  No stridor. GI: Non-distended.  + PEG. Skin: Warm and dry.  Intact. Psych: Normal mood.  Normal behavior. Musc: No edema in extremities.  No tenderness in extremities. Neurological: Alert Dysarthria, unchanged Motor: Grossly 4/5 throughout, stable  Assessment/Plan: 1. 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  Physiatrist is providing close team supervision and 24 hour management of active medical problems listed below.  Physiatrist and rehab team continue to assess barriers to discharge/monitor patient progress toward functional and medical goals  Care Tool:  Bathing  Bathing activity did not occur: Refused Body parts bathed by patient: Right arm, Left arm, Chest, Abdomen, Front perineal area, Right upper leg, Left upper leg, Face   Body parts bathed by helper: Buttocks, Right lower leg, Left lower leg     Bathing assist Assist Level: Minimal Assistance -  Patient > 75%     Upper Body Dressing/Undressing Upper body dressing   What is the patient wearing?: Pull over shirt    Upper body assist Assist Level: Minimal Assistance - Patient > 75%    Lower Body Dressing/Undressing Lower body dressing      What is the patient wearing?: Pants     Lower body assist Assist for lower body dressing: Moderate Assistance - Patient 50 - 74%     Toileting Toileting Toileting Activity did not occur Landscape architect and hygiene only): Refused  Toileting assist Assist for toileting: Moderate Assistance - Patient 50 - 74% Assistive Device Comment: bedside commode   Transfers Chair/bed transfer  Transfers assist     Chair/bed transfer assist level: Minimal Assistance - Patient > 75%     Locomotion Ambulation   Ambulation assist      Assist level: Moderate Assistance - Patient 50 - 74% Assistive device: Walker-rolling Max distance: 63ft   Walk 10 feet activity   Assist     Assist level: Moderate Assistance - Patient - 50 - 74% Assistive device: Walker-rolling   Walk 50 feet activity   Assist    Assist level: Moderate Assistance - Patient - 50 - 74% Assistive device: Walker-rolling    Walk 150 feet activity   Assist Walk 150 feet activity did not occur: Safety/medical concerns  Assist level: (per report)      Walk 10 feet on uneven surface  activity   Assist Walk 10 feet on uneven surfaces activity did not occur: Safety/medical concerns   Assist level: Moderate Assistance -  Patient - 50 - 74% Assistive device: Photographer Will patient use wheelchair at discharge?: Yes Type of Wheelchair: Manual    Wheelchair assist level: Supervision/Verbal cueing Max wheelchair distance: 131ft    Wheelchair 50 feet with 2 turns activity    Assist        Assist Level: Supervision/Verbal cueing   Wheelchair 150 feet activity     Assist      Assist Level:  Supervision/Verbal cueing   Blood pressure 100/61, pulse (!) 57, temperature 98.1 F (36.7 C), resp. rate 15, weight 54.4 kg, SpO2 99 %.  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 Antithrombotics: -DVT/anticoagulation:Pharmaceutical:Lovenox -antiplatelet therapy: N/A 3. Pain Management:Tramadol or Tylenol prn for abdominal pain. 4. Mood:LCSW to follow for evaluation and support -antipsychotic agents: N/A 5. Neuropsych: This patientis not fullycapable of making decisions on hisown behalf. 6. Skin/Wound Care:routine pressure relief measures 7. Fluids/Electrolytes/Nutrition: 8. 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 10. Malnutrition/Severe dysphagia: see above.  PEG placed on 09/02/2019  Unable to tolerate bolus tube feeds, converted back to continuous.  DCed tube feeds  D2 nectars started on 12/19, double portions ordered for meals, eating well  Freewater protocol  12/27: Thickener ordered.  11. Hypomagnesemia: Supplement as needed. added supplement thru peg, increased on 12/18.   Mag 1.7 on 12/25  Continue to monitor 12.  Hypotension  Labile and soft, but asymptomatic on 12/25  Midodrine increased to 5 TID on 09/11/2019, communicated with pharmacy 13.  Hyponatremia  Sodium 135 on 12/25  Continue to monitor 14.  Leukopenia-secondary to medications  WBCs 2.4 on 12/21, labs ordered for Monday  Continue to monitor 15.  Acute blood loss anemia  Hemoglobin 11.0 on 12/21, labs ordered for Monday  Continue to monitor 16.  Slow transit constipation  Bowel meds increased on 12/16  12/27: Added daily Miralax 17. Dizziness: 12/26: limiting therapy sessions and ability to perform ADLs. Occurs with standing, which  may be related to hypotension (patient is still at 90s systolic at rest--he is currently on Midodrine and we can consider increasing dose. Also may have vestibular component as is worse when patient moves head side to side. Will start Meclizine standing to see if this provides relief during therapy sessions; I am doubtful patient would ask for medication of prescribed prn.   12/27: Dizziness improved in therapy.   LOS: 15 days A FACE TO FACE EVALUATION WAS PERFORMED  Clint Bolder P Elio Haden 09/22/2019, 1:34 PM

## 2019-09-23 ENCOUNTER — Inpatient Hospital Stay (HOSPITAL_COMMUNITY): Payer: Self-pay

## 2019-09-23 ENCOUNTER — Inpatient Hospital Stay (HOSPITAL_COMMUNITY): Payer: Self-pay | Admitting: Speech Pathology

## 2019-09-23 ENCOUNTER — Inpatient Hospital Stay (HOSPITAL_COMMUNITY): Payer: Self-pay | Admitting: Occupational Therapy

## 2019-09-23 LAB — BASIC METABOLIC PANEL
Anion gap: 9 (ref 5–15)
BUN: 7 mg/dL (ref 6–20)
CO2: 22 mmol/L (ref 22–32)
Calcium: 8.6 mg/dL — ABNORMAL LOW (ref 8.9–10.3)
Chloride: 104 mmol/L (ref 98–111)
Creatinine, Ser: 0.79 mg/dL (ref 0.61–1.24)
GFR calc Af Amer: >60 mL/min (ref >60)
GFR calc non Af Amer: >60 mL/min (ref >60)
Glucose, Bld: 88 mg/dL (ref 70–99)
Potassium: 3.9 mmol/L (ref 3.5–5.1)
Sodium: 135 mmol/L (ref 135–145)

## 2019-09-23 LAB — CBC
HCT: 32.9 % — ABNORMAL LOW (ref 39.0–52.0)
Hemoglobin: 11.3 g/dL — ABNORMAL LOW (ref 13.0–17.0)
MCH: 33.1 pg (ref 26.0–34.0)
MCHC: 34.3 g/dL (ref 30.0–36.0)
MCV: 96.5 fL (ref 80.0–100.0)
Platelets: 305 10*3/uL (ref 150–400)
RBC: 3.41 MIL/uL — ABNORMAL LOW (ref 4.22–5.81)
RDW: 18 % — ABNORMAL HIGH (ref 11.5–15.5)
WBC: 3.4 10*3/uL — ABNORMAL LOW (ref 4.0–10.5)
nRBC: 0 % (ref 0.0–0.2)

## 2019-09-23 NOTE — Progress Notes (Signed)
[facility] PHYSICAL MEDICINE & REHABILITATION PROGRESS NOTE   Subjective/Complaints: Patient seen sitting up in bed this morning.  He indicates he slept well overnight.  He indicates that he would begin.  ROS: Denies CP, SOB, N/V/D  Objective:   No results found. Recent Labs    09/23/19 1045  WBC 3.4*  HGB 11.3*  HCT 32.9*  PLT 305   Recent Labs    09/23/19 1045  NA 135  K 3.9  CL 104  CO2 22  GLUCOSE 88  BUN 7  CREATININE 0.79  CALCIUM 8.6*    Intake/Output Summary (Last 24 hours) at 09/23/2019 1413 Last data filed at 09/23/2019 1200 Gross per 24 hour  Intake 1020 ml  Output 800 ml  Net 220 ml     Physical Exam: Vital Signs Blood pressure (!) 87/57, pulse 70, temperature 98.2 F (36.8 C), resp. rate 18, weight 56.5 kg, SpO2 98 %.  Constitutional: No distress . Vital signs reviewed.  + Cachectic. HENT: Normocephalic.  Atraumatic. Eyes: EOMI. No discharge. Cardiovascular: No JVD. Respiratory: Normal effort.  No stridor. GI: Non-distended.  + PEG. Skin: Warm and dry.  Intact. Psych: Normal mood.  Normal behavior. Musc: No edema in extremities.  No tenderness in extremities. Neurological: Alert Dysarthria, stable Motor: Grossly 4-4+/5 throughout  Assessment/Plan: 1. 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  Physiatrist is providing close team supervision and 24 hour management of active medical problems listed below.  Physiatrist and rehab team continue to assess barriers to discharge/monitor patient progress toward functional and medical goals  Care Tool:  Bathing  Bathing activity did not occur: Refused Body parts bathed by patient: Right arm, Left arm, Chest, Abdomen, Front perineal area, Right upper leg, Left upper leg, Face   Body parts bathed by helper: Buttocks, Right lower leg, Left lower leg     Bathing assist Assist Level:  Minimal Assistance - Patient > 75%     Upper Body Dressing/Undressing Upper body dressing   What is the patient wearing?: Pull over shirt    Upper body assist Assist Level: Minimal Assistance - Patient > 75%    Lower Body Dressing/Undressing Lower body dressing      What is the patient wearing?: Pants     Lower body assist Assist for lower body dressing: Moderate Assistance - Patient 50 - 74%     Toileting Toileting Toileting Activity did not occur Landscape architect and hygiene only): Refused  Toileting assist Assist for toileting: Moderate Assistance - Patient 50 - 74% Assistive Device Comment: bedside commode   Transfers Chair/bed transfer  Transfers assist     Chair/bed transfer assist level: Contact Guard/Touching assist     Locomotion Ambulation   Ambulation assist      Assist level: Minimal Assistance - Patient > 75% Assistive device: Walker-rolling Max distance: 60 ft   Walk 10 feet activity   Assist     Assist level: Minimal Assistance - Patient > 75% Assistive device: Walker-rolling   Walk 50 feet activity   Assist    Assist level: Minimal Assistance - Patient > 75% Assistive device: Walker-rolling    Walk 150 feet activity   Assist Walk 150 feet activity did not occur: Safety/medical concerns  Assist level: (per report)      Walk 10 feet on uneven surface  activity   Assist Walk 10 feet on uneven surfaces activity did not occur: Safety/medical concerns  Assist level: Moderate Assistance - Patient - 50 - 74% Assistive device: Photographer Will patient use wheelchair at discharge?: Yes Type of Wheelchair: Manual    Wheelchair assist level: Supervision/Verbal cueing Max wheelchair distance: 155'    Wheelchair 50 feet with 2 turns activity    Assist        Assist Level: Supervision/Verbal cueing   Wheelchair 150 feet activity     Assist      Assist Level:  Supervision/Verbal cueing   Blood pressure (!) 87/57, pulse 70, temperature 98.2 F (36.8 C), resp. rate 18, weight 56.5 kg, SpO2 98 %.  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 Antithrombotics: -DVT/anticoagulation:Pharmaceutical:Lovenox -antiplatelet therapy: N/A 3. Pain Management:Tramadol or Tylenol prn for abdominal pain. 4. Mood:LCSW to follow for evaluation and support -antipsychotic agents: N/A 5. Neuropsych: This patientis not fullycapable of making decisions on hisown behalf. 6. Skin/Wound Care:routine pressure relief measures 7. Fluids/Electrolytes/Nutrition: 8. 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 10. Malnutrition/Severe dysphagia: see above.  PEG placed on 09/02/2019  Unable to tolerate bolus tube feeds, converted back to continuous.  DCed tube feeds  D2 nectars started on 12/19, double portions ordered for meals, eating well  Freewater protocol 11. Hypomagnesemia: Supplement as needed. added supplement thru peg, increased on 12/18.   Mag 1.7 on 12/25  Continue to monitor 12.  Hypotension  Soft, asymptomatic on 12/28  Midodrine increased to 5 TID on 09/11/2019, communicated with pharmacy 13.  Hyponatremia  Sodium 135 on 12/28  Continue to monitor 14.  Leukopenia-secondary to medications  WBCs 3.4 on 12/28  Continue to monitor 15.  Acute blood loss anemia  Hemoglobin 11.3 on 12/28  Continue to monitor 16.  Slow transit constipation  Bowel meds increased on 12/16  Added MiraLAX 17. Dizziness:   Started meclizine to see if this provides relief during therapy sessions   Improving  LOS: 16 days A FACE TO FACE EVALUATION WAS PERFORMED  Arthur [name] Arthur [name] 09/23/2019, 2:13 PM

## 2019-09-23 NOTE — Progress Notes (Signed)
Nutrition Follow-up  DOCUMENTATION CODES:   Underweight, Severe malnutrition in context of chronic illness  INTERVENTION:   - Double protein portions TID with meals  - HS snack daily  - Continue MVI daily  - Encourage continued adequate PO intake  NUTRITION DIAGNOSIS:   Severe Malnutrition related to chronic illness (HIV, dysphagia) as evidenced by severe fat depletion, severe muscle depletion, percent weight loss (19.2% weight loss in 3 months).  Ongoing, being addressed via diet advancement and oral nutrition supplements  GOAL:   Patient will meet greater than or equal to 90% of their needs  Progressing  MONITOR:   PO intake, Supplement acceptance, Diet advancement, Labs, Weight trends, TF tolerance, Skin, I & O's  REASON FOR ASSESSMENT:   Consult Enteral/tube feeding initiation and management  ASSESSMENT:   38 year old male with PMH of HIV, substance abuse, depression, GERD. Pt presented to ED on 07/28/19 with dizziness, weakness, and intermittent headaches. Pt had been noncompliant with his HIV drugs due to lack of insurance/affordibility issues. MRI brain done revealing widespread areas of diffusion abnormal contrast in both cerebral hemispheres predominantly left anterior frontal matter concerning for opportunistic infection as well as premature generalized atrophy. 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 Palliative care consulted to discuss [place] and mother elected on aggressive treatment.  He has been maintained on tube feeds with NPO due to severe dysphagia with difficulty handing oral secretions. PEG placed on 12/07. Pt admitted to CIR on 09/07/19.  12/18 - MBSS with diet advanced to Dysphagia 2, nectar-thick liquids 12/22 - TF d/c  Noted plan for d/c on 09/27/19. Noted plan to repeat MBSS later this week.  PEG remains in place and is being  utilized for free water flushes.  Weight up 11 lbs since admit to CIR. Weight gain is favorable in this pt given severe malnutrition.  Spoke with pt at bedside. Pt reports he does not like the [employer] shakes. RD will remove this order from [place]. Will order double protein portions as well as an HS snack.  Meal Completion: 85-100% x last 8 meals (mostly 100%)  Medications reviewed and include: Pepcid, free water 100 ml q 8 hours, magnesium oxide, liquid MVI, Miralax, senna  Labs reviewed.  UOP: 1200 ml x 24 hours  Diet Order:   Diet Order            DIET DYS 2 Room service appropriate? Yes; Fluid consistency: Nectar Thick  Diet effective now              EDUCATION NEEDS:   No education needs have been identified at this time  Skin:  Skin Assessment: Skin Integrity Issues: Skin Integrity Issues: Stage II: sacrum  Last BM:  09/23/19 type 3  Height:   Ht Readings from Last 1 Encounters:  07/29/19 5\' 11"  (1.803 m)    Weight:   Wt Readings from Last 1 Encounters:  09/23/19 56.5 kg    Ideal Body Weight:  78.2 kg  BMI:  Body mass index is 17.37 kg/m.  Estimated Nutritional Needs:   Kcal:  2200-2400  Protein:  105-120 grams  Fluid:  >/= 1.8 L    Gaynell Face, MS, RD, LDN Inpatient Clinical Dietitian Pager: [phone] Weekend/After Hours: [phone]

## 2019-09-23 NOTE — Progress Notes (Signed)
Occupational Therapy Session Note  Patient Details  Name: Arthur [name] MRN: [id number] Date of Birth: April 18, 1981  Today's Date: 09/23/2019 OT Individual Time: 5638-7564 OT Individual Time Calculation (min): 78 min    Short Term Goals: Week 2:  OT Short Term Goal 1 (Week 2): Pt will don footwear with Mod A OT Short Term Goal 2 (Week 2): Pt will elevate pants over hips with no more than Min A for standing balance OT Short Term Goal 3 (Week 2): Pt will complete 3/3 toileting tasks with Min A overall  Skilled Therapeutic Interventions/Progress Updates:    Patient in bed, alert and agreeable to participated in therapy session.  Bed mobility with CS.  Donn shoes with mod A,.  OH shirt min A.  Short distance ambulation and SPT to/from bed, w/c, mat table with hand hold assist and min A for balance/trunk control - cues for postural control.   Completed dynavision activity for visual motor and coordination challenge:   Whole screen for 2 minutes   Seated R UE = 4.62 sec   Seated L UE = 6.00 sec   Stance R UE = 4.00 sec   Stance L UE = 4.29 sec  Completed unsupported sitting activities on mat with focus on stretching of proximal musculature, core mobility/posture, seated balance and coordination. Completed standing balance and coordination activities (min A to maintain balance and cues for postural control) Reviewed and practiced tub transfer bench in tub/shower - able to complete with min A for balance after initial instruction.   Completed ambulation/balance activity pushing w/c - mod A to maintain balance and cues for position in space.   He returned to bed at close of session, bed alarm set and call bell in reach.    Therapy Documentation Precautions:  Precautions Precautions: Fall Precaution Comments: PEG, foley, visual field loss Rt eye Restrictions Weight Bearing Restrictions: No General:   Vital Signs:  Pain: Pain Assessment Pain Scale: 0-10 Pain Score: 0-No  pain   Therapy/Group: Individual Therapy  Carlos Levering 09/23/2019, 2:17 PM

## 2019-09-23 NOTE — Progress Notes (Signed)
Physical Therapy Session Note  Patient Details  Name: Arthur [name] MRN: [id number] Date of Birth: 21-Sep-1981  Today's Date: 09/23/2019 PT Individual Time: 1130-1156 PT Individual Time Calculation (min): 26 min   Short Term Goals: Week 3:  PT Short Term Goal 1 (Week 3): STG=LTG due to LOS  Skilled Therapeutic Interventions/Progress Updates:    Pt supine in bed upon PT arrival, agreeable to therapy tx and reports pain 5/10 in LE s, limiting ambulation activity this session for pain relief secondary to pt request. Pt transferred to sitting EOB with supervision and donned pants with supervision, donned shoes with assist, sit<>stand with CGA to pull pants over hips and stand pivot to w/c with CGA. Pt propelled w/c x 50 ft using B UEs with supervision. In the dayroom pt performed the following activities working on standing balance and tolerance: standing with RW toe taps on aerobic step x 2 trials, standing without UE support horseshoe toss activity x 2 trials, standing with single UE support while performing sidesteps in place x 10 per LE. Pt ambulated x 60 ft this session with RW and min assist, cues for foot placement and safety. Pt transported back to room and transferred to bed CGA, left supine with needs in reach and bed alarm set.   Therapy Documentation Precautions:  Precautions Precautions: Fall Precaution Comments: PEG, foley, visual field loss Rt eye Restrictions Weight Bearing Restrictions: No    Therapy/Group: Individual Therapy  Netta Corrigan, PT, DPT, CSRS 09/23/2019, 8:01 AM

## 2019-09-23 NOTE — Progress Notes (Signed)
Social Work Patient ID: Arthur [name], male   DOB: [birth year], 38 y.o.   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  A walker  will not resolve issue with performing activities of daily living.  A wheelchair will allow patient to safely perform daily activities.  Patient is not able to propel themselves in the home using a standard weight wheelchair due to fatigue and endurance .  Patient can self propel in the lightweight wheelchair.

## 2019-09-23 NOTE — Progress Notes (Signed)
Speech Language Pathology Weekly Progress and Session Note  Patient Details  Name: Arthur [name] MRN: [id number] Date of Birth: 1980-12-31  Beginning of progress report period: September 16, 2019 End of progress report period: September 23, 2019  Today's Date: 09/23/2019 SLP Individual Time: 0815-0900 SLP Individual Time Calculation (min): 45 min  Short Term Goals: Week 2: SLP Short Term Goal 1 (Week 2): Pt will consume current Dys 2/nectar diet with Min overt s/sx aspiration and efficient mastication and oral clearance with Mod A verbal cues for use of swallow strategies. SLP Short Term Goal 1 - Progress (Week 2): Met SLP Short Term Goal 2 (Week 2): Pt will consume therapeutic trials of ice chips with Min overt s/sx aspiration to determine readiness or necessecity for another instrumental swallow evaluation. SLP Short Term Goal 2 - Progress (Week 2): Met SLP Short Term Goal 3 (Week 2): Pt will increase vocal intensity and overarticulate to achieve speech intelligibility at the word level with Min assist verbal cues. SLP Short Term Goal 3 - Progress (Week 2): Met SLP Short Term Goal 4 (Week 2): Pt will complete mildly complex tasks with Supervision assist verbal cues for functional problem solving. SLP Short Term Goal 4 - Progress (Week 2): Progressing toward goal SLP Short Term Goal 5 (Week 2): Pt will use external aids for recall of daily information with Min assist verbal and visual cues. SLP Short Term Goal 5 - Progress (Week 2): Met    New Short Term Goals: Week 3: SLP Short Term Goal 1 (Week 3): STG=LTG due to remaining length of stay  Weekly Progress Updates: Pt has made functional gains and met 4 out of 5 short term goals. Pt is currently Min assist for use of swallow and speech intelligibility strategies as well as semi-complex cognitive tasks due to dysphagia, dysarthria, and cognitive impairments impacting problem solving, short term memory, and awareness. Pt is consuming Dys 2  diet with nectar thick liquids; solids were advanced over the last week as well as initiation of free water protocol. Pt has been doing well, exhibiting minimal overt s/sx aspiration with thin H2O trials, therefore MBSS will be repeated this week to assess readiness for advancement. Pt is also participating in Dys 3 trials. Pt has demonstrated improved speech intelligibility, mastication and oral clearance, as well as mildly complex problem solving. Pt education is ongoing and pt's mother is scheduled to come in for ST education later this week prior to d/c. Pt would continue to benefit from skilled ST while inpatient in order to maximize functional independence and reduce burden of care prior to discharge. Anticipate that pt will need 24/7 supervision at discharge in addition to [place] follow up at next level of care.      Intensity: Minumum of 1-2 x/day, 30 to 90 minutes Frequency: 3 to 5 out of 7 days Duration/Length of Stay: 09/27/19 Treatment/Interventions: Cognitive remediation/compensation;Dysphagia/aspiration precaution training;Internal/external aids;Cueing hierarchy;Environmental controls;Functional tasks;Patient/family education;Speech/Language facilitation   Daily Session  Skilled Therapeutic Interventions: Pt was seen for skilled ST targeting dysphagia and cognitive goals. During skilled observation of pt consuming Dys 2 breakfast tray with nectar thick liquids, 1 delayed cough was noted. Pt also exhibited impaired mastication with suspected decreased bolus cohesion due to generalized oral weakness and missing dentition. Pt required Min A verbal cues in order to implement slow rate throughout meal. During upgraded trial of Dys 3 solid, pt exhibited complete oral clearance, but slightly prolonged mastication similar to presentation with Dys 2. Recommend continue current diet  with additional trial of Dys 3 to assess readiness for solid advancement. Follow up MBSS also schedule for tomorrow in order  to assess readiness for liquid advancement. SLP further facilitated session with Min A verbal cues for organization, problem solving, and error awareness a semi-complex monthly calendar/scheduling task. During structured task to target cognitive goals, pt was minimally verbal and less engaged than in previous sessions with this SLP. Pt left laying in bed with alarm set and all needs within reach. Continue per current plan of care.           Pain Pain Assessment Pain Scale: 0-10 Pain Score: 0-No pain  Therapy/Group: Individual Therapy  Arbutus Leas 09/23/2019, 8:56 AM

## 2019-09-23 NOTE — Plan of Care (Signed)
  Problem: Consults Goal: RH GENERAL PATIENT EDUCATION Description: See Patient Education module for education specifics. Outcome: Progressing   Problem: RH BOWEL ELIMINATION Goal: RH STG MANAGE BOWEL WITH ASSISTANCE Description: STG Manage Bowel with min Assistance. Outcome: Progressing   Problem: RH BLADDER ELIMINATION Goal: RH STG MANAGE BLADDER WITH ASSISTANCE Description: STG Manage Bladder With min Assistance Outcome: Progressing   Problem: RH SKIN INTEGRITY Goal: RH STG SKIN FREE OF INFECTION/BREAKDOWN Description: No new skin breakdown Outcome: Progressing Goal: RH STG MAINTAIN SKIN INTEGRITY WITH ASSISTANCE Description: STG Maintain Skin Integrity With min Assistance. Outcome: Progressing Goal: RH STG ABLE TO PERFORM INCISION/WOUND CARE W/ASSISTANCE Description: STG Able To Perform Incision/Wound Care With min Assistance. Outcome: Progressing   Problem: RH SAFETY Goal: RH STG ADHERE TO SAFETY PRECAUTIONS W/ASSISTANCE/DEVICE Description: STG Adhere to Safety Precautions With min Assistance/Device. Outcome: Progressing   Problem: RH KNOWLEDGE DEFICIT GENERAL Goal: RH STG INCREASE KNOWLEDGE OF SELF CARE AFTER HOSPITALIZATION Outcome: Progressing   

## 2019-09-23 NOTE — Progress Notes (Signed)
Physical Therapy Session Note  Patient Details  Name: Arthur [name] MRN: [id number] Date of Birth: 02/10/1981  Today's Date: 09/23/2019 PT Individual Time: 0930-1025 PT Individual Time Calculation (min): 55 min   Short Term Goals: Week 3:  PT Short Term Goal 1 (Week 3): STG=LTG due to LOS  Skilled Therapeutic Interventions/Progress Updates:     Patient in bed asleep upon PT arrival. Patient easily aroused to verbal stimuli and agreeable to PT session. Patient denied pain during session. Reported that he did not sleep well last night and that he was very tired today. Also reported that his leg "feel heavy" today while ambulating, denied any additional symptoms, RN made aware.  Therapeutic Activity: Bed Mobility: Patient performed supine to/from sit with supervision with use of hospital bed funtions. Provided verbal cues for pushing up from the bed rather than bed rails. patietn requested to have some water sitting EOB. Performed oral care with set up assist and cues for cleaning all areas of his mouth. Drank several small sips of water, provided cues for small sips, swallowing completely, and clearing his throat when he coughed x1. Transfers: Patient performed sit to/from stand x3, a toilet transfers to [facility], and stand pivot x2 with min A using a RW. Provided verbal cues for hand placement on RW and reaching back to sit to control descent for safety. Patient was continent of bowl on BSC in the room. Required max-total A for peri-care and LB dressing during toileting.  Wheelchair Mobility:  Patient propelled wheelchair 155 feet and 85 feet with supervision using B LEs for strength/endurance and functional locomotion. Provided verbal cues for use of hand to assist with turns, avoiding objects on his L x3, and stepping with space between his feet for NMR to transfer to wider BOS with gait training.   Neuromuscular Re-ed: Patient performed gait training 18 feet x2 with visual feedback from lines  on tiles for foot placement 1 foot apart and small cones place for the patient to ambulate with cones between his feet without hitting them. He hit one cone due to mild adduction of L foot during the first tiral and no cones on the second trial. Patient reported increase LE "heaviness" after, stated he had not felt this before. Also, reported that he needed to void and was incontinent of bladder before he was transported back to his room. Educated on safety with urinary urgency at home and use of BSC to prevent falls. Patient receptive to education.   Patient in bed, requested to go back to sleep after session, at end of session with breaks locked, bed alarm set, and all needs within reach.    Therapy Documentation Precautions:  Precautions Precautions: Fall Precaution Comments: PEG, foley, visual field loss Rt eye Restrictions Weight Bearing Restrictions: No    Therapy/Group: Individual Therapy  Aleigha Gilani L Jaiana Sheffer PT, DPT  09/23/2019, 10:30 AM

## 2019-09-24 ENCOUNTER — Inpatient Hospital Stay (HOSPITAL_COMMUNITY): Payer: Self-pay | Admitting: Occupational Therapy

## 2019-09-24 ENCOUNTER — Inpatient Hospital Stay (HOSPITAL_COMMUNITY): Payer: Medicaid Other

## 2019-09-24 ENCOUNTER — Telehealth: Payer: Self-pay | Admitting: *Deleted

## 2019-09-24 ENCOUNTER — Inpatient Hospital Stay (HOSPITAL_COMMUNITY): Payer: Self-pay

## 2019-09-24 ENCOUNTER — Inpatient Hospital Stay (HOSPITAL_COMMUNITY): Payer: Self-pay | Admitting: Physical Therapy

## 2019-09-24 ENCOUNTER — Encounter (HOSPITAL_COMMUNITY): Payer: Self-pay

## 2019-09-24 NOTE — Progress Notes (Signed)
   09/24/19 1613  What Happened  Was fall witnessed? No  Was patient injured? No  Patient found on floor  Found by Staff-comment Janett Billow, CNA)  Stated prior activity other (comment) (sitting on edge of bed)  Follow Up  MD notified Pam, PA  Time MD notified [phone]  Family notified Yes - comment (Mom in room when fall happened)  Time family notified 1614  Additional tests No  Simple treatment Other (comment) (no new orders)  Progress note created (see row info) Yes  Adult Fall Risk Assessment  Risk Factor Category (scoring not indicated) Not Applicable  Age 38  Fall History: Fall within 6 months prior to admission 0  Elimination; Bowel and/or Urine Incontinence 0  Elimination; Bowel and/or Urine Urgency/Frequency 2  Medications: includes PCA/Opiates, Anti-convulsants, Anti-hypertensives, Diuretics, Hypnotics, Laxatives, Sedatives, and Psychotropics 5  Patient Care Equipment 2  Mobility-Assistance 2  Mobility-Gait 2  Mobility-Sensory Deficit 0  Altered awareness of immediate physical environment 0  Impulsiveness 0  Lack of understanding of one's physical/cognitive limitations 0  Total Score 13  Patient Fall Risk Level Moderate fall risk  Adult Fall Risk Interventions  Required Bundle Interventions *See Row Information* Moderate fall risk - low and moderate requirements implemented  Additional Interventions Use of appropriate toileting equipment (bedpan, BSC, etc.);Family Supervision;Lap belt while in chair/wheelchair  Screening for Fall Injury Risk (To be completed on HIGH fall risk patients) - Assessing Need for Low Bed  Risk For Fall Injury- Low Bed Criteria None identified - Continue screening  Will Implement Low Bed and Floor Mats No - Criteria no longer met for low bed  Specialty Low Bed Contraindicated Hemodynamically unstable  Screening for Fall Injury Risk (To be completed on HIGH fall risk patients who do not meet crieteria for Low Bed) - Assessing Need for Floor Mats  Only  Risk For Fall Injury- Criteria for Floor Mats None identified - No additional interventions needed  Will Implement Floor Mats Yes  Pain Assessment  Pain Scale 0-10  Pain Score 0  Neurological  Neuro (WDL) X  Level of Consciousness Alert  Orientation Level Oriented X4  Cognition Appropriate at baseline;Follows commands  Speech Slurred/Dysarthria  Pupil Assessment  No  R Hand Grip Moderate  L Hand Grip Moderate   R Foot Dorsiflexion Moderate  L Foot Dorsiflexion Moderate  R Foot Plantar Flexion Weak  L Foot Plantar Flexion Weak  RUE Motor Response Purposeful movement  LUE Motor Response Purposeful movement  RLE Motor Response Purposeful movement  LLE Motor Response Purposeful movement  Neuro Symptoms None  Musculoskeletal  Musculoskeletal (WDL) X  Assistive Device Wheelchair  Generalized Weakness Yes  Weight Bearing Restrictions No  Integumentary  Integumentary (WDL) X  Skin Color Appropriate for ethnicity  Skin Condition Dry  Skin Integrity Cracking;Catheter entry/exit site  Abrasion Location Knee  Abrasion Location Orientation Left  Abrasion Intervention Other (Comment) (assessed)  Catheter Entry/Exit Location Abdomen  Catheter Entry/Exit Location Orientation Left;Upper  Catheter Entry/Exit Intervention Other (Comment) (assessed)  Cracking Location Foot  Cracking Location Orientation Bilateral  Cracking Intervention Other (Comment) (antifungal cream)

## 2019-09-24 NOTE — Plan of Care (Signed)
  Problem: Consults Goal: RH GENERAL PATIENT EDUCATION Description: See Patient Education module for education specifics. Outcome: Progressing   Problem: RH BOWEL ELIMINATION Goal: RH STG MANAGE BOWEL WITH ASSISTANCE Description: STG Manage Bowel with min Assistance. Outcome: Progressing   Problem: RH BLADDER ELIMINATION Goal: RH STG MANAGE BLADDER WITH ASSISTANCE Description: STG Manage Bladder With min Assistance Outcome: Progressing   Problem: RH SKIN INTEGRITY Goal: RH STG SKIN FREE OF INFECTION/BREAKDOWN Description: No new skin breakdown Outcome: Progressing Goal: RH STG MAINTAIN SKIN INTEGRITY WITH ASSISTANCE Description: STG Maintain Skin Integrity With min Assistance. Outcome: Progressing Goal: RH STG ABLE TO PERFORM INCISION/WOUND CARE W/ASSISTANCE Description: STG Able To Perform Incision/Wound Care With min Assistance. Outcome: Progressing   Problem: RH SAFETY Goal: RH STG ADHERE TO SAFETY PRECAUTIONS W/ASSISTANCE/DEVICE Description: STG Adhere to Safety Precautions With min Assistance/Device. Outcome: Progressing   Problem: RH KNOWLEDGE DEFICIT GENERAL Goal: RH STG INCREASE KNOWLEDGE OF SELF CARE AFTER HOSPITALIZATION Outcome: Progressing   

## 2019-09-24 NOTE — Progress Notes (Addendum)
Modified Barium Swallow Progress Note  Patient Details  Name: Arthur [name] MRN: [id number] Date of Birth: [birth year]  Today's Date: 09/24/2019  Modified Barium Swallow completed.  Full report located under Chart Review in the Imaging Section.  Brief recommendations include the following:  Clinical Impression  Pt presents with moderate pharyngeal dysphagia. Pt demonstrates adequate swallow function of solids and thin liquids via cup with small sips, intermittent throat clear/cough and utilization of chin tuck. Pt demonstrated functional oral containment, lingual movement, mastication and timely AP transfers with all assessed liquids (nectar thick liquids and thin via TSP/cup/straw) and solid textures (puree and regular). Swallow initiation was trigged in the pyriform sinuses for liquids and vallecula for solids. Pt demonstrated pharyngeal dysphagia with nectar thick liquids via cup and Thin liquids via cup/straw, resulting in silent aspiration before swallow (nectar thick liquids), as well before and during the swallow (thin liquids), due to delayed swallow and reduced sensation. Pt demonstrated penetration when consuming mixed consistencies while utilizing chin tuck, however was cleared with full laryngeal vestibule closure. Pt did demonstrate ability to clear residue from penetrated thin liquids with cued effective cough and chin tuck ensured great bolus control of thin liquids. SLP recommends regular and thin liquid via cup with utilization of small sips, chin tuck and intermittent throat clear/cough with swallow, requiring full supervision and medication whole in puree.   Swallow Evaluation Recommendations       SLP Diet Recommendations: Regular solids;Thin liquid   Liquid Administration via: Cup;No straw(chin tuck and small sips)   Medication Administration: Whole meds with puree   Supervision: Full supervision/cueing for compensatory strategies   Compensations: Minimize  environmental distractions;Slow rate;Small sips/bites;Chin tuck;Clear throat intermittently   Postural Changes: Seated upright at 90 degrees   Oral Care Recommendations: Oral care before and after PO   Other Recommendations: Have oral suction available    Yanina Knupp 09/24/2019,5:17 PM

## 2019-09-24 NOTE — Progress Notes (Signed)
Occupational Therapy Weekly Progress Note  Patient Details  Name: Arthur [name] MRN: [id number] Date of Birth: June 28, 1981  Beginning of progress report period: September 16, 2019 End of progress report period: September 24, 2019  Today's Date: 09/24/2019 OT Individual [id number] and [id number] OT Individual Time Calculation (min): 56 min and 25 min    Patient has met 3 of 3 short term goals.  Pt is making steady progress towards goals.  Pt currently requires min assist to CGA for dynamic standing balance during self-care tasks and therapeutic activities as posterior lean in standing persists.  Pt able to correct backwards LOB 75% of time, however continues to require intermittent tactile cues for posture and balance.  Pt has demonstrated ability to complete bathing and dressing, to include donning shoes with Min assist.  Continue to focus on dynamic balance and functional transfers, as pt will need to ambulate in to bathroom at home as it is not w/c accessible.  Patient continues to demonstrate the following deficits: muscle weakness, decreased cardiorespiratoy endurance, unbalanced muscle activation, ataxia and decreased coordination, Rt eye blindness, decreased problem solving, decreased safety awareness and delayed processing and decreased sitting balance, decreased standing balance, decreased postural control, hemiplegia and decreased balance strategies  and therefore will continue to benefit from skilled OT intervention to enhance overall performance with BADL and Reduce care partner burden.  Patient progressing toward long term goals..  Plan of care revisions: downgraded transfers and dynamic standing balance to CGA.  OT Short Term Goals Week 2:  OT Short Term Goal 1 (Week 2): Pt will don footwear with Mod A OT Short Term Goal 1 - Progress (Week 2): Met OT Short Term Goal 2 (Week 2): Pt will elevate pants over hips with no more than Min A for standing balance OT Short Term Goal 2 -  Progress (Week 2): Met OT Short Term Goal 3 (Week 2): Pt will complete 3/3 toileting tasks with Min A overall OT Short Term Goal 3 - Progress (Week 2): Met Week 3:  OT Short Term Goal 1 (Week 3): STGs = LTGs due to remaining LOS  Skilled Therapeutic Interventions/Progress Updates:    1) Treatment session with focus on self-care retraining with short distance ambulation and dynamic standing balance during bathing and dressing.  Pt received upright in bed finishing breakfast.  Pt agreeable to bathing at sink.  Completed bed mobility with supervision and ambulated CGA to sink with RW.  Engaged in bathing at sit > stand level with pt requiring min assist progressing to CGA for standing balance due to persistent posterior lean.  Pt required mod assist to correct balance x1 when washing buttocks.  Dressing completed with Min assist for standing balance when pulling pants over hips. Pt c/o pain in B knees with LB dressing, when crossing legs in to figure 4 - RN notified.  Pt chose to sit to complete oral care for energy conservation.  Pt drank 12 oz water with intermittent throat clear and cough x2, post brushing teeth as per water protocol. Pt remained upright in w/c with chair alarm on and all needs in reach.  2) Treatment session with focus on dynamic standing balance during table top task.  Pt received upright in w/c pleased to report that he "passed" his swallow test.  Pt encouraged to be able to eat regular textures and drink thin liquids now.  Pt propelled w/c for BUE strengthening and attention to environment, with pt nearly bumping in to items frequently on Lt.  Engaged in "jenga" in standing with focus on trunk control and BUE use.  Pt with LOB posterior, with ability to correct 75% of time with UE support on table, requiring min assist to correct x1.  Returned to room and agreeable to staying OOB through lunch.   Therapy Documentation Precautions:  Precautions Precautions: Fall Precaution Comments:  PEG, foley, visual field loss Rt eye Restrictions Weight Bearing Restrictions: No Pain: Pain Assessment Pain Scale: 0-10 Pain Score: 6  Pain Type: Acute pain Pain Location: Knee Pain Orientation: Right;Left Pain Descriptors / Indicators: Aching;Discomfort Pain Frequency: Intermittent Pain Onset: With Activity Patients Stated Pain Goal: 2 Pain Intervention(s): Medication (See eMAR)  Therapy/Group: Individual Therapy  Simonne Come 09/24/2019, 9:49 AM

## 2019-09-24 NOTE — Plan of Care (Signed)
  Problem: Sit to Stand Goal: LTG:  Patient will perform sit to stand with assistance level (PT) Description: LTG:  Patient will perform sit to stand with assistance level (PT) Flowsheets (Taken 09/24/2019 0743) LTG: PT will perform sit to stand in preparation for functional mobility with assistance level: (downgraded due to decreased balance and postural control) Contact Guard/Touching assist Note: downgraded due to decreased balance and postural control   Problem: RH Balance Goal: LTG Patient will maintain dynamic standing balance (PT) Description: LTG:  Patient will maintain dynamic standing balance with assistance during mobility activities (PT) Flowsheets (Taken 09/24/2019 0743) LTG: Pt will maintain dynamic standing balance during mobility activities with:: (downgraded due to decreased balance and postural control) Contact Guard/Touching assist Note: downgraded due to decreased balance and postural control   Problem: RH Bed to Chair Transfers Goal: LTG Patient will perform bed/chair transfers w/assist (PT) Description: LTG: Patient will perform bed to chair transfers with assistance (PT). Flowsheets (Taken 09/24/2019 0743) LTG: Pt will perform Bed to Chair Transfers with assistance level: (downgraded due to decreased balance and postural control) Contact Guard/Touching assist Note: downgraded due to decreased balance and postural control   Problem: RH Car Transfers Goal: LTG Patient will perform car transfers with assist (PT) Description: LTG: Patient will perform car transfers with assistance (PT). Flowsheets (Taken 09/24/2019 0743) LTG: Pt will perform car transfers with assist:: (downgraded due to decreased balance and postural control) Contact Guard/Touching assist Note: downgraded due to decreased balance and postural control   Problem: RH Ambulation Goal: LTG Patient will ambulate in controlled environment (PT) Description: LTG: Patient will ambulate in a controlled  environment, # of feet with assistance (PT). Flowsheets (Taken 09/24/2019 0743) LTG: Pt will ambulate in controlled environ  assist needed:: (downgraded due to decreased balance and postural control) Contact Guard/Touching assist Note: downgraded due to decreased balance and postural control Goal: LTG Patient will ambulate in home environment (PT) Description: LTG: Patient will ambulate in home environment, # of feet with assistance (PT). Flowsheets (Taken 09/24/2019 0743) LTG: Pt will ambulate in home environ  assist needed:: (downgraded due to decreased balance and postural control) Contact Guard/Touching assist Note: downgraded due to decreased balance and postural control

## 2019-09-24 NOTE — Progress Notes (Signed)
Physical Therapy Session Note  Patient Details  Name: ROCHELLE NEPHEW MRN: [id number] Date of Birth: March 24, 1981  Today's Date: 09/24/2019 PT Individual Time: 1300-1320 PT Individual Time Calculation (min): 20 min   Short Term Goals: Week 3:  PT Short Term Goal 1 (Week 3): STG=LTG due to LOS  Skilled Therapeutic Interventions/Progress Updates: Pt presented in w/c agreeable to therapy. Pt stating pain in B knees but did not want PTA to request any medication. Pt propelled w/c with BLE room to day room around nsg station for BLE strengthening. Pt then participated in gait training 63f x 2 across day room with RW and CGA. Pt noted to have narrow BOS however upon standing PTA providing tactile cues to improve hip alignment and pt noted to have decreased posterior lean during gait. Pt propelled back to room in same manner as prior and transferred to bed CGA once in room. Pt repositioned to comfort and left with bed alarm on, call bell within reach and needs met.      Therapy Documentation Precautions:  Precautions Precautions: Fall Precaution Comments: PEG, foley, visual field loss Rt eye Restrictions Weight Bearing Restrictions: No General:   Vital Signs: Therapy Vitals Temp: 98.2 F (36.8 C) Pulse Rate: 76 Resp: 17 BP: 107/74 Patient Position (if appropriate): Lying Oxygen Therapy SpO2: 100 % O2 Device: Room Air Pain: Pain Assessment Pain Scale: 0-10 Pain Score: 0-No pain   Therapy/Group: Individual Therapy  Genie Mirabal  Lorren Rossetti, PTA  09/24/2019, 4:30 PM

## 2019-09-24 NOTE — Plan of Care (Signed)
  Problem: RH Balance Goal: LTG Patient will maintain dynamic standing with ADLs (OT) Description: LTG:  Patient will maintain dynamic standing balance with assist during activities of daily living (OT)  Flowsheets (Taken 09/24/2019 1228) LTG: Pt will maintain dynamic standing balance during ADLs with: (downgraded due to slow progress) Contact Guard/Touching assist Note: downgraded due to slow progress   Problem: Sit to Stand Goal: LTG:  Patient will perform sit to stand in prep for activites of daily living with assistance level (OT) Description: LTG:  Patient will perform sit to stand in prep for activites of daily living with assistance level (OT) Flowsheets (Taken 09/24/2019 1228) LTG: PT will perform sit to stand in prep for activites of daily living with assistance level: (downgraded due to slow progress) Contact Guard/Touching assist Note: downgraded due to slow progress   Problem: RH Bathing Goal: LTG Patient will bathe all body parts with assist levels (OT) Description: LTG: Patient will bathe all body parts with assist levels (OT) Flowsheets (Taken 09/24/2019 1228) LTG: Pt will perform bathing with assistance level/cueing: (downgraded due to slow progress) Contact Guard/Touching assist Note: downgraded due to slow progress   Problem: RH Dressing Goal: LTG Patient will perform lower body dressing w/assist (OT) Description: LTG: Patient will perform lower body dressing with assist, with/without cues in positioning using equipment (OT) Flowsheets (Taken 09/24/2019 1228) LTG: Pt will perform lower body dressing with assistance level of: (downgraded due to slow progress) Contact Guard/Touching assist Note: downgraded due to slow progress   Problem: RH Toilet Transfers Goal: LTG Patient will perform toilet transfers w/assist (OT) Description: LTG: Patient will perform toilet transfers with assist, with/without cues using equipment (OT) Flowsheets (Taken 09/24/2019 1228) LTG: Pt  will perform toilet transfers with assistance level of: (downgraded due to slow progress) Contact Guard/Touching assist Note: downgraded due to slow progress and need to ambulate to toilet

## 2019-09-24 NOTE — Telephone Encounter (Signed)
Patient set for discharge from [facility] 1/1, has follow up scheduled 1/4. Will need assistance/access for medication before discharge.  Landis Gandy, RN

## 2019-09-24 NOTE — Telephone Encounter (Signed)
Arthur [name] can you help out with this or maybe CIR can hold horses  on DC him

## 2019-09-24 NOTE — Progress Notes (Addendum)
[facility] PHYSICAL MEDICINE & REHABILITATION PROGRESS NOTE   Subjective/Complaints: Patient seen sitting up in bed this morning.  He states he slept well overnight.  He asks for the urinal.  ROS: Denies CP, SOB, N/V/D  Objective:   No results found. Recent Labs    09/23/19 1045  WBC 3.4*  HGB 11.3*  HCT 32.9*  PLT 305   Recent Labs    09/23/19 1045  NA 135  K 3.9  CL 104  CO2 22  GLUCOSE 88  BUN 7  CREATININE 0.79  CALCIUM 8.6*    Intake/Output Summary (Last 24 hours) at 09/24/2019 0739 Last data filed at 09/24/2019 6387 Gross per 24 hour  Intake 600 ml  Output 2125 ml  Net -1525 ml     Physical Exam: Vital Signs Blood pressure (!) 86/62, pulse 62, temperature 98.3 F (36.8 C), temperature source Oral, resp. rate 16, weight 55.2 kg, SpO2 98 %.  Constitutional: No distress . Vital signs reviewed.  + Cachectic. HENT: Normocephalic.  Atraumatic. Eyes: EOMI. No discharge. Cardiovascular: No JVD. Respiratory: Normal effort.  No stridor. GI: Non-distended.  + PEG. Skin: Warm and dry.  Intact. Psych: Normal mood.  Normal behavior. Musc: No edema in extremities.  No tenderness in extremities. Neurological: Alert Dysarthria, improving Motor: Grossly 4-4+/5 throughout  Assessment/Plan: 1. 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  Physiatrist is providing close team supervision and 24 hour management of active medical problems listed below.  Physiatrist and rehab team continue to assess barriers to discharge/monitor patient progress toward functional and medical goals  Care Tool:  Bathing  Bathing activity did not occur: Refused Body parts bathed by patient: Right arm, Left arm, Chest, Abdomen, Front perineal area, Right upper leg, Left upper leg, Face   Body parts bathed by helper: Buttocks, Right lower leg, Left lower leg     Bathing assist  Assist Level: Minimal Assistance - Patient > 75%     Upper Body Dressing/Undressing Upper body dressing   What is the patient wearing?: Pull over shirt    Upper body assist Assist Level: Minimal Assistance - Patient > 75%    Lower Body Dressing/Undressing Lower body dressing      What is the patient wearing?: Pants     Lower body assist Assist for lower body dressing: Moderate Assistance - Patient 50 - 74%     Toileting Toileting Toileting Activity did not occur Landscape architect and hygiene only): Refused  Toileting assist Assist for toileting: Moderate Assistance - Patient 50 - 74% Assistive Device Comment: bedside commode   Transfers Chair/bed transfer  Transfers assist     Chair/bed transfer assist level: Contact Guard/Touching assist     Locomotion Ambulation   Ambulation assist      Assist level: Minimal Assistance - Patient > 75% Assistive device: Walker-rolling Max distance: 60 ft   Walk 10 feet activity   Assist     Assist level: Minimal Assistance - Patient > 75% Assistive device: Walker-rolling   Walk 50 feet activity   Assist    Assist level: Minimal Assistance - Patient > 75% Assistive device: Walker-rolling    Walk 150 feet activity   Assist Walk 150 feet activity did not occur: Safety/medical concerns  Assist level: (per report)      Walk 10 feet on uneven surface  activity   Assist Walk 10 feet on uneven surfaces activity did not occur: Safety/medical  concerns   Assist level: Moderate Assistance - Patient - 50 - 74% Assistive device: Photographer Will patient use wheelchair at discharge?: Yes Type of Wheelchair: Manual    Wheelchair assist level: Supervision/Verbal cueing Max wheelchair distance: 155'    Wheelchair 50 feet with 2 turns activity    Assist        Assist Level: Supervision/Verbal cueing   Wheelchair 150 feet activity     Assist      Assist  Level: Supervision/Verbal cueing   Blood pressure (!) 86/62, pulse 62, temperature 98.3 F (36.8 C), temperature source Oral, resp. rate 16, weight 55.2 kg, SpO2 98 %.  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 Antithrombotics: -DVT/anticoagulation:Pharmaceutical:Lovenox -antiplatelet therapy: N/A 3. Pain Management:Tramadol or Tylenol prn for abdominal pain. 4. Mood:LCSW to follow for evaluation and support -antipsychotic agents: N/A 5. Neuropsych: This patientis not fullycapable of making decisions on hisown behalf. 6. Skin/Wound Care:routine pressure relief measures 7. Fluids/Electrolytes/Nutrition: 8. 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  9. 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 10. Malnutrition/Severe dysphagia: see above.  PEG placed on 09/02/2019  Unable to tolerate bolus tube feeds, converted back to continuous.  DCed tube feeds  D2 nectars started on 12/19, double portions ordered for meals, eating well  [place] protocol 11. Hypomagnesemia: Supplement as needed. added supplement thru peg, increased on 12/18.   Mag 1.7 on 12/25  Continue to monitor 12.  Hypotension  Soft, asymptomatic on 12/29  Midodrine increased to 5 TID on 09/11/2019, communicated with pharmacy 13.  Hyponatremia  Sodium 135 on 12/28  Continue to monitor 14.  Leukopenia-secondary to medications  WBCs 3.4 on 12/28  Continue to monitor 15.  Acute blood loss anemia  Hemoglobin 11.3 on 12/28  Continue to monitor 16.  Slow transit constipation  Bowel meds increased on 12/16  Added MiraLAX  Improving 17. Dizziness:   Started meclizine to see if this provides relief during therapy sessions   Improving  LOS: 17 days A FACE TO FACE  EVALUATION WAS PERFORMED  Zeena Starkel Karis Juba 09/24/2019, 7:39 AM

## 2019-09-24 NOTE — Progress Notes (Signed)
Physical Therapy Session Note  Patient Details  Name: Arthur [name] MRN: [id number] Date of Birth: June 24, 1981  Today's Date: 09/24/2019 PT Individual Time: 4932-4199 PT Individual Time Calculation (min): 53 min   Short Term Goals: Week 2:  PT Short Term Goal 1 (Week 2): Pt will ambulate 100 ft with LRAD and min assist PT Short Term Goal 1 - Progress (Week 2): Met PT Short Term Goal 2 (Week 2): Pt will ascend/descend 8 steps with rails and min assist PT Short Term Goal 2 - Progress (Week 2): Met PT Short Term Goal 3 (Week 2): Pt will perform car transfer with LRAD CGA PT Short Term Goal 3 - Progress (Week 2): Progressing toward goal Week 3:  PT Short Term Goal 1 (Week 3): STG=LTG due to LOS  Skilled Therapeutic Interventions/Progress Updates:   Received pt supine in bed, pt agreeable to therapy, and reported bilateral knee pain during today's session, pt declined pain medication stating "pills don't help". Repositioning, exercises, and heat packs to alleviate pain. Session focused on functional mobility/transfers, ambulation, LE strength, balance/coordination, motor control/planning, and improved endurance with activity. Pt performed bed mobility with supervision. PT donned shoes mod A for time management. Pt transferred stand<>pivot bed><WC without AD CGA. Pt performed WC mobility 135f with supervision using bilateral UE and LE. Pt navigated 4 steps with 2 rails ascending and descending with a step to pattern CGA. Pt transferred squat<>pivot WC<>mat CGA and sit<>supine with supervision. Pt performed supine bridges x12 and bridges with R and L LE extended x12 with verbal cues for technique. Pt performed bilateral sidelying clamshells with orange TB x 12. Pt ambulated 80 ft with RW CGA/min A when turning. Pt demonstrated decreased posterior lean and improved RW safety awareness. Pt reported urge to urinate but was unable to make it back to room in time. In room, pt transferred sit<>stand with RW  CGA and PT doffed soiled incontinence brief and applied clean one min A. Pt able to maintain static standing balance CGA/supervision. Pt ambulated 552fwith RW to bed. Therapist assisted with providing hot packs to bilateral knees and providing pt with drinks. Pt reported significant decrease in knee pain with heat packs and was grateful to therapist. Concluded session with pt sitting EOB, needs within reach, and bed alarm on.    Therapy Documentation Precautions:  Precautions Precautions: Fall Precaution Comments: PEG, foley, visual field loss Rt eye Restrictions Weight Bearing Restrictions: No   Therapy/Group: Individual Therapy AnAlfonse AlpersT, DPT   09/24/2019, 7:41 AM

## 2019-09-25 ENCOUNTER — Ambulatory Visit (HOSPITAL_COMMUNITY): Payer: Self-pay

## 2019-09-25 ENCOUNTER — Encounter (HOSPITAL_COMMUNITY): Payer: Self-pay | Admitting: Speech Pathology

## 2019-09-25 ENCOUNTER — Inpatient Hospital Stay (HOSPITAL_COMMUNITY): Payer: Self-pay

## 2019-09-25 ENCOUNTER — Inpatient Hospital Stay (HOSPITAL_COMMUNITY): Payer: Self-pay | Admitting: Occupational Therapy

## 2019-09-25 ENCOUNTER — Inpatient Hospital Stay (HOSPITAL_COMMUNITY): Payer: Self-pay | Admitting: Speech Pathology

## 2019-09-25 DIAGNOSIS — F0280 Dementia in other diseases classified elsewhere without behavioral disturbance: Secondary | ICD-10-CM | POA: Diagnosis not present

## 2019-09-25 DIAGNOSIS — B589 Toxoplasmosis, unspecified: Secondary | ICD-10-CM | POA: Diagnosis not present

## 2019-09-25 DIAGNOSIS — R5381 Other malaise: Secondary | ICD-10-CM | POA: Diagnosis not present

## 2019-09-25 DIAGNOSIS — B20 Human immunodeficiency virus [HIV] disease: Secondary | ICD-10-CM | POA: Diagnosis not present

## 2019-09-25 MED ORDER — SULFADIAZINE 500 MG PO TABS: 1000.0000 mg | ORAL_TABLET | Freq: Two times a day (BID) | ORAL | 2 refills | Status: DC

## 2019-09-25 MED ORDER — MUSCLE RUB 10-15 % EX CREA
1.0000 "application " | TOPICAL_CREAM | CUTANEOUS | Status: DC | PRN
  Administered 2019-09-25: 1 via TOPICAL
  Filled 2019-09-25: qty 85

## 2019-09-25 MED ORDER — SULFADIAZINE 500 MG PO TABS
1000.0000 mg | ORAL_TABLET | Freq: Two times a day (BID) | ORAL | Status: DC
  Administered 2019-09-25 – 2019-09-27 (×4): 1000 mg
  Filled 2019-09-25 (×4): qty 2

## 2019-09-25 MED ORDER — SULFAMETHOXAZOLE-TRIMETHOPRIM 200-40 MG/5ML PO SUSP: 20.0000 mL | ORAL | 0 refills | Status: DC

## 2019-09-25 MED ORDER — DESCOVY 200-25 MG PO TABS: 1.0000 | ORAL_TABLET | Freq: Every day | ORAL | 2 refills | Status: DC

## 2019-09-25 MED ORDER — SULFADIAZINE 500 MG PO TABS: 1000.0000 mg | ORAL_TABLET | Freq: Two times a day (BID) | ORAL | 0 refills | Status: DC

## 2019-09-25 MED ORDER — DOLUTEGRAVIR SODIUM 50 MG PO TABS: 50.0000 mg | ORAL_TABLET | Freq: Every day | ORAL | 0 refills | Status: DC

## 2019-09-25 MED ORDER — DESCOVY 200-25 MG PO TABS: 1.0000 | ORAL_TABLET | Freq: Every day | ORAL | 0 refills | Status: DC

## 2019-09-25 MED ORDER — PYRIMETHAMINE-LEUCOVORIN 50-25 MG PO CAPS: 1.0000 | ORAL_CAPSULE | Freq: Every day | ORAL | 2 refills | Status: DC

## 2019-09-25 MED ORDER — TIVICAY 50 MG PO TABS: 50.0000 mg | ORAL_TABLET | Freq: Every day | ORAL | 2 refills | Status: DC

## 2019-09-25 NOTE — Progress Notes (Signed)
Speech Language Pathology Daily Session Note  Patient Details  Name: ROLLINS WRIGHTSON MRN: [id number] Date of Birth: 1981-06-09  Today's Date: 09/25/2019 SLP Individual Time: 1430-1500 SLP Individual Time Calculation (min): 30 min  Short Term Goals: Week 3: SLP Short Term Goal 1 (Week 3): STG=LTG due to remaining length of stay  Skilled Therapeutic Interventions: Pt was seen for skilled ST targeting eduction with pt and his mother. SLP provided verbal review of pt's regular/thin diet and emphasized importance of implementation of chin tuck with liquid consumption, use of slow rate and small bites/sips since pt is impulsive with PO intake. Also educated pt and mother regarding aspiration pneumonia to emphasize increased aspiration risk. Pt and mother verbally acknowledged and in agreement with all swallowing recommendations. Pt's mother confirmed she and other family will provide 24/7 supervision and feels confident about providing cues for safety during meals and for basic cognition. Verbal review and handout of compensatory memory strategies provided, with emphasis on establishing routines, use of note taking, needs assistance with remembering to take medications, etc. Also provided verbal review and handout for speech intelligibility strategies, which pt demonstrated use of throughout session with Supervision A verbal cues. All questions were answered to pt and mother's satisfaction. Pt left sitting in wheelchair with seatbelt alarm in place, mother still present. Continue per current plan of care.        Pain Pain Assessment Pain Scale: 0-10 Pain Score: 0-No pain  Therapy/Group: Individual Therapy  Arbutus Leas 09/25/2019, 3:04 PM

## 2019-09-25 NOTE — Progress Notes (Signed)
Speech Language Pathology Daily Session Note  Patient Details  Name: VERLIE HELLENBRAND MRN: [id number] Date of Birth: 21-Jun-1981  Today's Date: 09/25/2019 SLP Individual Time: 0730-0830 SLP Individual Time Calculation (min): 60 min  Short Term Goals: Week 3: SLP Short Term Goal 1 (Week 3): STG=LTG due to remaining length of stay  Skilled Therapeutic Interventions: Pt was seen for skilled ST targeting dysphagia and cognitive goals. SLP facilitated session with skilled observation of pt consuming upgraded regular texture breakfast with thin liquids. Pt with congestion and throat clearing at baseline, which was observed throughout his PO intake, however no immediate overt s/sx aspiration appeared directly linked to intake. Although he verbally recalled recommendations for chin tuck strategy, pt required consistent Mod A verbal and visual cues for  implementation and accuracy of chin tuck during thin consumption, as well as use of slow rate across solids and liquids. SLP used video feedback from pt's MBSS yesterday in order to further educate pt regarding swallow anatomy and aspiration risk without chin tuck. Pt was receptive to education and did demonstrate more consistent implementation of chin tuck after reviewing MBSS and teach-back of chin tuck strategy. In functional conversation targeting short term memory, pt required Min A question cues in order to recall general medication regimen (number and type/function) of current medications. While he could identify "getting out of bed" as 1 physical task he would need assistance with at home, he required multiple choice cues to identify 1 cognitive impairment (memory) and its impact on daily functioning. Education including concrete examples of implications of short term memory deficits discussed with pt. Pt used his schedule to anticipate future therapy appointments with Supervision A verbal/visual cues. Family education scheduled for later this afternoon. Pt  left laying in bed with alarm set and all needs within reach.      Pain Pain Assessment Pain Scale: 0-10 Pain Score: 0-No pain  Therapy/Group: Individual Therapy  Arbutus Leas 09/25/2019, 10:56 AM

## 2019-09-25 NOTE — Progress Notes (Signed)
Occupational Therapy Session Note  Patient Details  Name: Arthur [name] MRN: [id number] Date of Birth: [birth year]  Today's Date: 09/25/2019 OT Individual Time: 7544-9201 OT Individual Time Calculation (min): 12 min    Short Term Goals: Week 3:  OT Short Term Goal 1 (Week 3): STGs = LTGs due to remaining LOS  Skilled Therapeutic Interventions/Progress Updates:    Pt seen for family education with mother.  Mother and pt having just completed PT family education sessions, to include functional mobility and bathroom transfers.  Pt's mother reports comfortable with providing physical assistance with mobility and understanding of hand placement to ensure pt and caregiver safety.  Pt's mother asking questions about routine and sequencing during bathing and dressing tasks, therapist answered to pt and mother's satisfaction.  Educated on impulsivity and recommendation for CGA with all mobility and dynamic standing balance for pt safety.  Pt and mother reporting understanding.  Therapy Documentation Precautions:  Precautions Precautions: Fall Precaution Comments: PEG, foley, visual field loss Rt eye Restrictions Weight Bearing Restrictions: No Pain: Pain Assessment Pain Scale: 0-10 Pain Score: 0-No pain   Therapy/Group: Individual Therapy  Simonne Come 09/25/2019, 3:23 PM

## 2019-09-25 NOTE — Progress Notes (Signed)
[facility] PHYSICAL MEDICINE & REHABILITATION PROGRESS NOTE   Subjective/Complaints: Patient seen sitting up in bed this AM, eating breakfast voraciously, working with therapies. He is happy his diet was advanced. Discussed diet with therapies.    ROS: Denies CP, SOB, N/V/D  Objective:   No results found. Recent Labs    09/23/19 1045  WBC 3.4*  HGB 11.3*  HCT 32.9*  PLT 305   Recent Labs    09/23/19 1045  NA 135  K 3.9  CL 104  CO2 22  GLUCOSE 88  BUN 7  CREATININE 0.79  CALCIUM 8.6*    Intake/Output Summary (Last 24 hours) at 09/25/2019 0919 Last data filed at 09/25/2019 0805 Gross per 24 hour  Intake 680 ml  Output 1900 ml  Net -1220 ml     Physical Exam: Vital Signs Blood pressure 90/60, pulse 73, temperature 98.8 F (37.1 C), resp. rate 17, weight 55.8 kg, SpO2 100 %.  Constitutional: No distress . Vital signs reviewed. + Cachectic.  HENT: Normocephalic.  Atraumatic. Eyes: EOMI. No discharge. Cardiovascular: No JVD. Respiratory: Normal effort.  No stridor. GI: Non-distended. + PEG.  Skin: Warm and dry.  Intact. Psych: Normal mood.  Normal behavior. Musc: No edema in extremities.  No tenderness in extremities. Neurological: Alert Dysarthria, improving Motor: Grossly 4-4+/5 throughout, improving  Assessment/Plan: 1. 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  Physiatrist is providing close team supervision and 24 hour management of active medical problems listed below.  Physiatrist and rehab team continue to assess barriers to discharge/monitor patient progress toward functional and medical goals  Care Tool:  Bathing  Bathing activity did not occur: Refused Body parts bathed by patient: Right arm, Left arm, Chest, Abdomen, Front perineal area, Right upper leg, Left upper leg, Face, Buttocks   Body parts bathed by helper: Buttocks,  Right lower leg, Left lower leg     Bathing assist Assist Level: Contact Guard/Touching assist     Upper Body Dressing/Undressing Upper body dressing   What is the patient wearing?: Pull over shirt    Upper body assist Assist Level: Minimal Assistance - Patient > 75%    Lower Body Dressing/Undressing Lower body dressing      What is the patient wearing?: Pants     Lower body assist Assist for lower body dressing: Minimal Assistance - Patient > 75%     Toileting Toileting Toileting Activity did not occur (Clothing management and hygiene only): Refused  Toileting assist Assist for toileting: Moderate Assistance - Patient 50 - 74% Assistive Device Comment: bedside commode   Transfers Chair/bed transfer  Transfers assist     Chair/bed transfer assist level: Contact Guard/Touching assist     Locomotion Ambulation   Ambulation assist      Assist level: Contact Guard/Touching assist Assistive device: Walker-rolling Max distance: 7ft   Walk 10 feet activity   Assist     Assist level: Contact Guard/Touching assist Assistive device: Walker-rolling   Walk 50 feet activity   Assist    Assist level: Contact Guard/Touching assist Assistive device: Walker-rolling    Walk 150 feet activity   Assist Walk 150 feet activity did not occur: Safety/medical concerns  Assist level: (per report)      Walk 10 feet on uneven surface  activity   Assist Walk 10 feet on uneven surfaces activity did not occur: Safety/medical concerns   Assist level: Moderate Assistance - Patient -  50 - 74% Assistive device: Photographer Will patient use wheelchair at discharge?: Yes Type of Wheelchair: Manual    Wheelchair assist level: Supervision/Verbal cueing Max wheelchair distance: 157ft    Wheelchair 50 feet with 2 turns activity    Assist        Assist Level: Supervision/Verbal cueing   Wheelchair 150 feet activity      Assist      Assist Level: Supervision/Verbal cueing   Blood pressure 90/60, pulse 73, temperature 98.8 F (37.1 C), resp. rate 17, weight 55.8 kg, SpO2 100 %.  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  2. Antithrombotics: -DVT/anticoagulation:Pharmaceutical:Lovenox -antiplatelet therapy: N/A 3. Pain Management:Tramadol or Tylenol prn for abdominal pain. 4. Mood:LCSW to follow for evaluation and support -antipsychotic agents: N/A 5. Neuropsych: This patientis not fullycapable of making decisions on hisown behalf. 6. Skin/Wound Care:routine pressure relief measures 7. Fluids/Electrolytes/Nutrition: 8. 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  9. 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 10. Malnutrition/Dysphagia: see above.  PEG placed on 09/02/2019  Unable to tolerate bolus tube feeds, converted back to continuous.  DCed tube feeds  Advanced to regular diet thins with full supervision, double portions 11. Hypomagnesemia: Supplement as needed. added supplement thru peg, increased on 12/18.   Mag 1.7 on 12/25, will order labs for the end of the week  Continue to monitor 12.  Hypotension  Soft, asymptomatic on 12/30  Midodrine increased to 5 TID on 09/11/2019, communicated with pharmacy 13.  Hyponatremia  Sodium 135 on 12/28, plan to order labs for the end of the week  Continue to monitor 14.  Leukopenia-secondary to medications  WBCs 3.4 on 12/28, plan to order labs for the end of the week  Continue to monitor 15.  Acute blood loss  anemia  Hemoglobin 11.3 on 12/28  Continue to monitor 16.  Slow transit constipation  Bowel meds increased on 12/16  Added MiraLAX  Improving 17. Dizziness:   Started meclizine to see if this provides relief during therapy sessions   Improving  LOS: 18 days A FACE TO FACE EVALUATION WAS PERFORMED   Karis Juba 09/25/2019, 9:19 AM

## 2019-09-25 NOTE — Telephone Encounter (Signed)
Thanks for your help.

## 2019-09-25 NOTE — Telephone Encounter (Signed)
Typically the medication will be filled at transitions of care and discharged with the patient. I added him to the [facility] pharmacy system and can apply for Advancing Access if inpatient needs [place] to.

## 2019-09-25 NOTE — Telephone Encounter (Signed)
Ok thanks so much [facility]

## 2019-09-25 NOTE — Progress Notes (Addendum)
Physical Therapy Session Note  Patient Details  Name: Arthur [name] MRN: [id number] Date of Birth: 03/06/1981  Today's Date: 09/25/2019 PT Individual Time: 1135-1210 PT Individual Time Calculation (min): 35 min   Short Term Goals:  Week 3:  PT Short Term Goal 1 (Week 3): STG=LTG due to LOS  Skilled Therapeutic Interventions/Progress Updates:  Pt resting in wc.  He stated that he was tired, and bil knee joint pain 6/10.  He declined meds. Eccymosis noted bil patellae.  Stand pivot transfer w/c> mat with CGA.    Neuromuscular re-education via forced use, demo for sustained stretch bil heel cords and hamstrings, standing with forefeet on blue wedge with bil UE support> 0UE support, x 30 seconds x 2. Pt with slight sway backwards, but not LOB.  Duration limited by knee pain.   Further stetching R/L in sitting, using footstool, x 30 seconds x 1 each.  Gait training iwht RW on level tile x 150'.  As pt fatigued, pt's BOS narrowed, with slight scissoring.  Pt stated that knees hurt at all times.  PT provided disposable hot packs for bil knees.  At end of session, pt seated in w/c with seat pad alarm set and needs at hand.  Pt used urinal independently in sitting.  PT provided hand cleaner.      Therapy Documentation Precautions:  Precautions Precautions: Fall Precaution Comments: PEG, foley, visual field loss Rt eye Restrictions Weight Bearing Restrictions: No      Therapy/Group: Individual Therapy  Mckinnon Glick 09/25/2019, 12:18 PM

## 2019-09-25 NOTE — Progress Notes (Signed)
Physical Therapy Session Note  Patient Details  Name: Arthur [name] MRN: [id number] Date of Birth: October 18, 1980  Today's Date: 09/25/2019 PT Individual Time: 1400-1430 PT Individual Time Calculation (min): 30 min   Short Term Goals: Week 2:  PT Short Term Goal 1 (Week 2): Pt will ambulate 100 ft with LRAD and min assist PT Short Term Goal 1 - Progress (Week 2): Met PT Short Term Goal 2 (Week 2): Pt will ascend/descend 8 steps with rails and min assist PT Short Term Goal 2 - Progress (Week 2): Met PT Short Term Goal 3 (Week 2): Pt will perform car transfer with LRAD CGA PT Short Term Goal 3 - Progress (Week 2): Progressing toward goal Week 3:  PT Short Term Goal 1 (Week 3): STG=LTG due to LOS  Skilled Therapeutic Interventions/Progress Updates:   Received pt supine in bed, pt agreeable to therapy, and reported pain in bilateral knees. Pt's mom states nursing just applied topical cream to knees prior to start of PT session. Pt's mom present for family education. Session focused on functional mobility/transfers, ambulation, stair navigation, simulated car transfer, and improved endurance with activity. Pt ambulated 27f to bathroom with RW CGA/min A due to RW getting stuck over threshold when entering bathroom. Pt performed toilet transfer CGA. Pt's mom educated on hand positioning and safety when ambulating with pt. Pt navigated 4 steps with 2 rails CGA with therapist and 4 steps with 2 rails with mother CGA ascending and descending with a step to pattern. Pt ambulated 540fwith RW CGA with mother to car and performed car transfer CGA. Pt required verbal cues for technique to turn and sit instead of side stepping into car. Pt and mother verbalized and demonstrated confidence with tasks. Pt's mother educated on pt's tendency to lose balance especially when turning. Concluded session with pt sitting in WC, needs within reach, chair pad alarm on and SLP present for session.   Therapy  Documentation Precautions:  Precautions Precautions: Fall Precaution Comments: PEG, foley, visual field loss Rt eye Restrictions Weight Bearing Restrictions: No  Therapy/Group: Individual Therapy AnAlfonse AlpersT, DPT   09/25/2019, 7:57 AM

## 2019-09-25 NOTE — Progress Notes (Signed)
Social Work Patient ID: Arthur [name], male   DOB: 08/01/1981, 38 y.o.   MRN: [id number] Met with pt and Mom while here for education. It is going well and pt will have 24 hr care at home. Aware of team conference progress and discharge 1/1.

## 2019-09-25 NOTE — Patient Care Conference (Signed)
Inpatient RehabilitationTeam Conference and Plan of Care Update Date: 09/25/2019   Time: 11:40 AM    Patient Name: Arthur [name]      Medical Record Number: [id number]  Date of Birth: 1980/11/02 Sex: Male         Room/Bed: 7Q46N/6E95M-84 Payor Info: Payor: /    Admit Date/Time:  09/07/2019  3:20 PM  Primary Diagnosis:  CNS toxoplasmosis [facility])  Patient Active Problem List   Diagnosis Date Noted  . AIDS dementia complex (HCC)   . S/P percutaneous endoscopic gastrostomy (PEG) tube placement (HCC)   . Arterial hypotension   . Slow transit constipation   . Labile blood glucose   . Hypomagnesemia   . Acute blood loss anemia   . Leucopenia   . Labile blood pressure   . HIV disease (HCC)   . Debility 09/07/2019  . Aspiration into airway   . Palliative care by specialist   . DNR (do not resuscitate) discussion   . Weakness generalized   . Dysphagia   . Protein-calorie malnutrition, severe 08/19/2019  . 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 Goals of care, counseling/discussion   . Bacterial meningitis   . Thrush of mouth and esophagus (HCC)   . Palliative care encounter   . HIV (human immunodeficiency virus infection) (HCC) 07/29/2019  . Vision loss 07/29/2019  . Abnormal brain MRI 07/29/2019  . Acquired immunodeficiency syndrome (HCC) 07/28/2019  . Bradycardia 07/28/2019  . Hyponatremia 07/28/2019  . Nausea and vomiting 03/25/2013  . Muscle spasm 03/25/2013  . Drug use 01/11/2012  . 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 COUGH 06/23/2009  . CHEST PAIN, PLEURITIC 06/23/2009  . FUNGAL DERMATITIS 05/28/2008  . GERD 05/28/2008  . DENTAL CARIES 02/13/2008  . NICOTINE ADDICTION 11/14/2007  . BACK STRAIN, LUMBAR 08/22/2007  . 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 WEIGHT LOSS, RECENT 05/01/2007  . ANXIETY DEPRESSION 02/14/2007  . HIV DISEASE 02/13/2007  . CERVICAL LYMPHADENOPATHY, ANTERIOR, RIGHT 02/13/2007    Expected Discharge Date: Expected Discharge Date: 09/27/19  Team Members  Present: Physician leading conference: Dr. Maryla Morrow Social Worker Present: Dossie Der, LCSW Nurse Present: Adora Fridge, RN Case Manager: Roderic Palau, RN  PT Present: Raechel Chute, PT OT Present: Rosalio Loud, OT SLP Present: Suzzette Righter, CF-SLP PPS Coordinator present : Fae Pippin, SLP     Current Status/Progress Goal Weekly Team Focus  Bowel/Bladder   continent of bowel and bladder, still with some episodes of incontinence. LBM 12/29  maintain continence of bowel and bladder. reduce incontinent episodes  assess toileting needs q shift/prn   Swallow/Nutrition/ Hydration   upgraded to Thin via cup (small sips with chin tuck)  Min A  tolerance of regular/thin, carryover of swallow strategies, family education   ADL's   CGA stand pivot transfers, Min A ambulating with RW, Min A bathing and dressing  Supervision seated UB dressing, grooming; CGA dynamic standing balance, LB dressing, transfers  ADL retraining, dynamic standing balance, pt education, d/c planning   Mobility   bed mobility supervision, stand<>pivot CGA, gait 75ft with RW min/mod A, WC mobility 134ft with supervision  CGA  functional mobility/transfers, ambulation, NMR, LE strength, improved endurance   Communication             Safety/Cognition/ Behavioral Observations            Pain   no complaints of pain  remain free of pain  assess pain q shift/prn   Skin   healed stage II pressure injure  prevent further breakdown and  remain free of infection  assess skin q shift/prn      *See Care Plan and progress notes for long and short-term goals.     Barriers to Discharge  Current Status/Progress Possible Resolutions Date Resolved   Nursing                  PT                    OT                  SLP                SW                Discharge Planning/Teaching Needs:  Mom here today to go through education in preparation of DC 1/1. Pt has made good progress and will have 24 hr care at home       Team Discussion: Dizzy over weekend, monitoring labs and BP.  RN PEG tube, reg diet/thins, cont B/B, stage 2 on bottom healing, fall yesterday reported, pain in knee started Monday.  OT CGA transfers, amb short distances, knee pain, CGA LB D, downgrade to CGA goals, fam ed today.  PT S bed, transfers CGA, 8' CGA/min A, scissors feet, goals CGA.  SLP reg/thins upgrade, needs cues for chin tuck, goals cognition min A, working on [employer].   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 Weekly Focus/Goal: Improve mobility, endurance, electrolytes, swallowing,  Barriers to Discharge: Medical stability;Nutrition means;Medication compliance;Wound care   Possible Resolutions to Barriers: Therapies, follow labs, continue to advance diet as tolerated, follow up wtih ID regarding discharge meds   Continued Need for Acute Rehabilitation Level of Care: The patient requires daily medical management by a physician with specialized training in physical medicine and rehabilitation for the following reasons: Direction of a multidisciplinary physical rehabilitation program to maximize functional independence : Yes Medical management of patient stability for increased activity during participation in an intensive rehabilitation regime.: Yes Analysis of laboratory values and/or radiology reports with any subsequent need for medication adjustment and/or medical intervention. : Yes   I attest that I was present, lead the team conference, and concur with the assessment and plan of the team.   Retta Diones 09/25/2019, 9:37 PM  Team conference was held via web/ teleconference due to [place] - 19

## 2019-09-25 NOTE — Plan of Care (Signed)
  Problem: Consults Goal: RH GENERAL PATIENT EDUCATION Description: See Patient Education module for education specifics. Outcome: Progressing   Problem: RH BOWEL ELIMINATION Goal: RH STG MANAGE BOWEL WITH ASSISTANCE Description: STG Manage Bowel with min Assistance. Outcome: Progressing Flowsheets (Taken 09/25/2019 1742) STG: Pt will manage bowels with assistance: 3-Moderate assistance   Problem: RH BLADDER ELIMINATION Goal: RH STG MANAGE BLADDER WITH ASSISTANCE Description: STG Manage Bladder With min Assistance Outcome: Progressing Flowsheets (Taken 09/25/2019 1742) STG: Pt will manage bladder with assistance: 3-Moderate assistance   Problem: RH SKIN INTEGRITY Goal: RH STG SKIN FREE OF INFECTION/BREAKDOWN Description: No new skin breakdown Outcome: Progressing Goal: RH STG MAINTAIN SKIN INTEGRITY WITH ASSISTANCE Description: STG Maintain Skin Integrity With min Assistance. Outcome: Progressing Flowsheets (Taken 09/25/2019 1742) STG: Maintain skin integrity with assistance: 6-Modified independent Goal: RH STG ABLE TO PERFORM INCISION/WOUND CARE W/ASSISTANCE Description: STG Able To Perform Incision/Wound Care With min Assistance. Outcome: Progressing Flowsheets (Taken 09/25/2019 1742) STG: Pt will be able to perform incision/wound care with assistance: 3-Moderate assistance   Problem: RH SAFETY Goal: RH STG ADHERE TO SAFETY PRECAUTIONS W/ASSISTANCE/DEVICE Description: STG Adhere to Safety Precautions With min Assistance/Device. Outcome: Progressing Flowsheets (Taken 09/25/2019 1742) STG:Pt will adhere to safety precautions with assistance/device: 4-Minimal assistance   Problem: RH KNOWLEDGE DEFICIT GENERAL Goal: RH STG INCREASE KNOWLEDGE OF SELF CARE AFTER HOSPITALIZATION Outcome: Progressing

## 2019-09-25 NOTE — Progress Notes (Addendum)
Pharmacy note - transition of care  Pharmacy was asked by Dr. Tommy Medal to assist with clarifying discharge medications.  HAART - will go home with Tivicay and Descovy - one tablet daily of each.  Pharmacy is applying for medication assistance for this and will send RX to [facility] pharmacy  Toxoplasmosis - He will need to continue his Pyrimethamine-leucovorin 50/25 daily and sulfadiazine 1000mg  BID (decrease in dose).  This is a chronic suppressive dose (may be able to stop if CD4 count is >200 for 6 months).  Will send RX of sulfadiazine to [facility].  Inpatient pharmacy will supply him with the remainder of Pyrimethamine/leucovorin - about 19 capsules.  PCP prophylaxis - He will continue on Bactrim liquid 72mL daily 3 times per week.  Will send RX to [facility].  Other meds per rehab team.  Heide Guile, PharmD, BCPS-AQ ID Clinical Pharmacist Pager [phone]   Addendum:  We discovered that Jamarr has UMAP through [facility] which will cover his HIV medications.  Preferred pharmacy is [facility] so will send Rx there instead.  Heide Guile, PharmD, BCPS-AQ ID Clinical Pharmacist Pager [phone]

## 2019-09-25 NOTE — Progress Notes (Signed)
Social Work Discharge Note   The overall goal for the admission was met for:   Discharge location: Yes-HOME WITH SISTER AND MOM TO ASSIST-24 HR CARE  Length of Stay: Yes-20 DAYS  Discharge activity level: Yes-SUPERVISION-CGA LEVEL  Home/community participation: Yes  Services provided included: MD, RD, PT, OT, SLP, RN, CM, TR, Pharmacy, Neuropsych and SW  Financial Services: Other: PENDING MEDICAID  Follow-up services arranged: Home Health: [facility]-PT,OT,SP,RN, DME: [facility]-WHEELCHAIR, ROLLING WALKER & 3 IN1 and Patient/Family has no preference for HH/DME agencies TUB [facility] ALSO  Comments (or additional information):MOM WAS HERE FOR EDUCATION AND WILL SHOW HIS SISTER THE CARE INVOLVED. BOTH FEEL HE IS READY FOR DC. MATCH GIVEN TO HIM FOR PRESCRIPTION ASSISTANCE. [facility] APPT 1/7 @ 9:50 AM  Patient/Family verbalized understanding of follow-up arrangements: Yes  Individual responsible for coordination of the follow-up plan: [name]-MOM  Confirmed correct DME delivered: ,  G 09/25/2019    ,  G 

## 2019-09-25 NOTE — Progress Notes (Signed)
Occupational Therapy Session Note  Patient Details  Name: DAVONTAE PRUSINSKI MRN: [id number] Date of Birth: 11-Jun-1981  Today's Date: 09/25/2019 OT Individual Time: 2956-2130 OT Individual Time Calculation (min): 56 min    Short Term Goals: Week 3:  OT Short Term Goal 1 (Week 3): STGs = LTGs due to remaining LOS  Skilled Therapeutic Interventions/Progress Updates:    Treatment session with focus on functional mobility, transfers, and ADL retraining.  Pt received upright in bed with RN administering morning meds.  Pt reporting urgent need to have BM.  Completed stand pivot transfer bed > BSC with CGA.  Pt able to doff pants prior to toileting with CGA for standing balance.  Pt requested therapist assist with hygiene to ensure thoroughness.  Stand pivot transfer BSC > w/c with CGA.  Engaged in bathing and dressing at sit > stand level at sink with CGA for standing balance.  Pt continues to demonstrate occasional posterior lean with dynamic standing balance, but overall much improved.  Ambulated 25'x4 with RW with CGA for endurance. Pt demonstrates increased scissoring as he turns, requiring cues to attend to feet during turns and cues for safety with RW as he tends to push it away as he approaches destination.  Discussed use of BSC at home for urgent toilet needs, otherwise ambulating to bathroom with RW.  Pt in agreement.  Therapy Documentation Precautions:  Precautions Precautions: Fall Precaution Comments: PEG, foley, visual field loss Rt eye Restrictions Weight Bearing Restrictions: No Pain: Pain Assessment Pain Scale: 0-10 Pain Score: 0-No pain   Therapy/Group: Individual Therapy  Simonne Come 09/25/2019, 12:55 PM

## 2019-09-26 ENCOUNTER — Telehealth: Payer: Self-pay | Admitting: Physical Medicine and Rehabilitation

## 2019-09-26 ENCOUNTER — Inpatient Hospital Stay (HOSPITAL_COMMUNITY): Payer: Self-pay | Admitting: Speech Pathology

## 2019-09-26 ENCOUNTER — Inpatient Hospital Stay (HOSPITAL_COMMUNITY): Payer: Medicaid Other

## 2019-09-26 ENCOUNTER — Inpatient Hospital Stay (HOSPITAL_COMMUNITY): Payer: Self-pay

## 2019-09-26 ENCOUNTER — Inpatient Hospital Stay (HOSPITAL_COMMUNITY): Payer: Self-pay | Admitting: Occupational Therapy

## 2019-09-26 DIAGNOSIS — B20 Human immunodeficiency virus [HIV] disease: Secondary | ICD-10-CM

## 2019-09-26 LAB — BASIC METABOLIC PANEL
Anion gap: 8 (ref 5–15)
BUN: 6 mg/dL (ref 6–20)
CO2: 25 mmol/L (ref 22–32)
Calcium: 8.7 mg/dL — ABNORMAL LOW (ref 8.9–10.3)
Chloride: 103 mmol/L (ref 98–111)
Creatinine, Ser: 0.78 mg/dL (ref 0.61–1.24)
GFR calc Af Amer: >60 mL/min (ref >60)
GFR calc non Af Amer: >60 mL/min (ref >60)
Glucose, Bld: 60 mg/dL — ABNORMAL LOW (ref 70–99)
Potassium: 3.8 mmol/L (ref 3.5–5.1)
Sodium: 136 mmol/L (ref 135–145)

## 2019-09-26 LAB — CBC
HCT: 32.1 % — ABNORMAL LOW (ref 39.0–52.0)
Hemoglobin: 10.7 g/dL — ABNORMAL LOW (ref 13.0–17.0)
MCH: 32.3 pg (ref 26.0–34.0)
MCHC: 33.3 g/dL (ref 30.0–36.0)
MCV: 97 fL (ref 80.0–100.0)
Platelets: 350 10*3/uL (ref 150–400)
RBC: 3.31 MIL/uL — ABNORMAL LOW (ref 4.22–5.81)
RDW: 17.4 % — ABNORMAL HIGH (ref 11.5–15.5)
WBC: 4 10*3/uL (ref 4.0–10.5)
nRBC: 0 % (ref 0.0–0.2)

## 2019-09-26 LAB — MAGNESIUM: Magnesium: 1.7 mg/dL (ref 1.7–2.4)

## 2019-09-26 IMAGING — CT CT ABDOMEN W/O CM
2 of 4 series · 16 of 46 positions shown, 18 images · non-contrast
Comparison: [DATE]

CLINICAL DATA: Abdominal pain in area of percutaneous gastrostomy.
HIV.

EXAM:
CT ABDOMEN WITHOUT CONTRAST
TECHNIQUE: Multidetector CT imaging of the abdomen was performed following the
standard protocol without IV contrast.

[Series 3: a/p w/o 5mm · axial · non-contrast · 0.72mm/px · z∈[+1110,+1360]mm · 13 of 56 slices shown, 15 images]
[im 3/56  soft-tissue]
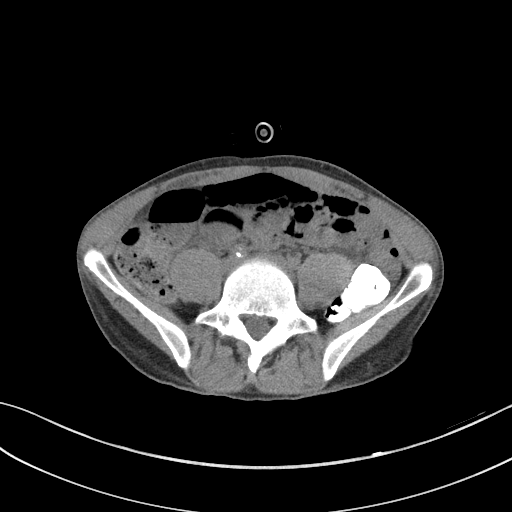
[im 3/56  bone]
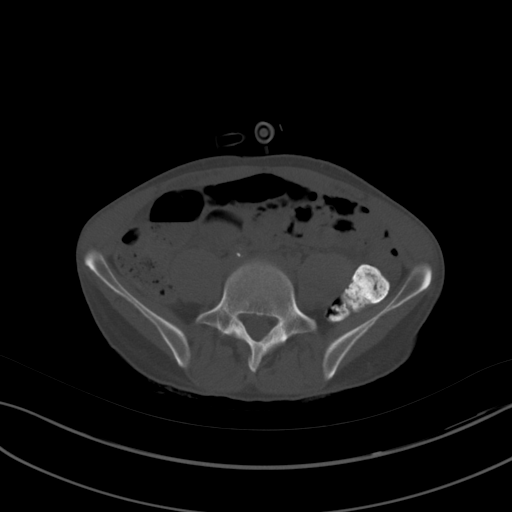
[im 8/56  soft-tissue]
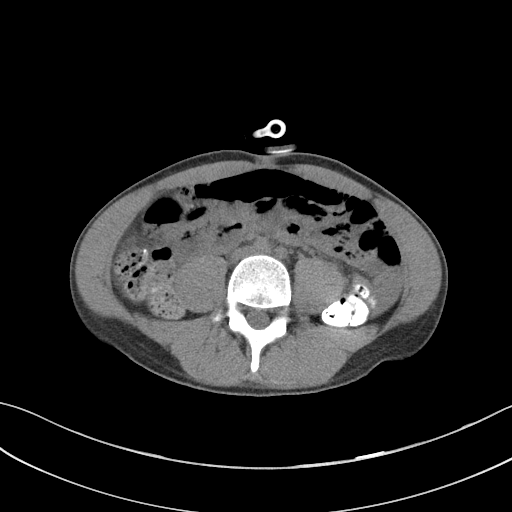
[im 12/56  soft-tissue]
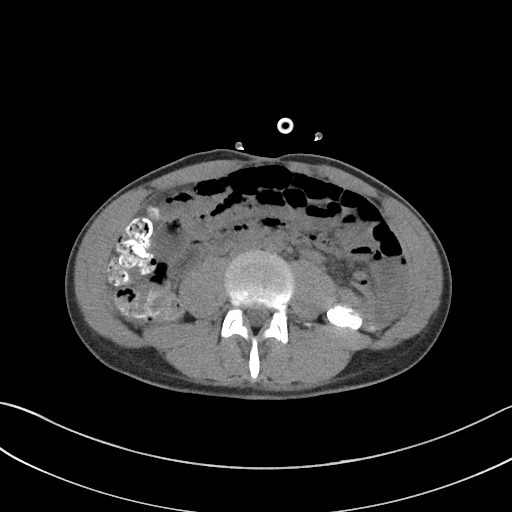
[im 15/56  soft-tissue]
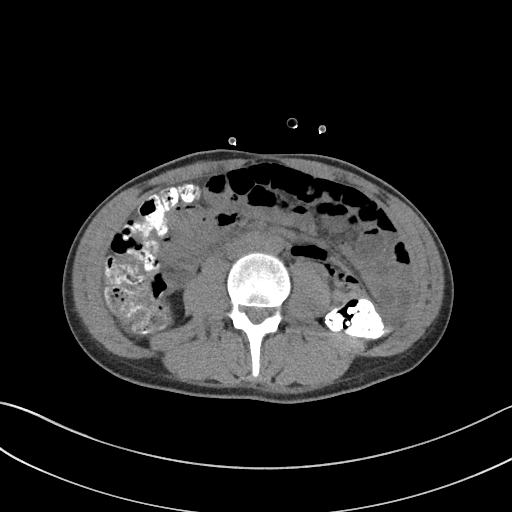
[im 20/56  soft-tissue]
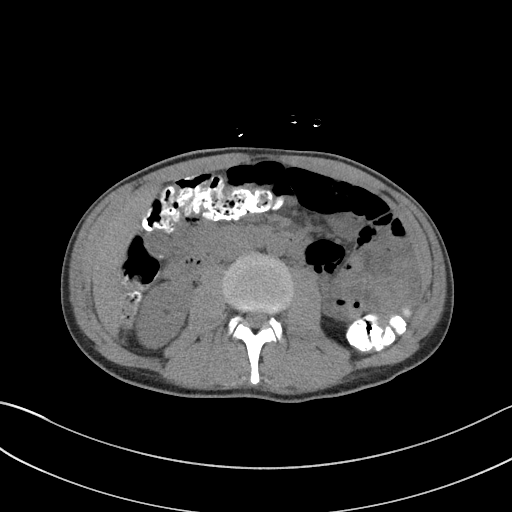
[im 24/56  soft-tissue]
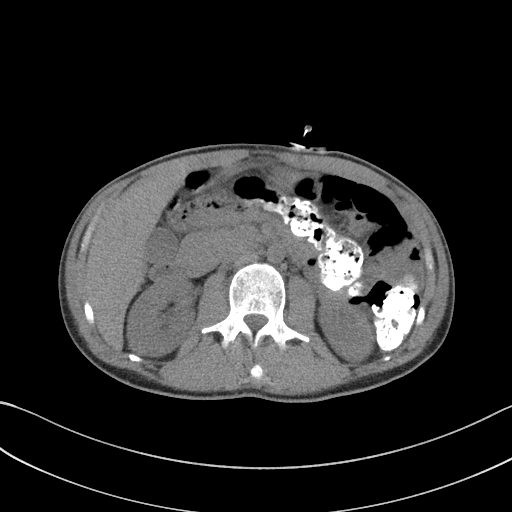
[im 29/56  soft-tissue]
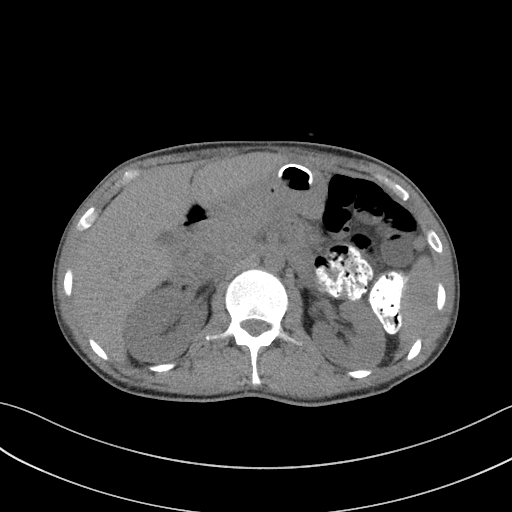
[im 32/56  soft-tissue]
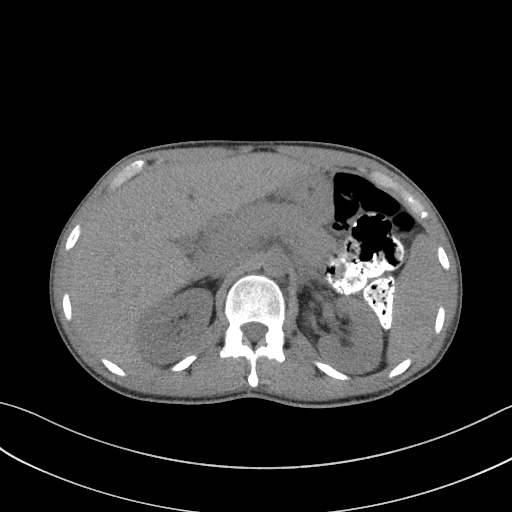
[im 36/56  soft-tissue]
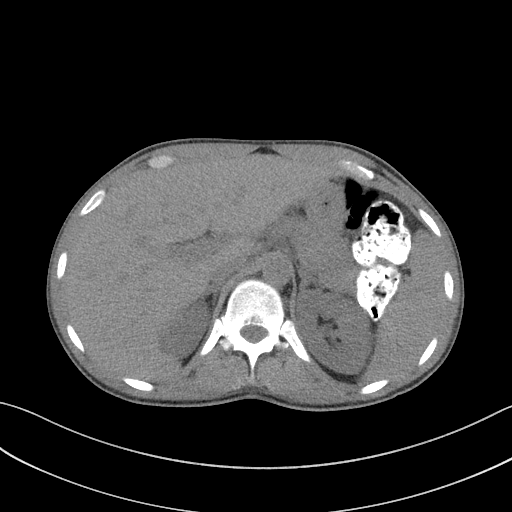
[im 36/56  bone]
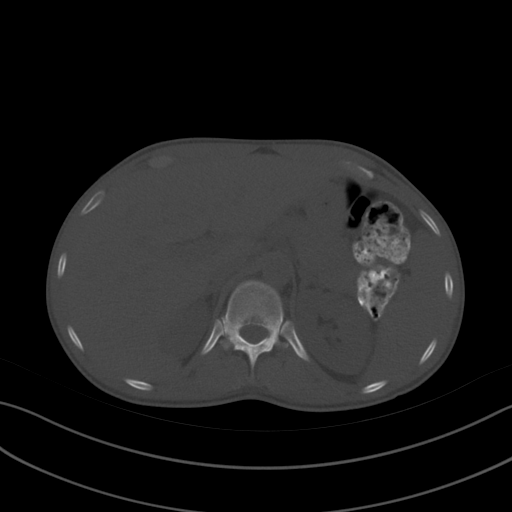
[im 41/56  soft-tissue]
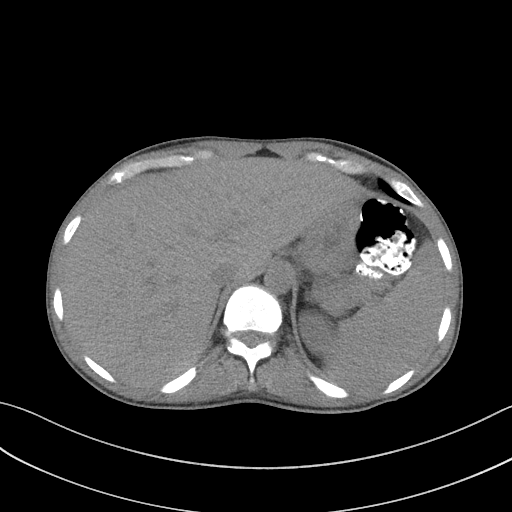
[im 44/56  soft-tissue]
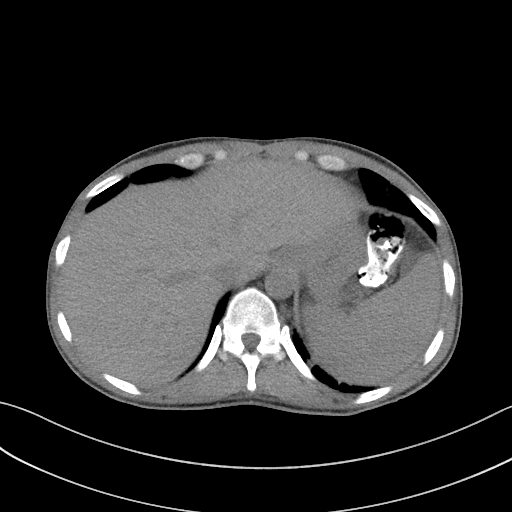
[im 48/56  soft-tissue]
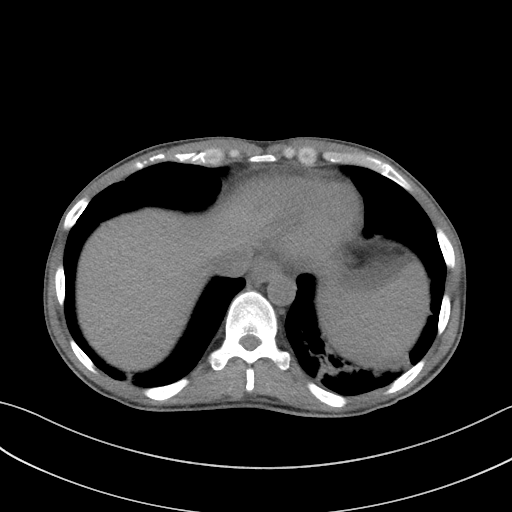
[im 53/56  soft-tissue]
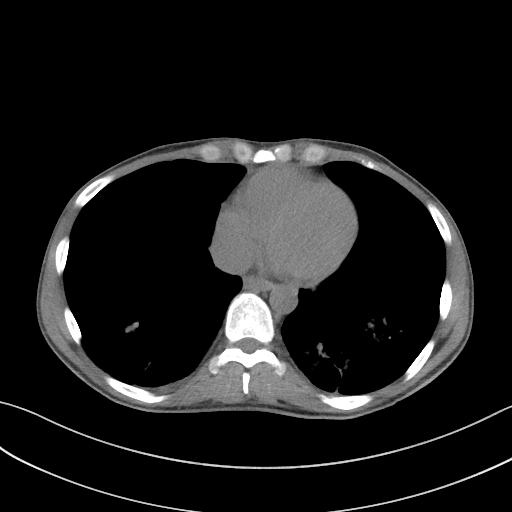

[Series 6: a/p w/o cor · coronal · non-contrast · 0.58mm/px · 3 of 122 slices shown]
[im 41/122  soft-tissue]
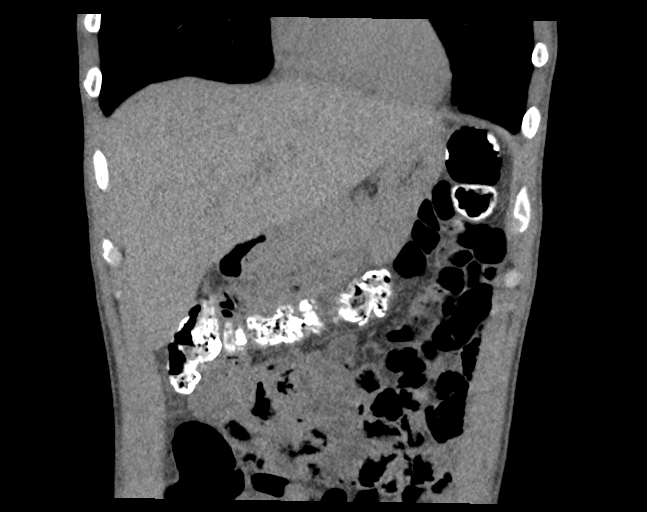
[im 54/122  soft-tissue]
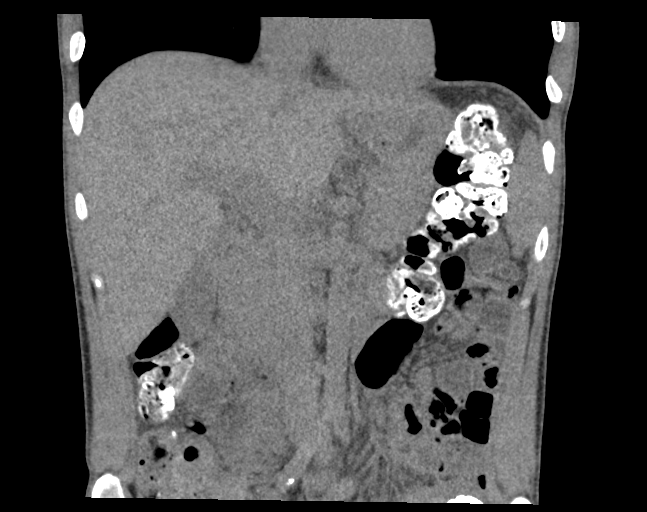
[im 68/122  soft-tissue]
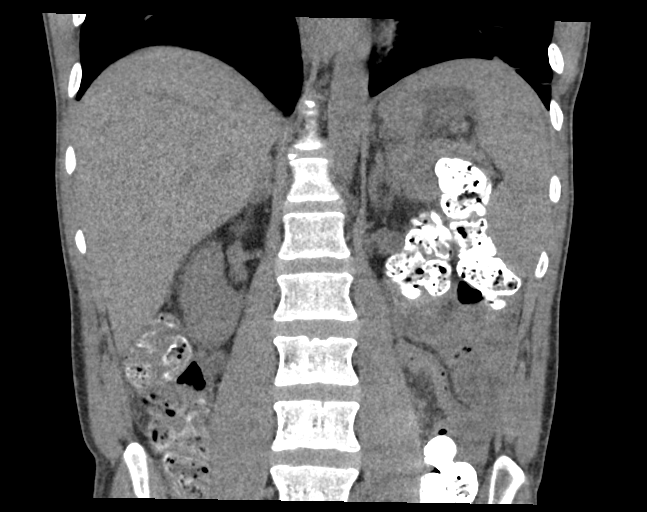

[16 of 46 positions shown; findings below may reference images not displayed]

FINDINGS: Lower chest: Several new ill-defined nodular opacities are seen
which measure 11 mm in the right lower lobe, 8 mm in the inferior
lingula, and 1.6 cm the left lower lobe. New patchy areas of
airspace disease are seen in the left lower lobe. There is
associated radiodense substance in the left lung base which is also
new, and suspicious for aspirated barium.

Hepatobiliary: No masses visualized on this unenhanced exam. One or
2 tiny calcified gallstones are noted, however there is no evidence
of cholecystitis or biliary ductal dilatation.

Pancreas: No mass or inflammatory process visualized on this
unenhanced exam.

Spleen:  Within normal limits in size.

Adrenals/Urinary tract: Unremarkable. No evidence of nephrolithiasis
or hydronephrosis.

Stomach/Bowel: Percutaneous gastrostomy tube is seen in appropriate
position in the distal stomach. No evidence of intraperitoneal air
or abnormal fluid collections. Visualized abdominal bowel loops are
unremarkable.

Vascular/Lymphatic: No pathologically enlarged lymph nodes
identified. No evidence of abdominal aortic aneurysm.

Other:  None.

Musculoskeletal:  No suspicious bone lesions identified.
IMPRESSION: 1. Percutaneous gastrostomy tube in appropriate position. No acute
findings within the abdomen or pelvis.
2. Cholelithiasis. No radiographic evidence of cholecystitis.
3. New patchy areas of airspace disease and radiodense substance in
left lower lobe, suspicious for aspirated barium/aspiration
pneumonia. Several bibasilar pulmonary nodular opacities are also
new, and likely infectious or inflammatory in etiology. Recommend
continued follow-up by chest CT in 2-3 months.

## 2019-09-26 IMAGING — CT CT CHEST W/ CM
2 of 3 series · 15 of 36 positions shown, 18 images · IV contrast (omnipaque)
Comparison: Abdominal CT [DATE]

CLINICAL DATA: Abnormal lung findings on abdominal CT

EXAM:
CT CHEST WITH CONTRAST
TECHNIQUE: Multidetector CT imaging of the chest was performed during
intravenous contrast administration.
CONTRAST:  75mL OMNIPAQUE IOHEXOL 300 MG/ML  SOLN

[Series 3: thorax 2.0 i31f 2 · axial · 0.81mm/px · z∈[+1217,+1477]mm · 12 of 154 slices shown, 15 images]
[im 12/154  mediastinal]
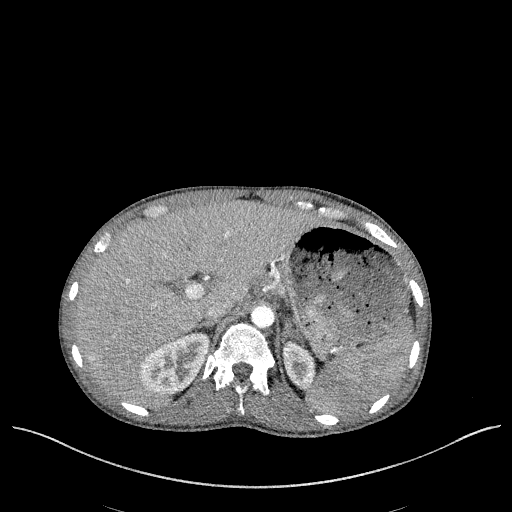
[im 12/154  lung]
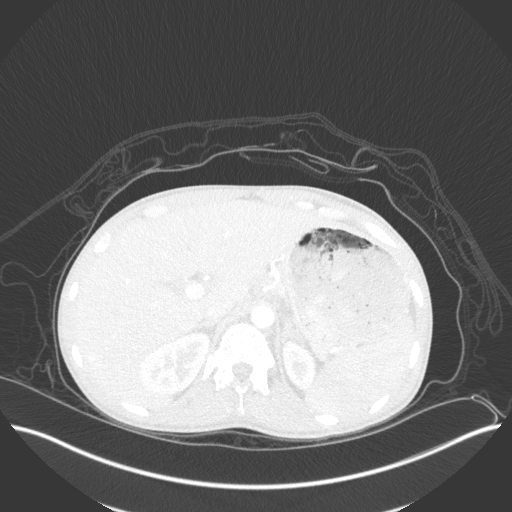
[im 23/154  lung]
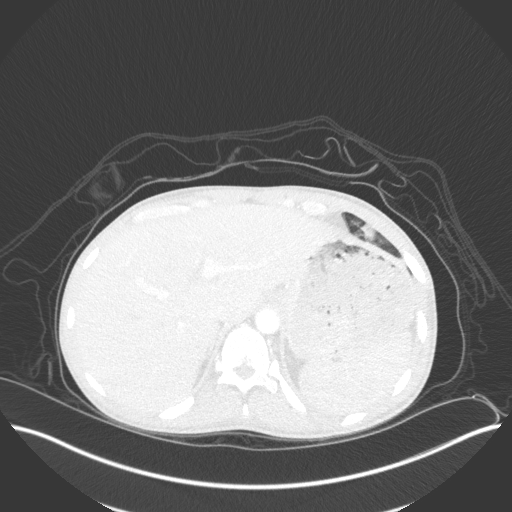
[im 35/154  lung]
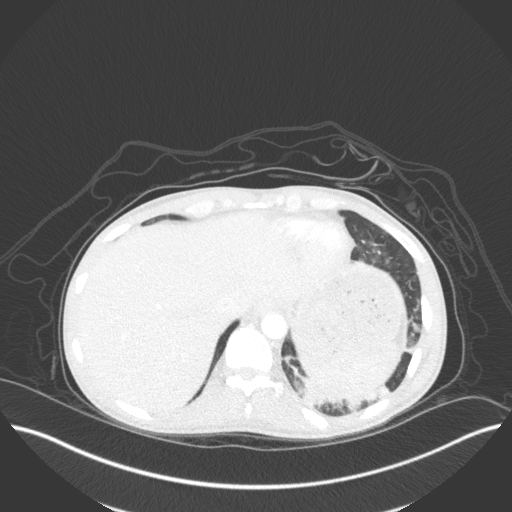
[im 46/154  lung]
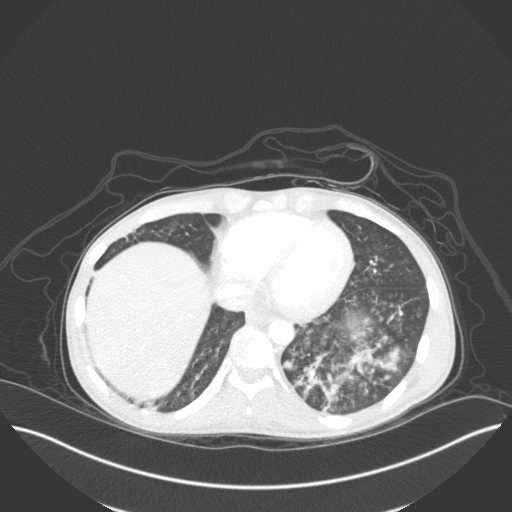
[im 57/154  mediastinal]
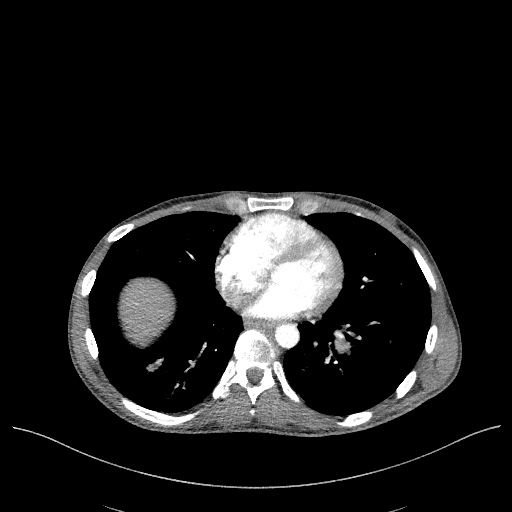
[im 57/154  lung]
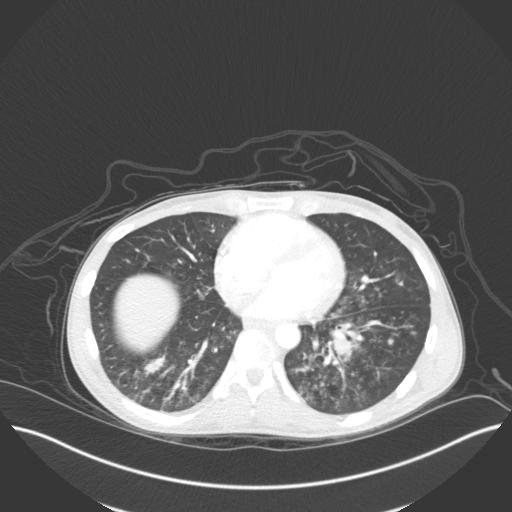
[im 69/154  lung]
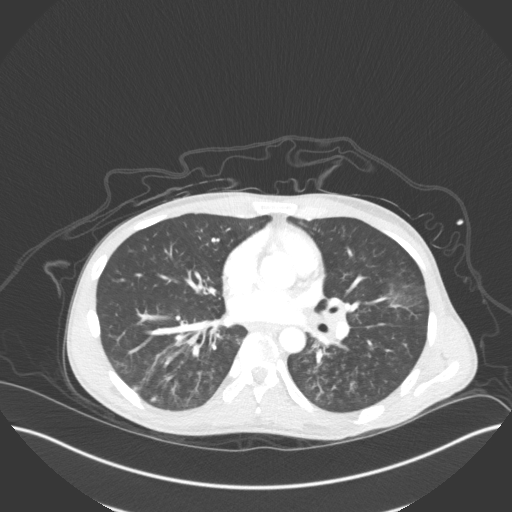
[im 86/154  lung]
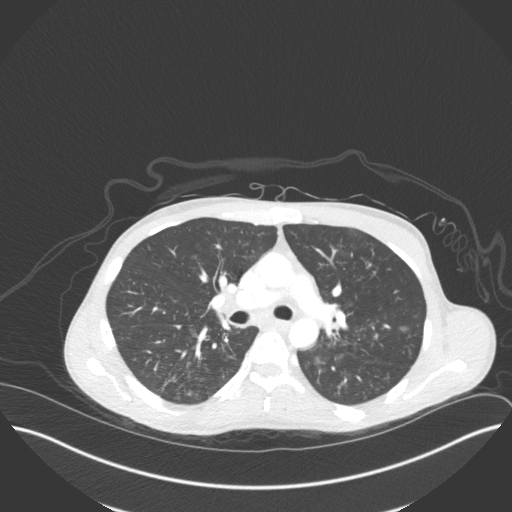
[im 97/154  lung]
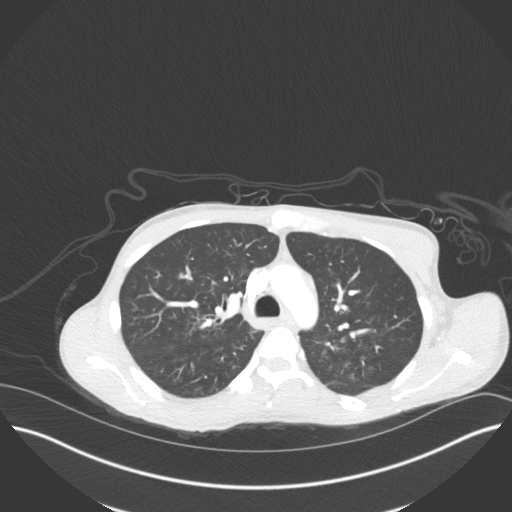
[im 108/154  mediastinal]
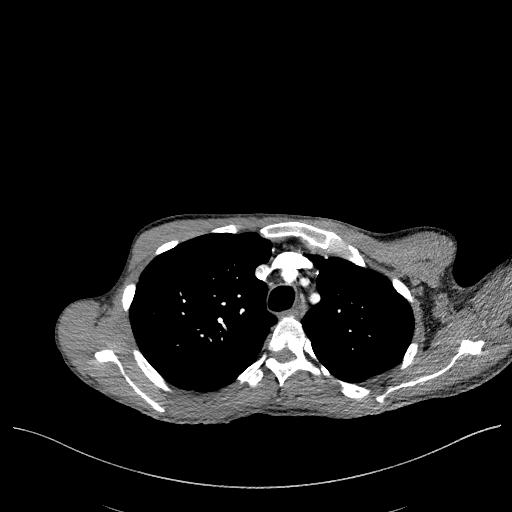
[im 108/154  lung]
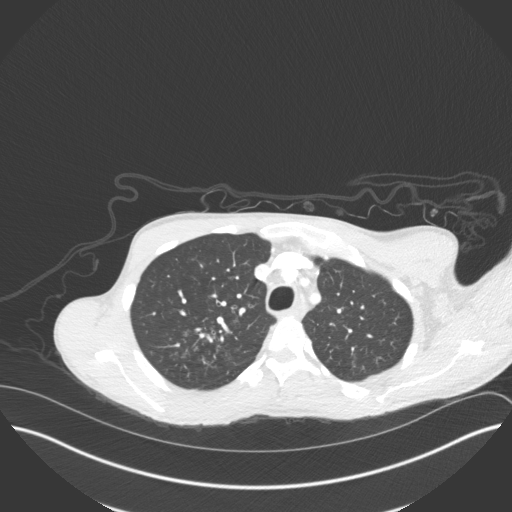
[im 120/154  lung]
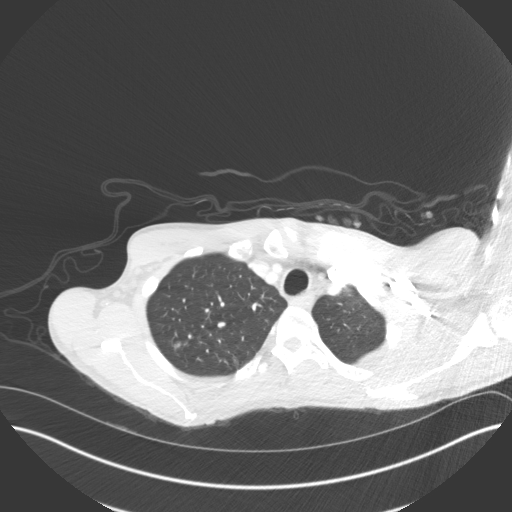
[im 131/154  lung]
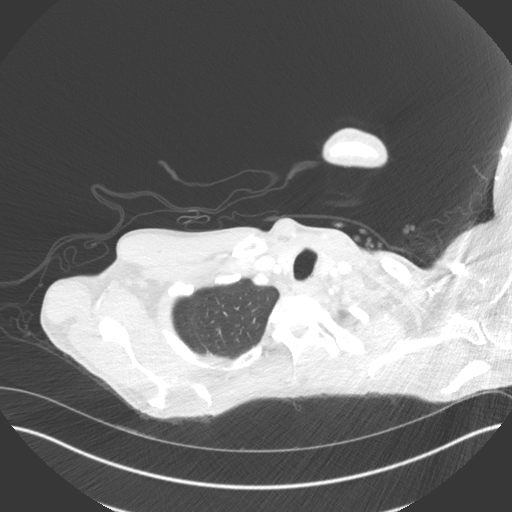
[im 142/154  lung]
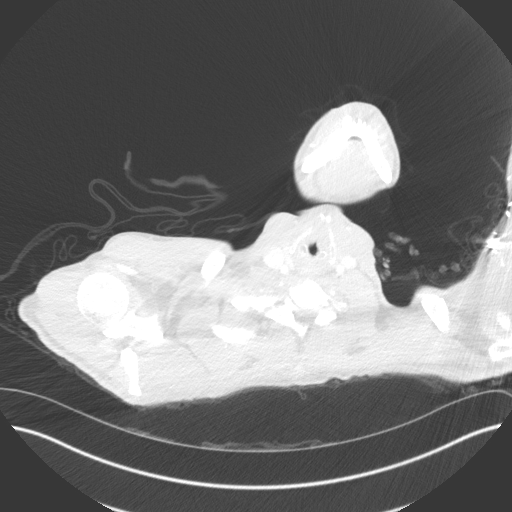

[Series 6: coronal · coronal · 0.60mm/px · 3 of 115 slices shown]
[im 23/115  lung]
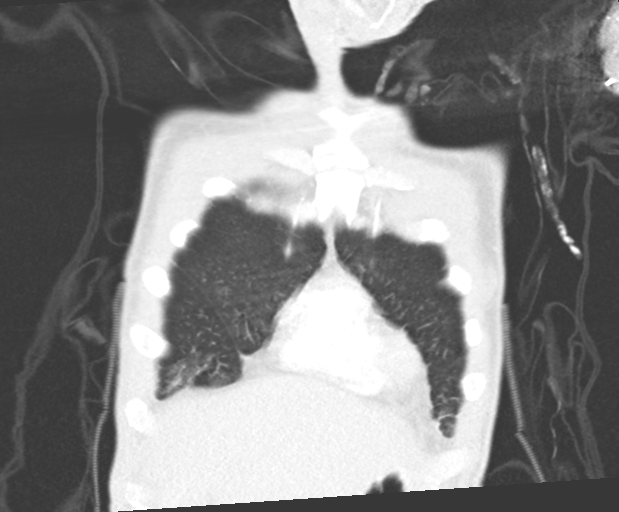
[im 46/115  lung]
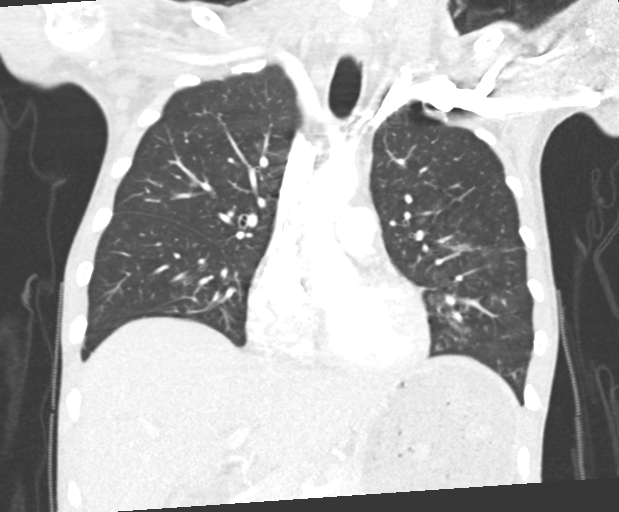
[im 69/115  lung]
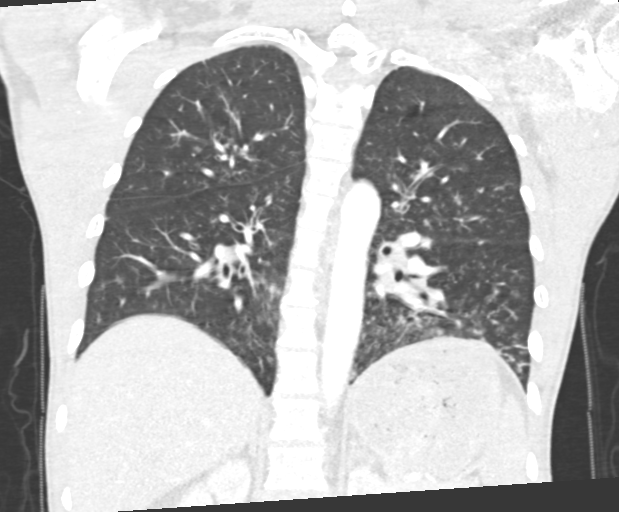

[15 of 36 positions shown; findings below may reference images not displayed]

FINDINGS: Cardiovascular: No significant vascular findings. Normal heart size.
No pericardial effusion.

Mediastinum/Nodes: There are mildly enlarged bilateral axillary
lymph nodes. Mildly enlarged bilateral hilar lymph nodes, left
greater than right. No mediastinal lymphadenopathy. Thyroid gland
unremarkable. Trachea unremarkable. Esophagus unremarkable.

Lungs/Pleura: Redemonstration of ill-defined airspace consolidations
within the bilateral lung bases, left greater than right, some of
which have a more nodular appearance. There are prominent
tree-in-bud opacities within the lower lobes and lingula. Scattered
radiodense material in the inferior left lower lobe likely aspirated
barium from recent swallow study. There are scattered areas of
tree-in-bud and ground-glass opacity within the bilateral upper
lobes. No pleural effusion or pneumothorax.

Upper Abdomen: No acute findings within the visualized upper
abdomen.

Musculoskeletal: Bilateral gynecomastia. No acute osseous findings.
IMPRESSION: 1. Ill-defined airspace consolidations within the bilateral lung
bases, left greater than right, some of which have a more nodular
appearance. There are additional scattered areas of tree-in-bud and
ground-glass opacity throughout both lungs including within the
bilateral upper lobes. Findings are favored to represent an
infectious or inflammatory process, including atypical mycobacterial
infection. Recommend follow-up chest CT in 2-3 months to assess for
resolution.
2. Mildly enlarged bilateral hilar lymph nodes, left greater than
right, likely reactive.
3. Small amount of radiodense material within the dependent portion
of the left lung base, likely aspirated barium.
4. Mildly enlarged bilateral axillary lymph nodes, nonspecific.
5. Bilateral gynecomastia.

## 2019-09-26 MED ORDER — MAGNESIUM OXIDE 400 (241.3 MG) MG PO TABS: 400.0000 mg | ORAL_TABLET | Freq: Two times a day (BID) | ORAL | 0 refills | Status: DC

## 2019-09-26 MED ORDER — PYRIMETHAMINE-LEUCOVORIN 50-25 MG PO CAPS: 1.0000 | ORAL_CAPSULE | Freq: Every day | ORAL | 2 refills | Status: DC

## 2019-09-26 MED ORDER — FAMOTIDINE 40 MG/5ML PO SUSR: 20.0000 mg | Freq: Every day | ORAL | 0 refills | Status: DC

## 2019-09-26 MED ORDER — ADULT MULTIVITAMIN LIQUID CH: 15.0000 mL | Freq: Every day | ORAL | 0 refills | Status: DC

## 2019-09-26 MED ORDER — MECLIZINE HCL 25 MG PO TABS: 25.0000 mg | ORAL_TABLET | Freq: Every day | ORAL | 0 refills | Status: DC

## 2019-09-26 MED ORDER — TRAMADOL HCL 50 MG PO TABS: 50.0000 mg | ORAL_TABLET | Freq: Four times a day (QID) | ORAL | 0 refills | Status: DC | PRN

## 2019-09-26 MED ORDER — MUSCLE RUB 10-15 % EX CREA: 1.0000 "application " | TOPICAL_CREAM | CUTANEOUS | 0 refills | Status: AC | PRN

## 2019-09-26 MED ORDER — FREE WATER: 100.0000 mL | Freq: Two times a day (BID) | 1 refills | Status: DC

## 2019-09-26 MED ORDER — ACETAMINOPHEN 325 MG PO TABS: 325.0000 mg | ORAL_TABLET | ORAL | Status: DC | PRN

## 2019-09-26 MED ORDER — SENNA 8.6 MG PO TABS: 2.0000 | ORAL_TABLET | Freq: Two times a day (BID) | ORAL | 0 refills | Status: DC

## 2019-09-26 MED ORDER — MIDODRINE HCL 5 MG PO TABS: 5.0000 mg | ORAL_TABLET | Freq: Three times a day (TID) | ORAL | 0 refills | Status: DC

## 2019-09-26 MED ORDER — ONDANSETRON HCL 4 MG PO TABS: 4.0000 mg | ORAL_TABLET | Freq: Four times a day (QID) | ORAL | 0 refills | Status: DC | PRN

## 2019-09-26 MED ORDER — POLYETHYLENE GLYCOL 3350 17 G PO PACK: 17.0000 g | PACK | Freq: Every day | ORAL | 0 refills | Status: DC

## 2019-09-26 MED ORDER — TERBINAFINE HCL 1 % EX CREA: TOPICAL_CREAM | Freq: Every day | CUTANEOUS | 0 refills | Status: DC

## 2019-09-26 MED ORDER — SULFAMETHOXAZOLE-TRIMETHOPRIM 200-40 MG/5ML PO SUSP: 20.0000 mL | ORAL | 0 refills | Status: DC

## 2019-09-26 MED ORDER — IOHEXOL 300 MG/ML  SOLN
75.0000 mL | Freq: Once | INTRAMUSCULAR | Status: AC | PRN
  Administered 2019-09-26: 75 mL via INTRAVENOUS

## 2019-09-26 NOTE — Progress Notes (Signed)
Occupational Therapy Session Note  Patient Details  Name: Arthur [name] MRN: [id number] Date of Birth: [birth year]  Today's Date: 09/26/2019 OT Individual Time: 5809-9833 OT Individual Time Calculation (min): 30 min  and Today's Date: 09/26/2019 OT Missed Time: 15 Minutes Missed Time Reason: Pain;Other (comment)(PA providing education)   Short Term Goals: Week 3:  OT Short Term Goal 1 (Week 3): STGs = LTGs due to remaining LOS  Skilled Therapeutic Interventions/Progress Updates:    Upon entering the room, pt supine in bed and begins to cry as he soon as he see's therapist. Pt begins to grimace in pain but is unable to verbalize pain location. He was finally able to point to abdomen at PEG site and explain pain reported at 9/10. OT requesting to to attempt session if he is able and providing education. Pt began urinating on himself in the bed and requires set up A for hygiene. Pt transferred into wheelchair with CGA with RW and OT assisted pt to ADL apartment. Pt ambulating 10' into bathroom with use of RW and CGA and demonstrated transfer into and out of tub shower with TTB. Pt returning back to wheelchair in same manner and assisted back to room. PA present to review some information with pt and he transferred back into bed with CGA. OT placed abdominal binder around PEG site per PA recommendation. Pt missing 15 minutes secondary to continued pain and PA visit. Call bell and all needed items within reach upon exiting the room.   Therapy Documentation Precautions:  Precautions Precautions: Fall Precaution Comments: PEG, R eye visual field loss Restrictions Weight Bearing Restrictions: No General: General OT Amount of Missed Time: 15 Minutes Vital Signs: Therapy Vitals Temp: 99 F (37.2 C) Temp Source: Oral Pulse Rate: 85 Resp: 16 BP: 96/60 Patient Position (if appropriate): Lying Oxygen Therapy SpO2: 97 % O2 Device: Room Air Pain:   ADL: ADL Eating: NPO Grooming:  Moderate assistance Where Assessed-Grooming: Bed level, Edge of bed Upper Body Bathing: Supervision/safety Where Assessed-Upper Body Bathing: Edge of bed Lower Body Bathing: Moderate assistance Where Assessed-Lower Body Bathing: Edge of bed Upper Body Dressing: Minimal assistance Where Assessed-Upper Body Dressing: Edge of bed Lower Body Dressing: Maximal assistance Where Assessed-Lower Body Dressing: Edge of bed Toileting: Not assessed Toilet Transfer: Moderate assistance Toilet Transfer Method: Stand pivot Toilet Transfer Equipment: Engineer, technical sales Transfer: Not assessed   Therapy/Group: Individual Therapy  Gypsy Decant 09/26/2019, 3:55 PM

## 2019-09-26 NOTE — Progress Notes (Signed)
Patient's mother wanted to speak with the [facility] PA . RN paged and talked to [facility]  PA. Relayed message to mother.

## 2019-09-26 NOTE — Discharge Instructions (Signed)
Inpatient Rehab Discharge Instructions  Arthur [name] Discharge date and time:  09/26/18  Activities/Precautions/ Functional Status: Activity: activity as tolerated Diet: Soft foods. Chin tuck with liquids. NO STRAWS. See list of precautions.  Wound Care: Keep area clean and dry. Contact Dr. Earleen Newport if you develop any problems with your incision/wound--redness, swelling, increase in pain, drainage or if you develop fever or chills.    Functional status:  ___ No restrictions     ___ Walk up steps independently __X_ 24/7 supervision/assistance   ___ Walk up steps with assistance ___ Intermittent supervision/assistance  ___ Bathe/dress independently ___ Walk with walker     ___ Bathe/dress with assistance ___ Walk Independently    ___ Shower independently ___ Walk with assistance    ___ Shower with assistance _X__ No alcohol     ___ Return to work/school ________  Special Instructions: 1. Needs supervision with meals. Limit distraction. 2. Wash area around PEG site with soap and water. Pat dry. Use ice pack 2-3 times a day to numb the area.  3. Family needs to administer medications.    COMMUNITY REFERRALS UPON DISCHARGE:    Home Health:   PT, OT, SP,RN  [facility] Phone:[phone] of last service:09/27/2019  Medical Equipment/Items Ordered:WHEELCHAIR, ROLLING WALKER, TUB BENCH, 3 IN 1  Agency/Supplier:[facility]  [phone]  [facility] GIVEN TO PATIENT TO ASSIST WITH MEDICATIONS    My questions have been answered and I understand these instrucdhere to these goals and the provided educational materials after my discharge from the hospital.  Patient/Caregiver Signature _______________________________ Date __________  Clinician Signature _______________________________________ Date __________  Please bring this form and your medication list with you to all your follow-up doctor's appointments.

## 2019-09-26 NOTE — Telephone Encounter (Signed)
Patient still in hospital--chart opened in error

## 2019-09-26 NOTE — Progress Notes (Signed)
Mother educated re: water flushes via peg tube. Able to demonstrate well. All questions answered.

## 2019-09-26 NOTE — Progress Notes (Signed)
Orthopedic Tech Progress Note Patient Details:  DSHAWN MCNAY [birth year] [id number]  Ortho Devices Type of Ortho Device: Abdominal binder Ortho Device/Splint Location: bedside rn to apply Ortho Device/Splint Interventions: Ordered   Post Interventions Instructions Provided: Care of device   Braulio Bosch 09/26/2019, 2:30 PM

## 2019-09-26 NOTE — Progress Notes (Signed)
Speech Language Pathology Discharge Summary  Patient Details  Name: Arthur [name] MRN: [id number] Date of Birth: 12-20-1980  Today's Date: 09/26/2019 SLP Individual Time: 0732-0830 SLP Individual Time Calculation (min): 58 min   Skilled Therapeutic Interventions:  Pt was seen for skilled ST targeting dysphagia and cognitive goals. Provided Min A verbal cues, pt demonstrated accurate and consistent use of chin tuck strategy with thin liquid consumption and no overt s/sx aspiration. Pt also utilized slow rate and small bites/sips with Min A verbal cues throughout intake of regular breakfast tray. Pt is still impulsive with intake, however improved implementation of swallow strategies and safe swallow precautions today in comparison to yesterday. SLP further facilitated session with re-administration of Cognistat evaluation, which pt scored WFL across all subtests with the exceptio of short term memory, visual construction, and calculations subtests, which were indicative of mild impairments, indicative of improved cognitive-linguistic function in these areas in comparison to initial evaluation day during which he scored within the moderate impairment range in those subtests, as well as reasoning which was [facility] today. Pt left laying in bed with NT and RN present to continue full supervision of breakfast. Continue per current plan of care.      Patient has met 6 of 7 long term goals.  Patient to discharge at [facility] level.  Reasons goals not met: cognitive impairment and impulsivity impacting emergent and anticipatory awareness   Clinical Impression/Discharge Summary:   Pt made functional gains and met 6 out of 7 long term goals this admission. Pt currently requires Min assist for cognition and dysphagia, Supervision A for communication. He remained slightly impulsive and will require 24/7 supervision. Pt is consuming regular diet with thin liquids and requires Min A verbal/visual cues to use  swallow strategies. Pt has demonstrated improved oropharyngeal swallow function evidenced by MBSSs throughout his stay. His speech intelligibility has also increased to ~80-85% at the phrase level with his implementation of increased vocal intensity and overarticulation. However, given cognitive impairments impacting problem solving, short term recall, and emergent and anticipatory awareness, as well as dysphagia and dysarthria, recommend pt continue to receive skilled ST services upon discharge. Pt and family education is complete at this time.   Care Partner:  Caregiver Able to Provide Assistance: Yes  Type of Caregiver Assistance: Cognitive;Physical  Recommendation:  Home Health SLP;24 hour supervision/assistance  Rationale for SLP Follow Up: Maximize functional communication;Maximize swallowing safety;Maximize cognitive function and independence;Reduce caregiver burden   Equipment: none   Reasons for discharge: Discharged from hospital   Patient/Family Agrees with Progress Made and Goals Achieved: Yes    Arbutus Leas 09/26/2019, 7:28 AM

## 2019-09-26 NOTE — Progress Notes (Signed)
[place] PHYSICAL MEDICINE & REHABILITATION PROGRESS NOTE   Subjective/Complaints: Patient seen laying in bed this morning.  He states he slept well overnight.  He notes discomfort.  Discussed duration of placement again.  Slightly loosened bumper.  ROS: +PEG discomfort. Denies CP, SOB, N/V/D  Objective:   No results found. Recent Labs    09/23/19 1045  WBC 3.4*  HGB 11.3*  HCT 32.9*  PLT 305   Recent Labs    09/23/19 1045  NA 135  K 3.9  CL 104  CO2 22  GLUCOSE 88  BUN 7  CREATININE 0.79  CALCIUM 8.6*    Intake/Output Summary (Last 24 hours) at 09/26/2019 0848 Last data filed at 09/26/2019 0834 Gross per 24 hour  Intake 403 ml  Output 1950 ml  Net -1547 ml     Physical Exam: Vital Signs Blood pressure 91/60, pulse 67, temperature 98.2 F (36.8 C), temperature source Oral, resp. rate 17, weight 54.9 kg, SpO2 100 %.  Constitutional: No distress . Vital signs reviewed.  Cachectic. HENT: Normocephalic.  Atraumatic. Eyes: EOMI. No discharge. Cardiovascular: No JVD. Respiratory: Normal effort.  No stridor. GI: Non-distended. PEG without erythema or drainage around site. Skin: Warm and dry.  Intact. Psych: Normal mood.  Normal behavior. Musc: No edema in extremities.  No tenderness in extremities. Neurological: Alert Dysarthria, improving Motor: Grossly 4-4+/5 throughout, improving  Assessment/Plan: 1. 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  Physiatrist is providing close team supervision and 24 hour management of active medical problems listed below.  Physiatrist and rehab team continue to assess barriers to discharge/monitor patient progress toward functional and medical goals  Care Tool:  Bathing  Bathing activity did not occur: Refused Body parts bathed by patient: Right arm, Left arm, Chest, Abdomen, Front perineal area, Right upper  leg, Left upper leg, Face, Buttocks   Body parts bathed by helper: Buttocks, Right lower leg, Left lower leg     Bathing assist Assist Level: Contact Guard/Touching assist     Upper Body Dressing/Undressing Upper body dressing   What is the patient wearing?: Pull over shirt    Upper body assist Assist Level: Supervision/Verbal cueing    Lower Body Dressing/Undressing Lower body dressing      What is the patient wearing?: Pants     Lower body assist Assist for lower body dressing: Contact Guard/Touching assist     Toileting Toileting Toileting Activity did not occur (Clothing management and hygiene only): Refused  Toileting assist Assist for toileting: Minimal Assistance - Patient > 75% Assistive Device Comment: bedside commode   Transfers Chair/bed transfer  Transfers assist     Chair/bed transfer assist level: Contact Guard/Touching assist     Locomotion Ambulation   Ambulation assist      Assist level: Contact Guard/Touching assist Assistive device: Walker-rolling Max distance: 51ft   Walk 10 feet activity   Assist     Assist level: Contact Guard/Touching assist Assistive device: Walker-rolling   Walk 50 feet activity   Assist    Assist level: Contact Guard/Touching assist Assistive device: Walker-rolling    Walk 150 feet activity   Assist Walk 150 feet activity did not occur: Safety/medical concerns  Assist level: (per report)      Walk 10 feet on uneven surface  activity   Assist Walk 10 feet on uneven surfaces activity did not occur: Safety/medical concerns   Assist level: Moderate Assistance - Patient -  50 - 74% Assistive device: Photographer Will patient use wheelchair at discharge?: Yes Type of Wheelchair: Manual    Wheelchair assist level: Supervision/Verbal cueing Max wheelchair distance: 15ft    Wheelchair 50 feet with 2 turns activity    Assist        Assist Level:  Supervision/Verbal cueing   Wheelchair 150 feet activity     Assist      Assist Level: Supervision/Verbal cueing   Blood pressure 91/60, pulse 67, temperature 98.2 F (36.8 C), temperature source Oral, resp. rate 17, weight 54.9 kg, SpO2 100 %.  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 Antithrombotics: -DVT/anticoagulation:Pharmaceutical:Lovenox -antiplatelet therapy: N/A 3. Pain Management:Tramadol or Tylenol prn for abdominal pain. 4. Mood:LCSW to follow for evaluation and support -antipsychotic agents: N/A 5. Neuropsych: This patientis not fullycapable of making decisions on hisown behalf. 6. Skin/Wound Care:routine pressure relief measures 7. Fluids/Electrolytes/Nutrition: 8. AIDS/HIV: Continue Tivicay + Descovy, 1 tab daily   Continue Bactrim 20 mL 2 times per week  9. CNS toxoplasmosis: Discussed with pharmacy  Continue Pyrimethamine-Leucovorin 50/25 daily    Sulfadiazine decreased to 1000 mg twice daily 10. Malnutrition/Dysphagia: see above.  PEG placed on 09/02/2019  Unable to tolerate bolus tube feeds, converted back to continuous.  DCed tube feeds  Advanced to regular diet thins with full supervision, double portions, eating well 11. Hypomagnesemia: Supplement as needed. added supplement thru peg, increased on 12/18.   Mag 1.7 on 12/25, will pending  Continue to monitor 12.  Hypotension  Slowly improving on 12/31  Midodrine increased to 5 TID on 09/11/2019, communicated with pharmacy 13.  Hyponatremia  Sodium 135 on 12/28, labs pending  Continue to monitor 14.  Leukopenia-secondary to medications  WBCs 3.4 on 12/28, labs pending  Continue to monitor 15.  Acute blood loss anemia  Hemoglobin 11.3 on 12/28  Continue to monitor 16.  Slow transit constipation  Bowel meds increased on 12/16  Added MiraLAX  Improving 17. Dizziness:   Started  meclizine to see if this provides relief during therapy sessions   Improved  LOS: 19 days A FACE TO FACE EVALUATION WAS PERFORMED  Arthur [name] 09/26/2019, 8:48 AM

## 2019-09-26 NOTE — Progress Notes (Signed)
Physical Therapy Discharge Summary  Patient Details  Name: Arthur [name] MRN: [id number] Date of Birth: 06-26-1981  Today's Date: 09/26/2019 PT Individual Time: 1000-1100 PT Individual Time Calculation (min): 60 min    Patient has met 8 of 8 long term goals due to improved activity tolerance, improved balance, improved postural control, increased strength and improved coordination.  Patient to discharge at a wheelchair level Supervision. Patient's care partner is independent to provide the necessary physical assistance at discharge. Pt's mother attended family education training and practiced ambulation, stair navigation, and transfers (including simulated car transfer) with pt. Pt and mother verbalized and demonstrated confidence with tasks, hand positioning, and body mechanics when guarding pt that are required for discharge home.   All goals met   Recommendation:  Patient will benefit from ongoing skilled PT services in home health setting to continue to advance safe functional mobility, address ongoing impairments in transfers, ambulation, stair navigation, LE strength, balance/coordination/motor planning, endurance, and to minimize fall risk.  Equipment: RW and WC  Reasons for discharge: treatment goals met  Patient/family agrees with progress made and goals achieved: Yes  Today's Interventions: Received pt supine in bed, pt agreeable to therapy, and reported bilateral knee pain is much better today but mentioned discomfort from PEG tube, RN aware. Session focused on functional mobility/transfers, ambulation, stair navigation, LE strength, balance, and improved endurance with activity. Pt performed bed mobility with supervision. Pt donned pants in supine with min A and donned pull over shirt sitting EOB with min A. Pt transferred bed<>WC stand<>pivot without AD CGA. Pt ambulated 161f with RW CGA. Pt navigated 8 steps with 2 rails ascending and descending with a step to pattern CGA.  Pt picked up cup from floor with RW min A. Pt ambulated 171fon uneven surfaces (ramp) with RW heavy CGA. Pt continues to require verbal cues for wider BOS and for RW safety during ambulation. Pt declined practicing a car transfer and verbalized confidence with task (pt performed car transfer x 2 CGA with mother during family education yesterday). Pt performed furniture transfer on couch with RW CGA. Pt reported increased knee pain at end of session, PT provided pt with hot packs for knees and topical muscle rub cream and pt reported significant relief. Pt requested to return to bed despite encouragement to remain in chair; pt agreeable to sit up when mother returns later this afternoon. Pt urinated in urinal while supine in bed independently. Concluded session with pt supine in bed, needs within reach, and bed alarm on.  PT Discharge Precautions/Restrictions Precautions Precautions: Fall Precaution Comments: PEG, R eye visual field loss Restrictions Weight Bearing Restrictions: No Cognition Overall Cognitive Status: Impaired/Different from baseline Arousal/Alertness: Awake/alert Orientation Level: Oriented X4 Attention: Selective Selective Attention: Appears intact Alternating Attention: Appears intact Memory: Impaired Memory Impairment: Decreased recall of new information;Retrieval deficit Awareness: Impaired Awareness Impairment: Emergent impairment Problem Solving: Impaired Problem Solving Impairment: Functional complex;Verbal complex Executive Function: Sequencing Sequencing: Impaired Sequencing Impairment: Functional complex Initiating: Appears intact Safety/Judgment: Impaired Comments: impulsive with mobility Sensation Sensation Light Touch: Appears Intact Proprioception: Impaired by gross assessment Coordination Gross Motor Movements are Fluid and Coordinated: No Fine Motor Movements are Fluid and Coordinated: No Coordination and Movement Description: uncoordinated due to  LE weakness, balance, and mild ataxia Finger Nose Finger Test: Mild dysmetria L>R Heel Shin Test: decreased bilaterally Motor  Motor Motor: Ataxia;Abnormal postural alignment and control Motor - Skilled Clinical Observations: uncoordinated due to LE weakness, balance, and mild ataxia  Mobility Bed Mobility  Bed Mobility: Rolling Right;Rolling Left;Supine to Sit;Sit to Supine Rolling Right: Supervision/verbal cueing Rolling Left: Supervision/Verbal cueing Supine to Sit: Supervision/Verbal cueing Sit to Supine: Supervision/Verbal cueing Transfers Transfers: Stand to Sit;Stand Pivot Transfers;Squat Pivot Transfers;Sit to Stand Sit to Stand: Contact Guard/Touching assist Stand to Sit: Contact Guard/Touching assist Stand Pivot Transfers: Contact Guard/Touching assist Stand Pivot Transfer Details: Verbal cues for technique;Verbal cues for precautions/safety;Verbal cues for sequencing;Verbal cues for safe use of DME/AE Stand Pivot Transfer Details (indicate cue type and reason): verbal cues for hand placement, eccentric control, turning, and RW safety Squat Pivot Transfers: Contact Guard/Touching assist Transfer (Assistive device): Rolling walker Locomotion  Gait Ambulation: Yes Gait Assistance: Contact Guard/Touching assist Gait Distance (Feet): 150 Feet Assistive device: Rolling walker Gait Assistance Details: Verbal cues for safe use of DME/AE;Verbal cues for precautions/safety;Verbal cues for technique;Verbal cues for sequencing Gait Assistance Details: Verbal cues for RW safety, wider BOS, and step sequence Gait Gait: Yes Gait Pattern: Impaired Gait Pattern: Scissoring;Narrow base of support;Ataxic;Decreased trunk rotation Gait velocity: decreased Stairs / Additional Locomotion Stairs: Yes Stairs Assistance: Contact Guard/Touching assist Stair Management Technique: Two rails Number of Stairs: 8 Height of Stairs: 6 Ramp: Diplomatic Services operational officer Mobility: Yes Wheelchair Assistance: Chartered loss adjuster: Both upper extremities;Both lower extermities Wheelchair Parts Management: Needs assistance Distance: 172f  Trunk/Postural Assessment  Cervical Assessment Cervical Assessment: Exceptions to WFL(forward head) Thoracic Assessment Thoracic Assessment: Exceptions to WMckay-Dee Hospital CenterLumbar Assessment Lumbar Assessment: Exceptions to WFL(posterior pelvic tilt) Postural Control Postural Control: Deficits on evaluation  Balance Balance Balance Assessed: Yes Static Sitting Balance Static Sitting - Level of Assistance: 5: Stand by assistance(supervision) Dynamic Sitting Balance Dynamic Sitting - Balance Support: No upper extremity supported Dynamic Sitting - Level of Assistance: 5: Stand by assistance(CGA) Static Standing Balance Static Standing - Level of Assistance: 5: Stand by assistance(CGA) Dynamic Standing Balance Dynamic Standing - Balance Support: Bilateral upper extremity supported Dynamic Standing - Level of Assistance: 5: Stand by assistance(heavy CGA) Extremity Assessment  RLE Assessment RLE Assessment: Exceptions to WEnd[facility]re Of BaltimoreGeneral Strength Comments: grossly generalized to 4/5 LLE Assessment LLE Assessment: Exceptions to WPeak View Behavioral HealthGeneral Strength Comments: grossly generalized to 4/5  AAlfonse AlpersPT, DPT  09/26/2019, 7:52 AM

## 2019-09-26 NOTE — Progress Notes (Signed)
Pam notified of patient's c /o pain re: abdomen peg site No redness noted and flushes good; Secured tube with tape;  had an episode of nausea and vomiting this morning; medicine given; Mother called and expressed concern PA was able to talked to mother

## 2019-09-26 NOTE — Discharge Summary (Addendum)
Physician Discharge Summary  Patient ID: Arthur [name] MRN: [id number] DOB/AGE: February 26, 1981 38 y.o.  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 HIV. EXAM: CT ABDOMEN WITHOUT CONTRAST TECHNIQUE: Multidetector CT imaging of the abdomen was performed following the standard protocol without IV contrast. COMPARISON:  08/31/2019 FINDINGS: Lower chest: Several new ill-defined nodular opacities are seen which measure 11 mm in the right lower lobe, 8 mm in the inferior lingula, and 1.6 cm the left lower lobe. New patchy areas of airspace disease are seen in the left lower lobe. There is associated radiodense substance in the left lung base which is also new, and suspicious for aspirated barium. Hepatobiliary: No masses visualized on this unenhanced exam. One or 2 tiny calcified gallstones are noted, however there is no evidence of cholecystitis or biliary ductal dilatation. Pancreas: No mass or inflammatory process visualized on this unenhanced exam. Spleen:  Within normal limits in size. Adrenals/Urinary tract: Unremarkable. No evidence of nephrolithiasis or hydronephrosis. Stomach/Bowel: Percutaneous gastrostomy tube is seen in appropriate position in the distal stomach. No evidence of intraperitoneal air or abnormal fluid collections. Visualized abdominal bowel loops are unremarkable. Vascular/Lymphatic: No pathologically  enlarged lymph nodes identified. No evidence of abdominal aortic aneurysm. Other:  None. Musculoskeletal:  No suspicious bone lesions identified. IMPRESSION: 1. Percutaneous gastrostomy tube in appropriate position. No acute findings within the abdomen or pelvis. 2. Cholelithiasis. No radiographic evidence of cholecystitis. 3. New patchy areas of airspace disease and radiodense substance in left lower lobe, suspicious for aspirated barium/aspiration pneumonia. Several bibasilar pulmonary nodular opacities are also new, and likely infectious or inflammatory in etiology. Recommend continued follow-up by chest CT in 2-3 months. Electronically Signed   By: Danae Orleans M.D.   On: 09/26/2019 13:15   CT ABDOMEN WO CONTRAST  Result Date: 08/31/2019 CLINICAL DATA:  Evaluate anatomy for potential percutaneous gastrostomy tube placement. EXAM: CT ABDOMEN WITHOUT CONTRAST TECHNIQUE: Multidetector CT imaging of the abdomen was performed following the standard protocol without IV contrast. COMPARISON:  None. FINDINGS: Lack of intravenous contrast limits the ability to evaluate solid abdominal organs. Lower chest: Limited visualization of the lower thorax demonstrates consolidative opacities within the image right lower lobe with associated air bronchograms (image 7, series 4). Ill-defined slight tree-in-bud nodular opacities are seen within the contralateral left lower lobe (image 8, series 4). No pleural effusion. Normal heart size. No pericardial effusion. There is diffuse decreased attenuation of the intra cardiac blood pool as could be seen in the setting of anemia. Hepatobiliary: Normal hepatic contour. The gallbladder is underdistended. No ascites. Pancreas: Suboptimally evaluated on this noncontrast examination. Spleen: Normal noncontrast appearance of the spleen. Adrenals/Urinary Tract: Normal noncontrast appearance of the bilateral kidneys. No renal stones. No urine obstruction. Normal noncontrast appearance the  bilateral adrenal glands. Urinary bladder was not imaged. Stomach/Bowel: The anterior wall of the stomach appears well apposed against the ventral abdominal wall without interposed hepatic parenchyma or transverse colon, likely  suspected improvement in potential percutaneous access window with gastric insufflation. Enteric tube tip terminates within the proximal duodenum. Large colonic stool burden without evidence of enteric obstruction. No pneumoperitoneum, pneumatosis or portal venous gas. Vascular/Lymphatic: Normal caliber of the abdominal aorta. No bulky retroperitoneal or mesenteric adenopathy on this noncontrast examination. Other: Regional soft tissues appear normal. Musculoskeletal: No acute or aggressive osseous abnormalities. IMPRESSION: 1. Gastric anatomy amenable to potential percutaneous gastrostomy tube placement as indicated. 2. Consolidative opacities with associated air bronchograms within the image right lower lobe with scattered ill-defined tree-in-bud opacities within the imaged left, nonspecific though could be seen in the setting infection and/or aspiration. Electronically Signed   By: Sandi Mariscal M.D.   On: 08/31/2019 12:17   CT CHEST W CONTRAST  Result Date: 09/26/2019 CLINICAL DATA:  Abnormal lung findings on abdominal CT EXAM: CT CHEST WITH CONTRAST TECHNIQUE: Multidetector CT imaging of the chest was performed during intravenous contrast administration. CONTRAST:  44mL OMNIPAQUE IOHEXOL 300 MG/ML  SOLN COMPARISON:  Abdominal CT 09/26/2019 FINDINGS: Cardiovascular: No significant vascular findings. Normal heart size. No pericardial effusion. Mediastinum/Nodes: There are mildly enlarged bilateral axillary lymph nodes. Mildly enlarged bilateral hilar lymph nodes, left greater than right. No mediastinal lymphadenopathy. Thyroid gland unremarkable. Trachea unremarkable. Esophagus unremarkable. Lungs/Pleura: Redemonstration of ill-defined airspace consolidations within the bilateral lung  bases, left greater than right, some of which have a more nodular appearance. There are prominent tree-in-bud opacities within the lower lobes and lingula. Scattered radiodense material in the inferior left lower lobe likely aspirated barium from recent swallow study. There are scattered areas of tree-in-bud and ground-glass opacity within the bilateral upper lobes. No pleural effusion or pneumothorax. Upper Abdomen: No acute findings within the visualized upper abdomen. Musculoskeletal: Bilateral gynecomastia. No acute osseous findings. IMPRESSION: 1. Ill-defined airspace consolidations within the bilateral lung bases, left greater than right, some of which have a more nodular appearance. There are additional scattered areas of tree-in-bud and ground-glass opacity throughout both lungs including within the bilateral upper lobes. Findings are favored to represent an infectious or inflammatory process, including atypical mycobacterial infection. Recommend follow-up chest CT in 2-3 months to assess for resolution. 2. Mildly enlarged bilateral hilar lymph nodes, left greater than right, likely reactive. 3. Small amount of radiodense material within the dependent portion of the left lung base, likely aspirated barium. 4. Mildly enlarged bilateral axillary lymph nodes, nonspecific. 5. Bilateral gynecomastia. Electronically Signed   By: Davina Poke D.O.   On: 09/26/2019 19:58   IR GASTROSTOMY TUBE MOD SED  Result Date: 09/02/2019 INDICATION: 38 year old male with dysphagia EXAM: PERC PLACEMENT GASTROSTOMY MEDICATIONS: 2 g Ancef; Antibiotics were administered within 1 hour of the procedure. Scratch ANESTHESIA/SEDATION: Versed 0.5 mg IV; Fentanyl 25 mcg IV Moderate Sedation Time:  10 minutes The patient was continuously monitored during the procedure by the interventional radiology nurse under my direct supervision. CONTRAST:  15mL OMNIPAQUE IOHEXOL 300 MG/ML SOLN - administered into the gastric lumen. FLUOROSCOPY  TIME:  Fluoroscopy Time: 1 minutes 6 seconds (6 mGy). COMPLICATIONS: None PROCEDURE: Informed written consent was obtained from the patient and the patient's family after a thorough discussion of the procedural risks, benefits and alternatives. All questions were addressed. Maximal Sterile Barrier Technique was utilized including caps, mask, sterile gowns, sterile gloves, sterile drape, hand hygiene and skin antiseptic. A timeout was performed prior to the initiation of the procedure. The epigastrium was prepped with Betadine in a sterile fashion, and a sterile drape was applied covering the operative field. A sterile  gown and sterile gloves were used for the procedure. A 5-French orogastric tube is placed under fluoroscopic guidance. Scout imaging of the abdomen confirms barium within the transverse colon. The stomach was distended with gas. Under fluoroscopic guidance, an 18 gauge needle was utilized to puncture the anterior wall of the body of the stomach. An Amplatz wire was advanced through the needle passing a T fastener into the lumen of the stomach. The T fastener was secured for gastropexy. A 9-French sheath was inserted. A snare was advanced through the 9-French sheath. A Teena Dunk was advanced through the orogastric tube. It was snared then pulled out the oral cavity, pulling the snare, as well. The leading edge of the gastrostomy was attached to the snare. It was then pulled down the esophagus and out the percutaneous site. Tube secured in place. Contrast was injected. Patient tolerated the procedure well and remained hemodynamically stable throughout. No complications were encountered and no significant blood loss encountered. IMPRESSION: Status post fluoroscopic placed percutaneous gastrostomy tube, with 20 [place] pull-through. Signed, Yvone Neu. Loreta Ave, DO [facility] Radiology Electronically Signed   By: Gilmer Mor D.O.   On: 09/02/2019 16:56   DG  Swallowing Func-Speech Pathology  Result Date: 09/13/2019 Objective Swallowing Evaluation: Type of Study: MBS-Modified Barium Swallow Study  Patient Details Name: Arthur [name] MRN: [id number] Date of Birth: 06/26/1981 Today's Date: 09/13/2019 Time: SLP Start Time (ACUTE ONLY): 5638 -SLP Stop Time (ACUTE ONLY): 0952 SLP Time Calculation (min) (ACUTE ONLY): 18 min Past Medical History: Past Medical History: Diagnosis Date  Anemia   Anxiety   Depression   GERD (gastroesophageal reflux disease)   HIV infection (HCC)   Substance abuse (HCC)  Past Surgical History: Past Surgical History: Procedure Laterality Date  IR GASTROSTOMY TUBE MOD SED  09/02/2019  TOOTH EXTRACTION   HPI: Pt is a 38 y.o. male admitted with neurosyphillis, CNS toxoplasmosis. MRI 11/1 showed widespread areas concerning for infection, with most dominant abnormality in the L anterior frontal white matter. Hospital course was complicated by hyponatremia and respiratory decline, transferred to ICU on 11/14 but not requiring intubation. Multiple CXRs this admission without acute findings. PMH: substance abuse, HIV, GERD, depression, anxiety, anemia  Subjective: pt upset after swallow study Assessment / Plan / Recommendation CHL IP CLINICAL IMPRESSIONS 09/13/2019 Clinical Impression Pt presents with moderate oropharyngeal dysphagia. Pt's oral phase appears much improved over previous study. During this study he demonstrated improved oral containment of all boluses, active lingual movement and mastication of boluses. This results in improved bolus cohesion and timely posterior propulsion of boluses.  He continues to exhibit base of tongue weakness that results in premature spillage of bolus. Pt's pharyngeal phase is c/b swallow initiation at the vallecula with puree and granola bar. When consuming honey thick liquids, nectar thick liquids and thin liquids, his swallow initiation is delayed to pyriform sinuses. This results in aspiration before and during  the swallow of thin liquids and when consuming larger boluses of nectar thick liquids via cup.  When consuming honey thick liquids and nectar thick liquids via spooon, pt is able to contain the bolus within his pyriform sinuses and protect his airway. Trace reside within laryngeal vestible following aspiration of thin liquids was later silently aspirated with next bolus. Pt intermittently sensed aspiration but intermittent coughing, he was not effective in clearing aspirates.  Recommend dysphagia 2 diet with nectar thick liquids via spoon, oral care before and after PO intake, medicine crushed in puree with full  nursing supervision during consumption. SLP Visit Diagnosis Dysphagia, oropharyngeal phase (R13.12) Attention and concentration deficit following -- Frontal lobe and executive function deficit following -- Impact on safety and function Moderate aspiration risk   CHL IP TREATMENT RECOMMENDATION 09/13/2019 Treatment Recommendations Therapy as outlined in treatment plan below   Prognosis 08/23/2019 Prognosis for Safe Diet Advancement Good Barriers to Reach Goals Severity of deficits Barriers/Prognosis Comment -- CHL IP DIET RECOMMENDATION 09/13/2019 SLP Diet Recommendations Dysphagia 2 (Fine chop) solids;Nectar thick liquid Liquid Administration via Spoon Medication Administration Crushed with puree Compensations Minimize environmental distractions;Slow rate;Small sips/bites Postural Changes Seated upright at 90 degrees   CHL IP OTHER RECOMMENDATIONS 09/13/2019 Recommended Consults -- Oral Care Recommendations Oral care before and after PO Other Recommendations Have oral suction available   CHL IP FOLLOW UP RECOMMENDATIONS 09/13/2019 Follow up Recommendations Inpatient Rehab   CHL IP FREQUENCY AND DURATION 08/23/2019 Speech Therapy Frequency (ACUTE ONLY) min 2x/week Treatment Duration 2 weeks      CHL IP ORAL PHASE 09/13/2019 Oral Phase Impaired Oral - Pudding Teaspoon -- Oral - Pudding Cup -- Oral - Honey  Teaspoon Premature spillage Oral - Honey Cup -- Oral - Nectar Teaspoon Premature spillage Oral - Nectar Cup Premature spillage Oral - Nectar Straw -- Oral - Thin Teaspoon Premature spillage Oral - Thin Cup Premature spillage Oral - Thin Straw NT Oral - Puree WFL Oral - Mech Soft WFL Oral - Regular -- Oral - Multi-Consistency -- Oral - Pill -- Oral Phase - Comment --  CHL IP PHARYNGEAL PHASE 09/13/2019 Pharyngeal Phase Impaired Pharyngeal- Pudding Teaspoon -- Pharyngeal -- Pharyngeal- Pudding Cup -- Pharyngeal -- Pharyngeal- Honey Teaspoon Delayed swallow initiation-pyriform sinuses Pharyngeal -- Pharyngeal- Honey Cup Delayed swallow initiation-pyriform sinuses Pharyngeal -- Pharyngeal- Nectar Teaspoon Delayed swallow initiation-pyriform sinuses Pharyngeal -- Pharyngeal- Nectar Cup Delayed swallow initiation-pyriform sinuses;Penetration/Aspiration during swallow;Penetration/Aspiration before swallow;Moderate aspiration Pharyngeal Material enters airway, passes BELOW cords without attempt by patient to eject out (silent aspiration);Material enters airway, passes BELOW cords and not ejected out despite cough attempt by patient Pharyngeal- Nectar Straw -- Pharyngeal -- Pharyngeal- Thin Teaspoon Delayed swallow initiation-pyriform sinuses;Penetration/Aspiration before swallow;Penetration/Aspiration during swallow;Moderate aspiration Pharyngeal Material enters airway, passes BELOW cords without attempt by patient to eject out (silent aspiration);Material enters airway, passes BELOW cords and not ejected out despite cough attempt by patient Pharyngeal- Thin Cup Penetration/Aspiration during swallow;Penetration/Aspiration before swallow;Moderate aspiration Pharyngeal Material enters airway, passes BELOW cords without attempt by patient to eject out (silent aspiration);Material enters airway, passes BELOW cords and not ejected out despite cough attempt by patient Pharyngeal- Thin Straw -- Pharyngeal -- Pharyngeal- Puree  Delayed swallow initiation-vallecula Pharyngeal -- Pharyngeal- Mechanical Soft Delayed swallow initiation-vallecula Pharyngeal -- Pharyngeal- Regular -- Pharyngeal -- Pharyngeal- Multi-consistency -- Pharyngeal -- Pharyngeal- Pill -- Pharyngeal -- Pharyngeal Comment --  CHL IP CERVICAL ESOPHAGEAL PHASE 09/13/2019 Cervical Esophageal Phase WFL Pudding Teaspoon -- Pudding Cup -- Honey Teaspoon -- Honey Cup -- Nectar Teaspoon -- Nectar Cup -- Nectar Straw -- Thin Teaspoon -- Thin Cup -- Thin Straw -- Puree -- Mechanical Soft -- Regular -- Multi-consistency -- Pill -- Cervical Esophageal Comment -- Happi Overton 09/13/2019, 12:39 PM               Labs:  Basic Metabolic Panel: Recent Labs  Lab 09/23/19 1045 09/26/19 0925  NA 135 136  K 3.9 3.8  CL 104 103  CO2 22 25  GLUCOSE 88 60*  BUN 7 6  CREATININE 0.79 0.78  CALCIUM 8.6* 8.7*  MG  --  1.7    CBC: Recent Labs  Lab 09/23/19 1045 09/26/19 0925  WBC 3.4* 4.0  HGB 11.3* 10.7*  HCT 32.9* 32.1*  MCV 96.5 97.0  PLT 305 350    CBG: No results for input(s): GLUCAP in the last 168 hours.  Brief HPI:   Arthur [name] is a 38 y.o. male with history of HIV, substance abuse, depression, GERD who presented to the ED on 07/28/2019 with dizziness, weakness and intermittent headaches.  Patient was noncompliant with medication as his lab work had improved and he felt that he did not require medications anymore as well as due to cost.  MRI Arthur done revealing widespread areas of diffusion abnormal contrast in both cerebral hemispheres predominantly left anterior frontal matter concerning for opportunistic infection and as well as premature generalized atrophy.  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  ID was consulted for input and patient started on Pyrimethamine -Leucovorin as well as sulfadiazine for 6 weeks. 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   Follow-up MRI Arthur showed slow response to treatment.  Esophageal candidiasis treated with IV fluconazole on ART added on 11/30 without signs of IRIS.  Palliative care consulted to discuss goals of care and mother elected on aggressive treatment.  He was maintained on tube feeds with n.p.o. status due to severe dysphagia with difficulty handling oral secretions.  PEG was recommended for nutritional support and mother was agreeable to have this placed on 12/07 by Dr. Loreta Ave.  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  Midodrine was added for BP support on 12/10.  Encephalopathy with confusion is resolving and he was showing improvement in activity tolerance.  CIR was recommended due to functional deficits.   Hospital Course: Arthur [name] was admitted to rehab 09/07/2019 for inpatient therapies to consist of PT, ST and OT at least three hours five days a week. Past admission physiatrist, therapy team and rehab RN have worked together to provide customized collaborative inpatient rehab. He was maintained on tube feeds dysphagia treatment ongoing.  His swallow function has improved and he has been advanced to dysphagia 3 thin liquids with chin tuck.  Tube feeds were discontinued his p.o. intake has been good and he continued to report poor tolerance of tube feeds with nausea, vomiting and abdominal discomfort. Follow-up check of BMP shows lytes and renal status to be within normal limits.  Mag-Ox was added due to hypomagnesemia and dose increased 12/18 with most recent Mg - 1.7.  CBC shows leukopenia resolving and H&H is stable overall.  Blood pressures are stable on midodrine.  Low-dose meclizine was added in a.m. to help with dizziness in the mornings and to help with activity tolerance.  Bowel program has  been adjusted to help manage slow transit constipation.  He was maintained on Pyrimethamine-Leucovorin through 12/30 and is to continues on sulfasalazine.   His PEG site is clean, dry without signs of infection but he continued to have ongoing abdominal pain, therefore radiology was consulted for input.  CT abdomen was recommended for work-up as physical exam was benign.  This showed PEG tube to be in appropriate position without acute findings in abdomen or pelvis.  It did reveal new patchy areas of airspace disease in radiodense substance in LLL suspicious for aspirated  barium and several bibasilar pulmonary nodular opacities likely infectious or inflammatory in etiology.  ID was consulted for input and recommended checking CT chest for full work-up and to consult pulmonary for input if  nodular densities noted.   CT chest showed ill-defined airspace consolidation within bilateral lung bases some with more nodular appearance is an additional scattered areas of tree-in-bud and groundglass opacity in both lungs including bilateral upper lobes.  Findings are favored to represent infectious or inflammatory process and follow-up CT in 2 to 3 months recommended.  Results of chest CT discussed with ID who felt that patient was being treated with current medication and as he did not have any respiratory issues no change in management at present.  Tramadol as needed in addition to ice packs were recommended for local measures to help manage pain. He has made gains during rehab stay and will continue to receive further follow-up HHPT, HHOT, HHST and HHRN by [facility] after discharge.    Rehab course: During patient's stay in rehab weekly team conferences were held to monitor patient's progress, set goals and discuss barriers to discharge. At admission, patient required mod assist with basic ADL task and with mobility.  He exhibited mild to moderate cognitive deficits with delayed processing and increased  latency.  He also presented with severe dysarthria with significantly weakened vocal intensity and decreased speech intelligibility.  He has had improvement in activity tolerance, balance, postural control as well as ability to compensate for deficits.  He is able to complete ADL tasks with supervision.  He requires contact-guard assist for dynamic standing balance to complete ADL tasks. He is able to perform stand pivot transfers without assistive device and with contact-guard assist.  He is able to ambulate 150 feet and climb 8 stairs with rolling walker and contact-guard assist. He is able to complete ADL tasks with supervision.  Requires contact-guard assist for dynamic standing balance to complete ADL tasks  Family education was completed with mother regarding all aspects of care and safety.     Disposition:  Home  Diet: Dysphagia 3, Liquids with chin tuck.   Special Instructions: 1.  Will need chest CT for follow-up in 2 months.  2. Family to administer medication.  3. Needs supervision at meals--limit distraction.    Allergies as of 09/27/2019   No Known Allergies      Medication List     STOP taking these medications    feeding supplement (OSMOLITE 1.5 CAL) Liqd   feeding supplement (PRO-STAT SUGAR FREE 64) Liqd       TAKE these medications    acetaminophen 325 MG tablet Commonly known as: TYLENOL Take 1-2 tablets (325-650 mg total) by mouth every 4 (four) hours as needed for mild pain. What changed:  how much to take when to take this reasons to take this   Descovy 200-25 MG tablet Generic drug: emtricitabine-tenofovir AF Take 1 tablet by mouth daily.   famotidine 40 MG/5ML suspension Commonly known as: PEPCID Take 2.5 mLs (20 mg total) by mouth daily. What changed: how to take this   free water Soln Place 100 mLs into feeding tube 2 (two) times daily after a meal. What changed: when to take this Notes to patient: To flush and keep tube open. Use filtered or  sterile water.    magnesium oxide 400 (241.3 Mg) MG tablet Commonly known as: MAG-OX Take 1 tablet (400 mg total) by mouth 2 (two) times daily.   meclizine 25 MG tablet  Commonly known as: ANTIVERT Take 1 tablet (25 mg total) by mouth daily at 6 (six) AM. Notes to patient: To prevent dizziness   midodrine 5 MG tablet Commonly known as: PROAMATINE Take 1 tablet (5 mg total) by mouth 3 (three) times daily with meals. What changed:  medication strength how much to take Notes to patient: To prevent blood pressure from dropping.    multivitamin Liqd Take 15 mLs by mouth daily. What changed: how to take this Notes to patient: Can buy over the counter--liquid or gummies   Muscle Rub 10-15 % Crea Apply 1 application topically as needed for muscle pain.   ondansetron 4 MG tablet Commonly known as: ZOFRAN Take 1 tablet (4 mg total) by mouth every 6 (six) hours as needed for nausea.   polyethylene glycol 17 g packet Commonly known as: MIRALAX / GLYCOLAX Take 17 g by mouth daily. Notes to patient: For constipation   Pyrimethamine-Leucovorin 50-25 MG Caps Take 1 capsule by mouth daily. What changed:  how much to take how to take this   senna 8.6 MG Tabs tablet Commonly known as: SENOKOT Take 2 tablets (17.2 mg total) by mouth 2 (two) times daily. Notes to patient: For constipation   sulfaDIAZINE 500 MG tablet Take 2 tablets (1,000 mg total) by mouth 2 (two) times daily. What changed:  how to take this when to take this   sulfamethoxazole-trimethoprim 200-40 MG/5ML suspension Commonly known as: BACTRIM Take 20 mLs by mouth 3 (three) times a week. On Mondays, Wednesdays and Fridays What changed:  how to take this additional instructions   terbinafine 1 % cream Commonly known as: LAMISIL Apply topically daily. Apply to feet daily What changed: additional instructions   Tivicay 50 MG tablet Generic drug: dolutegravir Take 1 tablet (50 mg total) by mouth daily.    traMADol 50 MG tablet--Rx# 28 pills.  Commonly known as: ULTRAM Place 1 tablet (50 mg total) into feeding tube every 6 (six) hours as needed for moderate pain. Notes to patient: For moderate to severe abdominal pain.        Follow-up Information     [name], [name], MD Follow up on 09/30/2019.   Specialty: Infectious Diseases Why: be there at 11:15 am Contact information: [address] [place] [address] [phone]         [facility] Follow up on 10/03/2019.   Specialty: Internal Medicine Why: Appointment @ 9:50 am Contact information: [address] [id number] [place] [address] [place] [address] [phone]        Marcello Fennel, MD Follow up.   Specialty: Physical Medicine and Rehabilitation Why: Office will call you with follow up appointment Contact information: [address] [place] [address] [phone]         Gilmer Mor, DO. Call.   Specialties: Interventional Radiology, Radiology Why: for PEG removal (has to stay in 6-8 weeks--was placed on 09/02/19) Contact information: [address] [place] [address] [phone]         Aura Camps, MD. Call.   Specialty: Ophthalmology Why: as needed for vision problems Contact information: [address] [place] [address] [phone]            Signed: Jacquelynn Cree 09/30/2019, 12:29 AM Patient was seen, face-face, and physical exam performed by me on day of discharge, greater than 30 minutes of total time spent.. Please see progress note from day of  discharge as well.  Maryla MorrowAnkit Orby Tangen, MD, ABPMR

## 2019-09-27 DIAGNOSIS — J181 Lobar pneumonia, unspecified organism: Secondary | ICD-10-CM

## 2019-09-27 DIAGNOSIS — R918 Other nonspecific abnormal finding of lung field: Secondary | ICD-10-CM

## 2019-09-27 NOTE — Progress Notes (Signed)
Occupational Therapy Discharge Summary  Patient Details  Name: Arthur [name] MRN: [id number] Date of Birth: 1981-08-06  Patient has met 11 of 11 long term goals due to improved activity tolerance, improved balance and postural control.  Patient to discharge at overall CGA for dynamic standing balance and self-care tasks requiring standing and Supervision for all seated self-care tasks. level.  Patient's care partner is independent to provide the necessary physical and cognitive assistance at discharge.  Patient's mother present for caregiver education and asked appropriate questions that were answered to her satisfaction.  Reasons goals not met: N/A  Recommendation:  Patient will benefit from ongoing skilled OT services in home health setting to continue to advance functional skills in the area of BADL and Reduce care partner burden.  Equipment: 3 in 1 and tub transfer bench  Reasons for discharge: treatment goals met and discharge from hospital  Patient/family agrees with progress made and goals achieved: Yes  OT Discharge Precautions/Restrictions  Precautions Precautions: Fall Precaution Comments: PEG, R eye visual field loss Pain Pain Assessment Pain Scale: 0-10 Pain Score: 0-No pain ADL ADL Eating: Supervision/safety, Minimal cueing Grooming: Supervision/safety, Minimal cueing Where Assessed-Grooming: Sitting at sink Upper Body Bathing: Supervision/safety Where Assessed-Upper Body Bathing: Sitting at sink Lower Body Bathing: Contact guard Where Assessed-Lower Body Bathing: Standing at sink Upper Body Dressing: Supervision/safety, Setup Where Assessed-Upper Body Dressing: Sitting at sink Lower Body Dressing: Contact guard Where Assessed-Lower Body Dressing: Standing at sink, Sitting at sink Toileting: Contact guard Where Assessed-Toileting: Toilet, Bedside Commode Toilet Transfer: Therapist, music Method: Arts development officer: Research officer, political party: Not assessed Vision Baseline Vision/History: No visual deficits Patient Visual Report: Other (comment)(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) Vision Assessment?: Vision impaired- to be further tested in functional context Perception  Perception: Within Functional Limits Praxis Praxis: Intact Cognition Overall Cognitive Status: Impaired/Different from baseline Arousal/Alertness: Awake/alert Orientation Level: Oriented X4 Attention: Selective Selective Attention: Appears intact Alternating Attention: Appears intact Memory: Impaired Memory Impairment: Decreased recall of new information;Retrieval deficit Awareness: Impaired Awareness Impairment: Emergent impairment Problem Solving: Impaired Problem Solving Impairment: Functional complex;Verbal complex Safety/Judgment: Impaired Comments: impulsive with mobility Sensation Sensation Light Touch: Appears Intact Proprioception: Impaired by gross assessment Coordination Gross Motor Movements are Fluid and Coordinated: No Fine Motor Movements are Fluid and Coordinated: No Coordination and Movement Description: uncoordinated due to LE weakness, balance, and mild ataxia Finger Nose Finger Test: Mild dysmetria L>R Extremity/Trunk Assessment RUE Assessment RUE Assessment: Within Functional Limits Active Range of Motion (AROM) Comments: WNL General Strength Comments: 4-/5 grossly LUE Assessment LUE Assessment: Exceptions to [facility] Active Range of Motion (AROM) Comments: <150 degrees shoulder flexion + abduction General Strength Comments: 3+/5 grossly   Arthur [name], [facility] 09/27/2019, 10:59 AM

## 2019-09-27 NOTE — Progress Notes (Signed)
Patient discharged home with mom. Mom expressed confidence in ability to care for him, no questions or concerns at this time.

## 2019-09-27 NOTE — Progress Notes (Signed)
Arthur [name] PHYSICAL MEDICINE & REHABILITATION PROGRESS NOTE   Subjective/Complaints: Patient seen sitting up in bed this morning.  He states he did not sleep well overnight because all he would think about was going home.  Discussed yesterday's imaging with patient and need to discuss with ID, however patient states he is going home.  He notes abdominal pain has resolved with ice pack. Discussed with ID.    ROS: Denies CP, SOB, N/V/D  Objective:   CT ABDOMEN WO CONTRAST  Result Date: 09/26/2019 CLINICAL DATA:  Abdominal pain in area of percutaneous gastrostomy. HIV. EXAM: CT ABDOMEN WITHOUT CONTRAST TECHNIQUE: Multidetector CT imaging of the abdomen was performed following the standard protocol without IV contrast. COMPARISON:  08/31/2019 FINDINGS: Lower chest: Several new ill-defined nodular opacities are seen which measure 11 mm in the right lower lobe, 8 mm in the inferior lingula, and 1.6 cm the left lower lobe. New patchy areas of airspace disease are seen in the left lower lobe. There is associated radiodense substance in the left lung base which is also new, and suspicious for aspirated barium. Hepatobiliary: No masses visualized on this unenhanced exam. One or 2 tiny calcified gallstones are noted, however there is no evidence of cholecystitis or biliary ductal dilatation. Pancreas: No mass or inflammatory process visualized on this unenhanced exam. Spleen:  Within normal limits in size. Adrenals/Urinary tract: Unremarkable. No evidence of nephrolithiasis or hydronephrosis. Stomach/Bowel: Percutaneous gastrostomy tube is seen in appropriate position in the distal stomach. No evidence of intraperitoneal air or abnormal fluid collections. Visualized abdominal bowel loops are unremarkable. Vascular/Lymphatic: No pathologically enlarged lymph nodes identified. No evidence of abdominal aortic aneurysm. Other:  None. Musculoskeletal:  No suspicious bone lesions identified. IMPRESSION: 1. Percutaneous  gastrostomy tube in appropriate position. No acute findings within the abdomen or pelvis. 2. Cholelithiasis. No radiographic evidence of cholecystitis. 3. New patchy areas of airspace disease and radiodense substance in left lower lobe, suspicious for aspirated barium/aspiration pneumonia. Several bibasilar pulmonary nodular opacities are also new, and likely infectious or inflammatory in etiology. Recommend continued follow-up by chest CT in 2-3 months. Electronically Signed   By: Danae Orleans M.D.   On: 09/26/2019 13:15   CT CHEST W CONTRAST  Result Date: 09/26/2019 CLINICAL DATA:  Abnormal lung findings on abdominal CT EXAM: CT CHEST WITH CONTRAST TECHNIQUE: Multidetector CT imaging of the chest was performed during intravenous contrast administration. CONTRAST:  22mL OMNIPAQUE IOHEXOL 300 MG/ML  SOLN COMPARISON:  Abdominal CT 09/26/2019 FINDINGS: Cardiovascular: No significant vascular findings. Normal heart size. No pericardial effusion. Mediastinum/Nodes: There are mildly enlarged bilateral axillary lymph nodes. Mildly enlarged bilateral hilar lymph nodes, left greater than right. No mediastinal lymphadenopathy. Thyroid gland unremarkable. Trachea unremarkable. Esophagus unremarkable. Lungs/Pleura: Redemonstration of ill-defined airspace consolidations within the bilateral lung bases, left greater than right, some of which have a more nodular appearance. There are prominent tree-in-bud opacities within the lower lobes and lingula. Scattered radiodense material in the inferior left lower lobe likely aspirated barium from recent swallow study. There are scattered areas of tree-in-bud and ground-glass opacity within the bilateral upper lobes. No pleural effusion or pneumothorax. Upper Abdomen: No acute findings within the visualized upper abdomen. Musculoskeletal: Bilateral gynecomastia. No acute osseous findings. IMPRESSION: 1. Ill-defined airspace consolidations within the bilateral lung bases, left  greater than right, some of which have a more nodular appearance. There are additional scattered areas of tree-in-bud and ground-glass opacity throughout both lungs including within the bilateral upper lobes. Findings are favored to represent an  infectious or inflammatory process, including atypical mycobacterial infection. Recommend follow-up chest CT in 2-3 months to assess for resolution. 2. Mildly enlarged bilateral hilar lymph nodes, left greater than right, likely reactive. 3. Small amount of radiodense material within the dependent portion of the left lung base, likely aspirated barium. 4. Mildly enlarged bilateral axillary lymph nodes, nonspecific. 5. Bilateral gynecomastia. Electronically Signed   By: Davina Poke D.O.   On: 09/26/2019 19:58   Recent Labs    09/26/19 0925  WBC 4.0  HGB 10.7*  HCT 32.1*  PLT 350   Recent Labs    09/26/19 0925  NA 136  K 3.8  CL 103  CO2 25  GLUCOSE 60*  BUN 6  CREATININE 0.78  CALCIUM 8.7*    Intake/Output Summary (Last 24 hours) at 09/27/2019 0814 Last data filed at 09/27/2019 0520 Gross per 24 hour  Intake 1060 ml  Output 2300 ml  Net -1240 ml     Physical Exam: Vital Signs Blood pressure 111/78, pulse 70, temperature 98.1 F (36.7 C), temperature source Oral, resp. rate 16, weight 55.1 kg, SpO2 100 %.  Constitutional: No distress . Vital signs reviewed. Cachectic. HENT: Normocephalic.  Atraumatic. Eyes: EOMI. No discharge. Cardiovascular: No JVD. Respiratory: Normal effort.  No stridor. GI: Non-distended. +PEG Skin: Warm and dry.  Intact. Psych: Normal mood.  Normal behavior. Musc: No edema in extremities.  No tenderness in extremities. Neurological: Alert Dysarthria, improving Motor: Grossly 4-4+/5 throughout, improving  Assessment/Plan: 1. 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  Physiatrist is providing  close team supervision and 24 hour management of active medical problems listed below.  Physiatrist and rehab team continue to assess barriers to discharge/monitor patient progress toward functional and medical goals  Care Tool:  Bathing  Bathing activity did not occur: Refused Body parts bathed by patient: Right arm, Left arm, Chest, Abdomen, Front perineal area, Right upper leg, Left upper leg, Face, Buttocks   Body parts bathed by helper: Buttocks, Right lower leg, Left lower leg     Bathing assist Assist Level: Contact Guard/Touching assist     Upper Body Dressing/Undressing Upper body dressing   What is the patient wearing?: Pull over shirt    Upper body assist Assist Level: Supervision/Verbal cueing    Lower Body Dressing/Undressing Lower body dressing      What is the patient wearing?: Pants     Lower body assist Assist for lower body dressing: Contact Guard/Touching assist     Toileting Toileting Toileting Activity did not occur (Clothing management and hygiene only): Refused  Toileting assist Assist for toileting: Minimal Assistance - Patient > 75% Assistive Device Comment: bedside commode   Transfers Chair/bed transfer  Transfers assist     Chair/bed transfer assist level: Contact Guard/Touching assist     Locomotion Ambulation   Ambulation assist      Assist level: Contact Guard/Touching assist Assistive device: Walker-rolling Max distance: 147ft   Walk 10 feet activity   Assist     Assist level: Contact Guard/Touching assist Assistive device: Walker-rolling   Walk 50 feet activity   Assist    Assist level: Contact Guard/Touching assist Assistive device: Walker-rolling    Walk 150 feet activity   Assist Walk 150 feet activity did not occur: Safety/medical concerns  Assist level: Contact Guard/Touching assist Assistive device: Walker-rolling    Walk 10 feet on uneven surface  activity   Assist Walk 10  feet on uneven  surfaces activity did not occur: Safety/medical concerns   Assist level: Contact Guard/Touching assist Assistive device: Photographer Will patient use wheelchair at discharge?: Yes Type of Wheelchair: Manual    Wheelchair assist level: Supervision/Verbal cueing Max wheelchair distance: 170ft    Wheelchair 50 feet with 2 turns activity    Assist        Assist Level: Supervision/Verbal cueing   Wheelchair 150 feet activity     Assist      Assist Level: Supervision/Verbal cueing   Blood pressure 111/78, pulse 70, temperature 98.1 F (36.7 C), temperature source Oral, resp. rate 16, weight 55.1 kg, SpO2 100 %.  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  Results suggested likely barium aspiration and bibasal pulmonary nodular opacities.  Discussed with PA, who also had discussion with patient and mother.  CT chest was ordered and reviewed with ID, which suggests ill-defined airspace consolidations within bilateral lung bases left greater than right, some of which with nodular appearance.  Additionally, scattered areas of tree-in-bud and groundglass opacities throughout bilateral lungs, including upper lobes.  Per ID, likely treated/being treated by current medications and without respiratory issues, no change in management at present.  Confirmed lack of any respiratory issues with patient and nursing.  Will need follow-up CT in 2 months as outpatient.  Discussed with patient's mother.  Given the above, will DC patient today.  Patient adamant on leaving today as well.  Will see patient for transitional care management in 1-2 weeks post-discharge 2. Antithrombotics: -DVT/anticoagulation:Pharmaceutical:Lovenox -antiplatelet therapy: N/A 3. Pain  Management:Tramadol or Tylenol prn for abdominal pain. 4. Mood:LCSW to follow for evaluation and support -antipsychotic agents: N/A 5. Neuropsych: This patientis not fullycapable of making decisions on hisown behalf. 6. Skin/Wound Care:routine pressure relief measures 7. Fluids/Electrolytes/Nutrition: 8. AIDS/HIV: Continue Tivicay + Descovy, 1 tab daily   Continue Bactrim 20 mL 2 times per week  9. CNS toxoplasmosis: Discussed with pharmacy.   Continue Pyrimethamine-Leucovorin 50/25 daily    Sulfadiazine decreased to 1000 mg twice daily  Discussed medications with ID 10. Malnutrition/Dysphagia: see above.  PEG placed on 09/02/2019  Unable to tolerate bolus tube feeds, converted back to continuous.  DCed tube feeds  Advanced to regular diet thins with full supervision, double portions, eating well 11. Hypomagnesemia: Supplement as needed. added supplement thru peg, increased on 12/18.   Mag 1.7 on 12/31  Continue to monitor 12.  Hypotension  Slowly improving on 1/1  Midodrine increased to 5 TID on 09/11/2019, communicated with pharmacy  Likely be able to wean as outpatient. 13.  Hyponatremia: Resolved  Sodium 136 on 12/31  Continue to monitor 14.  Leukopenia: Resolved  WBCs 4.0 on 12/31  Continue to monitor 15.  Acute blood loss anemia  Hemoglobin 10.7 on 12/31  Continue to monitor 16.  Slow transit constipation  Bowel meds increased on 12/16  Added MiraLAX  Improved 17. Dizziness:   Started meclizine to see if this provides relief during therapy sessions   Improved  > 30 minutes spent spent in total between myself and PA regarding counseling and coordination of care for HIV medications, abdominal pain, CT findings, potential further interventions, etc.  LOS: 20 days A FACE TO FACE EVALUATION WAS PERFORMED  Jacquelene Kopecky Karis Juba 09/27/2019, 8:14 AM

## 2019-09-30 ENCOUNTER — Other Ambulatory Visit: Payer: Self-pay

## 2019-09-30 ENCOUNTER — Encounter: Payer: Self-pay | Admitting: Infectious Disease

## 2019-09-30 ENCOUNTER — Telehealth: Payer: Self-pay | Admitting: *Deleted

## 2019-09-30 ENCOUNTER — Ambulatory Visit (INDEPENDENT_AMBULATORY_CARE_PROVIDER_SITE_OTHER): Payer: Self-pay | Admitting: Infectious Disease

## 2019-09-30 VITALS — BP 112/74 | HR 100 | Temp 98.3°F

## 2019-09-30 DIAGNOSIS — B582 Toxoplasma meningoencephalitis: Secondary | ICD-10-CM

## 2019-09-30 DIAGNOSIS — Z931 Gastrostomy status: Secondary | ICD-10-CM

## 2019-09-30 DIAGNOSIS — E43 Unspecified severe protein-calorie malnutrition: Secondary | ICD-10-CM

## 2019-09-30 DIAGNOSIS — R634 Abnormal weight loss: Secondary | ICD-10-CM

## 2019-09-30 DIAGNOSIS — H547 Unspecified visual loss: Secondary | ICD-10-CM

## 2019-09-30 DIAGNOSIS — F0280 Dementia in other diseases classified elsewhere without behavioral disturbance: Secondary | ICD-10-CM

## 2019-09-30 DIAGNOSIS — R918 Other nonspecific abnormal finding of lung field: Secondary | ICD-10-CM

## 2019-09-30 DIAGNOSIS — B20 Human immunodeficiency virus [HIV] disease: Secondary | ICD-10-CM

## 2019-09-30 DIAGNOSIS — A523 Neurosyphilis, unspecified: Secondary | ICD-10-CM

## 2019-09-30 MED ORDER — DESCOVY 200-25 MG PO TABS: 1.0000 | ORAL_TABLET | Freq: Every day | ORAL | 2 refills | Status: DC

## 2019-09-30 MED ORDER — LEUCOVORIN CALCIUM 25 MG PO TABS: 25.0000 mg | ORAL_TABLET | Freq: Four times a day (QID) | ORAL | 11 refills | Status: DC

## 2019-09-30 MED ORDER — SULFAMETHOXAZOLE-TRIMETHOPRIM 800-160 MG PO TABS: 1.0000 | ORAL_TABLET | ORAL | 11 refills | Status: DC

## 2019-09-30 MED ORDER — FAMOTIDINE 20 MG PO TABS: 20.0000 mg | ORAL_TABLET | Freq: Every day | ORAL | 11 refills | Status: DC

## 2019-09-30 MED ORDER — PYRIMETHAMINE 25 MG PO TABS: 50.0000 mg | ORAL_TABLET | Freq: Every day | ORAL | 5 refills | Status: DC

## 2019-09-30 MED ORDER — SULFADIAZINE 500 MG PO TABS: 1000.0000 mg | ORAL_TABLET | Freq: Two times a day (BID) | ORAL | 5 refills | Status: DC

## 2019-09-30 MED ORDER — TIVICAY 50 MG PO TABS: 50.0000 mg | ORAL_TABLET | Freq: Every day | ORAL | 2 refills | Status: DC

## 2019-09-30 NOTE — Progress Notes (Signed)
Patient was tearful when talking about his HIV diagnosis Subjective:    Patient ID: Arthur [name], male    DOB: 25-Oct-1980, 39 y.o.   MRN: [id number]  HPI   Arthur [name] is a 39 year old African-American man living with HIV who unfortunately fell out of care and was not seen by me since 2015.  He previously failed Atripla with resistance and IK 103 when he is being seen by Dr. Tomma Lightning.  I change him to a protease-based regimen with Prezista Norvir and Truvada but he had not been seen since 2015.  He was admitted to the hospital with profound encephalopathy.  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  He was also broadly treated for other possible CNS pathology including CMV encephalitis with ganciclovir RI PE for possible tuberculosis with steroids.  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  He was ultimately discharged from the inpatient hospital service to inpatient rehab and completed therapy while there.  Profound dysphagia and nutritional deficiency he was given a PEG tube through which she was getting his medications along with nutrition.  He is currently no longer using the PEG tube and is taking his medications by mouth.  He is living at home with his mother and also his sister who is a CMA and have been overseeing his medications.  Unfortunately the sulfadiazine and pyrimethamine leucovorin are still not filled from when he was discharged in the hospital.  Apparently some of this has to do with the fact that the pyrimethamine and leucovorin does not come as a combination product at the [facility] [address].  I have sent a new prescriptions for them today.  He had scans done to assess his PEG tube and some nausea that he was suffering from which had shown some nodules in his lungs.  I reviewed these findings with the [facility] physicians and did not feel that the patient had evidence of  active infection that needed to be treated in the lungs.  We can follow-up on this images in the future.  During the interview he did become quite tearful at 1 point in time he did acknowledge having great family support but said that he had no friends.  I suggested that it would be very beneficial for him to consider [place] and gave him and his sister information about this.  Arthur [name] from [facility] also met with the patient.  Past Medical History:  Diagnosis Date  . Anemia   . Anxiety   . Depression   . GERD (gastroesophageal reflux disease)   . HIV infection ([place])   . Substance abuse [facility])     Past Surgical History:  Procedure Laterality Date  . IR GASTROSTOMY TUBE MOD SED  09/02/2019  . TOOTH EXTRACTION      Family History  Problem Relation Age of Onset  . Diabetes Neg Hx   . CAD Neg Hx   . Hypertension Neg Hx       Social History   Socioeconomic History  . Marital status: Single    Spouse name: Not on file  . Number of children: Not on file  . Years of education: Not on file  . Highest education level: Not on file  Occupational History  . Not on file  Tobacco Use  . Smoking status: Current Every Day Smoker    Packs/day: 0.40    Years: 17.00    Pack years:  6.80  . Smokeless tobacco: Never Used  Substance and Sexual Activity  . Alcohol use: No    Comment: occassionally  . Drug use: Yes    Types: Marijuana  . Sexual activity: Not Currently    Partners: Male    Comment: pt. declined condoms  Other Topics Concern  . Not on file  Social History Narrative  . Not on file   Social Determinants of Health   Financial Resource Strain:   . Difficulty of Paying Living Expenses: Not on file  Food Insecurity:   . Worried About Charity fundraiser in the Last Year: Not on file  . Ran Out of Food in the Last Year: Not on file  Transportation Needs:   . Lack of Transportation (Medical): Not on file  . Lack of Transportation (Non-Medical):  Not on file  Physical Activity:   . Days of Exercise per Week: Not on file  . Minutes of Exercise per Session: Not on file  Stress:   . Feeling of Stress : Not on file  Social Connections:   . Frequency of Communication with Friends and Family: Not on file  . Frequency of Social Gatherings with Friends and Family: Not on file  . Attends Religious Services: Not on file  . Active Member of Clubs or Organizations: Not on file  . Attends Archivist Meetings: Not on file  . Marital Status: Not on file    No Known Allergies   Current Outpatient Medications:  .  acetaminophen (TYLENOL) 325 MG tablet, Take 1-2 tablets (325-650 mg total) by mouth every 4 (four) hours as needed for mild pain., Disp:  , Rfl:  .  dolutegravir (TIVICAY) 50 MG tablet, Take 1 tablet (50 mg total) by mouth daily., Disp: 30 tablet, Rfl: 2 .  emtricitabine-tenofovir AF (DESCOVY) 200-25 MG tablet, Take 1 tablet by mouth daily., Disp: 30 tablet, Rfl: 2 .  famotidine (PEPCID) 40 MG/5ML suspension, Take 2.5 mLs (20 mg total) by mouth daily., Disp: 75 mL, Rfl: 0 .  magnesium oxide (MAG-OX) 400 (241.3 Mg) MG tablet, Take 1 tablet (400 mg total) by mouth 2 (two) times daily., Disp: 60 tablet, Rfl: 0 .  meclizine (ANTIVERT) 25 MG tablet, Take 1 tablet (25 mg total) by mouth daily at 6 (six) AM., Disp: 30 tablet, Rfl: 0 .  Menthol-Methyl Salicylate (MUSCLE RUB) 10-15 % CREA, Apply 1 application topically as needed for muscle pain., Disp:  , Rfl: 0 .  midodrine (PROAMATINE) 5 MG tablet, Take 1 tablet (5 mg total) by mouth 3 (three) times daily with meals., Disp: 90 tablet, Rfl: 0 .  Multiple Vitamin (MULTIVITAMIN) LIQD, Take 15 mLs by mouth daily., Disp: 473 mL, Rfl: 0 .  ondansetron (ZOFRAN) 4 MG tablet, Take 1 tablet (4 mg total) by mouth every 6 (six) hours as needed for nausea., Disp: 20 tablet, Rfl: 0 .  polyethylene glycol (MIRALAX / GLYCOLAX) 17 g packet, Take 17 g by mouth daily., Disp: 30 each, Rfl: 0 .   Pyrimethamine-Leucovorin 50-25 MG CAPS, Take 1 capsule by mouth daily., Disp: 30 capsule, Rfl: 2 .  senna (SENOKOT) 8.6 MG TABS tablet, Take 2 tablets (17.2 mg total) by mouth 2 (two) times daily., Disp: 120 tablet, Rfl: 0 .  sulfaDIAZINE 500 MG tablet, Take 2 tablets (1,000 mg total) by mouth 2 (two) times daily., Disp: 120 tablet, Rfl: 2 .  sulfamethoxazole-trimethoprim (BACTRIM) 200-40 MG/5ML suspension, Take 20 mLs by mouth 3 (three) times a week. On Mondays,  Wednesdays and Fridays, Disp: 300 mL, Rfl: 0 .  terbinafine (LAMISIL) 1 % cream, Apply topically daily. Apply to feet daily, Disp: 30 g, Rfl: 0 .  traMADol (ULTRAM) 50 MG tablet, Place 1 tablet (50 mg total) into feeding tube every 6 (six) hours as needed for moderate pain., Disp: 28 tablet, Rfl: 0 .  Water For Irrigation, Sterile (FREE WATER) SOLN, Place 100 mLs into feeding tube 2 (two) times daily after a meal., Disp: 1000 mL, Rfl: 1   Review of Systems  Unable to perform ROS: Acuity of condition       Objective:   Physical Exam Constitutional:      Appearance: He is cachectic.  HENT:     Head: Normocephalic.     Nose: Nose normal.  Eyes:     Extraocular Movements: Extraocular movements intact.  Cardiovascular:     Rate and Rhythm: Normal rate and regular rhythm.  Pulmonary:     Effort: Pulmonary effort is normal. No respiratory distress.  Abdominal:     General: There is distension.  Neurological:     Mental Status: He is alert.  Psychiatric:        Attention and Perception: Attention normal.        Mood and Affect: Mood is anxious and depressed. Affect is labile and tearful.        Speech: Speech is delayed.        Behavior: Behavior is cooperative.        Cognition and Memory: Cognition is impaired.           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  Continue Bactrim for PCP prevention but changed to actual tablets 1 tablet 3 times weekly  Neurosyphilis: His completed treatment with intravenous penicillin we will follow-up serial RPR as of the next months.  Cachexia: Continue to try to boost his nutrition hopefully with support at home from his mother and sister this can be accomplished.  Depression: I would guess that much of this has to do with the loneliness and also probably stigma though he denies suffering from stigma.  Certainly also sounds from talk to his sister that some of his tearfulness has been after he had his CNS infection so this could be some cognitive component to this.  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  Hopefully he can recover as much as possible.  Certainly will fill out a handicap placard for him so that he can be driven close to his appointments and so that him getting in and out of wheelchairs to his appointments can be facilitated.  We will make sure he is reenrolled in the [place] [facility] program today.  I spent greater than 40 minutes with the patient including greater than 50% of time in face to face counsel of the patient and his sister regarding the nature of his HIV medications his HIV disease the infections that we are treating goals of care and ambition of return to is much of a normal life as possible.  I am encouraged by his recovery

## 2019-09-30 NOTE — Telephone Encounter (Addendum)
Mr Arthur [name] called about a missing medication. I attempted to complete his Transitional care call, but his mother  asked that we call back tomorrow. He had a doctor appt this morning and the medication was handled. Call completed 10/01/19.

## 2019-10-01 ENCOUNTER — Other Ambulatory Visit: Payer: Self-pay | Admitting: *Deleted

## 2019-10-01 DIAGNOSIS — B582 Toxoplasma meningoencephalitis: Secondary | ICD-10-CM

## 2019-10-01 LAB — T-HELPER CELL (CD4) - (RCID CLINIC ONLY)
CD4 % Helper T Cell: 6 % — ABNORMAL LOW (ref 33–65)
CD4 T Cell Abs: 68 /uL — ABNORMAL LOW (ref 400–1790)

## 2019-10-01 MED ORDER — LEUCOVORIN CALCIUM 25 MG PO TABS: 25.0000 mg | ORAL_TABLET | Freq: Every day | ORAL | 5 refills | Status: DC

## 2019-10-01 MED ORDER — PYRIMETHAMINE 25 MG PO TABS: 50.0000 mg | ORAL_TABLET | Freq: Every day | ORAL | 5 refills | Status: DC

## 2019-10-01 NOTE — Telephone Encounter (Signed)
Transitional Care call--I spoke with his mother Kathi Der.    1. Are you/is patient experiencing any problems since coming home? NO. Are there any questions regarding any aspect of care?NO. 2. Are there any questions regarding medications administration/dosing? NO. Are meds being taken as prescribed? YES. Patient should review meds with caller to confirm Yes, he has them all. There were two missing but he saw DR [name] on 09/30/19 and got them taken care of. 3. Have there been any falls? NO. 4. Has Home Health been to the house and/or have they contacted you? YES. If not, have you tried to contact them? N/A. Can we help you contact them? NO.They opened the case today. Therapy will be out Friday. 5. Are bowels and bladder emptying properly? YES. Are there any unexpected incontinence issues? NO. If applicable, is patient following bowel/bladder programs? NO PROBLEMS. 6. Any fevers, problems with breathing, unexpected pain? NO. He is having very little pain. 7. Are there any skin problems or new areas of breakdown? NO, he is looking forward to getting the peg tube out. 8. Has the patient/family member arranged specialty MD follow up (ie cardiology/neurology/renal/surgical/etc)? NO. Can we help arrange?All appointments have been managed. I gave return to see Dr Carlis Abbott in our office. 9. Does the patient need any other services or support that we can help arrange? NO. 10. Are caregivers following through as expected in assisting the patient? YES. 11. Has the patient quit smoking, drinking alcohol, or using drugs as recommended? YES.  Appointment on Tuesday 10/08/19 @ 9:40  Arrive by 9:20 to see Dr Carlis Abbott. Address reviewed and alerted to watch for packet in his mail address. [id number].

## 2019-10-01 NOTE — Progress Notes (Signed)
Received prior authorization request for patient's daraprim. He has ADAP, this is covered, but must be sent to specialty pharmacy with leucovorin ([facility] it). RN spoke with Candise Bowens at [facility] location. She will call Birdie's mother now and set up overnight delivery so patient can start tomorrow. Per Dr [name], resent Leucovorin prescription as 25 mg daily instead of q 6 hours. Andree Coss, RN

## 2019-10-02 ENCOUNTER — Telehealth: Payer: Self-pay | Admitting: *Deleted

## 2019-10-02 NOTE — Progress Notes (Signed)
Patient ID: Arthur [name], male   DOB: [birth year], 39 y.o.   MRN: [id number]  Virtual Visit via Telephone Note  I connected with Arthur [name] on 10/03/19 at  9:50 AM EST by telephone and verified that I am speaking with the correct person using two identifiers.   I discussed the limitations, risks, security and privacy concerns of performing an evaluation and management service by telephone and the availability of in person appointments. I also discussed with the patient that there may be a patient responsible charge related to this service. The patient expressed understanding and agreed to proceed.  Patient location:  home My Location:  [place] office Persons on the call:  Me and the patient and his mom.     History of Present Illness: After hospitalization 09/07/2019-09/27/2019.  The patient is tolerating food into his feeding tube.  He is feeling better.  No fever.  Does not need any med RF currently.  He saw ID 09/30/2019 and they did labs.     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 Past admission physiatrist, therapy team and rehab RN have worked together to provide customized collaborative inpatient rehab. He was maintained on tube feeds dysphagia treatment ongoing.  His swallow function has improved and he has been advanced to dysphagia 3 thin liquids with chin tuck.  Tube feeds were discontinued his  p.o. intake has been good and he continued to report poor tolerance of tube feeds with nausea, vomiting and abdominal discomfort. Follow-up check of BMP shows lytes and renal status to be within normal limits.  Mag-Ox was added due to hypomagnesemia and dose increased 12/18 with most recent Mg - 1.7.  CBC shows leukopenia resolving and H&H is stable overall.  Blood pressures are stable on midodrine.  Low-dose meclizine was added in a.m. to help with dizziness in the mornings and to help with activity tolerance.  Bowel program has been adjusted to help manage slow transit constipation.  He was maintained on Pyrimethamine-Leucovorin through 12/30 and is to continues on sulfasalazine.   His PEG site is clean, dry without signs of infection but he continued to have ongoing abdominal pain, therefore radiology was consulted for input.  CT abdomen was recommended for work-up as physical exam was benign.  This showed PEG tube to be in appropriate position without acute findings in abdomen or pelvis.  It did reveal new patchy areas of airspace disease in radiodense substance in LLL suspicious for aspirated barium and several bibasilar pulmonary nodular opacities likely infectious or inflammatory in etiology.  ID was consulted for input and recommended checking CT chest for full work-up and to consult pulmonary for input if  nodular densities noted.   CT chest showed ill-defined airspace consolidation within bilateral lung bases some with more nodular appearance is an additional scattered areas of tree-in-bud and groundglass opacity in both lungs including bilateral upper lobes.  Findings  are favored to represent infectious or inflammatory process and follow-up CT in 2 to 3 months recommended.  Results of chest CT discussed with ID who felt that patient was being treated with current medication and as he did not have any respiratory issues no change in management at present.  Tramadol as needed in addition to ice packs  were recommended for local measures to help manage pain. He has made gains during rehab stay and will continue to receive further follow-up HHPT, HHOT, HHST and HHRN by [facility] after discharge.    Rehab course: During patient's stay in rehab weekly team conferences were held to monitor patient's progress, set goals and discuss barriers to discharge. At admission, patient required mod assist with basic ADL task and with mobility.  He exhibited mild to moderate cognitive deficits with delayed processing and increased latency.  He also presented with severe dysarthria with significantly weakened vocal intensity and decreased speech intelligibility.  He has had improvement in activity tolerance, balance, postural control as well as ability to compensate for deficits.  He is able to complete ADL tasks with supervision.  He requires contact-guard assist for dynamic standing balance to complete ADL tasks. He is able to perform stand pivot transfers without assistive device and with contact-guard assist.  He is able to ambulate 150 feet and climb 8 stairs with rolling walker and contact-guard assist. He is able to complete ADL tasks with supervision.  Requires contact-guard assist for dynamic standing balance to complete ADL tasks  Family education was completed with mother regarding all aspects of care and safety.       Observations/Objective:  NAD.  Garbled speech by patient.     Assessment and Plan: 1. HIV DISEASE Patient on regimen with meds and is compliant  2. On tube feeding diet Compliant-denies any redness at sight or fever/tenderness  3. Hospital discharge follow-up Current labs to be followed by ID.     Follow Up Instructions: Assign PCP in 6-8 weeks.  Patient aware of all f/up appts   I discussed the assessment and treatment plan with the patient. The patient was provided an opportunity to ask questions and all were answered. The patient agreed with the plan and  demonstrated an understanding of the instructions.   The patient was advised to call back or seek an in-person evaluation if the symptoms worsen or if the condition fails to improve as anticipated.  I provided 9 minutes of non-face-to-face time during this encounter.   Georgian Co, PA-C

## 2019-10-03 ENCOUNTER — Other Ambulatory Visit: Payer: Self-pay

## 2019-10-03 ENCOUNTER — Ambulatory Visit: Payer: Medicaid Other | Attending: Family Medicine | Admitting: Physician Assistant

## 2019-10-03 DIAGNOSIS — Z09 Encounter for follow-up examination after completed treatment for conditions other than malignant neoplasm: Secondary | ICD-10-CM

## 2019-10-03 DIAGNOSIS — Z789 Other specified health status: Secondary | ICD-10-CM | POA: Diagnosis not present

## 2019-10-03 DIAGNOSIS — B20 Human immunodeficiency virus [HIV] disease: Secondary | ICD-10-CM | POA: Diagnosis not present

## 2019-10-03 NOTE — Telephone Encounter (Signed)
Patient was unable to pick up medication at hospital/rehab discharge 09/27/19. He came to clinic 1/4 with his mother and sister.  RN [facility] who stated they were unable to pick up the pyrimethamine-leucovorin because it needs to be written as 2 separate medications.  Dr [name] resent the medication as directed. On 1/5, RN received prior authorization request for pyrimethamine.  This is covered by ADAP, but local is unable to fill this - it must come from adap specialty pharmacy. RN sent this to [facility] who arranged overnight delivery for 1/6 by noon. On 1/6, patient still had not received the medication. [facility] requested [facility] to [facility] the patient's mother for more information.  On 1/7, RN reached out to the mother. She had not received it by lunch. RN called back at 4:30, confirmed that she has received sulfadiazine, Bactrim, leucovorin, daraprim today. Andree Coss, RN

## 2019-10-04 LAB — CBC WITH DIFFERENTIAL/PLATELET
Absolute Monocytes: 621 cells/uL (ref 200–950)
Basophils Absolute: 51 cells/uL (ref 0–200)
Basophils Relative: 0.9 %
Eosinophils Absolute: 279 cells/uL (ref 15–500)
Eosinophils Relative: 4.9 %
HCT: 33.9 % — ABNORMAL LOW (ref 38.5–50.0)
Hemoglobin: 11.6 g/dL — ABNORMAL LOW (ref 13.2–17.1)
Lymphs Abs: 1260 cells/uL (ref 850–3900)
MCH: 32.6 pg (ref 27.0–33.0)
MCHC: 34.2 g/dL (ref 32.0–36.0)
MCV: 95.2 fL (ref 80.0–100.0)
MPV: 9 fL (ref 7.5–12.5)
Monocytes Relative: 10.9 %
Neutro Abs: 3488 cells/uL (ref 1500–7800)
Neutrophils Relative %: 61.2 %
Platelets: 401 10*3/uL — ABNORMAL HIGH (ref 140–400)
RBC: 3.56 10*6/uL — ABNORMAL LOW (ref 4.20–5.80)
RDW: 16.1 % — ABNORMAL HIGH (ref 11.0–15.0)
Total Lymphocyte: 22.1 %
WBC: 5.7 10*3/uL (ref 3.8–10.8)

## 2019-10-04 LAB — COMPLETE METABOLIC PANEL WITH GFR
AG Ratio: 0.7 (calc) — ABNORMAL LOW (ref 1.0–2.5)
ALT: 15 U/L (ref 9–46)
AST: 20 U/L (ref 10–40)
Albumin: 3.1 g/dL — ABNORMAL LOW (ref 3.6–5.1)
Alkaline phosphatase (APISO): 70 U/L (ref 36–130)
BUN/Creatinine Ratio: 8 (calc) (ref 6–22)
BUN: 5 mg/dL — ABNORMAL LOW (ref 7–25)
CO2: 24 mmol/L (ref 20–32)
Calcium: 8.7 mg/dL (ref 8.6–10.3)
Chloride: 103 mmol/L (ref 98–110)
Creat: 0.62 mg/dL (ref 0.60–1.35)
GFR, Est African American: 148 mL/min/{1.73_m2} (ref >=60)
GFR, Est Non African American: 128 mL/min/{1.73_m2} (ref >=60)
Globulin: 4.7 g/dL (calc) — ABNORMAL HIGH (ref 1.9–3.7)
Glucose, Bld: 74 mg/dL (ref 65–99)
Potassium: 4.1 mmol/L (ref 3.5–5.3)
Sodium: 137 mmol/L (ref 135–146)
Total Bilirubin: 0.2 mg/dL (ref 0.2–1.2)
Total Protein: 7.8 g/dL (ref 6.1–8.1)

## 2019-10-04 LAB — HIV-1 RNA QUANT-NO REFLEX-BLD
HIV 1 RNA Quant: <20 copies/mL — AB
HIV-1 RNA Quant, Log: <1.3 Log copies/mL — AB

## 2019-10-04 LAB — PHOSPHORUS: Phosphorus: 3.1 mg/dL (ref 2.5–4.5)

## 2019-10-04 LAB — RPR TITER: RPR Titer: 1:64 {titer} — ABNORMAL HIGH

## 2019-10-04 LAB — FLUORESCENT TREPONEMAL AB(FTA)-IGG-BLD: Fluorescent Treponemal ABS: REACTIVE — AB

## 2019-10-04 LAB — RPR: RPR Ser Ql: REACTIVE — AB

## 2019-10-04 NOTE — Telephone Encounter (Signed)
Thanks [place]. Arthur [name] I think this is an example of where we probably should have had the months supply of these drugs before he left so that we wouldn't run into this nonsense outside the hospital. He has now gone more than a week without treatment for his toxo

## 2019-10-05 ENCOUNTER — Emergency Department (HOSPITAL_COMMUNITY)
Admission: EM | Admit: 2019-10-05 | Discharge: 2019-10-06 | Disposition: A | Discharge status: Home / Self Care | Payer: Medicaid Other | Attending: Emergency Medicine | Admitting: Emergency Medicine

## 2019-10-05 ENCOUNTER — Other Ambulatory Visit: Payer: Self-pay

## 2019-10-05 DIAGNOSIS — B582 Toxoplasma meningoencephalitis: Secondary | ICD-10-CM

## 2019-10-05 DIAGNOSIS — R519 Headache, unspecified: Secondary | ICD-10-CM

## 2019-10-05 DIAGNOSIS — Z79899 Other long term (current) drug therapy: Secondary | ICD-10-CM | POA: Insufficient documentation

## 2019-10-05 DIAGNOSIS — D539 Nutritional anemia, unspecified: Secondary | ICD-10-CM | POA: Diagnosis not present

## 2019-10-05 DIAGNOSIS — E876 Hypokalemia: Secondary | ICD-10-CM | POA: Diagnosis not present

## 2019-10-05 DIAGNOSIS — F17200 Nicotine dependence, unspecified, uncomplicated: Secondary | ICD-10-CM | POA: Insufficient documentation

## 2019-10-05 DIAGNOSIS — B20 Human immunodeficiency virus [HIV] disease: Secondary | ICD-10-CM

## 2019-10-06 ENCOUNTER — Other Ambulatory Visit: Payer: Self-pay

## 2019-10-06 ENCOUNTER — Encounter (HOSPITAL_COMMUNITY): Payer: Self-pay | Admitting: Emergency Medicine

## 2019-10-06 LAB — CBC WITH DIFFERENTIAL/PLATELET
Abs Immature Granulocytes: 0.35 10*3/uL — ABNORMAL HIGH (ref 0.00–0.07)
Basophils Absolute: 0.1 10*3/uL (ref 0.0–0.1)
Basophils Relative: 1 %
Eosinophils Absolute: 0.2 10*3/uL (ref 0.0–0.5)
Eosinophils Relative: 4 %
HCT: 33.9 % — ABNORMAL LOW (ref 39.0–52.0)
Hemoglobin: 10.8 g/dL — ABNORMAL LOW (ref 13.0–17.0)
Immature Granulocytes: 6 %
Lymphocytes Relative: 23 %
Lymphs Abs: 1.3 10*3/uL (ref 0.7–4.0)
MCH: 32.5 pg (ref 26.0–34.0)
MCHC: 31.9 g/dL (ref 30.0–36.0)
MCV: 102.1 fL — ABNORMAL HIGH (ref 80.0–100.0)
Monocytes Absolute: 0.7 10*3/uL (ref 0.1–1.0)
Monocytes Relative: 12 %
Neutro Abs: 3 10*3/uL (ref 1.7–7.7)
Neutrophils Relative %: 54 %
Platelets: 409 10*3/uL — ABNORMAL HIGH (ref 150–400)
RBC: 3.32 MIL/uL — ABNORMAL LOW (ref 4.22–5.81)
RDW: 16.7 % — ABNORMAL HIGH (ref 11.5–15.5)
WBC: 5.5 10*3/uL (ref 4.0–10.5)
nRBC: 1.3 % — ABNORMAL HIGH (ref 0.0–0.2)

## 2019-10-06 LAB — BASIC METABOLIC PANEL
Anion gap: 10 (ref 5–15)
BUN: 5 mg/dL — ABNORMAL LOW (ref 6–20)
CO2: 19 mmol/L — ABNORMAL LOW (ref 22–32)
Calcium: 8.7 mg/dL — ABNORMAL LOW (ref 8.9–10.3)
Chloride: 108 mmol/L (ref 98–111)
Creatinine, Ser: 0.84 mg/dL (ref 0.61–1.24)
GFR calc Af Amer: >60 mL/min (ref >60)
GFR calc non Af Amer: >60 mL/min (ref >60)
Glucose, Bld: 112 mg/dL — ABNORMAL HIGH (ref 70–99)
Potassium: 3.3 mmol/L — ABNORMAL LOW (ref 3.5–5.1)
Sodium: 137 mmol/L (ref 135–145)

## 2019-10-06 NOTE — Discharge Instructions (Addendum)
Continue to take acetaminophen as needed.  Return if you are having any problems.  Continue taking the antibiotic as prescribed.

## 2019-10-06 NOTE — ED Notes (Signed)
Caregiver (mother) verbalized understanding of d/c instructions, follow up care and s/s requiring return to ED. Caregiver did not have any further questions at this time.

## 2019-10-06 NOTE — ED Triage Notes (Signed)
Patient reports headache yesterday unrelieved by OTC Tylenol , no fever or chills , denies emesis or photophobia .

## 2019-10-06 NOTE — ED Provider Notes (Signed)
[facility] EMERGENCY DEPARTMENT Provider Note   CSN: [id number] Arrival date & time: 10/05/19  2319   History Chief Complaint  Patient presents with  . Headache    Arthur [name] is a 39 y.o. male.  The history is provided by the patient and a parent.  Headache He has history of HIV disease with CNS toxoplasmosis and comes in because of a right-sided headache which started yesterday and got worse today.  He rated the pain at 8/10.  There is no associated photophobia or phonophobia.  There is no nausea or vomiting.  There is no visual change.  His mother gave him a dose of acetaminophen and, when it did not give him relief within a short period of time, she decided to bring him in for further evaluation.  Since arrival in the ED, he the headache has completely resolved.  He is completely pain-free now.  Mother states that his mental state is at his baseline.  Past Medical History:  Diagnosis Date  . Anemia   . Anxiety   . Depression   . GERD (gastroesophageal reflux disease)   . HIV infection (HCC)   . Substance abuse [facility])     Patient Active Problem List   Diagnosis Date Noted  . Lung consolidation (HCC)   . Ground glass opacity present on imaging of lung   . AIDS (HCC)   . AIDS dementia complex (HCC)   . S/P percutaneous endoscopic gastrostomy (PEG) tube placement (HCC)   . Arterial hypotension   . Slow transit constipation   . Labile blood glucose   . Hypomagnesemia   . Acute blood loss anemia   . Leucopenia   . Labile blood pressure   . HIV disease (HCC)   . Debility 09/07/2019  . Aspiration into airway   . Palliative care by specialist   . DNR (do not resuscitate) discussion   . Weakness generalized   . Dysphagia   . Protein-calorie malnutrition, severe 08/19/2019  . 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 Goals of care, counseling/discussion   . Bacterial meningitis   . Thrush of mouth and esophagus (HCC)   . Palliative care encounter   .  HIV (human immunodeficiency virus infection) (HCC) 07/29/2019  . Vision loss 07/29/2019  . Abnormal brain MRI 07/29/2019  . Acquired immunodeficiency syndrome (HCC) 07/28/2019  . Bradycardia 07/28/2019  . Hyponatremia 07/28/2019  . Nausea and vomiting 03/25/2013  . Muscle spasm 03/25/2013  . Drug use 01/11/2012  . 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 COUGH 06/23/2009  . CHEST PAIN, PLEURITIC 06/23/2009  . FUNGAL DERMATITIS 05/28/2008  . GERD 05/28/2008  . DENTAL CARIES 02/13/2008  . NICOTINE ADDICTION 11/14/2007  . BACK STRAIN, LUMBAR 08/22/2007  . 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 WEIGHT LOSS, RECENT 05/01/2007  . ANXIETY DEPRESSION 02/14/2007  . HIV DISEASE 02/13/2007  . CERVICAL LYMPHADENOPATHY, ANTERIOR, RIGHT 02/13/2007    Past Surgical History:  Procedure Laterality Date  . IR GASTROSTOMY TUBE MOD SED  09/02/2019  . TOOTH EXTRACTION         Family History  Problem Relation Age of Onset  . Diabetes Neg Hx   . CAD Neg Hx   . Hypertension Neg Hx     Social History   Tobacco Use  . Smoking status: Current Every Day Smoker    Packs/day: 0.40    Years: 17.00    Pack years: 6.80  . Smokeless tobacco: Never Used  Substance Use Topics  . Alcohol use: No  Comment: occassionally  . Drug use: Yes    Types: Marijuana    Home Medications Prior to Admission medications   Medication Sig Start Date End Date Taking? Authorizing Provider  acetaminophen (TYLENOL) 325 MG tablet Take 1-2 tablets (325-650 mg total) by mouth every 4 (four) hours as needed for mild pain. 09/26/19   Love, Ivan Anchors, PA-C  dolutegravir (TIVICAY) 50 MG tablet Take 1 tablet (50 mg total) by mouth daily. 09/30/19   Truman Hayward, MD  emtricitabine-tenofovir AF (DESCOVY) 200-25 MG tablet Take 1 tablet by mouth daily. 09/30/19   Truman Hayward, MD  famotidine (PEPCID) 20 MG tablet Take 1 tablet (20 mg total) by mouth at bedtime. 09/30/19   Truman Hayward, MD  leucovorin (WELLCOVORIN) 25 MG  tablet Take 1 tablet (25 mg total) by mouth daily. Patient not taking: Reported on 10/03/2019 10/01/19   Tommy Medal, Lavell Islam, MD  meclizine (ANTIVERT) 25 MG tablet Take 1 tablet (25 mg total) by mouth daily at 6 (six) AM. 09/27/19   Love, Ivan Anchors, PA-C  Menthol-Methyl Salicylate (MUSCLE RUB) 10-15 % CREA Apply 1 application topically as needed for muscle pain. 09/26/19   Love, Ivan Anchors, PA-C  midodrine (PROAMATINE) 5 MG tablet Take 1 tablet (5 mg total) by mouth 3 (three) times daily with meals. 09/26/19   Love, Ivan Anchors, PA-C  Multiple Vitamin (MULTIVITAMIN) LIQD Take 15 mLs by mouth daily. 09/26/19   Love, Ivan Anchors, PA-C  ondansetron (ZOFRAN) 4 MG tablet Take 1 tablet (4 mg total) by mouth every 6 (six) hours as needed for nausea. 09/26/19   Love, Ivan Anchors, PA-C  polyethylene glycol (MIRALAX / GLYCOLAX) 17 g packet Take 17 g by mouth daily. 09/27/19   Love, Ivan Anchors, PA-C  pyrimethamine (DARAPRIM) 25 MG tablet Take 2 tablets (50 mg total) by mouth daily with breakfast. Patient not taking: Reported on 10/03/2019 10/01/19   Tommy Medal, Lavell Islam, MD  senna (SENOKOT) 8.6 MG TABS tablet Take 2 tablets (17.2 mg total) by mouth 2 (two) times daily. 09/26/19   Love, Ivan Anchors, PA-C  sulfaDIAZINE 500 MG tablet Take 2 tablets (1,000 mg total) by mouth 2 (two) times daily. 09/30/19   Truman Hayward, MD  sulfamethoxazole-trimethoprim (BACTRIM DS) 800-160 MG tablet Take 1 tablet by mouth 3 (three) times a week. 09/30/19   Truman Hayward, MD  terbinafine (LAMISIL) 1 % cream Apply topically daily. Apply to feet daily Patient not taking: Reported on 09/30/2019 09/26/19   Love, Ivan Anchors, PA-C  traMADol (ULTRAM) 50 MG tablet Place 1 tablet (50 mg total) into feeding tube every 6 (six) hours as needed for moderate pain. 09/26/19   Bary Leriche, PA-C    Allergies    Patient has no known allergies.  Review of Systems   Review of Systems  Neurological: Positive for headaches.  All other systems reviewed and are  negative.   Physical Exam Updated Vital Signs BP 103/77 (BP Location: Right Arm)   Pulse 83   Temp 98.2 F (36.8 C) (Oral)   Resp 18   SpO2 96%   Physical Exam Vitals and nursing note reviewed.   39 year old male, resting comfortably and in no acute distress. Vital signs are normal. Oxygen saturation is 96%, which is normal. Head is normocephalic and atraumatic.  Eyes are slightly disconjugate.  Pupils are 3 mm.  Left pupil reacts to light, right pupil does not (he is blind in  the right eye). Oropharynx is clear. Neck is nontender and supple without adenopathy or JVD. Back is nontender and there is no CVA tenderness. Lungs are clear without rales, wheezes, or rhonchi. Chest is nontender. Heart has regular rate and rhythm without murmur. Abdomen is soft, flat, nontender without masses or hepatosplenomegaly and peristalsis is normoactive.  PEG tube present in the upper abdomen. Extremities have no cyanosis or edema, full range of motion is present. Skin is warm and dry without rash. Neurologic: Awake and alert, slightly slurred speech which is apparently his baseline, cranial nerves are intact, there are no motor or sensory deficits.  ED Results / Procedures / Treatments   Labs (all labs ordered are listed, but only abnormal results are displayed) Labs Reviewed  CBC WITH DIFFERENTIAL/PLATELET - Abnormal; Notable for the following components:      Result Value   RBC 3.32 (*)    Hemoglobin 10.8 (*)    HCT 33.9 (*)    MCV 102.1 (*)    RDW 16.7 (*)    Platelets 409 (*)    nRBC 1.3 (*)    Abs Immature Granulocytes 0.35 (*)    All other components within normal limits  BASIC METABOLIC PANEL - Abnormal; Notable for the following components:   Potassium 3.3 (*)    CO2 19 (*)    Glucose, Bld 112 (*)    BUN 5 (*)    Calcium 8.7 (*)    All other components within normal limits   Procedures Procedures  Medications Ordered in ED Medications - No data to display  ED Course  I  have reviewed the triage vital signs and the nursing notes.  Pertinent lab results that were available during my care of the patient were reviewed by me and considered in my medical decision making (see chart for details).  MDM Rules/Calculators/A&P Headache which has resolved.  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  Old records were reviewed confirming diagnosis of CNS toxoplasmosis.  He is currently on pyrimethamine and leucovorin daily which she is supposed to stay on for 6 week course for induction therapy.  No evidence of any acute neurologic event.  With headache having completely resolved, no indication for imaging tonight.  Mother is advised to continue using acetaminophen as needed for pain.  Labs obtained today show stable anemia, borderline hypokalemia.  Final Clinical Impression(s) / ED Diagnoses Final diagnoses:  Bad headache  Macrocytic anemia  Hypokalemia  CNS toxoplasmosis (HCC)  HIV disease (HCC)    Rx / DC Orders ED Discharge Orders    None       Dione Booze, MD 10/06/19 0210

## 2019-10-07 NOTE — Telephone Encounter (Signed)
Thanks for the update.  Unfortunately our TOC pharmacy cannot fill HMAP medication - only [facility] can fill.  Glad he has the meds now.

## 2019-10-07 NOTE — Telephone Encounter (Signed)
You could prolly fill it if it is via pharma assistance but of course pharma assistance for toxo drugs prolly not so seamless (if it even exists) vs for ex ARV like [employer]. I think if it was during a week day and or I was formally following pt with Arthur [name] for ex we could have more ably double checked this, maybe deploy bridge counselor to get meds from [facility] (and then we would have found out they didn't have em etc)

## 2019-10-08 ENCOUNTER — Other Ambulatory Visit: Payer: Self-pay

## 2019-10-08 ENCOUNTER — Encounter
Payer: Medicaid Other | Attending: Physical Medicine and Rehabilitation | Admitting: Physical Medicine and Rehabilitation

## 2019-10-08 ENCOUNTER — Encounter: Payer: Self-pay | Admitting: Physical Medicine and Rehabilitation

## 2019-10-08 VITALS — BP 100/65 | HR 114 | Temp 97.5°F | Ht 71.0 in | Wt 130.0 lb

## 2019-10-08 DIAGNOSIS — Z789 Other specified health status: Secondary | ICD-10-CM

## 2019-10-08 DIAGNOSIS — B582 Toxoplasma meningoencephalitis: Secondary | ICD-10-CM | POA: Diagnosis not present

## 2019-10-08 DIAGNOSIS — R519 Headache, unspecified: Secondary | ICD-10-CM

## 2019-10-08 DIAGNOSIS — R64 Cachexia: Secondary | ICD-10-CM | POA: Diagnosis not present

## 2019-10-08 DIAGNOSIS — Z7409 Other reduced mobility: Secondary | ICD-10-CM | POA: Diagnosis not present

## 2019-10-08 MED ORDER — SUMATRIPTAN SUCCINATE 50 MG PO TABS: 50.0000 mg | ORAL_TABLET | ORAL | 0 refills | Status: DC | PRN

## 2019-10-08 NOTE — Progress Notes (Signed)
Subjective:    Patient ID: DEJA KAIGLER, male    DOB: 09/30/1980, 39 y.o.   MRN: [id number]  HPI  Mr. Vasil is a 39 year old man who presents for transitional care follow-up after CIR admission for CNS toxoplasmosis. He is accompanied by his mother.  He has been doing well at home, but has only received one home OT session thus far. He has been ambulating around his home with rolling walker. He denies falls. His sister has been home with him during the day and his mother is home with him in the evenings.   He had one episode of severe headache on Saturday night for which he went to the ED. He tried taking two Tylenol before he went. His mother states that he was discharged from the ED without further treatment and his headache resolved after 3 hours. He has some Tramadol still but has not required it.   For joy, he watches TV and plays with his neices and nephews.   Pain Inventory Average Pain 0 Pain Right Now 0 My pain is no pain  In the last 24 hours, has pain interfered with the following? General activity 0 Relation with others 0 Enjoyment of life 0 What TIME of day is your pain at its worst? no pain Sleep (in general) Fair  Pain is worse with: no pain Pain improves with: no pain Relief from Meds: no pain  Mobility walk with assistance use a walker  Function disabled: date disabled .  Neuro/Psych bladder control problems bowel control problems weakness trouble walking  Prior Studies transitional care  Physicians involved in your care transitional care   Family History  Problem Relation Age of Onset  . Diabetes Neg Hx   . CAD Neg Hx   . Hypertension Neg Hx    Social History   Socioeconomic History  . Marital status: Single    Spouse name: Not on file  . Number of children: Not on file  . Years of education: Not on file  . Highest education level: Not on file  Occupational History  . Not on file  Tobacco Use  . Smoking status: Current Every  Day Smoker    Packs/day: 0.40    Years: 17.00    Pack years: 6.80  . Smokeless tobacco: Never Used  Substance and Sexual Activity  . Alcohol use: No    Comment: occassionally  . Drug use: Yes    Types: Marijuana  . Sexual activity: Not Currently    Partners: Male    Comment: pt. declined condoms  Other Topics Concern  . Not on file  Social History Narrative  . Not on file   Social Determinants of Health   Financial Resource Strain:   . Difficulty of Paying Living Expenses: Not on file  Food Insecurity:   . Worried About Charity fundraiser in the Last Year: Not on file  . Ran Out of Food in the Last Year: Not on file  Transportation Needs:   . Lack of Transportation (Medical): Not on file  . Lack of Transportation (Non-Medical): Not on file  Physical Activity:   . Days of Exercise per Week: Not on file  . Minutes of Exercise per Session: Not on file  Stress:   . Feeling of Stress : Not on file  Social Connections:   . Frequency of Communication with Friends and Family: Not on file  . Frequency of Social Gatherings with Friends and Family: Not on file  .  Attends Religious Services: Not on file  . Active Member of Clubs or Organizations: Not on file  . Attends Banker Meetings: Not on file  . Marital Status: Not on file   Past Surgical History:  Procedure Laterality Date  . IR GASTROSTOMY TUBE MOD SED  09/02/2019  . TOOTH EXTRACTION     Past Medical History:  Diagnosis Date  . Anemia   . Anxiety   . Depression   . GERD (gastroesophageal reflux disease)   . HIV infection (HCC)   . Substance abuse (HCC)    BP 100/65   Pulse (!) 114   Temp (!) 97.5 F (36.4 C)   Ht 5\' 11"  (1.803 m)   Wt 130 lb (59 kg)   SpO2 91%   BMI 18.13 kg/m   Opioid Risk Score:   Fall Risk Score:  `1  Depression screen PHQ 2/9  Depression screen [facility] 2/9 11/15/2013 06/26/2013 03/25/2013 02/04/2013  Decreased Interest 0 2 0 0  Down, Depressed, Hopeless 0 1 0 1  PHQ - 2  Score 0 3 0 1  Altered sleeping - 2 - -  Tired, decreased energy - 2 - -  Change in appetite - 1 - -  Feeling bad or failure about yourself  - 1 - -  Trouble concentrating - 2 - -  Moving slowly or fidgety/restless - 2 - -  Suicidal thoughts - 0 - -  PHQ-9 Score - 13 - -    Review of Systems  Constitutional: Positive for fatigue.  Respiratory: Negative.   Cardiovascular: Negative.   Gastrointestinal: Negative.   Endocrine: Negative.   Genitourinary: Negative.   Musculoskeletal: Positive for gait problem.  Skin: Negative.   Allergic/Immunologic: Negative.   Neurological: Positive for headaches.  Psychiatric/Behavioral: Positive for confusion and decreased concentration.  All other systems reviewed and are negative.      Objective:   Physical Exam Blood pressure 111/78, pulse 70, temperature 98.1 F (36.7 C), temperature source Oral, resp. rate 16, weight 55.1 kg, SpO2 100 %.  Constitutional: No distress . Vital signs reviewed.  HENT: Normocephalic.  Atraumatic. Eyes: EOMI. No discharge. Cardiovascular: No JVD. Respiratory: Normal effort.  No stridor. GI: Non-distended. +PEG Skin: Warm and dry.  Intact. Psych: Normal mood.  Normal behavior. Musc: No edema in extremities.  No tenderness in extremities. Neurological: Alert Dysarthria, improving Motor: Grossly 4-4+/5. Slow gait, able to walk without RW but loses balance requiring MinA after turn.      Assessment & Plan:  Mr. Bordelon is a 39 year old man who presents for transitional care follow-up after CIR admission for CNS toxoplasmosis.  1) Impaired mobility and ADLs -Continue home PT and OT, Provided with clinic phone number so if both therapies are not provided by next week he can call me and I will speak to his home care company.  2) Headache episodic --Most days he has been headache free but on Saturday he did require ED visit. Prescribed Sumatriptan to help break headaches that are resistant to Tylenol in future.    3) CNS toxoplasmosis --He continues on antibiotics prescribed by Dr. Tuesday.  4) Cachexia --Much improved, has excellent appetite. Plan for PEG removal later this month.    All questions answered. Very happy with his progress! Return to clinic PRN.

## 2019-10-10 ENCOUNTER — Other Ambulatory Visit: Payer: Self-pay

## 2019-10-10 ENCOUNTER — Ambulatory Visit: Payer: Self-pay

## 2019-10-10 ENCOUNTER — Ambulatory Visit: Payer: Self-pay | Attending: Internal Medicine

## 2019-10-10 DIAGNOSIS — Z20822 Contact with and (suspected) exposure to covid-19: Secondary | ICD-10-CM

## 2019-10-10 DIAGNOSIS — F4323 Adjustment disorder with mixed anxiety and depressed mood: Secondary | ICD-10-CM | POA: Diagnosis not present

## 2019-10-11 ENCOUNTER — Encounter: Payer: Self-pay | Admitting: Infectious Disease

## 2019-10-11 LAB — NOVEL CORONAVIRUS, NAA: SARS-CoV-2, NAA: NOT DETECTED

## 2019-10-14 ENCOUNTER — Other Ambulatory Visit: Payer: Self-pay

## 2019-10-14 ENCOUNTER — Encounter: Payer: Self-pay | Admitting: Infectious Disease

## 2019-10-14 ENCOUNTER — Ambulatory Visit (INDEPENDENT_AMBULATORY_CARE_PROVIDER_SITE_OTHER): Payer: Self-pay | Admitting: Infectious Disease

## 2019-10-14 VITALS — BP 106/71 | HR 98 | Temp 97.9°F | Ht 71.0 in | Wt 135.0 lb

## 2019-10-14 DIAGNOSIS — R634 Abnormal weight loss: Secondary | ICD-10-CM

## 2019-10-14 DIAGNOSIS — A523 Neurosyphilis, unspecified: Secondary | ICD-10-CM

## 2019-10-14 DIAGNOSIS — B20 Human immunodeficiency virus [HIV] disease: Secondary | ICD-10-CM

## 2019-10-14 DIAGNOSIS — H547 Unspecified visual loss: Secondary | ICD-10-CM

## 2019-10-14 DIAGNOSIS — Z931 Gastrostomy status: Secondary | ICD-10-CM

## 2019-10-14 DIAGNOSIS — B582 Toxoplasma meningoencephalitis: Secondary | ICD-10-CM

## 2019-10-14 NOTE — Progress Notes (Signed)
Chief complaint follow-up for HIV disease and CNS toxoplasmosis  Subjective:    Patient ID: Arthur [name], male    DOB: 1980-10-24, 39 y.o.   MRN: [id number]  HPI   Arthur [name] is a 39 year old African-American man living with HIV who unfortunately fell out of care and was not seen by me since 2015.  He previously failed Atripla with resistance and IK 103 when he is being seen by Dr. Philipp Deputy.  I change him to a protease-based regimen with Prezista Norvir and Truvada but he had not been seen since 2015.  He was admitted to the hospital with profound encephalopathy.  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  He was also broadly treated for other possible CNS pathology including CMV encephalitis with ganciclovir RI PE for possible tuberculosis with steroids.  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  He was ultimately discharged from the inpatient hospital service to inpatient rehab and completed therapy while there.  Profound dysphagia and nutritional deficiency he was given a PEG tube through which she was getting his medications along with nutrition.  He is currently no longer using the PEG tube and is taking his medications by mouth.  He is living at home with his mother and also his sister who is a CMA and have been overseeing his medications.  Unfortunately the sulfadiazine and pyrimethamine leucovorin are still not filled from when he was discharged in the hospital.  Apparently some of this has to do with the fact that the pyrimethamine and leucovorin does not come as a combination product at the [facility] [address].  I have sent a new prescriptions for them  at last visit He had scans done to assess his PEG tube and some nausea that he was suffering from which had shown some nodules in his lungs.  I reviewed these findings with the [facility] physicians and did not feel that the patient  had evidence of active infection that needed to be treated in the lungs.  We can follow-up on this images in the future.  He had still quite a bit of trouble getting him the correct medicines for toxoplasmosis and ultimately they had to be mailed from specialty pharmacy in [place] but he obtained them and is taking them.  His neurological function is improving and he is able to walk now.  His mood seems improved as well.  His viral load is now undetectable and his CD4 count has doubled going from 35 into the 60s Is accompanied today by his mother. Past Medical History:  Diagnosis Date  . Anemia   . Anxiety   . Depression   . GERD (gastroesophageal reflux disease)   . HIV infection (HCC)   . Substance abuse [facility])     Past Surgical History:  Procedure Laterality Date  . IR GASTROSTOMY TUBE MOD SED  09/02/2019  . TOOTH EXTRACTION      Family History  Problem Relation Age of Onset  . Diabetes Neg Hx   . CAD Neg Hx   . Hypertension Neg Hx       Social History   Socioeconomic History  . Marital status: Single    Spouse name: Not on file  . Number of children: Not on file  . Years of education: Not on file  . Highest education level: Not on file  Occupational History  . Not on file  Tobacco Use  . Smoking status:  Current Every Day Smoker    Packs/day: 0.40    Years: 17.00    Pack years: 6.80  . Smokeless tobacco: Never Used  Substance and Sexual Activity  . Alcohol use: No    Comment: occassionally  . Drug use: Yes    Types: Marijuana  . Sexual activity: Not Currently    Partners: Male    Comment: pt. declined condoms  Other Topics Concern  . Not on file  Social History Narrative  . Not on file   Social Determinants of Health   Financial Resource Strain:   . Difficulty of Paying Living Expenses: Not on file  Food Insecurity:   . Worried About Programme researcher, broadcasting/film/video in the Last Year: Not on file  . Ran Out of Food in the Last Year: Not on file  Transportation  Needs:   . Lack of Transportation (Medical): Not on file  . Lack of Transportation (Non-Medical): Not on file  Physical Activity:   . Days of Exercise per Week: Not on file  . Minutes of Exercise per Session: Not on file  Stress:   . Feeling of Stress : Not on file  Social Connections:   . Frequency of Communication with Friends and Family: Not on file  . Frequency of Social Gatherings with Friends and Family: Not on file  . Attends Religious Services: Not on file  . Active Member of Clubs or Organizations: Not on file  . Attends Banker Meetings: Not on file  . Marital Status: Not on file    No Known Allergies   Current Outpatient Medications:  .  dolutegravir (TIVICAY) 50 MG tablet, Take 1 tablet (50 mg total) by mouth daily., Disp: 30 tablet, Rfl: 2 .  emtricitabine-tenofovir AF (DESCOVY) 200-25 MG tablet, Take 1 tablet by mouth daily., Disp: 30 tablet, Rfl: 2 .  famotidine (PEPCID) 20 MG tablet, Take 1 tablet (20 mg total) by mouth at bedtime., Disp: 30 tablet, Rfl: 11 .  leucovorin (WELLCOVORIN) 25 MG tablet, Take 1 tablet (25 mg total) by mouth daily., Disp: 30 tablet, Rfl: 5 .  meclizine (ANTIVERT) 25 MG tablet, Take 1 tablet (25 mg total) by mouth daily at 6 (six) AM., Disp: 30 tablet, Rfl: 0 .  Menthol-Methyl Salicylate (MUSCLE RUB) 10-15 % CREA, Apply 1 application topically as needed for muscle pain., Disp:  , Rfl: 0 .  midodrine (PROAMATINE) 5 MG tablet, Take 1 tablet (5 mg total) by mouth 3 (three) times daily with meals., Disp: 90 tablet, Rfl: 0 .  ondansetron (ZOFRAN) 4 MG tablet, Take 1 tablet (4 mg total) by mouth every 6 (six) hours as needed for nausea., Disp: 20 tablet, Rfl: 0 .  pyrimethamine (DARAPRIM) 25 MG tablet, Take 2 tablets (50 mg total) by mouth daily with breakfast., Disp: 60 tablet, Rfl: 5 .  sulfaDIAZINE 500 MG tablet, Take 2 tablets (1,000 mg total) by mouth 2 (two) times daily., Disp: 120 tablet, Rfl: 5 .  sulfamethoxazole-trimethoprim  (BACTRIM DS) 800-160 MG tablet, Take 1 tablet by mouth 3 (three) times a week., Disp: 12 tablet, Rfl: 11 .  SUMAtriptan (IMITREX) 50 MG tablet, Take 1 tablet (50 mg total) by mouth every 2 (two) hours as needed for migraine. May repeat in 2 hours if headache persists or recurs., Disp: 10 tablet, Rfl: 0 .  terbinafine (LAMISIL) 1 % cream, Apply topically daily. Apply to feet daily, Disp: 30 g, Rfl: 0   Review of Systems  Unable to perform ROS: Mental  status change       Objective:   Physical Exam Constitutional:      Appearance: He is cachectic.  HENT:     Head: Normocephalic.     Nose: Nose normal.  Eyes:     Extraocular Movements: Extraocular movements intact.  Cardiovascular:     Rate and Rhythm: Normal rate and regular rhythm.  Pulmonary:     Effort: Pulmonary effort is normal. No respiratory distress.  Neurological:     Mental Status: He is alert.  Psychiatric:        Attention and Perception: Attention normal.        Mood and Affect: Mood is depressed.        Speech: Speech is delayed.        Behavior: Behavior is cooperative.        Cognition and Memory: Cognition is impaired.           Assessment & Plan:   CNS toxoplasmosis: Continue with pyrimethamine and sulfadiazine along with leucovorin for at least 6 months and with immune reconstitution  HIV disease AIDS: Continue TIVICAY and DESCOVY but consolidate to [place] in the future.  Continue Bactrim for PCP prevention but changed to actual tablets 1 tablet 3 times weekly  Neurosyphilis: His completed treatment with intravenous penicillin we will follow-up serial RPR as of the next months.  Cachexia: Continue to try to boost his nutrition hopefully with support at home from his mother and sister this can be accomplished.  Depression: Counseled to go to [facility] [place] and he is certainly open to this and his mother also is interested in him going to it.  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  Hopefully he can recover as much as possible.

## 2019-10-17 ENCOUNTER — Telehealth: Payer: Self-pay

## 2019-10-17 ENCOUNTER — Other Ambulatory Visit: Payer: Self-pay | Admitting: Physical Medicine and Rehabilitation

## 2019-10-17 ENCOUNTER — Telehealth: Payer: Self-pay | Admitting: *Deleted

## 2019-10-17 DIAGNOSIS — Z931 Gastrostomy status: Secondary | ICD-10-CM

## 2019-10-17 NOTE — Telephone Encounter (Signed)
Patient called stating that [facility] [name] services has not came out to his house. I called [name] and they have the referral and PT and OT has gone out once to do eval. Patient has no insurance so he is a Arthur [name] patient for them. They are planning more visits but they are limited. Patient is aware.

## 2019-10-17 NOTE — Telephone Encounter (Signed)
Patient is ready to have Gtube removed. This was placed by Interventional Radiology. They need an order to remove this.  Please advise. Andree Coss, RN

## 2019-10-17 NOTE — Telephone Encounter (Signed)
I placed order in Epic, not sure if this is the order needed, happy to sign document or give verbal order instead if this is preferred!

## 2019-10-17 NOTE — Telephone Encounter (Signed)
Order needs to be sent for patient to have PEG tube removed.

## 2019-10-18 ENCOUNTER — Other Ambulatory Visit: Payer: Self-pay | Admitting: Internal Medicine

## 2019-10-18 ENCOUNTER — Other Ambulatory Visit: Payer: Self-pay | Admitting: Physical Medicine and Rehabilitation

## 2019-10-18 DIAGNOSIS — R1310 Dysphagia, unspecified: Secondary | ICD-10-CM

## 2019-10-18 NOTE — Telephone Encounter (Signed)
Patient's sister called office to follow up on orders for PEG tube removal.

## 2019-10-18 NOTE — Telephone Encounter (Signed)
The order has been placed.

## 2019-10-18 NOTE — Telephone Encounter (Signed)
Needs to be refilled and managed by patients primary care provider. 

## 2019-10-18 NOTE — Progress Notes (Signed)
peg

## 2019-10-21 NOTE — Telephone Encounter (Signed)
Notified Morrie Sheldon in IR that order was placed, ok to contact family to schedule. Andree Coss, RN

## 2019-10-24 ENCOUNTER — Telehealth: Payer: Self-pay | Admitting: *Deleted

## 2019-10-24 ENCOUNTER — Other Ambulatory Visit: Payer: Self-pay | Admitting: Infectious Disease

## 2019-10-24 ENCOUNTER — Ambulatory Visit: Payer: Self-pay

## 2019-10-24 ENCOUNTER — Other Ambulatory Visit: Payer: Self-pay

## 2019-10-24 DIAGNOSIS — F4323 Adjustment disorder with mixed anxiety and depressed mood: Secondary | ICD-10-CM | POA: Diagnosis not present

## 2019-10-24 NOTE — Telephone Encounter (Signed)
I would not fill any of those

## 2019-10-24 NOTE — Telephone Encounter (Signed)
Patient's refill request for midodrine, meclizine, magnesium oxide were denied (sent to discharging [facility]).  Per mom, Arthur [name] is no longer experiencing dizziness.  His magnesium oxide prescription is no longer on active med list.  She is not sure what his blood pressures have been lately, but systolic is low 100's, diastolic 60-77 at January visits.  She understands he needs to schedule another follow up with [facility] for pcp follow up/blood pressure management. She is calling for an appointment. Please advise if ok to fill any of these in the mean time. Andree Coss, RN

## 2019-10-24 NOTE — Telephone Encounter (Signed)
1) Medication(s) Requested (by name): -midodrine (PROAMATINE) 5 MG tablet  -meclizine (ANTIVERT) 25 MG tablet  -magnesium oxide (MAG-OX) tablet 400 mg     2) Pharmacy of Choice: -[address] [place], [place] [address]  3) Special Requests: -Until appt 11/12/2019

## 2019-10-24 NOTE — Telephone Encounter (Signed)
His Mom has reached out to [facility], too.  Thanks!

## 2019-10-25 MED ORDER — MECLIZINE HCL 25 MG PO TABS: 25.0000 mg | ORAL_TABLET | Freq: Every day | ORAL | 0 refills | Status: DC

## 2019-10-25 MED ORDER — MIDODRINE HCL 5 MG PO TABS: 5.0000 mg | ORAL_TABLET | Freq: Three times a day (TID) | ORAL | 0 refills | Status: DC

## 2019-10-25 NOTE — Telephone Encounter (Signed)
No Mag Ox on profile. Other refills sent.

## 2019-10-25 NOTE — Telephone Encounter (Signed)
Patient mother is calling to refill medication, patient has appointment to establish care on 02/16, patient only needs enough to last until appointment.  Medication(s) Requested (by name): -midodrine (PROAMATINE) 5 MG tablet  -meclizine (ANTIVERT) 25 MG tablet  -magnesium oxide (MAG-OX) tablet 400 mg    Pharmacy of choice: [facility] [phone] [facility] [place], [place] - [address]  [address] [place] [address]

## 2019-10-28 ENCOUNTER — Ambulatory Visit (HOSPITAL_COMMUNITY)
Admission: RE | Admit: 2019-10-28 | Discharge: 2019-10-28 | Disposition: A | Discharge status: Home / Self Care | Payer: Medicaid Other | Source: Ambulatory Visit | Attending: Internal Medicine | Admitting: Internal Medicine

## 2019-10-28 ENCOUNTER — Other Ambulatory Visit: Payer: Self-pay

## 2019-10-28 DIAGNOSIS — Z431 Encounter for attention to gastrostomy: Secondary | ICD-10-CM | POA: Insufficient documentation

## 2019-10-28 DIAGNOSIS — R1310 Dysphagia, unspecified: Secondary | ICD-10-CM | POA: Diagnosis not present

## 2019-10-28 HISTORY — PX: IR GASTROSTOMY TUBE REMOVAL: IMG5492

## 2019-10-28 MED ORDER — LIDOCAINE VISCOUS HCL 2 % MT SOLN
OROMUCOSAL | Status: AC
  Filled 2019-10-28: qty 15

## 2019-10-28 MED ORDER — LIDOCAINE VISCOUS HCL 2 % MT SOLN
OROMUCOSAL | Status: DC | PRN
  Administered 2019-10-28: 15 mL via OROMUCOSAL

## 2019-10-28 NOTE — Procedures (Signed)
Patient presented for gastrostomy tube removal.  Patient s/p 52F pull-through gastrostomy tube placement 09/02/19.  He has not used in weeks and has been eating and drinking PO with tolerance.   Viscous lidocaine applied to tract.  Tube removed in its entirety with manual traction. EBL 0 mL.  No complications.  Dressing applied.  Instructions given.   Loyce Dys, MS RD PA-C 9:46 AM

## 2019-11-12 ENCOUNTER — Other Ambulatory Visit: Payer: Self-pay

## 2019-11-12 ENCOUNTER — Encounter: Payer: Self-pay | Admitting: Nurse Practitioner

## 2019-11-12 ENCOUNTER — Ambulatory Visit: Payer: Medicaid Other | Attending: Nurse Practitioner | Admitting: Nurse Practitioner

## 2019-11-12 DIAGNOSIS — H543 Unqualified visual loss, both eyes: Secondary | ICD-10-CM | POA: Diagnosis not present

## 2019-11-12 DIAGNOSIS — B20 Human immunodeficiency virus [HIV] disease: Secondary | ICD-10-CM | POA: Diagnosis not present

## 2019-11-12 DIAGNOSIS — Z7689 Persons encountering health services in other specified circumstances: Secondary | ICD-10-CM | POA: Diagnosis not present

## 2019-11-12 LAB — MTB RIF NAA NON-SPUTUM, W/O CULTURE

## 2019-11-12 NOTE — Progress Notes (Signed)
Virtual Visit via Telephone Note Due to national recommendations of social distancing due to COVID 19, telehealth visit is felt to be most appropriate for this patient at this time.  I discussed the limitations, risks, security and privacy concerns of performing an evaluation and management service by telephone and the availability of in person appointments. I also discussed with the patient that there may be a patient responsible charge related to this service. The patient expressed understanding and agreed to proceed.    I connected with LYTLE MALBURG on 11/12/19  at   9:10 AM EST  EDT by telephone and verified that I am speaking with the correct person using two identifiers.   Consent I discussed the limitations, risks, security and privacy concerns of performing an evaluation and management service by telephone and the availability of in person appointments. I also discussed with the patient that there may be a patient responsible charge related to this service. The patient expressed understanding and agreed to proceed.   Location of Patient: Private Residence   Location of Provider: [facility]    Persons participating in Telemedicine visit: Bertram Denver FNP-BC YY [place] CMA [name]  Mother: Johnathin Vanderschaaf   History of Present Illness: Telemedicine visit for: Establish Care  has a past medical history of Anemia, Anxiety, Depression, GERD (gastroesophageal reflux disease), HIV infection (HCC), and Substance abuse (HCC).   Currently being followed by Dr. [name] for his HIV. He had been lost to follow up for several years prior to this year.   ID NOTE 10-14-2019 He was admitted to the hospital in January with profound encephalopathy. 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  He was also broadly treated for other possible CNS pathology including CMV encephalitis with ganciclovir RI PE for possible tuberculosis with  steroids.  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 BP Readings from Last 3 Encounters:  10/14/19 106/71  10/08/19 100/65  10/06/19 103/77    Past Medical History:  Diagnosis Date  . Anemia   . Anxiety   . Depression   . GERD (gastroesophageal reflux disease)   . HIV infection (HCC)   . Substance abuse [facility])     Past Surgical History:  Procedure Laterality Date  . IR GASTROSTOMY TUBE MOD SED  09/02/2019  . IR GASTROSTOMY TUBE REMOVAL  10/28/2019  . TOOTH EXTRACTION      Family History  Problem Relation Age of Onset  . Diabetes Neg Hx   . CAD Neg Hx   . Hypertension Neg Hx     Social History   Socioeconomic History  . Marital status: Single    Spouse name: Not on file  . Number of children: Not on file  . Years of education: Not on file  . Highest education level: Not on file  Occupational History  . Not on file  Tobacco Use  . Smoking status: Current Every Day Smoker    Packs/day: 0.40    Years: 17.00    Pack years: 6.80  . Smokeless tobacco: Never Used  Substance and Sexual Activity  . Alcohol use: No    Comment: occassionally  . Drug use: Not Currently    Types: Marijuana  . Sexual activity: Not Currently    Partners: Male    Comment: pt. declined condoms  Other Topics Concern  . Not on file  Social History Narrative  . Not  on file   Social Determinants of Health   Financial Resource Strain:   . Difficulty of Paying Living Expenses: Not on file  Food Insecurity:   . Worried About Charity fundraiser in the Last Year: Not on file  . Ran Out of Food in the Last Year: Not on file  Transportation Needs:   . Lack of Transportation (Medical): Not on file  . Lack of Transportation (Non-Medical): Not on file  Physical Activity:   . Days of Exercise per Week: Not on file  . Minutes of Exercise per Session: Not on file  Stress:   . Feeling of Stress : Not on file  Social  Connections:   . Frequency of Communication with Friends and Family: Not on file  . Frequency of Social Gatherings with Friends and Family: Not on file  . Attends Religious Services: Not on file  . Active Member of Clubs or Organizations: Not on file  . Attends Archivist Meetings: Not on file  . Marital Status: Not on file     Observations/Objective: Awake, alert and oriented x 3   Review of Systems  Constitutional: Negative for fever, malaise/fatigue and weight loss.  HENT: Negative.  Negative for nosebleeds.   Eyes: Positive for blurred vision (reports complete vision loss in right eye). Negative for double vision and photophobia.  Respiratory: Negative.  Negative for cough and shortness of breath.   Cardiovascular: Negative.  Negative for chest pain, palpitations and leg swelling.  Gastrointestinal: Negative.  Negative for heartburn, nausea and vomiting.  Musculoskeletal: Negative.  Negative for myalgias.  Neurological: Negative.  Negative for dizziness, focal weakness, seizures and headaches.  Psychiatric/Behavioral: Negative.  Negative for suicidal ideas.    Assessment and Plan: Sailor was seen today for establish care.  Diagnoses and all orders for this visit:  Encounter to establish care  HIV DISEASE Follow up with Dr. Tommy Medal  Complete loss of vision -     Ambulatory referral to Ophthalmology     Follow Up Instructions Return in about 3 months (around 02/09/2020).     I discussed the assessment and treatment plan with the patient. The patient was provided an opportunity to ask questions and all were answered. The patient agreed with the plan and demonstrated an understanding of the instructions.   The patient was advised to call back or seek an in-person evaluation if the symptoms worsen or if the condition fails to improve as anticipated.  I provided 19 minutes of non-face-to-face time during this encounter including median intraservice time,  reviewing previous notes, labs, imaging, medications and explaining diagnosis and management.  Gildardo Pounds, FNP-BC

## 2019-11-14 ENCOUNTER — Ambulatory Visit: Payer: Self-pay

## 2019-11-18 ENCOUNTER — Other Ambulatory Visit: Payer: Self-pay | Admitting: Nurse Practitioner

## 2019-11-18 DIAGNOSIS — E785 Hyperlipidemia, unspecified: Secondary | ICD-10-CM

## 2019-11-18 DIAGNOSIS — D62 Acute posthemorrhagic anemia: Secondary | ICD-10-CM

## 2019-11-18 DIAGNOSIS — E871 Hypo-osmolality and hyponatremia: Secondary | ICD-10-CM

## 2019-11-19 ENCOUNTER — Other Ambulatory Visit: Payer: Self-pay

## 2019-11-19 ENCOUNTER — Ambulatory Visit: Payer: Self-pay | Attending: Nurse Practitioner

## 2019-11-19 DIAGNOSIS — E871 Hypo-osmolality and hyponatremia: Secondary | ICD-10-CM | POA: Diagnosis not present

## 2019-11-19 DIAGNOSIS — E785 Hyperlipidemia, unspecified: Secondary | ICD-10-CM

## 2019-11-19 DIAGNOSIS — D62 Acute posthemorrhagic anemia: Secondary | ICD-10-CM

## 2019-11-20 LAB — CMP14+EGFR
ALT: 13 IU/L (ref 0–44)
AST: 17 IU/L (ref 0–40)
Albumin/Globulin Ratio: 0.8 — ABNORMAL LOW (ref 1.2–2.2)
Albumin: 3.8 g/dL — ABNORMAL LOW (ref 4.0–5.0)
Alkaline Phosphatase: 75 IU/L (ref 39–117)
BUN/Creatinine Ratio: 6 — ABNORMAL LOW (ref 9–20)
BUN: 6 mg/dL (ref 6–20)
Bilirubin Total: 0.2 mg/dL (ref 0.0–1.2)
CO2: 22 mmol/L (ref 20–29)
Calcium: 9.5 mg/dL (ref 8.7–10.2)
Chloride: 103 mmol/L (ref 96–106)
Creatinine, Ser: 0.97 mg/dL (ref 0.76–1.27)
GFR calc Af Amer: 116 mL/min/{1.73_m2} (ref >59)
GFR calc non Af Amer: 100 mL/min/{1.73_m2} (ref >59)
Globulin, Total: 4.7 g/dL — ABNORMAL HIGH (ref 1.5–4.5)
Glucose: 91 mg/dL (ref 65–99)
Potassium: 4.2 mmol/L (ref 3.5–5.2)
Sodium: 138 mmol/L (ref 134–144)
Total Protein: 8.5 g/dL (ref 6.0–8.5)

## 2019-11-20 LAB — CBC
Hematocrit: 37.4 % — ABNORMAL LOW (ref 37.5–51.0)
Hemoglobin: 12.6 g/dL — ABNORMAL LOW (ref 13.0–17.7)
MCH: 32.6 pg (ref 26.6–33.0)
MCHC: 33.7 g/dL (ref 31.5–35.7)
MCV: 97 fL (ref 79–97)
NRBC: 1 % — ABNORMAL HIGH (ref 0–0)
Platelets: 319 10*3/uL (ref 150–450)
RBC: 3.86 x10E6/uL — ABNORMAL LOW (ref 4.14–5.80)
RDW: 13.9 % (ref 11.6–15.4)
WBC: 3.7 10*3/uL (ref 3.4–10.8)

## 2019-11-20 LAB — LIPID PANEL
Chol/HDL Ratio: 4.5 ratio (ref 0.0–5.0)
Cholesterol, Total: 241 mg/dL — ABNORMAL HIGH (ref 100–199)
HDL: 54 mg/dL (ref >39)
LDL Chol Calc (NIH): 160 mg/dL — ABNORMAL HIGH (ref 0–99)
Triglycerides: 151 mg/dL — ABNORMAL HIGH (ref 0–149)
VLDL Cholesterol Cal: 27 mg/dL (ref 5–40)

## 2019-11-21 ENCOUNTER — Encounter: Payer: Self-pay | Admitting: Nurse Practitioner

## 2019-11-21 MED ORDER — ATORVASTATIN CALCIUM 20 MG PO TABS: 20.0000 mg | ORAL_TABLET | Freq: Every day | ORAL | 3 refills | Status: DC

## 2019-11-22 ENCOUNTER — Telehealth: Payer: Self-pay | Admitting: Nurse Practitioner

## 2019-11-22 ENCOUNTER — Other Ambulatory Visit: Payer: Self-pay | Admitting: Nurse Practitioner

## 2019-11-22 MED ORDER — MECLIZINE HCL 25 MG PO TABS: 25.0000 mg | ORAL_TABLET | Freq: Every day | ORAL | 2 refills | Status: AC

## 2019-11-22 NOTE — Telephone Encounter (Signed)
[name](mother) is stating her son the patient would like replace meclizine (ANTIVERT) 25 MG tablet [[id number]]   [name] also has questions on side effects. Please call her.

## 2019-11-22 NOTE — Telephone Encounter (Signed)
Spoke to patient's mother who is on [place].  Pt's mother is not asking for a replacement but requesting Reill for the Meclizine.   Will route to PCP for refill.

## 2019-11-26 NOTE — Telephone Encounter (Signed)
[name] M(mother) called states patient was not informed he needed to be fasting and is concerned the Lab testes were not accurate as he ate before the lab tests.  [name] would like to know if he still needs to pick up medication at [facility]. Please call her.

## 2019-12-05 ENCOUNTER — Other Ambulatory Visit: Payer: Self-pay

## 2019-12-05 ENCOUNTER — Ambulatory Visit: Payer: Self-pay

## 2019-12-05 DIAGNOSIS — F4323 Adjustment disorder with mixed anxiety and depressed mood: Secondary | ICD-10-CM | POA: Diagnosis not present

## 2019-12-19 ENCOUNTER — Ambulatory Visit: Payer: Self-pay

## 2020-01-09 ENCOUNTER — Telehealth: Payer: Self-pay | Admitting: *Deleted

## 2020-01-09 NOTE — Telephone Encounter (Signed)
We can check it again but it was actually 160 when we checked it in February of 2021

## 2020-01-09 NOTE — Telephone Encounter (Signed)
Patient's mother Arthur [name] calling to confirm appointment 4/19 with Dr [name]. She would also like for [name]'s cholesterol to be rechecked, stating his PCP wanted him to start cholesterol medication because of his recent labs, but that he did not fast before that lab was colleted.  Arthur [name] would like an accurate lab before [name] starts cholesterol medication. She states he can fast until his appointment on the 19th. Andree Coss, RN

## 2020-01-13 ENCOUNTER — Encounter: Payer: Self-pay | Admitting: Infectious Disease

## 2020-01-13 ENCOUNTER — Ambulatory Visit (INDEPENDENT_AMBULATORY_CARE_PROVIDER_SITE_OTHER): Payer: Medicaid Other | Admitting: Infectious Disease

## 2020-01-13 ENCOUNTER — Other Ambulatory Visit: Payer: Self-pay

## 2020-01-13 VITALS — BP 132/86 | HR 85 | Temp 97.8°F | Ht 71.0 in | Wt 182.0 lb

## 2020-01-13 DIAGNOSIS — R634 Abnormal weight loss: Secondary | ICD-10-CM | POA: Diagnosis not present

## 2020-01-13 DIAGNOSIS — F0280 Dementia in other diseases classified elsewhere without behavioral disturbance: Secondary | ICD-10-CM | POA: Diagnosis not present

## 2020-01-13 DIAGNOSIS — Z931 Gastrostomy status: Secondary | ICD-10-CM | POA: Diagnosis not present

## 2020-01-13 DIAGNOSIS — A523 Neurosyphilis, unspecified: Secondary | ICD-10-CM

## 2020-01-13 DIAGNOSIS — B582 Toxoplasma meningoencephalitis: Secondary | ICD-10-CM

## 2020-01-13 DIAGNOSIS — B20 Human immunodeficiency virus [HIV] disease: Secondary | ICD-10-CM

## 2020-01-13 NOTE — Patient Instructions (Signed)
Get COVID vaccines  #1 and #2  Labs today  RTC in 3 months and BRING MEDS TO CLNIC

## 2020-01-13 NOTE — Progress Notes (Signed)
Chief complaint follow-up for HIV disease and CNS toxoplasmosis  Subjective:    Patient ID: Arthur [name], male    DOB: 04-26-1981, 39 y.o.   MRN: [id number]  HPI   Arthur [name] is a 39 year old African-American man living with HIV who unfortunately fell out of care and was not seen by me since 2015.  He previously failed Atripla with resistance and IK 103 when he is being seen by Dr. Philipp Deputy.  I change him to a protease-based regimen with Prezista Norvir and Truvada but he had not been seen since 2015.  He was admitted to the hospital with profound encephalopathy.  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  He was also broadly treated for other possible CNS pathology including CMV encephalitis with ganciclovir RI PE for possible tuberculosis with steroids.  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  He was ultimately discharged from the inpatient hospital service to inpatient rehab and completed therapy while there.  Profound dysphagia and nutritional deficiency he was given a PEG tube through which she was getting his medications along with nutrition.  He is living at home with his mother and also his sister who is a CMA and have been overseeing his medications.  We did have some issues with ensuring continuity of his antitoxoplasma therapy upon discharge in the hospital and follow-up in our clinic but now he has been on that medication phonation consistently.  He has renewed his HMA P program as well.  Neurological exam is improved has been able to walk he is now still using a walker he is put on quite a bit of weight now on 2 is in the 180s.  He is accompanied by his sister today but did not bring his medications with him  Past Medical History:  Diagnosis Date  . Anemia   . Anxiety   . Depression   . GERD (gastroesophageal reflux disease)   . HIV infection (HCC)   . Substance abuse  [facility])     Past Surgical History:  Procedure Laterality Date  . IR GASTROSTOMY TUBE MOD SED  09/02/2019  . IR GASTROSTOMY TUBE REMOVAL  10/28/2019  . TOOTH EXTRACTION      Family History  Problem Relation Age of Onset  . Diabetes Neg Hx   . CAD Neg Hx   . Hypertension Neg Hx       Social History   Socioeconomic History  . Marital status: Single    Spouse name: Not on file  . Number of children: Not on file  . Years of education: Not on file  . Highest education level: Not on file  Occupational History  . Not on file  Tobacco Use  . Smoking status: Current Every Day Smoker    Packs/day: 0.20    Years: 17.00    Pack years: 3.40  . Smokeless tobacco: Never Used  . Tobacco comment: encouraged to quit  Substance and Sexual Activity  . Alcohol use: No    Comment: occassionally  . Drug use: Not Currently    Types: Marijuana  . Sexual activity: Not Currently    Partners: Male    Comment: pt. declined condoms  Other Topics Concern  . Not on file  Social History Narrative  . Not on file   Social Determinants of Health   Financial Resource Strain:   . Difficulty of Paying Living Expenses:   Food Insecurity:   .  Worried About Programme researcher, broadcasting/film/video in the Last Year:   . Barista in the Last Year:   Transportation Needs:   . Freight forwarder (Medical):   [name] Lack of Transportation (Non-Medical):   Physical Activity:   . Days of Exercise per Week:   . Minutes of Exercise per Session:   Stress:   . Feeling of Stress :   Social Connections:   . Frequency of Communication with Friends and Family:   . Frequency of Social Gatherings with Friends and Family:   . Attends Religious Services:   . Active Member of Clubs or Organizations:   . Attends Banker Meetings:   [name] Marital Status:     No Known Allergies   Current Outpatient Medications:  .  acetaminophen (TYLENOL) 500 MG tablet, Take 500 mg by mouth every 6 (six) hours as needed., Disp: ,  Rfl:  .  atorvastatin (LIPITOR) 20 MG tablet, Take 1 tablet (20 mg total) by mouth daily., Disp: 90 tablet, Rfl: 3 .  dolutegravir (TIVICAY) 50 MG tablet, Take 1 tablet (50 mg total) by mouth daily., Disp: 30 tablet, Rfl: 2 .  emtricitabine-tenofovir AF (DESCOVY) 200-25 MG tablet, Take 1 tablet by mouth daily., Disp: 30 tablet, Rfl: 2 .  leucovorin (WELLCOVORIN) 25 MG tablet, Take 1 tablet (25 mg total) by mouth daily., Disp: 30 tablet, Rfl: 5 .  Menthol-Methyl Salicylate (MUSCLE RUB) 10-15 % CREA, Apply 1 application topically as needed for muscle pain., Disp:  , Rfl: 0 .  pyrimethamine (DARAPRIM) 25 MG tablet, Take 2 tablets (50 mg total) by mouth daily with breakfast., Disp: 60 tablet, Rfl: 5 .  sulfaDIAZINE 500 MG tablet, Take 2 tablets (1,000 mg total) by mouth 2 (two) times daily., Disp: 120 tablet, Rfl: 5 .  sulfamethoxazole-trimethoprim (BACTRIM DS) 800-160 MG tablet, Take 1 tablet by mouth 3 (three) times a week., Disp: 12 tablet, Rfl: 11 .  terbinafine (LAMISIL) 1 % cream, Apply topically daily. Apply to feet daily, Disp: 30 g, Rfl: 0 .  famotidine (PEPCID) 20 MG tablet, Take 1 tablet (20 mg total) by mouth at bedtime. (Patient not taking: Reported on 11/12/2019), Disp: 30 tablet, Rfl: 11 .  midodrine (PROAMATINE) 5 MG tablet, Take 1 tablet (5 mg total) by mouth 3 (three) times daily with meals. (Patient not taking: Reported on 11/12/2019), Disp: 90 tablet, Rfl: 0 .  ondansetron (ZOFRAN) 4 MG tablet, Take 1 tablet (4 mg total) by mouth every 6 (six) hours as needed for nausea., Disp: 20 tablet, Rfl: 0 .  SUMAtriptan (IMITREX) 50 MG tablet, Take 1 tablet (50 mg total) by mouth every 2 (two) hours as needed for migraine. May repeat in 2 hours if headache persists or recurs. (Patient not taking: Reported on 11/12/2019), Disp: 10 tablet, Rfl: 0   Review of Systems  Unable to perform ROS: Mental status change       Objective:   Physical Exam Constitutional:      Appearance: He is cachectic.   HENT:     Head: Normocephalic.     Nose: Nose normal.  Eyes:     Extraocular Movements: Extraocular movements intact.  Cardiovascular:     Rate and Rhythm: Normal rate and regular rhythm.  Pulmonary:     Effort: Pulmonary effort is normal. No respiratory distress.  Neurological:     Mental Status: He is alert.  Psychiatric:        Attention and Perception: Attention normal.  Mood and Affect: Mood normal.        Speech: Speech is delayed.        Behavior: Behavior is cooperative.        Cognition and Memory: Cognition is impaired.           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  Hopefully he can recover as much as possible.

## 2020-01-14 LAB — URINE CYTOLOGY ANCILLARY ONLY
Chlamydia: NEGATIVE
Comment: NEGATIVE
Comment: NORMAL
Neisseria Gonorrhea: NEGATIVE

## 2020-01-14 LAB — T-HELPER CELL (CD4) - (RCID CLINIC ONLY)
CD4 % Helper T Cell: 5 % — ABNORMAL LOW (ref 33–65)
CD4 T Cell Abs: 50 /uL — ABNORMAL LOW (ref 400–1790)

## 2020-01-15 ENCOUNTER — Telehealth: Payer: Self-pay | Admitting: Infectious Disease

## 2020-01-15 DIAGNOSIS — A523 Neurosyphilis, unspecified: Secondary | ICD-10-CM

## 2020-01-15 NOTE — Telephone Encounter (Signed)
Pt w hx of NSyphlis and RPR up again  Needs LP

## 2020-01-16 ENCOUNTER — Telehealth: Payer: Self-pay

## 2020-01-16 LAB — CBC WITH DIFFERENTIAL/PLATELET
Absolute Monocytes: 387 cells/uL (ref 200–950)
Basophils Absolute: 9 cells/uL (ref 0–200)
Basophils Relative: 0.3 %
Eosinophils Absolute: 159 cells/uL (ref 15–500)
Eosinophils Relative: 5.3 %
HCT: 42.5 % (ref 38.5–50.0)
Hemoglobin: 14.3 g/dL (ref 13.2–17.1)
Lymphs Abs: 1020 cells/uL (ref 850–3900)
MCH: 31.9 pg (ref 27.0–33.0)
MCHC: 33.6 g/dL (ref 32.0–36.0)
MCV: 94.9 fL (ref 80.0–100.0)
MPV: 9.2 fL (ref 7.5–12.5)
Monocytes Relative: 12.9 %
Neutro Abs: 1425 cells/uL — ABNORMAL LOW (ref 1500–7800)
Neutrophils Relative %: 47.5 %
Platelets: 321 10*3/uL (ref 140–400)
RBC: 4.48 10*6/uL (ref 4.20–5.80)
RDW: 13.5 % (ref 11.0–15.0)
Total Lymphocyte: 34 %
WBC: 3 10*3/uL — ABNORMAL LOW (ref 3.8–10.8)

## 2020-01-16 LAB — COMPLETE METABOLIC PANEL WITH GFR
AG Ratio: 0.9 (calc) — ABNORMAL LOW (ref 1.0–2.5)
ALT: 16 U/L (ref 9–46)
AST: 18 U/L (ref 10–40)
Albumin: 4.3 g/dL (ref 3.6–5.1)
Alkaline phosphatase (APISO): 65 U/L (ref 36–130)
BUN/Creatinine Ratio: 6 (calc) (ref 6–22)
BUN: 6 mg/dL — ABNORMAL LOW (ref 7–25)
CO2: 24 mmol/L (ref 20–32)
Calcium: 9.5 mg/dL (ref 8.6–10.3)
Chloride: 105 mmol/L (ref 98–110)
Creat: 1.06 mg/dL (ref 0.60–1.35)
GFR, Est African American: 104 mL/min/{1.73_m2} (ref >=60)
GFR, Est Non African American: 90 mL/min/{1.73_m2} (ref >=60)
Globulin: 4.6 g/dL (calc) — ABNORMAL HIGH (ref 1.9–3.7)
Glucose, Bld: 83 mg/dL (ref 65–99)
Potassium: 3.8 mmol/L (ref 3.5–5.3)
Sodium: 138 mmol/L (ref 135–146)
Total Bilirubin: 0.1 mg/dL — ABNORMAL LOW (ref 0.2–1.2)
Total Protein: 8.9 g/dL — ABNORMAL HIGH (ref 6.1–8.1)

## 2020-01-16 LAB — RPR: RPR Ser Ql: REACTIVE — AB

## 2020-01-16 LAB — HIV-1 RNA QUANT-NO REFLEX-BLD
HIV 1 RNA Quant: <20 copies/mL
HIV-1 RNA Quant, Log: <1.3 Log copies/mL

## 2020-01-16 LAB — RPR TITER: RPR Titer: 1:128 {titer} — ABNORMAL HIGH

## 2020-01-16 LAB — FLUORESCENT TREPONEMAL AB(FTA)-IGG-BLD: Fluorescent Treponemal ABS: REACTIVE — AB

## 2020-01-16 NOTE — Telephone Encounter (Signed)
Per Morrie Sheldon at [facility], patient should be directed to [facility] for LP as he is not allergic to contrast. Left VM with [facility] to schedule appointment.  Kazia Grisanti Loyola Mast, RN

## 2020-01-16 NOTE — Telephone Encounter (Signed)
Contacted patient's mother Kathi Der about test results and Dr. Zenaida Niece Dam's recommendations. Informed her that [facility] will be calling this afternoon to schedule a lumbar puncture for next week. All questions and concerns addressed.   Juliah Scadden Loyola Mast, RN

## 2020-01-16 NOTE — Telephone Encounter (Signed)
Awaiting call back from IR for LP scheduling.   Edwardo Wojnarowski Loyola Mast, RN

## 2020-01-22 ENCOUNTER — Other Ambulatory Visit: Payer: Self-pay | Admitting: Infectious Disease

## 2020-01-23 ENCOUNTER — Ambulatory Visit
Admission: RE | Admit: 2020-01-23 | Discharge: 2020-01-23 | Disposition: A | Discharge status: Home / Self Care | Payer: Self-pay | Source: Ambulatory Visit | Attending: Infectious Disease | Admitting: Infectious Disease

## 2020-01-23 ENCOUNTER — Other Ambulatory Visit: Payer: Self-pay

## 2020-01-23 VITALS — BP 105/60 | HR 79

## 2020-01-23 DIAGNOSIS — B20 Human immunodeficiency virus [HIV] disease: Secondary | ICD-10-CM | POA: Diagnosis not present

## 2020-01-23 DIAGNOSIS — A523 Neurosyphilis, unspecified: Secondary | ICD-10-CM | POA: Diagnosis not present

## 2020-01-23 IMAGING — XA DG FLUORO GUIDE SPINAL/SI JT INJ*L*
1 series · 1 of 1 positions shown · non-contrast
Comparison: none

CLINICAL DATA: History of HIV and neurosyphilis.

[Series 1: ortho standard · 1 of 1 slices shown]
[im 1/1]
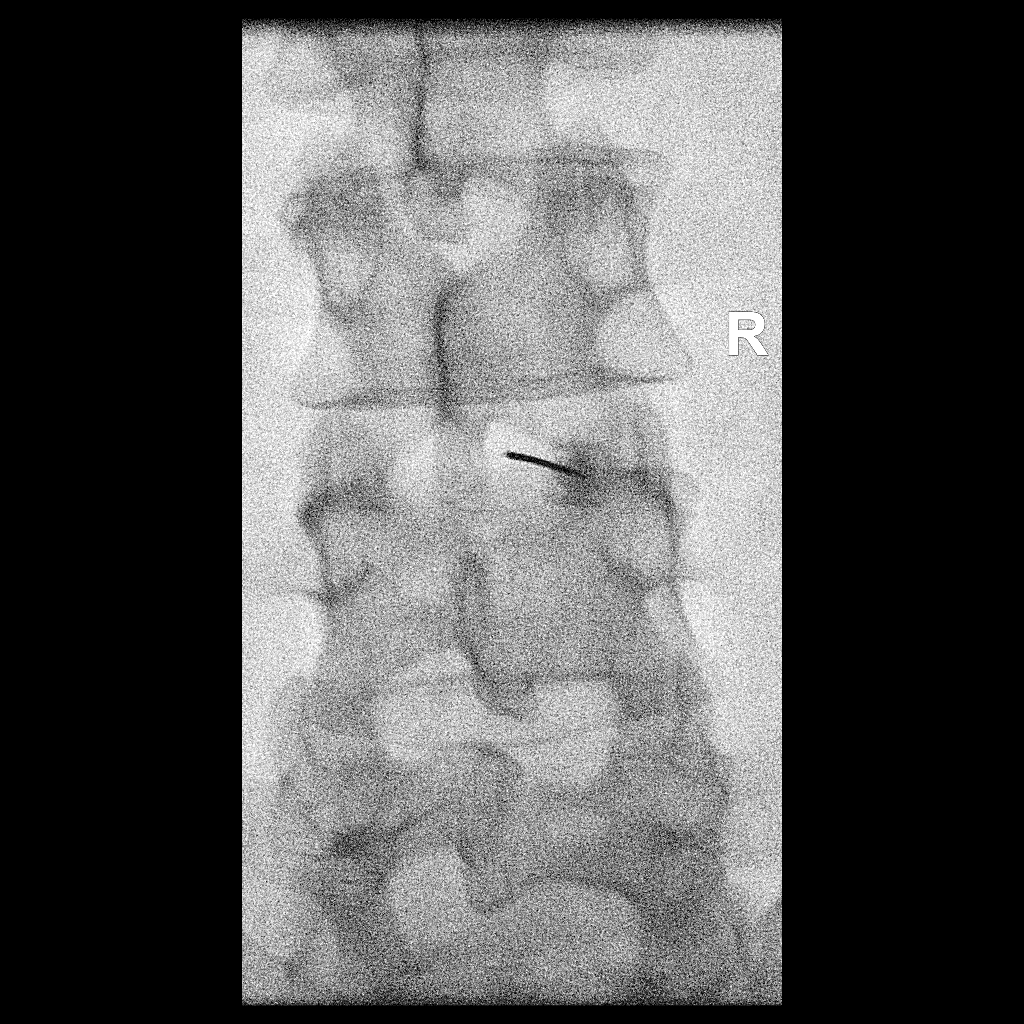

[1 of 1 positions shown; findings below may reference images not displayed]

EXAM:
DIAGNOSTIC LUMBAR PUNCTURE UNDER FLUOROSCOPIC GUIDANCE

FLUOROSCOPY TIME:  Fluoroscopy Time:  8 seconds

Radiation Exposure Index (if provided by the fluoroscopic device):
0.6 mGy

Number of Acquired Spot Images: 0

PROCEDURE:
Informed consent was obtained from the patient prior to the
procedure, including potential complications of headache, allergy,
and pain. With the patient prone, the lower back was prepped with
Betadine. 1% Lidocaine was used for local anesthesia. Lumbar
puncture was performed at the L3-L4 level via a right interlaminar
approach using a 3.5 inch 20 gauge needle with return of clear,
colorless CSF with an opening pressure of 15 cm water. 10 ml of CSF
were obtained for laboratory studies. The patient tolerated the
procedure well and there were no apparent complications.
IMPRESSION: Technically successful fluoroscopically guided lumbar puncture.

## 2020-01-23 NOTE — Discharge Instructions (Signed)

## 2020-01-26 ENCOUNTER — Other Ambulatory Visit: Payer: Self-pay | Admitting: Infectious Disease

## 2020-01-26 DIAGNOSIS — A523 Neurosyphilis, unspecified: Secondary | ICD-10-CM

## 2020-01-26 LAB — CSF CELL COUNT WITH DIFFERENTIAL
Basophils, %: 0 %
Eosinophils, CSF: 0 %
Lymphs, CSF: 98 % — ABNORMAL HIGH (ref 40–80)
Monocyte/Macrophage: 2 % — ABNORMAL LOW (ref 15–45)
RBC Count, CSF: 1 cells/uL — ABNORMAL HIGH
Segmented Neutrophils-CSF: 0 % (ref 0–6)
WBC, CSF: 16 cells/uL — ABNORMAL HIGH (ref 0–5)

## 2020-01-26 LAB — PROTEIN, CSF: Total Protein, CSF: 143 mg/dL — ABNORMAL HIGH (ref 15–45)

## 2020-01-26 LAB — VDRL, CSF: VDRL Quant, CSF: 1:4 {titer} — AB

## 2020-01-26 LAB — GLUCOSE, CSF: Glucose, CSF: 47 mg/dL (ref 40–80)

## 2020-01-28 ENCOUNTER — Telehealth: Payer: Self-pay | Admitting: *Deleted

## 2020-01-28 NOTE — Telephone Encounter (Signed)
RN called patient's mother, [name], to relay results and plan.   Dr [name] placed PICC order.  Patient now has medicaid, retroactive to 07/28/2019.  Will need written orders to set up PICC appointment, home health nursing, labs. Please advise when the IM 2.4 million units of bicillin should be in relation to IV penicillin. Andree Coss, RN

## 2020-01-28 NOTE — Telephone Encounter (Signed)
-----   Message from Randall Hiss, MD sent at 01/26/2020  6:10 PM EDT ----- Patient needs to have PICC line placed and repeat 2 week course of IV penicillin followed by one IM 2.4 MU PCN shot in gluteus

## 2020-01-31 NOTE — Telephone Encounter (Signed)
Mother aware of PICC appointment, plan for home health through [facility] pharmacy. Called [facility] to let them know of PICC placement date 5/11, 200.  Patient does not need first dose at [facility], as he has had IV penicillin therapy previously without issue Patient will need to schedule nurse visit for bicillin injection the day of or after he completes IV penicillin - this appointment will depend on start date. Andree Coss, RN

## 2020-01-31 NOTE — Telephone Encounter (Signed)
Great! Thanks! It will be ready in triage.

## 2020-01-31 NOTE — Telephone Encounter (Signed)
Marcelino Duster I can come in and sign all of this stuff today. Is someone in triage I can give to. I would want to give the 2.4 MU after the last dose of IV PCN

## 2020-02-01 ENCOUNTER — Telehealth: Payer: Self-pay | Admitting: Internal Medicine

## 2020-02-01 NOTE — Telephone Encounter (Signed)
I received a telephone call today from Mr. Clock mother, Ms. Christell Constant, who told me that 2 days ago Mr. Hedman began to have nausea and vomiting.  He has not had any fever or diarrhea.  His PCP prescribed ondansetron and that has allowed him to keep down liquids.  He has been able to take his medications and keep them down.  He has been very tired and sleepy.  He has a virtual visit with his PCP on Monday, 02/03/2020.  I asked her to call back if he is not improving over the next 3 to 4 days.

## 2020-02-03 ENCOUNTER — Other Ambulatory Visit: Payer: Self-pay | Admitting: Radiology

## 2020-02-03 ENCOUNTER — Other Ambulatory Visit: Payer: Self-pay

## 2020-02-03 ENCOUNTER — Ambulatory Visit: Payer: Self-pay | Attending: Nurse Practitioner | Admitting: Nurse Practitioner

## 2020-02-04 ENCOUNTER — Ambulatory Visit (HOSPITAL_COMMUNITY)
Admission: RE | Admit: 2020-02-04 | Discharge: 2020-02-04 | Disposition: A | Discharge status: Home / Self Care | Payer: Medicaid Other | Source: Ambulatory Visit | Attending: Infectious Disease | Admitting: Infectious Disease

## 2020-02-04 ENCOUNTER — Other Ambulatory Visit: Payer: Self-pay | Admitting: Radiology

## 2020-02-04 ENCOUNTER — Other Ambulatory Visit: Payer: Self-pay | Admitting: Infectious Disease

## 2020-02-04 ENCOUNTER — Other Ambulatory Visit: Payer: Self-pay

## 2020-02-04 DIAGNOSIS — A523 Neurosyphilis, unspecified: Secondary | ICD-10-CM | POA: Insufficient documentation

## 2020-02-04 IMAGING — XA IR PICC >5YO
2 series · 5 of 5 positions shown · IV contrast (agent unspecified)
Comparison: none

INDICATION: Patient presents for PICC line placement for ongoing outpatient
antibiotic access

EXAM:
ULTRASOUND AND FLUOROSCOPIC GUIDED PICC LINE INSERTION
MEDICATIONS:
Lidocaine 1% 2 mL
CONTRAST:  None
FLUOROSCOPY TIME:  Thirty-six seconds (1 mGy)
COMPLICATIONS:
None immediate.
TECHNIQUE: The procedure, risks, benefits, and alternatives were explained to
the patient and informed written consent was obtained.

[Series 1: fl (-) angio · 1 of 1 slices shown]
[im 1/1]
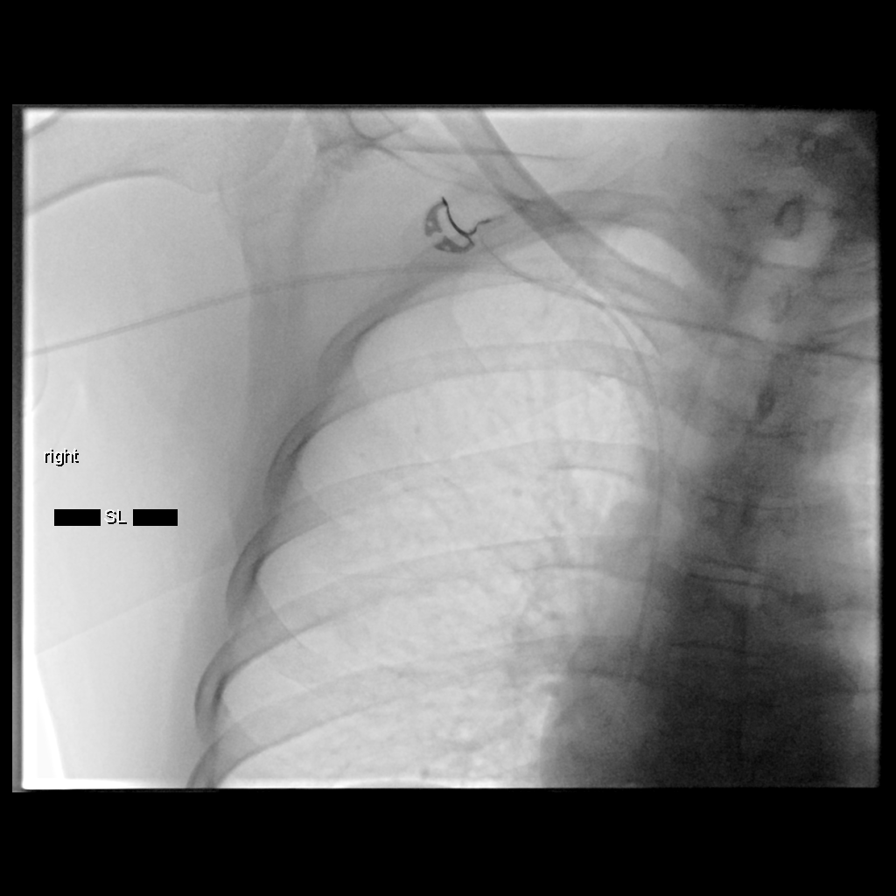

[Series 1: ir picc >5yo · 4 of 4 slices shown]
[im 1/4]
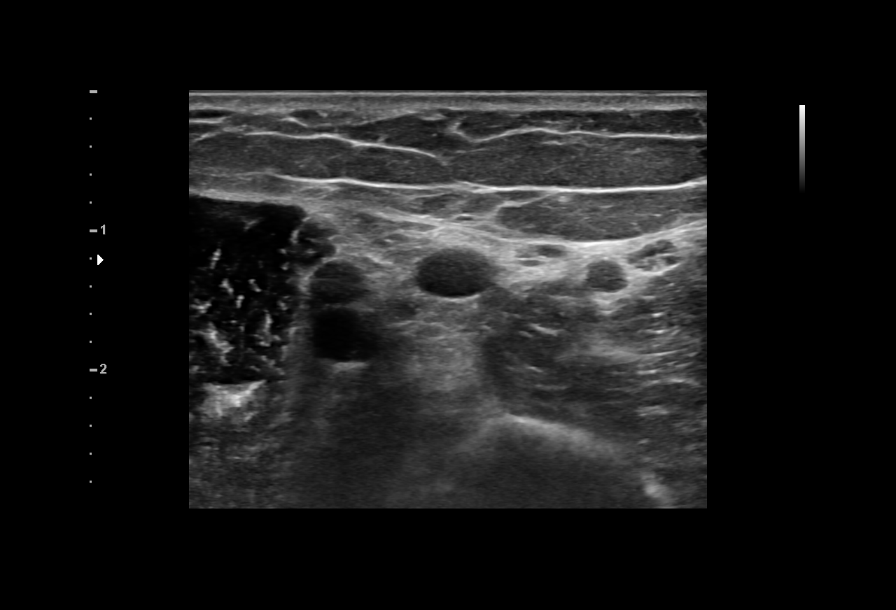
[im 2/4]
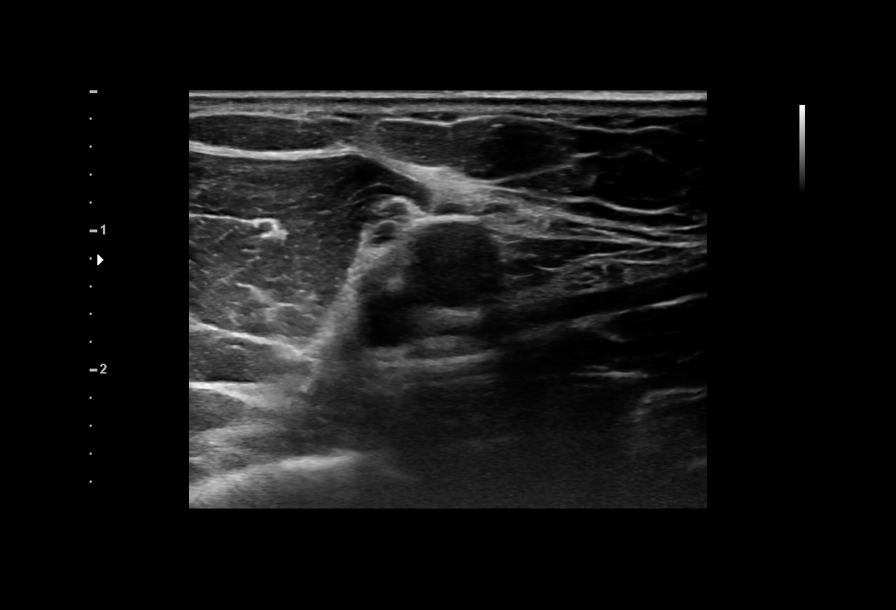
[im 3/4]
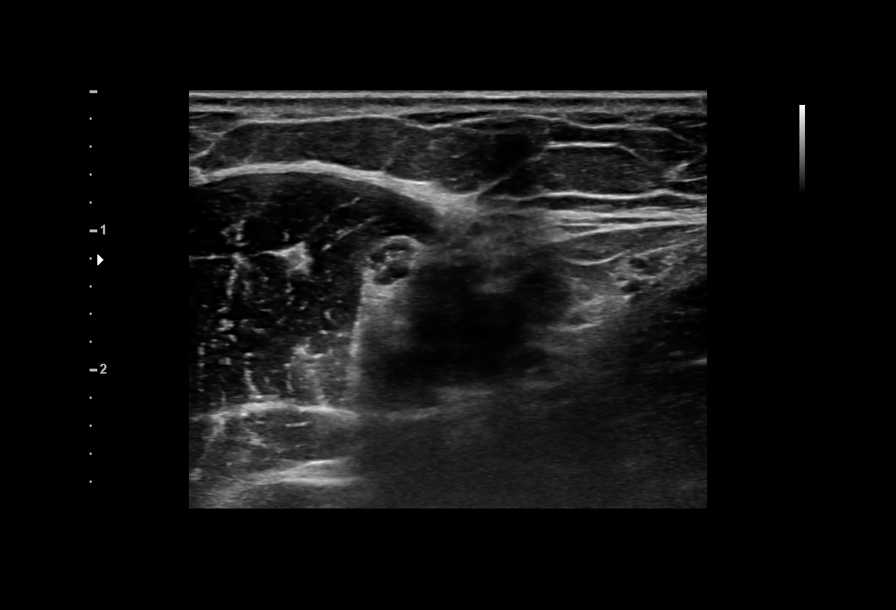
[im 4/4]
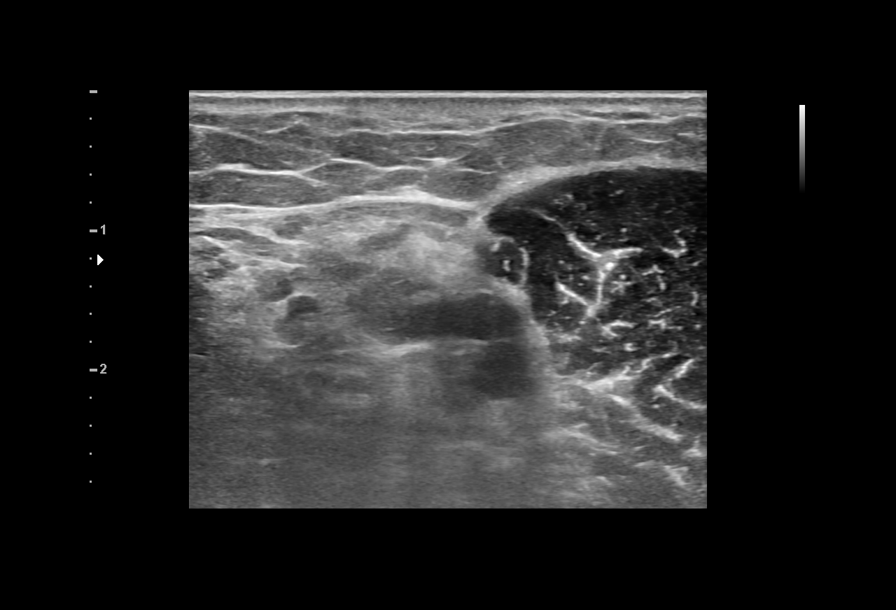

[5 of 5 positions shown; findings below may reference images not displayed]

The right upper extremity was prepped with chlorhexidine in a
sterile fashion, and a sterile drape was applied covering the
operative field. Maximum barrier sterile technique with sterile
gowns and gloves were used for the procedure. A timeout was
performed prior to the initiation of the procedure. Local anesthesia
was provided with 1% lidocaine.

After the overlying soft tissues were anesthetized and a
micropuncture kit was utilized to access the right basilic vein.
Real-time ultrasound guidance was utilized for vascular access
including the acquisition of a permanent ultrasound image
documenting patency of the accessed vessel.

A guidewire was advanced to the level of the superior caval-atrial
junction for measurement purposes and the PICC line was cut to
length. A peel-away sheath was placed and a 39 cm, 5 French, dual
lumen was inserted to level of the superior caval-atrial junction. A
post procedure spot fluoroscopic was obtained. The catheter easily
aspirated and flushed and was secured in place. A dressing was
placed. The patient tolerated the procedure well without immediate
post procedural complication.
FINDINGS: After catheter placement, the tip lies within the superior
cavoatrial junction. The catheter aspirates and flushes normally and
is ready for immediate use.
IMPRESSION: Successful ultrasound and fluoroscopic guided placement of a right
basilic vein approach, 39 cm, 5 French, single lumen PICC with tip
at the superior caval-atrial junction. The PICC line is ready for
immediate use.

## 2020-02-04 MED ORDER — HEPARIN SOD (PORK) LOCK FLUSH 100 UNIT/ML IV SOLN
INTRAVENOUS | Status: AC
  Filled 2020-02-04: qty 5

## 2020-02-04 MED ORDER — LIDOCAINE HCL 1 % IJ SOLN
INTRAMUSCULAR | Status: AC
  Filled 2020-02-04: qty 20

## 2020-02-04 MED ORDER — LIDOCAINE HCL 1 % IJ SOLN
INTRAMUSCULAR | Status: DC | PRN
  Administered 2020-02-04: 10 mL

## 2020-02-04 NOTE — Procedures (Signed)
Basiclic vein single lumen PICC placed without immediate complications. Length 39 cm tip SVC / right atrial junction. Okay to use. Medication used - 1& lidocaine and subcutaneous tissues. EBL < 2 ml.
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
## 2020-02-05 DIAGNOSIS — A523 Neurosyphilis, unspecified: Secondary | ICD-10-CM | POA: Diagnosis not present

## 2020-02-05 DIAGNOSIS — B20 Human immunodeficiency virus [HIV] disease: Secondary | ICD-10-CM | POA: Diagnosis not present

## 2020-02-08 DIAGNOSIS — B20 Human immunodeficiency virus [HIV] disease: Secondary | ICD-10-CM | POA: Diagnosis not present

## 2020-02-08 DIAGNOSIS — A523 Neurosyphilis, unspecified: Secondary | ICD-10-CM | POA: Diagnosis not present

## 2020-02-10 ENCOUNTER — Encounter: Payer: Self-pay | Admitting: Infectious Disease

## 2020-02-10 DIAGNOSIS — A523 Neurosyphilis, unspecified: Secondary | ICD-10-CM | POA: Diagnosis not present

## 2020-02-10 DIAGNOSIS — B582 Toxoplasma meningoencephalitis: Secondary | ICD-10-CM | POA: Diagnosis not present

## 2020-02-11 ENCOUNTER — Ambulatory Visit: Payer: Medicaid Other | Attending: Nurse Practitioner | Admitting: Nurse Practitioner

## 2020-02-11 ENCOUNTER — Other Ambulatory Visit: Payer: Self-pay

## 2020-02-11 ENCOUNTER — Encounter: Payer: Self-pay | Admitting: Nurse Practitioner

## 2020-02-11 ENCOUNTER — Ambulatory Visit: Payer: Self-pay | Admitting: Nurse Practitioner

## 2020-02-11 VITALS — BP 109/75 | HR 84 | Temp 97.9°F | Ht 71.0 in | Wt 183.0 lb

## 2020-02-11 DIAGNOSIS — F329 Major depressive disorder, single episode, unspecified: Secondary | ICD-10-CM

## 2020-02-11 DIAGNOSIS — E785 Hyperlipidemia, unspecified: Secondary | ICD-10-CM | POA: Diagnosis not present

## 2020-02-11 DIAGNOSIS — F419 Anxiety disorder, unspecified: Secondary | ICD-10-CM | POA: Diagnosis not present

## 2020-02-11 DIAGNOSIS — F32A Depression, unspecified: Secondary | ICD-10-CM

## 2020-02-11 NOTE — Progress Notes (Signed)
Assessment & Plan:  Arthur [name] was seen today for follow-up.  Diagnoses and all orders for this visit:  Anxiety and depression -     citalopram (CELEXA) 20 MG tablet; Take 1 tablet (20 mg total) by mouth daily.  Dyslipidemia, goal LDL below 100 -     Lipid panel; Future    Patient has been counseled on age-appropriate routine health concerns for screening and prevention. These are reviewed and up-to-date. Referrals have been placed accordingly. Immunizations are up-to-date or declined.    Subjective:   Chief Complaint  Patient presents with  . Follow-up    Pt. is here for a follow up.    HPI Arthur [name] 39 y.o. male presents to office today for follow up. 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 He has a history of non adherence with his medications and depression .  His mother is on speakerphone today. Arthur [name] is very angry and upset that his sister was not allowed to come back to the exam room. Due to COVID restrictions and the size of the exam room Arthur [name] was the only person allowed to come back. I did let him know he is always welcome to have a family member on Speakerphone during his office visits. He was silent for the majority of the visit today.  Mother states he is doing well. She has no concerns or questions. Arthur [name] will not answer questions for me in the office. He sucks his teeth and rolls his eyes during most of the history taking.  Using a walker.   Dyslipidemia Not taking atorvastatin. Mother states last labs were not fasting and she would like to have them repeated prior to him starting a statin.     Review of Systems  Constitutional: Negative for fever, malaise/fatigue and weight loss.  HENT: Negative.  Negative for nosebleeds.   Eyes: Negative.  Negative for blurred vision, double vision and photophobia.  Respiratory: Negative.  Negative for cough and shortness of breath.   Cardiovascular: Negative.  Negative for  chest pain, palpitations and leg swelling.  Gastrointestinal: Negative.  Negative for heartburn, nausea and vomiting.  Musculoskeletal: Negative.  Negative for myalgias.  Neurological: Negative.  Negative for dizziness, focal weakness, seizures and headaches.  Psychiatric/Behavioral: Positive for depression. Negative for suicidal ideas. The patient is nervous/anxious.     Past Medical History:  Diagnosis Date  . Anemia   . Anxiety   . Depression   . GERD (gastroesophageal reflux disease)   . HIV infection (HCC)   . Substance abuse [facility])     Past Surgical History:  Procedure Laterality Date  . IR GASTROSTOMY TUBE MOD SED  09/02/2019  . IR GASTROSTOMY TUBE REMOVAL  10/28/2019  . TOOTH EXTRACTION      Family History  Problem Relation Age of Onset  . Diabetes Neg Hx   . CAD Neg Hx   . Hypertension Neg Hx     Social History Reviewed with no changes to be made today.   Outpatient Medications Prior to Visit  Medication Sig Dispense Refill  . acetaminophen (TYLENOL) 500 MG tablet Take 500 mg by mouth every 6 (six) hours as needed.    . DESCOVY 200-25 MG tablet TAKE 1 TABLET BY MOUTH DAILY 30 tablet 2  . leucovorin (WELLCOVORIN) 25 MG tablet Take 1 tablet (25 mg total) by mouth daily. 30 tablet 5  . Menthol-Methyl Salicylate (MUSCLE RUB) 10-15 % CREA Apply 1 application topically as  needed for muscle pain.  0  . ondansetron (ZOFRAN) 4 MG tablet Take 1 tablet (4 mg total) by mouth every 6 (six) hours as needed for nausea. 20 tablet 0  . pyrimethamine (DARAPRIM) 25 MG tablet Take 2 tablets (50 mg total) by mouth daily with breakfast. 60 tablet 5  . sulfaDIAZINE 500 MG tablet Take 2 tablets (1,000 mg total) by mouth 2 (two) times daily. 120 tablet 5  . sulfamethoxazole-trimethoprim (BACTRIM DS) 800-160 MG tablet Take 1 tablet by mouth 3 (three) times a week. 12 tablet 11  . terbinafine (LAMISIL) 1 % cream Apply topically daily. Apply to feet daily 30 g 0  . TIVICAY 50 MG tablet TAKE 1  TABLET(50 MG) BY MOUTH DAILY 30 tablet 2  . atorvastatin (LIPITOR) 20 MG tablet Take 1 tablet (20 mg total) by mouth daily. (Patient not taking: Reported on 02/11/2020) 90 tablet 3  . famotidine (PEPCID) 20 MG tablet Take 1 tablet (20 mg total) by mouth at bedtime. (Patient not taking: Reported on 11/12/2019) 30 tablet 11  . SUMAtriptan (IMITREX) 50 MG tablet Take 1 tablet (50 mg total) by mouth every 2 (two) hours as needed for migraine. May repeat in 2 hours if headache persists or recurs. (Patient not taking: Reported on 11/12/2019) 10 tablet 0  . midodrine (PROAMATINE) 5 MG tablet Take 1 tablet (5 mg total) by mouth 3 (three) times daily with meals. (Patient not taking: Reported on 11/12/2019) 90 tablet 0   No facility-administered medications prior to visit.    No Known Allergies     Objective:    BP 109/75 (BP Location: Left Arm, Patient Position: Sitting, Cuff Size: Normal)   Pulse 84   Temp 97.9 F (36.6 C) (Temporal)   Ht 5\' 11"  (1.803 m)   Wt 183 lb (83 kg)   SpO2 97%   BMI 25.52 kg/m  Wt Readings from Last 3 Encounters:  02/11/20 183 lb (83 kg)  01/13/20 182 lb (82.6 kg)  10/14/19 135 lb (61.2 kg)    Physical Exam       Patient has been counseled extensively about nutrition and exercise as well as the importance of adherence with medications and regular follow-up. The patient was given clear instructions to go to ER or return to medical center if symptoms don't improve, worsen or new problems develop. The patient verbalized understanding.   Follow-up: Return if symptoms worsen or fail to improve, for fasting lipid panel.   Gildardo Pounds, FNP-BC [facility], [place]   02/16/2020,

## 2020-02-15 DIAGNOSIS — B20 Human immunodeficiency virus [HIV] disease: Secondary | ICD-10-CM | POA: Diagnosis not present

## 2020-02-15 DIAGNOSIS — A523 Neurosyphilis, unspecified: Secondary | ICD-10-CM | POA: Diagnosis not present

## 2020-02-16 ENCOUNTER — Encounter: Payer: Self-pay | Admitting: Nurse Practitioner

## 2020-02-16 MED ORDER — CITALOPRAM HYDROBROMIDE 20 MG PO TABS: 20.0000 mg | ORAL_TABLET | Freq: Every day | ORAL | 3 refills | Status: DC

## 2020-02-17 ENCOUNTER — Ambulatory Visit: Payer: Medicaid Other | Attending: Nurse Practitioner

## 2020-02-17 ENCOUNTER — Other Ambulatory Visit: Payer: Self-pay

## 2020-02-17 ENCOUNTER — Encounter: Payer: Self-pay | Admitting: Infectious Disease

## 2020-02-17 DIAGNOSIS — B20 Human immunodeficiency virus [HIV] disease: Secondary | ICD-10-CM

## 2020-02-17 DIAGNOSIS — A523 Neurosyphilis, unspecified: Secondary | ICD-10-CM | POA: Diagnosis not present

## 2020-02-17 DIAGNOSIS — B582 Toxoplasma meningoencephalitis: Secondary | ICD-10-CM | POA: Diagnosis not present

## 2020-02-17 DIAGNOSIS — E785 Hyperlipidemia, unspecified: Secondary | ICD-10-CM

## 2020-02-18 DIAGNOSIS — A523 Neurosyphilis, unspecified: Secondary | ICD-10-CM | POA: Diagnosis not present

## 2020-02-18 DIAGNOSIS — B20 Human immunodeficiency virus [HIV] disease: Secondary | ICD-10-CM | POA: Diagnosis not present

## 2020-02-18 LAB — LIPID PANEL
Chol/HDL Ratio: 6.3 ratio — ABNORMAL HIGH (ref 0.0–5.0)
Cholesterol, Total: 219 mg/dL — ABNORMAL HIGH (ref 100–199)
HDL: 35 mg/dL — ABNORMAL LOW (ref >39)
LDL Chol Calc (NIH): 153 mg/dL — ABNORMAL HIGH (ref 0–99)
Triglycerides: 170 mg/dL — ABNORMAL HIGH (ref 0–149)
VLDL Cholesterol Cal: 31 mg/dL (ref 5–40)

## 2020-02-19 ENCOUNTER — Ambulatory Visit: Payer: Self-pay

## 2020-02-20 ENCOUNTER — Ambulatory Visit (INDEPENDENT_AMBULATORY_CARE_PROVIDER_SITE_OTHER): Payer: Medicaid Other | Admitting: *Deleted

## 2020-02-20 ENCOUNTER — Other Ambulatory Visit: Payer: Self-pay

## 2020-02-20 ENCOUNTER — Other Ambulatory Visit: Payer: Self-pay | Admitting: Infectious Disease

## 2020-02-20 DIAGNOSIS — A539 Syphilis, unspecified: Secondary | ICD-10-CM

## 2020-02-20 DIAGNOSIS — A523 Neurosyphilis, unspecified: Secondary | ICD-10-CM | POA: Diagnosis not present

## 2020-02-20 DIAGNOSIS — B582 Toxoplasma meningoencephalitis: Secondary | ICD-10-CM

## 2020-02-20 MED ORDER — PENICILLIN G BENZATHINE 1200000 UNIT/2ML IM SUSP
1.2000 10*6.[IU] | Freq: Once | INTRAMUSCULAR | Status: AC
  Administered 2020-02-20: 1.2 10*6.[IU] via INTRAMUSCULAR

## 2020-02-20 NOTE — Progress Notes (Signed)
Patient here for PICC pull and injection of 2.4 million units of Bicillin after completion of 2 weeks of IV penicillin.. Per verbal order from Dr [name], 39 cm Single Lumen Peripherally Inserted Central Catheter removed from right basilic, tip intact. No sutures present. RN confirmed length per chart. Dressing was clean and dry. Petroleum dressing applied. Pt advised no heavy lifting with this arm, leave dressing for 24 hours and call the office or seek emergent care if dressing becomes soaked with blood or swelling or sharp pain presents. Patient verbalized understanding and agreement.  Patient's questions answered to their satisfaction.  Patient then got up for bicillin injections. After first gluteal injection, patient stated that his PICC site was bleeding.  RN applied pressure with new gauze, assisted patient to get dressed and sit back down. Pressure applied for 10 minutes after bleeding stopped, arm re-dressed with ace bandage wrapped lightly on top.  RN called to check on patient 2 hours later. He has not had any additional bleeding. Andree Coss, RN

## 2020-02-21 ENCOUNTER — Other Ambulatory Visit: Payer: Self-pay

## 2020-02-21 DIAGNOSIS — B582 Toxoplasma meningoencephalitis: Secondary | ICD-10-CM

## 2020-02-21 MED ORDER — SULFADIAZINE 500 MG PO TABS: 1000.0000 mg | ORAL_TABLET | Freq: Two times a day (BID) | ORAL | 5 refills | Status: DC

## 2020-02-21 MED ORDER — LEUCOVORIN CALCIUM 25 MG PO TABS: 25.0000 mg | ORAL_TABLET | Freq: Every day | ORAL | 5 refills | Status: DC

## 2020-02-21 MED ORDER — PYRIMETHAMINE 25 MG PO TABS: 50.0000 mg | ORAL_TABLET | Freq: Every day | ORAL | 5 refills | Status: DC

## 2020-02-25 ENCOUNTER — Telehealth: Payer: Self-pay | Admitting: Infectious Disease

## 2020-02-25 DIAGNOSIS — T50905A Adverse effect of unspecified drugs, medicaments and biological substances, initial encounter: Secondary | ICD-10-CM

## 2020-02-25 NOTE — Telephone Encounter (Signed)
I saw that the platelets were 107 on Arthur [name]'s labs from May 24.  Platelets were normal in April when last checked.  Is he getting a CBC today?  If not we should get 1 as soon as possible.  I am worried he could be having penicillin induced thrombocytopenia  [name] would you think to switch to ceftriaxone IV or po doxycycline if this is happening  He is on PCN for 2 weeks for NS

## 2020-02-25 NOTE — Telephone Encounter (Signed)
He completed his 2 weeks of Penicillin and his PICC was pulled when he saw you in clinic on 5/27. If we need repeat, he will need to be scheduled for a visit I suppose.

## 2020-02-25 NOTE — Telephone Encounter (Signed)
Spoke with Ms. [name]. She will bring Broady in 6/3 at 11 for repeat CBC. Order placed. Andree Coss, RN

## 2020-02-25 NOTE — Addendum Note (Signed)
Addended by: Andree Coss on: 02/25/2020 11:01 AM   Modules accepted: Orders

## 2020-02-25 NOTE — Telephone Encounter (Signed)
Oh I see she did pull it. Probably good idea for him to come back for CBC this week if possible

## 2020-02-25 NOTE — Telephone Encounter (Signed)
I'll make sure he is getting a repeat CBC. I would do ceftriaxone if we have to switch.. hopefully he can push through the 2 weeks

## 2020-02-26 NOTE — Telephone Encounter (Signed)
Perfect

## 2020-02-27 ENCOUNTER — Other Ambulatory Visit: Payer: Self-pay

## 2020-02-27 ENCOUNTER — Other Ambulatory Visit: Payer: Medicaid Other

## 2020-02-27 DIAGNOSIS — B20 Human immunodeficiency virus [HIV] disease: Secondary | ICD-10-CM | POA: Diagnosis not present

## 2020-02-27 DIAGNOSIS — D6959 Other secondary thrombocytopenia: Secondary | ICD-10-CM

## 2020-02-27 DIAGNOSIS — T50905A Adverse effect of unspecified drugs, medicaments and biological substances, initial encounter: Secondary | ICD-10-CM | POA: Diagnosis not present

## 2020-02-28 LAB — T-HELPER CELL (CD4) - (RCID CLINIC ONLY)
CD4 % Helper T Cell: 5 % — ABNORMAL LOW (ref 33–65)
CD4 T Cell Abs: 52 /uL — ABNORMAL LOW (ref 400–1790)

## 2020-03-04 LAB — CBC WITH DIFFERENTIAL/PLATELET
Absolute Monocytes: 442 cells/uL (ref 200–950)
Basophils Absolute: 20 cells/uL (ref 0–200)
Basophils Relative: 0.6 %
Eosinophils Absolute: 221 cells/uL (ref 15–500)
Eosinophils Relative: 6.7 %
HCT: 41.9 % (ref 38.5–50.0)
Hemoglobin: 14.3 g/dL (ref 13.2–17.1)
Lymphs Abs: 1145 cells/uL (ref 850–3900)
MCH: 32.2 pg (ref 27.0–33.0)
MCHC: 34.1 g/dL (ref 32.0–36.0)
MCV: 94.4 fL (ref 80.0–100.0)
MPV: 9.1 fL (ref 7.5–12.5)
Monocytes Relative: 13.4 %
Neutro Abs: 1472 cells/uL — ABNORMAL LOW (ref 1500–7800)
Neutrophils Relative %: 44.6 %
Platelets: 321 10*3/uL (ref 140–400)
RBC: 4.44 10*6/uL (ref 4.20–5.80)
RDW: 13.8 % (ref 11.0–15.0)
Total Lymphocyte: 34.7 %
WBC: 3.3 10*3/uL — ABNORMAL LOW (ref 3.8–10.8)

## 2020-03-04 LAB — COMPLETE METABOLIC PANEL WITH GFR
AG Ratio: 0.9 (calc) — ABNORMAL LOW (ref 1.0–2.5)
ALT: 14 U/L (ref 9–46)
AST: 17 U/L (ref 10–40)
Albumin: 4 g/dL (ref 3.6–5.1)
Alkaline phosphatase (APISO): 65 U/L (ref 36–130)
BUN: 9 mg/dL (ref 7–25)
CO2: 25 mmol/L (ref 20–32)
Calcium: 9.4 mg/dL (ref 8.6–10.3)
Chloride: 101 mmol/L (ref 98–110)
Creat: 1.09 mg/dL (ref 0.60–1.35)
GFR, Est African American: 101 mL/min/{1.73_m2} (ref >=60)
GFR, Est Non African American: 87 mL/min/{1.73_m2} (ref >=60)
Globulin: 4.7 g/dL (calc) — ABNORMAL HIGH (ref 1.9–3.7)
Glucose, Bld: 72 mg/dL (ref 65–99)
Potassium: 4.2 mmol/L (ref 3.5–5.3)
Sodium: 137 mmol/L (ref 135–146)
Total Bilirubin: 0.2 mg/dL (ref 0.2–1.2)
Total Protein: 8.7 g/dL — ABNORMAL HIGH (ref 6.1–8.1)

## 2020-03-04 LAB — HIV-1 RNA ULTRAQUANT REFLEX TO GENTYP+
HIV 1 RNA Quant: <20 copies/mL
HIV-1 RNA Quant, Log: <1.3 Log copies/mL

## 2020-03-31 ENCOUNTER — Other Ambulatory Visit: Payer: Self-pay

## 2020-03-31 ENCOUNTER — Ambulatory Visit (INDEPENDENT_AMBULATORY_CARE_PROVIDER_SITE_OTHER): Payer: Medicaid Other | Admitting: Infectious Disease

## 2020-03-31 ENCOUNTER — Encounter: Payer: Self-pay | Admitting: Infectious Disease

## 2020-03-31 VITALS — BP 123/82 | HR 109 | Wt 193.0 lb

## 2020-03-31 DIAGNOSIS — B582 Toxoplasma meningoencephalitis: Secondary | ICD-10-CM | POA: Diagnosis not present

## 2020-03-31 DIAGNOSIS — A523 Neurosyphilis, unspecified: Secondary | ICD-10-CM

## 2020-03-31 DIAGNOSIS — B20 Human immunodeficiency virus [HIV] disease: Secondary | ICD-10-CM

## 2020-03-31 MED ORDER — BIKTARVY 50-200-25 MG PO TABS
1.0000 | ORAL_TABLET | Freq: Every day | ORAL | 11 refills | Status: DC
Start: 2020-03-31 — End: 2020-12-23

## 2020-03-31 MED ORDER — RUKOBIA 600 MG PO TB12: 1.0000 | ORAL_TABLET | Freq: Two times a day (BID) | ORAL | 11 refills | Status: DC
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
## 2020-04-06 ENCOUNTER — Ambulatory Visit: Payer: Self-pay | Admitting: Infectious Disease

## 2020-05-20 ENCOUNTER — Other Ambulatory Visit: Payer: Medicaid Other

## 2020-05-20 ENCOUNTER — Other Ambulatory Visit: Payer: Self-pay

## 2020-05-20 DIAGNOSIS — B20 Human immunodeficiency virus [HIV] disease: Secondary | ICD-10-CM

## 2020-05-21 LAB — T-HELPER CELL (CD4) - (RCID CLINIC ONLY)
CD4 % Helper T Cell: 5 % — ABNORMAL LOW (ref 33–65)
CD4 T Cell Abs: 56 /uL — ABNORMAL LOW (ref 400–1790)

## 2020-05-22 LAB — HIV-1 RNA QUANT-NO REFLEX-BLD
HIV 1 RNA Quant: <20 Copies/mL
HIV-1 RNA Quant, Log: <1.3 Log cps/mL

## 2020-05-27 ENCOUNTER — Telehealth: Payer: Self-pay

## 2020-05-27 NOTE — Telephone Encounter (Signed)
Have him continue JUST biktarvy it should not have anything to do w ssx though Rukobia could

## 2020-05-27 NOTE — Telephone Encounter (Signed)
Incoming call received by patient's mother she reports for the past week patient has been experiencing head aches and "dry heaving". Mother states she thinks could could be related to new medications recently added [place] and [place]) Mother states patient is taking with small snack and full glass of water.  Routing to provider for advise. Zenaida Niece dam [place]

## 2020-05-28 NOTE — Telephone Encounter (Signed)
Noted. Patient's mother made aware to only have patient take Susanne Borders, Mother verbalized understanding.

## 2020-05-29 NOTE — Telephone Encounter (Signed)
Perfect

## 2020-06-03 ENCOUNTER — Ambulatory Visit (INDEPENDENT_AMBULATORY_CARE_PROVIDER_SITE_OTHER): Payer: Medicaid Other | Admitting: Infectious Disease

## 2020-06-03 ENCOUNTER — Encounter: Payer: Self-pay | Admitting: Infectious Disease

## 2020-06-03 ENCOUNTER — Other Ambulatory Visit: Payer: Self-pay

## 2020-06-03 ENCOUNTER — Ambulatory Visit: Payer: Self-pay

## 2020-06-03 VITALS — Wt 194.0 lb

## 2020-06-03 DIAGNOSIS — B582 Toxoplasma meningoencephalitis: Secondary | ICD-10-CM

## 2020-06-03 DIAGNOSIS — B20 Human immunodeficiency virus [HIV] disease: Secondary | ICD-10-CM | POA: Diagnosis not present

## 2020-06-03 DIAGNOSIS — R635 Abnormal weight gain: Secondary | ICD-10-CM

## 2020-06-03 DIAGNOSIS — A523 Neurosyphilis, unspecified: Secondary | ICD-10-CM | POA: Diagnosis not present

## 2020-06-03 DIAGNOSIS — Z23 Encounter for immunization: Secondary | ICD-10-CM

## 2020-06-03 NOTE — Progress Notes (Signed)
   Covid-19 Vaccination Clinic  Name:  Arthur [name]    MRN: [id number] DOB: 07/08/1981  06/03/2020  Arthur [name] was observed post Covid-19 immunization for 15 minutes without incident. He was provided with Vaccine Information Sheet and instruction to access the V-Safe system.   Arthur [name] was instructed to call 911 with any severe reactions post vaccine: [name] Difficulty breathing  . Swelling of face and throat  . A fast heartbeat  . A bad rash all over body  . Dizziness and weakness   Immunizations Administered    Name Date Dose VIS Date Route   Pfizer COVID-19 Vaccine 06/03/2020 11:30 AM 0.3 mL 11/20/2018 Intramuscular   Manufacturer: [employer], [employer]   Lot: [id number]   NDC: [address]-8    Cliffard Hair T Pricilla Loveless

## 2020-06-03 NOTE — Progress Notes (Signed)
Chief concern about weight gain  Subjective:    Patient ID: Arthur [name], male    DOB: [birth year], 39 y.o.   MRN: [id number]  HPI   Arthur [name] is a 39 year old African-American man living with HIV who  fell out of care and was not seen by me since 2015.  He previously failed Atripla with resistance and IK 103 when he is being seen by Dr. Philipp Deputy.  I changed him to a protease-based regimen with Prezista Norvir and Truvada but he had not been seen since 2015.  He was admitted to the hospital with profound encephalopathy.  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  He was also broadly treated for other possible CNS pathology including CMV encephalitis with ganciclovir RI PE for possible tuberculosis with steroids.  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  He was ultimately discharged from the inpatient hospital service to inpatient rehab and completed therapy while there.  Profound dysphagia and nutritional deficiency he was given a PEG tube through which she was getting his medications along with nutrition.  He is living at home with his mother and also his sister who is a CMA and have been overseeing his medications.  He has had dramatic improvement in his neurological status and maintained perfect virological control. We retreated him for Neurosyphilis when his titers went up. He had low hgb while on PCN but never verified this prior to completion.  His CD4 is slow to come up.  I added Rukobia in hopes to help increase his CD4 count but he did not tolerate this medication with nausea and vomiting.  He has remained suppressed.  He is concerned about weight gain and has gained nearly 100#. He also wants to be able to drive again but he is partly blind and I am also concerned about how close to his baseline his cognition and motor abilities he has come.  He has not yet been vaccinated vs  COVID 19--which we will do today.    Past Medical History:  Diagnosis Date  . Anemia   . Anxiety   . Depression   . GERD (gastroesophageal reflux disease)   . HIV infection (HCC)   . Substance abuse [facility])     Past Surgical History:  Procedure Laterality Date  . IR GASTROSTOMY TUBE MOD SED  09/02/2019  . IR GASTROSTOMY TUBE REMOVAL  10/28/2019  . TOOTH EXTRACTION      Family History  Problem Relation Age of Onset  . Diabetes Neg Hx   . CAD Neg Hx   . Hypertension Neg Hx       Social History   Socioeconomic History  . Marital status: Single    Spouse name: Not on file  . Number of children: Not on file  . Years of education: Not on file  . Highest education level: Not on file  Occupational History  . Not on file  Tobacco Use  . Smoking status: Current Every Day Smoker    Packs/day: 0.20    Years: 17.00    Pack years: 3.40  . Smokeless tobacco: Never Used  . Tobacco comment: encouraged to quit  Vaping Use  . Vaping Use: Never used  Substance and Sexual Activity  . Alcohol use: No    Comment: occassionally  . Drug use: Not Currently    Types: Marijuana  . Sexual activity: Not Currently    Partners: Male  Comment: pt. declined condoms  Other Topics Concern  . Not on file  Social History Narrative  . Not on file   Social Determinants of Health   Financial Resource Strain:   . Difficulty of Paying Living Expenses: Not on file  Food Insecurity:   . Worried About Programme researcher, broadcasting/film/video in the Last Year: Not on file  . Ran Out of Food in the Last Year: Not on file  Transportation Needs:   . Lack of Transportation (Medical): Not on file  . Lack of Transportation (Non-Medical): Not on file  Physical Activity:   . Days of Exercise per Week: Not on file  . Minutes of Exercise per Session: Not on file  Stress:   . Feeling of Stress : Not on file  Social Connections:   . Frequency of Communication with Friends and Family: Not on file  . Frequency of Social  Gatherings with Friends and Family: Not on file  . Attends Religious Services: Not on file  . Active Member of Clubs or Organizations: Not on file  . Attends Banker Meetings: Not on file  . Marital Status: Not on file    No Known Allergies   Current Outpatient Medications:  .  acetaminophen (TYLENOL) 500 MG tablet, Take 500 mg by mouth every 6 (six) hours as needed., Disp: , Rfl:  .  atorvastatin (LIPITOR) 20 MG tablet, Take 1 tablet (20 mg total) by mouth daily., Disp: 90 tablet, Rfl: 3 .  bictegravir-emtricitabine-tenofovir AF (BIKTARVY) 50-200-25 MG TABS tablet, Take 1 tablet by mouth daily., Disp: 30 tablet, Rfl: 11 .  citalopram (CELEXA) 20 MG tablet, Take 1 tablet (20 mg total) by mouth daily., Disp: 30 tablet, Rfl: 3 .  famotidine (PEPCID) 20 MG tablet, Take 1 tablet (20 mg total) by mouth at bedtime., Disp: 30 tablet, Rfl: 11 .  leucovorin (WELLCOVORIN) 25 MG tablet, Take 1 tablet (25 mg total) by mouth daily., Disp: 30 tablet, Rfl: 5 .  Menthol-Methyl Salicylate (MUSCLE RUB) 10-15 % CREA, Apply 1 application topically as needed for muscle pain., Disp:  , Rfl: 0 .  ondansetron (ZOFRAN) 4 MG tablet, Take 1 tablet (4 mg total) by mouth every 6 (six) hours as needed for nausea., Disp: 20 tablet, Rfl: 0 .  pyrimethamine (DARAPRIM) 25 MG tablet, Take 2 tablets (50 mg total) by mouth daily with breakfast., Disp: 60 tablet, Rfl: 5 .  sulfaDIAZINE 500 MG tablet, Take 2 tablets (1,000 mg total) by mouth 2 (two) times daily., Disp: 120 tablet, Rfl: 5 .  sulfamethoxazole-trimethoprim (BACTRIM DS) 800-160 MG tablet, Take 1 tablet by mouth 3 (three) times a week., Disp: 12 tablet, Rfl: 11 .  SUMAtriptan (IMITREX) 50 MG tablet, Take 1 tablet (50 mg total) by mouth every 2 (two) hours as needed for migraine. May repeat in 2 hours if headache persists or recurs., Disp: 10 tablet, Rfl: 0 .  Fostemsavir Tromethamine ER (RUKOBIA) 600 MG TB12, Take 1 tablet by mouth every 12 (twelve) hours.  (Patient not taking: Reported on 06/03/2020), Disp: 60 tablet, Rfl: 11 .  terbinafine (LAMISIL) 1 % cream, Apply topically daily. Apply to feet daily, Disp: 30 g, Rfl: 0   Review of Systems  Unable to perform ROS: Mental status change       Objective:   Physical Exam Constitutional:      Appearance: He is overweight.  HENT:     Head: Normocephalic.     Nose: Nose normal.  Eyes:  Extraocular Movements: Extraocular movements intact.  Cardiovascular:     Rate and Rhythm: Normal rate and regular rhythm.  Pulmonary:     Effort: Pulmonary effort is normal. No respiratory distress.  Neurological:     Mental Status: He is alert.  Psychiatric:        Attention and Perception: Attention normal.        Mood and Affect: Mood normal.        Speech: Speech is delayed.        Behavior: Behavior is cooperative.        Cognition and Memory: Cognition is impaired.     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  Hopefully he can recover as much as possible.  I do not think it is realistic that he operate a motor vehicle any time soon but I will refer to PM&R to maximize his recovery.  COVID 19 prevention: give Pfizer no 1 today (and would give him 3 doses)

## 2020-06-10 ENCOUNTER — Encounter: Payer: Self-pay | Admitting: Physical Medicine and Rehabilitation

## 2020-06-19 ENCOUNTER — Telehealth: Payer: Self-pay

## 2020-06-19 NOTE — Telephone Encounter (Signed)
Patient's pharmacy called to confirm HIV regimen. Per last note patient is to continue Biktarvy. Pharmacy will go ahead and fill. Lorenso Courier, [place]

## 2020-06-24 ENCOUNTER — Ambulatory Visit: Payer: Medicaid Other

## 2020-07-03 ENCOUNTER — Ambulatory Visit: Payer: Medicaid Other

## 2020-07-03 ENCOUNTER — Other Ambulatory Visit: Payer: Self-pay

## 2020-07-03 ENCOUNTER — Ambulatory Visit (INDEPENDENT_AMBULATORY_CARE_PROVIDER_SITE_OTHER): Payer: Medicaid Other

## 2020-07-03 DIAGNOSIS — Z23 Encounter for immunization: Secondary | ICD-10-CM

## 2020-07-03 NOTE — Progress Notes (Signed)
   Covid-19 Vaccination Clinic  Name:  Arthur [name]    MRN: [id number] DOB: 21-Aug-1981  07/03/2020  Arthur [name] was observed post Covid-19 immunization for 15 minutes without incident. He was provided with Vaccine Information Sheet and instruction to access the V-Safe system.   Arthur [name] was instructed to call 911 with any severe reactions post vaccine: [name] Difficulty breathing  . Swelling of face and throat  . A fast heartbeat  . A bad rash all over body  . Dizziness and weakness   Immunizations Administered    Name Date Dose VIS Date Route   Pfizer COVID-19 Vaccine 07/03/2020 11:59 AM 0.3 mL 11/20/2018 Intramuscular   Manufacturer: [employer], [employer]   Lot: [id number]   NDC: [address]-8     Sandie Ano, RN

## 2020-07-20 ENCOUNTER — Encounter
Payer: Medicaid Other | Attending: Physical Medicine and Rehabilitation | Admitting: Physical Medicine and Rehabilitation

## 2020-08-27 ENCOUNTER — Other Ambulatory Visit: Payer: Self-pay

## 2020-08-27 ENCOUNTER — Other Ambulatory Visit: Payer: Self-pay | Admitting: Infectious Disease

## 2020-08-27 DIAGNOSIS — B582 Toxoplasma meningoencephalitis: Secondary | ICD-10-CM

## 2020-08-27 MED ORDER — PYRIMETHAMINE 25 MG PO TABS: 50.0000 mg | ORAL_TABLET | Freq: Every day | ORAL | 5 refills | Status: DC

## 2020-08-27 MED ORDER — LEUCOVORIN CALCIUM 25 MG PO TABS: 25.0000 mg | ORAL_TABLET | Freq: Every day | ORAL | 5 refills | Status: DC

## 2020-08-27 MED ORDER — SULFADIAZINE 500 MG PO TABS: 1000.0000 mg | ORAL_TABLET | Freq: Two times a day (BID) | ORAL | 5 refills | Status: DC

## 2020-09-15 ENCOUNTER — Encounter: Payer: Self-pay | Admitting: Infectious Disease

## 2020-09-15 ENCOUNTER — Ambulatory Visit (INDEPENDENT_AMBULATORY_CARE_PROVIDER_SITE_OTHER): Payer: Medicaid Other | Admitting: Infectious Disease

## 2020-09-15 ENCOUNTER — Other Ambulatory Visit: Payer: Self-pay

## 2020-09-15 VITALS — BP 126/86 | HR 92 | Temp 97.6°F | Wt 191.0 lb

## 2020-09-15 DIAGNOSIS — A523 Neurosyphilis, unspecified: Secondary | ICD-10-CM | POA: Diagnosis not present

## 2020-09-15 DIAGNOSIS — F0280 Dementia in other diseases classified elsewhere without behavioral disturbance: Secondary | ICD-10-CM

## 2020-09-15 DIAGNOSIS — R21 Rash and other nonspecific skin eruption: Secondary | ICD-10-CM | POA: Diagnosis not present

## 2020-09-15 DIAGNOSIS — B582 Toxoplasma meningoencephalitis: Secondary | ICD-10-CM

## 2020-09-15 DIAGNOSIS — G4700 Insomnia, unspecified: Secondary | ICD-10-CM | POA: Diagnosis not present

## 2020-09-15 DIAGNOSIS — R9089 Other abnormal findings on diagnostic imaging of central nervous system: Secondary | ICD-10-CM

## 2020-09-15 DIAGNOSIS — B20 Human immunodeficiency virus [HIV] disease: Secondary | ICD-10-CM

## 2020-09-15 MED ORDER — HYDROXYZINE HCL 25 MG PO TABS: 25.0000 mg | ORAL_TABLET | Freq: Every evening | ORAL | 1 refills | Status: DC | PRN

## 2020-09-15 NOTE — Patient Instructions (Signed)
I would like you to start taking the BIKTARVY in the morning rather than the evening and this MIGHT help with your difficulty sleeping  If not you can also try hydroxyzine at bed time to help with sleep

## 2020-09-15 NOTE — Progress Notes (Signed)
Chief concern about weight gain  Subjective:    Patient ID: Arthur [name], male    DOB: 1981/01/19, 39 y.o.   MRN: [id number]  HPI   Arthur [name] is a 39 year-old African-American man living with HIV who  fell out of care and was not seen by me since 2015.  He previously failed Atripla with resistance and IK 103 when he is being seen by Dr. Philipp Deputy.  I changed him to a protease-based regimen with Prezista Norvir and Truvada but he had not been seen since 2015.  He was admitted to the hospital with profound encephalopathy.  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  He was also broadly treated for other possible CNS pathology including CMV encephalitis with ganciclovir RI PE for possible tuberculosis with steroids.  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  He was ultimately discharged from the inpatient hospital service to inpatient rehab and completed therapy while there.  Profound dysphagia and nutritional deficiency he was given a PEG tube through which she was getting his medications along with nutrition.  He is living at home with his mother and also his sister who is a CMA and have been overseeing his medications.  He has had dramatic improvement in his neurological status and maintained perfect virological control. We retreated him for Neurosyphilis when his titers went up. He had low hgb while on PCN but never verified this prior to completion.  His CD4 is slow to come up.  I added Rukobia in hopes to help increase his CD4 count but he did not tolerate this medication with nausea and vomiting.  He has remained suppressed.  Rice comes to clinic actually by himself today and tells me he is concerned about difficulty sleeping and said he was taking an over-the-counter sleep medication.  He also says that he has a rash on his hands and his mother is worried that "his infection might  be back.  I told him that syphilis could certainly show up with a rash on his palms as she is showing me but he claims to have [place] no sexual activity.  Certainly neurosyphilis can come back otherwise but would not come back with a rash on the hands    Past Medical History:  Diagnosis Date  . Anemia   . Anxiety   . Depression   . GERD (gastroesophageal reflux disease)   . HIV infection (HCC)   . Substance abuse [facility])     Past Surgical History:  Procedure Laterality Date  . IR GASTROSTOMY TUBE MOD SED  09/02/2019  . IR GASTROSTOMY TUBE REMOVAL  10/28/2019  . TOOTH EXTRACTION      Family History  Problem Relation Age of Onset  . Diabetes Neg Hx   . CAD Neg Hx   . Hypertension Neg Hx       Social History   Socioeconomic History  . Marital status: Single    Spouse name: Not on file  . Number of children: Not on file  . Years of education: Not on file  . Highest education level: Not on file  Occupational History  . Not on file  Tobacco Use  . Smoking status: Current Every Day Smoker    Packs/day: 0.20    Years: 17.00    Pack years: 3.40  . Smokeless tobacco: Never Used  . Tobacco comment: encouraged to quit  Vaping Use  . Vaping Use: Never  used  Substance and Sexual Activity  . Alcohol use: No    Comment: occassionally  . Drug use: Not Currently    Types: Marijuana  . Sexual activity: Not Currently    Partners: Male    Comment: declined condoms 09/15/20  Other Topics Concern  . Not on file  Social History Narrative  . Not on file   Social Determinants of Health   Financial Resource Strain: Not on file  Food Insecurity: Not on file  Transportation Needs: Not on file  Physical Activity: Not on file  Stress: Not on file  Social Connections: Not on file    No Known Allergies   Current Outpatient Medications:  .  acetaminophen (TYLENOL) 500 MG tablet, Take 500 mg by mouth every 6 (six) hours as needed., Disp: , Rfl:  .  atorvastatin (LIPITOR) 20 MG  tablet, Take 1 tablet (20 mg total) by mouth daily., Disp: 90 tablet, Rfl: 3 .  bictegravir-emtricitabine-tenofovir AF (BIKTARVY) 50-200-25 MG TABS tablet, Take 1 tablet by mouth daily., Disp: 30 tablet, Rfl: 11 .  citalopram (CELEXA) 20 MG tablet, Take 1 tablet (20 mg total) by mouth daily., Disp: 30 tablet, Rfl: 3 .  famotidine (PEPCID) 20 MG tablet, Take 1 tablet (20 mg total) by mouth at bedtime., Disp: 30 tablet, Rfl: 11 .  Fostemsavir Tromethamine ER (RUKOBIA) 600 MG TB12, Take 1 tablet by mouth every 12 (twelve) hours., Disp: 60 tablet, Rfl: 11 .  leucovorin (WELLCOVORIN) 25 MG tablet, Take 1 tablet (25 mg total) by mouth daily., Disp: 30 tablet, Rfl: 5 .  Menthol-Methyl Salicylate (MUSCLE RUB) 10-15 % CREA, Apply 1 application topically as needed for muscle pain., Disp:  , Rfl: 0 .  ondansetron (ZOFRAN) 4 MG tablet, Take 1 tablet (4 mg total) by mouth every 6 (six) hours as needed for nausea., Disp: 20 tablet, Rfl: 0 .  pyrimethamine (DARAPRIM) 25 MG tablet, Take 2 tablets (50 mg total) by mouth daily with breakfast., Disp: 60 tablet, Rfl: 5 .  sulfaDIAZINE 500 MG tablet, Take 2 tablets (1,000 mg total) by mouth 2 (two) times daily., Disp: 120 tablet, Rfl: 5 .  sulfamethoxazole-trimethoprim (BACTRIM DS) 800-160 MG tablet, Take 1 tablet by mouth 3 (three) times a week., Disp: 12 tablet, Rfl: 11 .  SUMAtriptan (IMITREX) 50 MG tablet, Take 1 tablet (50 mg total) by mouth every 2 (two) hours as needed for migraine. May repeat in 2 hours if headache persists or recurs., Disp: 10 tablet, Rfl: 0 .  terbinafine (LAMISIL) 1 % cream, Apply topically daily. Apply to feet daily, Disp: 30 g, Rfl: 0   Review of Systems  Unable to perform ROS: Mental status change  Psychiatric/Behavioral: Positive for sleep disturbance.       Objective:   Physical Exam Constitutional:      Appearance: He is overweight.  HENT:     Head: Normocephalic.     Nose: Nose normal.  Eyes:     Extraocular Movements:  Extraocular movements intact.  Cardiovascular:     Rate and Rhythm: Normal rate and regular rhythm.  Pulmonary:     Effort: Pulmonary effort is normal. No respiratory distress.  Neurological:     Mental Status: He is alert.  Psychiatric:        Attention and Perception: Attention normal.        Mood and Affect: Mood is depressed.        Speech: Speech is delayed.        Behavior: Behavior is  cooperative.        Cognition and Memory: Cognition is impaired. Memory is impaired.     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

## 2020-09-16 ENCOUNTER — Telehealth: Payer: Self-pay

## 2020-09-16 ENCOUNTER — Other Ambulatory Visit: Payer: Self-pay

## 2020-09-16 LAB — T-HELPER CELL (CD4) - (RCID CLINIC ONLY)
CD4 % Helper T Cell: 5 % — ABNORMAL LOW (ref 33–65)
CD4 T Cell Abs: 54 /uL — ABNORMAL LOW (ref 400–1790)

## 2020-09-16 MED ORDER — DOXYCYCLINE HYCLATE 100 MG PO TABS: 100.0000 mg | ORAL_TABLET | Freq: Two times a day (BID) | ORAL | 0 refills | Status: AC

## 2020-09-16 NOTE — Telephone Encounter (Signed)
Patient's mother informed of lab results and verbalized understanding. Patient's mother would like for patient to do doxycycline for treatment. Doxycycline has been sent to the pharmacy. Migdalia Olejniczak T Pricilla Loveless

## 2020-09-16 NOTE — Progress Notes (Signed)
doycy

## 2020-09-16 NOTE — Telephone Encounter (Signed)
-----   Message from Randall Hiss, MD sent at 09/16/2020  3:18 PM EST ----- RPR down 2 fold which is ok he did have lesions on palms that could have been c/w with syphilis but titer is going the wrong way for this to be new. He has already been treated twice with IV pencillin  I would be ok with giving him IM PCN weekly x 3  28 days of doxycycline 100mg  twice daily to :"hedge our bets" pt/famliy can decide

## 2020-09-28 ENCOUNTER — Other Ambulatory Visit: Payer: Self-pay | Admitting: Infectious Disease

## 2020-09-29 LAB — CBC WITH DIFFERENTIAL/PLATELET
Absolute Monocytes: 387 cells/uL (ref 200–950)
Basophils Absolute: 9 cells/uL (ref 0–200)
Basophils Relative: 0.3 %
Eosinophils Absolute: 219 cells/uL (ref 15–500)
Eosinophils Relative: 7.3 %
HCT: 45.2 % (ref 38.5–50.0)
Hemoglobin: 15.4 g/dL (ref 13.2–17.1)
Lymphs Abs: 1158 cells/uL (ref 850–3900)
MCH: 31.8 pg (ref 27.0–33.0)
MCHC: 34.1 g/dL (ref 32.0–36.0)
MCV: 93.2 fL (ref 80.0–100.0)
MPV: 9.2 fL (ref 7.5–12.5)
Monocytes Relative: 12.9 %
Neutro Abs: 1227 cells/uL — ABNORMAL LOW (ref 1500–7800)
Neutrophils Relative %: 40.9 %
Platelets: 290 10*3/uL (ref 140–400)
RBC: 4.85 10*6/uL (ref 4.20–5.80)
RDW: 13.7 % (ref 11.0–15.0)
Total Lymphocyte: 38.6 %
WBC: 3 10*3/uL — ABNORMAL LOW (ref 3.8–10.8)

## 2020-09-29 LAB — COMPLETE METABOLIC PANEL WITH GFR
AG Ratio: 1.2 (calc) (ref 1.0–2.5)
ALT: 12 U/L (ref 9–46)
AST: 18 U/L (ref 10–40)
Albumin: 4.5 g/dL (ref 3.6–5.1)
Alkaline phosphatase (APISO): 70 U/L (ref 36–130)
BUN: 9 mg/dL (ref 7–25)
CO2: 26 mmol/L (ref 20–32)
Calcium: 9.6 mg/dL (ref 8.6–10.3)
Chloride: 104 mmol/L (ref 98–110)
Creat: 1.1 mg/dL (ref 0.60–1.35)
GFR, Est African American: 99 mL/min/{1.73_m2} (ref >=60)
GFR, Est Non African American: 85 mL/min/{1.73_m2} (ref >=60)
Globulin: 3.9 g/dL (calc) — ABNORMAL HIGH (ref 1.9–3.7)
Glucose, Bld: 87 mg/dL (ref 65–99)
Potassium: 4.4 mmol/L (ref 3.5–5.3)
Sodium: 138 mmol/L (ref 135–146)
Total Bilirubin: 0.3 mg/dL (ref 0.2–1.2)
Total Protein: 8.4 g/dL — ABNORMAL HIGH (ref 6.1–8.1)

## 2020-09-29 LAB — FLUORESCENT TREPONEMAL AB(FTA)-IGG-BLD: Fluorescent Treponemal ABS: REACTIVE — AB

## 2020-09-29 LAB — HIV-1 RNA QUANT-NO REFLEX-BLD
HIV 1 RNA Quant: <20 Copies/mL
HIV-1 RNA Quant, Log: <1.3 Log cps/mL

## 2020-09-29 LAB — RPR: RPR Ser Ql: REACTIVE — AB

## 2020-09-29 LAB — RPR TITER: RPR Titer: 1:64 {titer} — ABNORMAL HIGH

## 2020-10-23 ENCOUNTER — Other Ambulatory Visit: Payer: Self-pay | Admitting: Infectious Disease

## 2020-11-19 ENCOUNTER — Ambulatory Visit: Payer: Medicaid Other | Admitting: Infectious Disease

## 2020-12-21 DIAGNOSIS — H40033 Anatomical narrow angle, bilateral: Secondary | ICD-10-CM | POA: Diagnosis not present

## 2020-12-21 DIAGNOSIS — H5203 Hypermetropia, bilateral: Secondary | ICD-10-CM | POA: Diagnosis not present

## 2020-12-22 ENCOUNTER — Telehealth: Payer: Self-pay

## 2020-12-22 NOTE — Telephone Encounter (Signed)
-----   Message from [facility] sent at 12/22/2020  3:37 PM EDT ----- Called patient to confirm appointment for 12/23/20, patients mother just want's to let the provider know he has been getting frequent head aches in the past week. She wants the provider to check his eyes to make sure that's not the reason he is getting these headaches

## 2020-12-22 NOTE — Telephone Encounter (Signed)
I can do a cursory check of vision. He really should see an ophthalmologist for a full evaluation of eyes

## 2020-12-23 ENCOUNTER — Ambulatory Visit (INDEPENDENT_AMBULATORY_CARE_PROVIDER_SITE_OTHER): Payer: Medicaid Other | Admitting: Infectious Disease

## 2020-12-23 ENCOUNTER — Other Ambulatory Visit: Payer: Self-pay

## 2020-12-23 ENCOUNTER — Encounter: Payer: Self-pay | Admitting: Infectious Disease

## 2020-12-23 VITALS — BP 114/74 | HR 104 | Temp 98.3°F | Ht 71.0 in | Wt 196.0 lb

## 2020-12-23 DIAGNOSIS — R519 Headache, unspecified: Secondary | ICD-10-CM

## 2020-12-23 DIAGNOSIS — B20 Human immunodeficiency virus [HIV] disease: Secondary | ICD-10-CM | POA: Diagnosis not present

## 2020-12-23 DIAGNOSIS — B582 Toxoplasma meningoencephalitis: Secondary | ICD-10-CM | POA: Diagnosis not present

## 2020-12-23 DIAGNOSIS — F0280 Dementia in other diseases classified elsewhere without behavioral disturbance: Secondary | ICD-10-CM | POA: Diagnosis not present

## 2020-12-23 DIAGNOSIS — B589 Toxoplasmosis, unspecified: Secondary | ICD-10-CM | POA: Diagnosis not present

## 2020-12-23 DIAGNOSIS — A523 Neurosyphilis, unspecified: Secondary | ICD-10-CM

## 2020-12-23 MED ORDER — LEUCOVORIN CALCIUM 25 MG PO TABS: 25.0000 mg | ORAL_TABLET | Freq: Every day | ORAL | 5 refills | Status: DC

## 2020-12-23 MED ORDER — BIKTARVY 50-200-25 MG PO TABS: 1.0000 | ORAL_TABLET | Freq: Every day | ORAL | 11 refills | Status: DC

## 2020-12-23 MED ORDER — SULFADIAZINE 500 MG PO TABS: 1000.0000 mg | ORAL_TABLET | Freq: Two times a day (BID) | ORAL | 5 refills | Status: DC

## 2020-12-23 MED ORDER — PYRIMETHAMINE 25 MG PO TABS: 50.0000 mg | ORAL_TABLET | Freq: Every day | ORAL | 5 refills | Status: DC

## 2020-12-23 MED ORDER — SUMATRIPTAN SUCCINATE 50 MG PO TABS: 50.0000 mg | ORAL_TABLET | ORAL | 0 refills | Status: DC | PRN

## 2020-12-23 MED ORDER — SULFAMETHOXAZOLE-TRIMETHOPRIM 800-160 MG PO TABS: ORAL_TABLET | ORAL | 5 refills | Status: DC

## 2020-12-23 MED ORDER — HYDROXYZINE HCL 25 MG PO TABS: 25.0000 mg | ORAL_TABLET | Freq: Every evening | ORAL | 11 refills | Status: DC | PRN

## 2020-12-23 NOTE — Progress Notes (Signed)
Chief concern: Problems with headaches and insomnia Subjective:    Patient ID: Arthur [name], male    DOB: [birth year], 40 y.o.   MRN: [id number]  HPI   Arthur [name] is a 40 year-old African-American man living with HIV who  fell out of care and was not seen by me since 2015.  He previously failed Atripla with resistance and IK 103 when he is being seen by Dr. Philipp Deputy.  I changed him to a protease-based regimen with Prezista Norvir and Truvada but he had not been seen since 2015.  He was admitted to the hospital with profound encephalopathy.  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  He was also broadly treated for other possible CNS pathology including CMV encephalitis with ganciclovir RI PE for possible tuberculosis with steroids.  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  He was ultimately discharged from the inpatient hospital service to inpatient rehab and completed therapy while there.  Profound dysphagia and nutritional deficiency he was given a PEG tube through which she was getting his medications along with nutrition.  He is living at home with his mother and also his sister who is a CMA and have been overseeing his medications.  He has had dramatic improvement in his neurological status and maintained perfect virological control. We retreated him for Neurosyphilis when his titers went up. He had low hgb while on PCN but never verified this prior to completion.  His CD4 is slow to come up.  I added Rukobia in July in hopes to help increase his CD4 count but he did not tolerate this medication with nausea and vomiting.  He has remained suppressed.  Arthur [name] comes to clinic for follow-up.  He has been having headaches in the left side of his head sometimes wake him up at night.  He rates them as being about a 7 out of 10 in severity and they are also of a stabbing type of nature.   They do respond to Tylenol and typically resolve within 40 minutes.  Sometimes though they do keep him up at night.  He is having trouble with insomnia in general despite switching BIKTARVY to morning time doses.  The hydroxyzine seen does help him sleep but he does not wheeze take it.  His mother was discouraging of taking every night because she was concerned he might become addicted to it.  His mother called in on his cell phone and voiced her concerns about him continuing to have some trouble with balance and to have significant problems with his memory.  She said she had to remind him at least 3 times about the appointment today.    Past Medical History:  Diagnosis Date  . Anemia   . Anxiety   . Depression   . GERD (gastroesophageal reflux disease)   . HIV infection (HCC)   . Substance abuse [facility])     Past Surgical History:  Procedure Laterality Date  . IR GASTROSTOMY TUBE MOD SED  09/02/2019  . IR GASTROSTOMY TUBE REMOVAL  10/28/2019  . TOOTH EXTRACTION      Family History  Problem Relation Age of Onset  . Diabetes Neg Hx   . CAD Neg Hx   . Hypertension Neg Hx       Social History   Socioeconomic History  . Marital status: Single    Spouse name: Not on file  . Number of children: Not on  file  . Years of education: Not on file  . Highest education level: Not on file  Occupational History  . Not on file  Tobacco Use  . Smoking status: Current Every Day Smoker    Packs/day: 0.30    Years: 17.00    Pack years: 5.10    Types: Cigarettes  . Smokeless tobacco: Never Used  . Tobacco comment: encouraged to quit  Vaping Use  . Vaping Use: Never used  Substance and Sexual Activity  . Alcohol use: No    Comment: occassionally  . Drug use: Not Currently    Types: Marijuana  . Sexual activity: Not Currently    Partners: Male    Comment: declined condoms 09/15/20  Other Topics Concern  . Not on file  Social History Narrative  . Not on file   Social Determinants  of Health   Financial Resource Strain: Not on file  Food Insecurity: Not on file  Transportation Needs: Not on file  Physical Activity: Not on file  Stress: Not on file  Social Connections: Not on file    No Known Allergies   Current Outpatient Medications:  .  acetaminophen (TYLENOL) 500 MG tablet, Take 500 mg by mouth every 6 (six) hours as needed., Disp: , Rfl:  .  atorvastatin (LIPITOR) 20 MG tablet, Take 1 tablet (20 mg total) by mouth daily., Disp: 90 tablet, Rfl: 3 .  citalopram (CELEXA) 20 MG tablet, Take 1 tablet (20 mg total) by mouth daily., Disp: 30 tablet, Rfl: 3 .  famotidine (PEPCID) 20 MG tablet, Take 1 tablet (20 mg total) by mouth at bedtime., Disp: 30 tablet, Rfl: 11 .  hydrOXYzine (ATARAX/VISTARIL) 25 MG tablet, Take 1 tablet (25 mg total) by mouth at bedtime as needed (insomnia)., Disp: 30 tablet, Rfl: 1 .  Menthol-Methyl Salicylate (MUSCLE RUB) 10-15 % CREA, Apply 1 application topically as needed for muscle pain., Disp:  , Rfl: 0 .  ondansetron (ZOFRAN) 4 MG tablet, Take 1 tablet (4 mg total) by mouth every 6 (six) hours as needed for nausea., Disp: 20 tablet, Rfl: 0 .  SUMAtriptan (IMITREX) 50 MG tablet, Take 1 tablet (50 mg total) by mouth every 2 (two) hours as needed for migraine. May repeat in 2 hours if headache persists or recurs., Disp: 10 tablet, Rfl: 0 .  terbinafine (LAMISIL) 1 % cream, Apply topically daily. Apply to feet daily, Disp: 30 g, Rfl: 0 .  bictegravir-emtricitabine-tenofovir AF (BIKTARVY) 50-200-25 MG TABS tablet, Take 1 tablet by mouth daily., Disp: 30 tablet, Rfl: 11 .  leucovorin (WELLCOVORIN) 25 MG tablet, Take 1 tablet (25 mg total) by mouth daily., Disp: 30 tablet, Rfl: 5 .  pyrimethamine (DARAPRIM) 25 MG tablet, Take 2 tablets (50 mg total) by mouth daily with breakfast., Disp: 60 tablet, Rfl: 5 .  sulfaDIAZINE 500 MG tablet, Take 2 tablets (1,000 mg total) by mouth 2 (two) times daily., Disp: 120 tablet, Rfl: 5 .   sulfamethoxazole-trimethoprim (BACTRIM DS) 800-160 MG tablet, TAKE 1 TABLET BY MOUTH 3 TIMES A WEEK, Disp: 12 tablet, Rfl: 5   Review of Systems  Unable to perform ROS: Mental status change  Neurological: Positive for headaches.  Psychiatric/Behavioral: Positive for sleep disturbance.       Objective:   Physical Exam Constitutional:      Appearance: He is overweight.  HENT:     Head: Normocephalic.     Nose: Nose normal.  Eyes:     Extraocular Movements: Extraocular movements intact.  Cardiovascular:  Rate and Rhythm: Normal rate and regular rhythm.  Pulmonary:     Effort: Pulmonary effort is normal. No respiratory distress.  Neurological:     General: No focal deficit present.     Mental Status: He is alert and oriented to person, place, and time.  Psychiatric:        Attention and Perception: Attention normal.        Mood and Affect: Mood is depressed.        Speech: Speech is delayed.        Behavior: Behavior is cooperative.        Cognition and Memory: Cognition is impaired. Memory is impaired.     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  Could consider using on he also BIKTARVY better really do not want to mess with his baseline antiretroviral regimen  I spent greater than 40 minutes with the patient including greater than 50% of time in face to face counsel of the patient and his mother who was on his cell phone as well regarding nature of his HIV slow response of his immune system to reconstitute, CNS toxoplasmosis headaches neurocognitive deficits and in coordination of his care

## 2020-12-27 LAB — CBC WITH DIFFERENTIAL/PLATELET
Absolute Monocytes: 366 cells/uL (ref 200–950)
Basophils Absolute: 20 cells/uL (ref 0–200)
Basophils Relative: 0.6 %
Eosinophils Absolute: 198 cells/uL (ref 15–500)
Eosinophils Relative: 6 %
HCT: 45 % (ref 38.5–50.0)
Hemoglobin: 15 g/dL (ref 13.2–17.1)
Lymphs Abs: 861 cells/uL (ref 850–3900)
MCH: 31.1 pg (ref 27.0–33.0)
MCHC: 33.3 g/dL (ref 32.0–36.0)
MCV: 93.4 fL (ref 80.0–100.0)
MPV: 8.9 fL (ref 7.5–12.5)
Monocytes Relative: 11.1 %
Neutro Abs: 1855 cells/uL (ref 1500–7800)
Neutrophils Relative %: 56.2 %
Platelets: 324 10*3/uL (ref 140–400)
RBC: 4.82 10*6/uL (ref 4.20–5.80)
RDW: 13.5 % (ref 11.0–15.0)
Total Lymphocyte: 26.1 %
WBC: 3.3 10*3/uL — ABNORMAL LOW (ref 3.8–10.8)

## 2020-12-27 LAB — CRYPTOCOCCAL AG, LTX SCR RFLX TITER
Cryptococcal Ag Screen: NOT DETECTED
MICRO NUMBER:: 11711345
SPECIMEN QUALITY:: ADEQUATE

## 2020-12-27 LAB — COMPLETE METABOLIC PANEL WITH GFR
AG Ratio: 1.1 (calc) (ref 1.0–2.5)
ALT: 11 U/L (ref 9–46)
AST: 16 U/L (ref 10–40)
Albumin: 4.1 g/dL (ref 3.6–5.1)
Alkaline phosphatase (APISO): 66 U/L (ref 36–130)
BUN: 10 mg/dL (ref 7–25)
CO2: 24 mmol/L (ref 20–32)
Calcium: 9.1 mg/dL (ref 8.6–10.3)
Chloride: 104 mmol/L (ref 98–110)
Creat: 1.2 mg/dL (ref 0.60–1.35)
GFR, Est African American: 89 mL/min/{1.73_m2} (ref >=60)
GFR, Est Non African American: 77 mL/min/{1.73_m2} (ref >=60)
Globulin: 3.7 g/dL (calc) (ref 1.9–3.7)
Glucose, Bld: 86 mg/dL (ref 65–99)
Potassium: 4.3 mmol/L (ref 3.5–5.3)
Sodium: 139 mmol/L (ref 135–146)
Total Bilirubin: 0.3 mg/dL (ref 0.2–1.2)
Total Protein: 7.8 g/dL (ref 6.1–8.1)

## 2020-12-27 LAB — HIV-1 RNA QUANT-NO REFLEX-BLD
HIV 1 RNA Quant: NOT DETECTED Copies/mL
HIV-1 RNA Quant, Log: NOT DETECTED Log cps/mL

## 2020-12-30 ENCOUNTER — Other Ambulatory Visit: Payer: Self-pay

## 2020-12-30 ENCOUNTER — Ambulatory Visit (HOSPITAL_COMMUNITY)
Admission: RE | Admit: 2020-12-30 | Discharge: 2020-12-30 | Disposition: A | Discharge status: Home / Self Care | Payer: Medicaid Other | Source: Ambulatory Visit | Attending: Infectious Disease | Admitting: Infectious Disease

## 2020-12-30 DIAGNOSIS — G9389 Other specified disorders of brain: Secondary | ICD-10-CM | POA: Diagnosis not present

## 2020-12-30 DIAGNOSIS — B589 Toxoplasmosis, unspecified: Secondary | ICD-10-CM | POA: Diagnosis not present

## 2020-12-30 IMAGING — MR MR HEAD WO/W CM
13 series · 48 of 48 positions shown · IV contrast (gadavist)
Comparison: MRI of the brain [DATE].

CLINICAL DATA: Toxoplasmosis.

EXAM:
MRI HEAD WITHOUT AND WITH CONTRAST
TECHNIQUE: Multiplanar, multiecho pulse sequences of the brain and surrounding
structures were obtained without and with intravenous contrast.
CONTRAST:  9mL GADAVIST GADOBUTROL 1 MMOL/ML IV SOLN

[Series 5: DWI · axial · 3.0mm · 1.36mm/px · z∈[-69,+71]mm · 6 of 96 slices shown (1 of 2)]
[im 1/96]
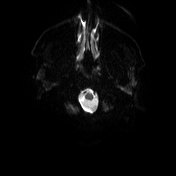
[im 20/96]
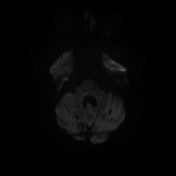
[im 39/96]
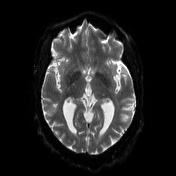
[im 58/96]
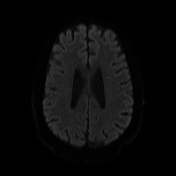
[im 77/96]
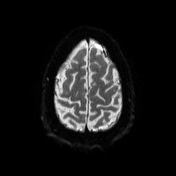
[im 96/96]
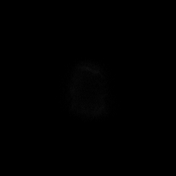

[Series 6: DWI · axial · 3.0mm · 1.36mm/px · z∈[-69,+71]mm · 3 of 48 slices shown (2 of 2)]
[im 1/48]
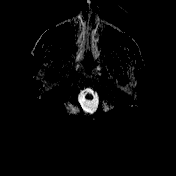
[im 24/48]
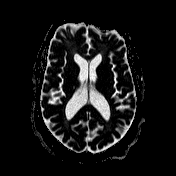
[im 48/48]
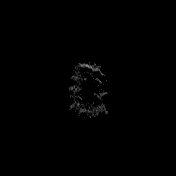

[Series 7: T1 · sagittal · 5.0mm · 0.75mm/px · 1 of 24 slices shown (1 of 2)]
[im 1/24]
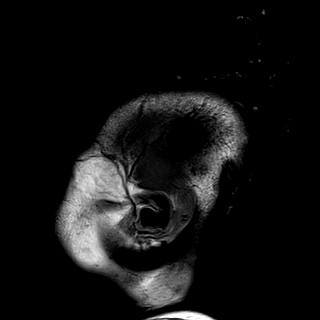

[Series 8: T2 · axial · 5.0mm · 0.62mm/px · z∈[-77,+84]mm · 2 of 26 slices shown]
[im 1/26]
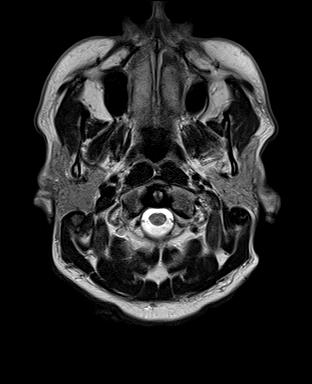
[im 26/26]
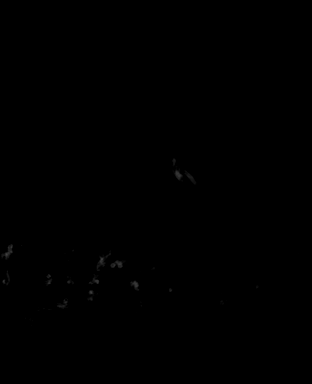

[Series 10: swi_images · axial · 3.0mm · 0.75mm/px · z∈[-102,+109]mm · 4 of 72 slices shown]
[im 1/72]
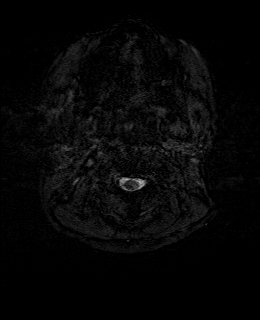
[im 24/72]
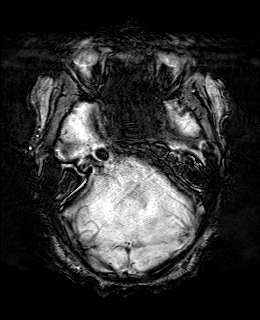
[im 48/72]
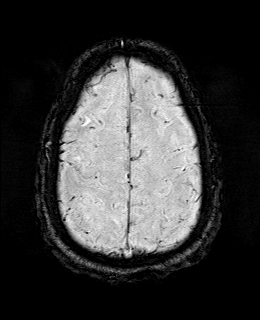
[im 72/72]
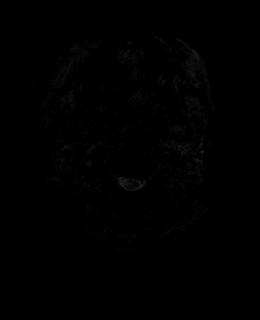

[Series 11: FLAIR · axial · 3.0mm · 0.75mm/px · z∈[-72,+80]mm · 3 of 52 slices shown]
[im 1/52]
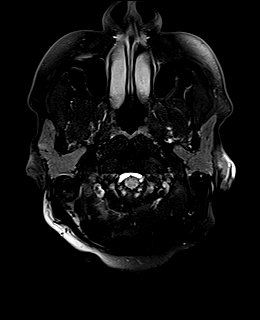
[im 26/52]
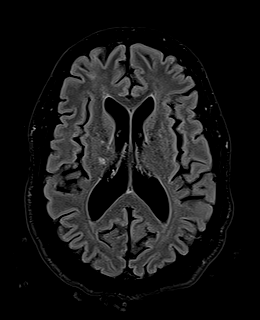
[im 52/52]
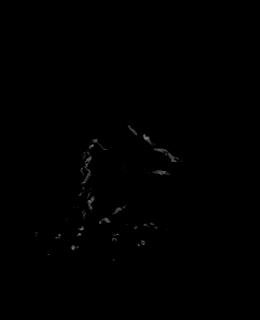

[Series 12: T1 · axial · 1.0mm · 0.94mm/px · z∈[-67,+75]mm · 9 of 144 slices shown (2 of 2)]
[im 1/144]
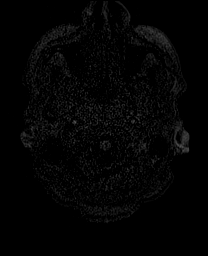
[im 18/144]
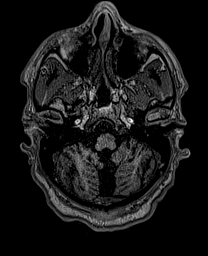
[im 36/144]
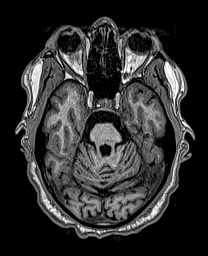
[im 54/144]
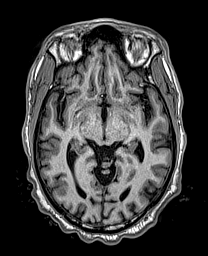
[im 72/144]
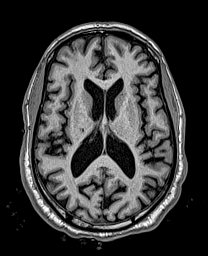
[im 90/144]
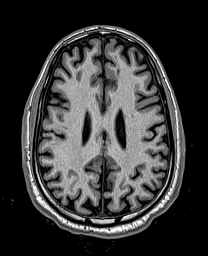
[im 108/144]
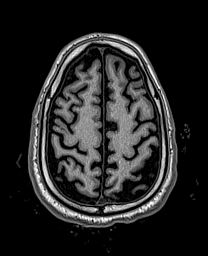
[im 126/144]
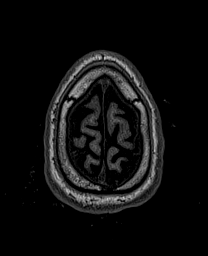
[im 144/144]
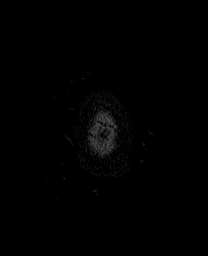

[Series 13: cor dwi_tracew · coronal · 5.0mm · 1.53mm/px · 4 of 60 slices shown]
[im 1/60]
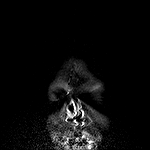
[im 20/60]
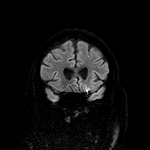
[im 40/60]
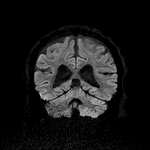
[im 60/60]
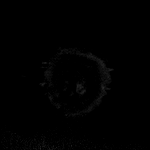

[Series 14: cor dwi_adc · coronal · 5.0mm · 1.53mm/px · 2 of 30 slices shown]
[im 1/30]
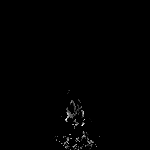
[im 30/30]
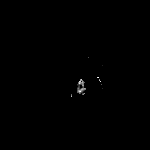

[Series 15: T2 post-contrast · coronal · 5.0mm · 0.57mm/px · 2 of 32 slices shown]
[im 1/32]
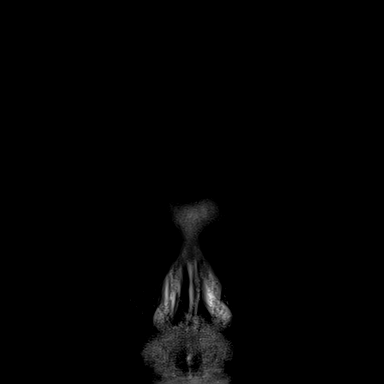
[im 32/32]
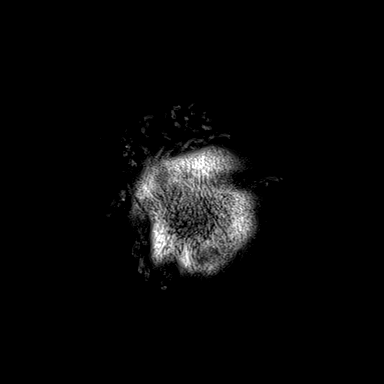

[Series 16: T1 post-contrast · axial · 1.0mm · 0.94mm/px · z∈[-67,+75]mm · 9 of 144 slices shown (1 of 3)]
[im 1/144]
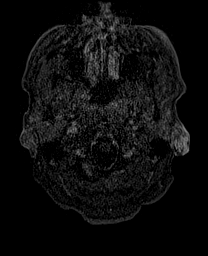
[im 18/144]
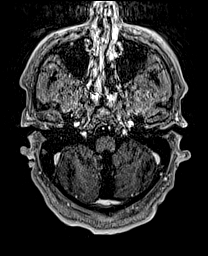
[im 36/144]
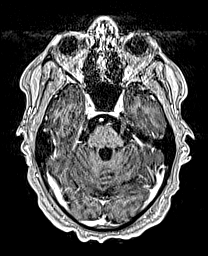
[im 54/144]
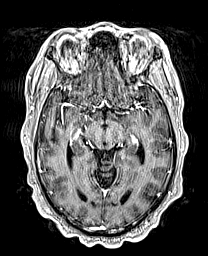
[im 72/144]
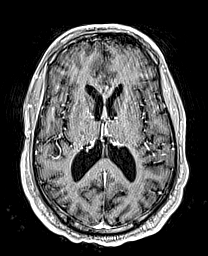
[im 90/144]
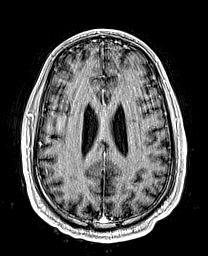
[im 108/144]
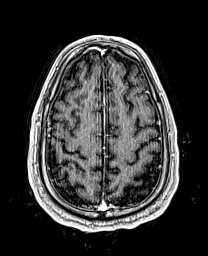
[im 126/144]
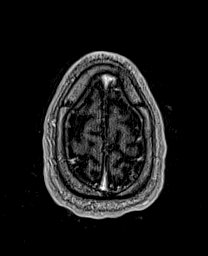
[im 144/144]
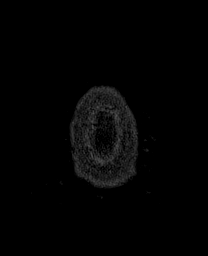

[Series 17: T1 post-contrast · coronal · 5.0mm · 0.43mm/px · 2 of 32 slices shown (2 of 3)]
[im 1/32]
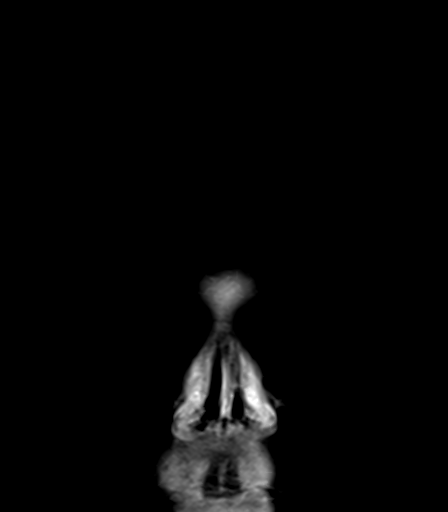
[im 32/32]
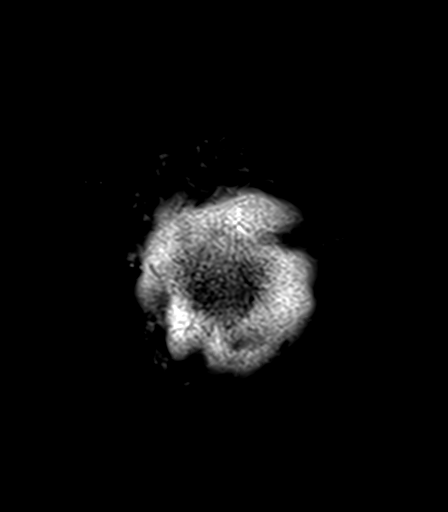

[Series 18: T1 post-contrast · sagittal · 5.0mm · 0.75mm/px · 1 of 24 slices shown (3 of 3)]
[im 1/24]
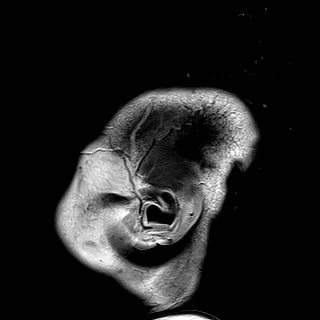

[48 of 48 positions shown; findings below may reference images not displayed]

FINDINGS: The study is partially degraded by motion.

Brain: No acute infarction, hemorrhage, hydrocephalus, extra-axial
collection or mass lesion.

Multiple scattered foci of T2 hyperintense within the bilateral
cerebral and cerebellar, basal ganglia and brainstem without
evidence of contrast enhancement are decreased in number and size.
T2 hyperintense lesion in the periventricular white matter adjacent
to the left frontal horn is also decreased in size but with mild
residual contrast enhancement. No new focus of contrast enhancement.
No meningeal contrast enhancement.

Vascular: Normal flow voids.

Skull and upper cervical spine: Normal marrow signal.

Sinuses/Orbits: Negative.
IMPRESSION: Interval improvement with decrease in size and number of the T2
hyperintense lesion scattered throughout the brain concerning for
opportunistic infection. One left frontal lesion has mild residual
contrast enhancement.

## 2020-12-30 MED ORDER — GADOBUTROL 1 MMOL/ML IV SOLN
9.0000 mL | Freq: Once | INTRAVENOUS | Status: AC | PRN
  Administered 2020-12-30: 9 mL via INTRAVENOUS

## 2021-01-26 ENCOUNTER — Ambulatory Visit (INDEPENDENT_AMBULATORY_CARE_PROVIDER_SITE_OTHER): Payer: Medicaid Other

## 2021-01-26 ENCOUNTER — Other Ambulatory Visit: Payer: Self-pay

## 2021-01-26 ENCOUNTER — Other Ambulatory Visit (HOSPITAL_COMMUNITY)
Admission: RE | Admit: 2021-01-26 | Discharge: 2021-01-26 | Disposition: A | Discharge status: Home / Self Care | Payer: Medicaid Other | Source: Ambulatory Visit | Attending: Infectious Disease | Admitting: Infectious Disease

## 2021-01-26 ENCOUNTER — Encounter: Payer: Self-pay | Admitting: Infectious Disease

## 2021-01-26 ENCOUNTER — Ambulatory Visit (INDEPENDENT_AMBULATORY_CARE_PROVIDER_SITE_OTHER): Payer: Medicaid Other | Admitting: Infectious Disease

## 2021-01-26 VITALS — BP 116/81 | HR 84 | Temp 98.0°F | Ht 71.0 in | Wt 198.0 lb

## 2021-01-26 DIAGNOSIS — B582 Toxoplasma meningoencephalitis: Secondary | ICD-10-CM

## 2021-01-26 DIAGNOSIS — F0280 Dementia in other diseases classified elsewhere without behavioral disturbance: Secondary | ICD-10-CM

## 2021-01-26 DIAGNOSIS — B20 Human immunodeficiency virus [HIV] disease: Secondary | ICD-10-CM | POA: Diagnosis not present

## 2021-01-26 DIAGNOSIS — R9089 Other abnormal findings on diagnostic imaging of central nervous system: Secondary | ICD-10-CM

## 2021-01-26 DIAGNOSIS — Z23 Encounter for immunization: Secondary | ICD-10-CM

## 2021-01-26 DIAGNOSIS — G4700 Insomnia, unspecified: Secondary | ICD-10-CM | POA: Diagnosis not present

## 2021-01-26 DIAGNOSIS — R519 Headache, unspecified: Secondary | ICD-10-CM | POA: Diagnosis not present

## 2021-01-26 DIAGNOSIS — A523 Neurosyphilis, unspecified: Secondary | ICD-10-CM

## 2021-01-26 DIAGNOSIS — G8929 Other chronic pain: Secondary | ICD-10-CM | POA: Diagnosis not present

## 2021-01-26 HISTORY — DX: Insomnia, unspecified: G47.00

## 2021-01-26 NOTE — Progress Notes (Signed)
Chief concern: Continued problems with headaches and insomnia also some memory problems Subjective:    Patient ID: Arthur [name], male    DOB: 1981-05-25, 40 y.o.   MRN: [id number]  HPI   Arthur [name] is a 40 year-old African-American man living with HIV who  fell out of care and was not seen by me since 2015.  He previously failed Atripla with resistance and IK 103 when he is being seen by Dr. Philipp Deputy.  I changed him to a protease-based regimen with Prezista Norvir and Truvada but he had not been seen since 2015.  He was admitted to the hospital with profound encephalopathy.  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  He was also broadly treated for other possible CNS pathology including CMV encephalitis with ganciclovir RI PE for possible tuberculosis with steroids.  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  He was ultimately discharged from the inpatient hospital service to inpatient rehab and completed therapy while there.  Profound dysphagia and nutritional deficiency he was given a PEG tube through which she was getting his medications along with nutrition.  He is living at home with his mother and also his sister who is a CMA and have been overseeing his medications.  He has had dramatic improvement in his neurological status and maintained perfect virological control. We retreated him for Neurosyphilis when his titers went up. He had low hgb while on PCN but never verified this prior to completion.  His CD4 is slow to come up.  I added Rukobia in July in hopes to help increase his CD4 count but he did not tolerate this medication with nausea and vomiting.  He has remained suppressed.  I saw Arthur [name] in March 2022  .  He has been having headaches in the left side of his head sometimes wake him up at night.  He rates them as being about a 7 out of 10 in severity and they are also of  a stabbing type of nature.  They do respond to Tylenol and typically resolve within 40 minutes.  Sometimes though they do keep him up at night.  He was also having trouble with insomnia in general despite switching BIKTARVY to morning time doses.  The hydroxyzine seen does help him sleep but he does not wheeze take it.  His mother was discouraging of taking every night because she was concerned he might become addicted to it.  He was supposed to get labs that day but did not.  He comes back to clinic again with his mother today.  Is continue to have headaches that are not responding to Imitrex that I prescribed him.  I think he would clearly benefit from being seen by a neurologist who can help him with managing his headaches and also to do a more thorough evaluation of his cognitive deficits.    Past Medical History:  Diagnosis Date  . Anemia   . Anxiety   . Depression   . GERD (gastroesophageal reflux disease)   . HIV infection (HCC)   . Substance abuse [facility])     Past Surgical History:  Procedure Laterality Date  . IR GASTROSTOMY TUBE MOD SED  09/02/2019  . IR GASTROSTOMY TUBE REMOVAL  10/28/2019  . TOOTH EXTRACTION      Family History  Problem Relation Age of Onset  . Diabetes Neg Hx   . CAD Neg Hx   . Hypertension  Neg Hx       Social History   Socioeconomic History  . Marital status: Single    Spouse name: Not on file  . Number of children: Not on file  . Years of education: Not on file  . Highest education level: Not on file  Occupational History  . Not on file  Tobacco Use  . Smoking status: Current Every Day Smoker    Packs/day: 0.30    Years: 17.00    Pack years: 5.10    Types: Cigarettes  . Smokeless tobacco: Never Used  . Tobacco comment: encouraged to quit  Vaping Use  . Vaping Use: Never used  Substance and Sexual Activity  . Alcohol use: No    Comment: occassionally  . Drug use: Not Currently    Types: Marijuana  . Sexual activity: Not  Currently    Partners: Male    Comment: declined condoms 09/15/20  Other Topics Concern  . Not on file  Social History Narrative  . Not on file   Social Determinants of Health   Financial Resource Strain: Not on file  Food Insecurity: Not on file  Transportation Needs: Not on file  Physical Activity: Not on file  Stress: Not on file  Social Connections: Not on file    No Known Allergies   Current Outpatient Medications:  .  acetaminophen (TYLENOL) 500 MG tablet, Take 500 mg by mouth every 6 (six) hours as needed., Disp: , Rfl:  .  atorvastatin (LIPITOR) 20 MG tablet, Take 1 tablet (20 mg total) by mouth daily., Disp: 90 tablet, Rfl: 3 .  bictegravir-emtricitabine-tenofovir AF (BIKTARVY) 50-200-25 MG TABS tablet, Take 1 tablet by mouth daily., Disp: 30 tablet, Rfl: 11 .  citalopram (CELEXA) 20 MG tablet, Take 1 tablet (20 mg total) by mouth daily., Disp: 30 tablet, Rfl: 3 .  famotidine (PEPCID) 20 MG tablet, Take 1 tablet (20 mg total) by mouth at bedtime., Disp: 30 tablet, Rfl: 11 .  hydrOXYzine (ATARAX/VISTARIL) 25 MG tablet, Take 1 tablet (25 mg total) by mouth at bedtime as needed (insomnia)., Disp: 30 tablet, Rfl: 11 .  leucovorin (WELLCOVORIN) 25 MG tablet, Take 1 tablet (25 mg total) by mouth daily., Disp: 30 tablet, Rfl: 5 .  Menthol-Methyl Salicylate (MUSCLE RUB) 10-15 % CREA, Apply 1 application topically as needed for muscle pain., Disp:  , Rfl: 0 .  ondansetron (ZOFRAN) 4 MG tablet, Take 1 tablet (4 mg total) by mouth every 6 (six) hours as needed for nausea., Disp: 20 tablet, Rfl: 0 .  pyrimethamine (DARAPRIM) 25 MG tablet, Take 2 tablets (50 mg total) by mouth daily with breakfast., Disp: 60 tablet, Rfl: 5 .  sulfaDIAZINE 500 MG tablet, Take 2 tablets (1,000 mg total) by mouth 2 (two) times daily., Disp: 120 tablet, Rfl: 5 .  sulfamethoxazole-trimethoprim (BACTRIM DS) 800-160 MG tablet, TAKE 1 TABLET BY MOUTH 3 TIMES A WEEK, Disp: 12 tablet, Rfl: 5 .  SUMAtriptan  (IMITREX) 50 MG tablet, Take 1 tablet (50 mg total) by mouth every 2 (two) hours as needed for migraine. May repeat in 2 hours if headache persists or recurs., Disp: 10 tablet, Rfl: 0 .  terbinafine (LAMISIL) 1 % cream, Apply topically daily. Apply to feet daily, Disp: 30 g, Rfl: 0   Review of Systems  Unable to perform ROS: Mental status change  Neurological: Positive for headaches.  Psychiatric/Behavioral: Positive for sleep disturbance.       Objective:   Physical Exam Constitutional:  Appearance: He is overweight.  HENT:     Head: Normocephalic.     Nose: Nose normal.  Eyes:     Extraocular Movements: Extraocular movements intact.  Cardiovascular:     Rate and Rhythm: Normal rate and regular rhythm.  Pulmonary:     Effort: Pulmonary effort is normal. No respiratory distress.  Neurological:     General: No focal deficit present.     Mental Status: He is alert and oriented to person, place, and time.  Psychiatric:        Attention and Perception: Attention normal.        Mood and Affect: Affect normal.        Speech: Speech is delayed.        Behavior: Behavior is cooperative.        Cognition and Memory: Cognition is impaired. Memory is impaired.          Assessment & Plan:   Headaches: We obtained an MRI because I had some concern about CNS pathology given his toxoplasmosis but was quite reassuring.  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  He did not want to try Rukobia  Bactrim for PCP prevention   Neurosyphilis: His completed SECOND  treatment with intravenous penicillin and IM PCN   HIV associated neurological disorder: Supportive therapy is good he is with his mother I also think it is reasonable to get him reengage with physical medicine rehabilitation and I am also referring him  to neurology  Insomnia: He can continue to take the hydroxyzine but can take it every night it is not an addictive compound.  Could consider using on he also BIKTARVY better really do not want to mess with his baseline antiretroviral regimen  COVID-19 prevention he got  Pf[employer] vaccine today. If CD4 remains low would vote to get him evushield  I spent greater than 40 minutes with the patient including greater than 50% of time in face to face counsel of the patient and his mother regarding his HIV CNS toxoplasmosis is HIV associated neurological disorder insomnia headaches COVID-19 prevention and in coordination of his care.

## 2021-01-26 NOTE — Progress Notes (Signed)
   Covid-19 Vaccination Clinic  Name:  CAVON NICOLLS    MRN: [id number] DOB: 01-23-1981  01/26/2021  Mr. Vastine was observed post Covid-19 immunization for 15 minutes without incident. He was provided with Vaccine Information Sheet and instruction to access the V-Safe system.   Mr. Finch was instructed to call 911 with any severe reactions post vaccine: [name] Difficulty breathing  . Swelling of face and throat  . A fast heartbeat  . A bad rash all over body  . Dizziness and weakness       Rosanna Randy, RN

## 2021-01-27 LAB — T-HELPER CELL (CD4) - (RCID CLINIC ONLY)
CD4 % Helper T Cell: 7 % — ABNORMAL LOW (ref 33–65)
CD4 T Cell Abs: 57 /uL — ABNORMAL LOW (ref 400–1790)

## 2021-01-27 LAB — URINE CYTOLOGY ANCILLARY ONLY
Chlamydia: NEGATIVE
Comment: NEGATIVE
Comment: NORMAL
Neisseria Gonorrhea: NEGATIVE

## 2021-01-28 LAB — COMPLETE METABOLIC PANEL WITH GFR
AG Ratio: 1.3 (calc) (ref 1.0–2.5)
ALT: 12 U/L (ref 9–46)
AST: 17 U/L (ref 10–40)
Albumin: 4.7 g/dL (ref 3.6–5.1)
Alkaline phosphatase (APISO): 67 U/L (ref 36–130)
BUN: 13 mg/dL (ref 7–25)
CO2: 24 mmol/L (ref 20–32)
Calcium: 9.6 mg/dL (ref 8.6–10.3)
Chloride: 103 mmol/L (ref 98–110)
Creat: 1.25 mg/dL (ref 0.60–1.35)
GFR, Est African American: 85 mL/min/{1.73_m2} (ref >=60)
GFR, Est Non African American: 73 mL/min/{1.73_m2} (ref >=60)
Globulin: 3.7 g/dL (calc) (ref 1.9–3.7)
Glucose, Bld: 88 mg/dL (ref 65–99)
Potassium: 4.6 mmol/L (ref 3.5–5.3)
Sodium: 135 mmol/L (ref 135–146)
Total Bilirubin: 0.3 mg/dL (ref 0.2–1.2)
Total Protein: 8.4 g/dL — ABNORMAL HIGH (ref 6.1–8.1)

## 2021-01-28 LAB — RPR TITER: RPR Titer: 1:32 {titer} — ABNORMAL HIGH

## 2021-01-28 LAB — CBC WITH DIFFERENTIAL/PLATELET
Absolute Monocytes: 459 cells/uL (ref 200–950)
Basophils Absolute: 31 cells/uL (ref 0–200)
Basophils Relative: 1 %
Eosinophils Absolute: 264 cells/uL (ref 15–500)
Eosinophils Relative: 8.5 %
HCT: 44.7 % (ref 38.5–50.0)
Hemoglobin: 15.1 g/dL (ref 13.2–17.1)
Lymphs Abs: 964 cells/uL (ref 850–3900)
MCH: 31.5 pg (ref 27.0–33.0)
MCHC: 33.8 g/dL (ref 32.0–36.0)
MCV: 93.3 fL (ref 80.0–100.0)
MPV: 9.3 fL (ref 7.5–12.5)
Monocytes Relative: 14.8 %
Neutro Abs: 1383 cells/uL — ABNORMAL LOW (ref 1500–7800)
Neutrophils Relative %: 44.6 %
Platelets: 322 10*3/uL (ref 140–400)
RBC: 4.79 10*6/uL (ref 4.20–5.80)
RDW: 13.3 % (ref 11.0–15.0)
Total Lymphocyte: 31.1 %
WBC: 3.1 10*3/uL — ABNORMAL LOW (ref 3.8–10.8)

## 2021-01-28 LAB — HIV-1 RNA QUANT-NO REFLEX-BLD
HIV 1 RNA Quant: <20 Copies/mL — ABNORMAL HIGH
HIV-1 RNA Quant, Log: <1.3 Log cps/mL — ABNORMAL HIGH

## 2021-01-28 LAB — FLUORESCENT TREPONEMAL AB(FTA)-IGG-BLD: Fluorescent Treponemal ABS: REACTIVE — AB

## 2021-01-28 LAB — RPR: RPR Ser Ql: REACTIVE — AB

## 2021-01-31 DIAGNOSIS — H5213 Myopia, bilateral: Secondary | ICD-10-CM | POA: Diagnosis not present

## 2021-02-04 ENCOUNTER — Ambulatory Visit: Payer: Medicaid Other | Admitting: Neurology

## 2021-02-04 ENCOUNTER — Encounter: Payer: Self-pay | Admitting: Neurology

## 2021-02-04 VITALS — BP 115/84 | HR 94 | Ht 71.0 in | Wt 199.0 lb

## 2021-02-04 DIAGNOSIS — A523 Neurosyphilis, unspecified: Secondary | ICD-10-CM

## 2021-02-04 DIAGNOSIS — G4489 Other headache syndrome: Secondary | ICD-10-CM

## 2021-02-04 DIAGNOSIS — R413 Other amnesia: Secondary | ICD-10-CM | POA: Diagnosis not present

## 2021-02-04 DIAGNOSIS — B20 Human immunodeficiency virus [HIV] disease: Secondary | ICD-10-CM | POA: Diagnosis not present

## 2021-02-04 HISTORY — DX: Other headache syndrome: G44.89

## 2021-02-04 MED ORDER — DIVALPROEX SODIUM 250 MG PO DR TAB: DELAYED_RELEASE_TABLET | ORAL | 3 refills | Status: DC

## 2021-02-04 NOTE — Progress Notes (Signed)
Reason for visit: Memory disturbance, headache  Referring physician: Dr. [name]  HRISTOPHER [name] is a 40 y.o. male  History of present illness:  Arthur [name] is a 40 year old right-handed black male with a history of HIV infection.  The patient was admitted to the hospital around 28 July 2019 with severe lethargy and fever.  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  The patient was aggressively treated for these issues.  The patient comes in the office today with his sister who claims that within 2 weeks prior to that hospitalization, he was cognitively normal, ever since the hospitalization he has had significant cognitive impairment and gait instability.  The patient has gradually improved with his ability to walk and get around, he uses a cane.  His cognitive abilities have not improved.  His mentation has not worsened however.  The patient has difficulty with short-term memory, he denies any word finding problems, he will have trouble remembering names for people at times.  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  The patient requires assistance keeping up with medications and appointments, his mother does the finances.  The patient was working in [employer] business before the hospitalization in 2020, but he has not been able to work since that time.  The patient denies issues controlling the bowels or the bladder.  He has some weakness sensations in the legs.  He reports no numbness of the extremities.  He does report frequent headaches, he is having least 4 headaches a week lasting at least a half an hour.  He takes Tylenol for the headache which seems to help, he was given a prescription for Imitrex but this was not as helpful.  The patient does report photophobia and phonophobia, he denies any nausea or vomiting.  His sister has a history of migraine.  He was not having headaches prior  to the November 2020 hospitalization.  He recently had MRI of the brain that shows ongoing improvement of the toxoplasmosis infection.  His viral counts for HIV are low.  He is sent to this office for further evaluation.  Past Medical History:  Diagnosis Date  . Anemia   . Anxiety   . Depression   . GERD (gastroesophageal reflux disease)   . Headache   . HIV infection (HCC)   . Insomnia 01/26/2021  . Substance abuse [facility])     Past Surgical History:  Procedure Laterality Date  . IR GASTROSTOMY TUBE MOD SED  09/02/2019  . IR GASTROSTOMY TUBE REMOVAL  10/28/2019  . TOOTH EXTRACTION      Family History  Problem Relation Age of Onset  . Diabetes Neg Hx   . CAD Neg Hx   . Hypertension Neg Hx     Social history:  reports that he has been smoking cigarettes. He has a 5.10 pack-year smoking history. He has never used smokeless tobacco. He reports previous drug use. Drug: Marijuana. He reports that he does not drink alcohol.  Medications:  Prior to Admission medications   Medication Sig Start Date End Date Taking? Authorizing Provider  acetaminophen (TYLENOL) 500 MG tablet Take 500 mg by mouth every 6 (six) hours as needed.   Yes [provider]  atorvastatin (LIPITOR) 20 MG tablet Take 1 tablet (20 mg total) by mouth daily. 11/21/19  Yes Claiborne Rigg, NP  bictegravir-emtricitabine-tenofovir AF (BIKTARVY) 50-200-25 MG TABS tablet Take 1 tablet by  mouth daily. 12/23/20  Yes [name], [name], MD  citalopram (CELEXA) 20 MG tablet Take 1 tablet (20 mg total) by mouth daily. 02/16/20  Yes Claiborne Rigg, NP  famotidine (PEPCID) 20 MG tablet Take 1 tablet (20 mg total) by mouth at bedtime. 09/30/19  Yes [name], [name], MD  hydrOXYzine (ATARAX/VISTARIL) 25 MG tablet Take 1 tablet (25 mg total) by mouth at bedtime as needed (insomnia). 12/23/20  Yes [name], [name], MD  leucovorin (WELLCOVORIN) 25 MG tablet Take 1 tablet (25 mg total) by mouth daily. 12/23/20  Yes [name],  [name], MD  Menthol-Methyl Salicylate (MUSCLE RUB) 10-15 % CREA Apply 1 application topically as needed for muscle pain. 09/26/19  Yes Love, Evlyn Kanner, PA-C  ondansetron (ZOFRAN) 4 MG tablet Take 1 tablet (4 mg total) by mouth every 6 (six) hours as needed for nausea. 09/26/19  Yes Love, Evlyn Kanner, PA-C  pyrimethamine (DARAPRIM) 25 MG tablet Take 2 tablets (50 mg total) by mouth daily with breakfast. 12/23/20  Yes [name], [name], MD  sulfaDIAZINE 500 MG tablet Take 2 tablets (1,000 mg total) by mouth 2 (two) times daily. 12/23/20  Yes [name], [name], MD  sulfamethoxazole-trimethoprim (BACTRIM DS) 800-160 MG tablet TAKE 1 TABLET BY MOUTH 3 TIMES A WEEK 12/23/20  Yes [name], [name], MD  SUMAtriptan (IMITREX) 50 MG tablet Take 1 tablet (50 mg total) by mouth every 2 (two) hours as needed for migraine. May repeat in 2 hours if headache persists or recurs. 12/23/20  Yes [name], [name], MD  terbinafine (LAMISIL) 1 % cream Apply topically daily. Apply to feet daily 09/26/19  Yes Love, Evlyn Kanner, PA-C     No Known Allergies  ROS:  Out of a complete 14 system review of symptoms, the patient complains only of the following symptoms, and all other reviewed systems are negative.  Memory problems Blindness in the right eye Walking difficulty Headache  Blood pressure 115/84, pulse 94, height 5\' 11"  (1.803 m), weight 199 lb (90.3 kg).  Physical Exam  General: The patient is alert and cooperative at the time of the examination.  Eyes: Pupils are equal, round, and reactive to light. APD is noted on the right.  Neck: The neck is supple, no carotid bruits are noted.  Respiratory: The respiratory examination is clear.  Cardiovascular: The cardiovascular examination reveals a regular rate and rhythm, no obvious murmurs or rubs are noted.  Skin: Extremities are without significant edema.  Neurologic Exam  Mental status: The patient is alert and oriented x 3 at the time of the  examination. The Mini-Mental status examination done today shows a total score 24/30.  Cranial nerves: Facial symmetry is present. There is good sensation of the face to pinprick and soft touch bilaterally. The strength of the facial muscles and the muscles to head turning and shoulder shrug are normal bilaterally. Speech is well enunciated, no aphasia or dysarthria is noted. Extraocular movements are full, with primary gaze, there is exotropia of the right eye. Visual fields are full with the left eye, however when opening both eyes, the patient demonstrates a right homonymous visual field deficit not present when the right eye is covered. The tongue is midline, and the patient has symmetric elevation of the soft palate. No obvious hearing deficits are noted.  Motor: The motor testing reveals 5 over 5 strength of all 4 extremities. Good symmetric motor tone is noted throughout.  Sensory: Sensory testing  is intact to pinprick, soft touch, vibration sensation, and position sense on all 4 extremities. No evidence of extinction is noted.  Coordination: Cerebellar testing reveals good finger-nose-finger bilaterally.  The patient has dysmetria with heel-to-shin bilaterally.  Gait and station: Gait is slightly wide-based, the patient can walk independently but usually uses a cane.  Tandem gait is slightly unsteady.  Romberg is negative.  Reflexes: Deep tendon reflexes are symmetric and normal bilaterally. Toes are downgoing bilaterally.   MRI brain 12/31/20:  IMPRESSION: Interval improvement with decrease in size and number of the T2 hyperintense lesion scattered throughout the brain concerning for opportunistic infection. One left frontal lesion has mild residual contrast enhancement.  * MRI scan images were reviewed online. I agree with the written report.    Assessment/Plan:  1.  HIV infection  2.  Cerebral toxoplasmosis infection  3.  Neurosyphilis  4.  Headache, possible  migraine  5.  Static encephalopathy  The patient has sustained permanent cognitive changes following the treatment of cerebral toxoplasmosis and neurosyphilis.  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  The patient will be sent for formal neuropsychological testing.  He will be started on Depakote for the headache, working up on the dose.  He will follow-up here in 3 months.  The patient demonstrates some inappropriate laughter during the exam, may have a pseudobulbar affect.  Marlan Palau MD 02/04/2021 10:32 AM  [facility] [address] [place], [place] [address]  Phone [phone] Fax [phone]

## 2021-02-09 ENCOUNTER — Ambulatory Visit: Payer: Medicaid Other

## 2021-02-09 ENCOUNTER — Other Ambulatory Visit: Payer: Self-pay

## 2021-02-09 ENCOUNTER — Encounter: Payer: Self-pay | Admitting: Counselor

## 2021-02-09 ENCOUNTER — Ambulatory Visit (INDEPENDENT_AMBULATORY_CARE_PROVIDER_SITE_OTHER): Payer: Medicaid Other | Admitting: Counselor

## 2021-02-09 DIAGNOSIS — A523 Neurosyphilis, unspecified: Secondary | ICD-10-CM

## 2021-02-09 DIAGNOSIS — F0280 Dementia in other diseases classified elsewhere without behavioral disturbance: Secondary | ICD-10-CM | POA: Diagnosis not present

## 2021-02-09 DIAGNOSIS — B582 Toxoplasma meningoencephalitis: Secondary | ICD-10-CM

## 2021-02-09 DIAGNOSIS — F09 Unspecified mental disorder due to known physiological condition: Secondary | ICD-10-CM

## 2021-02-09 DIAGNOSIS — B20 Human immunodeficiency virus [HIV] disease: Secondary | ICD-10-CM

## 2021-02-09 NOTE — Progress Notes (Signed)
   Psychometrist Note   Cognitive testing was administered to Arthur [name] by Lamar Benes, B.S. (Technician) under the supervision of Alphonzo Severance, Psy.D., ABN. [name] was able to tolerate all test procedures. Dr. Nicole Kindred met with the patient as needed to manage any emotional reactions to the testing procedures. Rest breaks were offered.    The battery of tests administered was selected by Dr. Nicole Kindred with consideration to the patient's current level of functioning, the nature of his symptoms, emotional and behavioral responses during the interview, level of literacy, observed level of motivation/effort, and the nature of the referral question. This battery was communicated to the psychometrist. Communication between Dr. Nicole Kindred and the psychometrist was ongoing throughout the evaluation and Dr. Nicole Kindred was immediately accessible at all times. Dr. Nicole Kindred provided supervision to the technician on the date of this service, to the extent necessary to assure the quality of all services provided.    Arthur [name] will return in approximately one week for an interactive feedback session with Dr. Nicole Kindred, at which time test performance, clinical impressions, and treatment recommendations will be reviewed in detail. The patient understands he can contact our office should he require our assistance before this time.   A total of 180 minutes of billable time were spent with Arthur [name] by the technician, including test administration and scoring time. Billing for these services is reflected in Dr. Les Pou note.   This note reflects time spent with the psychometrician and does not include test scores, clinical history, or any interpretations made by Dr. Nicole Kindred. The full report will follow in a separate note.
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
## 2021-02-09 NOTE — Progress Notes (Signed)
NEUROPSYCHOLOGICAL TEST SCORES  Neurology  Patient Name: Arthur [name] MRN: [id number] Date of Birth: 08-08-1981 Age: 40 y.o. Education: 12 years (special education)  Measurement properties of test scores: IQ, Index, and Standard Scores (SS): Mean = 100; Standard Deviation = 15 Scaled Scores (Ss): Mean = 10; Standard Deviation = 3 Z scores (Z): Mean = 0; Standard Deviation = 1 T scores (T); Mean = 50; Standard Deviation = 10  TEST SCORES:    Note: This summary of test scores accompanies the interpretive report and should not be interpreted by unqualified individuals or in isolation without reference to the report. Test scores are relative to age, gender, and educational history as available and appropriate.   Performance Validity        TOMM Raw Descriptor      Trial 1 15 Below Expectation      Trial 2 12 Below Expectation      Retention 29 Below Expectation      The Dot Counting Test: Raw Descriptor      E-Score 22 Below Expectation      Embedded Measures: Raw Descriptor      RBANS Effort Index: 6 Below Expectation      WAIS-IV Reliable Digit Span 6 Below Expectation      WAIS-IV Reliable Digit Span Revised 10 Below Expectation      Mental Status Screening     Total Score Descriptor  MoCA 19 Mild Dementia      Expected Functioning        Wide Range Achievement Test (Word Reading): Standard/Scaled Score Percentile       Word Reading 84 14      Reynolds Intellectual Screening Test Standard/T-score Percentile      Guess What 30 2      Odd Item Out 38 12  RIST Index 79 8      Cognitive Testing        RBANS, Form : Standard/Scaled Score Percentile  Total Score 49 <1  Immediate Memory 49 <1      List Learning 2 <1      Story Memory 2 <1  Visuospatial/Constructional 69 2      Figure Copy   (17) 4 2      Judgment of Line Orientation   (13) --- 10-16  Language 74 4      Picture Naming --- 17-25      Semantic Fluency 1 <1  Attention 43 <1      Digit  Span 2 <1      Coding 1 <1  Delayed Memory 44 <1      List Recall   (1) --- <2      List Recognition   (13) --- <2      Story Recall   (6) 5 5      Figure Recall   (9) 3 1      Wechsler Adult Intelligence Scale - IV: Standard/Scaled Score Percentile  Working Memory Index 71 3      Digit Span 5 5          Digit Span Forward 5 5          Digit Span Backward 6 9          Digit Span Sequencing 7 16      Arithmetic 5 5  Processing Speed Index 56 <1      Symbol Search 2 <1      Coding 2 <1  Neuropsychological Assessment Battery (Language Module): T-score Percentile      Naming   (27) 30 2      Verbal Fluency: T-score Percentile      Controlled Oral Word Association (F-A-S) 19 <1      Semantic Fluency (Animals) 12 <1      Trail Making Test: T-Score Percentile      Part A 19 <1      Part B 37 9      Modified [place] Card Sorting Test (MWCST): Standard/T-Score Percentile      Number of Categories Correct 19 <1      Number of Perseverative Errors 22 <1      Number of Total Errors 20 <1      Percent Perseverative Errors 35 7  Executive Function Composite 54 <1      Boston Diagnostic Aphasia Exam: Raw Score Scaled Score      Complex Ideational Material 11 9      Clock Drawing Raw Score Descriptor      Command 6 Mild Impairment      Rating Scales         Raw Score Descriptor  Patient Health Questionnaire - 9 5 Mild  GAD-7 9 Mild       Raw Score Descriptor  Functional Activities Questionnaire 22 Positive   Meriah Shands V. Roseanne Reno PsyD, ABN Clinical Neuropsychologist

## 2021-02-11 ENCOUNTER — Telehealth: Payer: Self-pay

## 2021-02-11 NOTE — Progress Notes (Signed)
[facility]  Patient Name: Arthur [name] MRN: [id number] Date of Birth: 1981-05-19 Age: 40 y.o. 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 Since then, he has had significant cognitive difficulties and is living with family, who also manage his medications, help with finances, and most other instrumental activities of daily living (he is independent for basic activities). He was referred by Dr. Jannifer Franklin at [facility] who was concerned about permanent cognitive changes due to the above noted etiologies and also a component of HAND. His most recent MRI continues to demonstrate multiple signal abnormalities, including multifocal T2/FLAIR changes (mainly infratentorial and in the brainstem) with additional foci in the bilateral cerebral white matter and some minimal areas of possible cortical involvement in the right frontal lobe. One of the areas of abnormal signal in the left frontal periventricular region had some low-level abnormal contrast enhancement. These findings are essentially all improved to some extent since his previous imaging in 2020.   Cognitive test data is deemed invalid and uninterpretable on the basis of multiple standalone and embedded validity indicators falling well below the level of expectations. While performance these indicators can at times be undermined by the presence of very significant cognitive impairment, his scores on a forced choice memory measure fell below chance levels and are suspiciously low even as compared to individuals with Alzheimer's dementia. On overall ability testing, there was evidence of low average word reading and overall prior ability  at the margin of the low average/unusually low range. He reported minimal levels of depressive symptoms on a self-rating scale and mild levels of anxiety symptoms. His sister characterized him as having significant functional difficulties on the Functional Activities Questionnaire, dependent in several areas and requiring assistance in others.   Neuropsychological test findings are thus uninformative as to the patient's current cognitive status. There is suggestion of preexisting weaknesses that temper expectations for mental status and cognitive test performance on overall ability measures, consistent with his history of learning disabilities. Despite performance validity problems, the presentation is viewed as consistent with a likely major neurocognitive disorder level problem given his clinical history. Concur with Dr. Jannifer Franklin that some combination of neuro toxoplasmosis, neurosyphilis and perhaps also HAND are explanatory. He is already following with ID who are managing his antiretroviral medications. He may benefit from further involvement with PM & R and I believe he has already been referred by Dr. Tommy Medal. May also benefit from increasing his involvement in cognitively stimulating activities, such as reading, writing (patient used to write poetry), and socially stimulating activities. I do not think he likely has a separate psychiatric disorder and instead suspect his anxiety is secondary to his neurocognitive problems, he is probably not a candidate for therapy given the extent of his cognitive impairments.   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 Just like a patient must lie still when undergoing an Xray for the picture to be interpretable, there are a set of assumptions that must be met for  neuropsychological test findings to be of meaning. Your performance on validity measures suggests that we cannot be entirely sure  that was the case. Validity problems can be caused by any number of things such as fatigue, difficulty maintaining attention, sensory impairments, idiosyncratic approaches to test taking, or misunderstanding test instructions. Regardless of the cause, suboptimal performance validity means that we cannot have much faith in the reliability of the test data as indicators of your true level of cognitive functioning.   My sense is that you likely have a major neurocognitive disorder level problem. This term is equivalent to the term "dementia," but is often used to distinguish causes of cognitive impairment that are not age-related or necessarily progressive from things such as Alzheimer's disease. In your case, your cognitive problems are likely the result of several factors. You had cerebral toxoplasmosis, particularly affecting the brainstem and cerebellum in addition to other areas, which would be expected to cause significant cognitive problems. You also were found to have neurosyphilis, which can cause significant cognitive problems if not treated promptly. Finally, HIV and AIDS can cause HIV associated neurocognitive disorder, particularly when not treated with appropriate therapy, and there may be some component of that.   In terms of your current limitations, I am unable to provide definitive guidance as to your driving safety, ability to live independently, manage money, or the like because your test data are invalid. I can tell you that individuals with major neurocognitive disorder required assistance with at least one major life activity (e.g., managing finances, being unable to work) and often times multiple areas. If you do attempt to reengage in any of these activities, I would do so cautiously and under close supervision. I do not think you should return to driving unless  you undergo on the road testing and it may be best to simply refrain from driving as you have been doing.   I would recommend to the extent possible that you engage in healthy lifestyle behaviors. This includes engaging in mentally stimulating activities. This can include formal types of brain training, such as cognitive rehabilitation, sudoku, crossword puzzles and the like, although the best thing is to stay active and engaged in your life. This includes having meaningful hobbies and interests that you pursue and especially cultivating satisfying social relationships, which are a form of cognitive stimulation and have been shown to be protective in and of themselves.    I also strongly concur with Dr. Derek Mound suggestion that you re-visit PM&R for evaluation and treatment. Your physical activity level is low and you are likely not performing at your best related to deconditioning, which could be addressed through physical therapy, occupational therapy or by other means.   There is a significant research base and evidence of effectiveness for something called "behavioral activation," which is a fancy way of saying that you should increase your activity level. In general, people do not feel as happy or do as well when they are not doing much. This can include things like getting out for walks, re-engaging in hobbies, spending time with family or friends, or learning a new hobby. It's not so important what you do as that you enjoy it and stick with it. Depression can start a vicious cycle where you are not doing a lot because you don't feel well, which leads to less things to be excited and happy about, and thus more depression and behavioral avoidance. It can be hard to change this pattern once it has started but most people find that they feel better when they start doing more even if they don't enjoy it at first.  Test Findings  Test scores are summarized in additional documentation associated with this  encounter. Test scores are relative to age, gender, and educational history as available and appropriate. There were concerns about performance validity given performance on one standalone measure falling well below expected levels (and well below chance levels) and his performance on several embedded measures was also very low. There is a high level of confidence given the magnitude of these low scores that the data are noncredible, with >99% probability assuming a 20% base rate of invalid test performance. Low scores should not be interpreted as reflecting cognitive impairment given these findings, although adequate scores may be interpreted as suggestive of at least adequate abilities (though actual abilities may be a bit better)>    General Intellectual Functioning/Achievement:  Performance on single word reading was low average. Performance on the RIST index was at the margin of the low average and unusually low ranges. There was some potentially clinically relevant variability with unusually low performance on the verbally mediated indicator and low average performance on the more visually oriented indicator. These findings would temper expectations for his test data significantly were the findings in fact valid.   Attention and Processing Efficiency: Indicators of Working Memory generated mainly low scores of uncertain significance in the context of performance validity concerns. He did achieve a reasonable low average score on digit resequencing in ascending order. All other indicators were low.   Processing speed was low on all indicators, the significance of which is not clear.   Language: Performance on language measures showed low scores on indicators of basic abilities (I.e., naming) and associative verbal fluency, which are scores of dubious significance given validity concerns.   Visuospatial Function: Performance on visuospatial indicators showed adequate performance on judgment of  angular line orientaitons, suggesting adequate perceptual abilities. Constructional performance was low, the significance of which is unclear.   Learning and Memory: Performance on measures of learning and memory was low, with low scores for indicators of immediate and delayed recall of verbal and visual information. These findings are of unclear clinical significance given performance validity findings.   Executive Functions: Performance on executive indicators interestingly enough showed a reasonable low average range score on the very challenging Trail Making Test B, a measure of cognitive flexibility involving visual scanning, set switching, and working memory. All other scores were low and of unclear significance.   Rating Scale(s): Arthur [name] was characterized as having significant functional difficulties by his sister on the FAQ, requiring assistance in nearly all areas and depending with more complex things (e.g., keeping track of current events, managing taxes/business affairs, traveling outside the neighborhood, and keeping up with current events.   Arthur [name] Arthur Kindred, PsyD, ABN Clinical Neuropsychologist  Coding and Compliance  Billing below reflects technician time, my direct face-to-face time with the patient, time spent in test administration, and time spent in professional activities including but not limited to: neuropsychological test interpretation, integration of neuropsychological test data with clinical history, report preparation, treatment planning, care coordination, and review of diagnostically pertinent medical history or studies.   Services associated with this encounter: Clinical Interview [phone]) plus 165 minutes (96132/96133; Neuropsychological Evaluation by Professional)  180 minutes ([id number]/[id number]; Neuropsychological Testing by Technician)

## 2021-02-11 NOTE — Telephone Encounter (Signed)
[facility] called and stated that sulfadiazine is no longer available. Requested alternative for the medication.   Jenice Leiner Loyola Mast, RN

## 2021-02-12 ENCOUNTER — Other Ambulatory Visit (HOSPITAL_COMMUNITY): Payer: Self-pay

## 2021-02-12 ENCOUNTER — Other Ambulatory Visit: Payer: Self-pay | Admitting: Pharmacist

## 2021-02-12 DIAGNOSIS — B582 Toxoplasma meningoencephalitis: Secondary | ICD-10-CM

## 2021-02-12 MED ORDER — SULFADIAZINE 500 MG PO TABS
1000.0000 mg | ORAL_TABLET | Freq: Two times a day (BID) | ORAL | 5 refills | Status: DC
Start: 2021-02-12 — End: 2021-04-07
  Filled 2021-02-12 – 2021-02-15 (×2): qty 120, 30d supply, fill #0

## 2021-02-12 NOTE — Telephone Encounter (Signed)
We are able to get it at [facility]. I'll send over Rx and have it mailed to him!

## 2021-02-13 ENCOUNTER — Other Ambulatory Visit (HOSPITAL_COMMUNITY): Payer: Self-pay

## 2021-02-15 ENCOUNTER — Other Ambulatory Visit (HOSPITAL_COMMUNITY): Payer: Self-pay

## 2021-02-16 ENCOUNTER — Encounter: Payer: Self-pay | Admitting: Counselor

## 2021-02-16 ENCOUNTER — Ambulatory Visit (INDEPENDENT_AMBULATORY_CARE_PROVIDER_SITE_OTHER): Payer: Medicaid Other | Admitting: Counselor

## 2021-02-16 ENCOUNTER — Other Ambulatory Visit: Payer: Self-pay

## 2021-02-16 DIAGNOSIS — F0280 Dementia in other diseases classified elsewhere without behavioral disturbance: Secondary | ICD-10-CM | POA: Diagnosis not present

## 2021-02-16 NOTE — Patient Instructions (Addendum)
Your presentation and performance on assessment today were invalid. Just like a patient must lie still when undergoing an Xray for the picture to be interpretable, there are a set of assumptions that must be met for neuropsychological test findings to be of meaning. Your performance on validity measures suggests that we cannot be entirely sure that was the case. Validity problems can be caused by any number of things such as fatigue, difficulty maintaining attention, sensory impairments, idiosyncratic approaches to test taking, or misunderstanding test instructions. Regardless of the cause, suboptimal performance validity means that we cannot have much faith in the reliability of the test data as indicators of your true level of cognitive functioning.   My sense is that you likely have a major neurocognitive disorder level problem. This term is equivalent to the term "dementia," but is often used to distinguish causes of cognitive impairment that are not age-related or necessarily progressive from things such as Alzheimer's disease. In your case, your cognitive problems are likely the result of several factors. You had cerebral toxoplasmosis, particularly affecting the brainstem and cerebellum in addition to other areas, which would be expected to cause significant cognitive problems. You also were found to have neurosyphilis, which can cause significant cognitive problems if not treated promptly. Finally, HIV and AIDS can cause HIV associated neurocognitive disorder, particularly when not treated with appropriate therapy, and there may be some component of that.   In terms of your current limitations, I am unable to provide definitive guidance as to your driving safety, ability to live independently, manage money, or the like because your test data are invalid. I can tell you that individuals with major neurocognitive disorder required assistance with at least one major life activity (e.g., managing finances,  being unable to work) and often times multiple areas. If you do attempt to reengage in any of these activities, I would do so cautiously and under close supervision. I do not think you should return to driving unless you undergo on the road testing and it may be best to simply refrain from driving as you have been doing.   I would recommend to the extent possible that you engage in healthy lifestyle behaviors. This includes engaging in mentally stimulating activities. This can include formal types of brain training, such as cognitive rehabilitation, sudoku, crossword puzzles and the like, although the best thing is to stay active and engaged in your life. This includes having meaningful hobbies and interests that you pursue and especially cultivating satisfying social relationships, which are a form of cognitive stimulation and have been shown to be protective in and of themselves.    I also strongly concur with Dr. Derek Mound suggestion that you re-visit PM&R for evaluation and treatment. Your physical activity level is low and you are likely not performing at your best related to deconditioning, which could be addressed through physical therapy, occupational therapy or by other means. You said that they haven't reached out to you, perhaps call the Neuro[facility]habilitation center and reach out to them. They can be reached at [phone].  There is a significant research base and evidence of effectiveness for something called "behavioral activation," which is a fancy way of saying that you should increase your activity level. In general, people do not feel as happy or do as well when they are not doing much. This can include things like getting out for walks, re-engaging in hobbies, spending time with family or friends, or learning a new hobby. It's not so important  what you do as that you enjoy it and stick with it. Depression can start a vicious cycle where you are not doing a lot because you don't feel well,  which leads to less things to be excited and happy about, and thus more depression and behavioral avoidance. It can be hard to change this pattern once it has started but most people find that they feel better when they start doing more even if they don't enjoy it at first.

## 2021-02-16 NOTE — Progress Notes (Signed)
NEUROPSYCHOLOGY FEEDBACK NOTE [name]  Feedback Note: I met with Arthur [name] to review the findings resulting from his neuropsychological evaluation. Since the last appointment, he has been the same. He was accompanied by his mother, Arthur [name]. Time was spent reviewing the impressions and recommendations that are detailed in the evaluation report. Unfortunately in Arthur [name] case, his test data were invalid. He has no clear external incentive to reduce his performance and appeared to be well engaged outwardly, but with indicators falling below chance levels it is dubious that the assumptions underlying the interpretation of test findings hold. Accordingly, the exam is inconclusive. I reviewed with him expectation of a major neurocognitive disorder level problem given his injury complex. Also reviewed recommendation for behavioral activation, more engagement with the community, and suggested they scaffold him to greater levels of independence. For instance they do not let him cook. He is likely capable of cooking with some slight supervision. Other topics of discussion as reflected in the patient instructions. I took time to explain the findings and answer all the patient's questions. I encouraged [name] to contact me should he have any further questions or if further follow up is desired.   Current Medications and Medical History   Current Outpatient Medications  Medication Sig Dispense Refill  . acetaminophen (TYLENOL) 500 MG tablet Take 500 mg by mouth every 6 (six) hours as needed.    [name] atorvastatin (LIPITOR) 20 MG tablet Take 1 tablet (20 mg total) by mouth daily. 90 tablet 3  . bictegravir-emtricitabine-tenofovir AF (BIKTARVY) 50-200-25 MG TABS tablet Take 1 tablet by mouth daily. 30 tablet 11  . citalopram (CELEXA) 20 MG tablet Take 1 tablet (20 mg total) by mouth daily. 30 tablet 3  . divalproex (DEPAKOTE) 250 MG DR tablet One tablet twice a day for 2 weeks, then take 2  tablets twice a day 120 tablet 3  . famotidine (PEPCID) 20 MG tablet Take 1 tablet (20 mg total) by mouth at bedtime. 30 tablet 11  . hydrOXYzine (ATARAX/VISTARIL) 25 MG tablet Take 1 tablet (25 mg total) by mouth at bedtime as needed (insomnia). 30 tablet 11  . leucovorin (WELLCOVORIN) 25 MG tablet Take 1 tablet (25 mg total) by mouth daily. 30 tablet 5  . Menthol-Methyl Salicylate (MUSCLE RUB) 10-15 % CREA Apply 1 application topically as needed for muscle pain.  0  . ondansetron (ZOFRAN) 4 MG tablet Take 1 tablet (4 mg total) by mouth every 6 (six) hours as needed for nausea. 20 tablet 0  . pyrimethamine (DARAPRIM) 25 MG tablet Take 2 tablets (50 mg total) by mouth daily with breakfast. 60 tablet 5  . sulfaDIAZINE 500 MG tablet Take 2 tablets (1,000 mg total) by mouth 2 (two) times daily. 120 tablet 5  . sulfamethoxazole-trimethoprim (BACTRIM DS) 800-160 MG tablet TAKE 1 TABLET BY MOUTH 3 TIMES A WEEK 12 tablet 5  . SUMAtriptan (IMITREX) 50 MG tablet Take 1 tablet (50 mg total) by mouth every 2 (two) hours as needed for migraine. May repeat in 2 hours if headache persists or recurs. 10 tablet 0  . terbinafine (LAMISIL) 1 % cream Apply topically daily. Apply to feet daily 30 g 0   No current facility-administered medications for this visit.    Patient Active Problem List   Diagnosis Date Noted  . 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 Headache syndrome 02/04/2021  . Insomnia 01/26/2021  . Lung consolidation ([place])   . Ground glass opacity  present on imaging of lung   . AIDS ([place])   . 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 S/P percutaneous endoscopic gastrostomy (PEG) tube placement ([place])   . Arterial hypotension   . Slow transit constipation   . Labile blood glucose   . Hypomagnesemia   . Acute blood loss anemia   . Leucopenia   . Labile blood pressure   . HIV disease ([place])   . Debility 09/07/2019  . Aspiration into airway   . Palliative care by  specialist   . DNR (do not resuscitate) discussion   . Weakness generalized   . Dysphagia   . Protein-calorie malnutrition, severe 08/19/2019  . CNS toxoplasmosis ([place]) 08/12/2019  . Goals of care, counseling/discussion   . Bacterial meningitis   . Thrush of mouth and esophagus ([place])   . Palliative care encounter   . HIV (human immunodeficiency virus infection) ([place]) 07/29/2019  . Vision loss 07/29/2019  . Abnormal brain MRI 07/29/2019  . Acquired immunodeficiency syndrome ([place]) 07/28/2019  . Bradycardia 07/28/2019  . Hyponatremia 07/28/2019  . Nausea and vomiting 03/25/2013  . Muscle spasm 03/25/2013  . Drug use 01/11/2012  . 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 COUGH 06/23/2009  . CHEST PAIN, PLEURITIC 06/23/2009  . FUNGAL DERMATITIS 05/28/2008  . GERD 05/28/2008  . DENTAL CARIES 02/13/2008  . NICOTINE ADDICTION 11/14/2007  . BACK STRAIN, LUMBAR 08/22/2007  . 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 WEIGHT LOSS, RECENT 05/01/2007  . ANXIETY DEPRESSION 02/14/2007  . HIV DISEASE 02/13/2007  . CERVICAL LYMPHADENOPATHY, ANTERIOR, RIGHT 02/13/2007    Mental Status and Behavioral Observations  Arthur [name] presented on time to the present encounter and was alert and generally oriented. Speech was normal in rate, rhythm, volume, and prosody. Self-reported mood was "good" and affect was neutral. Thought process was difficult to assess given limited spontaneous speech and thought content was appropriate to the topics discussed. There were no safety concerns identified at today's encounter, such as thoughts of harming self or others.   Plan  Feedback provided regarding the patient's neuropsychological evaluation. Unfortunately, his test data are invalid, although I am still convinced that he likely does have a major neurocognitive disorder level problem given multiple issues (I.e., cerebral toxoplasmosis, HAND, and neurosyphilis). Arthur [name] was encouraged to contact me if any questions arise  or if further follow up is desired.   Arthur Simas Nicole Kindred, PsyD, ABN Clinical Neuropsychologist  Service(s) Provided at This Encounter: 27 minutes [phone]; Conjoint therapy with patient present)

## 2021-02-19 ENCOUNTER — Other Ambulatory Visit (HOSPITAL_COMMUNITY): Payer: Self-pay

## 2021-02-20 DIAGNOSIS — H1013 Acute atopic conjunctivitis, bilateral: Secondary | ICD-10-CM | POA: Diagnosis not present

## 2021-02-20 DIAGNOSIS — H5203 Hypermetropia, bilateral: Secondary | ICD-10-CM | POA: Diagnosis not present

## 2021-02-23 ENCOUNTER — Other Ambulatory Visit (HOSPITAL_COMMUNITY): Payer: Self-pay

## 2021-03-01 NOTE — Telephone Encounter (Signed)
Tried to get in touch with patient last week but had no luck. Luckily, he does see you tomorrow!

## 2021-03-02 ENCOUNTER — Ambulatory Visit (INDEPENDENT_AMBULATORY_CARE_PROVIDER_SITE_OTHER): Payer: Medicaid Other | Admitting: Infectious Disease

## 2021-03-02 ENCOUNTER — Other Ambulatory Visit: Payer: Self-pay

## 2021-03-02 VITALS — BP 118/75 | HR 91 | Temp 98.0°F | Wt 199.5 lb

## 2021-03-02 DIAGNOSIS — B20 Human immunodeficiency virus [HIV] disease: Secondary | ICD-10-CM

## 2021-03-02 DIAGNOSIS — G4489 Other headache syndrome: Secondary | ICD-10-CM | POA: Diagnosis not present

## 2021-03-02 DIAGNOSIS — F0280 Dementia in other diseases classified elsewhere without behavioral disturbance: Secondary | ICD-10-CM | POA: Diagnosis not present

## 2021-03-02 DIAGNOSIS — R9089 Other abnormal findings on diagnostic imaging of central nervous system: Secondary | ICD-10-CM | POA: Diagnosis not present

## 2021-03-02 DIAGNOSIS — B582 Toxoplasma meningoencephalitis: Secondary | ICD-10-CM | POA: Diagnosis not present

## 2021-03-02 DIAGNOSIS — A523 Neurosyphilis, unspecified: Secondary | ICD-10-CM | POA: Diagnosis not present

## 2021-03-02 NOTE — Progress Notes (Signed)
Chief concern: Follow-up for HIV disease on medications Subjective:    Patient ID: Arthur [name], male    DOB: 21-Apr-1981, 40 y.o.   MRN: [id number]  HPI   Arthur [name] is a 40 year-old African-American man living with HIV who  fell out of care and was not seen by me since 2015.  He previously failed Atripla with resistance and IK 103 when he is being seen by Dr. Philipp Deputy.  I changed him to a protease-based regimen with Prezista Norvir and Truvada but he had not been seen since 2015.  He was admitted to the hospital with profound encephalopathy.  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  He was also broadly treated for other possible CNS pathology including CMV encephalitis with ganciclovir RI PE for possible tuberculosis with steroids.  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  He was ultimately discharged from the inpatient hospital service to inpatient rehab and completed therapy while there.  Profound dysphagia and nutritional deficiency he was given a PEG tube through which she was getting his medications along with nutrition.  He is living at home with his mother and also his sister who is a CMA and have been overseeing his medications.  He has had dramatic improvement in his neurological status and maintained perfect virological control. We retreated him for Neurosyphilis when his titers went up. He had low hgb while on PCN but never verified this prior to completion.  His CD4 is slow to come up.  I added Rukobia in July 2021 in hopes to help increase his CD4 count but he did not tolerate this medication with nausea and vomiting.  He has remained suppressed.  I saw Arthur [name] in March 2022  .  He has been having headaches in the left side of his head sometimes wake him up at night.  He rates them as being about a 7 out of 10 in severity and they are also of a stabbing type of nature.   They do respond to Tylenol and typically resolve within 40 minutes.  Sometimes though they do keep him up at night.  He was also having trouble with insomnia in general despite switching BIKTARVY to morning time doses.  The hydroxyzine seen does help him sleep but he does not wheeze take it.  His mother was discouraging of taking every night because she was concerned he might become addicted to it.  He was supposed to get labs that day but did not.  He came  back to clinic at his last visit with  his mother   Referred him to neurology who have been working him up for headaches and also has cognitive impairments.  He is also seeing neuropsychology.  His viral load remains suppressed on labs we checked in May but CD4 still is in the 44s and not coming up very much.  We had to get sulfadiazine through [facility] is [facility] did not have it there is concern now that sulfadiazine may not be available nationally.  This is the case we will have to switch and clindamycin.  I discussed changing his meds over to [facility] which he and his mother were willing to do when they learned that was long can also shipped the medications to his house as the mail order pharmacy out of [place] does for [employer].    Past Medical History:  Diagnosis Date  . Anemia   .  Anxiety   . Depression   . GERD (gastroesophageal reflux disease)   . Headache   . Headache syndrome 02/04/2021  . HIV infection (HCC)   . Insomnia 01/26/2021  . Substance abuse [facility])     Past Surgical History:  Procedure Laterality Date  . IR GASTROSTOMY TUBE MOD SED  09/02/2019  . IR GASTROSTOMY TUBE REMOVAL  10/28/2019  . TOOTH EXTRACTION      Family History  Problem Relation Age of Onset  . Diabetes Neg Hx   . CAD Neg Hx   . Hypertension Neg Hx       Social History   Socioeconomic History  . Marital status: Single    Spouse name: Not on file  . Number of children: Not on file  . Years of education: Not  on file  . Highest education level: Not on file  Occupational History  . Not on file  Tobacco Use  . Smoking status: Current Every Day Smoker    Packs/day: 0.30    Years: 17.00    Pack years: 5.10    Types: Cigarettes  . Smokeless tobacco: Never Used  . Tobacco comment: encouraged to quit  Vaping Use  . Vaping Use: Never used  Substance and Sexual Activity  . Alcohol use: No    Comment: occassionally  . Drug use: Not Currently    Types: Marijuana  . Sexual activity: Not Currently    Partners: Male    Comment: declined condoms 09/15/20  Other Topics Concern  . Not on file  Social History Narrative   Right Handed   Drinks 2-3 cups caffeine daily   Social Determinants of Health   Financial Resource Strain: Not on file  Food Insecurity: Not on file  Transportation Needs: Not on file  Physical Activity: Not on file  Stress: Not on file  Social Connections: Not on file    No Known Allergies   Current Outpatient Medications:  .  acetaminophen (TYLENOL) 500 MG tablet, Take 500 mg by mouth every 6 (six) hours as needed., Disp: , Rfl:  .  atorvastatin (LIPITOR) 20 MG tablet, Take 1 tablet (20 mg total) by mouth daily., Disp: 90 tablet, Rfl: 3 .  bictegravir-emtricitabine-tenofovir AF (BIKTARVY) 50-200-25 MG TABS tablet, Take 1 tablet by mouth daily., Disp: 30 tablet, Rfl: 11 .  citalopram (CELEXA) 20 MG tablet, Take 1 tablet (20 mg total) by mouth daily., Disp: 30 tablet, Rfl: 3 .  divalproex (DEPAKOTE) 250 MG DR tablet, One tablet twice a day for 2 weeks, then take 2 tablets twice a day, Disp: 120 tablet, Rfl: 3 .  famotidine (PEPCID) 20 MG tablet, Take 1 tablet (20 mg total) by mouth at bedtime., Disp: 30 tablet, Rfl: 11 .  hydrOXYzine (ATARAX/VISTARIL) 25 MG tablet, Take 1 tablet (25 mg total) by mouth at bedtime as needed (insomnia)., Disp: 30 tablet, Rfl: 11 .  leucovorin (WELLCOVORIN) 25 MG tablet, Take 1 tablet (25 mg total) by mouth daily., Disp: 30 tablet, Rfl: 5 .   Menthol-Methyl Salicylate (MUSCLE RUB) 10-15 % CREA, Apply 1 application topically as needed for muscle pain., Disp:  , Rfl: 0 .  ondansetron (ZOFRAN) 4 MG tablet, Take 1 tablet (4 mg total) by mouth every 6 (six) hours as needed for nausea., Disp: 20 tablet, Rfl: 0 .  pyrimethamine (DARAPRIM) 25 MG tablet, Take 2 tablets (50 mg total) by mouth daily with breakfast., Disp: 60 tablet, Rfl: 5 .  sulfaDIAZINE 500 MG tablet, Take 2 tablets (1,000 mg  total) by mouth 2 (two) times daily., Disp: 120 tablet, Rfl: 5 .  sulfamethoxazole-trimethoprim (BACTRIM DS) 800-160 MG tablet, TAKE 1 TABLET BY MOUTH 3 TIMES A WEEK, Disp: 12 tablet, Rfl: 5 .  SUMAtriptan (IMITREX) 50 MG tablet, Take 1 tablet (50 mg total) by mouth every 2 (two) hours as needed for migraine. May repeat in 2 hours if headache persists or recurs., Disp: 10 tablet, Rfl: 0 .  terbinafine (LAMISIL) 1 % cream, Apply topically daily. Apply to feet daily, Disp: 30 g, Rfl: 0   Review of Systems  Unable to perform ROS: Mental status change  Neurological: Positive for headaches.  Psychiatric/Behavioral: Positive for sleep disturbance.       Objective:   Physical Exam Constitutional:      Appearance: He is overweight.  HENT:     Head: Normocephalic.     Nose: Nose normal.  Eyes:     Extraocular Movements: Extraocular movements intact.  Cardiovascular:     Rate and Rhythm: Normal rate and regular rhythm.  Pulmonary:     Effort: Pulmonary effort is normal. No respiratory distress.  Neurological:     General: No focal deficit present.     Mental Status: He is alert and oriented to person, place, and time.  Psychiatric:        Attention and Perception: Attention normal.        Mood and Affect: Affect normal.        Speech: Speech is delayed.        Behavior: Behavior is cooperative.        Cognition and Memory: Cognition is impaired. Memory is impaired.     Comments: Tired  Appearing today. 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   Bactrim for PCP prevention.  Sitter adding [name] again in a few months if we can premedicate him with antiemetics  Neurosyphilis: His completed SECOND  treatment with intravenous penicillin and IM PCN   HIV associated neurological disorder: Supportive therapy is good he is with his mother and he is seeing neurology and neuropsychology   COVID-19 prevention: may need to get him [place]  I spent more than 40 minutes with the patient including greater than 50% of time in face to face counseling of the patient personally reviewing radiographs, long with pertinent laboratory microbiological data review of medical records and in coordination of his care.

## 2021-04-07 ENCOUNTER — Other Ambulatory Visit: Payer: Self-pay | Admitting: Infectious Disease

## 2021-04-07 ENCOUNTER — Telehealth: Payer: Self-pay | Admitting: Infectious Disease

## 2021-04-07 ENCOUNTER — Telehealth: Payer: Self-pay

## 2021-04-07 DIAGNOSIS — B589 Toxoplasmosis, unspecified: Secondary | ICD-10-CM

## 2021-04-07 MED ORDER — CLINDAMYCIN HCL 300 MG PO CAPS: 600.0000 mg | ORAL_CAPSULE | Freq: Three times a day (TID) | ORAL | 11 refills | Status: DC

## 2021-04-07 NOTE — Telephone Encounter (Signed)
Spoke with patient's mother to let her know that clindamycin has been sent in to replace the sulfadiazine. She verbalized understanding and wants to make sure that Dr. [name] gets the transportation forms she dropped off for the patient. RN provided Dr. [name] with the forms.   Sandie Ano, RN

## 2021-04-07 NOTE — Telephone Encounter (Signed)
Dr. [name] is currently checking on this for the patient to see if there are any alternatives for the patient.

## 2021-04-07 NOTE — Telephone Encounter (Signed)
Transportation forms completed by Dr. [name]. Original is at front desk, patient's mother states she will pick up tomorrow. Copy of forms in triage.   Sandie Ano, RN

## 2021-04-07 NOTE — Telephone Encounter (Signed)
Patients mom came into the office wanting the provider to know that medication is no longer available at any pharmacy they have been too. She was told they are no longer making the medication anymore and want to know what alternatives do you have available for the patient    sulfaDIAZINE 500 MG tablet  [facility] - [place], [place] - [address]     Please call and advise

## 2021-04-07 NOTE — Telephone Encounter (Signed)
We will switch in clindamycin for now though I am also interested in some other options that we can discuss at our next visit in August

## 2021-04-26 ENCOUNTER — Ambulatory Visit: Payer: Medicaid Other | Admitting: Neurology

## 2021-05-04 ENCOUNTER — Encounter: Payer: Self-pay | Admitting: Infectious Disease

## 2021-05-04 ENCOUNTER — Other Ambulatory Visit: Payer: Self-pay

## 2021-05-04 ENCOUNTER — Ambulatory Visit (INDEPENDENT_AMBULATORY_CARE_PROVIDER_SITE_OTHER): Payer: Medicaid Other | Admitting: Infectious Disease

## 2021-05-04 VITALS — BP 114/77 | HR 86 | Temp 98.2°F | Ht 71.0 in | Wt 207.0 lb

## 2021-05-04 DIAGNOSIS — R531 Weakness: Secondary | ICD-10-CM

## 2021-05-04 DIAGNOSIS — A523 Neurosyphilis, unspecified: Secondary | ICD-10-CM | POA: Diagnosis not present

## 2021-05-04 DIAGNOSIS — R9089 Other abnormal findings on diagnostic imaging of central nervous system: Secondary | ICD-10-CM | POA: Diagnosis not present

## 2021-05-04 DIAGNOSIS — B582 Toxoplasma meningoencephalitis: Secondary | ICD-10-CM

## 2021-05-04 DIAGNOSIS — B20 Human immunodeficiency virus [HIV] disease: Secondary | ICD-10-CM | POA: Diagnosis not present

## 2021-05-04 MED ORDER — ONDANSETRON HCL 4 MG PO TABS: 4.0000 mg | ORAL_TABLET | Freq: Four times a day (QID) | ORAL | 4 refills | Status: DC | PRN

## 2021-05-04 MED ORDER — SULFAMETHOXAZOLE-TRIMETHOPRIM 800-160 MG PO TABS: 1.0000 | ORAL_TABLET | Freq: Two times a day (BID) | ORAL | 11 refills | Status: DC

## 2021-05-04 MED ORDER — RUKOBIA 600 MG PO TB12: 600.0000 mg | ORAL_TABLET | Freq: Two times a day (BID) | ORAL | 11 refills | Status: DC

## 2021-05-04 NOTE — Progress Notes (Signed)
Chief complaint: Follow-up for HIV disease on medications Subjective:    Patient ID: Arthur [name], male    DOB: 1981-03-31, 40 y.o.   MRN: [id number]  HPI   Arthur [name] is a 40 year-old African-American man living with HIV who  fell out of care and was not seen by me since 2015.  He previously failed Atripla with resistance and IK 103 when he is being seen by Dr. Philipp Deputy.  I changed him to a protease-based regimen with Prezista Norvir and Truvada but he had not been seen since 2015.  He was admitted to the hospital with profound encephalopathy.  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  He was also broadly treated for other possible CNS pathology including CMV encephalitis with ganciclovir RI PE for possible tuberculosis with steroids.  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  He was ultimately discharged from the inpatient hospital service to inpatient rehab and completed therapy while there.  Was previously suffering from dysphagia and nutritional deficiency he was given a PEG tube through which she was getting his medications along with nutrition.  He is living at home with his mother and also his sister who is a CMA and have been overseeing his medications.  He has had dramatic improvement in his neurological status and maintained perfect virological control. We retreated him for Neurosyphilis when his titers went up. He had low hgb while on PCN but never verified this prior to completion.  His CD4 is slow to come up.  I added Rukobia in July 2021 in hopes to help increase his CD4 count but he did not tolerate this medication with nausea and vomiting.  He has remained suppressed.  I saw Arthur [name] in March 2022  He had  been having headaches in the left side of his head sometimes wake him up at night.  He rated them as being about a 7 out of 10 in severity and they are also of a  stabbing type of nature.  They ddid  respond to Tylenol and typically resolve within 40 minutes.  .  The hydroxyzine seen does help him sleep  Referred him to neurology who have been working him up for headaches and also has cognitive impairments.  He is also seeing neuropsychology.  His viral load remains suppressed on labs we checked in May but CD4 still is in the 39s and was not coming up very much.  Returns to clinic today with his mother.  In the interim we had to change out his for diazine for clindamycin.  Today I felt like it would be a good idea to simplify his toxoplasma secondary prophylaxis by placing him on 1 double strength Bactrim twice daily and getting rid of the pyrimethamine clindamycin and leucovorin.  Would also like to see if rechallenge with [name] with antiemetic therapy may help him.    Past Medical History:  Diagnosis Date   Anemia    Anxiety    Depression    GERD (gastroesophageal reflux disease)    Headache    Headache syndrome 02/04/2021   HIV infection (HCC)    Insomnia 01/26/2021   Substance abuse (HCC)     Past Surgical History:  Procedure Laterality Date   IR GASTROSTOMY TUBE MOD SED  09/02/2019   IR GASTROSTOMY TUBE REMOVAL  10/28/2019   TOOTH EXTRACTION      Family History  Problem Relation Age of Onset  Diabetes Neg Hx    CAD Neg Hx    Hypertension Neg Hx       Social History   Socioeconomic History   Marital status: Single    Spouse name: Not on file   Number of children: Not on file   Years of education: Not on file   Highest education level: Not on file  Occupational History   Not on file  Tobacco Use   Smoking status: Every Day    Packs/day: 0.30    Years: 17.00    Pack years: 5.10    Types: Cigarettes   Smokeless tobacco: Never   Tobacco comments:    encouraged to quit  Vaping Use   Vaping Use: Never used  Substance and Sexual Activity   Alcohol use: No    Comment: occassionally   Drug use: Not Currently     Types: Marijuana   Sexual activity: Not Currently    Partners: Male    Comment: declined condoms 09/15/20  Other Topics Concern   Not on file  Social History Narrative   Right Handed   Drinks 2-3 cups caffeine daily   Social Determinants of Health   Financial Resource Strain: Not on file  Food Insecurity: Not on file  Transportation Needs: Not on file  Physical Activity: Not on file  Stress: Not on file  Social Connections: Not on file    No Known Allergies   Current Outpatient Medications:    fostemsavir tromethamine (RUKOBIA) 600 MG TB12 ER tablet, Take 1 tablet (600 mg total) by mouth 2 (two) times daily., Disp: 60 tablet, Rfl: 11   acetaminophen (TYLENOL) 500 MG tablet, Take 500 mg by mouth every 6 (six) hours as needed., Disp: , Rfl:    atorvastatin (LIPITOR) 20 MG tablet, Take 1 tablet (20 mg total) by mouth daily., Disp: 90 tablet, Rfl: 3   bictegravir-emtricitabine-tenofovir AF (BIKTARVY) 50-200-25 MG TABS tablet, Take 1 tablet by mouth daily., Disp: 30 tablet, Rfl: 11   citalopram (CELEXA) 20 MG tablet, Take 1 tablet (20 mg total) by mouth daily., Disp: 30 tablet, Rfl: 3   divalproex (DEPAKOTE) 250 MG DR tablet, One tablet twice a day for 2 weeks, then take 2 tablets twice a day, Disp: 120 tablet, Rfl: 3   famotidine (PEPCID) 20 MG tablet, Take 1 tablet (20 mg total) by mouth at bedtime., Disp: 30 tablet, Rfl: 11   hydrOXYzine (ATARAX/VISTARIL) 25 MG tablet, Take 1 tablet (25 mg total) by mouth at bedtime as needed (insomnia)., Disp: 30 tablet, Rfl: 11   Menthol-Methyl Salicylate (MUSCLE RUB) 10-15 % CREA, Apply 1 application topically as needed for muscle pain., Disp:  , Rfl: 0   ondansetron (ZOFRAN) 4 MG tablet, Take 1 tablet (4 mg total) by mouth every 6 (six) hours as needed for nausea., Disp: 120 tablet, Rfl: 4   sulfamethoxazole-trimethoprim (BACTRIM DS) 800-160 MG tablet, Take 1 tablet by mouth 2 (two) times daily. TAKE 1 TABLET BY MOUTH 3 TIMES A WEEK, Disp: 60  tablet, Rfl: 11   SUMAtriptan (IMITREX) 50 MG tablet, Take 1 tablet (50 mg total) by mouth every 2 (two) hours as needed for migraine. May repeat in 2 hours if headache persists or recurs., Disp: 10 tablet, Rfl: 0   terbinafine (LAMISIL) 1 % cream, Apply topically daily. Apply to feet daily, Disp: 30 g, Rfl: 0   Review of Systems  Unable to perform ROS: Dementia      Objective:   Physical Exam Constitutional:  Appearance: He is well-developed.  HENT:     Head: Normocephalic and atraumatic.  Eyes:     Conjunctiva/sclera: Conjunctivae normal.  Cardiovascular:     Rate and Rhythm: Normal rate and regular rhythm.  Pulmonary:     Effort: Pulmonary effort is normal. No respiratory distress.     Breath sounds: No wheezing.  Abdominal:     General: There is no distension.     Palpations: Abdomen is soft.  Musculoskeletal:        General: No tenderness. Normal range of motion.     Cervical back: Normal range of motion and neck supple.  Skin:    General: Skin is warm and dry.     Coloration: Skin is not pale.     Findings: No erythema or rash.  Neurological:     General: No focal deficit present.     Mental Status: He is alert.  Psychiatric:        Mood and Affect: Mood normal.        Speech: Speech is delayed.        Thought Content: Thought content normal.        Cognition and Memory: Cognition is impaired. He exhibits impaired remote memory.         Assessment & Plan:    CNS toxoplasmosis we will change over to Bactrim double strength tablet twice daily to simplify his pill burden   HIV disease AIDS: Will add Rukobia to his Biktarvy but premedicate him with Zofran.  Hopefully while taking Rukobia we can see his CD4 count go up.  We will check a viral load and CD4 count CMP CBC with differential syphilis and GC and chlamydia today.  Neurosyphilis he is status post his second round of intravenous penicillin and IM penicillin  HIV-associated neurological disorder:  Supportive therapy will be continued I have also filled out a form documenting he cannot walk to public transport [facility] so that he can be retrieved by the [facility] [employer] bus.  I spent 41  minutes with the patient including face to face counseling of the patient and his mother regarding the different options for toxoplasmosis secondary prevention, my desire to try to get his CD4 count up by adding [name] and how we would try to help him tolerate this with premedication with Zofran and as needed Zofran for nausea, alongreview of medical records before and during the visit and in coordination of his care.

## 2021-05-05 LAB — T-HELPER CELL (CD4) - (RCID CLINIC ONLY)
CD4 % Helper T Cell: 7 % — ABNORMAL LOW (ref 33–65)
CD4 T Cell Abs: 86 /uL — ABNORMAL LOW (ref 400–1790)

## 2021-05-06 LAB — CBC WITH DIFFERENTIAL/PLATELET
Absolute Monocytes: 499 cells/uL (ref 200–950)
Basophils Absolute: 20 cells/uL (ref 0–200)
Basophils Relative: 0.5 %
Eosinophils Absolute: 191 cells/uL (ref 15–500)
Eosinophils Relative: 4.9 %
HCT: 41.9 % (ref 38.5–50.0)
Hemoglobin: 14 g/dL (ref 13.2–17.1)
Lymphs Abs: 1205 cells/uL (ref 850–3900)
MCH: 31.4 pg (ref 27.0–33.0)
MCHC: 33.4 g/dL (ref 32.0–36.0)
MCV: 93.9 fL (ref 80.0–100.0)
MPV: 11.3 fL (ref 7.5–12.5)
Monocytes Relative: 12.8 %
Neutro Abs: 1985 cells/uL (ref 1500–7800)
Neutrophils Relative %: 50.9 %
Platelets: 222 10*3/uL (ref 140–400)
RBC: 4.46 10*6/uL (ref 4.20–5.80)
RDW: 14.2 % (ref 11.0–15.0)
Total Lymphocyte: 30.9 %
WBC: 3.9 10*3/uL (ref 3.8–10.8)

## 2021-05-06 LAB — COMPLETE METABOLIC PANEL WITH GFR
AG Ratio: 1 (calc) (ref 1.0–2.5)
ALT: 14 U/L (ref 9–46)
AST: 18 U/L (ref 10–40)
Albumin: 4 g/dL (ref 3.6–5.1)
Alkaline phosphatase (APISO): 55 U/L (ref 36–130)
BUN: 13 mg/dL (ref 7–25)
CO2: 24 mmol/L (ref 20–32)
Calcium: 9.1 mg/dL (ref 8.6–10.3)
Chloride: 105 mmol/L (ref 98–110)
Creat: 1.23 mg/dL (ref 0.60–1.26)
Globulin: 3.9 g/dL (calc) — ABNORMAL HIGH (ref 1.9–3.7)
Glucose, Bld: 85 mg/dL (ref 65–99)
Potassium: 4.5 mmol/L (ref 3.5–5.3)
Sodium: 137 mmol/L (ref 135–146)
Total Bilirubin: 0.4 mg/dL (ref 0.2–1.2)
Total Protein: 7.9 g/dL (ref 6.1–8.1)
eGFR: 77 mL/min/{1.73_m2} (ref >=60)

## 2021-05-06 LAB — HIV-1 RNA QUANT-NO REFLEX-BLD
HIV 1 RNA Quant: <20 Copies/mL — ABNORMAL HIGH
HIV-1 RNA Quant, Log: <1.3 Log cps/mL — ABNORMAL HIGH

## 2021-05-06 LAB — FLUORESCENT TREPONEMAL AB(FTA)-IGG-BLD: Fluorescent Treponemal ABS: REACTIVE — AB

## 2021-05-06 LAB — RPR: RPR Ser Ql: REACTIVE — AB

## 2021-05-06 LAB — RPR TITER: RPR Titer: 1:32 {titer} — ABNORMAL HIGH

## 2021-06-11 ENCOUNTER — Ambulatory Visit (INDEPENDENT_AMBULATORY_CARE_PROVIDER_SITE_OTHER): Payer: Medicaid Other | Admitting: Infectious Disease

## 2021-06-11 ENCOUNTER — Other Ambulatory Visit: Payer: Self-pay

## 2021-06-11 ENCOUNTER — Encounter: Payer: Self-pay | Admitting: Infectious Disease

## 2021-06-11 ENCOUNTER — Other Ambulatory Visit (HOSPITAL_COMMUNITY)
Admission: RE | Admit: 2021-06-11 | Discharge: 2021-06-11 | Disposition: A | Discharge status: Home / Self Care | Payer: Medicaid Other | Source: Ambulatory Visit | Attending: Infectious Disease | Admitting: Infectious Disease

## 2021-06-11 VITALS — BP 118/82 | HR 84 | Temp 98.2°F | Wt 210.6 lb

## 2021-06-11 DIAGNOSIS — F341 Dysthymic disorder: Secondary | ICD-10-CM | POA: Diagnosis not present

## 2021-06-11 DIAGNOSIS — B582 Toxoplasma meningoencephalitis: Secondary | ICD-10-CM | POA: Diagnosis not present

## 2021-06-11 DIAGNOSIS — A523 Neurosyphilis, unspecified: Secondary | ICD-10-CM | POA: Diagnosis not present

## 2021-06-11 DIAGNOSIS — Z23 Encounter for immunization: Secondary | ICD-10-CM

## 2021-06-11 DIAGNOSIS — B20 Human immunodeficiency virus [HIV] disease: Secondary | ICD-10-CM

## 2021-06-11 DIAGNOSIS — F0280 Dementia in other diseases classified elsewhere without behavioral disturbance: Secondary | ICD-10-CM

## 2021-06-11 NOTE — Progress Notes (Signed)
Chief complaint: Follow-up for HIV disease on medications he is 1 to move out of his mother's house with his sister at some point   Subjective:    Patient ID: Arthur [name], male    DOB: 1980/10/03, 40 y.o.   MRN: [id number]  HPI   Arthur [name] is a 40 year-old African-American man living with HIV who  fell out of care and was not seen by me since 2015.  He previously failed Atripla with resistance and IK 103 when he is being seen by Dr. Philipp Deputy.  I changed him to a protease-based regimen with Prezista Norvir and Truvada but he had not been seen since 2015.  He was admitted to the hospital with profound encephalopathy.  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  He was also broadly treated for other possible CNS pathology including CMV encephalitis with ganciclovir RI PE for possible tuberculosis with steroids.  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  He was ultimately discharged from the inpatient hospital service to inpatient rehab and completed therapy while there.  Was previously suffering from dysphagia and nutritional deficiency he was given a PEG tube through which she was getting his medications along with nutrition.  He is living at home with his mother and also his sister who is a CMA and have been overseeing his medications.  He has had dramatic improvement in his neurological status and maintained perfect virological control. We retreated him for Neurosyphilis when his titers went up. He had low hgb while on PCN but never verified this prior to completion.  His CD4 is slow to come up.  I added Rukobia in July 2021 in hopes to help increase his CD4 count but he did not tolerate this medication with nausea and vomiting.  He has remained suppressed.  I saw Arthur [name] in March 2022  He had  been having headaches in the left side of his head sometimes wake him up at night.  He  rated them as being about a 7 out of 10 in severity and they are also of a stabbing type of nature.  They ddid  respond to Tylenol and typically resolve within 40 minutes.  .  The hydroxyzine seen does help him sleep  Referred him to neurology who have been working him up for headaches and also has cognitive impairments.  He is also seeing neuropsychology.  His viral load remains suppressed on labs we checked in May but CD4 still is in the 37s and was not coming up very much.  Returns to clinic today with his mother.  In the interim we had to change out his for diazine for clindamycin.  At the last visit I thought it would be a good idea to simplify his toxoplasma secondary prophylaxis by placing him on 1 double strength Bactrim twice daily and getting rid of the pyrimethamine clindamycin and leucovorin.  Would also like to see if rechallenge with [name] with antiemetic therapy may help him.  Is been able to keep taking [name] with premedication.  He has not tried to take it without that and was asking if he should but I do not think is necessary as long as he is continuing to tolerate it with premedication.      Past Medical History:  Diagnosis Date   Anemia    Anxiety    Depression    GERD (gastroesophageal reflux disease)    Headache  Headache syndrome 02/04/2021   HIV infection (HCC)    Insomnia 01/26/2021   Substance abuse (HCC)     Past Surgical History:  Procedure Laterality Date   IR GASTROSTOMY TUBE MOD SED  09/02/2019   IR GASTROSTOMY TUBE REMOVAL  10/28/2019   TOOTH EXTRACTION      Family History  Problem Relation Age of Onset   Diabetes Neg Hx    CAD Neg Hx    Hypertension Neg Hx       Social History   Socioeconomic History   Marital status: Single    Spouse name: Not on file   Number of children: Not on file   Years of education: Not on file   Highest education level: Not on file  Occupational History   Not on file  Tobacco Use   Smoking status:  Every Day    Packs/day: 0.30    Years: 17.00    Pack years: 5.10    Types: Cigarettes   Smokeless tobacco: Never   Tobacco comments:    encouraged to quit  Vaping Use   Vaping Use: Never used  Substance and Sexual Activity   Alcohol use: No    Comment: occassionally   Drug use: Not Currently    Types: Marijuana   Sexual activity: Not Currently    Partners: Male    Comment: declined condoms 09/15/20  Other Topics Concern   Not on file  Social History Narrative   Right Handed   Drinks 2-3 cups caffeine daily   Social Determinants of Health   Financial Resource Strain: Not on file  Food Insecurity: Not on file  Transportation Needs: Not on file  Physical Activity: Not on file  Stress: Not on file  Social Connections: Not on file    No Known Allergies   Current Outpatient Medications:    acetaminophen (TYLENOL) 500 MG tablet, Take 500 mg by mouth every 6 (six) hours as needed., Disp: , Rfl:    atorvastatin (LIPITOR) 20 MG tablet, Take 1 tablet (20 mg total) by mouth daily., Disp: 90 tablet, Rfl: 3   bictegravir-emtricitabine-tenofovir AF (BIKTARVY) 50-200-25 MG TABS tablet, Take 1 tablet by mouth daily., Disp: 30 tablet, Rfl: 11   citalopram (CELEXA) 20 MG tablet, Take 1 tablet (20 mg total) by mouth daily., Disp: 30 tablet, Rfl: 3   divalproex (DEPAKOTE) 250 MG DR tablet, One tablet twice a day for 2 weeks, then take 2 tablets twice a day, Disp: 120 tablet, Rfl: 3   famotidine (PEPCID) 20 MG tablet, Take 1 tablet (20 mg total) by mouth at bedtime., Disp: 30 tablet, Rfl: 11   fostemsavir tromethamine (RUKOBIA) 600 MG TB12 ER tablet, Take 1 tablet (600 mg total) by mouth 2 (two) times daily., Disp: 60 tablet, Rfl: 11   hydrOXYzine (ATARAX/VISTARIL) 25 MG tablet, Take 1 tablet (25 mg total) by mouth at bedtime as needed (insomnia)., Disp: 30 tablet, Rfl: 11   Menthol-Methyl Salicylate (MUSCLE RUB) 10-15 % CREA, Apply 1 application topically as needed for muscle pain., Disp:  ,  Rfl: 0   ondansetron (ZOFRAN) 4 MG tablet, Take 1 tablet (4 mg total) by mouth every 6 (six) hours as needed for nausea., Disp: 120 tablet, Rfl: 4   sulfamethoxazole-trimethoprim (BACTRIM DS) 800-160 MG tablet, Take 1 tablet by mouth 2 (two) times daily. TAKE 1 TABLET BY MOUTH 3 TIMES A WEEK, Disp: 60 tablet, Rfl: 11   SUMAtriptan (IMITREX) 50 MG tablet, Take 1 tablet (50 mg total) by mouth every  2 (two) hours as needed for migraine. May repeat in 2 hours if headache persists or recurs., Disp: 10 tablet, Rfl: 0   terbinafine (LAMISIL) 1 % cream, Apply topically daily. Apply to feet daily, Disp: 30 g, Rfl: 0   Review of Systems  Unable to perform ROS: Dementia  All other systems reviewed and are negative.     Objective:   Physical Exam Constitutional:      Appearance: He is well-developed.  HENT:     Head: Normocephalic and atraumatic.  Eyes:     Conjunctiva/sclera: Conjunctivae normal.  Cardiovascular:     Rate and Rhythm: Normal rate and regular rhythm.  Pulmonary:     Effort: Pulmonary effort is normal. No respiratory distress.     Breath sounds: No wheezing.  Abdominal:     General: There is no distension.     Palpations: Abdomen is soft.  Musculoskeletal:        General: No tenderness. Normal range of motion.     Cervical back: Normal range of motion and neck supple.  Skin:    General: Skin is warm and dry.     Coloration: Skin is not pale.     Findings: No erythema or rash.  Neurological:     Mental Status: He is alert and oriented to person, place, and time.  Psychiatric:        Attention and Perception: Attention normal.        Mood and Affect: Mood normal.        Behavior: Behavior normal.        Cognition and Memory: Memory is impaired.        Judgment: Judgment normal.         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    We will continue BIKTARVY along with [name] twice daily with premedication  Rechecking his viral load his CD4 count CBC differential and CMP today  HIV disease AIDS: Will add Rukobia to his Biktarvy but premedicate him with Zofran.  Hopefully while taking Rukobia we can see his CD4 count go up.  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  Certainly do not think he is going to be able to ever be driving again anytime soon or caring for himsefl independently without formal Neuro and psychiatric evaluation.

## 2021-06-14 ENCOUNTER — Other Ambulatory Visit: Payer: Self-pay

## 2021-06-14 LAB — CBC WITH DIFFERENTIAL/PLATELET
Absolute Monocytes: 500 cells/uL (ref 200–950)
Basophils Absolute: 20 cells/uL (ref 0–200)
Basophils Relative: 0.6 %
Eosinophils Absolute: 279 cells/uL (ref 15–500)
Eosinophils Relative: 8.2 %
HCT: 40.2 % (ref 38.5–50.0)
Hemoglobin: 13.9 g/dL (ref 13.2–17.1)
Lymphs Abs: 1275 cells/uL (ref 850–3900)
MCH: 32.2 pg (ref 27.0–33.0)
MCHC: 34.6 g/dL (ref 32.0–36.0)
MCV: 93.1 fL (ref 80.0–100.0)
MPV: 10 fL (ref 7.5–12.5)
Monocytes Relative: 14.7 %
Neutro Abs: 1326 cells/uL — ABNORMAL LOW (ref 1500–7800)
Neutrophils Relative %: 39 %
Platelets: 295 10*3/uL (ref 140–400)
RBC: 4.32 10*6/uL (ref 4.20–5.80)
RDW: 13.3 % (ref 11.0–15.0)
Total Lymphocyte: 37.5 %
WBC: 3.4 10*3/uL — ABNORMAL LOW (ref 3.8–10.8)

## 2021-06-14 LAB — T-HELPER CELLS (CD4) COUNT (NOT AT ARMC)
Absolute CD4: 92 cells/uL — ABNORMAL LOW (ref 490–1740)
CD4 T Helper %: 8 % — ABNORMAL LOW (ref 30–61)
Total lymphocyte count: 1193 cells/uL (ref 850–3900)

## 2021-06-14 LAB — URINE CYTOLOGY ANCILLARY ONLY
Chlamydia: NEGATIVE
Comment: NEGATIVE
Comment: NORMAL
Neisseria Gonorrhea: NEGATIVE

## 2021-06-14 LAB — COMPLETE METABOLIC PANEL WITH GFR
AG Ratio: 1.1 (calc) (ref 1.0–2.5)
ALT: 15 U/L (ref 9–46)
AST: 20 U/L (ref 10–40)
Albumin: 4.2 g/dL (ref 3.6–5.1)
Alkaline phosphatase (APISO): 57 U/L (ref 36–130)
BUN: 9 mg/dL (ref 7–25)
CO2: 27 mmol/L (ref 20–32)
Calcium: 9.1 mg/dL (ref 8.6–10.3)
Chloride: 104 mmol/L (ref 98–110)
Creat: 1.19 mg/dL (ref 0.60–1.26)
Globulin: 3.7 g/dL (calc) (ref 1.9–3.7)
Glucose, Bld: 94 mg/dL (ref 65–99)
Potassium: 4.7 mmol/L (ref 3.5–5.3)
Sodium: 138 mmol/L (ref 135–146)
Total Bilirubin: 0.3 mg/dL (ref 0.2–1.2)
Total Protein: 7.9 g/dL (ref 6.1–8.1)
eGFR: 80 mL/min/{1.73_m2} (ref >=60)

## 2021-06-14 LAB — HIV-1 RNA QUANT-NO REFLEX-BLD
HIV 1 RNA Quant: <20 Copies/mL — ABNORMAL HIGH
HIV-1 RNA Quant, Log: <1.3 Log cps/mL — ABNORMAL HIGH

## 2021-06-14 MED ORDER — DIVALPROEX SODIUM 250 MG PO DR TAB: DELAYED_RELEASE_TABLET | ORAL | 1 refills | Status: DC

## 2021-08-13 ENCOUNTER — Other Ambulatory Visit (HOSPITAL_COMMUNITY)
Admission: RE | Admit: 2021-08-13 | Discharge: 2021-08-13 | Disposition: A | Discharge status: Home / Self Care | Payer: Medicaid Other | Source: Ambulatory Visit | Attending: Infectious Disease | Admitting: Infectious Disease

## 2021-08-13 ENCOUNTER — Other Ambulatory Visit: Payer: Self-pay

## 2021-08-13 ENCOUNTER — Ambulatory Visit (INDEPENDENT_AMBULATORY_CARE_PROVIDER_SITE_OTHER): Payer: Medicaid Other | Admitting: Infectious Disease

## 2021-08-13 ENCOUNTER — Ambulatory Visit (INDEPENDENT_AMBULATORY_CARE_PROVIDER_SITE_OTHER): Payer: Medicaid Other

## 2021-08-13 ENCOUNTER — Other Ambulatory Visit: Payer: Self-pay | Admitting: Neurology

## 2021-08-13 ENCOUNTER — Encounter: Payer: Self-pay | Admitting: Infectious Disease

## 2021-08-13 VITALS — HR 91 | Temp 98.0°F | Wt 216.0 lb

## 2021-08-13 DIAGNOSIS — Z23 Encounter for immunization: Secondary | ICD-10-CM

## 2021-08-13 DIAGNOSIS — A523 Neurosyphilis, unspecified: Secondary | ICD-10-CM

## 2021-08-13 DIAGNOSIS — B20 Human immunodeficiency virus [HIV] disease: Secondary | ICD-10-CM | POA: Insufficient documentation

## 2021-08-13 DIAGNOSIS — B582 Toxoplasma meningoencephalitis: Secondary | ICD-10-CM

## 2021-08-13 DIAGNOSIS — G969 Disorder of central nervous system, unspecified: Secondary | ICD-10-CM

## 2021-08-13 DIAGNOSIS — Z113 Encounter for screening for infections with a predominantly sexual mode of transmission: Secondary | ICD-10-CM | POA: Diagnosis not present

## 2021-08-13 DIAGNOSIS — Z7185 Encounter for immunization safety counseling: Secondary | ICD-10-CM

## 2021-08-13 HISTORY — DX: Human immunodeficiency virus (HIV) disease: B20

## 2021-08-13 HISTORY — DX: Encounter for immunization safety counseling: Z71.85

## 2021-08-13 MED ORDER — BIKTARVY 50-200-25 MG PO TABS: 1.0000 | ORAL_TABLET | Freq: Every day | ORAL | 11 refills | Status: DC

## 2021-08-13 MED ORDER — RUKOBIA 600 MG PO TB12: 600.0000 mg | ORAL_TABLET | Freq: Two times a day (BID) | ORAL | 11 refills | Status: DC

## 2021-08-13 MED ORDER — SULFAMETHOXAZOLE-TRIMETHOPRIM 800-160 MG PO TABS: 1.0000 | ORAL_TABLET | Freq: Two times a day (BID) | ORAL | 11 refills | Status: DC

## 2021-08-13 NOTE — Progress Notes (Signed)
Chief complaint: follow up for HIV on medications   Subjective:    Patient ID: Arthur [name], male    DOB: 1981/03/25, 40 y.o.   MRN: [id number]  HPI   Arthur [name] is a 40 year-old African-American man living with HIV who  fell out of care and was not seen by me since 2015.  He previously failed Atripla with resistance and IK 103 when he is being seen by Dr. Philipp Deputy.  I changed him to a protease-based regimen with Prezista Norvir and Truvada but he had not been seen since 2015.  He was admitted to the hospital with profound encephalopathy.  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  He was also broadly treated for other possible CNS pathology including CMV encephalitis with ganciclovir RI PE for possible tuberculosis with steroids.  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  He was ultimately discharged from the inpatient hospital service to inpatient rehab and completed therapy while there.  Was previously suffering from dysphagia and nutritional deficiency he was given a PEG tube through which she was getting his medications along with nutrition.  He is living at home with his mother and also his sister who is a CMA and have been overseeing his medications.  He has had dramatic improvement in his neurological status and maintained perfect virological control. We retreated him for Neurosyphilis when his titers went up. He had low hgb while on PCN but never verified this prior to completion.  His CD4 is slow to come up.  I added Rukobia in July 2021 in hopes to help increase his CD4 count but he did not tolerate this medication with nausea and vomiting.  He has remained suppressed.  I saw Arthur [name] in March 2022  He had  been having headaches in the left side of his head sometimes wake him up at night.  He rated them as being about a 7 out of 10 in severity and they are also of a  stabbing type of nature.  They ddid  respond to Tylenol and typically resolve within 40 minutes.  .  The hydroxyzine seen does help him sleep  Referred him to neurology who have been working him up for headaches and also has cognitive impairments.  He is also seeing neuropsychology.  His viral load remained suppressed on labs we checked in May but CD4 still is in the 13s and was not coming up very much.   In the interim we had to change out his sulfadiazine for clindamycin.  At the last visit I thought it would be a good idea to simplify his toxoplasma secondary prophylaxis by placing him on 1 double strength Bactrim twice daily and getting rid of the pyrimethamine clindamycin and leucovorin.  [place] also decided to re challenge him  with [name] with antiemetic therapy may help him.  He has been able to keep the [name] down with premedication.  He comes to clinic today for follow-up.  His mother could not accompany him today.  He was agreeable to getting vaccinated with updated boosted COVID-vaccine      Past Medical History:  Diagnosis Date   Anemia    Anxiety    Depression    GERD (gastroesophageal reflux disease)    Headache    Headache syndrome 02/04/2021   HIV infection (HCC)    Insomnia 01/26/2021   Substance abuse (HCC)     Past Surgical History:  Procedure Laterality Date   IR GASTROSTOMY TUBE MOD SED  09/02/2019   IR GASTROSTOMY TUBE REMOVAL  10/28/2019   TOOTH EXTRACTION      Family History  Problem Relation Age of Onset   Diabetes Neg Hx    CAD Neg Hx    Hypertension Neg Hx       Social History   Socioeconomic History   Marital status: Single    Spouse name: Not on file   Number of children: Not on file   Years of education: Not on file   Highest education level: Not on file  Occupational History   Not on file  Tobacco Use   Smoking status: Every Day    Packs/day: 0.30    Years: 17.00    Pack years: 5.10    Types: Cigarettes   Smokeless tobacco:  Never   Tobacco comments:    encouraged to quit  Vaping Use   Vaping Use: Never used  Substance and Sexual Activity   Alcohol use: No    Comment: occassionally   Drug use: Not Currently    Types: Marijuana   Sexual activity: Not Currently    Partners: Male    Comment: declined condoms 09/15/20  Other Topics Concern   Not on file  Social History Narrative   Right Handed   Drinks 2-3 cups caffeine daily   Social Determinants of Health   Financial Resource Strain: Not on file  Food Insecurity: Not on file  Transportation Needs: Not on file  Physical Activity: Not on file  Stress: Not on file  Social Connections: Not on file    No Known Allergies   Current Outpatient Medications:    acetaminophen (TYLENOL) 500 MG tablet, Take 500 mg by mouth every 6 (six) hours as needed., Disp: , Rfl:    atorvastatin (LIPITOR) 20 MG tablet, Take 1 tablet (20 mg total) by mouth daily., Disp: 90 tablet, Rfl: 3   bictegravir-emtricitabine-tenofovir AF (BIKTARVY) 50-200-25 MG TABS tablet, Take 1 tablet by mouth daily., Disp: 30 tablet, Rfl: 11   citalopram (CELEXA) 20 MG tablet, Take 1 tablet (20 mg total) by mouth daily., Disp: 30 tablet, Rfl: 3   divalproex (DEPAKOTE) 250 MG DR tablet, 2 tablets twice a day, Disp: 120 tablet, Rfl: 1   famotidine (PEPCID) 20 MG tablet, Take 1 tablet (20 mg total) by mouth at bedtime., Disp: 30 tablet, Rfl: 11   fostemsavir tromethamine (RUKOBIA) 600 MG TB12 ER tablet, Take 1 tablet (600 mg total) by mouth 2 (two) times daily., Disp: 60 tablet, Rfl: 11   hydrOXYzine (ATARAX/VISTARIL) 25 MG tablet, Take 1 tablet (25 mg total) by mouth at bedtime as needed (insomnia)., Disp: 30 tablet, Rfl: 11   Menthol-Methyl Salicylate (MUSCLE RUB) 10-15 % CREA, Apply 1 application topically as needed for muscle pain., Disp:  , Rfl: 0   ondansetron (ZOFRAN) 4 MG tablet, Take 1 tablet (4 mg total) by mouth every 6 (six) hours as needed for nausea., Disp: 120 tablet, Rfl: 4    sulfamethoxazole-trimethoprim (BACTRIM DS) 800-160 MG tablet, Take 1 tablet by mouth 2 (two) times daily. TAKE 1 TABLET BY MOUTH 3 TIMES A WEEK, Disp: 60 tablet, Rfl: 11   SUMAtriptan (IMITREX) 50 MG tablet, Take 1 tablet (50 mg total) by mouth every 2 (two) hours as needed for migraine. May repeat in 2 hours if headache persists or recurs., Disp: 10 tablet, Rfl: 0   terbinafine (LAMISIL) 1 % cream, Apply topically daily. Apply to feet daily,  Disp: 30 g, Rfl: 0   Review of Systems  Unable to perform ROS: Dementia  All other systems reviewed and are negative.     Objective:   Physical Exam Constitutional:      Appearance: He is well-developed. He is obese.  HENT:     Head: Normocephalic and atraumatic.  Eyes:     Conjunctiva/sclera: Conjunctivae normal.  Cardiovascular:     Rate and Rhythm: Normal rate and regular rhythm.  Pulmonary:     Effort: Pulmonary effort is normal. No respiratory distress.     Breath sounds: No wheezing.  Abdominal:     General: There is no distension.     Palpations: Abdomen is soft.  Musculoskeletal:        General: No tenderness. Normal range of motion.     Cervical back: Normal range of motion and neck supple.  Skin:    General: Skin is warm and dry.     Coloration: Skin is not pale.     Findings: No erythema or rash.  Neurological:     Mental Status: He is alert and oriented to person, place, and time.  Psychiatric:        Attention and Perception: Attention normal.        Speech: Speech is delayed.        Behavior: Behavior normal.        Thought Content: Thought content normal.        Cognition and Memory: He exhibits impaired recent memory and impaired remote memory.        Judgment: Judgment normal.         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 Vaccine counseling recommended COVID-19 booster which he received

## 2021-08-13 NOTE — Progress Notes (Signed)
   Covid-19 Vaccination Clinic  Name:  RYDELL WIEGEL    MRN: [id number] DOB: [birth year]  08/13/2021  Mr. Covington was observed post Covid-19 immunization for 15 minutes without incident. He was provided with Vaccine Information Sheet and instruction to access the V-Safe system.   Mr. Szafranski was instructed to call 911 with any severe reactions post vaccine: Difficulty breathing  Swelling of face and throat  A fast heartbeat  A bad rash all over body  Dizziness and weakness   Immunizations Administered     Name Date Dose VIS Date Route   Pfizer Covid-19 Vaccine Bivalent Booster 08/13/2021 11:28 AM 0.3 mL 05/26/2021 Intramuscular   Manufacturer: [employer], [employer]   Lot: [id number]   NDC: [address]-6      Juanita Laster, RMA

## 2021-08-16 LAB — URINE CYTOLOGY ANCILLARY ONLY
Chlamydia: NEGATIVE
Comment: NEGATIVE
Comment: NORMAL
Neisseria Gonorrhea: NEGATIVE

## 2021-08-17 LAB — CBC WITH DIFFERENTIAL/PLATELET
Absolute Monocytes: 464 cells/uL (ref 200–950)
Basophils Absolute: 20 cells/uL (ref 0–200)
Basophils Relative: 0.5 %
Eosinophils Absolute: 374 cells/uL (ref 15–500)
Eosinophils Relative: 9.6 %
HCT: 44.4 % (ref 38.5–50.0)
Hemoglobin: 15.1 g/dL (ref 13.2–17.1)
Lymphs Abs: 1661 cells/uL (ref 850–3900)
MCH: 32.1 pg (ref 27.0–33.0)
MCHC: 34 g/dL (ref 32.0–36.0)
MCV: 94.3 fL (ref 80.0–100.0)
MPV: 9.8 fL (ref 7.5–12.5)
Monocytes Relative: 11.9 %
Neutro Abs: 1381 cells/uL — ABNORMAL LOW (ref 1500–7800)
Neutrophils Relative %: 35.4 %
Platelets: 327 10*3/uL (ref 140–400)
RBC: 4.71 10*6/uL (ref 4.20–5.80)
RDW: 13.5 % (ref 11.0–15.0)
Total Lymphocyte: 42.6 %
WBC: 3.9 10*3/uL (ref 3.8–10.8)

## 2021-08-17 LAB — T-HELPER CELLS (CD4) COUNT (NOT AT ARMC)
Absolute CD4: 136 cells/uL — ABNORMAL LOW (ref 490–1740)
CD4 T Helper %: 7 % — ABNORMAL LOW (ref 30–61)
Total lymphocyte count: 1822 cells/uL (ref 850–3900)

## 2021-08-17 LAB — RPR TITER: RPR Titer: 1:32 {titer} — ABNORMAL HIGH

## 2021-08-17 LAB — COMPLETE METABOLIC PANEL WITH GFR
AG Ratio: 1 (calc) (ref 1.0–2.5)
ALT: 16 U/L (ref 9–46)
AST: 19 U/L (ref 10–40)
Albumin: 4.2 g/dL (ref 3.6–5.1)
Alkaline phosphatase (APISO): 55 U/L (ref 36–130)
BUN/Creatinine Ratio: 9 (calc) (ref 6–22)
BUN: 12 mg/dL (ref 7–25)
CO2: 20 mmol/L (ref 20–32)
Calcium: 9.8 mg/dL (ref 8.6–10.3)
Chloride: 104 mmol/L (ref 98–110)
Creat: 1.28 mg/dL — ABNORMAL HIGH (ref 0.60–1.26)
Globulin: 4.2 g/dL (calc) — ABNORMAL HIGH (ref 1.9–3.7)
Glucose, Bld: 86 mg/dL (ref 65–99)
Potassium: 4.4 mmol/L (ref 3.5–5.3)
Sodium: 137 mmol/L (ref 135–146)
Total Bilirubin: 0.3 mg/dL (ref 0.2–1.2)
Total Protein: 8.4 g/dL — ABNORMAL HIGH (ref 6.1–8.1)
eGFR: 73 mL/min/{1.73_m2} (ref >=60)

## 2021-08-17 LAB — HIV-1 RNA QUANT-NO REFLEX-BLD
HIV 1 RNA Quant: 42 Copies/mL — ABNORMAL HIGH
HIV-1 RNA Quant, Log: 1.63 Log cps/mL — ABNORMAL HIGH

## 2021-08-17 LAB — FLUORESCENT TREPONEMAL AB(FTA)-IGG-BLD: Fluorescent Treponemal ABS: REACTIVE — AB

## 2021-08-17 LAB — RPR: RPR Ser Ql: REACTIVE — AB

## 2021-09-10 ENCOUNTER — Other Ambulatory Visit: Payer: Self-pay | Admitting: Neurology

## 2021-09-22 ENCOUNTER — Telehealth: Payer: Self-pay | Admitting: Neurology

## 2021-09-22 ENCOUNTER — Other Ambulatory Visit: Payer: Self-pay | Admitting: Neurology

## 2021-09-22 MED ORDER — DIVALPROEX SODIUM 250 MG PO DR TAB: DELAYED_RELEASE_TABLET | ORAL | 5 refills | Status: DC

## 2021-09-22 NOTE — Telephone Encounter (Signed)
Refill sent for the patient to the pharmacy requested. Has a upcoming apt in may with Maralyn Sago NP

## 2021-09-22 NOTE — Telephone Encounter (Signed)
Refill has been sent for the pt to the pharmacy as requested. Has a upcoming apt with sarah, np

## 2021-09-22 NOTE — Telephone Encounter (Signed)
Pt's mother called requesting refill for divalproex (DEPAKOTE) 250 MG DR tablet. Pharmacy [facility] [phone] [facility] [place], [place] - [address]

## 2021-09-24 ENCOUNTER — Telehealth: Payer: Self-pay

## 2021-09-24 ENCOUNTER — Other Ambulatory Visit (HOSPITAL_COMMUNITY): Payer: Self-pay

## 2021-09-24 NOTE — Telephone Encounter (Signed)
[facility] Patient Advocate Encounter  Prior Authorization for Arthur [name] has been approved.    PA# [id number] Effective dates: 09/24/21 through 09/24/22  Patients co-pay is $4.00.   [facility] Clinic will continue to follow.  Clearance Coots, CPhT Specialty Pharmacy Patient [name] Phone: [phone] Fax:  [phone]

## 2021-09-24 NOTE — Telephone Encounter (Signed)
[facility] Patient Advocate Encounter   Received notification from [employer]  that prior authorization for Rukobia is required.   PA submitted on 09/24/21 Key [id number] Status is pending    [facility] Clinic will continue to follow.   Clearance Coots, CPhT Specialty Pharmacy Patient [facility] Phone: [phone] Fax:  [phone]

## 2021-10-27 ENCOUNTER — Ambulatory Visit: Payer: Medicaid Other | Admitting: Infectious Disease

## 2021-11-07 NOTE — Progress Notes (Signed)
Chief complaint: f apparently Arthur [name] has had worsening ataxia recently having to use his cane when he walks where he had not needed that before also sometimes waking up with more slurred speech in the mornings   Subjective:    Patient ID: Arthur ParrMichael J [name], male    DOB: 10/01/1980, 41 y.o.   MRN: [id number]  HPI   Arthur [name] is a 41 year-old African-American man living with HIV who  fell out of care and was not seen by me since 2015.  He previously failed Atripla with resistance and IK 103 when he is being seen by Dr. Philipp DeputyVollmer.  I changed him to a protease-based regimen with Prezista Norvir and Truvada but he had not been seen since 2015.  He was admitted to the hospital with profound encephalopathy.  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  He was also broadly treated for other possible CNS pathology including CMV encephalitis with ganciclovir RI PE for possible tuberculosis with steroids.  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  He was ultimately discharged from the inpatient hospital service to inpatient rehab and completed therapy while there.  Was previously suffering from dysphagia and nutritional deficiency he was given a PEG tube through which she was getting his medications along with nutrition.  He is living at home with his mother and also his sister who is a CMA and have been overseeing his medications.  He has had dramatic improvement in his neurological status and maintained perfect virological control. We retreated him for Neurosyphilis when his titers went up. He had low hgb while on PCN but never verified this prior to completion.  His CD4 is slow to come up.  I added Rukobia in July 2021 in hopes to help increase his CD4 count but he did not tolerate this medication with nausea and vomiting.  He has remained suppressed.  I saw Arthur [name] in March 2022  He had  been  having headaches in the left side of his head sometimes wake him up at night.  He rated them as being about a 7 out of 10 in severity and they are also of a stabbing type of nature.  They ddid  respond to Tylenol and typically resolve within 40 minutes.  .  The hydroxyzine seen does help him sleep  Referred him to neurology who have been working him up for headaches and also has cognitive impairments.  He is also seeing neuropsychology.  His viral load remained suppressed on labs we checked in May but CD4 still is in the 450s and was not coming up very much.   In the interim we had to change out his sulfadiazine for clindamycin.  Several visits ago I thought it would be good idea to simplify his toxoplasma secondary prophylaxis by placing him on 1 double strength Bactrim twice daily and getting rid of the pyrimethamine clindamycin and leucovorin.  WI also decided to re challenge him  with [place]obia with antiemetic therapy may help him.  He has been able to keep the [place]obia down with premedication.  Yacqub's viral load remains suppressed and CD4 count came up a bit.  Since I saw him last he is having more problems with balance and unable to walk without using his cane where he was able to do this before.  He also sometimes wakes up in the morning with speech that is more difficult to understand according to mom  accompanies him to visit today.  He drinks alcohol only when his sister is in town and this is not frequently.  He does smoke marijuana daily basis and we discussed whether or not this could be a potential confounder in terms of his ataxia and potential speech changes neurological findings.        Past Medical History:  Diagnosis Date   Anemia    Anxiety    Depression    GERD (gastroesophageal reflux disease)    Headache    Headache syndrome 02/04/2021   HIV infection (HCC)    HIV infection with neurological disease (HCC) 08/13/2021   Insomnia 01/26/2021   Substance abuse  (HCC)    Vaccine counseling 08/13/2021    Past Surgical History:  Procedure Laterality Date   IR GASTROSTOMY TUBE MOD SED  09/02/2019   IR GASTROSTOMY TUBE REMOVAL  10/28/2019   TOOTH EXTRACTION      Family History  Problem Relation Age of Onset   Diabetes Neg Hx    CAD Neg Hx    Hypertension Neg Hx       Social History   Socioeconomic History   Marital status: Single    Spouse name: Not on file   Number of children: Not on file   Years of education: Not on file   Highest education level: Not on file  Occupational History   Not on file  Tobacco Use   Smoking status: Every Day    Packs/day: 0.30    Years: 17.00    Pack years: 5.10    Types: Cigarettes   Smokeless tobacco: Never   Tobacco comments:    encouraged to quit  Vaping Use   Vaping Use: Never used  Substance and Sexual Activity   Alcohol use: No    Comment: occassionally   Drug use: Not Currently    Types: Marijuana   Sexual activity: Not Currently    Partners: Male    Comment: declined condoms 09/15/20  Other Topics Concern   Not on file  Social History Narrative   Right Handed   Drinks 2-3 cups caffeine daily   Social Determinants of Health   Financial Resource Strain: Not on file  Food Insecurity: Not on file  Transportation Needs: Not on file  Physical Activity: Not on file  Stress: Not on file  Social Connections: Not on file    No Known Allergies   Current Outpatient Medications:    acetaminophen (TYLENOL) 500 MG tablet, Take 500 mg by mouth every 6 (six) hours as needed., Disp: , Rfl:    atorvastatin (LIPITOR) 20 MG tablet, Take 1 tablet (20 mg total) by mouth daily., Disp: 90 tablet, Rfl: 3   bictegravir-emtricitabine-tenofovir AF (BIKTARVY) 50-200-25 MG TABS tablet, Take 1 tablet by mouth daily., Disp: 30 tablet, Rfl: 11   citalopram (CELEXA) 20 MG tablet, Take 1 tablet (20 mg total) by mouth daily., Disp: 30 tablet, Rfl: 3   divalproex (DEPAKOTE) 250 MG DR tablet, TAKE 2 TABLETS BY  MOUTH TWICE DAILY, Disp: 120 tablet, Rfl: 5   famotidine (PEPCID) 20 MG tablet, Take 1 tablet (20 mg total) by mouth at bedtime., Disp: 30 tablet, Rfl: 11   fostemsavir tromethamine (RUKOBIA) 600 MG TB12 ER tablet, Take 1 tablet (600 mg total) by mouth 2 (two) times daily., Disp: 60 tablet, Rfl: 11   hydrOXYzine (ATARAX/VISTARIL) 25 MG tablet, Take 1 tablet (25 mg total) by mouth at bedtime as needed (insomnia)., Disp: 30 tablet, Rfl: 11   Menthol-Methyl  Salicylate (MUSCLE RUB) 10-15 % CREA, Apply 1 application topically as needed for muscle pain., Disp:  , Rfl: 0   ondansetron (ZOFRAN) 4 MG tablet, Take 1 tablet (4 mg total) by mouth every 6 (six) hours as needed for nausea., Disp: 120 tablet, Rfl: 4   sulfamethoxazole-trimethoprim (BACTRIM DS) 800-160 MG tablet, Take 1 tablet by mouth 2 (two) times daily. TAKE 1 TABLET BY MOUTH 3 TIMES A WEEK, Disp: 60 tablet, Rfl: 11   SUMAtriptan (IMITREX) 50 MG tablet, Take 1 tablet (50 mg total) by mouth every 2 (two) hours as needed for migraine. May repeat in 2 hours if headache persists or recurs., Disp: 10 tablet, Rfl: 0   terbinafine (LAMISIL) 1 % cream, Apply topically daily. Apply to feet daily, Disp: 30 g, Rfl: 0   Review of Systems  Unable to perform ROS: Dementia      Objective:   Physical Exam Constitutional:      Appearance: He is well-developed.  HENT:     Head: Normocephalic and atraumatic.  Eyes:     Conjunctiva/sclera: Conjunctivae normal.  Cardiovascular:     Rate and Rhythm: Normal rate and regular rhythm.  Pulmonary:     Effort: Pulmonary effort is normal. No respiratory distress.     Breath sounds: No wheezing.  Abdominal:     General: There is no distension.     Palpations: Abdomen is soft.  Musculoskeletal:        General: No tenderness. Normal range of motion.     Cervical back: Normal range of motion and neck supple.  Skin:    General: Skin is warm and dry.     Coloration: Skin is not pale.     Findings: No erythema  or rash.  Neurological:     Mental Status: He is alert and oriented to person, place, and time.  Psychiatric:        Behavior: Behavior normal.        Thought Content: Thought content normal.        Judgment: Judgment normal.         Assessment & Plan:    HIV/AIDS:  I am rechecking VL and CD4, CBC w diff, CMP w GFR  I will continue his Biktarvy and BID Rukobia  Lab Results  Component Value Date   HIV1RNAQUANT 42 (H) 08/13/2021   Lab Results  Component Value Date   CD4TABS 86 (L) 05/04/2021   CD4TABS 57 (L) 01/26/2021   CD4TABS 54 (L) 09/15/2020     CNS toxoplasmosis: continue Bactrim DS bid  Worsening ataxia: I am Ordering an MRI of the brain with and without contrast.  He may need referral to [facility] again.  I doubt that he would have a new CNS infection.  Neurosyphilis: Titers have come down to stable value of 1:32   Hyperlipidemia: We will check lipids and continue Lipitor Recheck lipid panel  Weight gain on integrase strand transfer inhibitor could consider ACTG study possibly though he has cognitive issues and I would think he would have some exclusionary criteria.  Not terribly excited about trying to change him off of integrase strand transfer inhibitor at this point in time of its potency and maintaining virological suppression and fact taht CD4 tend to rise higher with INSTI though likely more through text on viral load dropping more rapidly.  Vaccine counseling recommended Prevnar 20 which she received today.  I spent 42 minutes with the patient including than 50% of the time in face to face  counseling of the patient and his mother regarding his HIV has CNS toxoplasmosis his ataxia personally her MRI from April 2020 which had shown improvement in his lesions,  along with review of medical records in preparation for the visit and during the visit and in coordination of his care.

## 2021-11-08 ENCOUNTER — Encounter: Payer: Self-pay | Admitting: Infectious Disease

## 2021-11-08 ENCOUNTER — Other Ambulatory Visit: Payer: Self-pay

## 2021-11-08 ENCOUNTER — Ambulatory Visit (INDEPENDENT_AMBULATORY_CARE_PROVIDER_SITE_OTHER): Payer: Medicaid Other | Admitting: Infectious Disease

## 2021-11-08 ENCOUNTER — Ambulatory Visit: Payer: Medicaid Other

## 2021-11-08 VITALS — BP 112/75 | HR 78 | Temp 98.3°F | Ht 71.0 in | Wt 210.0 lb

## 2021-11-08 DIAGNOSIS — R270 Ataxia, unspecified: Secondary | ICD-10-CM

## 2021-11-08 DIAGNOSIS — B582 Toxoplasma meningoencephalitis: Secondary | ICD-10-CM | POA: Diagnosis not present

## 2021-11-08 DIAGNOSIS — R635 Abnormal weight gain: Secondary | ICD-10-CM | POA: Diagnosis not present

## 2021-11-08 DIAGNOSIS — Z23 Encounter for immunization: Secondary | ICD-10-CM

## 2021-11-08 DIAGNOSIS — A523 Neurosyphilis, unspecified: Secondary | ICD-10-CM

## 2021-11-08 DIAGNOSIS — B20 Human immunodeficiency virus [HIV] disease: Secondary | ICD-10-CM | POA: Diagnosis not present

## 2021-11-08 DIAGNOSIS — R9089 Other abnormal findings on diagnostic imaging of central nervous system: Secondary | ICD-10-CM | POA: Diagnosis not present

## 2021-11-08 DIAGNOSIS — G969 Disorder of central nervous system, unspecified: Secondary | ICD-10-CM

## 2021-11-08 HISTORY — DX: Ataxia, unspecified: R27.0

## 2021-11-08 NOTE — Addendum Note (Signed)
Addended by: Primus Bravo E on: 11/08/2021 02:08 PM   Modules accepted: Orders

## 2021-11-10 LAB — CBC WITH DIFFERENTIAL/PLATELET
Absolute Monocytes: 423 cells/uL (ref 200–950)
Basophils Absolute: 28 cells/uL (ref 0–200)
Basophils Relative: 0.6 %
Eosinophils Absolute: 503 cells/uL — ABNORMAL HIGH (ref 15–500)
Eosinophils Relative: 10.7 %
HCT: 45.7 % (ref 38.5–50.0)
Hemoglobin: 15.7 g/dL (ref 13.2–17.1)
Lymphs Abs: 2049 cells/uL (ref 850–3900)
MCH: 32.1 pg (ref 27.0–33.0)
MCHC: 34.4 g/dL (ref 32.0–36.0)
MCV: 93.5 fL (ref 80.0–100.0)
MPV: 9.6 fL (ref 7.5–12.5)
Monocytes Relative: 9 %
Neutro Abs: 1697 cells/uL (ref 1500–7800)
Neutrophils Relative %: 36.1 %
Platelets: 360 10*3/uL (ref 140–400)
RBC: 4.89 10*6/uL (ref 4.20–5.80)
RDW: 13.7 % (ref 11.0–15.0)
Total Lymphocyte: 43.6 %
WBC: 4.7 10*3/uL (ref 3.8–10.8)

## 2021-11-10 LAB — LIPID PANEL
Cholesterol: 250 mg/dL — ABNORMAL HIGH (ref <200)
HDL: 39 mg/dL — ABNORMAL LOW (ref >=40)
LDL Cholesterol (Calc): 183 mg/dL (calc) — ABNORMAL HIGH
Non-HDL Cholesterol (Calc): 211 mg/dL (calc) — ABNORMAL HIGH (ref <130)
Total CHOL/HDL Ratio: 6.4 (calc) — ABNORMAL HIGH (ref <5.0)
Triglycerides: 138 mg/dL (ref <150)

## 2021-11-10 LAB — COMPLETE METABOLIC PANEL WITH GFR
AG Ratio: 0.9 (calc) — ABNORMAL LOW (ref 1.0–2.5)
ALT: 10 U/L (ref 9–46)
AST: 14 U/L (ref 10–40)
Albumin: 4.2 g/dL (ref 3.6–5.1)
Alkaline phosphatase (APISO): 57 U/L (ref 36–130)
BUN/Creatinine Ratio: 11 (calc) (ref 6–22)
BUN: 15 mg/dL (ref 7–25)
CO2: 25 mmol/L (ref 20–32)
Calcium: 9.6 mg/dL (ref 8.6–10.3)
Chloride: 104 mmol/L (ref 98–110)
Creat: 1.34 mg/dL — ABNORMAL HIGH (ref 0.60–1.26)
Globulin: 4.5 g/dL (calc) — ABNORMAL HIGH (ref 1.9–3.7)
Glucose, Bld: 88 mg/dL (ref 65–99)
Potassium: 4.7 mmol/L (ref 3.5–5.3)
Sodium: 137 mmol/L (ref 135–146)
Total Bilirubin: 0.3 mg/dL (ref 0.2–1.2)
Total Protein: 8.7 g/dL — ABNORMAL HIGH (ref 6.1–8.1)
eGFR: 70 mL/min/{1.73_m2} (ref >=60)

## 2021-11-10 LAB — HIV-1 RNA QUANT-NO REFLEX-BLD
HIV 1 RNA Quant: 20 Copies/mL — ABNORMAL HIGH
HIV-1 RNA Quant, Log: 1.3 Log cps/mL — ABNORMAL HIGH

## 2021-11-10 LAB — RPR: RPR Ser Ql: REACTIVE — AB

## 2021-11-10 LAB — C. TRACHOMATIS/N. GONORRHOEAE RNA
C. trachomatis RNA, TMA: NOT DETECTED
N. gonorrhoeae RNA, TMA: NOT DETECTED

## 2021-11-10 LAB — RPR TITER: RPR Titer: 1:32 {titer} — ABNORMAL HIGH

## 2021-11-10 LAB — FLUORESCENT TREPONEMAL AB(FTA)-IGG-BLD: Fluorescent Treponemal ABS: REACTIVE — AB

## 2021-11-13 ENCOUNTER — Ambulatory Visit (HOSPITAL_COMMUNITY)
Admission: RE | Admit: 2021-11-13 | Discharge: 2021-11-13 | Disposition: A | Discharge status: Home / Self Care | Payer: Medicaid Other | Source: Ambulatory Visit | Attending: Infectious Disease | Admitting: Infectious Disease

## 2021-11-13 DIAGNOSIS — G319 Degenerative disease of nervous system, unspecified: Secondary | ICD-10-CM | POA: Diagnosis not present

## 2021-11-13 DIAGNOSIS — R270 Ataxia, unspecified: Secondary | ICD-10-CM

## 2021-11-13 IMAGING — MR MR HEAD WO/W CM
13 series · 48 of 48 positions shown · IV contrast (gadavist)
Comparison: [DATE]

CLINICAL DATA: Neuro deficit, acute, stroke suspected.  Ataxia.

EXAM:
MRI HEAD WITHOUT AND WITH CONTRAST
TECHNIQUE: Multiplanar, multiecho pulse sequences of the brain and surrounding
structures were obtained without and with intravenous contrast.
CONTRAST:  9mL GADAVIST GADOBUTROL 1 MMOL/ML IV SOLN

[Series 5: DWI · axial · 3.0mm · 1.36mm/px · z∈[-96,+72]mm · 8 of 115 slices shown (1 of 2)]
[im 1/115]
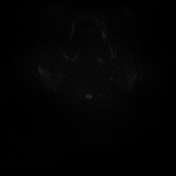
[im 17/115]
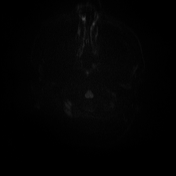
[im 33/115]
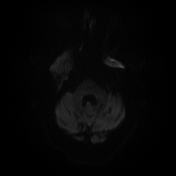
[im 49/115]
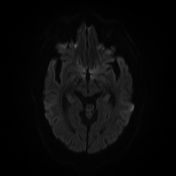
[im 66/115]
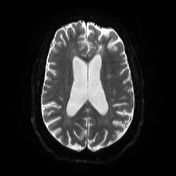
[im 82/115]
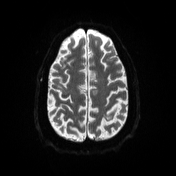
[im 98/115]
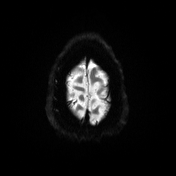
[im 115/115]
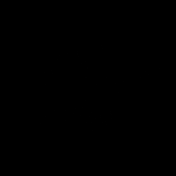

[Series 6: DWI · axial · 3.0mm · 1.36mm/px · z∈[-96,+69]mm · 3 of 57 slices shown (2 of 2)]
[im 1/57]
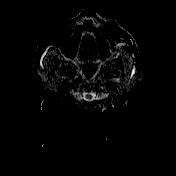
[im 29/57]
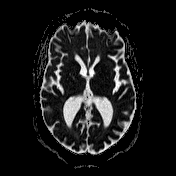
[im 57/57]
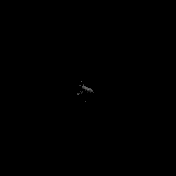

[Series 7: T1 · sagittal · 5.0mm · 0.75mm/px · 1 of 26 slices shown (1 of 2)]
[im 1/26]
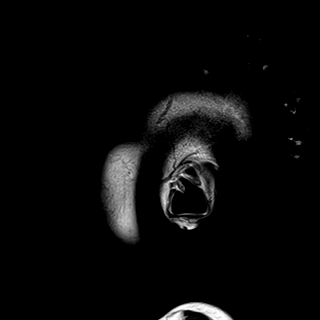

[Series 8: T2 · axial · 5.0mm · 0.62mm/px · 1 of 26 slices shown]
[im 1/26]
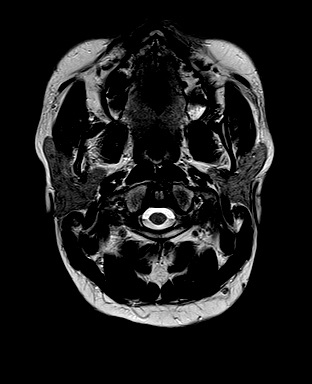

[Series 9: swi_images · axial · 3.0mm · 0.75mm/px · z∈[-90,+73]mm · 3 of 56 slices shown]
[im 1/56]
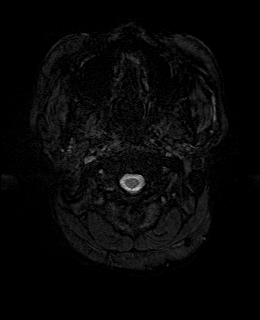
[im 28/56]
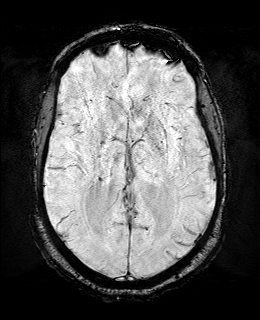
[im 56/56]
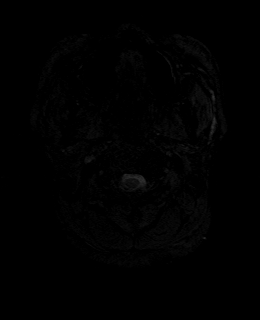

[Series 11: FLAIR · axial · 3.0mm · 0.75mm/px · z∈[-89,+73]mm · 3 of 56 slices shown]
[im 1/56]
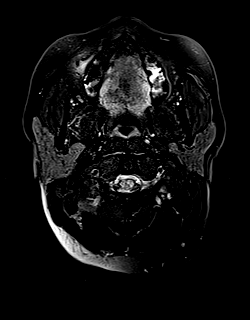
[im 28/56]
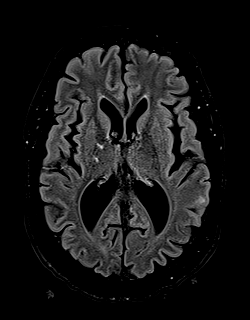
[im 56/56]
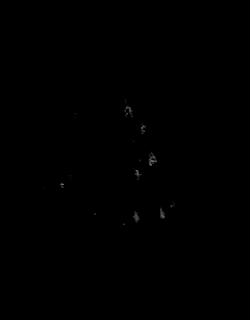

[Series 12: T1 · axial · 1.0mm · 0.94mm/px · z∈[-86,+70]mm · 9 of 160 slices shown (2 of 2)]
[im 1/160]
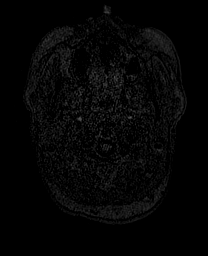
[im 20/160]
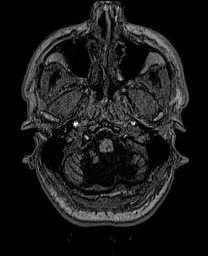
[im 40/160]
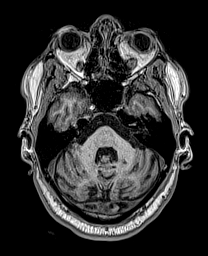
[im 60/160]
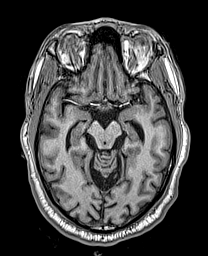
[im 80/160]
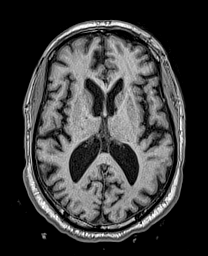
[im 100/160]
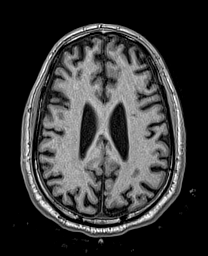
[im 120/160]
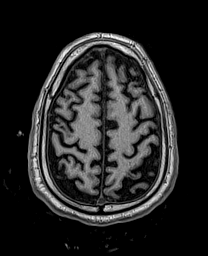
[im 140/160]
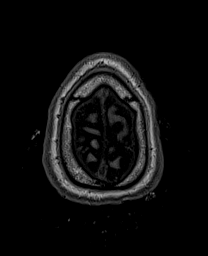
[im 160/160]
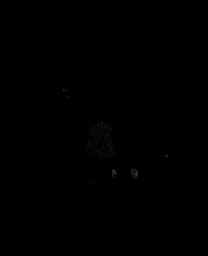

[Series 13: cor dwi_tracew · coronal · 5.0mm · 1.53mm/px · 4 of 64 slices shown]
[im 1/64]
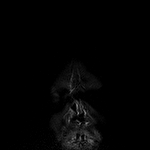
[im 22/64]
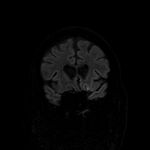
[im 43/64]
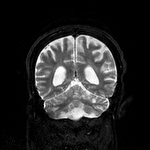
[im 64/64]
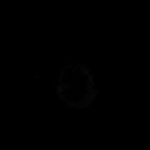

[Series 14: cor dwi_adc · coronal · 5.0mm · 1.53mm/px · 2 of 32 slices shown]
[im 1/32]
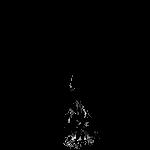
[im 32/32]
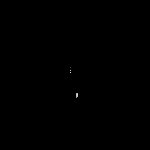

[Series 15: T2 post-contrast · coronal · 5.0mm · 0.57mm/px · 2 of 32 slices shown]
[im 1/32]
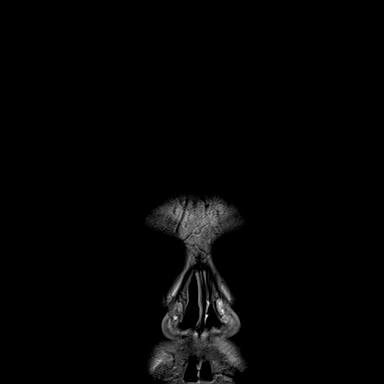
[im 32/32]
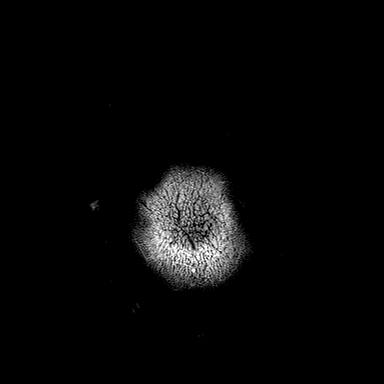

[Series 16: T1 post-contrast · axial · 1.0mm · 0.94mm/px · z∈[-86,+70]mm · 9 of 160 slices shown (1 of 3)]
[im 1/160]
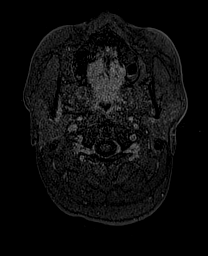
[im 20/160]
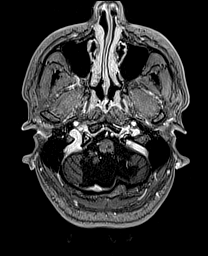
[im 40/160]
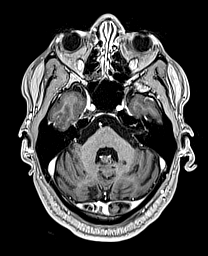
[im 60/160]
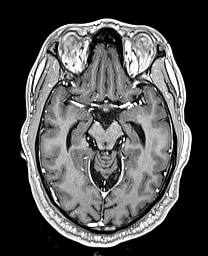
[im 80/160]
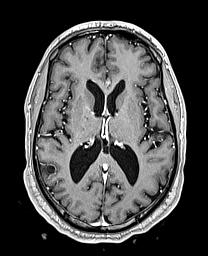
[im 100/160]
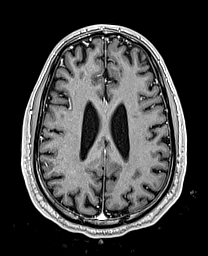
[im 120/160]
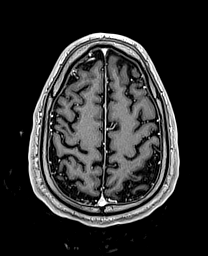
[im 140/160]
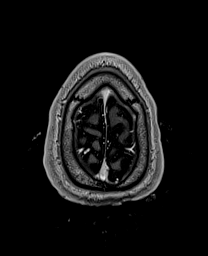
[im 160/160]
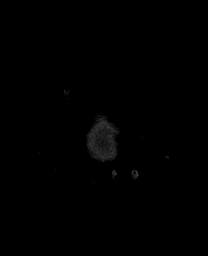

[Series 17: T1 post-contrast · coronal · 5.0mm · 0.43mm/px · 2 of 32 slices shown (2 of 3)]
[im 1/32]
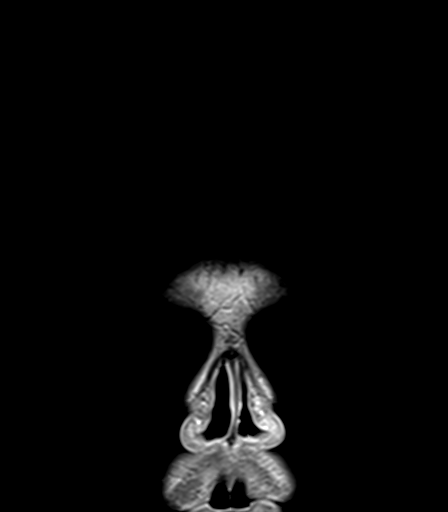
[im 32/32]
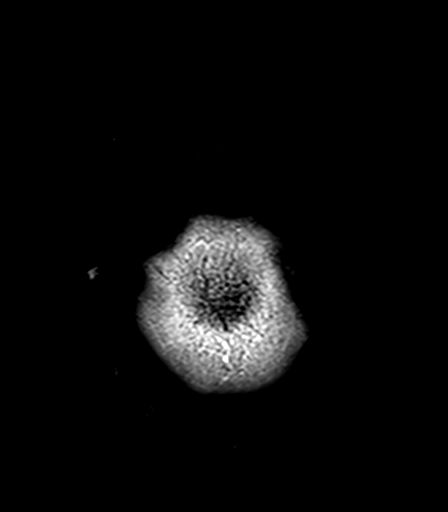

[Series 18: T1 post-contrast · sagittal · 5.0mm · 0.75mm/px · 1 of 26 slices shown (3 of 3)]
[im 1/26]
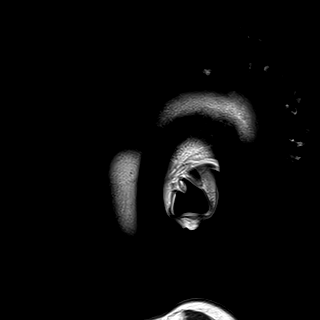

[48 of 48 positions shown; findings below may reference images not displayed]

FINDINGS: Brain: No acute infarct, mass effect or extra-axial collection. No
acute or chronic hemorrhage. There is advanced volume loss for age.
Multifocal cerebellar encephalomalacia and inferior cerebellar
atrophy is unchanged. There is no abnormal contrast enhancement.

Vascular: Major flow voids are preserved.

Skull and upper cervical spine: Normal calvarium and skull base.
Visualized upper cervical spine and soft tissues are normal.

Sinuses/Orbits:No paranasal sinus fluid levels or advanced mucosal
thickening. No mastoid or middle ear effusion. Normal orbits.
IMPRESSION: 1. No acute intracranial abnormality.
2. Unchanged cerebellar encephalomalacia and inferior cerebellar
atrophy, likely sequela of the remote infection.

## 2021-11-13 MED ORDER — GADOBUTROL 1 MMOL/ML IV SOLN
9.0000 mL | Freq: Once | INTRAVENOUS | Status: AC | PRN
  Administered 2021-11-13: 9 mL via INTRAVENOUS

## 2022-01-04 ENCOUNTER — Ambulatory Visit: Payer: Medicaid Other | Admitting: Infectious Disease

## 2022-01-17 ENCOUNTER — Encounter: Payer: Self-pay | Admitting: Infectious Disease

## 2022-01-17 ENCOUNTER — Other Ambulatory Visit: Payer: Self-pay

## 2022-01-17 ENCOUNTER — Ambulatory Visit (INDEPENDENT_AMBULATORY_CARE_PROVIDER_SITE_OTHER): Payer: Medicare Other | Admitting: Infectious Disease

## 2022-01-17 VITALS — BP 107/72 | HR 77 | Temp 97.5°F | Wt 211.0 lb

## 2022-01-17 DIAGNOSIS — G4700 Insomnia, unspecified: Secondary | ICD-10-CM | POA: Diagnosis not present

## 2022-01-17 DIAGNOSIS — B20 Human immunodeficiency virus [HIV] disease: Secondary | ICD-10-CM

## 2022-01-17 DIAGNOSIS — R112 Nausea with vomiting, unspecified: Secondary | ICD-10-CM | POA: Diagnosis not present

## 2022-01-17 DIAGNOSIS — G4489 Other headache syndrome: Secondary | ICD-10-CM | POA: Diagnosis not present

## 2022-01-17 DIAGNOSIS — G40909 Epilepsy, unspecified, not intractable, without status epilepticus: Secondary | ICD-10-CM

## 2022-01-17 DIAGNOSIS — B582 Toxoplasma meningoencephalitis: Secondary | ICD-10-CM | POA: Diagnosis not present

## 2022-01-17 DIAGNOSIS — A523 Neurosyphilis, unspecified: Secondary | ICD-10-CM

## 2022-01-17 DIAGNOSIS — F0280 Dementia in other diseases classified elsewhere without behavioral disturbance: Secondary | ICD-10-CM

## 2022-01-17 HISTORY — DX: Epilepsy, unspecified, not intractable, without status epilepticus: G40.909

## 2022-01-17 MED ORDER — ATORVASTATIN CALCIUM 20 MG PO TABS: 20.0000 mg | ORAL_TABLET | Freq: Every day | ORAL | 11 refills | Status: DC

## 2022-01-17 MED ORDER — SUMATRIPTAN SUCCINATE 50 MG PO TABS: 50.0000 mg | ORAL_TABLET | ORAL | 5 refills | Status: DC | PRN

## 2022-01-17 MED ORDER — RUKOBIA 600 MG PO TB12: 600.0000 mg | ORAL_TABLET | Freq: Two times a day (BID) | ORAL | 11 refills | Status: DC

## 2022-01-17 MED ORDER — SULFAMETHOXAZOLE-TRIMETHOPRIM 800-160 MG PO TABS: 1.0000 | ORAL_TABLET | Freq: Two times a day (BID) | ORAL | 11 refills | Status: DC

## 2022-01-17 MED ORDER — ONDANSETRON HCL 4 MG PO TABS: 4.0000 mg | ORAL_TABLET | Freq: Four times a day (QID) | ORAL | 4 refills | Status: DC | PRN

## 2022-01-17 MED ORDER — BIKTARVY 50-200-25 MG PO TABS: 1.0000 | ORAL_TABLET | Freq: Every day | ORAL | 11 refills | Status: DC

## 2022-01-17 MED ORDER — HYDROXYZINE HCL 25 MG PO TABS: 25.0000 mg | ORAL_TABLET | Freq: Every evening | ORAL | 11 refills | Status: AC | PRN

## 2022-01-17 NOTE — Progress Notes (Signed)
? ?Chief complaint: Follow-up for HIV disease ? ? ?Subjective:  ? ? Patient ID: Arthur [name], male    DOB: 01/15/1981, 41 y.o.   MRN: [id number] ? ?HPI  ? ?Arthur [name] is a 41 year-old African-American man living with HIV who  fell out of care and was not seen by me since 2015.  He previously failed Atripla with resistance and IK 103 when he is being seen by Dr. Philipp DeputyVollmer.  I changed him to a protease-based regimen with Prezista Norvir and Truvada but he had not been seen since 2015.  He was admitted to the hospital with profound encephalopathy. ? ?He was found to have neurosyphilis and was treated with penicillin.  He was also broadly treated for other possible CNS pathology including CMV encephalitis with ganciclovir RI PE for possible tuberculosis with steroids.  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 ? ?He was ultimately discharged from the inpatient hospital service to inpatient rehab and completed therapy while there. ? ?Was previously suffering from dysphagia and nutritional deficiency he was given a PEG tube through which she was getting his medications along with nutrition. ? ?He is living at home with his mother and also his sister who is a CMA and have been overseeing his medications. ? ?He has had dramatic improvement in his neurological status and maintained perfect virological control. We retreated him for Neurosyphilis when his titers went up. He had low hgb while on PCN but never verified this prior to completion. ? ?His CD4 is slow to come up. ? ?I added Rukobia in July 2021 in hopes to help increase his CD4 count but he did not tolerate this medication with nausea and vomiting. ? ?He has remained suppressed. ? ?I saw Arthur [name] in March 2022 ? ?He had  been having headaches in the left side of his head sometimes wake him up at night.  He rated them as being about a 7 out of 10 in severity and they are also of a stabbing type  of nature.  They ddid  respond to Tylenol and typically resolve within 40 minutes.  . ? ?The hydroxyzine seen does help him sleep ? ?Referred him to neurology who have been working him up for headaches and also has cognitive impairments.  He is also seeing neuropsychology. ? ?His viral load remained suppressed on labs we checked in May 2022 but CD4 still is in the 7850s and was not coming up very much. ? ? ?In the interim we had to change out his sulfadiazine for clindamycin. ? ?Several visits ago I thought it would be good idea to simplify his toxoplasma secondary prophylaxis by placing him on 1 double strength Bactrim twice daily and getting rid of the pyrimethamine clindamycin and leucovorin. ? ?WI also decided to re challenge him  with [place]ukobia with antiemetic therapy may help him. ? ?He has been able to keep the [place]obia down with premedication. ? ?Ryin's viral load remains suppressed and CD4 count came up a bit. ? ?He then began having more problems with balance and unable to walk without using his cane where he was able to do this before.  He also sometimes wokeup in the morning with speech that is more difficult to understand according to mom accompanies him to visit today. ? ?Obtained an MRI of the brain which did not show any new findings whatsoever. ? ?In going over his medications with him and his family today appears that  he does not have divalproex acid. ? ?This was sent by his neurologist Dr. Epimenio Foot and indeed sent to the [facility]. ? ?Irving Burton claim he has not been on this medication since February but he has not had any seizures I will check base with Dr. Epimenio Foot if he would like Arthur [name] to be on this. ? ? ? ? ? ? ? ?Past Medical History:  ?Diagnosis Date  ? Anemia   ? Anxiety   ? Ataxia 11/08/2021  ? Depression   ? GERD (gastroesophageal reflux disease)   ? Headache   ? Headache syndrome 02/04/2021  ? HIV infection (HCC)   ? HIV infection with neurological disease (HCC)  08/13/2021  ? Insomnia 01/26/2021  ? Substance abuse (HCC)   ? Vaccine counseling 08/13/2021  ? ? ?Past Surgical History:  ?Procedure Laterality Date  ? IR GASTROSTOMY TUBE MOD SED  09/02/2019  ? IR GASTROSTOMY TUBE REMOVAL  10/28/2019  ? TOOTH EXTRACTION    ? ? ?Family History  ?Problem Relation Age of Onset  ? Diabetes Neg Hx   ? CAD Neg Hx   ? Hypertension Neg Hx   ? ? ?  ?Social History  ? ?Socioeconomic History  ? Marital status: Single  ?  Spouse name: Not on file  ? Number of children: Not on file  ? Years of education: Not on file  ? Highest education level: Not on file  ?Occupational History  ? Not on file  ?Tobacco Use  ? Smoking status: Every Day  ?  Packs/day: 0.50  ?  Years: 17.00  ?  Pack years: 8.50  ?  Types: Cigarettes  ? Smokeless tobacco: Never  ?Vaping Use  ? Vaping Use: Never used  ?Substance and Sexual Activity  ? Alcohol use: No  ?  Comment: occassionally  ? Drug use: Yes  ?  Frequency: 7.0 times per week  ?  Types: Marijuana  ?  Comment: Daily  ? Sexual activity: Not Currently  ?  Partners: Male  ?  Comment: declined condoms 09/15/20  ?Other Topics Concern  ? Not on file  ?Social History Narrative  ? Right Handed  ? Drinks 2-3 cups caffeine daily  ? ?Social Determinants of Health  ? ?Financial Resource Strain: Not on file  ?Food Insecurity: Not on file  ?Transportation Needs: Not on file  ?Physical Activity: Not on file  ?Stress: Not on file  ?Social Connections: Not on file  ? ? ?No Known Allergies ? ? ?Current Outpatient Medications:  ?  acetaminophen (TYLENOL) 500 MG tablet, Take 500 mg by mouth every 6 (six) hours as needed., Disp: , Rfl:  ?  atorvastatin (LIPITOR) 20 MG tablet, Take 1 tablet (20 mg total) by mouth daily. (Patient not taking: Reported on 11/08/2021), Disp: 90 tablet, Rfl: 3 ?  bictegravir-emtricitabine-tenofovir AF (BIKTARVY) 50-200-25 MG TABS tablet, Take 1 tablet by mouth daily., Disp: 30 tablet, Rfl: 11 ?  citalopram (CELEXA) 20 MG tablet, Take 1 tablet (20 mg total) by  mouth daily. (Patient not taking: Reported on 11/08/2021), Disp: 30 tablet, Rfl: 3 ?  divalproex (DEPAKOTE) 250 MG DR tablet, TAKE 2 TABLETS BY MOUTH TWICE DAILY, Disp: 120 tablet, Rfl: 5 ?  famotidine (PEPCID) 20 MG tablet, Take 1 tablet (20 mg total) by mouth at bedtime. (Patient not taking: Reported on 11/08/2021), Disp: 30 tablet, Rfl: 11 ?  fostemsavir tromethamine (RUKOBIA) 600 MG TB12 ER tablet, Take 1 tablet (600 mg total) by mouth 2 (two) times daily.,  Disp: 60 tablet, Rfl: 11 ?  hydrOXYzine (ATARAX/VISTARIL) 25 MG tablet, Take 1 tablet (25 mg total) by mouth at bedtime as needed (insomnia)., Disp: 30 tablet, Rfl: 11 ?  Menthol-Methyl Salicylate (MUSCLE RUB) 10-15 % CREA, Apply 1 application topically as needed for muscle pain. (Patient not taking: Reported on 11/08/2021), Disp:  , Rfl: 0 ?  ondansetron (ZOFRAN) 4 MG tablet, Take 1 tablet (4 mg total) by mouth every 6 (six) hours as needed for nausea., Disp: 120 tablet, Rfl: 4 ?  sulfamethoxazole-trimethoprim (BACTRIM DS) 800-160 MG tablet, Take 1 tablet by mouth 2 (two) times daily. TAKE 1 TABLET BY MOUTH 3 TIMES A WEEK, Disp: 60 tablet, Rfl: 11 ?  SUMAtriptan (IMITREX) 50 MG tablet, Take 1 tablet (50 mg total) by mouth every 2 (two) hours as needed for migraine. May repeat in 2 hours if headache persists or recurs. (Patient not taking: Reported on 11/08/2021), Disp: 10 tablet, Rfl: 0 ?  terbinafine (LAMISIL) 1 % cream, Apply topically daily. Apply to feet daily (Patient not taking: Reported on 11/08/2021), Disp: 30 g, Rfl: 0 ? ? ?Review of Systems  ?Unable to perform ROS: Dementia  ? ?   ?Objective:  ? Physical Exam ?Constitutional:   ?   Appearance: He is well-developed.  ?HENT:  ?   Head: Normocephalic and atraumatic.  ?Eyes:  ?   Conjunctiva/sclera: Conjunctivae normal.  ?Cardiovascular:  ?   Rate and Rhythm: Normal rate and regular rhythm.  ?Pulmonary:  ?   Effort: Pulmonary effort is normal. No respiratory distress.  ?   Breath sounds: No wheezing.   ?Abdominal:  ?   General: There is no distension.  ?   Palpations: Abdomen is soft.  ?Musculoskeletal:     ?   General: No tenderness. Normal range of motion.  ?   Cervical back: Normal range of motion and neck supple

## 2022-01-18 ENCOUNTER — Other Ambulatory Visit: Payer: Self-pay

## 2022-01-18 MED ORDER — SULFAMETHOXAZOLE-TRIMETHOPRIM 800-160 MG PO TABS: 1.0000 | ORAL_TABLET | Freq: Two times a day (BID) | ORAL | 11 refills | Status: DC

## 2022-01-19 LAB — HELPER T-LYMPH-CD4 (ARMC ONLY)
% CD 4 Pos. Lymph.: 10.4 % — ABNORMAL LOW (ref 30.8–58.5)
Absolute CD 4 Helper: 94 /uL — ABNORMAL LOW (ref 359–1519)
Basophils Absolute: 0 10*3/uL (ref 0.0–0.2)
Basos: 1 %
EOS (ABSOLUTE): 0.2 10*3/uL (ref 0.0–0.4)
Eos: 6 %
Hematocrit: 45.1 % (ref 37.5–51.0)
Hemoglobin: 15.2 g/dL (ref 13.0–17.7)
Immature Grans (Abs): 0 10*3/uL (ref 0.0–0.1)
Immature Granulocytes: 1 %
Lymphocytes Absolute: 0.9 10*3/uL (ref 0.7–3.1)
Lymphs: 24 %
MCH: 32 pg (ref 26.6–33.0)
MCHC: 33.7 g/dL (ref 31.5–35.7)
MCV: 95 fL (ref 79–97)
Monocytes Absolute: 0.4 10*3/uL (ref 0.1–0.9)
Monocytes: 9 %
Neutrophils Absolute: 2.3 10*3/uL (ref 1.4–7.0)
Neutrophils: 59 %
Platelets: 319 10*3/uL (ref 150–450)
RBC: 4.75 x10E6/uL (ref 4.14–5.80)
RDW: 14 % (ref 11.6–15.4)
WBC: 3.9 10*3/uL (ref 3.4–10.8)

## 2022-01-21 LAB — COMPLETE METABOLIC PANEL WITH GFR
AG Ratio: 1.1 (calc) (ref 1.0–2.5)
ALT: 9 U/L (ref 9–46)
AST: 15 U/L (ref 10–40)
Albumin: 4.4 g/dL (ref 3.6–5.1)
Alkaline phosphatase (APISO): 64 U/L (ref 36–130)
BUN: 12 mg/dL (ref 7–25)
CO2: 29 mmol/L (ref 20–32)
Calcium: 9.6 mg/dL (ref 8.6–10.3)
Chloride: 104 mmol/L (ref 98–110)
Creat: 1.26 mg/dL (ref 0.60–1.26)
Globulin: 3.9 g/dL (calc) — ABNORMAL HIGH (ref 1.9–3.7)
Glucose, Bld: 108 mg/dL — ABNORMAL HIGH (ref 65–99)
Potassium: 4.7 mmol/L (ref 3.5–5.3)
Sodium: 138 mmol/L (ref 135–146)
Total Bilirubin: 0.3 mg/dL (ref 0.2–1.2)
Total Protein: 8.3 g/dL — ABNORMAL HIGH (ref 6.1–8.1)
eGFR: 75 mL/min/{1.73_m2} (ref >=60)

## 2022-01-21 LAB — CBC WITH DIFFERENTIAL/PLATELET
Absolute Monocytes: 404 cells/uL (ref 200–950)
Basophils Absolute: 32 cells/uL (ref 0–200)
Basophils Relative: 0.8 %
Eosinophils Absolute: 220 cells/uL (ref 15–500)
Eosinophils Relative: 5.5 %
HCT: 44.1 % (ref 38.5–50.0)
Hemoglobin: 15.2 g/dL (ref 13.2–17.1)
Lymphs Abs: 956 cells/uL (ref 850–3900)
MCH: 32.1 pg (ref 27.0–33.0)
MCHC: 34.5 g/dL (ref 32.0–36.0)
MCV: 93 fL (ref 80.0–100.0)
MPV: 9.7 fL (ref 7.5–12.5)
Monocytes Relative: 10.1 %
Neutro Abs: 2388 cells/uL (ref 1500–7800)
Neutrophils Relative %: 59.7 %
Platelets: 317 10*3/uL (ref 140–400)
RBC: 4.74 10*6/uL (ref 4.20–5.80)
RDW: 13.9 % (ref 11.0–15.0)
Total Lymphocyte: 23.9 %
WBC: 4 10*3/uL (ref 3.8–10.8)

## 2022-01-21 LAB — RPR: RPR Ser Ql: REACTIVE — AB

## 2022-01-21 LAB — HIV-1 RNA QUANT-NO REFLEX-BLD
HIV 1 RNA Quant: NOT DETECTED copies/mL
HIV-1 RNA Quant, Log: NOT DETECTED Log copies/mL

## 2022-01-21 LAB — RPR TITER: RPR Titer: 1:16 {titer} — ABNORMAL HIGH

## 2022-01-21 LAB — FLUORESCENT TREPONEMAL AB(FTA)-IGG-BLD: Fluorescent Treponemal ABS: REACTIVE — AB

## 2022-01-21 LAB — T-HELPER CELLS (CD4) COUNT (NOT AT ARMC)

## 2022-02-15 ENCOUNTER — Ambulatory Visit (INDEPENDENT_AMBULATORY_CARE_PROVIDER_SITE_OTHER): Payer: Medicare Other | Admitting: Neurology

## 2022-02-15 ENCOUNTER — Encounter: Payer: Self-pay | Admitting: Neurology

## 2022-02-15 VITALS — BP 109/68 | HR 73 | Ht 71.0 in | Wt 213.0 lb

## 2022-02-15 DIAGNOSIS — B20 Human immunodeficiency virus [HIV] disease: Secondary | ICD-10-CM

## 2022-02-15 DIAGNOSIS — G969 Disorder of central nervous system, unspecified: Secondary | ICD-10-CM

## 2022-02-15 DIAGNOSIS — G4489 Other headache syndrome: Secondary | ICD-10-CM

## 2022-02-15 DIAGNOSIS — F0280 Dementia in other diseases classified elsewhere without behavioral disturbance: Secondary | ICD-10-CM

## 2022-02-15 MED ORDER — DIVALPROEX SODIUM 250 MG PO DR TAB: DELAYED_RELEASE_TABLET | ORAL | 1 refills | Status: DC

## 2022-02-15 NOTE — Patient Instructions (Signed)
Meds ordered this encounter  Medications   divalproex (DEPAKOTE) 250 MG DR tablet    Sig: TAKE 2 TABLETS BY MOUTH TWICE DAILY    Dispense:  360 tablet    Refill:  1   Continue depakote for migraine prevention, can continue Imitrex as needed for acute headache treatment Return back in 6 months

## 2022-02-15 NOTE — Progress Notes (Signed)
Patient: Arthur [name] Appenzeller Date of Birth: 03/21/1981  Reason for Visit: Follow up History from: Patient, mother Primary Neurologist: Dr. Delena Balihima  ASSESSMENT AND PLAN 41 y.o. year old male   1.  HIV infection 2.  Cerebral toxoplasmosis infection 3.  Neurosyphilis 4.  Headache 5.  Static encephalopathy  -Continue Depakote for migraine prevention -Continue Imitrex as needed for acute headache -Formal neuropsychological testing was inconclusive, but suspected neurocognitive disorder given clinical history -He potentially exhibits pseudobulbar affect -Follow-up at our office in 6 months, will be followed by Dr. Delena Balihima since Dr. Anne HahnWillis has retired. I will arrange for next appointment with Dr. Delena Balihima to establish care for close consult with ID if needed   HISTORY OF PRESENT ILLNESS: Today 02/15/22 Arthur [name] Nemes is here today for follow-up.  Has reported cognitive impairment, headache, gait instability since hospitalization in November 2020 when he was found to have neurosyphilis and cerebral toxoplasmosis infection.  When he last saw Dr. Anne HahnWillis in May 2022 he was sent for formal neuropsychological testing, started on Depakote with headache.MRI of the brain with and without contrast in February 2023 showed unchanged cerebellar encephalomalacia and inferior cerebellar atrophy.  Neuropsychological testing with Dr. 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  Here with his mom, had restarted Depakote few weeks ago, had stopped for several months because headaches became so rare. Taking 500 mg twice daily, in the last 2 weeks, has had 2 migraines. Imitrex helps within 30 minutes.   Still some balance instability, uses a cane, but stable. Thinks speech is improving. Speech is still thick, better with vocabulary. No  trouble with behavior. Lives with his mom.  HISTORY  02/04/2021 Dr. Anne HahnWillis: [name] is a 41 year old right-handed black male with a history of HIV infection.  The patient was admitted to the hospital around 28 July 2019 with severe lethargy and fever.  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  The patient was aggressively treated for these issues.  The patient comes in the office today with his sister who claims that within 2 weeks prior to that hospitalization, he was cognitively normal, ever since the hospitalization he has had significant cognitive impairment and gait instability.  The patient has gradually improved with his ability to walk and get around, he uses a cane.  His cognitive abilities have not improved.  His mentation has not worsened however.  The patient has difficulty with short-term memory, he denies any word finding problems, he will have trouble remembering names for people at times.  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  The patient requires assistance keeping up with medications and appointments, his mother does the finances.  The patient was working in [employer] business before the hospitalization in 2020, but he has not been able to work since that time.  The patient denies issues controlling the bowels or the bladder.  He has some weakness sensations in the legs.  He reports no numbness of the extremities.  He does report frequent headaches, he is having least 4 headaches a week lasting at least a half an hour.  He takes Tylenol for the headache which seems to help, he was given a prescription for Imitrex but this was not as helpful.  The patient does report photophobia and phonophobia, he denies any  nausea or vomiting.  His sister has a history of migraine.  He was not having headaches prior to the November 2020 hospitalization.  He recently had MRI of the brain that shows  ongoing improvement of the toxoplasmosis infection.  His viral counts for HIV are low.  He is sent to this office for further evaluation.  REVIEW OF SYSTEMS: Out of a complete 14 system review of symptoms, the patient complains only of the following symptoms, and all other reviewed systems are negative.  See HPI  ALLERGIES: No Known Allergies  HOME MEDICATIONS: Outpatient Medications Prior to Visit  Medication Sig Dispense Refill   acetaminophen (TYLENOL) 500 MG tablet Take 500 mg by mouth every 6 (six) hours as needed.     atorvastatin (LIPITOR) 20 MG tablet Take 1 tablet (20 mg total) by mouth daily. 30 tablet 11   bictegravir-emtricitabine-tenofovir AF (BIKTARVY) 50-200-25 MG TABS tablet Take 1 tablet by mouth daily. 30 tablet 11   famotidine (PEPCID) 20 MG tablet Take 1 tablet (20 mg total) by mouth at bedtime. 30 tablet 11   fostemsavir tromethamine (RUKOBIA) 600 MG TB12 ER tablet Take 1 tablet (600 mg total) by mouth 2 (two) times daily. 60 tablet 11   hydrOXYzine (ATARAX) 25 MG tablet Take 1 tablet (25 mg total) by mouth at bedtime as needed (insomnia). 30 tablet 11   Menthol-Methyl Salicylate (MUSCLE RUB) 10-15 % CREA Apply 1 application topically as needed for muscle pain.  0   ondansetron (ZOFRAN) 4 MG tablet Take 1 tablet (4 mg total) by mouth every 6 (six) hours as needed for nausea. 120 tablet 4   sulfamethoxazole-trimethoprim (BACTRIM DS) 800-160 MG tablet Take 1 tablet by mouth 2 (two) times daily. 60 tablet 11   SUMAtriptan (IMITREX) 50 MG tablet Take 1 tablet (50 mg total) by mouth every 2 (two) hours as needed for migraine. May repeat in 2 hours if headache persists or recurs. 10 tablet 5   divalproex (DEPAKOTE) 250 MG DR tablet TAKE 2 TABLETS BY MOUTH TWICE DAILY 120 tablet 5   No facility-administered medications prior to visit.    PAST MEDICAL HISTORY: Past Medical History:  Diagnosis Date   Anemia    Anxiety    Ataxia 11/08/2021   Depression    GERD  (gastroesophageal reflux disease)    Headache    Headache syndrome 02/04/2021   HIV infection (HCC)    HIV infection with neurological disease (HCC) 08/13/2021   Insomnia 01/26/2021   Seizure disorder (HCC) 01/17/2022   Substance abuse (HCC)    Vaccine counseling 08/13/2021    PAST SURGICAL HISTORY: Past Surgical History:  Procedure Laterality Date   IR GASTROSTOMY TUBE MOD SED  09/02/2019   IR GASTROSTOMY TUBE REMOVAL  10/28/2019   TOOTH EXTRACTION      FAMILY HISTORY: Family History  Problem Relation Age of Onset   Diabetes Neg Hx    CAD Neg Hx    Hypertension Neg Hx     SOCIAL HISTORY: Social History   Socioeconomic History   Marital status: Single    Spouse name: Not on file   Number of children: Not on file   Years of education: Not on file   Highest education level: Not on file  Occupational History   Not on file  Tobacco Use   Smoking status: Every Day    Packs/day: 0.50    Years: 17.00    Pack years: 8.50    Types: Cigarettes   Smokeless tobacco: Never  Vaping Use   Vaping Use: Never used  Substance and Sexual Activity   Alcohol use: No    Comment: occassionally   Drug use: Yes    Frequency: 7.0 times per week    Types: Marijuana    Comment: Daily   Sexual activity: Not Currently    Partners: Male    Comment: declined condoms 09/15/20  Other Topics Concern   Not on file  Social History Narrative   Right Handed   Drinks 2-3 cups caffeine daily   Social Determinants of Health   Financial Resource Strain: Not on file  Food Insecurity: Not on file  Transportation Needs: Not on file  Physical Activity: Not on file  Stress: Not on file  Social Connections: Not on file  Intimate Partner Violence: Not on file    PHYSICAL EXAM  Vitals:   02/15/22 1051  BP: 109/68  Pulse: 73  Weight: 213 lb (96.6 kg)  Height: 5\' 11"  (1.803 m)   Body mass index is 29.71 kg/m.  Generalized: Well developed, in no acute distress  Neurological examination   Mentation: Alert oriented to time, place, history is mostly provided by his mother.  Follows all commands, speech sounds thick. Laughs inappropriately at times.  Cranial nerve II-XII: Pupils were equal round reactive to light. Exotropia of right eye, left eye is normal. Facial sensation and strength were normal. Head turning and shoulder shrug  were normal and symmetric.  Mild asymmetry of th left mouth. Motor: The motor testing reveals 5 over 5 strength of all 4 extremities. Good symmetric motor tone is noted throughout.  Sensory: Sensory testing is intact to soft touch on all 4 extremities. No evidence of extinction is noted.  Coordination: Mild dysmetria with finger-nose-finger, heel to shin is normal but is slow Gait and station: Gait is wide-based, can walk independently normally uses a cane.  Tandem gait is unsteady Reflexes: Deep tendon reflexes are symmetric and normal bilaterally.   DIAGNOSTIC DATA (LABS, IMAGING, TESTING) - I reviewed patient records, labs, notes, testing and imaging myself where available.  Lab Results  Component Value Date   WBC 4.0 01/17/2022   HGB 15.2 01/17/2022   HCT 44.1 01/17/2022   MCV 93.0 01/17/2022   PLT 317 01/17/2022      Component Value Date/Time   NA 138 01/17/2022 1629   NA 138 11/19/2019 0955   K 4.7 01/17/2022 1629   CL 104 01/17/2022 1629   CO2 29 01/17/2022 1629   GLUCOSE 108 (H) 01/17/2022 1629   BUN 12 01/17/2022 1629   BUN 6 11/19/2019 0955   CREATININE 1.26 01/17/2022 1629   CALCIUM 9.6 01/17/2022 1629   PROT 8.3 (H) 01/17/2022 1629   PROT 8.5 11/19/2019 0955   ALBUMIN 3.8 (L) 11/19/2019 0955   AST 15 01/17/2022 1629   ALT 9 01/17/2022 1629   ALKPHOS 75 11/19/2019 0955   BILITOT 0.3 01/17/2022 1629   BILITOT 0.2 11/19/2019 0955   GFRNONAA 73 01/26/2021 0000   GFRAA 85 01/26/2021 0000   Lab Results  Component Value Date   CHOL 250 (H) 11/08/2021   HDL 39 (L) 11/08/2021   LDLCALC 183 (H) 11/08/2021   TRIG 138  11/08/2021   CHOLHDL 6.4 (H) 11/08/2021   No results found for: HGBA1C No results found for: 11/10/2021 Lab Results  Component Value Date   TSH 0.792 08/08/2019    13/08/2019, AGNP-C, DNP 02/15/2022, 1:06 PM [facility] [address] [place], [place] [phone])  [phone]   

## 2022-05-18 ENCOUNTER — Ambulatory Visit: Payer: Medicare Other | Admitting: Infectious Disease

## 2022-05-18 ENCOUNTER — Telehealth: Payer: Self-pay

## 2022-05-18 NOTE — Telephone Encounter (Signed)
Called patient to reschedule missed appointment. No answer. Left HIPAA-compliant voicemail requesting call back.  ? ?Arthur Pressley E Latrica Clowers, RN  ?

## 2022-06-08 ENCOUNTER — Encounter: Payer: Self-pay | Admitting: Infectious Disease

## 2022-06-08 ENCOUNTER — Ambulatory Visit (INDEPENDENT_AMBULATORY_CARE_PROVIDER_SITE_OTHER): Payer: Medicare Other | Admitting: Infectious Disease

## 2022-06-08 ENCOUNTER — Other Ambulatory Visit: Payer: Self-pay

## 2022-06-08 VITALS — BP 110/58 | HR 83 | Resp 16 | Ht 71.0 in | Wt 219.0 lb

## 2022-06-08 DIAGNOSIS — B20 Human immunodeficiency virus [HIV] disease: Secondary | ICD-10-CM | POA: Diagnosis not present

## 2022-06-08 DIAGNOSIS — G40909 Epilepsy, unspecified, not intractable, without status epilepticus: Secondary | ICD-10-CM

## 2022-06-08 DIAGNOSIS — B582 Toxoplasma meningoencephalitis: Secondary | ICD-10-CM

## 2022-06-08 MED ORDER — SUMATRIPTAN SUCCINATE 50 MG PO TABS: 50.0000 mg | ORAL_TABLET | ORAL | 5 refills | Status: DC | PRN

## 2022-06-08 MED ORDER — RUKOBIA 600 MG PO TB12: 600.0000 mg | ORAL_TABLET | Freq: Two times a day (BID) | ORAL | 11 refills | Status: DC

## 2022-06-08 MED ORDER — SULFAMETHOXAZOLE-TRIMETHOPRIM 800-160 MG PO TABS: 1.0000 | ORAL_TABLET | Freq: Two times a day (BID) | ORAL | 11 refills | Status: DC

## 2022-06-08 MED ORDER — BIKTARVY 50-200-25 MG PO TABS: 1.0000 | ORAL_TABLET | Freq: Every day | ORAL | 11 refills | Status: DC

## 2022-06-08 MED ORDER — ATORVASTATIN CALCIUM 20 MG PO TABS: 20.0000 mg | ORAL_TABLET | Freq: Every day | ORAL | 11 refills | Status: DC

## 2022-06-08 NOTE — Progress Notes (Signed)
Chief complaint: Follow-up for AIDS CNS toxoplasmosis seizure disorder Subjective:    Patient ID: Arthur [name], male    DOB: 1981/04/12, 41 y.o.   MRN: [id number]  HPI   [name] is a 41 year-old African-American man living with HIV who  fell out of care and was not seen by me since 2015.  He previously failed Atripla with resistance and IK 103 when he is being seen by Dr. Philipp Deputy.  I changed him to a protease-based regimen with Prezista Norvir and Truvada but he had not been seen since 2015.  He was admitted to the hospital with profound encephalopathy.  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  He was also broadly treated for other possible CNS pathology including CMV encephalitis with ganciclovir RI PE for possible tuberculosis with steroids.  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  He was ultimately discharged from the inpatient hospital service to inpatient rehab and completed therapy while there.  Was previously suffering from dysphagia and nutritional deficiency he was given a PEG tube through which she was getting his medications along with nutrition.  He is living at home with his mother and also his sister who is a CMA and have been overseeing his medications.  He has had dramatic improvement in his neurological status and maintained perfect virological control. We retreated him for Neurosyphilis when his titers went up. He had low hgb while on PCN but never verified this prior to completion.  His CD4 is slow to come up.  I added Rukobia in July 2021 in hopes to help increase his CD4 count but he did not tolerate this medication with nausea and vomiting.  He has remained suppressed.  I saw Irl in March 2022  He had  been having headaches in the left side of his head sometimes wake him up at night.  He rated them as being about a 7 out of 10 in severity and they are  also of a stabbing type of nature.  They ddid  respond to Tylenol and typically resolve within 40 minutes.  .  The hydroxyzine seen does help him sleep  Referred him to neurology who have been working him up for headaches and also has cognitive impairments.  He is also seeing neuropsychology.  His viral load remained suppressed on labs we checked in May 2022 but CD4 still is in the 62s and was not coming up very much.   In the interim we had to change out his sulfadiazine for clindamycin.  Several visits ago I thought it would be good idea to simplify his toxoplasma secondary prophylaxis by placing him on 1 double strength Bactrim twice daily and getting rid of the pyrimethamine clindamycin and leucovorin.  WI also decided to re challenge him  with [name] with antiemetic therapy may help him.  He has been able to keep the [name] down with premedication.  Jon's viral load remains suppressed and CD4 count came up a bit.  He then began having more problems with balance and unable to walk without using his cane where he was able to do this before.  He also sometimes wokeup in the morning with speech that is more difficult to understand according to mom accompanies him to visit today.  Obtained an MRI of the brain which did not show any new findings whatsoever.  He is plugged back in with neurology and is on Depakote for  seizures   Past Medical History:  Diagnosis Date   Anemia    Anxiety    Ataxia 11/08/2021   Depression    GERD (gastroesophageal reflux disease)    Headache    Headache syndrome 02/04/2021   HIV infection (HCC)    HIV infection with neurological disease (HCC) 08/13/2021   Insomnia 01/26/2021   Seizure disorder (HCC) 01/17/2022   Substance abuse (HCC)    Vaccine counseling 08/13/2021    Past Surgical History:  Procedure Laterality Date   IR GASTROSTOMY TUBE MOD SED  09/02/2019   IR GASTROSTOMY TUBE REMOVAL  10/28/2019   TOOTH EXTRACTION      Family History   Problem Relation Age of Onset   Diabetes Neg Hx    CAD Neg Hx    Hypertension Neg Hx       Social History   Socioeconomic History   Marital status: Single    Spouse name: Not on file   Number of children: Not on file   Years of education: Not on file   Highest education level: Not on file  Occupational History   Not on file  Tobacco Use   Smoking status: Every Day    Packs/day: 0.50    Years: 17.00    Total pack years: 8.50    Types: Cigarettes   Smokeless tobacco: Never  Vaping Use   Vaping Use: Never used  Substance and Sexual Activity   Alcohol use: No    Comment: occassionally   Drug use: Yes    Frequency: 7.0 times per week    Types: Marijuana    Comment: Daily   Sexual activity: Not Currently    Partners: Male    Comment: declined condoms 09/15/20  Other Topics Concern   Not on file  Social History Narrative   Right Handed   Drinks 2-3 cups caffeine daily   Social Determinants of Health   Financial Resource Strain: Not on file  Food Insecurity: Not on file  Transportation Needs: Not on file  Physical Activity: Not on file  Stress: Not on file  Social Connections: Not on file    No Known Allergies   Current Outpatient Medications:    acetaminophen (TYLENOL) 500 MG tablet, Take 500 mg by mouth every 6 (six) hours as needed., Disp: , Rfl:    atorvastatin (LIPITOR) 20 MG tablet, Take 1 tablet (20 mg total) by mouth daily., Disp: 30 tablet, Rfl: 11   bictegravir-emtricitabine-tenofovir AF (BIKTARVY) 50-200-25 MG TABS tablet, Take 1 tablet by mouth daily., Disp: 30 tablet, Rfl: 11   divalproex (DEPAKOTE) 250 MG DR tablet, TAKE 2 TABLETS BY MOUTH TWICE DAILY, Disp: 360 tablet, Rfl: 1   famotidine (PEPCID) 20 MG tablet, Take 1 tablet (20 mg total) by mouth at bedtime., Disp: 30 tablet, Rfl: 11   fostemsavir tromethamine (RUKOBIA) 600 MG TB12 ER tablet, Take 1 tablet (600 mg total) by mouth 2 (two) times daily., Disp: 60 tablet, Rfl: 11   hydrOXYzine  (ATARAX) 25 MG tablet, Take 1 tablet (25 mg total) by mouth at bedtime as needed (insomnia)., Disp: 30 tablet, Rfl: 11   Menthol-Methyl Salicylate (MUSCLE RUB) 10-15 % CREA, Apply 1 application topically as needed for muscle pain., Disp:  , Rfl: 0   ondansetron (ZOFRAN) 4 MG tablet, Take 1 tablet (4 mg total) by mouth every 6 (six) hours as needed for nausea., Disp: 120 tablet, Rfl: 4   sulfamethoxazole-trimethoprim (BACTRIM DS) 800-160 MG tablet, Take 1 tablet by mouth 2 (two)  times daily., Disp: 60 tablet, Rfl: 11   SUMAtriptan (IMITREX) 50 MG tablet, Take 1 tablet (50 mg total) by mouth every 2 (two) hours as needed for migraine. May repeat in 2 hours if headache persists or recurs., Disp: 10 tablet, Rfl: 5   Review of Systems  Unable to perform ROS: Dementia       Objective:   Physical Exam Constitutional:      Appearance: He is well-developed.  HENT:     Head: Normocephalic and atraumatic.  Eyes:     Conjunctiva/sclera: Conjunctivae normal.  Cardiovascular:     Rate and Rhythm: Normal rate and regular rhythm.  Pulmonary:     Effort: Pulmonary effort is normal. No respiratory distress.     Breath sounds: No wheezing.  Abdominal:     General: There is no distension.     Palpations: Abdomen is soft.  Musculoskeletal:        General: No tenderness. Normal range of motion.     Cervical back: Normal range of motion and neck supple.  Skin:    General: Skin is warm and dry.     Coloration: Skin is not pale.     Findings: No erythema or rash.  Neurological:     Mental Status: He is alert.  Psychiatric:        Attention and Perception: Attention normal.        Mood and Affect: Mood normal.        Speech: Speech is delayed.        Behavior: Behavior normal. Behavior is cooperative.        Cognition and Memory: He exhibits impaired recent memory and impaired remote memory.          Assessment & Plan:    HIV/AIDS:  I will add order HIV viral load CD4 count CBC with  differential CMP, RPR GC and chlamydia and I will continue  KARELL TUKES, Rukobia, and Bactrim prescriptions  Lab Results  Component Value Date   HIV1RNAQUANT NOT DETECTED 01/17/2022   Lab Results  Component Value Date   CD4TABS 86 (L) 05/04/2021   CD4TABS 57 (L) 01/26/2021   CD4TABS 54 (L) 09/15/2020    CNS toxoplasmosis:  For now continue Bactrim double strength tablet twice daily  Neurosyphilis: Titers is stabilized post treatment but still remain high recheck RPR with titer today  Hyperlipidemia:  We will check lipid panel and will continue atorvastatin  Seizure disorder being followed by neurology and currently on echo prescription which will be continued

## 2022-06-08 NOTE — Addendum Note (Signed)
Addended by: Harley Alto on: 06/08/2022 03:19 PM   Modules accepted: Orders

## 2022-06-09 LAB — T-HELPER CELLS (CD4) COUNT (NOT AT ARMC)
CD4 % Helper T Cell: 10 % — ABNORMAL LOW (ref 33–65)
CD4 T Cell Abs: 166 /uL — ABNORMAL LOW (ref 400–1790)

## 2022-06-10 ENCOUNTER — Telehealth: Payer: Self-pay

## 2022-06-10 ENCOUNTER — Other Ambulatory Visit (HOSPITAL_COMMUNITY): Payer: Self-pay | Admitting: Infectious Disease

## 2022-06-10 DIAGNOSIS — A539 Syphilis, unspecified: Secondary | ICD-10-CM

## 2022-06-10 NOTE — Telephone Encounter (Signed)
      INFECTIOUS DISEASE ATTENDING ADDENDUM:   Date: 06/10/2022  Patient name: Arthur [name]  Medical record number: [id number]  Date of birth: [birth year]   Patient with Neurosyphilis  Diagnosis: Neurosyphilis  Culture Result: RPR + 1:128  No Known Allergies  OPAT Orders Discharge antibiotics to be given via PICC line Penicillin 24 million units IV via continuous infusion daily x 14 days Duration: 14 days End Date:  October 3rd 2023 presuming start date of October 20th otherwise will need to be shifted to complete 14 days  [facility] Care Per Protocol:  Home health RN for IV administration and teaching; PICC line care and labs.    Labs weekly while on IV antibiotics: _x_ CBC with differential _x_ BMP   _x_ Please pull PIC at completion of IV antibiotics __ Please leave PIC in place until doctor has seen patient or been notified  Fax weekly labs to [phone]  Clinic Follow Up Appt:  He should have 2.4 MU of IM PCN one week after finishing IV PCN on October 10th   Acey Lav 06/10/2022, 12:17 PM

## 2022-06-10 NOTE — Telephone Encounter (Addendum)
Spoke to [employer] [employer] [facility] at [place]-   Provider:[place]  DX: Hx Neurosyphilis with new RPR 1:128 IV Med:PCN 24 Mill Units Constant X 14 Days  First Dose: Home (verified)  [facility]:06/14/22 Tue 11:30 arrival 12 appt.  End Date: 14 days after First dose   Will also get IM Bicillin 2.4 mill units 1 week after PICC pull.   Patient/family aware of results and plan.  Notified [facility] and [facility]

## 2022-06-11 LAB — COMPLETE METABOLIC PANEL WITH GFR
AG Ratio: 1.1 (calc) (ref 1.0–2.5)
ALT: 11 U/L (ref 9–46)
AST: 17 U/L (ref 10–40)
Albumin: 4.2 g/dL (ref 3.6–5.1)
Alkaline phosphatase (APISO): 56 U/L (ref 36–130)
BUN/Creatinine Ratio: 11 (calc) (ref 6–22)
BUN: 14 mg/dL (ref 7–25)
CO2: 25 mmol/L (ref 20–32)
Calcium: 9 mg/dL (ref 8.6–10.3)
Chloride: 104 mmol/L (ref 98–110)
Creat: 1.33 mg/dL — ABNORMAL HIGH (ref 0.60–1.26)
Globulin: 3.7 g/dL (calc) (ref 1.9–3.7)
Glucose, Bld: 81 mg/dL (ref 65–99)
Potassium: 4.5 mmol/L (ref 3.5–5.3)
Sodium: 137 mmol/L (ref 135–146)
Total Bilirubin: 0.3 mg/dL (ref 0.2–1.2)
Total Protein: 7.9 g/dL (ref 6.1–8.1)
eGFR: 70 mL/min/{1.73_m2} (ref >=60)

## 2022-06-11 LAB — LIPID PANEL
Cholesterol: 162 mg/dL (ref <200)
HDL: 41 mg/dL (ref >=40)
LDL Cholesterol (Calc): 88 mg/dL (calc)
Non-HDL Cholesterol (Calc): 121 mg/dL (calc) (ref <130)
Total CHOL/HDL Ratio: 4 (calc) (ref <5.0)
Triglycerides: 239 mg/dL — ABNORMAL HIGH (ref <150)

## 2022-06-11 LAB — CBC WITH DIFFERENTIAL/PLATELET
Absolute Monocytes: 559 cells/uL (ref 200–950)
Basophils Absolute: 19 cells/uL (ref 0–200)
Basophils Relative: 0.4 %
Eosinophils Absolute: 301 cells/uL (ref 15–500)
Eosinophils Relative: 6.4 %
HCT: 41.8 % (ref 38.5–50.0)
Hemoglobin: 14.6 g/dL (ref 13.2–17.1)
Lymphs Abs: 1908 cells/uL (ref 850–3900)
MCH: 34.1 pg — ABNORMAL HIGH (ref 27.0–33.0)
MCHC: 34.9 g/dL (ref 32.0–36.0)
MCV: 97.7 fL (ref 80.0–100.0)
MPV: 9.8 fL (ref 7.5–12.5)
Monocytes Relative: 11.9 %
Neutro Abs: 1913 cells/uL (ref 1500–7800)
Neutrophils Relative %: 40.7 %
Platelets: 305 10*3/uL (ref 140–400)
RBC: 4.28 10*6/uL (ref 4.20–5.80)
RDW: 13.4 % (ref 11.0–15.0)
Total Lymphocyte: 40.6 %
WBC: 4.7 10*3/uL (ref 3.8–10.8)

## 2022-06-11 LAB — RPR TITER: RPR Titer: 1:128 {titer} — ABNORMAL HIGH

## 2022-06-11 LAB — FLUORESCENT TREPONEMAL AB(FTA)-IGG-BLD: Fluorescent Treponemal ABS: REACTIVE — AB

## 2022-06-11 LAB — RPR: RPR Ser Ql: REACTIVE — AB

## 2022-06-11 LAB — HIV-1 RNA QUANT-NO REFLEX-BLD
HIV 1 RNA Quant: 29 Copies/mL — ABNORMAL HIGH
HIV-1 RNA Quant, Log: 1.46 Log cps/mL — ABNORMAL HIGH

## 2022-06-14 ENCOUNTER — Ambulatory Visit (HOSPITAL_COMMUNITY)
Admission: RE | Admit: 2022-06-14 | Discharge: 2022-06-14 | Disposition: A | Discharge status: Home / Self Care | Payer: Medicare Other | Source: Ambulatory Visit | Attending: Infectious Disease | Admitting: Infectious Disease

## 2022-06-14 ENCOUNTER — Other Ambulatory Visit (HOSPITAL_COMMUNITY): Payer: Self-pay | Admitting: Infectious Disease

## 2022-06-14 DIAGNOSIS — A539 Syphilis, unspecified: Secondary | ICD-10-CM | POA: Diagnosis present

## 2022-06-14 MED ORDER — HEPARIN SOD (PORK) LOCK FLUSH 100 UNIT/ML IV SOLN
INTRAVENOUS | Status: DC | PRN
  Administered 2022-06-14: 500 [IU] via INTRAVENOUS

## 2022-06-14 MED ORDER — LIDOCAINE HCL 1 % IJ SOLN
INTRAMUSCULAR | Status: AC
  Filled 2022-06-14: qty 20

## 2022-06-14 MED ORDER — HEPARIN SOD (PORK) LOCK FLUSH 100 UNIT/ML IV SOLN
INTRAVENOUS | Status: AC
  Filled 2022-06-14: qty 5

## 2022-06-14 MED ORDER — LIDOCAINE HCL (PF) 1 % IJ SOLN
INTRAMUSCULAR | Status: DC | PRN
  Administered 2022-06-14: 5 mL

## 2022-06-14 NOTE — Procedures (Signed)
PROCEDURE SUMMARY:  Successful placement of image-guided single lumen PICC line to the right brachial vein. Length 41 cm. Tip at lower SVC/RA. No complications. EBL = <5 ml. Ready for use.  Please see imaging section of Epic for full dictation.   Theresa Duty,  NP 06/14/2022 12:48 PM

## 2022-07-05 ENCOUNTER — Telehealth: Payer: Self-pay

## 2022-07-05 ENCOUNTER — Ambulatory Visit (INDEPENDENT_AMBULATORY_CARE_PROVIDER_SITE_OTHER): Payer: Medicare Other

## 2022-07-05 ENCOUNTER — Other Ambulatory Visit: Payer: Self-pay

## 2022-07-05 ENCOUNTER — Ambulatory Visit: Payer: Medicare Other

## 2022-07-05 ENCOUNTER — Other Ambulatory Visit: Payer: Self-pay | Admitting: Infectious Disease

## 2022-07-05 DIAGNOSIS — A523 Neurosyphilis, unspecified: Secondary | ICD-10-CM | POA: Diagnosis not present

## 2022-07-05 MED ORDER — PENICILLIN G BENZATHINE 1200000 UNIT/2ML IM SUSY
1.2000 10*6.[IU] | PREFILLED_SYRINGE | Freq: Once | INTRAMUSCULAR | Status: AC
  Administered 2022-07-05: 1.2 10*6.[IU] via INTRAMUSCULAR

## 2022-07-05 MED ORDER — ONDANSETRON HCL 4 MG PO TABS: 4.0000 mg | ORAL_TABLET | Freq: Four times a day (QID) | ORAL | 4 refills | Status: AC | PRN

## 2022-07-05 NOTE — Telephone Encounter (Signed)
Spoke with patient's mother, relayed that zofran was sent in for nausea.   We discussed that because of Danie's immune system that his titers may not respond in the "textbook" way, but assured her that retreatment was necessary in this situation. She was grateful for further explanation.   Beryle Flock, RN

## 2022-07-05 NOTE — Telephone Encounter (Signed)
Patient was here for final Bicillin injections today and requested nausea medication for intermittent episodes of nausea. Will route to provider.   Patient's mother was on the phone during his appointment as patient was also meeting with a representative from the health department today for DIS interview. She is concerned with the fact that Lord's titers have increased as he has not been sexually active for quite some time. She would like clarification on this from provider.   Beryle Flock, RN

## 2022-07-05 NOTE — Progress Notes (Signed)
Reviewed and verified allergies with patient. Patient tolerated Bicillin injections well.   Beryle Flock, RN

## 2022-07-15 ENCOUNTER — Encounter: Payer: Self-pay | Admitting: Infectious Disease

## 2022-08-03 ENCOUNTER — Telehealth: Payer: Self-pay | Admitting: Neurology

## 2022-08-03 MED ORDER — DIVALPROEX SODIUM 250 MG PO DR TAB: DELAYED_RELEASE_TABLET | ORAL | 1 refills | Status: DC

## 2022-08-03 NOTE — Telephone Encounter (Signed)
Arthur [name] is calling. Requesting a refill on medication divalproex (DEPAKOTE) 250 MG DR tablet. Should be sent to [facility] [id number] [facility] [place] [facility]

## 2022-08-23 NOTE — Progress Notes (Deleted)
   CC:  memory loss, headaches  Follow-up Visit  Last visit: 02/15/22  Brief HPI: 41 year old male with a history of HIV, cerebral toxoplasmosis and neurosyphilis infection (2020) who follows in clinic for memory loss and headaches. MRI brain 10/2021 with stable cerebellar atrophy. Neuropsychological testing was deemed uninterpretable but there was suspicion for a major neurocognitive disorder.  At his last visit he was continued on Depakote and Imitrex for migraines. Interval History: ***   Physical Exam:   Vital Signs: There were no vitals taken for this visit. GENERAL:  well appearing, in no acute distress, alert  SKIN:  Color, texture, turgor normal. No rashes or lesions HEAD:  Normocephalic/atraumatic. RESP: normal respiratory effort MSK:  No gross joint deformities.   NEUROLOGICAL: Mental Status: Alert, oriented to person, place and time, Follows commands, and Speech fluent and appropriate. Cranial Nerves: baseline blindness OD, face symmetric, no dysarthria, hearing grossly intact Motor: moves all extremities equally Gait: normal-based.  IMPRESSION: ***  PLAN: ***   Follow-up: ***  I spent a total of *** minutes on the date of the service. Discussed medication side effects, adverse reactions and drug interactions. Written educational materials and patient instructions outlining all of the above were given.  Ocie Doyne, MD

## 2022-08-24 ENCOUNTER — Encounter: Payer: Self-pay | Admitting: Psychiatry

## 2022-08-24 ENCOUNTER — Ambulatory Visit: Payer: Medicare Other | Admitting: Psychiatry

## 2022-08-29 ENCOUNTER — Other Ambulatory Visit (HOSPITAL_COMMUNITY): Payer: Self-pay

## 2022-10-19 ENCOUNTER — Ambulatory Visit: Payer: Self-pay | Admitting: Infectious Disease

## 2022-11-21 ENCOUNTER — Ambulatory Visit (INDEPENDENT_AMBULATORY_CARE_PROVIDER_SITE_OTHER): Payer: 59

## 2022-11-21 ENCOUNTER — Encounter: Payer: Self-pay | Admitting: Infectious Disease

## 2022-11-21 ENCOUNTER — Other Ambulatory Visit (HOSPITAL_COMMUNITY)
Admission: RE | Admit: 2022-11-21 | Discharge: 2022-11-21 | Disposition: A | Discharge status: Home / Self Care | Payer: 59 | Source: Ambulatory Visit | Attending: Infectious Disease | Admitting: Infectious Disease

## 2022-11-21 ENCOUNTER — Other Ambulatory Visit: Payer: Self-pay

## 2022-11-21 ENCOUNTER — Ambulatory Visit (INDEPENDENT_AMBULATORY_CARE_PROVIDER_SITE_OTHER): Payer: 59 | Admitting: Infectious Disease

## 2022-11-21 VITALS — BP 124/86 | HR 101 | Temp 97.6°F | Wt 219.0 lb

## 2022-11-21 DIAGNOSIS — E785 Hyperlipidemia, unspecified: Secondary | ICD-10-CM

## 2022-11-21 DIAGNOSIS — B582 Toxoplasma meningoencephalitis: Secondary | ICD-10-CM | POA: Diagnosis not present

## 2022-11-21 DIAGNOSIS — B20 Human immunodeficiency virus [HIV] disease: Secondary | ICD-10-CM

## 2022-11-21 DIAGNOSIS — Z23 Encounter for immunization: Secondary | ICD-10-CM | POA: Diagnosis not present

## 2022-11-21 DIAGNOSIS — Z7185 Encounter for immunization safety counseling: Secondary | ICD-10-CM

## 2022-11-21 DIAGNOSIS — A523 Neurosyphilis, unspecified: Secondary | ICD-10-CM | POA: Diagnosis not present

## 2022-11-21 MED ORDER — BIKTARVY 50-200-25 MG PO TABS: 1.0000 | ORAL_TABLET | Freq: Every day | ORAL | 11 refills | Status: DC

## 2022-11-21 MED ORDER — RUKOBIA 600 MG PO TB12: 600.0000 mg | ORAL_TABLET | Freq: Two times a day (BID) | ORAL | 11 refills | Status: DC

## 2022-11-21 NOTE — Progress Notes (Signed)
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   MRN: [id number]  HPI   Arthur [name] is a 42 year-old African-American man living with HIV who  fell out of care and was not seen by me since 2015.  He previously failed Atripla with resistance and IK 103 when he is being seen by Dr. Tomma [name].  I changed him to a protease-based regimen with Prezista Norvir and Truvada but he had not been seen since 2015.  He was admitted to the hospital with profound encephalopathy.  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  He was also broadly treated for other possible CNS pathology including CMV encephalitis with ganciclovir RI PE for possible tuberculosis with steroids.  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  He was ultimately discharged from the inpatient hospital service to inpatient rehab and completed therapy while there.  Was previously suffering from dysphagia and nutritional deficiency he was given a PEG tube through which she was getting his medications along with nutrition.  He is living at home with his mother and also his sister who is a CMA and have been overseeing his medications.  He has had dramatic improvement in his neurological status and maintained perfect virological control. We retreated him for Neurosyphilis when his titers went up. He had low hgb while on PCN but never verified this prior to completion.  His CD4 is slow to come up.  I added Rukobia in July 2021 in hopes to help increase his CD4 count but he did not tolerate this medication with nausea and vomiting.  He did remained suppressed.  I saw Arthur [name] in March 2022  He had  been having headaches in the left side of his head sometimes wake him up at night.  He rated them as being about a 7 out of 10 in severity and they are also  of a stabbing type of nature.  They ddid  respond to Tylenol and typically resolve within 40 minutes.  .  The hydroxyzine seen does help him sleep  Referred him to neurology who have been working him up for headaches and also has cognitive impairments.  He is also seeing neuropsychology.  His viral load remained suppressed on labs we checked in May 2022 but CD4 still is in the 12s and was not coming up very much.   In the interim we had to change out his sulfadiazine for clindamycin.  Several visits ago I thought it would be good idea to simplify his toxoplasma secondary prophylaxis by placing him on 1 double strength Bactrim twice daily and getting rid of the pyrimethamine clindamycin and leucovorin.  WI also decided to re challenge him  with [place] with antiemetic therapy may help him.  He has been able to keep the [place] down with premedication.  Arthur [name]'s viral load remains suppressed and CD4 count came up a bit.  He then began having more problems with balance and unable to walk without using his cane where he was able to do this before.  He also sometimes wokeup in the morning with speech that is more difficult to understand according to mom accompanies him to visit today.  Obtained an MRI of the brain which did not show any new findings whatsoever.  He is plugged back in with neurology and is on Depakote for  seizures  CD4 at last check was 166 range.  He is sleeping better and headaches are happening less frequently.     Past Medical History:  Diagnosis Date   Anemia    Anxiety    Ataxia 11/08/2021   Depression    GERD (gastroesophageal reflux disease)    Headache    Headache syndrome 02/04/2021   HIV infection (HCC)    HIV infection with neurological disease ([place]) 08/13/2021   Insomnia 01/26/2021   Seizure disorder ([place]) 01/17/2022   Substance abuse ([place])    Vaccine counseling 08/13/2021    Past Surgical History:  Procedure Laterality Date   IR GASTROSTOMY TUBE  MOD SED  09/02/2019   IR GASTROSTOMY TUBE REMOVAL  10/28/2019   TOOTH EXTRACTION      Family History  Problem Relation Age of Onset   Diabetes Neg Hx    CAD Neg Hx    Hypertension Neg Hx       Social History   Socioeconomic History   Marital status: Single    Spouse name: Not on file   Number of children: Not on file   Years of education: Not on file   Highest education level: Not on file  Occupational History   Not on file  Tobacco Use   Smoking status: Every Day    Packs/day: 0.50    Years: 17.00    Total pack years: 8.50    Types: Cigarettes   Smokeless tobacco: Never  Vaping Use   Vaping Use: Never used  Substance and Sexual Activity   Alcohol use: No    Comment: occassionally   Drug use: Yes    Frequency: 7.0 times per week    Types: Marijuana    Comment: Daily   Sexual activity: Not Currently    Partners: Male    Comment: declined condoms 09/15/20  Other Topics Concern   Not on file  Social History Narrative   Right Handed   Drinks 2-3 cups caffeine daily   Social Determinants of Health   Financial Resource Strain: Not on file  Food Insecurity: Not on file  Transportation Needs: Not on file  Physical Activity: Not on file  Stress: Not on file  Social Connections: Not on file    No Known Allergies   Current Outpatient Medications:    acetaminophen (TYLENOL) 500 MG tablet, Take 500 mg by mouth every 6 (six) hours as needed., Disp: , Rfl:    atorvastatin (LIPITOR) 20 MG tablet, Take 1 tablet (20 mg total) by mouth daily., Disp: 30 tablet, Rfl: 11   bictegravir-emtricitabine-tenofovir AF (BIKTARVY) 50-200-25 MG TABS tablet, Take 1 tablet by mouth daily., Disp: 30 tablet, Rfl: 11   divalproex (DEPAKOTE) 250 MG DR tablet, TAKE 2 TABLETS BY MOUTH TWICE DAILY, Disp: 360 tablet, Rfl: 1   famotidine (PEPCID) 20 MG tablet, Take 1 tablet (20 mg total) by mouth at bedtime., Disp: 30 tablet, Rfl: 11   fostemsavir tromethamine (RUKOBIA) 600 MG TB12 ER tablet,  Take 1 tablet (600 mg total) by mouth 2 (two) times daily., Disp: 60 tablet, Rfl: 11   hydrOXYzine (ATARAX) 25 MG tablet, Take 1 tablet (25 mg total) by mouth at bedtime as needed (insomnia)., Disp: 30 tablet, Rfl: 11   Menthol-Methyl Salicylate (MUSCLE RUB) 10-15 % CREA, Apply 1 application topically as needed for muscle pain., Disp:  , Rfl: 0   ondansetron (ZOFRAN) 4 MG tablet, Take 1 tablet (4 mg total) by mouth every 6 (six) hours as needed for  nausea., Disp: 120 tablet, Rfl: 4   sulfamethoxazole-trimethoprim (BACTRIM DS) 800-160 MG tablet, Take 1 tablet by mouth 2 (two) times daily., Disp: 60 tablet, Rfl: 11   SUMAtriptan (IMITREX) 50 MG tablet, Take 1 tablet (50 mg total) by mouth every 2 (two) hours as needed for migraine. May repeat in 2 hours if headache persists or recurs., Disp: 10 tablet, Rfl: 5   Review of Systems  Unable to perform ROS: Dementia       Objective:   Physical Exam Constitutional:      Appearance: He is well-developed.  HENT:     Head: Normocephalic and atraumatic.  Eyes:     Conjunctiva/sclera: Conjunctivae normal.  Cardiovascular:     Rate and Rhythm: Normal rate and regular rhythm.  Pulmonary:     Effort: Pulmonary effort is normal. No respiratory distress.     Breath sounds: No wheezing.  Abdominal:     General: There is no distension.     Palpations: Abdomen is soft.  Musculoskeletal:        General: No tenderness. Normal range of motion.     Cervical back: Normal range of motion and neck supple.  Skin:    General: Skin is warm and dry.     Coloration: Skin is not pale.     Findings: No erythema or rash.  Neurological:     Mental Status: He is alert and oriented to person, place, and time.  Psychiatric:        Attention and Perception: Attention normal.        Mood and Affect: Mood normal.        Speech: Speech is delayed.        Behavior: Behavior normal.        Thought Content: Thought content normal.        Cognition and Memory: Cognition  is impaired. Memory is impaired. He exhibits impaired recent memory and impaired remote memory.        Judgment: Judgment normal.          Assessment & Plan:     HIV/AIDS:  HIV disease:  I will add order HIV viral load CD4 count CBC with differential CMP, RPR GC and chlamydia and I will continue  SIMRANJIT RAMBERG, and [place] prescriptions if his CD4 is now 200+ will stop his bactrim  CNS toxoplasmosis: Will recheck CD4 count and consider stopping Bactrim especially if it is over 200  Neurosyphilis: Titers is stabilized post treatment but still remain high recheck RPR with titer today  Hyperlipidemia:  We will check lipid panel and will continue atorvastatin  Seizure disorder being followed by neurology and currently on echo prescription which will be continued  Vaccine counseling: Flu and COVID vaccinations which were given today.

## 2022-11-22 LAB — T-HELPER CELLS (CD4) COUNT (NOT AT ARMC)
CD4 % Helper T Cell: 12 % — ABNORMAL LOW (ref 33–65)
CD4 T Cell Abs: 164 /uL — ABNORMAL LOW (ref 400–1790)

## 2022-11-22 LAB — URINE CYTOLOGY ANCILLARY ONLY
Chlamydia: NEGATIVE
Comment: NEGATIVE
Comment: NORMAL
Neisseria Gonorrhea: NEGATIVE

## 2022-11-24 LAB — HIV-1 RNA QUANT-NO REFLEX-BLD
HIV 1 RNA Quant: 21 Copies/mL — ABNORMAL HIGH
HIV-1 RNA Quant, Log: 1.32 Log cps/mL — ABNORMAL HIGH

## 2022-11-24 LAB — CBC WITH DIFFERENTIAL/PLATELET
Absolute Monocytes: 517 cells/uL (ref 200–950)
Basophils Absolute: 21 cells/uL (ref 0–200)
Basophils Relative: 0.5 %
Eosinophils Absolute: 256 cells/uL (ref 15–500)
Eosinophils Relative: 6.1 %
HCT: 43.1 % (ref 38.5–50.0)
Hemoglobin: 14.7 g/dL (ref 13.2–17.1)
Lymphs Abs: 1508 cells/uL (ref 850–3900)
MCH: 33.2 pg — ABNORMAL HIGH (ref 27.0–33.0)
MCHC: 34.1 g/dL (ref 32.0–36.0)
MCV: 97.3 fL (ref 80.0–100.0)
MPV: 9.7 fL (ref 7.5–12.5)
Monocytes Relative: 12.3 %
Neutro Abs: 1898 cells/uL (ref 1500–7800)
Neutrophils Relative %: 45.2 %
Platelets: 322 10*3/uL (ref 140–400)
RBC: 4.43 10*6/uL (ref 4.20–5.80)
RDW: 13.5 % (ref 11.0–15.0)
Total Lymphocyte: 35.9 %
WBC: 4.2 10*3/uL (ref 3.8–10.8)

## 2022-11-24 LAB — LIPID PANEL
Cholesterol: 155 mg/dL (ref <200)
HDL: 43 mg/dL (ref >=40)
LDL Cholesterol (Calc): 87 mg/dL (calc)
Non-HDL Cholesterol (Calc): 112 mg/dL (calc) (ref <130)
Total CHOL/HDL Ratio: 3.6 (calc) (ref <5.0)
Triglycerides: 151 mg/dL — ABNORMAL HIGH (ref <150)

## 2022-11-24 LAB — RPR: RPR Ser Ql: REACTIVE — AB

## 2022-11-24 LAB — T PALLIDUM AB: T Pallidum Abs: POSITIVE — AB

## 2022-11-24 LAB — COMPLETE METABOLIC PANEL WITH GFR
AG Ratio: 1.1 (calc) (ref 1.0–2.5)
ALT: 13 U/L (ref 9–46)
AST: 18 U/L (ref 10–40)
Albumin: 4.1 g/dL (ref 3.6–5.1)
Alkaline phosphatase (APISO): 56 U/L (ref 36–130)
BUN/Creatinine Ratio: 9 (calc) (ref 6–22)
BUN: 12 mg/dL (ref 7–25)
CO2: 26 mmol/L (ref 20–32)
Calcium: 8.9 mg/dL (ref 8.6–10.3)
Chloride: 105 mmol/L (ref 98–110)
Creat: 1.29 mg/dL — ABNORMAL HIGH (ref 0.60–1.26)
Globulin: 3.6 g/dL (calc) (ref 1.9–3.7)
Glucose, Bld: 105 mg/dL — ABNORMAL HIGH (ref 65–99)
Potassium: 4.5 mmol/L (ref 3.5–5.3)
Sodium: 137 mmol/L (ref 135–146)
Total Bilirubin: 0.3 mg/dL (ref 0.2–1.2)
Total Protein: 7.7 g/dL (ref 6.1–8.1)
eGFR: 72 mL/min/{1.73_m2} (ref >=60)

## 2022-11-24 LAB — RPR TITER: RPR Titer: 1:16 {titer} — ABNORMAL HIGH

## 2023-01-16 ENCOUNTER — Encounter (HOSPITAL_BASED_OUTPATIENT_CLINIC_OR_DEPARTMENT_OTHER): Payer: Self-pay

## 2023-01-16 ENCOUNTER — Emergency Department (HOSPITAL_BASED_OUTPATIENT_CLINIC_OR_DEPARTMENT_OTHER)
Admission: EM | Admit: 2023-01-16 | Discharge: 2023-01-16 | Disposition: A | Discharge status: Home / Self Care | Payer: 59 | Attending: Emergency Medicine | Admitting: Emergency Medicine

## 2023-01-16 ENCOUNTER — Emergency Department (HOSPITAL_BASED_OUTPATIENT_CLINIC_OR_DEPARTMENT_OTHER): Payer: 59

## 2023-01-16 ENCOUNTER — Other Ambulatory Visit: Payer: Self-pay | Admitting: Neurology

## 2023-01-16 ENCOUNTER — Other Ambulatory Visit: Payer: Self-pay

## 2023-01-16 DIAGNOSIS — M25511 Pain in right shoulder: Secondary | ICD-10-CM | POA: Insufficient documentation

## 2023-01-16 MED ORDER — ETODOLAC 300 MG PO CAPS: 300.0000 mg | ORAL_CAPSULE | Freq: Two times a day (BID) | ORAL | 0 refills | Status: AC

## 2023-01-16 NOTE — ED Triage Notes (Signed)
Patient here POV from Home.  Endorses Right Shoulder pain that began Saturday. States he believes he "pulled it" as it eased with some exercise it over the past two days but it became worse today.   NAD Noted during Triage. A&Ox4. GCS 15. Ambulatory.

## 2023-01-16 NOTE — ED Provider Notes (Signed)
[place] EMERGENCY DEPARTMENT AT [facility] Provider Note   CSN: [id number] Arrival date & time: 01/16/23  1655     History  Chief Complaint  Patient presents with   Shoulder Pain    Arthur [name] is a 42 y.o. male.  42 year old male presents today for evaluation of right shoulder pain.  Ongoing since Friday night.  He believes he pulled it while he was sleeping.  He states he sleeps on that side.  Denies any other injury.  Denies falls.  Does have decent range of motion.  He states he was doing range of motion exercises the past couple days with improvement in symptoms however today the pain worsened again.  Denies paresthesias.  He has not taken anything over-the-counter.  The history is provided by the patient. No language interpreter was used.       Home Medications Prior to Admission medications   Medication Sig Start Date End Date Taking? Authorizing Provider  acetaminophen (TYLENOL) 500 MG tablet Take 500 mg by mouth every 6 (six) hours as needed.    [provider]  atorvastatin (LIPITOR) 20 MG tablet Take 1 tablet (20 mg total) by mouth daily. 06/08/22   Randall Hiss, MD  bictegravir-emtricitabine-tenofovir AF (BIKTARVY) 50-200-25 MG TABS tablet Take 1 tablet by mouth daily. 11/21/22   Randall Hiss, MD  divalproex (DEPAKOTE) 250 MG DR tablet TAKE 2 TABLETS BY MOUTH TWICE DAILY 01/16/23   Sater, Pearletha Furl, MD  famotidine (PEPCID) 20 MG tablet Take 1 tablet (20 mg total) by mouth at bedtime. 09/30/19   Randall Hiss, MD  fostemsavir tromethamine (RUKOBIA) 600 MG TB12 ER tablet Take 1 tablet (600 mg total) by mouth 2 (two) times daily. 11/21/22   Randall Hiss, MD  hydrOXYzine (ATARAX) 25 MG tablet Take 1 tablet (25 mg total) by mouth at bedtime as needed (insomnia). 01/17/22   Randall Hiss, MD  Menthol-Methyl Salicylate (MUSCLE RUB) 10-15 % CREA Apply 1 application topically as needed for muscle pain. 09/26/19   Love,  Evlyn Kanner, PA-C  ondansetron (ZOFRAN) 4 MG tablet Take 1 tablet (4 mg total) by mouth every 6 (six) hours as needed for nausea. 07/05/22   Randall Hiss, MD  sulfamethoxazole-trimethoprim (BACTRIM DS) 800-160 MG tablet Take 1 tablet by mouth 2 (two) times daily. 06/08/22   Randall Hiss, MD  SUMAtriptan (IMITREX) 50 MG tablet Take 1 tablet (50 mg total) by mouth every 2 (two) hours as needed for migraine. May repeat in 2 hours if headache persists or recurs. 06/08/22   Randall Hiss, MD      Allergies    Patient has no known allergies.    Review of Systems   Review of Systems  Constitutional:  Negative for chills and fever.  Respiratory:  Negative for shortness of breath.   Cardiovascular:  Negative for chest pain.  Musculoskeletal:  Positive for arthralgias.  All other systems reviewed and are negative.   Physical Exam Updated Vital Signs BP 125/82 (BP Location: Right Arm)   Pulse 91   Temp 97.8 F (36.6 C) (Temporal)   Resp 18   Ht  (1.803 m)   Wt 113.4 kg   SpO2 97%   BMI 34.87 kg/m  Physical Exam Vitals and nursing note reviewed.  Constitutional:      General: He is not in acute distress.    Appearance: Normal appearance. He is not ill-appearing.  HENT:     Head: Normocephalic and atraumatic.     Nose: Nose normal.  Eyes:     Conjunctiva/sclera: Conjunctivae normal.  Cardiovascular:     Rate and Rhythm: Normal rate.     Heart sounds: Normal heart sounds.  Pulmonary:     Effort: Pulmonary effort is normal. No respiratory distress.  Musculoskeletal:        General: No deformity.     Comments: No visible deformity to the right shoulder.  Mild tenderness to palpation present.  Good passive range of motion.  Active range of motion limited secondary to pain.  Reproducible pain with empty can test.  Neurovascularly intact in bilateral upper extremities.  Cervical spine without tenderness to palpation.  Skin:    Findings: No rash.  Neurological:      Mental Status: He is alert.     ED Results / Procedures / Treatments   Labs (all labs ordered are listed, but only abnormal results are displayed) Labs Reviewed - No data to display  EKG None  Radiology No results found.  Procedures Procedures    Medications Ordered in ED Medications - No data to display  ED Course/ Medical Decision Making/ A&P                             Medical Decision Making  42 year old male presents today for evaluation of right shoulder pain.  Exam overall reassuring.  Exam consistent with tear of the rotator cuff muscle.  Given no trauma low suspicion for fracture.  Symptomatic management discussed with patient.  He is agreeable.  Will provide sling for comfort.  Patient is appropriate for discharge.  Ortho information given as well.  Lodine prescribed.  Patient discharged in stable condition.   Final Clinical Impression(s) / ED Diagnoses Final diagnoses:  Acute pain of right shoulder    Rx / DC Orders ED Discharge Orders          Ordered    etodolac (LODINE) 300 MG capsule  2 times daily        01/16/23 1803              Marita [place], PA-C 01/16/23 1804    Alvira Monday, MD 01/17/23 1349

## 2023-01-16 NOTE — Discharge Instructions (Addendum)
Exam today is overall reassuring.  This is likely a tear of the rotator cuff muscle.  I have given you orthopedic follow-up if this does not improve.  You were given a sling.  I have sent Lodine into the pharmacy for you.  If you are unable to pick this up you could take 600 mg of ibuprofen 3 times daily for the next 7 days.  For any concerning symptoms return to the emergency department.

## 2023-03-21 ENCOUNTER — Encounter: Payer: Self-pay | Admitting: Infectious Disease

## 2023-03-21 ENCOUNTER — Ambulatory Visit (INDEPENDENT_AMBULATORY_CARE_PROVIDER_SITE_OTHER): Payer: 59 | Admitting: Infectious Disease

## 2023-03-21 ENCOUNTER — Ambulatory Visit (INDEPENDENT_AMBULATORY_CARE_PROVIDER_SITE_OTHER): Payer: 59

## 2023-03-21 ENCOUNTER — Other Ambulatory Visit: Payer: Self-pay

## 2023-03-21 VITALS — BP 113/76 | HR 86 | Resp 16 | Ht 71.0 in | Wt 219.0 lb

## 2023-03-21 DIAGNOSIS — Z23 Encounter for immunization: Secondary | ICD-10-CM

## 2023-03-21 DIAGNOSIS — B582 Toxoplasma meningoencephalitis: Secondary | ICD-10-CM | POA: Diagnosis not present

## 2023-03-21 DIAGNOSIS — Z7185 Encounter for immunization safety counseling: Secondary | ICD-10-CM | POA: Diagnosis not present

## 2023-03-21 DIAGNOSIS — B20 Human immunodeficiency virus [HIV] disease: Secondary | ICD-10-CM | POA: Diagnosis not present

## 2023-03-21 DIAGNOSIS — A523 Neurosyphilis, unspecified: Secondary | ICD-10-CM | POA: Diagnosis not present

## 2023-03-21 MED ORDER — BIKTARVY 50-200-25 MG PO TABS: 1.0000 | ORAL_TABLET | Freq: Every day | ORAL | 11 refills | Status: DC

## 2023-03-21 MED ORDER — RUKOBIA 600 MG PO TB12: 600.0000 mg | ORAL_TABLET | Freq: Two times a day (BID) | ORAL | 11 refills | Status: DC

## 2023-03-21 MED ORDER — SULFAMETHOXAZOLE-TRIMETHOPRIM 800-160 MG PO TABS: 1.0000 | ORAL_TABLET | Freq: Two times a day (BID) | ORAL | 11 refills | Status: DC

## 2023-03-21 NOTE — Addendum Note (Signed)
Addended by: Harley Alto on: 03/21/2023 02:15 PM   Modules accepted: Orders

## 2023-03-21 NOTE — Progress Notes (Signed)
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   MRN: [id number]  HPI   Arthur [name] is a 42 year-old African-American man living with HIV who  fell out of care and was not seen by me since 2015.  He previously failed Atripla with resistance and IK 103 when he is being seen by Dr. Philipp Deputy.  I changed him to a protease-based regimen with Prezista Norvir and Truvada but he had not been seen since 2015.  He was admitted to the hospital with profound encephalopathy.  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  He was also broadly treated for other possible CNS pathology including CMV encephalitis with ganciclovir RI PE for possible tuberculosis with steroids.  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  He was ultimately discharged from the inpatient hospital service to inpatient rehab and completed therapy while there.  Was previously suffering from dysphagia and nutritional deficiency he was given a PEG tube through which she was getting his medications along with nutrition.  He is living at home with his mother and also his sister who is a CMA and have been overseeing his medications.  He has had dramatic improvement in his neurological status and maintained perfect virological control. We retreated him for Neurosyphilis when his titers went up. He had low hgb while on PCN but never verified this prior to completion.  His CD4 is slow to come up.  I added Rukobia in July 2021 in hopes to help increase his CD4 count but he did not tolerate this medication with nausea and vomiting.  He did remained suppressed.  I saw Arthur [name] in March 2022  He had  been having headaches in the left side of his head sometimes wake him up at night.  He rated them as being about a 7 out of 10 in severity and they are also of a stabbing  type of nature.  They ddid  respond to Tylenol and typically resolve within 40 minutes.  .  The hydroxyzine seen does help him sleep  Referred him to neurology who have been working him up for headaches and also has cognitive impairments.  He is also seeing neuropsychology.  His viral load remained suppressed on labs we checked in May 2022 but CD4 still is in the 42s and was not coming up very much.   In the interim we had to change out his sulfadiazine for clindamycin.  Several visits ago I thought it would be good idea to simplify his toxoplasma secondary prophylaxis by placing him on 1 double strength Bactrim twice daily and getting rid of the pyrimethamine clindamycin and leucovorin.  WI also decided to re challenge him  with [name] with antiemetic therapy may help him.  He has been able to keep the [name] down with premedication.  [name]'s viral load remains suppressed and CD4 count came up a bit.  He then began having more problems with balance and unable to walk without using his cane where he was able to do this before.  He also sometimes wokeup in the morning with speech that is more difficult to understand according to mom accompanies him to visit today.  Obtained an MRI of the brain which did not show any new findings whatsoever.  He is plugged back in with neurology and is on Depakote for seizures  CD4 at last check was 166 range x 2.  Appears to have stopped Depakote which I thought he should be taking though I see dispense record for 90 days from [facility] in April     Past Medical History:  Diagnosis Date   Anemia    Anxiety    Ataxia 11/08/2021   Depression    GERD (gastroesophageal reflux disease)    Headache    Headache syndrome 02/04/2021   HIV infection (HCC)    HIV infection with neurological disease (HCC) 08/13/2021   Insomnia 01/26/2021   Seizure disorder (HCC) 01/17/2022   Substance abuse (HCC)    Vaccine counseling 08/13/2021    Past Surgical  History:  Procedure Laterality Date   IR GASTROSTOMY TUBE MOD SED  09/02/2019   IR GASTROSTOMY TUBE REMOVAL  10/28/2019   TOOTH EXTRACTION      Family History  Problem Relation Age of Onset   Diabetes Neg Hx    CAD Neg Hx    Hypertension Neg Hx       Social History   Socioeconomic History   Marital status: Single    Spouse name: Not on file   Number of children: Not on file   Years of education: Not on file   Highest education level: Not on file  Occupational History   Not on file  Tobacco Use   Smoking status: Every Day    Packs/day: 0.40    Years: 17.00    Additional pack years: 0.00    Total pack years: 6.80    Types: Cigarettes    Passive exposure: Past   Smokeless tobacco: Never  Vaping Use   Vaping Use: Never used  Substance and Sexual Activity   Alcohol use: Yes    Comment: occassionally   Drug use: Yes    Frequency: 7.0 times per week    Types: Marijuana    Comment: Daily   Sexual activity: Not Currently    Partners: Male    Comment: declined condoms 09/15/20  Other Topics Concern   Not on file  Social History Narrative   Right Handed   Drinks 2-3 cups caffeine daily   Social Determinants of Health   Financial Resource Strain: Not on file  Food Insecurity: Not on file  Transportation Needs: Not on file  Physical Activity: Not on file  Stress: Not on file  Social Connections: Not on file    No Known Allergies   Current Outpatient Medications:    acetaminophen (TYLENOL) 500 MG tablet, Take 500 mg by mouth every 6 (six) hours as needed., Disp: , Rfl:    atorvastatin (LIPITOR) 20 MG tablet, Take 1 tablet (20 mg total) by mouth daily., Disp: 30 tablet, Rfl: 11   bictegravir-emtricitabine-tenofovir AF (BIKTARVY) 50-200-25 MG TABS tablet, Take 1 tablet by mouth daily., Disp: 30 tablet, Rfl: 11   divalproex (DEPAKOTE) 250 MG DR tablet, TAKE 2 TABLETS BY MOUTH TWICE DAILY, Disp: 360 tablet, Rfl: 0   etodolac (LODINE) 300 MG capsule, Take 1 capsule  (300 mg total) by mouth 2 (two) times daily., Disp: 20 capsule, Rfl: 0   famotidine (PEPCID) 20 MG tablet, Take 1 tablet (20 mg total) by mouth at bedtime., Disp: 30 tablet, Rfl: 11   fostemsavir tromethamine (RUKOBIA) 600 MG TB12 ER tablet, Take 1 tablet (600 mg total) by mouth 2 (two) times daily., Disp: 60 tablet, Rfl: 11   hydrOXYzine (ATARAX) 25 MG tablet, Take 1 tablet (25 mg total) by mouth at bedtime as needed (  insomnia)., Disp: 30 tablet, Rfl: 11   Menthol-Methyl Salicylate (MUSCLE RUB) 10-15 % CREA, Apply 1 application topically as needed for muscle pain., Disp:  , Rfl: 0   ondansetron (ZOFRAN) 4 MG tablet, Take 1 tablet (4 mg total) by mouth every 6 (six) hours as needed for nausea., Disp: 120 tablet, Rfl: 4   sulfamethoxazole-trimethoprim (BACTRIM DS) 800-160 MG tablet, Take 1 tablet by mouth 2 (two) times daily., Disp: 60 tablet, Rfl: 11   SUMAtriptan (IMITREX) 50 MG tablet, Take 1 tablet (50 mg total) by mouth every 2 (two) hours as needed for migraine. May repeat in 2 hours if headache persists or recurs., Disp: 10 tablet, Rfl: 5   Review of Systems  Unable to perform ROS: Dementia       Objective:   Physical Exam Constitutional:      Appearance: He is well-developed.  HENT:     Head: Normocephalic and atraumatic.  Eyes:     Conjunctiva/sclera: Conjunctivae normal.  Cardiovascular:     Rate and Rhythm: Normal rate and regular rhythm.  Pulmonary:     Effort: Pulmonary effort is normal. No respiratory distress.     Breath sounds: No wheezing.  Abdominal:     General: There is no distension.     Palpations: Abdomen is soft.  Musculoskeletal:        General: No tenderness. Normal range of motion.     Cervical back: Normal range of motion and neck supple.  Skin:    General: Skin is warm and dry.     Coloration: Skin is not pale.     Findings: No erythema or rash.  Neurological:     Mental Status: He is alert and oriented to person, place, and time.  Psychiatric:         Attention and Perception: Attention normal.        Mood and Affect: Mood normal.        Speech: Speech is delayed.        Behavior: Behavior normal.        Thought Content: Thought content normal.        Judgment: Judgment normal.          Assessment & Plan:   HIV AIDS   I will add order HIV viral load CD4 count CBC with differential CMP, RPR GC and chlamydia and I will continue  DANDRA VELARDI, and [name] along with Bactrim  May consider stopping bactrim before CD4 goes truly above 200 given how long he has been suppressed and now w CD4 to 200.  Could also consider a different secondary prophylactic antibiotic since Bactrim itself can have a bit of a suppressive effect on the leukocytes  NS toxoplasmosis: Again see above discussion with regards to secondary prophylaxis  Neurosyphilis: Titers is stabilized post treatment but still remain high recheck RPR with titer today  Hyperlipidemia: Continue Lipitor and check lipid panel  Seizure disorder: I think he actually is taking Depakote despite mom saying that he is not since he did have a 90-day prescription filled in April of asked them to get back to [place] to make sure they let us know whether he has missed Depakote or not  Seen counseling recommended repeat COVID-19 vaccine from this fall today since has been 4 months.  This was given  I have personally spent involved in face-to-face and non-face-to-face activities for this patient on the day of the visit. Professional time spent includes the following activities: Preparing to  see the patient (review of tests), Obtaining and/or reviewing separately obtained history (admission/discharge record), Performing a medically appropriate examination and/or evaluation , Ordering medications/tests/procedures, referring and communicating with other health care professionals, Documenting clinical information in the EMR, Independently interpreting results (not separately  reported), Communicating results to the patient/family/caregiver, Counseling and educating the patient/family/caregiver and Care coordination (not separately reported).

## 2023-03-22 LAB — T-HELPER CELLS (CD4) COUNT (NOT AT ARMC)
Absolute CD4: 276 cells/uL — ABNORMAL LOW (ref 490–1740)
CD4 T Helper %: 13 % — ABNORMAL LOW (ref 30–61)
Total lymphocyte count: 2112 cells/uL (ref 850–3900)

## 2023-03-23 LAB — T PALLIDUM AB: T Pallidum Abs: POSITIVE — AB

## 2023-03-23 LAB — COMPLETE METABOLIC PANEL WITH GFR
AG Ratio: 1.2 (calc) (ref 1.0–2.5)
ALT: 11 U/L (ref 9–46)
AST: 12 U/L (ref 10–40)
Albumin: 4.2 g/dL (ref 3.6–5.1)
Alkaline phosphatase (APISO): 54 U/L (ref 36–130)
BUN/Creatinine Ratio: 9 (calc) (ref 6–22)
BUN: 12 mg/dL (ref 7–25)
CO2: 25 mmol/L (ref 20–32)
Calcium: 9.5 mg/dL (ref 8.6–10.3)
Chloride: 104 mmol/L (ref 98–110)
Creat: 1.27 mg/dL — ABNORMAL HIGH (ref 0.60–1.26)
Globulin: 3.4 g/dL (calc) (ref 1.9–3.7)
Glucose, Bld: 109 mg/dL — ABNORMAL HIGH (ref 65–99)
Potassium: 4.6 mmol/L (ref 3.5–5.3)
Sodium: 138 mmol/L (ref 135–146)
Total Bilirubin: 0.3 mg/dL (ref 0.2–1.2)
Total Protein: 7.6 g/dL (ref 6.1–8.1)
eGFR: 74 mL/min/{1.73_m2} (ref >=60)

## 2023-03-23 LAB — CBC WITH DIFFERENTIAL/PLATELET
Absolute Monocytes: 446 cells/uL (ref 200–950)
Basophils Absolute: 32 cells/uL (ref 0–200)
Basophils Relative: 0.7 %
Eosinophils Absolute: 248 cells/uL (ref 15–500)
Eosinophils Relative: 5.5 %
HCT: 41.4 % (ref 38.5–50.0)
Hemoglobin: 14.2 g/dL (ref 13.2–17.1)
Lymphs Abs: 2016 cells/uL (ref 850–3900)
MCH: 33.5 pg — ABNORMAL HIGH (ref 27.0–33.0)
MCHC: 34.3 g/dL (ref 32.0–36.0)
MCV: 97.6 fL (ref 80.0–100.0)
MPV: 9.6 fL (ref 7.5–12.5)
Monocytes Relative: 9.9 %
Neutro Abs: 1760 cells/uL (ref 1500–7800)
Neutrophils Relative %: 39.1 %
Platelets: 306 10*3/uL (ref 140–400)
RBC: 4.24 10*6/uL (ref 4.20–5.80)
RDW: 13.1 % (ref 11.0–15.0)
Total Lymphocyte: 44.8 %
WBC: 4.5 10*3/uL (ref 3.8–10.8)

## 2023-03-23 LAB — RPR TITER: RPR Titer: 1:16 {titer} — ABNORMAL HIGH

## 2023-03-23 LAB — HIV-1 RNA QUANT-NO REFLEX-BLD
HIV 1 RNA Quant: 80 Copies/mL — ABNORMAL HIGH
HIV-1 RNA Quant, Log: 1.91 Log cps/mL — ABNORMAL HIGH

## 2023-03-23 LAB — LIPID PANEL
Cholesterol: 168 mg/dL (ref <200)
HDL: 41 mg/dL (ref >=40)
LDL Cholesterol (Calc): 105 mg/dL (calc) — ABNORMAL HIGH
Non-HDL Cholesterol (Calc): 127 mg/dL (calc) (ref <130)
Total CHOL/HDL Ratio: 4.1 (calc) (ref <5.0)
Triglycerides: 127 mg/dL (ref <150)

## 2023-03-23 LAB — RPR: RPR Ser Ql: REACTIVE — AB

## 2023-03-24 ENCOUNTER — Telehealth: Payer: Self-pay

## 2023-03-24 NOTE — Telephone Encounter (Signed)
I relayed lab results to the patient's mother and she verbalized understanding. She would like to have the patient continue the Bactrim for now.  Kilee Hedding T Pricilla Loveless

## 2023-03-24 NOTE — Telephone Encounter (Signed)
-----   Message from Randall Hiss, MD sent at 03/23/2023  5:46 PM EDT ----- Regarding: FW: Arthur [name]'s CD4 is almost 300 which is great news. I would like his VL < 50 though so he should make sure not missing doses. If he and Mom want to stop the Bactrim if he would like. Alternatively he can stay on for 3 months. I would like to keep the [name] and [place] going together for now ----- Message ----- From: Interface, [facility] Lab Results In Sent: 03/21/2023   2:34 PM EDT To: Randall Hiss, MD

## 2023-04-10 ENCOUNTER — Other Ambulatory Visit: Payer: Self-pay | Admitting: Neurology

## 2023-05-08 ENCOUNTER — Telehealth: Payer: Self-pay | Admitting: Neurology

## 2023-05-08 MED ORDER — DIVALPROEX SODIUM 250 MG PO DR TAB: DELAYED_RELEASE_TABLET | ORAL | 0 refills | Status: DC

## 2023-05-08 NOTE — Telephone Encounter (Signed)
Please make sure he is on the waitlist. Last seen in May 2023, needs revisit. I sent the refill in. Thanks  Meds ordered this encounter  Medications   divalproex (DEPAKOTE) 250 MG DR tablet    Sig: TAKE 2 TABLETS BY MOUTH TWICE DAILY    Dispense:  360 tablet    Refill:  0    Pt needs to make and keep appointment for future refill request.

## 2023-05-08 NOTE — Telephone Encounter (Signed)
At 9:22 pt's mother left a vm. Phone rep called, assisted mother in making 1 yr f/u for pt along with wait list.  Mother is asking if the divalproex (DEPAKOTE) 250 MG DR tablet can be filled thru  [facility] [phone] @ [place] [address]

## 2023-07-12 ENCOUNTER — Other Ambulatory Visit: Payer: Self-pay

## 2023-07-12 MED ORDER — ATORVASTATIN CALCIUM 20 MG PO TABS: 20.0000 mg | ORAL_TABLET | Freq: Every day | ORAL | 11 refills | Status: DC

## 2023-07-13 ENCOUNTER — Telehealth: Payer: Self-pay

## 2023-07-13 NOTE — Telephone Encounter (Signed)
Patient mother Kathi Der called to inform Dr.Van Dam that the patient has been having headaches and sleeping a lot x 2 weeks. Denies any other sx's. [name] is concerned due to patient having neurosyphilis in the past. Advised I would send a message back to Dr.Van Dam.

## 2023-07-14 NOTE — Telephone Encounter (Signed)
Left voicemail asking [name] to return my call.   Maybell Misenheimer Lesli Albee, CMA

## 2023-07-17 NOTE — Telephone Encounter (Signed)
Patient's mother called office back. Understands Dr. Zenaida Niece dam does not believed headaches and increase sleeping is related to nuerosyphillis. Will need to setup appointment for labs.  Will call back to setup appt.  Confirms patient is taking medication. Juanita Laster, RMA

## 2023-07-19 ENCOUNTER — Ambulatory Visit: Payer: 59 | Admitting: Infectious Diseases

## 2023-07-19 ENCOUNTER — Other Ambulatory Visit: Payer: Self-pay

## 2023-07-19 NOTE — Progress Notes (Deleted)
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) 02/16/2021   Headache syndrome 02/04/2021   Insomnia 01/26/2021   Lung consolidation (HCC)    Ground glass opacity present on imaging of lung    AIDS (HCC)    AIDS dementia complex (HCC)    S/P percutaneous endoscopic gastrostomy (PEG) tube placement (HCC)    Arterial hypotension    Slow transit constipation    Labile blood glucose    Hypomagnesemia    Acute blood loss anemia    Leucopenia    Labile blood pressure    HIV disease (HCC)    Debility 09/07/2019   Aspiration into airway    Palliative care by specialist    DNR (do not resuscitate) discussion    Weakness generalized    Dysphagia    Protein-calorie malnutrition, severe 08/19/2019   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   Goals of care, counseling/discussion    Bacterial meningitis    Thrush of mouth and esophagus (HCC)    Palliative care encounter    HIV (human immunodeficiency virus infection) (HCC) 07/29/2019   Vision loss 07/29/2019   Abnormal brain MRI 07/29/2019   Acquired immunodeficiency syndrome (HCC) 07/28/2019   Bradycardia 07/28/2019   Hyponatremia 07/28/2019   Nausea and vomiting 03/25/2013   Muscle spasm 03/25/2013   Drug use 01/11/2012   Neurosyphilis 11/23/2011   COUGH 06/23/2009   CHEST PAIN, PLEURITIC 06/23/2009   FUNGAL DERMATITIS 05/28/2008   GERD 05/28/2008   DENTAL CARIES 02/13/2008   NICOTINE ADDICTION 11/14/2007   BACK STRAIN, LUMBAR 08/22/2007   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   ATORVASTATIN (LIPITOR) 20 MG TABLET    Take 1 tablet (20 mg total) by mouth daily.   BICTEGRAVIR-EMTRICITABINE-TENOFOVIR AF (BIKTARVY) 50-200-25 MG TABS TABLET    Take 1 tablet by mouth daily.   DIVALPROEX (DEPAKOTE) 250 MG DR TABLET    TAKE 2 TABLETS BY MOUTH TWICE DAILY   ETODOLAC (LODINE) 300 MG CAPSULE    Take 1 capsule (300 mg total) by mouth 2 (two) times daily.   FAMOTIDINE (PEPCID) 20 MG TABLET    Take 1 tablet (20 mg total) by mouth at bedtime.   FOSTEMSAVIR TROMETHAMINE (RUKOBIA) 600 MG TB12 ER TABLET    Take 1 tablet (600 mg total) by mouth 2 (two) times daily.   HYDROXYZINE (ATARAX) 25 MG TABLET    Take 1 tablet (25 mg total) by mouth at bedtime as needed (insomnia).   MENTHOL-METHYL SALICYLATE (MUSCLE RUB) 10-15 % CREA    Apply 1 application topically as needed for muscle pain.   ONDANSETRON (ZOFRAN) 4 MG TABLET    Take 1 tablet (4 mg total) by mouth every 6 (six) hours as needed for nausea.   SULFAMETHOXAZOLE-TRIMETHOPRIM (BACTRIM DS) 800-160 MG TABLET    Take 1 tablet by mouth 2 (two) times daily.   SUMATRIPTAN (IMITREX) 50 MG TABLET    Take 1 tablet (  50 mg total) by mouth every 2 (two) hours as needed for migraine. May repeat in 2 hours if headache persists or recurs.  Modified Medications   No medications on file  Discontinued Medications   No medications on file    Subjective: Last not by Dr [name] reviewed:   Arthur [name] is a 42 year-old African-American man living with HIV who  fell out of care and was not seen by me since 2015.  He previously failed Atripla with resistance and IK 103 when he is being seen by Dr. Philipp Deputy.  I changed him to a protease-based regimen with Prezista Norvir and Truvada but he had not been seen since 2015.  He was admitted to the hospital with profound encephalopathy.   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  He  was also broadly treated for other possible CNS pathology including CMV encephalitis with ganciclovir RI PE for possible tuberculosis with steroids.  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   He was ultimately discharged from the inpatient hospital service to inpatient rehab and completed therapy while there.   Was previously suffering from dysphagia and nutritional deficiency he was given a PEG tube through which she was getting his medications along with nutrition.   He is living at home with his mother and also his sister who is a CMA and have been overseeing his medications.   He has had dramatic improvement in his neurological status and maintained perfect virological control. We retreated him for Neurosyphilis when his titers went up. He had low hgb while on PCN but never verified this prior to completion.   His CD4 is slow to come up.   I added Rukobia in July 2021 in hopes to help increase his CD4 count but he did not tolerate this medication with nausea and vomiting.   He did remained suppressed.   I saw Tillmon in March 2022   He had  been having headaches in the left side of his head sometimes wake him up at night.  He rated them as being about a 7 out of 10 in severity and they are also of a stabbing type of nature.  They ddid  respond to Tylenol and typically resolve within 40 minutes.  .   The hydroxyzine seen does help him sleep   Referred him to neurology who have been working him up for headaches and also has cognitive impairments.  He is also seeing neuropsychology.   His viral load remained suppressed on labs we checked in May 2022 but CD4 still is in the 9s and was not coming up very much.   In the interim we had to change out his sulfadiazine for clindamycin.  Several visits ago I thought it would be good idea to simplify his toxoplasma secondary prophylaxis by placing him on 1 double  strength Bactrim twice daily and getting rid of the pyrimethamine clindamycin and leucovorin.  WI also decided to re challenge him  with [name] with antiemetic therapy may help him.   He has been able to keep the [name] down with premedication.  Bronsyn's viral load remains suppressed and CD4 count came up a bit.  He then began having more problems with balance and unable to walk without using his cane where he was able to do this before.  He also sometimes wokeup in the morning with speech that is more difficult to understand according to  mom accompanies him to visit today.  Obtained an MRI of the brain which did not show any new findings whatsoever.  He is plugged back in with neurology and is on Depakote for seizures   CD4 at last check was 166 range x 2.   Appears to have stopped Depakote which I thought he should be taking though I see dispense record for 90 days [facility] in April  10/23     Review of Systems: ROS  Past Medical History:  Diagnosis Date   Anemia    Anxiety    Ataxia 11/08/2021   Depression    GERD (gastroesophageal reflux disease)    Headache    Headache syndrome 02/04/2021   HIV infection (HCC)    HIV infection with neurological disease (HCC) 08/13/2021   Insomnia 01/26/2021   Seizure disorder (HCC) 01/17/2022   Substance abuse (HCC)    Vaccine counseling 08/13/2021   Past Surgical History:  Procedure Laterality Date   IR GASTROSTOMY TUBE MOD SED  09/02/2019   IR GASTROSTOMY TUBE REMOVAL  10/28/2019   TOOTH EXTRACTION      Social History   Tobacco Use   Smoking status: Every Day    Current packs/day: 0.40    Average packs/day: 0.4 packs/day for 17.0 years (6.8 ttl pk-yrs)    Types: Cigarettes    Passive exposure: Past   Smokeless tobacco: Never  Vaping Use   Vaping status: Never Used  Substance Use Topics   Alcohol use: Yes    Comment: occassionally   Drug use: Yes    Frequency: 7.0 times per week    Types: Marijuana    Comment:  Daily    Family History  Problem Relation Age of Onset   Diabetes Neg Hx    CAD Neg Hx    Hypertension Neg Hx     No Known Allergies  Health Maintenance  Topic Date Due   DTaP/Tdap/Td (1 - Tdap) Never done   INFLUENZA VACCINE  04/27/2023   COVID-19 Vaccine (7 - 2023-24 season) 05/28/2023   Hepatitis C Screening  Completed   HIV Screening  Completed   HPV VACCINES  Aged Out    Objective:  There were no vitals filed for this visit. There is no height or weight on file to calculate BMI.  Physical Exam Constitutional:      Appearance: Normal appearance.  HENT:     Head: Normocephalic and atraumatic.      Mouth: Mucous membranes are moist.  Eyes:    Conjunctiva/sclera: Conjunctivae normal.     Pupils: Pupils are equal, round, and reactive to light.   Cardiovascular:     Rate and Rhythm: Normal rate and regular rhythm.     Heart sounds: No murmur heard. No friction rub. No gallop.   Pulmonary:     Effort: Pulmonary effort is normal.     Breath sounds: Normal breath sounds.   Abdominal:     General: Non distended     Palpations: soft.   Musculoskeletal:        General: Normal range of motion.   Skin:    General: Skin is warm and dry.     Comments:  Neurological:     General: grossly non focal     Mental Status: awake, alert and oriented to person, place, and time.   Psychiatric:        Mood and Affect: Mood normal.   Lab Results Lab Results  Component Value Date   WBC 4.5  03/21/2023   HGB 14.2 03/21/2023   HCT 41.4 03/21/2023   MCV 97.6 03/21/2023   PLT 306 03/21/2023    Lab Results  Component Value Date   CREATININE 1.27 (H) 03/21/2023   BUN 12 03/21/2023   NA 138 03/21/2023   K 4.6 03/21/2023   CL 104 03/21/2023   CO2 25 03/21/2023    Lab Results  Component Value Date   ALT 11 03/21/2023   AST 12 03/21/2023   ALKPHOS 75 11/19/2019   BILITOT 0.3 03/21/2023    Lab Results  Component Value Date   CHOL 168 03/21/2023   HDL 41  03/21/2023   LDLCALC 105 (H) 03/21/2023   TRIG 127 03/21/2023   CHOLHDL 4.1 03/21/2023   Lab Results  Component Value Date   LABRPR REACTIVE (A) 03/21/2023   RPRTITER 1:16 (H) 03/21/2023   HIV 1 RNA Quant (Copies/mL)  Date Value  03/21/2023 80 (H)  11/21/2022 21 (H)  06/08/2022 29 (H)   CD4 T Cell Abs (/uL)  Date Value  11/21/2022 164 (L)  06/08/2022 166 (L)  05/04/2021 86 (L)     Problem List Items Addressed This Visit   None   I have personally spent at least 60 minutes involved in face-to-face and non-face-to-face activities for this patient on the day of the visit. Professional time spent includes the following activities: Preparing to see the patient (review of tests), Obtaining and/or reviewing separately obtained history (admission/discharge record), Performing a medically appropriate examination and/or evaluation , Ordering medications/tests/procedures, referring and communicating with other health care professionals, Documenting clinical information in the EMR, Independently interpreting results (not separately reported), Communicating results to the patient/family/caregiver, Counseling and educating the patient/family/caregiver and Care coordination (not separately reported).   Victoriano Lain, MD [facility] 07/19/2023, 10:12 AM

## 2023-07-20 ENCOUNTER — Ambulatory Visit (INDEPENDENT_AMBULATORY_CARE_PROVIDER_SITE_OTHER): Payer: 59 | Admitting: Infectious Disease

## 2023-07-20 ENCOUNTER — Other Ambulatory Visit (HOSPITAL_COMMUNITY)
Admission: RE | Admit: 2023-07-20 | Discharge: 2023-07-20 | Disposition: A | Discharge status: Home / Self Care | Payer: 59 | Source: Ambulatory Visit | Attending: Infectious Diseases | Admitting: Infectious Diseases

## 2023-07-20 ENCOUNTER — Encounter: Payer: Self-pay | Admitting: Infectious Disease

## 2023-07-20 ENCOUNTER — Other Ambulatory Visit: Payer: Self-pay

## 2023-07-20 VITALS — BP 138/90 | HR 90 | Temp 98.1°F | Ht 71.0 in | Wt 222.0 lb

## 2023-07-20 DIAGNOSIS — G4489 Other headache syndrome: Secondary | ICD-10-CM

## 2023-07-20 DIAGNOSIS — Z23 Encounter for immunization: Secondary | ICD-10-CM

## 2023-07-20 DIAGNOSIS — A523 Neurosyphilis, unspecified: Secondary | ICD-10-CM

## 2023-07-20 DIAGNOSIS — R400 Somnolence: Secondary | ICD-10-CM

## 2023-07-20 DIAGNOSIS — B20 Human immunodeficiency virus [HIV] disease: Secondary | ICD-10-CM | POA: Diagnosis not present

## 2023-07-20 DIAGNOSIS — B582 Toxoplasma meningoencephalitis: Secondary | ICD-10-CM | POA: Diagnosis not present

## 2023-07-20 DIAGNOSIS — Z7185 Encounter for immunization safety counseling: Secondary | ICD-10-CM | POA: Diagnosis not present

## 2023-07-20 HISTORY — DX: Somnolence: R40.0

## 2023-07-20 NOTE — Progress Notes (Signed)
Chief complaint: followup for HIV disease with headaches, and excessive sleeping   Patient ID: Arthur [name], male    DOB: 1980-11-09, 42 y.o.   MRN: [id number]  HPI   Arthur [name] is a 42year-old African-American man living with HIV who  fell out of care and was not seen by me since 2015.  He previously failed Atripla with resistance and IK 103 when he is being seen by Dr. Philipp Deputy.  I changed him to a protease-based regimen with Prezista Norvir and Truvada but he had not been seen since 2015.  He was admitted to the hospital with profound encephalopathy.  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  He was also broadly treated for other possible CNS pathology including CMV encephalitis with ganciclovir RI PE for possible tuberculosis with steroids.  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  Interim history:   "He was ultimately discharged from the inpatient hospital service to inpatient rehab and completed therapy while there.  Was previously suffering from dysphagia and nutritional deficiency he was given a PEG tube through which she was getting his medications along with nutrition.  He is living at home with his mother and also his sister who is a CMA and have been overseeing his medications.  He has had dramatic improvement in his neurological status and maintained perfect virological control. We retreated him for Neurosyphilis when his titers went up. He had low hgb while on PCN but never verified this prior to completion.  His CD4 is slow to come up.  I added Rukobia in July 2021 in hopes to help increase his CD4 count but he did not tolerate this medication with nausea and vomiting.  He did remained suppressed.  I saw Arthur [name] in March 2022  He had  been having headaches in the left side of his head sometimes wake him up at night.  He rated them as being about a 7 out of 10 in  severity and they are also of a stabbing type of nature.  They ddid  respond to Tylenol and typically resolve within 40 minutes.  .  The hydroxyzine seen does help him sleep  Referred him to neurology who have been working him up for headaches and also has cognitive impairments.  He is also seeing neuropsychology.  His viral load remained suppressed on labs we checked in May 2022 but CD4 still is in the 29s and was not coming up very much.   In the interim we had to change out his sulfadiazine for clindamycin.  Several visits ago I thought it would be good idea to simplify his toxoplasma secondary prophylaxis by placing him on 1 double strength Bactrim twice daily and getting rid of the pyrimethamine clindamycin and leucovorin.  [place] also decided to re challenge him  with [name] with antiemetic therapy may help him.  He has been able to keep the [name] down with premedication.  Arthur [name]'s viral load remains suppressed and CD4 count came up a bit.  He then began having more problems with balance and unable to walk without using his cane where he was able to do this before.  He also sometimes wokeup in the morning with speech that is more difficult to understand according to mom accompanies him to visit today.  Obtained an MRI of the brain which did not show any new findings whatsoever.  He is plugged back in with neurology and is  on Depakote for seizures  CD4 at last check was 166 range x 2.  Appears to have stopped Depakote which I thought he should be taking though I see dispense record for 90 days from [facility] in April"   Discussed the use of AI scribe software for clinical note transcription with the patient, who gave verbal consent to proceed.  History of Present Illness   The patient, with a history of multiple brain infections, presents with recurrent headaches and increased sleepiness. The headaches are severe, frequent, and can wake the patient from sleep. The patient's  family member notes that these symptoms are similar to those experienced last year when the patient had "a recurrence of a brain infection requiring at-home antibiotic treatment." The patient is currently on Biktarvy, Rukobia, Bactrim, Lipitor, and Depakote. The patient's family member confirms that the patient is adherent to the medication regimen.      Indeed we did treat Arthur [name] for neurosyphilis again with 2 weeks of penicillin when he had an RPR titer of 1-128 last September 2023  Get a MRI as soon as possible and refer him to neurology who is actually seen previously and have looked at my notes.  Toxoplasmosis recurring would seem not to be a possibility while on prophylactic Bactrim.  He could have a new CNS infection he could have immune reconstitution syndrome though it happening this late would be highly unusual   Past Medical History:  Diagnosis Date   Anemia    Anxiety    Ataxia 11/08/2021   Depression    GERD (gastroesophageal reflux disease)    Headache    Headache syndrome 02/04/2021   HIV infection (HCC)    HIV infection with neurological disease (HCC) 08/13/2021   Insomnia 01/26/2021   Seizure disorder (HCC) 01/17/2022   Sleepiness 07/20/2023   Substance abuse (HCC)    Vaccine counseling 08/13/2021    Past Surgical History:  Procedure Laterality Date   IR GASTROSTOMY TUBE MOD SED  09/02/2019   IR GASTROSTOMY TUBE REMOVAL  10/28/2019   TOOTH EXTRACTION      Family History  Problem Relation Age of Onset   Diabetes Neg Hx    CAD Neg Hx    Hypertension Neg Hx       Social History   Socioeconomic History   Marital status: Single    Spouse name: Not on file   Number of children: Not on file   Years of education: Not on file   Highest education level: Not on file  Occupational History   Not on file  Tobacco Use   Smoking status: Every Day    Current packs/day: 0.40    Average packs/day: 0.4 packs/day for 17.0 years (6.8 ttl pk-yrs)    Types:  Cigarettes    Passive exposure: Past   Smokeless tobacco: Never  Vaping Use   Vaping status: Never Used  Substance and Sexual Activity   Alcohol use: Not Currently    Comment: occassionally   Drug use: Yes    Frequency: 7.0 times per week    Types: Marijuana    Comment: Daily   Sexual activity: Not Currently    Partners: Male    Comment: declined condoms 09/15/20  Other Topics Concern   Not on file  Social History Narrative   Right Handed   Drinks 2-3 cups caffeine daily   Social Determinants of Health   Financial Resource Strain: Not on file  Food Insecurity: Not on file  Transportation Needs: Not on file  Physical Activity: Not on file  Stress: Not on file  Social Connections: Not on file    No Known Allergies   Current Outpatient Medications:    acetaminophen (TYLENOL) 500 MG tablet, Take 500 mg by mouth every 6 (six) hours as needed., Disp: , Rfl:    bictegravir-emtricitabine-tenofovir AF (BIKTARVY) 50-200-25 MG TABS tablet, Take 1 tablet by mouth daily., Disp: 30 tablet, Rfl: 11   divalproex (DEPAKOTE) 250 MG DR tablet, TAKE 2 TABLETS BY MOUTH TWICE DAILY, Disp: 360 tablet, Rfl: 0   fostemsavir tromethamine (RUKOBIA) 600 MG TB12 ER tablet, Take 1 tablet (600 mg total) by mouth 2 (two) times daily., Disp: 60 tablet, Rfl: 11   hydrOXYzine (ATARAX) 25 MG tablet, Take 1 tablet (25 mg total) by mouth at bedtime as needed (insomnia)., Disp: 30 tablet, Rfl: 11   SUMAtriptan (IMITREX) 50 MG tablet, Take 1 tablet (50 mg total) by mouth every 2 (two) hours as needed for migraine. May repeat in 2 hours if headache persists or recurs., Disp: 10 tablet, Rfl: 5   atorvastatin (LIPITOR) 20 MG tablet, Take 1 tablet (20 mg total) by mouth daily., Disp: 30 tablet, Rfl: 11   etodolac (LODINE) 300 MG capsule, Take 1 capsule (300 mg total) by mouth 2 (two) times daily. (Patient not taking: Reported on 03/21/2023), Disp: 20 capsule, Rfl: 0   famotidine (PEPCID) 20 MG tablet, Take 1 tablet  (20 mg total) by mouth at bedtime. (Patient not taking: Reported on 03/21/2023), Disp: 30 tablet, Rfl: 11   Menthol-Methyl Salicylate (MUSCLE RUB) 10-15 % CREA, Apply 1 application topically as needed for muscle pain. (Patient not taking: Reported on 07/20/2023), Disp:  , Rfl: 0   ondansetron (ZOFRAN) 4 MG tablet, Take 1 tablet (4 mg total) by mouth every 6 (six) hours as needed for nausea. (Patient not taking: Reported on 07/20/2023), Disp: 120 tablet, Rfl: 4   sulfamethoxazole-trimethoprim (BACTRIM DS) 800-160 MG tablet, Take 1 tablet by mouth 2 (two) times daily. (Patient not taking: Reported on 07/20/2023), Disp: 60 tablet, Rfl: 11   Review of Systems  Unable to perform ROS: Dementia       Objective:   Physical Exam Constitutional:      Appearance: He is well-developed.  HENT:     Head: Normocephalic and atraumatic.  Eyes:     Conjunctiva/sclera: Conjunctivae normal.  Cardiovascular:     Rate and Rhythm: Normal rate and regular rhythm.  Pulmonary:     Effort: Pulmonary effort is normal. No respiratory distress.     Breath sounds: No wheezing.  Abdominal:     General: There is no distension.     Palpations: Abdomen is soft.  Musculoskeletal:        General: No tenderness. Normal range of motion.     Cervical back: Normal range of motion and neck supple.  Skin:    General: Skin is warm and dry.     Coloration: Skin is not pale.     Findings: No erythema or rash.  Neurological:     Mental Status: He is alert and oriented to person, place, and time.     Coordination: Coordination is intact.  Psychiatric:        Mood and Affect: Mood normal.        Behavior: Behavior normal.        Thought Content: Thought content normal.        Judgment: Judgment normal.          Assessment & Plan:  Assessment and Plan    Recurrent CNS Infections Increased frequency of headaches and increased sleepiness, similar to presentation last year when CNS infection recurred. Currently on  Bactrim for toxoplasmosis prophylaxis. -Order MRI of the brain to assess for recurrence of infection. -referral to neurology for further evaluation and management of headaches. --repeat RPR titer, labs as below  Seizure Disorder On Divalproex (Depakote) for seizure control. -Continue Divalproex as prescribed. --followup with Neurology  HIV On Biktarvy and Rukobia for HIV management. checking HIV RNA, CD4, CMP, CBC w diff -Continue Biktarvy and Rukobia as prescribed.  Hyperlipidemia On Atorvastatin (Lipitor) for cholesterol management. -Continue Atorvastatin as prescribed.  General Health Maintenance -Administer flu vaccine today if available. -Administer COVID-19 vaccine when available. -Schedule follow-up appointment in one month.     I have personally spent 40 minutes involved in face-to-face and non-face-to-face activities for this patient on the day of the visit. Professional time spent includes the following activities: Preparing to see the patient (review of tests), Obtaining and/or reviewing separately obtained history (admission/discharge record), Performing a medically appropriate examination and/or evaluation , Ordering medications/tests/procedures, referring and communicating with other health care professionals, Documenting clinical information in the EMR, Independently interpreting results (not separately reported), Communicating results to the patient/family/caregiver, Counseling and educating the patient/family/caregiver and Care coordination (not separately reported).

## 2023-07-21 ENCOUNTER — Ambulatory Visit (HOSPITAL_COMMUNITY)
Admission: RE | Admit: 2023-07-21 | Discharge: 2023-07-21 | Disposition: A | Discharge status: Home / Self Care | Payer: 59 | Source: Ambulatory Visit | Attending: Infectious Disease | Admitting: Infectious Disease

## 2023-07-21 DIAGNOSIS — B582 Toxoplasma meningoencephalitis: Secondary | ICD-10-CM | POA: Insufficient documentation

## 2023-07-21 DIAGNOSIS — A523 Neurosyphilis, unspecified: Secondary | ICD-10-CM | POA: Insufficient documentation

## 2023-07-21 DIAGNOSIS — G4489 Other headache syndrome: Secondary | ICD-10-CM | POA: Insufficient documentation

## 2023-07-21 DIAGNOSIS — B20 Human immunodeficiency virus [HIV] disease: Secondary | ICD-10-CM | POA: Insufficient documentation

## 2023-07-21 LAB — T-HELPER CELLS (CD4) COUNT (NOT AT ARMC)
CD4 % Helper T Cell: 14 % — ABNORMAL LOW (ref 33–65)
CD4 T Cell Abs: 298 /uL — ABNORMAL LOW (ref 400–1790)

## 2023-07-21 MED ORDER — GADOBUTROL 1 MMOL/ML IV SOLN
10.0000 mL | Freq: Once | INTRAVENOUS | Status: AC | PRN
Start: 2023-07-21 — End: 2023-07-21
  Administered 2023-07-21: 10 mL via INTRAVENOUS

## 2023-07-24 ENCOUNTER — Telehealth: Payer: Self-pay

## 2023-07-24 LAB — URINE CYTOLOGY ANCILLARY ONLY
Chlamydia: NEGATIVE
Comment: NORMAL
Comment: NEGATIVE
Neisseria Gonorrhea: NEGATIVE

## 2023-07-24 NOTE — Telephone Encounter (Addendum)
-----   Message from Elizabeth sent at 07/24/2023  9:28 AM EDT ----- Regarding: syphilis titers are down nicely his CD4 has come up nicely to nearly 300, I do wonder a little about IRIS, hopefully the MRI will be helpful  ----- Message ----- From: Interface, [facility] Lab Results In Sent: 07/20/2023   3:37 PM EDT To: Arthur Hiss, MD  Also, per Dr. [name]:   MRI of brain looks great as well, Received: Today [name], [name], MD  P [facility] Triage Nurse Pool No reason for neuro symptoms found in labs or MRI brain. No evidence for neurosypilis or new OI in brain.  Active his CD4 count stays up we can probably get rid of the Bactrim in a few months he should definitely keep the appointment with neurology though

## 2023-07-24 NOTE — Telephone Encounter (Signed)
Spoke with patient's mother, [name] ([place]), and relayed per Dr. [name] that lab results and imaging do not show any concerns for neurosyphilis, opportunistic infection, or anything else that would be a reason for his neuro symptoms.   Relayed that his CD4 count is up to 300 which is good news. Encouraged her to keep his appointment with neurology. She spoke with them this morning and is expecting a call back to get him scheduled.   Sandie Ano, RN

## 2023-07-25 ENCOUNTER — Ambulatory Visit (INDEPENDENT_AMBULATORY_CARE_PROVIDER_SITE_OTHER): Payer: 59 | Admitting: Neurology

## 2023-07-25 ENCOUNTER — Encounter: Payer: Self-pay | Admitting: Neurology

## 2023-07-25 VITALS — BP 135/78 | HR 110 | Ht 71.0 in | Wt 222.8 lb

## 2023-07-25 DIAGNOSIS — F0280 Dementia in other diseases classified elsewhere without behavioral disturbance: Secondary | ICD-10-CM

## 2023-07-25 DIAGNOSIS — B20 Human immunodeficiency virus [HIV] disease: Secondary | ICD-10-CM | POA: Diagnosis not present

## 2023-07-25 DIAGNOSIS — G969 Disorder of central nervous system, unspecified: Secondary | ICD-10-CM

## 2023-07-25 DIAGNOSIS — G4489 Other headache syndrome: Secondary | ICD-10-CM | POA: Diagnosis not present

## 2023-07-25 LAB — LIPID PANEL
Cholesterol: 175 mg/dL (ref <200)
HDL: 51 mg/dL (ref >=40)
LDL Cholesterol (Calc): 97 mg/dL
Non-HDL Cholesterol (Calc): 124 mg/dL (ref <130)
Total CHOL/HDL Ratio: 3.4 (calc) (ref <5.0)
Triglycerides: 176 mg/dL — ABNORMAL HIGH (ref <150)

## 2023-07-25 LAB — CBC WITH DIFFERENTIAL/PLATELET
Absolute Lymphocytes: 2094 {cells}/uL (ref 850–3900)
Absolute Monocytes: 471 {cells}/uL (ref 200–950)
Basophils Absolute: 31 {cells}/uL (ref 0–200)
Basophils Relative: 0.7 %
Eosinophils Absolute: 229 {cells}/uL (ref 15–500)
Eosinophils Relative: 5.2 %
HCT: 44.4 % (ref 38.5–50.0)
Hemoglobin: 14.9 g/dL (ref 13.2–17.1)
MCH: 33.6 pg — ABNORMAL HIGH (ref 27.0–33.0)
MCHC: 33.6 g/dL (ref 32.0–36.0)
MCV: 100.2 fL — ABNORMAL HIGH (ref 80.0–100.0)
MPV: 9.9 fL (ref 7.5–12.5)
Monocytes Relative: 10.7 %
Neutro Abs: 1575 {cells}/uL (ref 1500–7800)
Neutrophils Relative %: 35.8 %
Platelets: 296 10*3/uL (ref 140–400)
RBC: 4.43 10*6/uL (ref 4.20–5.80)
RDW: 13.1 % (ref 11.0–15.0)
Total Lymphocyte: 47.6 %
WBC: 4.4 10*3/uL (ref 3.8–10.8)

## 2023-07-25 LAB — HIV-1 RNA QUANT-NO REFLEX-BLD
HIV 1 RNA Quant: 20 {copies}/mL — ABNORMAL HIGH
HIV-1 RNA Quant, Log: 1.31 {Log_copies}/mL — ABNORMAL HIGH

## 2023-07-25 LAB — COMPLETE METABOLIC PANEL WITH GFR
AG Ratio: 1.2 (calc) (ref 1.0–2.5)
ALT: 17 U/L (ref 9–46)
AST: 16 U/L (ref 10–40)
Albumin: 4.3 g/dL (ref 3.6–5.1)
Alkaline phosphatase (APISO): 62 U/L (ref 36–130)
BUN: 10 mg/dL (ref 7–25)
CO2: 23 mmol/L (ref 20–32)
Calcium: 9.3 mg/dL (ref 8.6–10.3)
Chloride: 104 mmol/L (ref 98–110)
Creat: 1.18 mg/dL (ref 0.60–1.29)
Globulin: 3.5 g/dL (ref 1.9–3.7)
Glucose, Bld: 157 mg/dL — ABNORMAL HIGH (ref 65–99)
Potassium: 4.7 mmol/L (ref 3.5–5.3)
Sodium: 137 mmol/L (ref 135–146)
Total Bilirubin: 0.3 mg/dL (ref 0.2–1.2)
Total Protein: 7.8 g/dL (ref 6.1–8.1)
eGFR: 80 mL/min/{1.73_m2} (ref >=60)

## 2023-07-25 LAB — T PALLIDUM AB: T Pallidum Abs: POSITIVE — AB

## 2023-07-25 LAB — RPR TITER: RPR Titer: 1:16 {titer} — ABNORMAL HIGH

## 2023-07-25 LAB — RPR: RPR Ser Ql: REACTIVE — AB

## 2023-07-25 MED ORDER — SUMATRIPTAN SUCCINATE 100 MG PO TABS: ORAL_TABLET | ORAL | 11 refills | Status: AC

## 2023-07-25 MED ORDER — DIVALPROEX SODIUM ER 500 MG PO TB24: 1000.0000 mg | ORAL_TABLET | Freq: Every evening | ORAL | 11 refills | Status: DC

## 2023-07-25 NOTE — Progress Notes (Signed)
ASSESSMENT AND PLAN  Arthur [name] is a 42 y.o. male   Worsening headache HIV, history of central nervous system toxoplasmosis, neurosyphilis, Encephalopathy  His headache has some Migraine Features  No nuchal rigidity, fever to suggest active infection, already on Bactrim for toxoplasmosis prevention  Lumbar puncture to rule out recurrent infection,  Repeat MRI of the brain with and without contrast July 21, 2023 showed no acute intracranial abnormality, multifocal cerebellar encephalomalacia gliosis, multiple subcortical hypodensity signal abnormality, age advanced volume loss  Mother complains difficulty compliant with medication, change Depakote ER 500 mg 2 tablets every night as headache prevention, higher dose of Imitrex 100 mg as needed  Return To Clinic With NP In 3 Months   DIAGNOSTIC DATA (LABS, IMAGING, TESTING) - I reviewed patient records, labs, notes, testing and imaging myself where available.   MEDICAL HISTORY:  Arthur [name] is a 42 year old male, accompanied by his sister, also connected with mother via phone, seen in request by his infectious specialist Dr. [name], Remi Haggard for evaluation of increased headache,  History is obtained from the patient and review of electronic medical records. I personally reviewed pertinent available imaging films in PACS.   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, complicated by  neurosyphilis, optic retinitis, neuritis, advanced HIV, severe dysphagia, esophageal candidiasis  He was treated with IV fluconazole, PEG tube placement for nutritional support, hospital course also showed progressive hyponatremia, due to SIADH was treated with hypertonic saline, IV Lasix, with gradually improving cephalopathy, confusion,  Ever since then, he has frequent headaches, lives at home with his mother, taking Depakote DR to 50 mg 2 tablets twice a day has a headache prevention, Imitrex 50 mg as needed  Over past 3 months, mother reported increased frequency of headache up to 4 times each week, and longer duration, less optimal response to Imitrex  There is no new focal signs, baseline gait abnormality, needing mother's help to keep up with his medication ambulate with a cane  He is a poor historian, described holoacranial headaches, but could not elaborate on more details,  Reviewed infectious disease Dr. Algis Liming evaluation on July 20, 2023, increased frequency of headache, and sleepiness, similar to prior presentation when CNS infection recurred, currently on Bactrim for toxoplasmosis prophylaxis,  MRI brain w/wo in Oct 2024,  1. No acute intracranial abnormality. 2. Unchanged multifocal cerebellar encephalomalacia and gliosis, and multifocal subcortical hyperintense T2-weighted signal, likely sequelae of remote infection. 3. Age advanced volume loss, unchanged  Lab in 2024, RPR 1:16, normal CMP, CBC, HIV-2 RNA 20,  CD4 298, 14%    PHYSICAL EXAM:   There were no vitals filed for this visit. Not recorded     There is no height or weight on file to calculate BMI.  PHYSICAL EXAMNIATION:  Gen: NAD, conversant, well nourised, well groomed                     Cardiovascular: Regular rate rhythm, no peripheral edema, warm, nontender. Eyes: Conjunctivae clear without exudates or hemorrhage Neck:  Supple, no carotid bruits. Pulmonary: Clear to auscultation bilaterally    NEUROLOGICAL EXAM:  MENTAL STATUS: Speech/cognition: Depressed looking middle-age male, awake, cooperative on examinations, rely on for only members to provide history  CRANIAL NERVES: CN II: Visual fields are full to confrontation. Pupils are round equal and briskly reactive to light. CN III, IV, VI: extraocular movement are normal. No ptosis. CN V: Facial sensation is intact to light touch CN VII: Face is symmetric with normal eye closure  CN VIII: Hearing is normal to causal conversation. CN IX, X: Phonation is normal. CN XI: Head turning and shoulder shrug are intact  MOTOR: Moving 4 extremities without difficulty  REFLEXES: Reflexes are 2+ and symmetric at the biceps, triceps, knees, and ankles.  SENSORY: Intact to light touch,  COORDINATION: There is no trunk or limb dysmetria noted.  GAIT/STANCE: Push-up, using cane, mildly unsteady  REVIEW OF SYSTEMS:  Full 14 system review of systems performed and notable only for as above All other review of systems were negative.   ALLERGIES: No Known Allergies  HOME MEDICATIONS: Current Outpatient Medications  Medication Sig Dispense Refill   acetaminophen (TYLENOL) 500 MG tablet Take 500 mg by mouth every 6 (six) hours as needed.     atorvastatin (LIPITOR) 20 MG tablet Take 1 tablet (20 mg total) by mouth daily. 30 tablet 11   bictegravir-emtricitabine-tenofovir AF (BIKTARVY) 50-200-25 MG TABS tablet Take 1 tablet by mouth daily. 30 tablet 11   divalproex (DEPAKOTE) 250 MG DR tablet TAKE 2 TABLETS BY MOUTH TWICE DAILY 360 tablet 0   etodolac (LODINE) 300 MG capsule Take 1 capsule (300 mg total) by mouth 2 (two) times daily. (Patient not taking: Reported on 03/21/2023) 20 capsule 0   famotidine (PEPCID) 20 MG tablet Take 1 tablet (20 mg total) by mouth at bedtime. (Patient not taking: Reported on 03/21/2023) 30 tablet 11   fostemsavir tromethamine (RUKOBIA) 600 MG TB12 ER tablet Take 1 tablet (600 mg total) by mouth 2 (two)  times daily. 60 tablet 11   hydrOXYzine (ATARAX) 25 MG tablet Take 1 tablet (25 mg total) by mouth at bedtime as needed (insomnia). 30 tablet 11   Menthol-Methyl Salicylate (MUSCLE RUB) 10-15 % CREA Apply 1 application topically as needed for muscle pain. (Patient not taking: Reported on 07/20/2023)  0   ondansetron (ZOFRAN) 4 MG tablet Take 1 tablet (4 mg total) by mouth every 6 (six) hours as needed for nausea. (Patient not taking: Reported on 07/20/2023) 120 tablet 4   sulfamethoxazole-trimethoprim (BACTRIM DS) 800-160 MG tablet Take 1 tablet by mouth 2 (two) times daily. (Patient not taking: Reported on 07/20/2023) 60 tablet 11   SUMAtriptan (IMITREX) 50 MG tablet Take 1 tablet (50 mg total) by mouth every 2 (two) hours as needed for migraine. May repeat in 2 hours if headache persists or recurs. 10 tablet 5   No current facility-administered medications for this visit.    PAST MEDICAL HISTORY: Past Medical History:  Diagnosis Date   Anemia    Anxiety    Ataxia 11/08/2021   Depression    GERD (gastroesophageal reflux disease)    Headache    Headache syndrome 02/04/2021   HIV infection (HCC)    HIV infection with neurological disease (HCC) 08/13/2021   Insomnia 01/26/2021   Seizure disorder (HCC) 01/17/2022   Sleepiness 07/20/2023   Substance abuse (HCC)    Vaccine counseling 08/13/2021    PAST SURGICAL HISTORY: Past Surgical History:  Procedure Laterality Date   IR GASTROSTOMY  TUBE MOD SED  09/02/2019   IR GASTROSTOMY TUBE REMOVAL  10/28/2019   TOOTH EXTRACTION      FAMILY HISTORY: Family History  Problem Relation Age of Onset   Diabetes Neg Hx    CAD Neg Hx    Hypertension Neg Hx     SOCIAL HISTORY: Social History   Socioeconomic History   Marital status: Single    Spouse name: Not on file   Number of children: Not on file   Years of education: Not on file   Highest education level: Not on file  Occupational History   Not on file  Tobacco Use   Smoking  status: Every Day    Current packs/day: 0.40    Average packs/day: 0.4 packs/day for 17.0 years (6.8 ttl pk-yrs)    Types: Cigarettes    Passive exposure: Past   Smokeless tobacco: Never  Vaping Use   Vaping status: Never Used  Substance and Sexual Activity   Alcohol use: Not Currently    Comment: occassionally   Drug use: Yes    Frequency: 7.0 times per week    Types: Marijuana    Comment: Daily   Sexual activity: Not Currently    Partners: Male    Comment: declined condoms 09/15/20  Other Topics Concern   Not on file  Social History Narrative   Right Handed   Drinks 2-3 cups caffeine daily   Social Determinants of Health   Financial Resource Strain: Not on file  Food Insecurity: Not on file  Transportation Needs: Not on file  Physical Activity: Not on file  Stress: Not on file  Social Connections: Not on file  Intimate Partner Violence: Not on file      Levert Feinstein, M.D. Ph.D.  [facility] [address] [place], [place] [address] Ph: [phone] Fax: [phone]  CC:  [name], [name], MD [address] [place],  [place] [address]  Claiborne Rigg, NP    Total time spent reviewing the chart, obtaining history, examined patient, ordering tests, documentation, consultations and family, care coordination was 55 minutes

## 2023-07-28 NOTE — Addendum Note (Signed)
Addended by: Levert Feinstein on: 07/28/2023 10:48 AM   Modules accepted: Orders

## 2023-08-21 NOTE — Progress Notes (Deleted)
Subjective:    Patient ID: Arthur [name], male    DOB: 1981/05/12, 42 y.o.   MRN: [id number]  HPI  year-old African-American man living with HIV who  fell out of care and was not seen by me since 2015.  He previously failed Atripla with resistance and IK 103 when he is being seen by Dr. Philipp Deputy.  I changed him to a protease-based regimen with Prezista Norvir and Truvada but he had not been seen since 2015.  He was admitted to the hospital with profound encephalopathy.   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  He was also broadly treated for other possible CNS pathology including CMV encephalitis with ganciclovir RI PE for possible tuberculosis with steroids.  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   Interim history:     "He was ultimately discharged from the inpatient hospital service to inpatient rehab and completed therapy while there.   Was previously suffering from dysphagia and nutritional deficiency he was given a PEG tube through which she was getting his medications along with nutrition.   He is living at home with his mother and also his sister who is a CMA and have been overseeing his medications.   He has had dramatic improvement in his neurological status and maintained perfect virological control. We retreated him for Neurosyphilis when his titers went up. He had low hgb while on PCN but never verified this prior to completion.   His CD4 is slow to come up.   I added Rukobia in July 2021 in hopes to help increase his CD4 count but he did not tolerate this medication with nausea and vomiting.   He did remained suppressed.   I saw Arthur [name] in March 2022   He had  been having headaches in the left side of his head sometimes wake him up at night.  He rated them as being about a 7 out of 10 in severity and they are also of a stabbing type of nature.  They ddid   respond to Tylenol and typically resolve within 40 minutes.  .   The hydroxyzine seen does help him sleep   Referred him to neurology who have been working him up for headaches and also has cognitive impairments.  He is also seeing neuropsychology.   His viral load remained suppressed on labs we checked in May 2022 but CD4 still is in the 65s and was not coming up very much.   In the interim we had to change out his sulfadiazine for clindamycin.  Several visits ago I thought it would be good idea to simplify his toxoplasma secondary prophylaxis by placing him on 1 double strength Bactrim twice daily and getting rid of the pyrimethamine clindamycin and leucovorin.  WI also decided to re challenge him  with [name] with antiemetic therapy may help him.   He has been able to keep the [name] down with premedication.  Arthur [name]'s viral load remains suppressed and CD4 count came up a bit.  He then began having more problems with balance and unable to walk without using his cane where he was able to do this before.  He also sometimes wokeup in the morning with speech that is more difficult to understand according to mom accompanies him to visit today.  Obtained an MRI of the brain which did not show any new findings whatsoever.  He is plugged back in with  neurology and is on Depakote for seizures   CD4 at last check was 166 range x 2.   Appears to have stopped Depakote which I thought he should be taking though I see dispense record for 90 days from [facility] in April"     Discussed the use of AI scribe software for clinical note transcription with the patient, who gave verbal consent to proceed.   History of Present Illness   The patient, with a history of multiple brain infections, presents with recurrent headaches and increased sleepiness. The headaches are severe, frequent, and can wake the patient from sleep. The patient's family member notes that these symptoms are similar to those  experienced last year when the patient had "a recurrence of a brain infection requiring at-home antibiotic treatment." The patient is currently on Biktarvy, Rukobia, Bactrim, Lipitor, and Depakote. The patient's family member confirms that the patient is adherent to the medication regimen.       Indeed we did treat Arthur [name] for neurosyphilis again with 2 weeks of penicillin when he had an RPR titer of 1-128 last September 2023    Past Medical History:  Diagnosis Date   Anemia    Anxiety    Ataxia 11/08/2021   Depression    GERD (gastroesophageal reflux disease)    Headache    Headache syndrome 02/04/2021   HIV infection (HCC)    HIV infection with neurological disease (HCC) 08/13/2021   Insomnia 01/26/2021   Seizure disorder (HCC) 01/17/2022   Sleepiness 07/20/2023   Substance abuse (HCC)    Vaccine counseling 08/13/2021    Past Surgical History:  Procedure Laterality Date   IR GASTROSTOMY TUBE MOD SED  09/02/2019   IR GASTROSTOMY TUBE REMOVAL  10/28/2019   TOOTH EXTRACTION      Family History  Problem Relation Age of Onset   Diabetes Neg Hx    CAD Neg Hx    Hypertension Neg Hx       Social History   Socioeconomic History   Marital status: Single    Spouse name: Not on file   Number of children: Not on file   Years of education: Not on file   Highest education level: Not on file  Occupational History   Not on file  Tobacco Use   Smoking status: Every Day    Current packs/day: 0.40    Average packs/day: 0.4 packs/day for 17.0 years (6.8 ttl pk-yrs)    Types: Cigarettes    Passive exposure: Past   Smokeless tobacco: Never  Vaping Use   Vaping status: Never Used  Substance and Sexual Activity   Alcohol use: Not Currently    Comment: occassionally   Drug use: Yes    Frequency: 7.0 times per week    Types: Marijuana    Comment: Daily   Sexual activity: Not Currently    Partners: Male    Comment: declined condoms 09/15/20  Other Topics Concern   Not on file   Social History Narrative   Right Handed   Drinks 2-3 cups caffeine daily   Social Determinants of Health   Financial Resource Strain: Not on file  Food Insecurity: Not on file  Transportation Needs: Not on file  Physical Activity: Not on file  Stress: Not on file  Social Connections: Not on file    No Known Allergies   Current Outpatient Medications:    acetaminophen (TYLENOL) 500 MG tablet, Take 500 mg by mouth every 6 (six) hours as needed., Disp: , Rfl:  atorvastatin (LIPITOR) 20 MG tablet, Take 1 tablet (20 mg total) by mouth daily., Disp: 30 tablet, Rfl: 11   bictegravir-emtricitabine-tenofovir AF (BIKTARVY) 50-200-25 MG TABS tablet, Take 1 tablet by mouth daily., Disp: 30 tablet, Rfl: 11   divalproex (DEPAKOTE ER) 500 MG 24 hr tablet, Take 2 tablets (1,000 mg total) by mouth at bedtime., Disp: 60 tablet, Rfl: 11   divalproex (DEPAKOTE) 250 MG DR tablet, TAKE 2 TABLETS BY MOUTH TWICE DAILY, Disp: 360 tablet, Rfl: 0   etodolac (LODINE) 300 MG capsule, Take 1 capsule (300 mg total) by mouth 2 (two) times daily., Disp: 20 capsule, Rfl: 0   famotidine (PEPCID) 20 MG tablet, Take 1 tablet (20 mg total) by mouth at bedtime., Disp: 30 tablet, Rfl: 11   fostemsavir tromethamine (RUKOBIA) 600 MG TB12 ER tablet, Take 1 tablet (600 mg total) by mouth 2 (two) times daily., Disp: 60 tablet, Rfl: 11   hydrOXYzine (ATARAX) 25 MG tablet, Take 1 tablet (25 mg total) by mouth at bedtime as needed (insomnia)., Disp: 30 tablet, Rfl: 11   Menthol-Methyl Salicylate (MUSCLE RUB) 10-15 % CREA, Apply 1 application topically as needed for muscle pain., Disp:  , Rfl: 0   ondansetron (ZOFRAN) 4 MG tablet, Take 1 tablet (4 mg total) by mouth every 6 (six) hours as needed for nausea., Disp: 120 tablet, Rfl: 4   sulfamethoxazole-trimethoprim (BACTRIM DS) 800-160 MG tablet, Take 1 tablet by mouth 2 (two) times daily., Disp: 60 tablet, Rfl: 11   SUMAtriptan (IMITREX) 100 MG tablet, May repeat in 2 hours if  headache persists or recurs., Disp: 10 tablet, Rfl: 11    Review of Systems     Objective:   Physical Exam        Assessment & Plan:

## 2023-08-22 ENCOUNTER — Ambulatory Visit: Payer: 59 | Admitting: Infectious Disease

## 2023-08-22 DIAGNOSIS — Z7185 Encounter for immunization safety counseling: Secondary | ICD-10-CM

## 2023-08-22 DIAGNOSIS — G40909 Epilepsy, unspecified, not intractable, without status epilepticus: Secondary | ICD-10-CM

## 2023-08-22 DIAGNOSIS — B20 Human immunodeficiency virus [HIV] disease: Secondary | ICD-10-CM

## 2023-08-22 DIAGNOSIS — J181 Lobar pneumonia, unspecified organism: Secondary | ICD-10-CM

## 2023-08-22 DIAGNOSIS — A630 Anogenital (venereal) warts: Secondary | ICD-10-CM

## 2023-08-22 DIAGNOSIS — R531 Weakness: Secondary | ICD-10-CM

## 2023-08-22 DIAGNOSIS — B582 Toxoplasma meningoencephalitis: Secondary | ICD-10-CM

## 2023-09-07 ENCOUNTER — Encounter: Payer: Self-pay | Admitting: Infectious Disease

## 2023-09-07 ENCOUNTER — Other Ambulatory Visit: Payer: Self-pay

## 2023-09-07 ENCOUNTER — Other Ambulatory Visit (HOSPITAL_COMMUNITY)
Admission: RE | Admit: 2023-09-07 | Discharge: 2023-09-07 | Disposition: A | Discharge status: Home / Self Care | Payer: 59 | Source: Ambulatory Visit | Attending: Infectious Disease | Admitting: Infectious Disease

## 2023-09-07 ENCOUNTER — Ambulatory Visit (INDEPENDENT_AMBULATORY_CARE_PROVIDER_SITE_OTHER): Payer: 59 | Admitting: Infectious Disease

## 2023-09-07 VITALS — BP 124/75 | HR 82 | Resp 16 | Ht 71.0 in | Wt 224.0 lb

## 2023-09-07 DIAGNOSIS — Z23 Encounter for immunization: Secondary | ICD-10-CM | POA: Diagnosis not present

## 2023-09-07 DIAGNOSIS — G4700 Insomnia, unspecified: Secondary | ICD-10-CM | POA: Diagnosis not present

## 2023-09-07 DIAGNOSIS — F0280 Dementia in other diseases classified elsewhere without behavioral disturbance: Secondary | ICD-10-CM | POA: Diagnosis not present

## 2023-09-07 DIAGNOSIS — R270 Ataxia, unspecified: Secondary | ICD-10-CM | POA: Insufficient documentation

## 2023-09-07 DIAGNOSIS — B20 Human immunodeficiency virus [HIV] disease: Secondary | ICD-10-CM

## 2023-09-07 DIAGNOSIS — A523 Neurosyphilis, unspecified: Secondary | ICD-10-CM

## 2023-09-07 DIAGNOSIS — Z7185 Encounter for immunization safety counseling: Secondary | ICD-10-CM

## 2023-09-07 DIAGNOSIS — G40909 Epilepsy, unspecified, not intractable, without status epilepticus: Secondary | ICD-10-CM

## 2023-09-07 DIAGNOSIS — B582 Toxoplasma meningoencephalitis: Secondary | ICD-10-CM

## 2023-09-07 NOTE — Progress Notes (Signed)
Subjective:  Chief complaint follow-up for HIV disease on medications  Patient ID: Arthur [name], male    DOB: 02/07/1981, 42 y.o.   MRN: [id number]  HPI  year-old African-American man living with HIV who  fell out of care and was not seen by me since 2015.  He previously failed Atripla with resistance and IK 103 when he is being seen by Arthur [name].  I changed him to a protease-based regimen with Prezista Norvir and Truvada but he had not been seen since 2015.  He was admitted to the hospital with profound encephalopathy.   He was found to have Arthur [name] and was treated with penicillin.  He was also broadly treated for other possible CNS pathology including Arthur [name] encephalitis with ganciclovir RI PE for possible tuberculosis with steroids.  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   Interim history:     "He was ultimately discharged from the inpatient hospital service to inpatient rehab and completed therapy while there.   Was previously suffering from dysphagia and nutritional deficiency he was given a PEG tube through which she was getting his medications along with nutrition.   He is living at home with his mother and also his sister who is a Arthur [name] and have been overseeing his medications.   He has had dramatic improvement in his neurological status and maintained perfect virological control. We retreated him for Arthur [name] when his titers went up. He had low hgb while on Arthur [name] but never verified this prior to completion.   His CD4 is slow to come up.   I added Arthur [name] in July 2021 in hopes to help increase his CD4 count but he did not tolerate this medication with nausea and vomiting.   He did remained suppressed.   I saw Arthur [name] in March 2022   He had  been having headaches in the left side of his head sometimes wake him up at night.  He rated them as being about a 7 out of 10 in severity and they  are also of a stabbing type of nature.  They ddid  respond to Tylenol and typically resolve within 40 minutes.  .   The hydroxyzine seen does help him sleep   Referred him to neurology who have been working him up for headaches and also has cognitive impairments.  He is also seeing neuropsychology.   His viral load remained suppressed on labs we checked in May 2022 but CD4 still is in the 55s and was not coming up very much.   In the interim we had to change out his sulfadiazine for clindamycin.  Several visits ago I thought it would be good idea to simplify his toxoplasma secondary prophylaxis by placing him on 1 double strength Arthur [name] twice daily and getting rid of the pyrimethamine clindamycin and leucovorin.  WI also decided to re challenge him  with Arthur [name] with antiemetic therapy may help him.   He has been able to keep the Arthur [name] down with premedication.  Arthur [name]'s viral load remains suppressed and CD4 count came up a bit.  He then began having more problems with balance and unable to walk without using his cane where he was able to do this before.  He also sometimes wokeup in the morning with speech that is more difficult to understand according to mom accompanies him to visit today.  Obtained an MRI of the brain which did not show any new findings whatsoever.  He is plugged back in with neurology and is on Arthur [name] for seizures   CD4 at last check was 166 range x 2.   Appears to have stopped Arthur [name] which I thought he should be taking though I see dispense record for 90 days from Arthur [name] in April"     At last visit there was concern re patient having ecurrent headaches and increased sleepiness. The headaches are severe, frequent, and can wake the patient from sleep. The patient's family member notes that these symptoms are similar to those experienced last year when the patient had "a recurrence of a brain infection requiring at-home antibiotic treatment." The patient is  currently on Arthur [name], Arthur [name], Arthur [name], Arthur [name], and Arthur [name]. The patient's family member confirms that the patient is adherent to the medication regimen.   We obtained an MRI of the brain in late 06/2023 which showed   IMPRESSION: 1. No acute intracranial abnormality. 2. Unchanged multifocal cerebellar encephalomalacia and gliosis, and multifocal subcortical hyperintense T2-weighted signal, likely sequelae of remote infection. 3. Age advanced volume loss, unchanged  He was seen by Neurology, Arthur [name]. She felt that his headaches did have some migrainous features.   She had contemplated LP to rule out recurrent infection but I thought this was not necessary given re-assuring MRI of the brain and fact he has been adherent to his medications    Discussed the use of AI scribe software for clinical note transcription with the patient, who gave verbal consent to proceed.  History of Present Illness   The patient, with a history of HIV and seizures, presents with history of recent worsening headaches. He was referred to neurology and saw Arthur [name], who suggested that the headaches may have some features of migraines. The patient had an MRI, which showed no new changes. He reports a change in his medication regimen, with an increase in the dosage of Arthur [name] to 200mg , taken at night. He also continues to take Arthur [name], 1000mg  at night. For his HIV, he is on Arthur [name], and he also takes Arthur [name]. His most recent viral load was undetectable, and his CD4 count had risen to 298. He also reports a history of toxoplasmosis.         Past Medical History:  Diagnosis Date   Anemia    Anxiety    Ataxia 11/08/2021   Depression    GERD (gastroesophageal reflux disease)    Headache    Headache syndrome 02/04/2021   HIV infection (HCC)    HIV infection with neurological disease (HCC) 08/13/2021   Insomnia 01/26/2021   Seizure disorder (HCC) 01/17/2022   Sleepiness 07/20/2023   Substance  abuse (HCC)    Vaccine counseling 08/13/2021    Past Surgical History:  Procedure Laterality Date   IR GASTROSTOMY TUBE MOD SED  09/02/2019   IR GASTROSTOMY TUBE REMOVAL  10/28/2019   TOOTH EXTRACTION      Family History  Problem Relation Age of Onset   Diabetes Neg Hx    CAD Neg Hx    Hypertension Neg Hx       Social History   Socioeconomic History   Marital status: Single    Spouse name: Not on file   Number of children: Not on file   Years of education: Not on file   Highest education level: Not on file  Occupational History   Not on file  Tobacco Use   Smoking status: Every Day    Current packs/day: 0.40    Average packs/day: 0.4  packs/day for 17.0 years (6.8 ttl pk-yrs)    Types: Cigarettes    Passive exposure: Past   Smokeless tobacco: Never  Vaping Use   Vaping status: Never Used  Substance and Sexual Activity   Alcohol use: Not Currently    Comment: occassionally   Drug use: Yes    Frequency: 7.0 times per week    Types: Marijuana    Comment: Daily   Sexual activity: Not Currently    Partners: Male    Comment: declined condoms 09/15/20  Other Topics Concern   Not on file  Social History Narrative   Right Handed   Drinks 2-3 cups caffeine daily   Social Drivers of Corporate investment banker Strain: Not on file  Food Insecurity: Not on file  Transportation Needs: Not on file  Physical Activity: Not on file  Stress: Not on file  Social Connections: Not on file    No Known Allergies   Current Outpatient Medications:    acetaminophen (TYLENOL) 500 MG tablet, Take 500 mg by mouth every 6 (six) hours as needed., Disp: , Rfl:    atorvastatin (Arthur [name]) 20 MG tablet, Take 1 tablet (20 mg total) by mouth daily., Disp: 30 tablet, Rfl: 11   bictegravir-emtricitabine-tenofovir AF (Arthur [name]) 50-200-25 MG TABS tablet, Take 1 tablet by mouth daily., Disp: 30 tablet, Rfl: 11   divalproex (Arthur [name] ER) 500 MG 24 hr tablet, Take 2 tablets (1,000 mg total) by  mouth at bedtime., Disp: 60 tablet, Rfl: 11   divalproex (Arthur [name]) 250 MG DR tablet, TAKE 2 TABLETS BY MOUTH TWICE DAILY, Disp: 360 tablet, Rfl: 0   etodolac (LODINE) 300 MG capsule, Take 1 capsule (300 mg total) by mouth 2 (two) times daily., Disp: 20 capsule, Rfl: 0   famotidine (PEPCID) 20 MG tablet, Take 1 tablet (20 mg total) by mouth at bedtime., Disp: 30 tablet, Rfl: 11   fostemsavir tromethamine (Arthur [name]) 600 MG TB12 ER tablet, Take 1 tablet (600 mg total) by mouth 2 (two) times daily., Disp: 60 tablet, Rfl: 11   hydrOXYzine (ATARAX) 25 MG tablet, Take 1 tablet (25 mg total) by mouth at bedtime as needed (insomnia)., Disp: 30 tablet, Rfl: 11   Menthol-Methyl Salicylate (MUSCLE RUB) 10-15 % CREA, Apply 1 application topically as needed for muscle pain., Disp:  , Rfl: 0   ondansetron (ZOFRAN) 4 MG tablet, Take 1 tablet (4 mg total) by mouth every 6 (six) hours as needed for nausea., Disp: 120 tablet, Rfl: 4   sulfamethoxazole-trimethoprim (Arthur [name] DS) 800-160 MG tablet, Take 1 tablet by mouth 2 (two) times daily., Disp: 60 tablet, Rfl: 11   SUMAtriptan (Arthur [name]) 100 MG tablet, May repeat in 2 hours if headache persists or recurs., Disp: 10 tablet, Rfl: 11    Review of Systems  Unable to perform ROS: Dementia       Objective:   Physical Exam Constitutional:      Appearance: He is well-developed.  HENT:     Head: Normocephalic and atraumatic.  Eyes:     Conjunctiva/sclera: Conjunctivae normal.  Cardiovascular:     Rate and Rhythm: Normal rate and regular rhythm.  Pulmonary:     Effort: Pulmonary effort is normal. No respiratory distress.     Breath sounds: No wheezing.  Abdominal:     General: There is no distension.     Palpations: Abdomen is soft.  Musculoskeletal:        General: No tenderness. Normal range of motion.     Cervical back: Normal  range of motion and neck supple.  Skin:    General: Skin is warm and dry.     Coloration: Skin is not pale.     Findings: No  erythema or rash.  Neurological:     Mental Status: He is alert and oriented to person, place, and time.  Psychiatric:        Mood and Affect: Mood normal.        Thought Content: Thought content normal.        Judgment: Judgment normal.           Assessment & Plan:   Assessment and Plan    Headaches Previously worsening headaches with some migraine features. Neurology consultation resulted in an increase in Arthur [name] to 200mg  at night. Continues on Arthur [name] 1000mg  at night. -Continue current regimen of Arthur [name] and Arthur [name].  HIV Viral load undetectable, CD4 count improved to 298. Currently on Arthur [name], Arthur [name], and Arthur [name]. -Continue Arthur [name] and Arthur [name]. -Check CD4 count today. If above 200, consider discontinuing Arthur [name].  Hyperlipidemia On Arthur [name]. -Continue Arthur [name].  Anal Cancer Screening History of anal intercourse, albeit 20 years ago. Discussed risk of HPV and anal cancer. -Perform anal Pap smear today.  Vaccinations COVID 19 vaccine  Follow-up in 4 months.     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

## 2023-09-08 LAB — T-HELPER CELLS (CD4) COUNT (NOT AT ARMC)
CD4 % Helper T Cell: 13 % — ABNORMAL LOW (ref 33–65)
CD4 T Cell Abs: 274 /uL — ABNORMAL LOW (ref 400–1790)

## 2023-09-08 LAB — URINE CYTOLOGY ANCILLARY ONLY
Chlamydia: NEGATIVE
Comment: NEGATIVE
Comment: NORMAL
Neisseria Gonorrhea: NEGATIVE

## 2023-09-10 LAB — HIV RNA, RTPCR W/R GT (RTI, PI,INT)
HIV 1 RNA Quant: NOT DETECTED {copies}/mL
HIV-1 RNA Quant, Log: NOT DETECTED {Log}

## 2023-09-10 LAB — RPR TITER: RPR Titer: 1:4 {titer} — ABNORMAL HIGH

## 2023-09-10 LAB — COMPLETE METABOLIC PANEL WITH GFR
AG Ratio: 1.2 (calc) (ref 1.0–2.5)
ALT: 15 U/L (ref 9–46)
AST: 15 U/L (ref 10–40)
Albumin: 4.2 g/dL (ref 3.6–5.1)
Alkaline phosphatase (APISO): 59 U/L (ref 36–130)
BUN: 12 mg/dL (ref 7–25)
CO2: 28 mmol/L (ref 20–32)
Calcium: 10.3 mg/dL (ref 8.6–10.3)
Chloride: 103 mmol/L (ref 98–110)
Creat: 1.17 mg/dL (ref 0.60–1.29)
Globulin: 3.4 g/dL (ref 1.9–3.7)
Glucose, Bld: 94 mg/dL (ref 65–99)
Potassium: 4.8 mmol/L (ref 3.5–5.3)
Sodium: 139 mmol/L (ref 135–146)
Total Bilirubin: 0.3 mg/dL (ref 0.2–1.2)
Total Protein: 7.6 g/dL (ref 6.1–8.1)
eGFR: 81 mL/min/{1.73_m2} (ref >=60)

## 2023-09-10 LAB — CBC WITH DIFFERENTIAL/PLATELET
Absolute Lymphocytes: 2147 {cells}/uL (ref 850–3900)
Absolute Monocytes: 627 {cells}/uL (ref 200–950)
Basophils Absolute: 20 {cells}/uL (ref 0–200)
Basophils Relative: 0.4 %
Eosinophils Absolute: 219 {cells}/uL (ref 15–500)
Eosinophils Relative: 4.3 %
HCT: 42.6 % (ref 38.5–50.0)
Hemoglobin: 14.4 g/dL (ref 13.2–17.1)
MCH: 33.4 pg — ABNORMAL HIGH (ref 27.0–33.0)
MCHC: 33.8 g/dL (ref 32.0–36.0)
MCV: 98.8 fL (ref 80.0–100.0)
MPV: 10 fL (ref 7.5–12.5)
Monocytes Relative: 12.3 %
Neutro Abs: 2086 {cells}/uL (ref 1500–7800)
Neutrophils Relative %: 40.9 %
Platelets: 320 10*3/uL (ref 140–400)
RBC: 4.31 10*6/uL (ref 4.20–5.80)
RDW: 13 % (ref 11.0–15.0)
Total Lymphocyte: 42.1 %
WBC: 5.1 10*3/uL (ref 3.8–10.8)

## 2023-09-10 LAB — LIPID PANEL
Cholesterol: 163 mg/dL (ref <200)
HDL: 44 mg/dL (ref >=40)
LDL Cholesterol (Calc): 95 mg/dL
Non-HDL Cholesterol (Calc): 119 mg/dL (ref <130)
Total CHOL/HDL Ratio: 3.7 (calc) (ref <5.0)
Triglycerides: 141 mg/dL (ref <150)

## 2023-09-10 LAB — RPR: RPR Ser Ql: REACTIVE — AB

## 2023-09-10 LAB — T PALLIDUM AB: T Pallidum Abs: POSITIVE — AB

## 2023-09-11 ENCOUNTER — Telehealth: Payer: Self-pay

## 2023-09-11 NOTE — Telephone Encounter (Signed)
-----   Message from [place] sent at 09/08/2023 12:17 PM EST ----- Regarding: FW: He can stop his bactrim ----- Message ----- From: Janace Hoard Lab Results In Sent: 09/07/2023  10:53 PM EST To: Randall Hiss, MD

## 2023-09-11 NOTE — Telephone Encounter (Signed)
Informed patient mother - patient can stop bactrim.    Arrie Zuercher Lesli Albee, CMA

## 2023-09-12 LAB — CYTOLOGY - PAP
Comment: NEGATIVE
Diagnosis: UNDETERMINED — AB
High risk HPV: NEGATIVE

## 2023-09-13 ENCOUNTER — Other Ambulatory Visit: Payer: Self-pay | Admitting: Infectious Disease

## 2023-09-13 DIAGNOSIS — R87619 Unspecified abnormal cytological findings in specimens from cervix uteri: Secondary | ICD-10-CM

## 2023-09-14 ENCOUNTER — Telehealth: Payer: Self-pay

## 2023-09-14 NOTE — Telephone Encounter (Signed)
-----   Message from [place] sent at 09/13/2023  4:55 PM EST ----- Regarding: ASCUS on anal pap, referral to Gen surgery and Mom will need to be involved due to his cognitive deficits  ----- Message ----- From: Janace Hoard Lab Results In Sent: 09/07/2023  10:53 PM EST To: Randall Hiss, MD

## 2023-09-14 NOTE — Telephone Encounter (Signed)
Spoke with patient's mother regarding results. Understands that patient is  being referred to General Surgery for follow up. Will call office next Friday if they have not heard anything regarding referral. Juanita Laster, RMA

## 2023-10-24 ENCOUNTER — Telehealth: Payer: Self-pay

## 2023-10-24 NOTE — Telephone Encounter (Signed)
Spoke with patient's mom, [name] (DPR), to ensure that they were scheduled for consult with general surgery to further evaluate abnormal anal pap. She states [place] has an appointment with [facility] on 2/12.  Sandie Ano, RN

## 2023-11-08 ENCOUNTER — Ambulatory Visit: Payer: Self-pay | Admitting: General Surgery

## 2023-11-08 NOTE — H&P (Signed)
   REFERRING PHYSICIAN:  [name], [address]  PROVIDER:  Elenora Gamma, MD  MRN: [id number] DOB: [birth year] DATE OF ENCOUNTER: 11/08/2023  Subjective   Chief Complaint: New Consultation ( ANAL PAP,)     History of Present Illness: Arthur [name] is a 43 y.o. male who is seen today as an office consultation at the request of Dr. [name] for evaluation of New Consultation ( ANAL PAP,) .  43 year old male with HIV who presents to the office with ASCUS found on recent anal Pap in December 2024.   Review of Systems: A complete review of systems was obtained from the patient.  I have reviewed this information and discussed as appropriate with the patient.  See HPI as well for other ROS.   Medical History: Past Medical History:  Diagnosis Date   Anemia    Anxiety    GERD (gastroesophageal reflux disease)    HIV -AIDS with opportunistic infection, Symptomatic (CMS/HHS-HCC)    Seizures (CMS/HHS-HCC)     Patient Active Problem List  Diagnosis   HIV infection with neurological disease  (CMS/HHS-HCC)    History reviewed. No pertinent surgical history.   No Known Allergies  Current Outpatient Medications on File Prior to Visit  Medication Sig Dispense Refill   atorvastatin (LIPITOR) 20 MG tablet Take 20 mg by mouth once daily     BIKTARVY 50-200-25 mg tablet Take 1 tablet by mouth once daily     divalproex (DEPAKOTE ER) 500 MG ER tablet Take 1,000 mg by mouth     SUMAtriptan (IMITREX) 100 MG tablet TAKE 1 TABLET BY MOUTH AT ONSET OF HEADACHE. MAY REPEAT IN 2 HOURS IF HEADACHE PERSISTS OR RECURS     No current facility-administered medications on file prior to visit.    History reviewed. No pertinent family history.   Social History   Tobacco Use  Smoking Status Every Day   Current packs/day: 1.00   Types: Cigarettes  Smokeless Tobacco Never     Social History   Socioeconomic History   Marital status: Unknown  Tobacco Use   Smoking status: Every Day     Current packs/day: 1.00    Types: Cigarettes   Smokeless tobacco: Never  Substance and Sexual Activity   Alcohol use: Yes    Alcohol/week: 2.0 standard drinks of alcohol    Types: 2 Shots of liquor per week   Drug use: Yes    Types: Marijuana    Objective:    Vitals:   11/08/23 1534  BP: 119/84  Pulse: 85  Temp: 36.7 C (98 F)  Weight: 98.9 kg (218 lb)  Height: 180.3 cm (5\' 11" )     Exam Gen: NAD   Labs, Imaging and Diagnostic Testing: HIV: 20 CD4: 274  Assessment and Plan:  Diagnoses and all orders for this visit:  Pap smear of anus with ASCUS     43 year old HIV-positive male who presents to the office for evaluation of an abnormal anal Pap.  This shows ASCUS.  I have recommended high-resolution anoscopy with possible biopsy and ablation.  Risk of procedure include bleeding, pain and recurrence.  Arthur Panda, MD Colon and Rectal Surgery [facility]

## 2023-11-08 NOTE — H&P (View-Only) (Signed)
   REFERRING PHYSICIAN:  [name], [address]  PROVIDER:  Elenora Gamma, MD  MRN: [id number] DOB: [birth year] DATE OF ENCOUNTER: 11/08/2023  Subjective   Chief Complaint: New Consultation ( ANAL PAP,)     History of Present Illness: Arthur [name] is a 43 y.o. male who is seen today as an office consultation at the request of Dr. [name] for evaluation of New Consultation ( ANAL PAP,) .  43 year old male with HIV who presents to the office with ASCUS found on recent anal Pap in December 2024.   Review of Systems: A complete review of systems was obtained from the patient.  I have reviewed this information and discussed as appropriate with the patient.  See HPI as well for other ROS.   Medical History: Past Medical History:  Diagnosis Date   Anemia    Anxiety    GERD (gastroesophageal reflux disease)    HIV -AIDS with opportunistic infection, Symptomatic (CMS/HHS-HCC)    Seizures (CMS/HHS-HCC)     Patient Active Problem List  Diagnosis   HIV infection with neurological disease  (CMS/HHS-HCC)    History reviewed. No pertinent surgical history.   No Known Allergies  Current Outpatient Medications on File Prior to Visit  Medication Sig Dispense Refill   atorvastatin (LIPITOR) 20 MG tablet Take 20 mg by mouth once daily     BIKTARVY 50-200-25 mg tablet Take 1 tablet by mouth once daily     divalproex (DEPAKOTE ER) 500 MG ER tablet Take 1,000 mg by mouth     SUMAtriptan (IMITREX) 100 MG tablet TAKE 1 TABLET BY MOUTH AT ONSET OF HEADACHE. MAY REPEAT IN 2 HOURS IF HEADACHE PERSISTS OR RECURS     No current facility-administered medications on file prior to visit.    History reviewed. No pertinent family history.   Social History   Tobacco Use  Smoking Status Every Day   Current packs/day: 1.00   Types: Cigarettes  Smokeless Tobacco Never     Social History   Socioeconomic History   Marital status: Unknown  Tobacco Use   Smoking status: Every Day     Current packs/day: 1.00    Types: Cigarettes   Smokeless tobacco: Never  Substance and Sexual Activity   Alcohol use: Yes    Alcohol/week: 2.0 standard drinks of alcohol    Types: 2 Shots of liquor per week   Drug use: Yes    Types: Marijuana    Objective:    Vitals:   11/08/23 1534  BP: 119/84  Pulse: 85  Temp: 36.7 C (98 F)  Weight: 98.9 kg (218 lb)  Height: 180.3 cm (5\' 11" )     Exam Gen: NAD   Labs, Imaging and Diagnostic Testing: HIV: 20 CD4: 274  Assessment and Plan:  Diagnoses and all orders for this visit:  Pap smear of anus with ASCUS     43 year old HIV-positive male who presents to the office for evaluation of an abnormal anal Pap.  This shows ASCUS.  I have recommended high-resolution anoscopy with possible biopsy and ablation.  Risk of procedure include bleeding, pain and recurrence.  Vanita Panda, MD Colon and Rectal Surgery [facility]

## 2023-11-14 ENCOUNTER — Ambulatory Visit: Payer: 59 | Admitting: Neurology

## 2023-11-21 ENCOUNTER — Other Ambulatory Visit: Payer: Self-pay

## 2023-11-21 ENCOUNTER — Encounter (HOSPITAL_BASED_OUTPATIENT_CLINIC_OR_DEPARTMENT_OTHER): Payer: Self-pay | Admitting: General Surgery

## 2023-11-21 NOTE — Progress Notes (Signed)
   11/21/23 1458  PAT Phone Screen  Is the patient taking a GLP-1 receptor agonist? No  Do You Have Diabetes? No  Do You Have Hypertension? No  Have You Ever Been to the ER for Asthma? No  Have You Taken Oral Steroids in the Past 3 Months? No  Do you Take Phenteramine or any Other Diet Drugs? No  Recent  Lab Work, EKG, CXR? No (09/07/23 Viral load undetectable.)  Do you have a history of heart problems? No  Any Recent Hospitalizations? No  Height 5\' 11"  (1.803 m)  Weight 102 kg  Pat Appointment Scheduled No  Reason for No Appointment Not Needed   Reviewed pt's complex pmh with Dr Hyacinth Meeker. No PAT appt needed. May proceed with surgery at [facility] as planned as long as pt is cooperative.   Pre op call completed with mother. She states that the patient has been taking his medications as prescribed. She states he is cooperative and will not have a problem having IV started in pre op. Denies any recent illnesses.

## 2023-11-29 ENCOUNTER — Ambulatory Visit (HOSPITAL_BASED_OUTPATIENT_CLINIC_OR_DEPARTMENT_OTHER)
Admission: RE | Admit: 2023-11-29 | Discharge: 2023-11-29 | Disposition: A | Discharge status: Home / Self Care | Payer: 59 | Attending: General Surgery | Admitting: General Surgery

## 2023-11-29 ENCOUNTER — Ambulatory Visit (HOSPITAL_BASED_OUTPATIENT_CLINIC_OR_DEPARTMENT_OTHER): Admitting: Anesthesiology

## 2023-11-29 ENCOUNTER — Other Ambulatory Visit: Payer: Self-pay

## 2023-11-29 ENCOUNTER — Encounter (HOSPITAL_BASED_OUTPATIENT_CLINIC_OR_DEPARTMENT_OTHER): Payer: Self-pay | Admitting: General Surgery

## 2023-11-29 ENCOUNTER — Encounter (HOSPITAL_BASED_OUTPATIENT_CLINIC_OR_DEPARTMENT_OTHER)
Admission: RE | Disposition: A | Discharge status: Home / Self Care | Payer: Self-pay | Source: Home / Self Care | Attending: General Surgery

## 2023-11-29 DIAGNOSIS — Z79899 Other long term (current) drug therapy: Secondary | ICD-10-CM | POA: Insufficient documentation

## 2023-11-29 DIAGNOSIS — F418 Other specified anxiety disorders: Secondary | ICD-10-CM

## 2023-11-29 DIAGNOSIS — Z21 Asymptomatic human immunodeficiency virus [HIV] infection status: Secondary | ICD-10-CM | POA: Diagnosis not present

## 2023-11-29 DIAGNOSIS — R85619 Unspecified abnormal cytological findings in specimens from anus: Secondary | ICD-10-CM | POA: Insufficient documentation

## 2023-11-29 DIAGNOSIS — C44520 Squamous cell carcinoma of anal skin: Secondary | ICD-10-CM | POA: Diagnosis not present

## 2023-11-29 DIAGNOSIS — K219 Gastro-esophageal reflux disease without esophagitis: Secondary | ICD-10-CM | POA: Diagnosis not present

## 2023-11-29 DIAGNOSIS — F17210 Nicotine dependence, cigarettes, uncomplicated: Secondary | ICD-10-CM | POA: Insufficient documentation

## 2023-11-29 HISTORY — PX: HIGH RESOLUTION ANOSCOPY: SHX6345

## 2023-11-29 HISTORY — PX: RECTAL BIOPSY: SHX2303

## 2023-11-29 SURGERY — ANOSCOPY, HIGH RESOLUTION
Anesthesia: General | Site: Rectum

## 2023-11-29 MED ORDER — FENTANYL CITRATE (PF) 100 MCG/2ML IJ SOLN
INTRAMUSCULAR | Status: AC
  Filled 2023-11-29: qty 2

## 2023-11-29 MED ORDER — DEXMEDETOMIDINE HCL IN NACL 80 MCG/20ML IV SOLN
INTRAVENOUS | Status: AC
  Filled 2023-11-29: qty 20

## 2023-11-29 MED ORDER — MIDAZOLAM HCL 5 MG/5ML IJ SOLN
INTRAMUSCULAR | Status: DC | PRN
  Administered 2023-11-29: 2 mg via INTRAVENOUS

## 2023-11-29 MED ORDER — ACETAMINOPHEN 500 MG PO TABS: 1000.0000 mg | ORAL_TABLET | Freq: Once | ORAL | Status: DC

## 2023-11-29 MED ORDER — MIDAZOLAM HCL 2 MG/2ML IJ SOLN
INTRAMUSCULAR | Status: AC
  Filled 2023-11-29: qty 2

## 2023-11-29 MED ORDER — STERILE WATER FOR IRRIGATION IR SOLN
Status: DC | PRN
  Administered 2023-11-29: 500 mL

## 2023-11-29 MED ORDER — PROPOFOL 500 MG/50ML IV EMUL
INTRAVENOUS | Status: DC | PRN
  Administered 2023-11-29: 50 ug/kg/min via INTRAVENOUS

## 2023-11-29 MED ORDER — SODIUM CHLORIDE 0.9 % IV SOLN: 250.0000 mL | INTRAVENOUS | Status: DC | PRN

## 2023-11-29 MED ORDER — OXYCODONE HCL 5 MG PO TABS: 5.0000 mg | ORAL_TABLET | ORAL | Status: DC | PRN

## 2023-11-29 MED ORDER — ACETIC ACID 5 % SOLN
Status: AC
  Filled 2023-11-29: qty 50

## 2023-11-29 MED ORDER — PROPOFOL 10 MG/ML IV BOLUS
INTRAVENOUS | Status: AC
  Filled 2023-11-29: qty 20

## 2023-11-29 MED ORDER — FENTANYL CITRATE (PF) 100 MCG/2ML IJ SOLN: 25.0000 ug | INTRAMUSCULAR | Status: DC | PRN

## 2023-11-29 MED ORDER — ACETAMINOPHEN 500 MG PO TABS
ORAL_TABLET | ORAL | Status: AC
  Filled 2023-11-29: qty 2

## 2023-11-29 MED ORDER — GLYCOPYRROLATE 0.2 MG/ML IJ SOLN
INTRAMUSCULAR | Status: DC | PRN
  Administered 2023-11-29: .2 mg via INTRAVENOUS

## 2023-11-29 MED ORDER — ACETAMINOPHEN 325 MG PO TABS: 650.0000 mg | ORAL_TABLET | ORAL | Status: DC | PRN

## 2023-11-29 MED ORDER — OXYCODONE HCL 5 MG PO TABS: 5.0000 mg | ORAL_TABLET | Freq: Once | ORAL | Status: DC | PRN

## 2023-11-29 MED ORDER — OXYCODONE HCL 5 MG/5ML PO SOLN: 5.0000 mg | Freq: Once | ORAL | Status: DC | PRN

## 2023-11-29 MED ORDER — LIDOCAINE 2% (20 MG/ML) 5 ML SYRINGE
INTRAMUSCULAR | Status: AC
  Filled 2023-11-29: qty 5

## 2023-11-29 MED ORDER — TRAMADOL HCL 50 MG PO TABS: 50.0000 mg | ORAL_TABLET | Freq: Four times a day (QID) | ORAL | 0 refills | Status: AC | PRN

## 2023-11-29 MED ORDER — PROPOFOL 10 MG/ML IV BOLUS
INTRAVENOUS | Status: DC | PRN
  Administered 2023-11-29: 30 mg via INTRAVENOUS

## 2023-11-29 MED ORDER — DEXMEDETOMIDINE HCL IN NACL 80 MCG/20ML IV SOLN
INTRAVENOUS | Status: DC | PRN
Start: 2023-11-29 — End: 2023-11-29
  Administered 2023-11-29: 4 ug via INTRAVENOUS

## 2023-11-29 MED ORDER — BUPIVACAINE-EPINEPHRINE (PF) 0.5% -1:200000 IJ SOLN
INTRAMUSCULAR | Status: AC
Start: 2023-11-29 — End: ?
  Filled 2023-11-29: qty 30

## 2023-11-29 MED ORDER — BUPIVACAINE-EPINEPHRINE 0.5% -1:200000 IJ SOLN
INTRAMUSCULAR | Status: DC | PRN
  Administered 2023-11-29: 10 mL

## 2023-11-29 MED ORDER — AMISULPRIDE (ANTIEMETIC) 5 MG/2ML IV SOLN: 10.0000 mg | Freq: Once | INTRAVENOUS | Status: DC | PRN

## 2023-11-29 MED ORDER — SODIUM CHLORIDE 0.9% FLUSH: 3.0000 mL | INTRAVENOUS | Status: DC | PRN

## 2023-11-29 MED ORDER — SODIUM CHLORIDE 0.9% FLUSH: 3.0000 mL | Freq: Two times a day (BID) | INTRAVENOUS | Status: DC

## 2023-11-29 MED ORDER — ACETIC ACID 5 % SOLN
Status: DC | PRN
  Administered 2023-11-29: 1 via TOPICAL

## 2023-11-29 MED ORDER — ACETAMINOPHEN 325 MG RE SUPP: 650.0000 mg | RECTAL | Status: DC | PRN

## 2023-11-29 MED ORDER — ONDANSETRON HCL 4 MG/2ML IJ SOLN
INTRAMUSCULAR | Status: DC | PRN
  Administered 2023-11-29: 4 mg via INTRAVENOUS

## 2023-11-29 MED ORDER — PROPOFOL 500 MG/50ML IV EMUL
INTRAVENOUS | Status: AC
  Filled 2023-11-29: qty 50

## 2023-11-29 MED ORDER — DEXAMETHASONE SODIUM PHOSPHATE 10 MG/ML IJ SOLN
INTRAMUSCULAR | Status: AC
  Filled 2023-11-29: qty 1

## 2023-11-29 MED ORDER — ONDANSETRON HCL 4 MG/2ML IJ SOLN
INTRAMUSCULAR | Status: AC
  Filled 2023-11-29: qty 2

## 2023-11-29 MED ORDER — LACTATED RINGERS IV SOLN: INTRAVENOUS | Status: DC

## 2023-11-29 SURGICAL SUPPLY — 28 items
BRIEF MESH DISP 2XL (UNDERPADS AND DIAPERS) ×1 IMPLANT
COVER BACK TABLE 60X90IN (DRAPES) ×1 IMPLANT
DRAPE HYSTEROSCOPY (MISCELLANEOUS) IMPLANT
DRAPE SHEET LG 3/4 BI-LAMINATE (DRAPES) IMPLANT
ELECT REM PT RETURN 9FT ADLT (ELECTROSURGICAL) ×1 IMPLANT
ELECTRODE REM PT RTRN 9FT ADLT (ELECTROSURGICAL) ×1 IMPLANT
GAUZE PAD ABD 8X10 STRL (GAUZE/BANDAGES/DRESSINGS) ×1 IMPLANT
GAUZE SPONGE 4X4 12PLY STRL (GAUZE/BANDAGES/DRESSINGS) ×1 IMPLANT
GLOVE BIO SURGEON STRL SZ 6.5 (GLOVE) ×1 IMPLANT
GLOVE INDICATOR 6.5 STRL GRN (GLOVE) ×1 IMPLANT
GLOVE SURG SS PI 7.0 STRL IVOR (GLOVE) IMPLANT
GOWN STRL REUS W/TWL LRG LVL3 (GOWN DISPOSABLE) IMPLANT
GOWN STRL REUS W/TWL XL LVL3 (GOWN DISPOSABLE) ×1 IMPLANT
KIT TURNOVER KIT B (KITS) ×1 IMPLANT
LEGGING LITHOTOMY PAIR STRL (DRAPES) IMPLANT
NDL HYPO 22X1.5 SAFETY MO (MISCELLANEOUS) ×1 IMPLANT
NEEDLE HYPO 22X1.5 SAFETY MO (MISCELLANEOUS) ×1 IMPLANT
PACK BASIN DAY SURGERY FS (CUSTOM PROCEDURE TRAY) ×1 IMPLANT
PENCIL SMOKE EVACUATOR (MISCELLANEOUS) ×1 IMPLANT
SLEEVE SCD COMPRESS KNEE MED (STOCKING) ×1 IMPLANT
SPIKE FLUID TRANSFER (MISCELLANEOUS) ×1 IMPLANT
SUT CHROMIC 3 0 SH 27 (SUTURE) IMPLANT
SYR BULB IRRIG 60ML STRL (SYRINGE) ×1 IMPLANT
SYR CONTROL 10ML LL (SYRINGE) ×1 IMPLANT
TOWEL GREEN STERILE FF (TOWEL DISPOSABLE) ×1 IMPLANT
TRAY DSU PREP LF (CUSTOM PROCEDURE TRAY) ×1 IMPLANT
TUBE CONNECTING 20X1/4 (TUBING) ×1 IMPLANT
YANKAUER SUCT BULB TIP NO VENT (SUCTIONS) ×1 IMPLANT

## 2023-11-29 NOTE — Interval H&P Note (Signed)
 History and Physical Interval Note:  11/29/2023 11:38 AM  Arthur [name]  has presented today for surgery, with the diagnosis of ATYPICAL SQUAMOUS CELLS OF UNDETERMINED SIGNIFICANCE.  The various methods of treatment have been discussed with the patient and family. After consideration of risks, benefits and other options for treatment, the patient has consented to  Procedure(s): ANOSCOPY, HIGH RESOLUTION (N/A) POSSIBLE BIOPSY AND ABLATION (N/A) as a surgical intervention.  The patient's history has been reviewed, patient examined, no change in status, stable for surgery.  I have reviewed the patient's chart and labs.  Questions were answered to the patient's satisfaction.     Vanita Panda, MD  Colorectal and General Surgery [facility]

## 2023-11-29 NOTE — Anesthesia Procedure Notes (Signed)
 Procedure Name: MAC Date/Time: 11/29/2023 1:04 PM  Performed by: Jessica Priest, CRNAPre-anesthesia Checklist: Timeout performed, Patient being monitored, Suction available, Emergency Drugs available and Patient identified Patient Re-evaluated:Patient Re-evaluated prior to induction Oxygen Delivery Method: Simple face mask Preoxygenation: Pre-oxygenation with 100% oxygen Induction Type: IV induction Placement Confirmation: positive ETCO2, CO2 detector and breath sounds checked- equal and bilateral Comments: Dental assessment poor teeth , missing large incisiors, poor dentition gum disease - bottom teeth loose at different levels of attachment , front bottom very loose Changed to LMA at 1315 for desaturation

## 2023-11-29 NOTE — Transfer of Care (Signed)
  Immediate Anesthesia Transfer of Care Note  Patient: Arthur [name]  Procedure(s) Performed: Procedure(s) (LRB): ANOSCOPY, HIGH RESOLUTION (N/A) BIOPSY AND ABLATION (N/A)  Patient Location: PACU  Anesthesia Type: GA  Level of Consciousness: awake, sedated, patient cooperative and responds to stimulation, c/o pain in back - comfort measures given w/ medication   Airway & Oxygen Therapy: Patient Spontanous Breathing and Patient connected to [place] oxygen  Post-op Assessment: Report given to PACU RN, Post -op Vital signs reviewed and stable and Patient moving all extremities  Post vital signs: Reviewed and stable  Complications: No apparent anesthesia complications

## 2023-11-29 NOTE — Anesthesia Procedure Notes (Signed)
 Procedure Name: LMA Insertion Date/Time: 11/29/2023 1:15 PM  Performed by: Jessica Priest, CRNAPre-anesthesia Checklist: Patient identified, Emergency Drugs available, Suction available, Patient being monitored and Timeout performed Patient Re-evaluated:Patient Re-evaluated prior to induction Oxygen Delivery Method: Circle system utilized Preoxygenation: Pre-oxygenation with 100% oxygen Induction Type: IV induction Ventilation: Mask ventilation without difficulty LMA: LMA inserted LMA Size: 5.0 Number of attempts: 1 Airway Equipment and Method: Bite block Placement Confirmation: positive ETCO2, breath sounds checked- equal and bilateral and CO2 detector Tube secured with: Tape Dental Injury: Teeth and Oropharynx as per pre-operative assessment

## 2023-11-29 NOTE — Discharge Instructions (Addendum)
   Beginning the day after surgery:  You may sit in a tub of warm water 2-3 times a day to relieve discomfort.  Eat a regular diet high in fiber.  Avoid foods that give you constipation or diarrhea.  Avoid foods that are difficult to digest, such as seeds, nuts, corn or popcorn.  Do not go any longer than 2 days without a bowel movement.  You may take a dose of Milk of Magnesia if you become constipated.    Drink 6-8 glasses of water daily.  Walking is encouraged.  Avoid strenuous activity and heavy lifting for one month after surgery.    Call the office if you have any questions or concerns.  Call immediately if you develop:   Excessive rectal bleeding (more than a cup or passing large clots)  Increased discomfort  Fever greater than 100 F  Difficulty urinating   Post Anesthesia Home Care Instructions  Activity: Get plenty of rest for the remainder of the day. A responsible individual must stay with you for 24 hours following the procedure.  For the next 24 hours, DO NOT: -Drive a car -Advertising copywriter -Drink alcoholic beverages -Take any medication unless instructed by your physician -Make any legal decisions or sign important papers.  Meals: Start with liquid foods such as gelatin or soup. Progress to regular foods as tolerated. Avoid greasy, spicy, heavy foods. If nausea and/or vomiting occur, drink only clear liquids until the nausea and/or vomiting subsides. Call your physician if vomiting continues.  Special Instructions/Symptoms: Your throat may feel dry or sore from the anesthesia or the breathing tube placed in your throat during surgery. If this causes discomfort, gargle with warm salt water. The discomfort should disappear within 24 hours.  If you had a scopolamine patch placed behind your ear for the management of post- operative nausea and/or vomiting:  1. The medication in the patch is effective for 72 hours, after which it should be removed.  Wrap patch in  a tissue and discard in the trash. Wash hands thoroughly with soap and water. 2. You may remove the patch earlier than 72 hours if you experience unpleasant side effects which may include dry mouth, dizziness or visual disturbances. 3. Avoid touching the patch. Wash your hands with soap and water after contact with the patch.

## 2023-11-29 NOTE — Anesthesia Postprocedure Evaluation (Signed)
 Anesthesia Post Note  Patient: Arthur [name]  Procedure(s) Performed: ANOSCOPY, HIGH RESOLUTION (Rectum) BIOPSY AND ABLATION (Rectum)     Patient location during evaluation: PACU Anesthesia Type: General Level of consciousness: awake and alert Pain management: pain level controlled Vital Signs Assessment: post-procedure vital signs reviewed and stable Respiratory status: spontaneous breathing, nonlabored ventilation, respiratory function stable and patient connected to nasal cannula oxygen Cardiovascular status: blood pressure returned to baseline and stable Postop Assessment: no apparent nausea or vomiting Anesthetic complications: no  No notable events documented.  Last Vitals:  Vitals:   11/29/23 1430 11/29/23 1446  BP: 110/73 103/80  Pulse: 92 91  Resp: 16 16  Temp:  (!) 36.3 C  SpO2: 96% 96%    Last Pain:  Vitals:   11/29/23 1446  TempSrc: Temporal  PainSc: 0-No pain                 Desaree Downen L Susen Haskew

## 2023-11-29 NOTE — Anesthesia Preprocedure Evaluation (Addendum)
 Anesthesia Evaluation  Patient identified by MRN, date of birth, ID band Patient awake    Reviewed: Allergy & Precautions, NPO status , Patient's Chart, lab work & pertinent test results  Airway Mallampati: III  TM Distance: >3 FB Neck ROM: Full    Dental  (+) Loose, Chipped, Dental Advisory Given, Missing, Poor Dentition,    Pulmonary Current Smoker and Patient abstained from smoking.   Pulmonary exam normal breath sounds clear to auscultation       Cardiovascular negative cardio ROS Normal cardiovascular exam Rhythm:Regular Rate:Normal     Neuro/Psych  Headaches, Seizures -, Well Controlled,  PSYCHIATRIC DISORDERS Anxiety Depression   Dementia    GI/Hepatic Neg liver ROS,GERD  ,,  Endo/Other  negative endocrine ROS    Renal/GU negative Renal ROS  negative genitourinary   Musculoskeletal negative musculoskeletal ROS (+)    Abdominal   Peds  Hematology  (+) HIV  Anesthesia Other Findings   Reproductive/Obstetrics                             Anesthesia Physical Anesthesia Plan  ASA: 3  Anesthesia Plan: MAC   Post-op Pain Management:    Induction: Intravenous  PONV Risk Score and Plan: 1 and Propofol infusion, Treatment may vary due to age or medical condition, Midazolam, Ondansetron and Dexamethasone  Airway Management Planned: Natural Airway  Additional Equipment:   Intra-op Plan:   Post-operative Plan:   Informed Consent: I have reviewed the patients History and Physical, chart, labs and discussed the procedure including the risks, benefits and alternatives for the proposed anesthesia with the patient or authorized representative who has indicated his/her understanding and acceptance.     Dental advisory given  Plan Discussed with: CRNA  Anesthesia Plan Comments:        Anesthesia Quick Evaluation

## 2023-11-29 NOTE — Op Note (Signed)
 11/29/2023  1:43 PM  PATIENT:  Arthur [name]  43 y.o. male  Patient Care Team: Claiborne Rigg, NP as PCP - General (Nurse Practitioner) [name], [name], MD as PCP - Infectious Diseases (Infectious Diseases)  PRE-OPERATIVE DIAGNOSIS:  ATYPICAL SQUAMOUS CELLS OF UNDETERMINED SIGNIFICANCE  POST-OPERATIVE DIAGNOSIS:  ATYPICAL SQUAMOUS CELLS OF UNDETERMINED SIGNIFICANCE  PROCEDURE:  ANOSCOPY, HIGH RESOLUTION BIOPSY AND ABLATION    Surgeon(s): Romie Levee, MD  ASSISTANT: none   ANESTHESIA:   local and MAC  EBL: 5ml  Total I/O In: -  Out: 5 [Blood:5]  SPECIMEN:  Source of Specimen:  R and L lateral anal canal  DISPOSITION OF SPECIMEN:  PATHOLOGY  COUNTS:  YES  PLAN OF CARE: Discharge to home after PACU  PATIENT DISPOSITION:  PACU - hemodynamically stable.  INDICATION: 43 y.o. M with ASCUS on anal Pap test  OR FINDINGS: 2 moderate-sized demarcated lesions in the left lateral and right lateral anal rectal junction.  DESCRIPTION: The patient was identified in the preoperative holding area and taken to the OR where they were laid supine on the operating room table in sodomy position.  MAC anesthesia was induced, but the patient began to desaturate.  This was switched to an LMA and general anesthesia.  The patient was then prepped and draped in the usual sterile fashion. A surgical timeout was performed indicating the correct patient, procedure, positioning and preoperative antibioitics. SCDs were noted to be in place and functioning prior to the operation.   After this was completed, a sponge was soaked in 5% acetic acid was placed over the perianal region. This was allowed to soak for 2 minutes. The sponge was removed and the perianal region was evaluated with a colposcope.  There were no external lesions noted.  The internal anal canal was evaluated via anoscopy with a Hill-Ferguson anoscope.  There were 2 demarcated lesions in the right and left lateral anal rectal  junction.  After this was completed, complete ablation was performed with electrocautery and all of the biopsy sites were closed using a 2-0 chromic suture.  Additional Marcaine was placed under the ablation sites for postoperative pain control.  The patient was then awakened from anesthesia and sent to the postanesthesia care unit in stable condition.  All counts were correct per operating room staff.   Vanita Panda, MD  Colorectal and General Surgery [facility]

## 2023-11-30 ENCOUNTER — Encounter (HOSPITAL_BASED_OUTPATIENT_CLINIC_OR_DEPARTMENT_OTHER): Payer: Self-pay | Admitting: General Surgery

## 2023-11-30 LAB — SURGICAL PATHOLOGY

## 2024-01-16 ENCOUNTER — Telehealth: Payer: Self-pay | Admitting: Neurology

## 2024-01-16 ENCOUNTER — Other Ambulatory Visit: Payer: Self-pay

## 2024-01-16 ENCOUNTER — Other Ambulatory Visit (INDEPENDENT_AMBULATORY_CARE_PROVIDER_SITE_OTHER): Payer: Self-pay

## 2024-01-16 ENCOUNTER — Ambulatory Visit: Admitting: Neurology

## 2024-01-16 DIAGNOSIS — B20 Human immunodeficiency virus [HIV] disease: Secondary | ICD-10-CM

## 2024-01-16 DIAGNOSIS — F0280 Dementia in other diseases classified elsewhere without behavioral disturbance: Secondary | ICD-10-CM

## 2024-01-16 DIAGNOSIS — Z0289 Encounter for other administrative examinations: Secondary | ICD-10-CM

## 2024-01-16 NOTE — Telephone Encounter (Signed)
  Orders Placed This Encounter  Procedures   DG FL GUIDED LUMBAR PUNCTURE   Valproic acid  level   CBC with Differential/Platelet   Comprehensive metabolic panel with GFR   EEG adult

## 2024-01-16 NOTE — Telephone Encounter (Signed)
  Pt's mother, Lanice Pita (on [place]), mini seizure last 2 or3 minutes. Falls over if sitting, if standing falls over. Started a month ago, had two in one week. Seem to be coming more often. Has an appt tomorrow with PCP. Would like a call from the nurse to discuss if can work him in or a earlier appt.

## 2024-01-16 NOTE — Telephone Encounter (Signed)
 Call to mother, she reports that pver the last month patient is having episodes that she isn't sure if they are seizures or not. She reports that he stares off and begins to shake. I advised I would speak with Dr Gracie Lav and follow up.   I spoke with Dr. Gracie Lav and she has ordered LP, EEG and labs.   Call back to mom and she is in agreement with D.r Gracie Lav orders and will still keep PCP appointment tomorrow.

## 2024-01-16 NOTE — Progress Notes (Unsigned)
 Subjective:  Chief complaint follow-up for HIV disease on medications  Patient ID: Arthur [name], male    DOB: 19-Jul-1981, 43 y.o.   MRN: [id number]  HPI  year-old African-American man living with HIV who  fell out of care and was not seen by me since 2015.  He previously failed Atripla with resistance and IK 103 when he is being seen by Dr. Ferrell Hu.  I changed him to a protease-based regimen with Prezista  Norvir  and Truvada but he had not been seen since 2015.  He was admitted to the hospital with profound encephalopathy.   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  He was also broadly treated for other possible CNS pathology including CMV encephalitis with ganciclovir  RI PE for possible tuberculosis with steroids.  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   Interim history:     "He was ultimately discharged from the inpatient hospital service to inpatient rehab and completed therapy while there.   Was previously suffering from dysphagia and nutritional deficiency he was given a PEG tube through which she was getting his medications along with nutrition.   He is living at home with his mother and also his sister who is a CMA and have been overseeing his medications.   He has had dramatic improvement in his neurological status and maintained perfect virological control. We retreated him for Neurosyphilis when his titers went up. He had low hgb while on PCN but never verified this prior to completion.   His CD4 is slow to come up.   I added Rukobia  in July 2021 in hopes to help increase his CD4 count but he did not tolerate this medication with nausea and vomiting.   He did remained suppressed.   I saw [name] in March 2022   He had  been having headaches in the left side of his head sometimes wake him up at night.  He rated them as being about a 7 out of 10 in severity and they  are also of a stabbing type of nature.  They ddid  respond to Tylenol  and typically resolve within 40 minutes.  .   The hydroxyzine  seen does help him sleep   Referred him to neurology who have been working him up for headaches and also has cognitive impairments.  He is also seeing neuropsychology.   His viral load remained suppressed on labs we checked in May 2022 but CD4 still is in the 20s and was not coming up very much.   In the interim we had to change out his sulfadiazine  for clindamycin .  Several visits ago I thought it would be good idea to simplify his toxoplasma secondary prophylaxis by placing him on 1 double strength Bactrim  twice daily and getting rid of the pyrimethamine  clindamycin  and leucovorin .  WI also decided to re challenge him  with Rukobia  with antiemetic therapy may help him.   He has been able to keep the Rukobia  down with premedication.  Mcdonald's viral load remains suppressed and CD4 count came up a bit.  He then began having more problems with balance and unable to walk without using his cane where he was able to do this before.  He also sometimes wokeup in the morning with speech that is more difficult to understand according to mom accompanies him to visit today.  Obtained an MRI of the brain which did not show any new findings whatsoever.  He is plugged back in with neurology and is on Depakote  for seizures   CD4 at last check was 166 range x 2.   Appears to have stopped Depakote  which I thought he should be taking though I see dispense record for 90 days from [facility] in April"     At last visit there was concern re patient having ecurrent headaches and increased sleepiness. The headaches are severe, frequent, and can wake the patient from sleep. The patient's family member notes that these symptoms are similar to those experienced last year when the patient had "a recurrence of a brain infection requiring at-home antibiotic treatment." The patient is  currently on Biktarvy , Rukobia , Bactrim , Lipitor, and Depakote . The patient's family member confirms that the patient is adherent to the medication regimen.   We obtained an MRI of the brain in late 06/2023 which showed   IMPRESSION: 1. No acute intracranial abnormality. 2. Unchanged multifocal cerebellar encephalomalacia and gliosis, and multifocal subcortical hyperintense T2-weighted signal, likely sequelae of remote infection. 3. Age advanced volume loss, unchanged  He was seen by Neurology, Dr. Gracie Lav. She felt that his headaches did have some migrainous features.   She had contemplated LP to rule out recurrent infection but I thought this was not necessary given re-assuring MRI of the brain and fact he has been adherent to his medications    Discussed the use of AI scribe software for clinical note transcription with the patient, who gave verbal consent to proceed.  History of Present Illness             Past Medical History:  Diagnosis Date  . Anemia   . Anxiety   . Ataxia 11/08/2021  . Depression   . GERD (gastroesophageal reflux disease)   . Headache   . Headache syndrome 02/04/2021  . HIV infection (HCC)   . HIV infection with neurological disease (HCC) 08/13/2021  . Insomnia 01/26/2021  . Seizure disorder (HCC) 01/17/2022  . Sleepiness 07/20/2023  . Substance abuse (HCC)   . Vaccine counseling 08/13/2021    Past Surgical History:  Procedure Laterality Date  . HIGH RESOLUTION ANOSCOPY N/A 11/29/2023   Procedure: ANOSCOPY, HIGH RESOLUTION;  Surgeon: Joyce Nixon, MD;  Location: [facility];  Service: General;  Laterality: N/A;  . IR GASTROSTOMY TUBE MOD SED  09/02/2019  . IR GASTROSTOMY TUBE REMOVAL  10/28/2019  . RECTAL BIOPSY N/A 11/29/2023   Procedure: BIOPSY AND ABLATION;  Surgeon: Joyce Nixon, MD;  Location: [facility];  Service: General;  Laterality: N/A;  . TOOTH EXTRACTION      Family History  Problem Relation Age of Onset   . Diabetes Neg Hx   . CAD Neg Hx   . Hypertension Neg Hx       Social History   Socioeconomic History  . Marital status: Single    Spouse name: Not on file  . Number of children: Not on file  . Years of education: Not on file  . Highest education level: Not on file  Occupational History  . Not on file  Tobacco Use  . Smoking status: Every Day    Current packs/day: 0.40    Average packs/day: 0.4 packs/day for 17.0 years (6.8 ttl pk-yrs)    Types: Cigarettes    Passive exposure: Past  . Smokeless tobacco: Never  Vaping Use  . Vaping status: Never Used  Substance and Sexual Activity  . Alcohol use: Not Currently    Comment: occassionally  . Drug  use: Yes    Frequency: 7.0 times per week    Types: Marijuana    Comment: last use 11/27/2023  . Sexual activity: Not Currently    Partners: Male    Comment: declined condoms 09/15/20  Other Topics Concern  . Not on file  Social History Narrative   Right Handed   Drinks 2-3 cups caffeine daily   Social Drivers of Corporate investment banker Strain: Not on file  Food Insecurity: Not on file  Transportation Needs: Not on file  Physical Activity: Not on file  Stress: Not on file  Social Connections: Not on file    No Known Allergies   Current Outpatient Medications:  .  acetaminophen  (TYLENOL ) 500 MG tablet, Take 500 mg by mouth every 6 (six) hours as needed., Disp: , Rfl:  .  atorvastatin  (LIPITOR) 20 MG tablet, Take 1 tablet (20 mg total) by mouth daily., Disp: 30 tablet, Rfl: 11 .  bictegravir-emtricitabine -tenofovir  AF (BIKTARVY ) 50-200-25 MG TABS tablet, Take 1 tablet by mouth daily., Disp: 30 tablet, Rfl: 11 .  divalproex  (DEPAKOTE  ER) 500 MG 24 hr tablet, Take 2 tablets (1,000 mg total) by mouth at bedtime., Disp: 60 tablet, Rfl: 11 .  divalproex  (DEPAKOTE ) 250 MG DR tablet, TAKE 2 TABLETS BY MOUTH TWICE DAILY, Disp: 360 tablet, Rfl: 0 .  etodolac  (LODINE ) 300 MG capsule, Take 1 capsule (300 mg total) by mouth 2  (two) times daily. (Patient not taking: Reported on 11/21/2023), Disp: 20 capsule, Rfl: 0 .  fostemsavir tromethamine  (RUKOBIA ) 600 MG TB12 ER tablet, Take 1 tablet (600 mg total) by mouth 2 (two) times daily. (Patient not taking: Reported on 11/21/2023), Disp: 60 tablet, Rfl: 11 .  hydrOXYzine  (ATARAX ) 25 MG tablet, Take 1 tablet (25 mg total) by mouth at bedtime as needed (insomnia)., Disp: 30 tablet, Rfl: 11 .  Menthol -Methyl Salicylate  (MUSCLE RUB) 10-15 % CREA, Apply 1 application topically as needed for muscle pain., Disp:  , Rfl: 0 .  ondansetron  (ZOFRAN ) 4 MG tablet, Take 1 tablet (4 mg total) by mouth every 6 (six) hours as needed for nausea., Disp: 120 tablet, Rfl: 4 .  sulfamethoxazole -trimethoprim  (BACTRIM  DS) 800-160 MG tablet, Take 1 tablet by mouth 2 (two) times daily., Disp: 60 tablet, Rfl: 11 .  SUMAtriptan  (IMITREX ) 100 MG tablet, May repeat in 2 hours if headache persists or recurs., Disp: 10 tablet, Rfl: 11 .  traMADol  (ULTRAM ) 50 MG tablet, Take 1-2 tablets (50-100 mg total) by mouth every 6 (six) hours as needed., Disp: 20 tablet, Rfl: 0    Review of Systems  Unable to perform ROS: Dementia       Objective:   Physical Exam Constitutional:      Appearance: He is well-developed.  HENT:     Head: Normocephalic and atraumatic.  Eyes:     Conjunctiva/sclera: Conjunctivae normal.  Cardiovascular:     Rate and Rhythm: Normal rate and regular rhythm.  Pulmonary:     Effort: Pulmonary effort is normal. No respiratory distress.     Breath sounds: No wheezing.  Abdominal:     General: There is no distension.     Palpations: Abdomen is soft.  Musculoskeletal:        General: No tenderness. Normal range of motion.     Cervical back: Normal range of motion and neck supple.  Skin:    General: Skin is warm and dry.     Coloration: Skin is not pale.     Findings: No erythema  or rash.  Neurological:     Mental Status: He is alert and oriented to person, place, and time.   Psychiatric:        Mood and Affect: Mood normal.        Thought Content: Thought content normal.        Judgment: Judgment normal.          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

## 2024-01-17 ENCOUNTER — Ambulatory Visit: Payer: 59 | Admitting: Infectious Disease

## 2024-01-17 ENCOUNTER — Telehealth: Payer: Self-pay | Admitting: Neurology

## 2024-01-17 ENCOUNTER — Other Ambulatory Visit: Payer: Self-pay

## 2024-01-17 ENCOUNTER — Encounter: Payer: Self-pay | Admitting: Infectious Disease

## 2024-01-17 VITALS — BP 126/86 | HR 81 | Temp 97.8°F | Ht 71.0 in | Wt 215.0 lb

## 2024-01-17 DIAGNOSIS — B20 Human immunodeficiency virus [HIV] disease: Secondary | ICD-10-CM | POA: Diagnosis not present

## 2024-01-17 DIAGNOSIS — Z23 Encounter for immunization: Secondary | ICD-10-CM

## 2024-01-17 DIAGNOSIS — B582 Toxoplasma meningoencephalitis: Secondary | ICD-10-CM

## 2024-01-17 DIAGNOSIS — G8929 Other chronic pain: Secondary | ICD-10-CM

## 2024-01-17 DIAGNOSIS — R519 Headache, unspecified: Secondary | ICD-10-CM

## 2024-01-17 DIAGNOSIS — G40909 Epilepsy, unspecified, not intractable, without status epilepticus: Secondary | ICD-10-CM | POA: Diagnosis not present

## 2024-01-17 LAB — CBC WITH DIFFERENTIAL/PLATELET
Basophils Absolute: 0 10*3/uL (ref 0.0–0.2)
Basos: 0 %
EOS (ABSOLUTE): 0.2 10*3/uL (ref 0.0–0.4)
Eos: 3 %
Hematocrit: 42.7 % (ref 37.5–51.0)
Hemoglobin: 14.5 g/dL (ref 13.0–17.7)
Immature Grans (Abs): 0 10*3/uL (ref 0.0–0.1)
Immature Granulocytes: 0 %
Lymphocytes Absolute: 2.2 10*3/uL (ref 0.7–3.1)
Lymphs: 45 %
MCH: 32.7 pg (ref 26.6–33.0)
MCHC: 34 g/dL (ref 31.5–35.7)
MCV: 96 fL (ref 79–97)
Monocytes Absolute: 0.5 10*3/uL (ref 0.1–0.9)
Monocytes: 9 %
Neutrophils Absolute: 2.2 10*3/uL (ref 1.4–7.0)
Neutrophils: 43 %
Platelets: 302 10*3/uL (ref 150–450)
RBC: 4.44 x10E6/uL (ref 4.14–5.80)
RDW: 13.1 % (ref 11.6–15.4)
WBC: 5.1 10*3/uL (ref 3.4–10.8)

## 2024-01-17 LAB — COMPREHENSIVE METABOLIC PANEL WITH GFR
ALT: 15 IU/L (ref 0–44)
AST: 18 IU/L (ref 0–40)
Albumin: 4.5 g/dL (ref 4.1–5.1)
Alkaline Phosphatase: 73 IU/L (ref 44–121)
BUN/Creatinine Ratio: 13 (ref 9–20)
BUN: 13 mg/dL (ref 6–24)
Bilirubin Total: 0.2 mg/dL (ref 0.0–1.2)
CO2: 24 mmol/L (ref 20–29)
Calcium: 9.7 mg/dL (ref 8.7–10.2)
Chloride: 103 mmol/L (ref 96–106)
Creatinine, Ser: 0.99 mg/dL (ref 0.76–1.27)
Globulin, Total: 3.7 g/dL (ref 1.5–4.5)
Glucose: 84 mg/dL (ref 70–99)
Potassium: 4.8 mmol/L (ref 3.5–5.2)
Sodium: 142 mmol/L (ref 134–144)
Total Protein: 8.2 g/dL (ref 6.0–8.5)
eGFR: 99 mL/min/{1.73_m2} (ref >59)

## 2024-01-17 LAB — VALPROIC ACID LEVEL: Valproic Acid Lvl: 36 ug/mL — ABNORMAL LOW (ref 50–100)

## 2024-01-17 MED ORDER — RUKOBIA 600 MG PO TB12: 600.0000 mg | ORAL_TABLET | Freq: Two times a day (BID) | ORAL | 11 refills | Status: DC

## 2024-01-17 MED ORDER — BIKTARVY 50-200-25 MG PO TABS
1.0000 | ORAL_TABLET | Freq: Every day | ORAL | 11 refills | Status: DC
Start: 2024-01-17 — End: 2024-04-16

## 2024-01-17 NOTE — Telephone Encounter (Signed)
 Please call patient, Depakote  level was 36, subtherapeutic level, if his headache and seizure is under reasonable control keep current medications  Rest of the laboratory evaluation was normal, CBC CMP

## 2024-01-18 ENCOUNTER — Ambulatory Visit (INDEPENDENT_AMBULATORY_CARE_PROVIDER_SITE_OTHER): Admitting: Neurology

## 2024-01-18 DIAGNOSIS — R4182 Altered mental status, unspecified: Secondary | ICD-10-CM

## 2024-01-18 DIAGNOSIS — B20 Human immunodeficiency virus [HIV] disease: Secondary | ICD-10-CM

## 2024-01-18 DIAGNOSIS — F0280 Dementia in other diseases classified elsewhere without behavioral disturbance: Secondary | ICD-10-CM

## 2024-01-18 MED ORDER — DIVALPROEX SODIUM ER 500 MG PO TB24: 1500.0000 mg | ORAL_TABLET | Freq: Every evening | ORAL | 5 refills | Status: DC

## 2024-01-18 NOTE — Telephone Encounter (Signed)
 Please let patient taking higher dose of Depakote  ER 500mg  3 tabs every night

## 2024-01-18 NOTE — Telephone Encounter (Signed)
 Call to patients mom, she confirms that patient is taking Depakote  500 Er, 2 tablets at bedtime for total of 1000 mg nightly and has been compliant. Also takes imitrex  as needed for headaches.

## 2024-01-18 NOTE — Discharge Instructions (Signed)
 Lumbar Puncture Discharge Instructions  Go home and rest quietly as needed. You may resume normal activities; however, do not exert yourself strongly or do any heavy lifting today and tomorrow.   DO NOT drive today.    You may resume your normal diet and medications unless otherwise indicated. Drink lots of extra fluids today and tomorrow.   The incidence of headache, nausea, or vomiting is about 5% (one in 20 patients).  If you develop a headache, lie flat for 24 hours and drink plenty of fluids until the headache goes away.  Caffeinated beverages may be helpful. If when you get up you still have a headache when standing, go back to bed and force fluids for another 24 hours.   If you develop severe nausea and vomiting or a headache that does not go away with the flat bedrest after 48 hours, please call [phone].   Call your physician for a follow-up appointment.  The results of your Lumbar Puncture will be sent directly to your physician and they will contact you.   If you have any questions or if complications develop after you arrive home, please call [phone].  Discharge instructions have been explained to the patient.  The patient, or the person responsible for the patient, fully understands these instructions.   Thank you for visiting our office today.

## 2024-01-18 NOTE — Addendum Note (Signed)
 Addended by: Genora Kidd on: 01/18/2024 11:11 AM   Modules accepted: Orders

## 2024-01-18 NOTE — Addendum Note (Signed)
 Addended by: Gisela Lea on: 01/18/2024 05:31 PM   Modules accepted: Orders

## 2024-01-18 NOTE — Telephone Encounter (Signed)
 Please verify how much depakote  he is taking, may increase to higher dose, sounds like he still has a lots of headaches and seizures

## 2024-01-18 NOTE — Telephone Encounter (Signed)
 Call to mom Ms. Sulema Endo, she verbalized understanding or the results. She states he is having headaches 3-4 times a day and having some starring seizures with some tremors. Last known were two episodes 4/20. Pt is having EEG in office today and having  LP Monday.

## 2024-01-19 ENCOUNTER — Telehealth: Payer: Self-pay

## 2024-01-19 MED ORDER — DIVALPROEX SODIUM ER 500 MG PO TB24: 1500.0000 mg | ORAL_TABLET | Freq: Every evening | ORAL | 5 refills | Status: DC

## 2024-01-19 NOTE — Telephone Encounter (Signed)
 I called mother of pt.  I relayed that Dr. Gracie Lav did increase his depakote  500mg  ER tabs to take 3 tabs po at bedtime. Will send to [facility] per her request and cancel the [facility].  She verbalized understanding and appreciation.   Sent new prescription to [facility], [facility]VM at [facility] and cancelled there.

## 2024-01-19 NOTE — Telephone Encounter (Signed)
-----   Message from Medina sent at 01/19/2024 11:36 AM EDT ----- Regarding: RE: I thought we had stopped it? If he has not he dEF should stop it ----- Message ----- From: Maebelle Schmid, CMA Sent: 01/19/2024   9:45 AM EDT To: Charolette Copier, MD Subject: RE:                                            Does he need to stop D/C bactirm? ----- Message ----- From: Ernie Heal, Jerelyn Money, MD Sent: 01/19/2024   8:54 AM EDT To: [facility] Triage Nurse Pool Subject: FW:                                            Sheria Dills CD4 is over 500! ----- Message ----- From: Addison Holster Lab Results In Sent: 01/17/2024  11:03 PM EDT To: Charolette Copier, MD

## 2024-01-19 NOTE — Telephone Encounter (Signed)
 Informed patient mother Lanice Pita of results.   Ninette Basque stated that patient has stopped taking Bactrim .    Avi Archuleta Roann Chestnut, CMA

## 2024-01-19 NOTE — Addendum Note (Signed)
 Addended by: Viktoria Gray on: 01/19/2024 12:30 PM   Modules accepted: Orders

## 2024-01-19 NOTE — Addendum Note (Signed)
 Addended by: Lexus Shampine on: 01/19/2024 11:06 AM   Modules accepted: Orders

## 2024-01-20 LAB — HIV-1 RNA QUANT-NO REFLEX-BLD
HIV 1 RNA Quant: <20 {copies}/mL — AB
HIV-1 RNA Quant, Log: <1.3 {Log_copies}/mL — AB

## 2024-01-20 LAB — CBC WITH DIFFERENTIAL/PLATELET
Absolute Lymphocytes: 2683 {cells}/uL (ref 850–3900)
Absolute Monocytes: 432 {cells}/uL (ref 200–950)
Basophils Absolute: 21 {cells}/uL (ref 0–200)
Basophils Relative: 0.4 %
Eosinophils Absolute: 161 {cells}/uL (ref 15–500)
Eosinophils Relative: 3.1 %
HCT: 42.4 % (ref 38.5–50.0)
Hemoglobin: 14.4 g/dL (ref 13.2–17.1)
MCH: 32.8 pg (ref 27.0–33.0)
MCHC: 34 g/dL (ref 32.0–36.0)
MCV: 96.6 fL (ref 80.0–100.0)
MPV: 9.9 fL (ref 7.5–12.5)
Monocytes Relative: 8.3 %
Neutro Abs: 1903 {cells}/uL (ref 1500–7800)
Neutrophils Relative %: 36.6 %
Platelets: 320 10*3/uL (ref 140–400)
RBC: 4.39 10*6/uL (ref 4.20–5.80)
RDW: 13.4 % (ref 11.0–15.0)
Total Lymphocyte: 51.6 %
WBC: 5.2 10*3/uL (ref 3.8–10.8)

## 2024-01-20 LAB — T-HELPER CELLS (CD4) COUNT (NOT AT ARMC)
Absolute CD4: 510 {cells}/uL (ref 490–1740)
CD4 T Helper %: 17 % — ABNORMAL LOW (ref 30–61)
Total lymphocyte count: 3026 {cells}/uL (ref 850–3900)

## 2024-01-20 LAB — COMPLETE METABOLIC PANEL WITHOUT GFR
AG Ratio: 1.2 (calc) (ref 1.0–2.5)
ALT: 14 U/L (ref 9–46)
AST: 16 U/L (ref 10–40)
Albumin: 4.2 g/dL (ref 3.6–5.1)
Alkaline phosphatase (APISO): 61 U/L (ref 36–130)
BUN: 12 mg/dL (ref 7–25)
CO2: 26 mmol/L (ref 20–32)
Calcium: 9.1 mg/dL (ref 8.6–10.3)
Chloride: 103 mmol/L (ref 98–110)
Creat: 1.01 mg/dL (ref 0.60–1.29)
Globulin: 3.5 g/dL (ref 1.9–3.7)
Glucose, Bld: 115 mg/dL — ABNORMAL HIGH (ref 65–99)
Potassium: 4.3 mmol/L (ref 3.5–5.3)
Sodium: 137 mmol/L (ref 135–146)
Total Bilirubin: 0.3 mg/dL (ref 0.2–1.2)
Total Protein: 7.7 g/dL (ref 6.1–8.1)

## 2024-01-20 LAB — T PALLIDUM AB: T Pallidum Abs: POSITIVE — AB

## 2024-01-20 LAB — RPR TITER: RPR Titer: 1:8 {titer} — ABNORMAL HIGH

## 2024-01-20 LAB — RPR: RPR Ser Ql: REACTIVE — AB

## 2024-01-22 ENCOUNTER — Inpatient Hospital Stay
Admission: RE | Admit: 2024-01-22 | Discharge: 2024-01-22 | Disposition: A | Discharge status: Home / Self Care | Source: Ambulatory Visit | Attending: Neurology | Admitting: Neurology

## 2024-01-22 ENCOUNTER — Ambulatory Visit
Admission: RE | Admit: 2024-01-22 | Discharge: 2024-01-22 | Disposition: A | Discharge status: Home / Self Care | Source: Ambulatory Visit | Attending: Neurology | Admitting: Neurology

## 2024-01-22 VITALS — BP 120/85 | HR 85

## 2024-01-22 DIAGNOSIS — F0280 Dementia in other diseases classified elsewhere without behavioral disturbance: Secondary | ICD-10-CM

## 2024-01-22 DIAGNOSIS — B20 Human immunodeficiency virus [HIV] disease: Secondary | ICD-10-CM

## 2024-01-22 NOTE — Discharge Instructions (Signed)
 Lumbar Puncture Discharge Instructions  Go home and rest quietly as needed. You may resume normal activities; however, do not exert yourself strongly or do any heavy lifting today and tomorrow.   DO NOT drive today.    You may resume your normal diet and medications unless otherwise indicated. Drink lots of extra fluids today and tomorrow.   The incidence of headache, nausea, or vomiting is about 5% (one in 20 patients).  If you develop a headache, lie flat for 24 hours and drink plenty of fluids until the headache goes away.  Caffeinated beverages may be helpful. If when you get up you still have a headache when standing, go back to bed and force fluids for another 24 hours.   If you develop severe nausea and vomiting or a headache that does not go away with the flat bedrest after 48 hours, please call [phone].   Call your physician for a follow-up appointment.  The results of your Lumbar Puncture will be sent directly to your physician and they will contact you.   If you have any questions or if complications develop after you arrive home, please call [phone].  Discharge instructions have been explained to the patient.  The patient, or the person responsible for the patient, fully understands these instructions.   Thank you for visiting our office today.

## 2024-01-25 LAB — TIQ-MISC

## 2024-01-25 LAB — TOXOPLASMA DNA, QUAL BY PCR: TOXOPLASMA GONDII DNA, QL REAL TIME PCR: NOT DETECTED

## 2024-01-29 NOTE — Procedures (Signed)
   HISTORY: 43 year old male with history of HIV, central nervous system toxoplasmosis, presenting with worsening confusion, headaches  TECHNIQUE:  This is a routine 16 channel EEG recording with one channel devoted to a limited EKG recording.  It was performed during wakefulness, drowsiness and asleep.  Hyperventilation and photic stimulation were performed as activating procedures.  There are minimum muscle and movement artifact noted.  Upon maximum arousal, posterior dominant waking rhythm consistent of rhythmic alpha range activity. Activities are symmetric over the bilateral posterior derivations and attenuated with eye opening.  Photic stimulation did not alter the tracing.  Hyperventilation produced mild/moderate buildup with higher amplitude and the slower activities noted.  During EEG recording, patient developed drowsiness and no deeper stage of sleep was achieved  During EEG recording, there was no epileptiform discharge noted.  EKG demonstrate normal sinus rhythm.  CONCLUSION: This is a  normal awake EEG.  There is no electrodiagnostic evidence of epileptiform discharge.  Derinda Bartus, M.D. Ph.D.  [facility] [address] [place], [place] [address] Phone: [phone] Fax:      [phone]

## 2024-02-12 NOTE — Progress Notes (Signed)
 The 10-year ASCVD risk score (Arnett DK, et al., 2019) is: 4.6%   Values used to calculate the score:     Age: 43 years     Sex: Male     Is Non-Hispanic African American: Yes     Diabetic: No     Tobacco smoker: Yes     Systolic Blood Pressure: 120 mmHg     Is BP treated: No     HDL Cholesterol: 44 mg/dL     Total Cholesterol: 163 mg/dL  Currently prescribed atorvastatin  20 mg.   Terricka Onofrio, BSN, RN

## 2024-02-22 ENCOUNTER — Telehealth: Payer: Self-pay | Admitting: Neurology

## 2024-02-22 LAB — FUNGUS CULTURE W SMEAR
CULTURE:: NO GROWTH
MICRO NUMBER:: 16388879
SMEAR:: NONE SEEN
SPECIMEN QUALITY:: ADEQUATE

## 2024-02-22 LAB — CSF CELL COUNT WITH DIFFERENTIAL
RBC Count, CSF: 0 {cells}/uL
TOTAL NUCLEATED CELL: 0 {cells}/uL (ref 0–5)

## 2024-02-22 LAB — GRAM STAIN
MICRO NUMBER:: 16388878
SPECIMEN QUALITY:: ADEQUATE

## 2024-02-22 LAB — PROTEIN, CSF: Total Protein, CSF: 99 mg/dL — ABNORMAL HIGH (ref 15–45)

## 2024-02-22 LAB — VDRL, CSF: VDRL Quant, CSF: 1:1 {titer} — AB

## 2024-02-22 LAB — GLUCOSE, CSF: Glucose, CSF: 72 mg/dL (ref 40–80)

## 2024-02-22 NOTE — Telephone Encounter (Signed)
 Please call patient,  Lumbar puncture on January 22, 2024 showed within normal range opening pressure  spinal fluid testing showed   --elevated total protein, 99, compared to previous significantly elevated level (of April/2021 143; 07/2019, more than 600), this has much improved,   ---in addition, normal glucose level, 0 WBC, steady decline of VDRL titer  Above findings indicating a residual CSF findings from previous central nervous system infection, rather than an active infectious process.  He is to continue follow-up with his infectious disease doctor,  Keep scheduled follow-up visit   From reviewing January 2021 Hospital discharge, patient had a history of CNS toxoplasmosis, complicated by CNS syphilis, optic retinitis/neuritis, advanced HIV

## 2024-02-22 NOTE — Telephone Encounter (Signed)
 Called and spoke to pt mother and relayed results. Pt mother voiced understanding.  Stated that the pt is much improved.

## 2024-03-13 ENCOUNTER — Ambulatory Visit (INDEPENDENT_AMBULATORY_CARE_PROVIDER_SITE_OTHER): Admitting: Neurology

## 2024-03-13 ENCOUNTER — Encounter: Payer: Self-pay | Admitting: Neurology

## 2024-03-13 VITALS — BP 118/84 | HR 73 | Ht 71.0 in | Wt 215.6 lb

## 2024-03-13 DIAGNOSIS — B20 Human immunodeficiency virus [HIV] disease: Secondary | ICD-10-CM

## 2024-03-13 DIAGNOSIS — R9089 Other abnormal findings on diagnostic imaging of central nervous system: Secondary | ICD-10-CM

## 2024-03-13 DIAGNOSIS — G4489 Other headache syndrome: Secondary | ICD-10-CM | POA: Diagnosis not present

## 2024-03-13 DIAGNOSIS — F0280 Dementia in other diseases classified elsewhere without behavioral disturbance: Secondary | ICD-10-CM | POA: Diagnosis not present

## 2024-03-13 DIAGNOSIS — G969 Disorder of central nervous system, unspecified: Secondary | ICD-10-CM

## 2024-03-13 MED ORDER — DIVALPROEX SODIUM ER 500 MG PO TB24: 1500.0000 mg | ORAL_TABLET | Freq: Every evening | ORAL | 3 refills | Status: DC

## 2024-03-13 NOTE — Progress Notes (Signed)
 ASSESSMENT AND PLAN  Arthur [name] is a 43 y.o. male   Worsening headache HIV, history of central nervous system toxoplasmosis, neurosyphilis, Encephalopathy Reported seizure-like spells - Check 72-hour ambulatory video EEG  - Routine EEG was normal - Continue Depakote  ER 1500 mg daily, headaches are better   -Check Depakote  level - Continue Imitrex  100 mg as needed for acute migraine - MRI of the brain with and without contrast July 21, 2023 showed no acute intracranial abnormality, multifocal cerebellar encephalomalacia gliosis, multiple subcortical hypodensity signal abnormality, age advanced volume loss  -Lumbar puncture May 2025 showed normal opening pressure, elevated total protein 99, compared to previous significantly elevated level (April 2021 143, Nov 2020 > 600), normal glucose level, 0 WBC, steady decline of VDRL titer  Follow-up with Dr. Gracie Lav in 6 months  DIAGNOSTIC DATA (LABS, IMAGING, TESTING) - I reviewed patient records, labs, notes, testing and imaging myself where available.   MEDICAL HISTORY:  Arthur [name] is a 43 year old male, accompanied by his sister, also connected with mother via phone, seen in request by his infectious specialist Dr. Ernie Heal, [facility] for evaluation of increased headache,  History is obtained from the patient and review of electronic medical records. I personally reviewed pertinent available imaging films in PACS.   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, complicated by neurosyphilis, optic retinitis, neuritis, advanced HIV, severe dysphagia, esophageal candidiasis  He was treated with IV fluconazole , PEG tube placement for nutritional support, hospital course also showed progressive hyponatremia, due to SIADH was treated with hypertonic saline, IV Lasix , with gradually improving cephalopathy, confusion,  Ever since then, he has frequent headaches, lives at home with his mother, taking Depakote  DR to 50 mg 2 tablets twice a day has a headache prevention, Imitrex  50 mg as needed  Over past 3 months, mother reported increased frequency of headache up to 4 times each week, and longer duration, less optimal response to Imitrex   There is no new focal signs, baseline gait abnormality, needing mother's help to keep up with his medication ambulate with a cane  He is a poor historian, described holoacranial headaches, but could not elaborate on more details,  Reviewed infectious disease Dr. Fontaine Ice evaluation on July 20, 2023, increased frequency of headache, and sleepiness, similar to prior presentation when CNS infection recurred, currently on Bactrim  for toxoplasmosis prophylaxis,  MRI brain w/wo in Oct 2024,  1. No acute intracranial abnormality. 2. Unchanged multifocal cerebellar encephalomalacia and gliosis, and multifocal subcortical hyperintense T2-weighted signal, likely sequelae of remote infection. 3. Age advanced volume loss, unchanged  Lab in 2024, RPR 1:16, normal CMP, CBC, HIV-2 RNA 20,  CD4 298, 14%  Update March 13, 2024 SS: Dr. Gracie Lav increased Depakote  April 2025 to 1500 mg daily due to reported increased headache, seizure spells. LP May 2025 normal opening pressure, elevated total protein 99 but much better than previous, normal glucose level, 0 WBC, steady decline in VDRL titer. EEG was normal. Today, MMSE 16/30 with his sister. Reports seizures started in April,  described as drowsy,  shaky. Recent last week, legs shaking uncontrolled, a lot more confused during spell, lasts for an hour, he still gets up and moves around, able to go outside and smoke a cigarette. Headaches are much better, takes Imitrex  with good benefit, few times a month, sometimes none. Doesn't drive.   PHYSICAL EXAM:   Vitals:   03/13/24 0739  BP: 118/84  Pulse: 73  Weight: 215 lb 9.6 oz (97.8 kg)  Height: 5' 11 (1.803 m)   Not recorded    Physical Exam  General: The patient is alert and cooperative at the time of the examination.  Disheveled.  Skin: No significant peripheral edema is noted.  Neurologic Exam  Mental status: The patient is alert and oriented x 3 at the time of the examination.  Relies on his sister for history.  Inattentive.   Cranial nerves: Facial symmetry is present. Speech is normal, no aphasia or dysarthria is noted.  Limited vision to the right eye.  Motor: The patient has good strength in all 4 extremities.  Sensory examination: Soft touch sensation is symmetric on the face, arms, and legs.  Coordination: The patient has good finger-nose-finger and heel-to-shin bilaterally.  Gait and station: Gait is cautious, steady with single-point cane but slow  Reflexes: Deep tendon reflexes are symmetric.  REVIEW OF SYSTEMS:  Full 14 system review of systems performed and notable only for as above All other review of systems were negative.   ALLERGIES: No Known Allergies  HOME MEDICATIONS: Current Outpatient Medications  Medication Sig Dispense Refill   acetaminophen  (TYLENOL ) 500 MG tablet Take 500 mg by mouth every 6 (six) hours as needed.     atorvastatin  (LIPITOR) 20 MG tablet Take 1 tablet (20 mg total) by mouth daily. 30 tablet 11   bictegravir-emtricitabine -tenofovir  AF (BIKTARVY ) 50-200-25 MG TABS tablet Take 1 tablet by mouth daily. 30 tablet 11   divalproex  (DEPAKOTE  ER) 500 MG 24 hr tablet Take 3 tablets (1,500 mg total) by mouth at  bedtime. 90 tablet 5   etodolac  (LODINE ) 300 MG capsule Take 1 capsule (300 mg total) by mouth 2 (two) times daily. 20 capsule 0   fostemsavir tromethamine  (RUKOBIA ) 600 MG TB12 ER tablet Take 1 tablet (600 mg total) by mouth 2 (two) times daily. 60 tablet 11   hydrOXYzine  (ATARAX ) 25 MG tablet Take 1 tablet (25 mg total) by mouth at bedtime as needed (insomnia). 30 tablet 11   Menthol -Methyl Salicylate  (MUSCLE RUB) 10-15 % CREA Apply 1 application topically as needed for muscle pain.  0   ondansetron  (ZOFRAN ) 4 MG tablet Take 1 tablet (4 mg total) by mouth every 6 (six) hours as needed for nausea. 120 tablet 4   sulfamethoxazole -trimethoprim  (BACTRIM  DS) 800-160 MG tablet Take 1 tablet by mouth 2 (two) times daily. 60 tablet 11   SUMAtriptan  (IMITREX ) 100 MG tablet May repeat in 2 hours if headache persists or recurs. 10 tablet 11   traMADol  (ULTRAM ) 50 MG tablet Take 1-2 tablets (50-100 mg total) by mouth every 6 (six) hours as needed. 20 tablet 0   No current facility-administered medications for this visit.    PAST MEDICAL HISTORY: Past Medical History:  Diagnosis Date   Anemia    Anxiety    Ataxia 11/08/2021   Depression    GERD (gastroesophageal reflux disease)    Headache    Headache syndrome 02/04/2021   HIV infection (HCC)    HIV infection with neurological disease (HCC) 08/13/2021   Insomnia 01/26/2021   Seizure disorder (  HCC) 01/17/2022   Sleepiness 07/20/2023   Substance abuse (HCC)    Vaccine counseling 08/13/2021    PAST SURGICAL HISTORY: Past Surgical History:  Procedure Laterality Date   HIGH RESOLUTION ANOSCOPY N/A 11/29/2023   Procedure: ANOSCOPY, HIGH RESOLUTION;  Surgeon: Joyce Nixon, MD;  Location: [facility];  Service: General;  Laterality: N/A;   IR GASTROSTOMY TUBE MOD SED  09/02/2019   IR GASTROSTOMY TUBE REMOVAL  10/28/2019   RECTAL BIOPSY N/A 11/29/2023   Procedure: BIOPSY AND ABLATION;  Surgeon: Joyce Nixon, MD;  Location: [facility];  Service: General;  Laterality: N/A;   TOOTH EXTRACTION      FAMILY HISTORY: Family History  Problem Relation Age of Onset   Diabetes Neg Hx    CAD Neg Hx    Hypertension Neg Hx     SOCIAL HISTORY: Social History   Socioeconomic History   Marital status: Single    Spouse name: Not on file   Number of children: Not on file   Years of education: Not on file   Highest education level: Not on file  Occupational History   Not on file  Tobacco Use   Smoking status: Every Day    Current packs/day: 0.40    Average packs/day: 0.4 packs/day for 17.0 years (6.8 ttl pk-yrs)    Types: Cigarettes    Passive exposure: Past   Smokeless tobacco: Never  Vaping Use   Vaping status: Never Used  Substance and Sexual Activity   Alcohol use: Not Currently    Comment: occassionally   Drug use: Yes    Frequency: 7.0 times per week    Types: Marijuana    Comment: last use 11/27/2023   Sexual activity: Not Currently    Partners: Male    Comment: declined condoms 09/15/20  Other Topics Concern   Not on file  Social History Narrative   Right Handed   Drinks 2-3 cups caffeine daily   Social Drivers of Health   Financial Resource Strain: Not on file  Food Insecurity: Not on file  Transportation Needs: Not on file  Physical Activity: Not on file  Stress: Not on file  Social Connections: Not on file  Intimate Partner Violence: Not on file   Cortland Ding, DNP  [facility] [address], [address] [place], [place] [address] [phone]

## 2024-03-13 NOTE — Patient Instructions (Signed)
 Continue Depakote .  Check blood level today.  Check ambulatory 72-hour video EEG to try and capture seizure-like spell.  Keep record of seizure events.  Call for any worsening symptoms.  Follow-up in 6 months.  Thanks!!

## 2024-03-13 NOTE — Progress Notes (Signed)
 Chart reviewed, agree above plan ?

## 2024-03-14 ENCOUNTER — Ambulatory Visit: Payer: Self-pay | Admitting: Neurology

## 2024-03-14 ENCOUNTER — Telehealth: Payer: Self-pay

## 2024-03-14 DIAGNOSIS — G4489 Other headache syndrome: Secondary | ICD-10-CM

## 2024-03-14 DIAGNOSIS — R9089 Other abnormal findings on diagnostic imaging of central nervous system: Secondary | ICD-10-CM

## 2024-03-14 DIAGNOSIS — F0280 Dementia in other diseases classified elsewhere without behavioral disturbance: Secondary | ICD-10-CM

## 2024-03-14 DIAGNOSIS — B20 Human immunodeficiency virus [HIV] disease: Secondary | ICD-10-CM

## 2024-03-14 LAB — VALPROIC ACID LEVEL: Valproic Acid Lvl: 101 ug/mL — ABNORMAL HIGH (ref 50–100)

## 2024-03-14 NOTE — Telephone Encounter (Signed)
 Call to mom, and advised that Arthur [name] didn't have any speech concerns and could get referral from PCP if wanted to proceed. Mom appreciative and verbalized understanding

## 2024-03-14 NOTE — Telephone Encounter (Signed)
 Call to mother, reviewed lab results and informed that she should received a call from [facility] for ambulatory EEG. She would like to know if [facility]ah would agree to speech therapy eval as mother thinks this speech is thicker. Advised I would send to sarah to review. Mother denies and swelling of face or tongue.

## 2024-03-18 NOTE — Telephone Encounter (Signed)
 Arthur [name]

## 2024-03-27 NOTE — Telephone Encounter (Signed)
 Pt's mother has called to report that the company that was supposed to do the EEG does not take pt's insurance, please call pt's mother Lysbeth Ada [phone] )to discuss.

## 2024-03-27 NOTE — Telephone Encounter (Signed)
 Call to patients mom, she reports that her out of pocket cost with [facility] is $700+. She is requesting it be sent somewhere else. Advised I would let Dr. Gregg know but she still may have out of pocket costs. She was appreciative and verbalized understanding.

## 2024-04-05 DIAGNOSIS — R569 Unspecified convulsions: Secondary | ICD-10-CM | POA: Diagnosis not present

## 2024-04-05 DIAGNOSIS — R259 Unspecified abnormal involuntary movements: Secondary | ICD-10-CM

## 2024-04-05 DIAGNOSIS — R410 Disorientation, unspecified: Secondary | ICD-10-CM

## 2024-04-05 NOTE — Telephone Encounter (Signed)
 Not my patient, but we can do a in house 2 hours EEG with [facility].

## 2024-04-08 NOTE — Addendum Note (Signed)
 Addended by: JOSHUA IZETTA CROME on: 04/08/2024 07:13 AM   Modules accepted: Orders

## 2024-04-08 NOTE — Telephone Encounter (Signed)
 Call to mom, she reports that the Ambulatory EEG was performed Wednesday but she kept getting error codes and machine saying not recorded. She is not sure if they did partial pro bono due to high copay but sis have the test. I advised Dr. ONITA was out of the office but Dr. Gregg had ordered a 2 hour in office EEG. Will advise Dr. Gregg about ambulatory and see if he sees any results. Mother appreciative

## 2024-04-10 ENCOUNTER — Ambulatory Visit: Payer: 59 | Admitting: Neurology

## 2024-04-12 ENCOUNTER — Encounter (INDEPENDENT_AMBULATORY_CARE_PROVIDER_SITE_OTHER): Payer: Self-pay | Admitting: Neurology

## 2024-04-12 DIAGNOSIS — G4489 Other headache syndrome: Secondary | ICD-10-CM

## 2024-04-12 DIAGNOSIS — F0280 Dementia in other diseases classified elsewhere without behavioral disturbance: Secondary | ICD-10-CM

## 2024-04-12 DIAGNOSIS — B20 Human immunodeficiency virus [HIV] disease: Secondary | ICD-10-CM

## 2024-04-12 NOTE — Procedures (Signed)
 Clinical History:   Patient is a 43 year old male with history of HIV, central nervous system toxoplasmosis, presenting with worsening confusion, headaches.   INTERMITTENT MONITORING with VIDEO TECHNICAL SUMMARY:  This AVEEG was performed using equipment provided by [employer] utilizing Bluetooth ( [employer] ) amplifiers with continuous EEGT attended video collection using encrypted remote transmission via [employer] secured cellular tower network with data rates for each AVEEG performed. This is a Therapist, music AVEEG, obtained, according to the 10-20 international electrode placement system, reformatted digitally into referential and bipolar montages. Data was acquired with a minimum of 21 bipolar connections and sampled at a minimum rate of 250 cycles per second per channel, maximum rate of 450 cycles per second per channel and two channels for EKG. The entire VEEG study was recorded through cable and or radio telemetry for subsequent analysis. Specified epochs of the AVEEG data were identified at the direction of the subject by the depression of a push button by the patient. Each patients event file included data acquired two minutes prior to the push button activation and continuing until two minutes afterwards. AVEEG files were reviewed on [employer] [facility] server, Licensed Software provided by [employer] with a digital high frequency filter set at 70 Hz and a low frequency filter set at 1 Hz with a paper speed of 60mm/s resulting in 10 seconds per digital page. This entire AVEEG was reviewed by the EEG Technologist. Random time samples, random sleep samples, clips, patient initiated push button files with included patient daily diary logs, EEG Technologist pruned data was reviewed and verified for accuracy and validity by the governing reading neurologist in full details. This AEEGV was fully compliant with all requirements for CPT 97500 for setup, patient education, take down and  administered by an EEG technologist.   Long-Term EEG with Video was monitored intermittently by a qualified EEG technologist for the entirety of the recording; quality check-ins were performed at a minimum of every two hours, checking and documenting real-time data and video to assure the integrity and quality of the recording (e.g., camera position, electrode integrity and impedance), and identify the need for maintenance. For intermittent monitoring, an EEG Technologist monitored no more than 12 patients concurrently. Diagnostic video was captured at least 80% of the time during the recording.   PATIENT EVENTS:  A button press or notation was made 1 time. Patient log was reviewed with the patient at disconnect with the intent to reconcile events. Patient noted events for:  PATIENT EVENT - #1 NO BUTTON PRESS. DIARY NOTE: WAS ASLEEP AND AWAKENED BY POUNDING HEADACHE. EEG SHOWS AWAKE BACKGROUND WITHOUT ICTAL CHANGES. PATIENT IS ON CAMERA.  TECHNOLOGIST EVENTS:  No clear epileptiform activity was detected by the reviewing neurodiagnostic technologist for further review.   TIME SAMPLES:  10-minutes of every 2 hours recorded are reviewed as random time samples.   SLEEP SAMPLES:  5-minutes of every 24 hours recorded are reviewed as random sleep samples.   AWAKE:  At maximal level of alertness, the posterior dominant background activity was continuous, reactive, low voltage rhythm of 9 Hz. This was symmetric, well-modulated, and attenuated with eye opening. Diffuse, symmetric, frontocentral beta range activity was present  SLEEP:  N1 Sleep (Stage 1) was observed and characterized by the disappearance of alpha rhythm and the appearance of vertex activity.   N2 Sleep (Stage 2) was observed and characterized by vertex waves, K-complexes, and sleep spindles.   N3 (Stage 3) sleep was observed and characterized by high amplitude  Delta activity of 20%.   REM sleep was observed.   EKG:  There were no  arrhythmias or abnormalities noted during this recording.    Impression:  This is a normal 3-day ambulatory EEG tracing. No focal abnormalities or epileptiform discharges were seen. There were no electrographic seizures noted. 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 Please note a normal EEG does not exclude the diagnosis of epilepsy.   Shakai Dolley, MD [facility]

## 2024-04-12 NOTE — Telephone Encounter (Signed)
 No need to complete the 2 hrs EEG. Amb EEG was completed and it was normal. Thanks

## 2024-04-16 NOTE — Progress Notes (Unsigned)
 Subjective:  Chief complaint: follow-up for HIV disease on medications   Patient ID: Arthur [name], male    DOB: 09/02/1981, 43 y.o.   MRN: [id number]  HPI  year-old African-American man living with HIV who  fell out of care and was not seen by me since 2015.  He previously failed Atripla with resistance and IK 103 when he is being seen by Dr. Janna.  I changed him to a protease-based regimen with Prezista  Norvir  and Truvada but he had not been seen since 2015.  He was admitted to the hospital with profound encephalopathy.   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  He was also broadly treated for other possible CNS pathology including CMV encephalitis with ganciclovir  RI PE for possible tuberculosis with steroids.  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   Interim history:     He was ultimately discharged from the inpatient hospital service to inpatient rehab and completed therapy while there.   Was previously suffering from dysphagia and nutritional deficiency he was given a PEG tube through which she was getting his medications along with nutrition.   He is living at home with his mother and also his sister who is a CMA and have been overseeing his medications.   He has had dramatic improvement in his neurological status and maintained perfect virological control. We retreated him for Neurosyphilis when his titers went up. He had low hgb while on PCN but never verified this prior to completion.   His CD4 is slow to come up.   I added Rukobia  in July 2021 in hopes to help increase his CD4 count but he did not tolerate this medication with nausea and vomiting.   He did remained suppressed.   I saw Arthur [name] in March 2022   He had  been having headaches in the left side of his head sometimes wake him up at night.  He rated them as being about a 7 out of 10 in severity and  they are also of a stabbing type of nature.  They ddid  respond to Tylenol  and typically resolve within 40 minutes.  .   The hydroxyzine  seen does help him sleep   Referred him to neurology who have been working him up for headaches and also has cognitive impairments.  He is also seeing neuropsychology.   His viral load remained suppressed on labs we checked in May 2022 but CD4 still is in the 73s and was not coming up very much.   In the interim we had to change out his sulfadiazine  for clindamycin .  Several visits ago I thought it would be good idea to simplify his toxoplasma secondary prophylaxis by placing him on 1 double strength Bactrim  twice daily and getting rid of the pyrimethamine  clindamycin  and leucovorin .  WI also decided to re challenge him  with Rukobia  with antiemetic therapy may help him.   He has been able to keep the Rukobia  down with premedication.  Trip's viral load remains suppressed and CD4 count came up a bit.  He then began having more problems with balance and unable to walk without using his cane where he was able to do this before.  He also sometimes wokeup in the morning with speech that is more difficult to understand according to mom accompanies him to visit today.  Obtained an MRI of the brain which did not show any new findings  whatsoever.  He is plugged back in with neurology and is on Depakote  for seizures   CD4 at last check was 166 range x 2.   Appears to have stopped Depakote  which I thought he should be taking though I see dispense record for 90 days from [facility] in April     At last visit there was concern re patient having ecurrent headaches and increased sleepiness. The headaches are severe, frequent, and can wake the patient from sleep. The patient's family member notes that these symptoms are similar to those experienced last year when the patient had a recurrence of a brain infection requiring at-home antibiotic treatment. The patient  is currently on Biktarvy , Rukobia , Bactrim , Lipitor, and Depakote . The patient's family member confirms that the patient is adherent to the medication regimen.   We obtained an MRI of the brain in late 06/2023 which showed   IMPRESSION: 1. No acute intracranial abnormality. 2. Unchanged multifocal cerebellar encephalomalacia and gliosis, and multifocal subcortical hyperintense T2-weighted signal, likely sequelae of remote infection. 3. Age advanced volume loss, unchanged  He was seen by Neurology, Dr. Onita. She felt that his headaches did have some migrainous features.   She had contemplated LP to rule out recurrent infection but I thought this was not necessary given re-assuring MRI of the brain and fact he has been adherent to his medications    Discussed the use of AI scribe software for clinical note transcription with the patient, who gave verbal consent to proceed.  Discussed the use of AI scribe software for clinical note transcription with the patient, who gave verbal consent to proceed.  History of Present Illness                 Past Medical History:  Diagnosis Date   Anemia    Anxiety    Ataxia 11/08/2021   Depression    GERD (gastroesophageal reflux disease)    Headache    Headache syndrome 02/04/2021   HIV infection (HCC)    HIV infection with neurological disease (HCC) 08/13/2021   Insomnia 01/26/2021   Seizure disorder (HCC) 01/17/2022   Sleepiness 07/20/2023   Substance abuse (HCC)    Vaccine counseling 08/13/2021    Past Surgical History:  Procedure Laterality Date   HIGH RESOLUTION ANOSCOPY N/A 11/29/2023   Procedure: ANOSCOPY, HIGH RESOLUTION;  Surgeon: Debby Hila, MD;  Location: [facility];  Service: General;  Laterality: N/A;   IR GASTROSTOMY TUBE MOD SED  09/02/2019   IR GASTROSTOMY TUBE REMOVAL  10/28/2019   RECTAL BIOPSY N/A 11/29/2023   Procedure: BIOPSY AND ABLATION;  Surgeon: Debby Hila, MD;  Location: [facility];  Service: General;  Laterality: N/A;   TOOTH EXTRACTION      Family History  Problem Relation Age of Onset   Diabetes Neg Hx    CAD Neg Hx    Hypertension Neg Hx       Social History   Socioeconomic History   Marital status: Single    Spouse name: Not on file   Number of children: Not on file   Years of education: Not on file   Highest education level: Not on file  Occupational History   Not on file  Tobacco Use   Smoking status: Every Day    Current packs/day: 0.40    Average packs/day: 0.4 packs/day for 17.0 years (6.8 ttl pk-yrs)    Types: Cigarettes    Passive exposure: Past   Smokeless tobacco: Never  Vaping Use   Vaping status: Never Used  Substance and Sexual Activity   Alcohol use: Not Currently    Comment: occassionally   Drug use: Yes    Frequency: 7.0 times per week    Types: Marijuana    Comment: last use 11/27/2023   Sexual activity: Not Currently    Partners: Male    Comment: declined condoms 09/15/20  Other Topics Concern   Not on file  Social History Narrative   Right Handed   Drinks 2-3 cups caffeine daily   Social Drivers of Corporate investment banker Strain: Not on file  Food Insecurity: Not on file  Transportation Needs: Not on file  Physical Activity: Not on file  Stress: Not on file  Social Connections: Not on file    No Known Allergies   Current Outpatient Medications:    acetaminophen  (TYLENOL ) 500 MG tablet, Take 500 mg by mouth every 6 (six) hours as needed., Disp: , Rfl:    atorvastatin  (LIPITOR) 20 MG tablet, Take 1 tablet (20 mg total) by mouth daily., Disp: 30 tablet, Rfl: 11   bictegravir-emtricitabine -tenofovir  AF (BIKTARVY ) 50-200-25 MG TABS tablet, Take 1 tablet by mouth daily., Disp: 30 tablet, Rfl: 11   divalproex  (DEPAKOTE  ER) 500 MG 24 hr tablet, Take 3 tablets (1,500 mg total) by mouth at bedtime., Disp: 270 tablet, Rfl: 3   etodolac  (LODINE ) 300 MG capsule, Take 1 capsule (300 mg total) by mouth 2 (two)  times daily., Disp: 20 capsule, Rfl: 0   fostemsavir tromethamine  (RUKOBIA ) 600 MG TB12 ER tablet, Take 1 tablet (600 mg total) by mouth 2 (two) times daily., Disp: 60 tablet, Rfl: 11   hydrOXYzine  (ATARAX ) 25 MG tablet, Take 1 tablet (25 mg total) by mouth at bedtime as needed (insomnia)., Disp: 30 tablet, Rfl: 11   Menthol -Methyl Salicylate  (MUSCLE RUB) 10-15 % CREA, Apply 1 application topically as needed for muscle pain., Disp:  , Rfl: 0   ondansetron  (ZOFRAN ) 4 MG tablet, Take 1 tablet (4 mg total) by mouth every 6 (six) hours as needed for nausea., Disp: 120 tablet, Rfl: 4   sulfamethoxazole -trimethoprim  (BACTRIM  DS) 800-160 MG tablet, Take 1 tablet by mouth 2 (two) times daily., Disp: 60 tablet, Rfl: 11   SUMAtriptan  (IMITREX ) 100 MG tablet, May repeat in 2 hours if headache persists or recurs., Disp: 10 tablet, Rfl: 11   traMADol  (ULTRAM ) 50 MG tablet, Take 1-2 tablets (50-100 mg total) by mouth every 6 (six) hours as needed., Disp: 20 tablet, Rfl: 0    Review of Systems  Unable to perform ROS: Dementia       Objective:   Physical Exam Constitutional:      Appearance: He is well-developed.  HENT:     Head: Normocephalic and atraumatic.  Eyes:     Conjunctiva/sclera: Conjunctivae normal.  Cardiovascular:     Rate and Rhythm: Normal rate and regular rhythm.  Pulmonary:     Effort: Pulmonary effort is normal. No respiratory distress.     Breath sounds: No wheezing.  Abdominal:     General: There is no distension.     Palpations: Abdomen is soft.  Musculoskeletal:        General: No tenderness. Normal range of motion.     Cervical back: Normal range of motion and neck supple.  Skin:    General: Skin is warm and dry.     Coloration: Skin is not pale.     Findings: No erythema or rash.  Neurological:  General: No focal deficit present.     Mental Status: He is alert and oriented to person, place, and time.  Psychiatric:        Mood and Affect: Mood normal.         Speech: Speech is delayed.        Behavior: Behavior normal.        Thought Content: Thought content normal.        Cognition and Memory: He exhibits impaired recent memory and impaired remote memory.        Judgment: Judgment normal.           Assessment & Plan:   Assessment and Plan           Vaccine counseling: recommended Dtap which he received today

## 2024-04-17 ENCOUNTER — Ambulatory Visit (INDEPENDENT_AMBULATORY_CARE_PROVIDER_SITE_OTHER): Payer: Self-pay | Admitting: Infectious Disease

## 2024-04-17 ENCOUNTER — Encounter: Payer: Self-pay | Admitting: Infectious Disease

## 2024-04-17 ENCOUNTER — Other Ambulatory Visit: Payer: Self-pay

## 2024-04-17 VITALS — BP 145/78 | HR 81 | Temp 97.9°F | Ht 71.0 in | Wt 221.0 lb

## 2024-04-17 DIAGNOSIS — Z7185 Encounter for immunization safety counseling: Secondary | ICD-10-CM

## 2024-04-17 DIAGNOSIS — Z23 Encounter for immunization: Secondary | ICD-10-CM | POA: Diagnosis not present

## 2024-04-17 DIAGNOSIS — R270 Ataxia, unspecified: Secondary | ICD-10-CM

## 2024-04-17 DIAGNOSIS — G4489 Other headache syndrome: Secondary | ICD-10-CM

## 2024-04-17 DIAGNOSIS — R85610 Atypical squamous cells of undetermined significance on cytologic smear of anus (ASC-US): Secondary | ICD-10-CM

## 2024-04-17 DIAGNOSIS — B20 Human immunodeficiency virus [HIV] disease: Secondary | ICD-10-CM | POA: Diagnosis not present

## 2024-04-17 DIAGNOSIS — B582 Toxoplasma meningoencephalitis: Secondary | ICD-10-CM | POA: Diagnosis not present

## 2024-04-17 DIAGNOSIS — G40909 Epilepsy, unspecified, not intractable, without status epilepticus: Secondary | ICD-10-CM

## 2024-04-17 MED ORDER — RUKOBIA 600 MG PO TB12: 600.0000 mg | ORAL_TABLET | Freq: Two times a day (BID) | ORAL | 11 refills | Status: AC

## 2024-04-17 MED ORDER — ATORVASTATIN CALCIUM 20 MG PO TABS: 20.0000 mg | ORAL_TABLET | Freq: Every day | ORAL | 11 refills | Status: AC

## 2024-04-17 MED ORDER — BIKTARVY 50-200-25 MG PO TABS: 1.0000 | ORAL_TABLET | Freq: Every day | ORAL | 11 refills | Status: AC

## 2024-04-18 ENCOUNTER — Other Ambulatory Visit

## 2024-04-18 ENCOUNTER — Other Ambulatory Visit (HOSPITAL_COMMUNITY)
Admission: RE | Admit: 2024-04-18 | Discharge: 2024-04-18 | Disposition: A | Discharge status: Home / Self Care | Source: Ambulatory Visit | Attending: Infectious Disease | Admitting: Infectious Disease

## 2024-04-18 ENCOUNTER — Other Ambulatory Visit: Payer: Self-pay

## 2024-04-18 DIAGNOSIS — R270 Ataxia, unspecified: Secondary | ICD-10-CM

## 2024-04-18 DIAGNOSIS — B20 Human immunodeficiency virus [HIV] disease: Secondary | ICD-10-CM

## 2024-04-18 DIAGNOSIS — B582 Toxoplasma meningoencephalitis: Secondary | ICD-10-CM | POA: Diagnosis present

## 2024-04-19 LAB — URINE CYTOLOGY ANCILLARY ONLY
Chlamydia: NEGATIVE
Comment: NEGATIVE
Comment: NORMAL
Neisseria Gonorrhea: NEGATIVE

## 2024-04-19 LAB — T-HELPER CELLS (CD4) COUNT (NOT AT ARMC)
CD4 % Helper T Cell: 18 % — ABNORMAL LOW (ref 33–65)
CD4 T Cell Abs: 366 /uL — ABNORMAL LOW (ref 400–1790)

## 2024-04-20 LAB — CBC WITH DIFFERENTIAL/PLATELET
Absolute Lymphocytes: 2284 {cells}/uL (ref 850–3900)
Absolute Monocytes: 494 {cells}/uL (ref 200–950)
Basophils Absolute: 28 {cells}/uL (ref 0–200)
Basophils Relative: 0.6 %
Eosinophils Absolute: 212 {cells}/uL (ref 15–500)
Eosinophils Relative: 4.5 %
HCT: 40.5 % (ref 38.5–50.0)
Hemoglobin: 13.8 g/dL (ref 13.2–17.1)
MCH: 33.2 pg — ABNORMAL HIGH (ref 27.0–33.0)
MCHC: 34.1 g/dL (ref 32.0–36.0)
MCV: 97.4 fL (ref 80.0–100.0)
MPV: 10.2 fL (ref 7.5–12.5)
Monocytes Relative: 10.5 %
Neutro Abs: 1683 {cells}/uL (ref 1500–7800)
Neutrophils Relative %: 35.8 %
Platelets: 315 Thousand/uL (ref 140–400)
RBC: 4.16 Million/uL — ABNORMAL LOW (ref 4.20–5.80)
RDW: 13.6 % (ref 11.0–15.0)
Total Lymphocyte: 48.6 %
WBC: 4.7 Thousand/uL (ref 3.8–10.8)

## 2024-04-20 LAB — COMPLETE METABOLIC PANEL WITHOUT GFR
AG Ratio: 1.2 (calc) (ref 1.0–2.5)
ALT: 14 U/L (ref 9–46)
AST: 18 U/L (ref 10–40)
Albumin: 4.1 g/dL (ref 3.6–5.1)
Alkaline phosphatase (APISO): 57 U/L (ref 36–130)
BUN: 10 mg/dL (ref 7–25)
CO2: 26 mmol/L (ref 20–32)
Calcium: 9.2 mg/dL (ref 8.6–10.3)
Chloride: 105 mmol/L (ref 98–110)
Creat: 1.03 mg/dL (ref 0.60–1.29)
Globulin: 3.3 g/dL (ref 1.9–3.7)
Glucose, Bld: 112 mg/dL — ABNORMAL HIGH (ref 65–99)
Potassium: 4.6 mmol/L (ref 3.5–5.3)
Sodium: 138 mmol/L (ref 135–146)
Total Bilirubin: 0.3 mg/dL (ref 0.2–1.2)
Total Protein: 7.4 g/dL (ref 6.1–8.1)

## 2024-04-20 LAB — LIPID PANEL
Cholesterol: 175 mg/dL
HDL: 43 mg/dL
LDL Cholesterol (Calc): 107 mg/dL — ABNORMAL HIGH
Non-HDL Cholesterol (Calc): 132 mg/dL — ABNORMAL HIGH
Total CHOL/HDL Ratio: 4.1 (calc)
Triglycerides: 141 mg/dL

## 2024-04-20 LAB — RPR: RPR Ser Ql: REACTIVE — AB

## 2024-04-20 LAB — T PALLIDUM AB: T Pallidum Abs: POSITIVE — AB

## 2024-04-20 LAB — HIV-1 RNA QUANT-NO REFLEX-BLD
HIV 1 RNA Quant: <20 {copies}/mL — AB
HIV-1 RNA Quant, Log: <1.3 {Log_copies}/mL — AB

## 2024-04-20 LAB — RPR TITER: RPR Titer: 1:16 {titer} — ABNORMAL HIGH

## 2024-05-16 ENCOUNTER — Ambulatory Visit

## 2024-09-12 ENCOUNTER — Ambulatory Visit: Admitting: Neurology

## 2024-09-12 ENCOUNTER — Encounter: Payer: Self-pay | Admitting: Neurology

## 2024-09-12 VITALS — BP 125/84 | HR 77 | Ht 71.0 in | Wt 219.0 lb

## 2024-09-12 DIAGNOSIS — F0280 Dementia in other diseases classified elsewhere without behavioral disturbance: Secondary | ICD-10-CM

## 2024-09-12 DIAGNOSIS — G4489 Other headache syndrome: Secondary | ICD-10-CM | POA: Diagnosis not present

## 2024-09-12 DIAGNOSIS — B20 Human immunodeficiency virus [HIV] disease: Secondary | ICD-10-CM

## 2024-09-12 DIAGNOSIS — G969 Disorder of central nervous system, unspecified: Secondary | ICD-10-CM | POA: Diagnosis not present

## 2024-09-12 MED ORDER — DIVALPROEX SODIUM ER 500 MG PO TB24: 1500.0000 mg | ORAL_TABLET | Freq: Every evening | ORAL | 3 refills | Status: AC

## 2024-09-12 NOTE — Progress Notes (Signed)
 ASSESSMENT AND PLAN  Arthur [name] is a 43 y.o. male   HIV, history of central nervous system toxoplasmosis, neurosyphilis, Encephalopathy Chronic migraine Reported seizure-like spells   72-hour ambulatory video EEG was normal in June 2025 Continue Depakote  ER 1500 mg daily, help his mood and headache Imitrex  100 mg as needed for acute migraine   Follow-up in12 months  DIAGNOSTIC DATA (LABS, IMAGING, TESTING) - I reviewed patient records, labs, notes, testing and imaging myself where available.   MRI of the brain with and without contrast July 21, 2023 showed no acute intracranial abnormality, multifocal cerebellar encephalomalacia gliosis, multiple subcortical hypodensity signal abnormality, age advanced volume loss   Lumbar puncture May 2025 showed normal opening pressure, elevated total protein 99, much improved compared to previous significantly elevated level (April 2021 143, Nov 2020 > 600), normal glucose level, 0 WBC, steady decline of VDRL titer  MEDICAL HISTORY:  Arthur [name] is a 43 year old male, accompanied by his sister, also connected with mother via phone, seen in request by his infectious specialist Dr. Fleeta Rothman, [place] for evaluation of increased headache,  History is obtained from the patient and review of electronic medical records. I personally reviewed pertinent available imaging films in PACS.   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 Jenel in the past, most recent follow-up was with Lauraine in May 2023 for headaches, also has history of HIV infection, cerebral toxoplasmosis, neurosyphilis  Hospital admission in November 2020 for dizziness weakness intermittent headache,, noncompliant with his medications, MRI of the brain revealed widespread diffuse abnormal contrast in both hemisphere, predominantly left anterior frontal matter, premature atrophy, right visual field loss, workup revealed CNS  toxoplasmosis, complicated by neurosyphilis, optic retinitis, neuritis, advanced HIV, severe dysphagia, esophageal candidiasis  He was treated with IV fluconazole , PEG tube placement for nutritional support, hospital course also showed progressive hyponatremia, due to SIADH was treated with hypertonic saline, IV Lasix , with gradually improving ecephalopathy, confusion,  Ever since then, he has frequent headaches, lives at home with his mother, taking Depakote  DR 250 mg 2 tablets twice a day as headache prevention, Imitrex  50 mg as needed  Over past 3 months, mother reported increased frequency of headache up to 4 times each week, and longer duration, less optimal response to Imitrex   There is no new focal signs, baseline gait abnormality, needing mother's help to keep up with his medication ambulate with a cane  He is a poor historian, described holoacranial headaches, but could not elaborate on more details,  Reviewed infectious disease Dr. Lindia evaluation on July 20, 2023, increased frequency of headache, and sleepiness, similar to prior presentation when CNS infection recurred, currently on Bactrim  for toxoplasmosis prophylaxis,  MRI brain w/wo in Oct 2024,  1. No acute intracranial abnormality. 2. Unchanged multifocal cerebellar encephalomalacia and gliosis, and multifocal subcortical hyperintense T2-weighted signal, likely sequelae of remote infection. 3. Age advanced volume loss, unchanged  Lab in 2024, RPR 1:16, normal CMP, CBC, HIV-2 RNA 20,  CD4 298, 14%  UPDATE Sep 12 2024: Lives at home with his mother, has two sister, given by one of his sister at today's visit, overall doing well, headache comes and goes, Imitrex  as needed works well, use 4 to 5 tablets every month  Tolerating Depakote  DR 500 mg 3 tablets every night  Lab July 2025: Positive RPR, T. pallidum antibody, history of treated neurosyphilis, HIV titer less than 20, continually improved with CD4 count 366  PHYSICAL EXAM:   Vitals:   09/12/24 0901  BP: 125/84  Pulse: 77  SpO2: 98%  Weight: 219 lb (99.3 kg)  Height: 5' 11 (1.803 m)     PHYSICAL EXAMNIATION:  Gen: NAD, conversant, well nourised, well groomed                     Cardiovascular: Regular rate rhythm, no peripheral edema, warm, nontender. Eyes: Conjunctivae clear without exudates or hemorrhage Neck: Supple, no carotid bruits. Pulmonary: Clear to auscultation bilaterally   NEUROLOGICAL EXAM:  MENTAL STATUS: Speech/cognition: Slurred speech, cooperative on examination, CRANIAL NERVES: CN II: Visual fields are full to confrontation.  CN III, IV, VI: Right exotropia, left eye dominant CN V: Facial sensation is intact to pinprick in all 3 divisions bilaterally. Corneal responses are intact.  CN VII: Face is symmetric with normal eye closure and smile. CN VIII: Hearing is normal to casual conversation CN IX, X: Palate elevates symmetrically. Phonation is normal. CN XI: Head turning and shoulder shrug are intact CN XII: Tongue is midline with normal movements and no atrophy.  MOTOR: Fixation of left upper extremity on rapid rotating movement  REFLEXES: Reflexes are 1 and symmetric   SENSORY: Intact to light touch, pinprick, positional and vibratory sensation are intact in fingers and toes.  COORDINATION: Rapid alternating movements and fine finger movements are intact. There is no dysmetria on finger-to-nose and heel-knee-shin.    GAIT/STANCE: Push-up, dragging left leg    REVIEW OF SYSTEMS:  Full 14 system review of systems performed and notable only for as above All other review of systems were negative.   ALLERGIES: No Known Allergies  HOME MEDICATIONS: Current Outpatient Medications  Medication Sig Dispense Refill   acetaminophen  (TYLENOL ) 500 MG tablet Take 500 mg by mouth every 6 (six) hours as needed.     atorvastatin  (LIPITOR) 20 MG tablet Take 1 tablet (20 mg total) by mouth daily. 30 tablet 11    bictegravir-emtricitabine -tenofovir  AF (BIKTARVY ) 50-200-25 MG TABS tablet Take 1 tablet by mouth daily. 30 tablet 11   divalproex  (DEPAKOTE  ER) 500 MG 24 hr tablet Take 3 tablets (1,500 mg total) by mouth at bedtime. 270 tablet 3   etodolac  (LODINE ) 300 MG capsule Take 1 capsule (300 mg total) by mouth 2 (two) times daily. 20 capsule 0   fostemsavir tromethamine  (RUKOBIA ) 600 MG TB12 ER tablet Take 1 tablet (600 mg total) by mouth 2 (two) times daily. 60 tablet 11   hydrOXYzine  (ATARAX ) 25 MG tablet Take 1 tablet (25 mg total) by mouth at bedtime as needed (insomnia). 30 tablet 11   Menthol -Methyl Salicylate  (MUSCLE RUB) 10-15 % CREA Apply 1 application topically as needed for muscle pain.  0   ondansetron  (ZOFRAN ) 4 MG tablet Take 1 tablet (4 mg total) by mouth every 6 (six) hours as needed for nausea. 120 tablet 4   SUMAtriptan  (IMITREX ) 100 MG tablet May repeat in 2 hours if headache persists or recurs. 10 tablet 11   traMADol  (ULTRAM ) 50 MG tablet Take 1-2 tablets (50-100 mg total) by mouth every 6 (six) hours as needed. 20 tablet 0   No current facility-administered medications for this visit.    PAST MEDICAL HISTORY: Past Medical History:  Diagnosis Date   Anemia    Anxiety    Ataxia 11/08/2021   Depression    GERD (gastroesophageal reflux disease)    Headache    Headache syndrome 02/04/2021   HIV infection (HCC)    HIV infection  with neurological disease (HCC) 08/13/2021   Insomnia 01/26/2021   Seizure disorder (HCC) 01/17/2022   Sleepiness 07/20/2023   Substance abuse (HCC)    Vaccine counseling 08/13/2021    PAST SURGICAL HISTORY: Past Surgical History:  Procedure Laterality Date   HIGH RESOLUTION ANOSCOPY N/A 11/29/2023   Procedure: ANOSCOPY, HIGH RESOLUTION;  Surgeon: Debby Hila, MD;  Location: [facility];  Service: General;  Laterality: N/A;   IR GASTROSTOMY TUBE MOD SED  09/02/2019   IR GASTROSTOMY TUBE REMOVAL  10/28/2019   RECTAL BIOPSY N/A  11/29/2023   Procedure: BIOPSY AND ABLATION;  Surgeon: Debby Hila, MD;  Location: [facility];  Service: General;  Laterality: N/A;   TOOTH EXTRACTION      FAMILY HISTORY: Family History  Problem Relation Age of Onset   Diabetes Neg Hx    CAD Neg Hx    Hypertension Neg Hx     SOCIAL HISTORY: Social History   Socioeconomic History   Marital status: Single    Spouse name: Not on file   Number of children: Not on file   Years of education: Not on file   Highest education level: Not on file  Occupational History   Not on file  Tobacco Use   Smoking status: Every Day    Current packs/day: 0.40    Average packs/day: 0.4 packs/day for 17.0 years (6.8 ttl pk-yrs)    Types: Cigarettes    Passive exposure: Past   Smokeless tobacco: Never  Vaping Use   Vaping status: Never Used  Substance and Sexual Activity   Alcohol use: Yes    Comment: occassionally   Drug use: Yes    Frequency: 7.0 times per week    Types: Marijuana    Comment: daily   Sexual activity: Not Currently    Partners: Male    Comment: declined condoms 09/15/20  Other Topics Concern   Not on file  Social History Narrative   Right Handed   Drinks 2-3 cups caffeine daily   Social Drivers of Health   Tobacco Use: High Risk (09/12/2024)   Patient History    Smoking Tobacco Use: Every Day    Smokeless Tobacco Use: Never    Passive Exposure: Past  Financial Resource Strain: Not on file  Food Insecurity: Not on file  Transportation Needs: Not on file  Physical Activity: Not on file  Stress: Not on file  Social Connections: Not on file  Intimate Partner Violence: Not on file  Depression (PHQ2-9): Low Risk (09/07/2023)   Depression (PHQ2-9)    PHQ-2 Score: 0  Alcohol Screen: Not on file  Housing: Not on file  Utilities: Not on file  Health Literacy: Not on file    Modena Callander. M.D. Ph.D.

## 2024-10-09 ENCOUNTER — Ambulatory Visit: Admitting: Infectious Disease

## 2024-10-10 ENCOUNTER — Telehealth: Payer: Self-pay | Admitting: Infectious Disease

## 2024-10-10 NOTE — Telephone Encounter (Signed)
 Arthur [name] called requesting to speak with a nurse regarding renewing his handicap sticker. Pt can be reached at [phone].

## 2024-10-14 ENCOUNTER — Emergency Department (HOSPITAL_COMMUNITY)
Admission: EM | Admit: 2024-10-14 | Discharge: 2024-10-14 | Disposition: A | Discharge status: Home / Self Care | Attending: Emergency Medicine | Admitting: Emergency Medicine

## 2024-10-14 ENCOUNTER — Other Ambulatory Visit: Payer: Self-pay

## 2024-10-14 DIAGNOSIS — R569 Unspecified convulsions: Secondary | ICD-10-CM | POA: Insufficient documentation

## 2024-10-14 DIAGNOSIS — Z21 Asymptomatic human immunodeficiency virus [HIV] infection status: Secondary | ICD-10-CM | POA: Diagnosis not present

## 2024-10-14 NOTE — Discharge Instructions (Signed)
 Thank you for letting us  take care of you today.  You came in today for evaluation of potential seizure.  Based on your description of symptoms we think this is may have been related more so to a syncopal episode.  We recommend that you follow-up with your neurologist as scheduled.  Please come back to the emergency department if you have any recurrence or worsening symptoms.

## 2024-10-14 NOTE — ED Triage Notes (Signed)
 P0t bib ems for seizure witnessed by family lasting approx 1 min Pt reports standing up to cough and then his sister standing over him  Last seizure approx 6months ago, compliant with all medications. Per EMS pt did not hit head or fall but per pt he believes he did fall on the floor and hit head. Initally was post ictal with ems but is since come around, aox4. Able to ambulate to bed from stretcher H/o TBI, seizures  Aox4 130/80 90% RA 96% 2L [place]  CBG 125  70 NSR

## 2024-10-14 NOTE — ED Provider Notes (Signed)
 " [facility] Provider Note   CSN: [id number] Arrival date & time: 10/14/24  2030     Patient presents with: Seizures   Arthur [name] is a 44 y.o. male with past medical history notable for HIV infection, cerebral toxoplasmosis, neurosyphilis who presents today for evaluation of possible seizure-like activity.  Patient reports that he has been in his normal state of health without any recent illness, fevers, chills, cough, congestion.  Was at home today when he developed a coughing fit.  Subsequently sat up and had a syncopal episode.  Family witnessed that he did fall and had increased tonicity and low amplitude tremoring.  The symptoms lasted for approximately 30 seconds before patient regained consciousness and was talking to family.  Immediately returned to baseline.  Patient did strike his head on a coffee table on his way down.  Currently is without any ongoing symptoms such as headache, vision changes, peripheral weakness or numbness.  Patient has been compliant with his medications.   Seizures      Prior to Admission medications  Medication Sig Start Date End Date Taking? Authorizing Provider  acetaminophen  (TYLENOL ) 500 MG tablet Take 500 mg by mouth every 6 (six) hours as needed.    [provider]  atorvastatin  (LIPITOR) 20 MG tablet Take 1 tablet (20 mg total) by mouth daily. 04/17/24   Fleeta Kathie Jomarie LOISE, MD  bictegravir-emtricitabine -tenofovir  AF (BIKTARVY ) 50-200-25 MG TABS tablet Take 1 tablet by mouth daily. 04/17/24   Fleeta Kathie Jomarie LOISE, MD  divalproex  (DEPAKOTE  ER) 500 MG 24 hr tablet Take 3 tablets (1,500 mg total) by mouth at bedtime. 09/12/24   Onita Duos, MD  etodolac  (LODINE ) 300 MG capsule Take 1 capsule (300 mg total) by mouth 2 (two) times daily. 01/16/23   Hildegard Loge, PA-C  fostemsavir tromethamine  (RUKOBIA ) 600 MG TB12 ER tablet Take 1 tablet (600 mg total) by mouth 2 (two) times daily. 04/17/24   Fleeta Kathie Jomarie LOISE, MD  hydrOXYzine  (ATARAX ) 25 MG tablet Take 1 tablet (25 mg total) by mouth at bedtime as needed (insomnia). 01/17/22   Fleeta Kathie Jomarie LOISE, MD  Menthol -Methyl Salicylate  (MUSCLE RUB) 10-15 % CREA Apply 1 application topically as needed for muscle pain. 09/26/19   Love, Sharlet RAMAN, PA-C  ondansetron  (ZOFRAN ) 4 MG tablet Take 1 tablet (4 mg total) by mouth every 6 (six) hours as needed for nausea. 07/05/22   Fleeta Kathie Jomarie LOISE, MD  SUMAtriptan  (IMITREX ) 100 MG tablet May repeat in 2 hours if headache persists or recurs. 07/25/23   Onita Duos, MD  traMADol  (ULTRAM ) 50 MG tablet Take 1-2 tablets (50-100 mg total) by mouth every 6 (six) hours as needed. 11/29/23   Debby Hila, MD    Allergies: Patient has no known allergies.    Review of Systems  Neurological:  Positive for seizures.    Updated Vital Signs BP (!) 140/94   Pulse 62   Temp 98.1 F (36.7 C) (Oral)   Resp 18   Ht 5' 11 (1.803 m)   Wt 97.5 kg   SpO2 95%   BMI 29.99 kg/m   Physical Exam Constitutional:      General: He is not in acute distress.    Appearance: Normal appearance.  HENT:     Head: Normocephalic.     Right Ear: External ear normal.     Left Ear: External ear normal.     Nose: Nose normal.  Mouth/Throat:     Mouth: Mucous membranes are moist.  Eyes:     Pupils: Pupils are equal, round, and reactive to light.  Cardiovascular:     Rate and Rhythm: Normal rate and regular rhythm.     Pulses: Normal pulses.  Pulmonary:     Effort: Pulmonary effort is normal.     Breath sounds: Normal breath sounds.  Abdominal:     General: Abdomen is flat.  Musculoskeletal:        General: Normal range of motion.     Cervical back: Normal range of motion.  Skin:    General: Skin is warm.     Capillary Refill: Capillary refill takes less than 2 seconds.  Neurological:     General: No focal deficit present.     Mental Status: He is alert and oriented to person, place, and time.  Psychiatric:         Mood and Affect: Mood normal.     (all labs ordered are listed, but only abnormal results are displayed) Labs Reviewed - No data to display  EKG: None  Radiology: No results found.  Procedures   Medications Ordered in the ED - No data to display                                Medical Decision Making  Patient is a 44 year old male who presents today for evaluation of possible seizure-like activities.  On initial assessment patient was noted to be hemodynamically stable and afebrile.  On bedside assessment patient was noted be resting company without acute distress.  Physical examination without any acute abnormalities.  Patient is also at baseline per himself as well as family at bedside.  I did perform an EKG that showed normal sinus rhythm with a regular intervals.  I suspect the patient's symptoms related to a syncopal episode today.  I do not think that his shaking and tremors were related to frank seizures.  Patient is had extensive outpatient workup with neurology for potential seizures including an MRI as well as other EEG that did not show any evidence of epileptiform activity.  Given the fact that patient has otherwise been at his baseline and without any ongoing neurologic symptoms I do not think that he warrants laboratory evaluation or imaging studies at this point in time.  Lower concerns at this time for significant infection, meningitis, intracranial mass given prior workup pain chronic medical history.  Recommended that he follow-up with neurology for further evaluation.  Return precautions discussed.  Patient and family agreeable with plan.   Final diagnoses:  Seizure-like activity [facility])    ED Discharge Orders     None          Laurita Sieving, MD 10/14/24 2206    Bari Roxie HERO, DO 10/14/24 2344  "

## 2024-10-15 ENCOUNTER — Telehealth: Payer: Self-pay | Admitting: Neurology

## 2024-10-15 NOTE — Telephone Encounter (Signed)
 Pt's mother requested an appointment as a result of pt having seizure like activity and going to the hospital as a result of lost of oxygent to the brain according to pt's mother.

## 2024-10-17 ENCOUNTER — Encounter: Payer: Self-pay | Admitting: Neurology

## 2024-10-17 ENCOUNTER — Ambulatory Visit (INDEPENDENT_AMBULATORY_CARE_PROVIDER_SITE_OTHER): Admitting: Neurology

## 2024-10-17 VITALS — BP 127/84 | HR 74 | Ht 71.0 in | Wt 219.5 lb

## 2024-10-17 DIAGNOSIS — B20 Human immunodeficiency virus [HIV] disease: Secondary | ICD-10-CM

## 2024-10-17 DIAGNOSIS — R9089 Other abnormal findings on diagnostic imaging of central nervous system: Secondary | ICD-10-CM | POA: Diagnosis not present

## 2024-10-17 DIAGNOSIS — F0280 Dementia in other diseases classified elsewhere without behavioral disturbance: Secondary | ICD-10-CM | POA: Diagnosis not present

## 2024-10-17 DIAGNOSIS — R569 Unspecified convulsions: Secondary | ICD-10-CM

## 2024-10-17 NOTE — Progress Notes (Signed)
 "  ASSESSMENT AND PLAN  Arthur [name] is a 44 y.o. male   HIV, history of central nervous system toxoplasmosis, neurosyphilis, Encephalopathy Chronic migraine Reported seizure-like spells  Report of continued seizure-like spells, most recent sounds like posttussive syncope, but reports continued spells despite Depakote  ER 1500 mg daily, up to a few per month   Check MRI of the brain with and without contrast   72-hour ambulatory video EEG was normal in June 2025  Routine EEG was normal April 2025  Depakote  level was 101 Continue Depakote  ER 1500 mg daily Imitrex  100 mg as needed for acute migraine   DIAGNOSTIC DATA (LABS, IMAGING, TESTING) - I reviewed patient records, labs, notes, testing and imaging myself where available.  MRI of the brain with and without contrast July 21, 2023 showed no acute intracranial abnormality, multifocal cerebellar encephalomalacia gliosis, multiple subcortical hypodensity signal abnormality, age advanced volume loss   Lumbar puncture May 2025 showed normal opening pressure, elevated total protein 99, much improved compared to previous significantly elevated level (April 2021 143, Nov 2020 > 600), normal glucose level, 0 WBC, steady decline of VDRL titer  MEDICAL HISTORY:  Arthur [name] is a 44 year old male, accompanied by his sister, also connected with mother via phone, seen in request by his infectious specialist Dr. Fleeta Rothman, Jomarie for evaluation of increased headache,  History is obtained from the patient and review of electronic medical records. I personally reviewed pertinent available imaging films in PACS.   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 Jenel in the past, most recent follow-up was with Lauraine in May 2023 for headaches, also has history of HIV infection, cerebral toxoplasmosis, neurosyphilis  Hospital admission in November 2020 for dizziness weakness intermittent headache,,  noncompliant with his medications, MRI of the brain revealed widespread diffuse abnormal contrast in both hemisphere, predominantly left anterior frontal matter, premature atrophy, right visual field loss, workup revealed CNS toxoplasmosis, complicated by neurosyphilis, optic retinitis, neuritis, advanced HIV, severe dysphagia, esophageal candidiasis  He was treated with IV fluconazole , PEG tube placement for nutritional support, hospital course also showed progressive hyponatremia, due to SIADH was treated with hypertonic saline, IV Lasix , with gradually improving ecephalopathy, confusion,  Ever since then, he has frequent headaches, lives at home with his mother, taking Depakote  DR 250 mg 2 tablets twice a day as headache prevention, Imitrex  50 mg as needed  Over past 3 months, mother reported increased frequency of headache up to 4 times each week, and longer duration, less optimal response to Imitrex   There is no new focal signs, baseline gait abnormality, needing mother's help to keep up with his medication ambulate with a cane  He is a poor historian, described holoacranial headaches, but could not elaborate on more details,  Reviewed infectious disease Dr. Lindia evaluation on July 20, 2023, increased frequency of headache, and sleepiness, similar to prior presentation when CNS infection recurred, currently on Bactrim  for toxoplasmosis prophylaxis,  MRI brain w/wo in Oct 2024,  1. No acute intracranial abnormality. 2. Unchanged multifocal cerebellar encephalomalacia and gliosis, and multifocal subcortical hyperintense T2-weighted signal, likely sequelae of remote infection. 3. Age advanced volume loss, unchanged  Lab in 2024, RPR 1:16, normal CMP, CBC, HIV-2 RNA 20,  CD4 298, 14%  UPDATE Sep 12 2024: Lives at home with his mother, has two sister, given by one of his sister at today's visit, overall doing well, headache comes and goes, Imitrex  as needed works well,  use 4 to 5 tablets  every month  Tolerating Depakote  DR 500 mg 3 tablets every night  Lab July 2025: Positive RPR, T. pallidum antibody, history of treated neurosyphilis, HIV titer less than 20, continually improved with CD4 count 366   Update 10/16/24 SS: 3-day ambulatory EEG in July was normal.  Went to the ER earlier this week.  According to the ER note had a coughing episode that resulted in syncope. Here with his sister. On Monday he had coughing episode, he passed out, his eyes rolled back, no oral injury or incontinence. Sister reports spells of leaning, gets stuck, few times a year. If he were to be standing, would fall. His mother describes episodes where he falls out, out of no where, doesn't get oxygen to the brain. Normal spell described as him being fine, his whole body seize up, eyes roll back, confused afterwards. With Depakote , spells have been less severe. Mother adamant whatever is going on with him is not normal for him for the last 1-2 years.    PHYSICAL EXAM:   Vitals:   10/17/24 1315  BP: 127/84  Pulse: 74  SpO2: 95%  Weight: 219 lb 8 oz (99.6 kg)  Height: 5' 11 (1.803 m)   Physical Exam  General: The patient is alert and cooperative at the time of the examination.  Disheveled.  Neurologic Exam  Mental status: The patient is alert and oriented x 3 at the time of the examination. The patient has apparent normal recent and remote memory, with an apparently normal attention span and concentration ability.  Cranial nerves: Facial symmetry is present. Speech is normal, no aphasia or dysarthria is noted. Extraocular movements are full. Visual fields are full.  Motor: The patient has good strength in all 4 extremities.  Sensory examination: Soft touch sensation is symmetric on the face, arms, and legs.  Coordination: The patient has good finger-nose-finger bilaterally  Gait and station: The patient has a unsteady gait   REVIEW OF SYSTEMS:  Full 14 system review of systems  performed and notable only for as above All other review of systems were negative.   ALLERGIES: No Known Allergies  HOME MEDICATIONS: Current Outpatient Medications  Medication Sig Dispense Refill   acetaminophen  (TYLENOL ) 500 MG tablet Take 500 mg by mouth every 6 (six) hours as needed.     atorvastatin  (LIPITOR) 20 MG tablet Take 1 tablet (20 mg total) by mouth daily. 30 tablet 11   bictegravir-emtricitabine -tenofovir  AF (BIKTARVY ) 50-200-25 MG TABS tablet Take 1 tablet by mouth daily. 30 tablet 11   divalproex  (DEPAKOTE  ER) 500 MG 24 hr tablet Take 3 tablets (1,500 mg total) by mouth at bedtime. 270 tablet 3   etodolac  (LODINE ) 300 MG capsule Take 1 capsule (300 mg total) by mouth 2 (two) times daily. 20 capsule 0   fostemsavir tromethamine  (RUKOBIA ) 600 MG TB12 ER tablet Take 1 tablet (600 mg total) by mouth 2 (two) times daily. 60 tablet 11   hydrOXYzine  (ATARAX ) 25 MG tablet Take 1 tablet (25 mg total) by mouth at bedtime as needed (insomnia). 30 tablet 11   Menthol -Methyl Salicylate  (MUSCLE RUB) 10-15 % CREA Apply 1 application topically as needed for muscle pain.  0   ondansetron  (ZOFRAN ) 4 MG tablet Take 1 tablet (4 mg total) by mouth every 6 (six) hours as needed for nausea. 120 tablet 4   SUMAtriptan  (IMITREX ) 100 MG tablet May repeat in 2 hours if headache persists or recurs. 10 tablet 11   traMADol  (  ULTRAM ) 50 MG tablet Take 1-2 tablets (50-100 mg total) by mouth every 6 (six) hours as needed. 20 tablet 0   No current facility-administered medications for this visit.    PAST MEDICAL HISTORY: Past Medical History:  Diagnosis Date   Anemia    Anxiety    Ataxia 11/08/2021   Depression    GERD (gastroesophageal reflux disease)    Headache    Headache syndrome 02/04/2021   HIV infection (HCC)    HIV infection with neurological disease (HCC) 08/13/2021   Insomnia 01/26/2021   Seizure disorder (HCC) 01/17/2022   Sleepiness 07/20/2023   Substance abuse (HCC)    Vaccine  counseling 08/13/2021    PAST SURGICAL HISTORY: Past Surgical History:  Procedure Laterality Date   HIGH RESOLUTION ANOSCOPY N/A 11/29/2023   Procedure: ANOSCOPY, HIGH RESOLUTION;  Surgeon: Debby Hila, MD;  Location: [facility];  Service: General;  Laterality: N/A;   IR GASTROSTOMY TUBE MOD SED  09/02/2019   IR GASTROSTOMY TUBE REMOVAL  10/28/2019   RECTAL BIOPSY N/A 11/29/2023   Procedure: BIOPSY AND ABLATION;  Surgeon: Debby Hila, MD;  Location: [facility];  Service: General;  Laterality: N/A;   TOOTH EXTRACTION      FAMILY HISTORY: Family History  Problem Relation Age of Onset   Diabetes Neg Hx    CAD Neg Hx    Hypertension Neg Hx     SOCIAL HISTORY: Social History   Socioeconomic History   Marital status: Single    Spouse name: Not on file   Number of children: Not on file   Years of education: Not on file   Highest education level: Not on file  Occupational History   Not on file  Tobacco Use   Smoking status: Every Day    Current packs/day: 0.40    Average packs/day: 0.4 packs/day for 17.0 years (6.8 ttl pk-yrs)    Types: Cigarettes    Passive exposure: Past   Smokeless tobacco: Never  Vaping Use   Vaping status: Never Used  Substance and Sexual Activity   Alcohol use: Yes    Comment: occassionally   Drug use: Yes    Frequency: 7.0 times per week    Types: Marijuana    Comment: daily   Sexual activity: Not Currently    Partners: Male    Comment: declined condoms 09/15/20  Other Topics Concern   Not on file  Social History Narrative   Right Handed   Drinks 2-3 cups caffeine daily   Social Drivers of Health   Tobacco Use: High Risk (10/17/2024)   Patient History    Smoking Tobacco Use: Every Day    Smokeless Tobacco Use: Never    Passive Exposure: Past  Financial Resource Strain: Not on file  Food Insecurity: Not on file  Transportation Needs: Not on file  Physical Activity: Not on file  Stress: Not on file   Social Connections: Not on file  Intimate Partner Violence: Not on file  Depression (EYV7-0): Low Risk (09/07/2023)   Depression (PHQ2-9)    PHQ-2 Score: 0  Alcohol Screen: Not on file  Housing: Not on file  Utilities: Not on file  Health Literacy: Not on file    Modena Callander. M.D. Ph.D.  [facility]

## 2024-10-18 NOTE — Telephone Encounter (Signed)
 Forms completed and signed by Dr. Luiz.  Patient mother made aware and will pickup today. Copy placed in scan folder. Lorenda CHRISTELLA Code, RMA

## 2024-11-08 ENCOUNTER — Ambulatory Visit: Admitting: Infectious Disease

## 2024-11-11 ENCOUNTER — Other Ambulatory Visit

## 2025-09-11 ENCOUNTER — Ambulatory Visit: Admitting: Neurology
